# Patient Record
Sex: Male | Born: 1990 | Race: Black or African American | Hispanic: No | Marital: Single | Smoking: Current every day smoker
Health system: Southern US, Community
[De-identification: ages and names within clinical notes are randomized; demographics above are authoritative.]

## PROBLEM LIST (undated history)

## (undated) ENCOUNTER — Emergency Department (HOSPITAL_COMMUNITY): Admission: EM | Payer: Medicaid Other | Source: Home / Self Care

## (undated) DIAGNOSIS — E119 Type 2 diabetes mellitus without complications: Secondary | ICD-10-CM

## (undated) DIAGNOSIS — Z5900 Homelessness unspecified: Secondary | ICD-10-CM

## (undated) DIAGNOSIS — F32A Depression, unspecified: Secondary | ICD-10-CM

## (undated) DIAGNOSIS — F329 Major depressive disorder, single episode, unspecified: Secondary | ICD-10-CM

## (undated) DIAGNOSIS — F209 Schizophrenia, unspecified: Secondary | ICD-10-CM

## (undated) DIAGNOSIS — I10 Essential (primary) hypertension: Secondary | ICD-10-CM

## (undated) DIAGNOSIS — F419 Anxiety disorder, unspecified: Secondary | ICD-10-CM

---

## 2015-11-19 ENCOUNTER — Emergency Department
Admission: EM | Admit: 2015-11-19 | Discharge: 2015-11-19 | Disposition: A | Discharge status: Home / Self Care | Payer: Medicaid Other | Attending: Emergency Medicine | Admitting: Emergency Medicine

## 2015-11-19 ENCOUNTER — Encounter: Payer: Self-pay | Admitting: Emergency Medicine

## 2015-11-19 DIAGNOSIS — Z0000 Encounter for general adult medical examination without abnormal findings: Secondary | ICD-10-CM | POA: Diagnosis not present

## 2015-11-19 DIAGNOSIS — F329 Major depressive disorder, single episode, unspecified: Secondary | ICD-10-CM | POA: Insufficient documentation

## 2015-11-19 DIAGNOSIS — Z5181 Encounter for therapeutic drug level monitoring: Secondary | ICD-10-CM | POA: Diagnosis not present

## 2015-11-19 DIAGNOSIS — F17200 Nicotine dependence, unspecified, uncomplicated: Secondary | ICD-10-CM | POA: Diagnosis not present

## 2015-11-19 DIAGNOSIS — Z139 Encounter for screening, unspecified: Secondary | ICD-10-CM

## 2015-11-19 DIAGNOSIS — R45851 Suicidal ideations: Secondary | ICD-10-CM | POA: Diagnosis present

## 2015-11-19 HISTORY — DX: Depression, unspecified: F32.A

## 2015-11-19 HISTORY — DX: Major depressive disorder, single episode, unspecified: F32.9

## 2015-11-19 LAB — URINE DRUG SCREEN, QUALITATIVE (ARMC ONLY)
Amphetamines, Ur Screen: NOT DETECTED
BARBITURATES, UR SCREEN: NOT DETECTED
BENZODIAZEPINE, UR SCRN: NOT DETECTED
CANNABINOID 50 NG, UR ~~LOC~~: NOT DETECTED
COCAINE METABOLITE, UR ~~LOC~~: NOT DETECTED
MDMA (Ecstasy)Ur Screen: NOT DETECTED
METHADONE SCREEN, URINE: NOT DETECTED
Opiate, Ur Screen: NOT DETECTED
Phencyclidine (PCP) Ur S: NOT DETECTED
TRICYCLIC, UR SCREEN: NOT DETECTED

## 2015-11-19 LAB — ETHANOL: Alcohol, Ethyl (B): <5 mg/dL (ref <5)

## 2015-11-19 LAB — CBC
HCT: 45.7 % (ref 40.0–52.0)
HEMOGLOBIN: 15.3 g/dL (ref 13.0–18.0)
MCH: 23.8 pg — AB (ref 26.0–34.0)
MCHC: 33.4 g/dL (ref 32.0–36.0)
MCV: 71.3 fL — AB (ref 80.0–100.0)
Platelets: 232 10*3/uL (ref 150–440)
RBC: 6.41 MIL/uL — AB (ref 4.40–5.90)
RDW: 15.2 % — ABNORMAL HIGH (ref 11.5–14.5)
WBC: 8.1 10*3/uL (ref 3.8–10.6)

## 2015-11-19 LAB — COMPREHENSIVE METABOLIC PANEL
ALBUMIN: 4.3 g/dL (ref 3.5–5.0)
ALT: 43 U/L (ref 17–63)
AST: 37 U/L (ref 15–41)
Alkaline Phosphatase: 43 U/L (ref 38–126)
Anion gap: 9 (ref 5–15)
BUN: 18 mg/dL (ref 6–20)
CHLORIDE: 102 mmol/L (ref 101–111)
CO2: 25 mmol/L (ref 22–32)
CREATININE: 0.82 mg/dL (ref 0.61–1.24)
Calcium: 9.5 mg/dL (ref 8.9–10.3)
GFR calc Af Amer: >60 mL/min (ref >60)
GFR calc non Af Amer: >60 mL/min (ref >60)
Glucose, Bld: 99 mg/dL (ref 65–99)
Potassium: 3.9 mmol/L (ref 3.5–5.1)
SODIUM: 136 mmol/L (ref 135–145)
Total Bilirubin: 0.6 mg/dL (ref 0.3–1.2)
Total Protein: 7.6 g/dL (ref 6.5–8.1)

## 2015-11-19 LAB — SALICYLATE LEVEL: Salicylate Lvl: <4 mg/dL (ref 2.8–30.0)

## 2015-11-19 LAB — ACETAMINOPHEN LEVEL: Acetaminophen (Tylenol), Serum: <10 ug/mL — ABNORMAL LOW (ref 10–30)

## 2015-11-19 NOTE — ED Notes (Signed)
Karn PicklerSenetha Jones from patient's group home is here to see if patient is going to be d/c soon enough so she could just take him home now.  We told her the social work consult could vary in the amount of time it takes and she would be better to go home instead of waiting.  She also said he has been a resident there since June 19th, not yesterday like the patient told MD and myself.  MD was present and told this information.

## 2015-11-19 NOTE — ED Notes (Signed)

## 2015-11-19 NOTE — ED Notes (Signed)
SOC set up and room ED21 and patient moved in there for the consult.

## 2015-11-19 NOTE — ED Notes (Signed)

## 2015-11-19 NOTE — ED Notes (Signed)
Patient's group home was called, they should be here in 30 minutes.

## 2015-11-19 NOTE — ED Notes (Signed)
Case worker at bedside.

## 2015-11-19 NOTE — ED Notes (Signed)
Patient dressing back into his clothes and waiting on his bed for his transportation to arrive.

## 2015-11-19 NOTE — ED Notes (Signed)
Gave patient meal bag

## 2015-11-19 NOTE — ED Notes (Signed)
SOC complete and patient went back to bed [facility]

## 2015-11-19 NOTE — ED Notes (Signed)
MD at bedside.  Patient states he was just placed in [facility] last night and he does not like it there.  He states he feels it is mostly older people and he does not like that.  Pt denies SI/HI and is behavior appropriate during initial encounter.

## 2015-11-19 NOTE — BH Assessment (Signed)
Assessment Note  John SellsJoshua Copeland is an 25 y.o. male who presents to the ER, due to calling 911 and telling them he was suicidal. He admits it was lie and he only said it, so he can leave the group home. He states, he moved into the home approximately month ago and don't like it. The staff haven't done anything to him that would cause him not to like it. He just prefer the previous group home, over the current one.  He reports of having being hospitalized in the past, due to similar complaint. He acknowledge it was a lie as well. "I just wanted to get out of the assisted living home."  Patient currently denies SI/HI and AV/H.  Past Medical History:  Past Medical History  Diagnosis Date  . Depression     History reviewed. No pertinent past surgical history.  Family History: No family history on file.  Social History:  reports that he has been smoking.  He does not have any smokeless tobacco history on file. He reports that he does not drink alcohol or use illicit drugs.  Additional Social History:  Alcohol / Drug Use Pain Medications: See PTA Prescriptions: See PTA Over the Counter: See PTA History of alcohol / drug use?: No history of alcohol / drug abuse Longest period of sobriety (when/how long): No past or current use of mind altering substances Negative Consequences of Use:  (No past or current use) Withdrawal Symptoms:  (No past or current use)  CIWA: CIWA-Ar BP: 128/61 mmHg Pulse Rate: 96 COWS:    Allergies: No Known Allergies  Home Medications:  (Not in a hospital admission)  OB/GYN Status:  No LMP for male patient.  General Assessment Data [facility] TTS Assessment: In system Is this a Tele or Face-to-Face Assessment?: Face-to-Face Is this an Initial Assessment or a Re-assessment for this encounter?: Initial Assessment Marital status: Single Maiden name: n/a Is patient pregnant?: No Pregnancy Status: No Living Arrangements: Group Home ([facility]) Can pt return to current living arrangement?: Yes Admission Status: Voluntary Is patient capable of signing voluntary admission?: Yes Referral Source: Self/Family/Friend Insurance type: Medicaid  Medical Screening Exam [facility] ONLY) Medical Exam completed: Yes  Crisis Care Plan Living Arrangements: Group Home ([facility]) Legal Guardian: Other: [facility]([place] [facility]) Name of Psychiatrist: "I don't remember the name" Name of Therapist: "I don't remember the name"  Education Status Is patient currently in school?: No Current Grade: n/a Highest grade of school patient has completed: 9th Grade Name of school: n/a Contact person: n/a  Risk to self with the past 6 months Suicidal Ideation: No Has patient been a risk to self within the past 6 months prior to admission? : No Suicidal Intent: No Has patient had any suicidal intent within the past 6 months prior to admission? : No Is patient at risk for suicide?: No Suicidal Plan?: No Has patient had any suicidal plan within the past 6 months prior to admission? : No Access to Means: No What has been your use of drugs/alcohol within the last 12 months?: Reports of none Previous Attempts/Gestures: No How many times?: 0 Other Self Harm Risks: Reports of none Triggers for Past Attempts: None known Intentional Self Injurious Behavior: None Family Suicide History: No Recent stressful life event(s): Other (Comment) (Reports of none) Persecutory voices/beliefs?: No Depression: No Depression Symptoms:  (Reports of none) Substance abuse history and/or treatment for substance abuse?: No Suicide prevention information given to non-admitted patients:  Not applicable  Risk to Others within the past 6 months Homicidal Ideation: No Does patient have any lifetime risk of violence toward others beyond the six months prior to admission? : No Thoughts of Harm to Others: No Current Homicidal Intent:  No Current Homicidal Plan: No Access to Homicidal Means: No Identified Victim: Reports of none History of harm to others?: No Assessment of Violence: None Noted Violent Behavior Description: Reports of none Does patient have access to weapons?: No Criminal Charges Pending?: No Does patient have a court date: No Is patient on probation?: No  Psychosis Hallucinations: None noted Delusions: None noted  Mental Status Report Appearance/Hygiene: In scrubs, Unremarkable, In hospital gown Eye Contact: Good Motor Activity: Freedom of movement, Unremarkable Speech: Logical/coherent, Unremarkable Level of Consciousness: Alert Mood: Pleasant Affect: Appropriate to circumstance Anxiety Level: None Thought Processes: Coherent, Relevant Judgement: Unimpaired Orientation: Person, Time, Place, Situation, Appropriate for developmental age Obsessive Compulsive Thoughts/Behaviors: Minimal  Cognitive Functioning Concentration: Normal Memory: Recent Intact, Remote Intact IQ: Average Insight: Fair Impulse Control: Fair Appetite: Good Weight Loss: 0 Weight Gain: 0 Sleep: No Change Total Hours of Sleep: 8 Vegetative Symptoms: None  ADLScreening [facility]) Patient's cognitive ability adequate to safely complete daily activities?: Yes Patient able to express need for assistance with ADLs?: No Independently performs ADLs?: Yes (appropriate for developmental age)  Prior Inpatient Therapy Prior Inpatient Therapy: Yes Prior Therapy Dates: 2016 Prior Therapy Facilty/Provider(s): [facility] Reason for Treatment: "I lied and said I wanted to kill myself so I can get out of the assisted living home."  Prior Outpatient Therapy Prior Outpatient Therapy: Yes Prior Therapy Dates: "I don't remember" Prior Therapy Facilty/Provider(s): "I don't remember" Reason for Treatment: "I don't remember" Does patient have an ACCT team?: No Does patient have Intensive In-House Services?   : No Does patient have [facility] services? : No Does patient have P4CC services?: No  ADL Screening (condition at time of admission) Patient's cognitive ability adequate to safely complete daily activities?: Yes Is the patient deaf or have difficulty hearing?: No Does the patient have difficulty seeing, even when wearing glasses/contacts?: No Does the patient have difficulty concentrating, remembering, or making decisions?: No Patient able to express need for assistance with ADLs?: No Does the patient have difficulty dressing or bathing?: Yes Independently performs ADLs?: Yes (appropriate for developmental age) Weakness of Legs: None Weakness of Arms/Hands: None  Home Assistive Devices/Equipment Home Assistive Devices/Equipment: None  Therapy Consults (therapy consults require a physician order) PT Evaluation Needed: No OT Evalulation Needed: No SLP Evaluation Needed: No Abuse/Neglect Assessment (Assessment to be complete while patient is alone) Physical Abuse: Denies Verbal Abuse: Denies Sexual Abuse: Denies Exploitation of patient/patient's resources: Denies Self-Neglect: Denies Values / Beliefs Cultural Requests During Hospitalization: None Spiritual Requests During Hospitalization: None Consults Spiritual Care Consult Needed: No Social Work Consult Needed: No      Additional Information 1:1 In Past 12 Months?: No CIRT Risk: No Elopement Risk: No Does patient have medical clearance?: Yes  Child/Adolescent Assessment Running Away Risk: Denies (Patient is an adult)  Disposition:  Disposition Initial Assessment Completed for this Encounter: Yes Disposition of Patient: Other Pensions consultant discussed patient with ER MD (Dr. Pershing Proud). Patient will discharge back to Group Home and follow up with his current outpatient provider.   On Site Evaluation by:   Reviewed with Physician:    Lilyan Gilford MS, LCAS, LPC, NCC, CCSI Therapeutic Triage  Specialist 11/19/2015 9:10 PM

## 2015-11-19 NOTE — ED Notes (Signed)
Patient given meal from dietary, no straws or utensils found. 

## 2015-11-19 NOTE — ED Provider Notes (Signed)
[facility] Emergency Department Provider Note   ____________________________________________  Time seen: Approximately 3:20 PM  I have reviewed the triage vital signs and the nursing notes.   HISTORY  Chief Complaint Suicidal   HPI John Copeland is a 25 y.o. male with a history of depression who is presenting to the emergency department today requesting to return to his own group home. His initial chief complaint was for suicidal ideation. However, when I asked the patient what is bringing him to the emergency department today he says that he is not suicidal. Denies any plan. Denies homicidal ideation. Has no medical complaints at this time. He says that he was just switched to a new group home yesterday and he says he doesn't like it because there are too many old people there. He says that he would like to return to his old group home and says he has already spoken to the owner of the new group home about this.Denies any hallucinations. Denies any substance abuse.   Past Medical History  Diagnosis Date  . Depression     There are no active problems to display for this patient.   History reviewed. No pertinent past surgical history.  No current outpatient prescriptions on file.  Allergies Review of patient's allergies indicates no known allergies.  No family history on file.  Social History Social History  Substance Use Topics  . Smoking status: Current Every Day Smoker  . Smokeless tobacco: None  . Alcohol Use: No    Review of Systems Constitutional: No fever/chills Eyes: No visual changes. ENT: No sore throat. Cardiovascular: Denies chest pain. Respiratory: Denies shortness of breath. Gastrointestinal: No abdominal pain.  No nausea, no vomiting.  No diarrhea.  No constipation. Genitourinary: Negative for dysuria. Musculoskeletal: Negative for back pain. Skin: Negative for rash. Neurological: Negative for headaches, focal weakness  or numbness.  10-point ROS otherwise negative.  ____________________________________________   PHYSICAL EXAM:  VITAL SIGNS: ED Triage Vitals  Enc Vitals Group     BP 11/19/15 1445 128/61 mmHg     Pulse Rate 11/19/15 1445 96     Resp 11/19/15 1445 18     Temp 11/19/15 1445 98 F (36.7 C)     Temp Source 11/19/15 1445 Oral     SpO2 11/19/15 1445 98 %     Weight 11/19/15 1445 235 lb (106.595 kg)     Height 11/19/15 1445 5\' 11"  (1.803 m)     Head Cir --      Peak Flow --      Pain Score 11/19/15 1439 0     Pain Loc --      Pain Edu? --      Excl. in GC? --     Constitutional: Alert and oriented. Well appearing and in no acute distress. Eyes: Conjunctivae are normal. PERRL. EOMI. Head: Atraumatic. Nose: No congestion/rhinnorhea. Mouth/Throat: Mucous membranes are moist.  Neck: No stridor.   Cardiovascular: Normal rate, regular rhythm. Grossly normal heart sounds.   Respiratory: Normal respiratory effort.  No retractions. Lungs CTAB. Gastrointestinal: Soft and nontender. No distention. no CVA tenderness. Musculoskeletal: No lower extremity tenderness nor edema.  No joint effusions. Neurologic:  Normal speech and language. No gross focal neurologic deficits are appreciated. Skin:  Skin is warm, dry and intact. No rash noted. Psychiatric: Mood and affect are normal. Speech and behavior are normal.  ____________________________________________   LABS (all labs ordered are listed, but only abnormal results are displayed)  Labs Reviewed  ACETAMINOPHEN LEVEL - Abnormal; Notable for the following:    Acetaminophen (Tylenol), Serum <10 (*)    All other components within normal limits  CBC - Abnormal; Notable for the following:    RBC 6.41 (*)    MCV 71.3 (*)    MCH 23.8 (*)    RDW 15.2 (*)    All other components within normal limits  COMPREHENSIVE METABOLIC PANEL  ETHANOL  SALICYLATE LEVEL  URINE DRUG SCREEN, QUALITATIVE ([facility] ONLY)    ____________________________________________  EKG   ____________________________________________  RADIOLOGY   ____________________________________________   PROCEDURES   Procedures  ____________________________________________   INITIAL IMPRESSION / ASSESSMENT AND PLAN / ED COURSE  Pertinent labs & imaging results that were available during my care of the patient were reviewed by me and considered in my medical decision making (see chart for details).  The patient is calm, reasonably and clinically sober. I will not involuntarily commit him. He does not appear to be in a state of psychosis. Discussed the case with Olivia Mackie, the behavioral health intake, who recommends discussing case with social work. Social work consult ordered.  ----------------------------------------- 9:31 PM on 11/19/2015 -----------------------------------------  Patient resting comfortably and has been evaluated by the specialist on call who recommends discharge to home without need for any psychiatric workup further in the emergency department. Also seen by TTS arrange transport back to the patient's group home. I discussed this with the patient that we cannot change his group home placement for the emergency department. He is understanding of this. He denies any suicidal or homicidal ideation at this time. He understands that he must continue this conversation with the group home staff that he would like to continue to try to switch his placement. ____________________________________________   FINAL CLINICAL IMPRESSION(S) / ED DIAGNOSES  Medical screening exam.    NEW MEDICATIONS STARTED DURING THIS VISIT:  New Prescriptions   No medications on file     Note:  This document was prepared using Dragon voice recognition software and may include unintentional dictation errors.    Myrna Blazer, MD 11/19/15 [phone]

## 2015-11-19 NOTE — ED Notes (Signed)
Pt to ed with c/o suicidal thought since yesterday.  Denies plan.  Denies HI.  Reports hx of admission for same.

## 2015-11-19 NOTE — ED Notes (Signed)
Patients group home called and asked how much longer until this pt is d/c.  I told them the recommendations so far were to d/c him tonight but that I am unable to provide a time.  I was given a contact named Tobi Bastosnna to call @ [phone] when d/c was near.

## 2015-11-19 NOTE — BH Assessment (Signed)
Writer contacted Group Home Owner [phone]([phone]) and they are aware patient is up for discharge. Writer informed ER MD (Dr. Pershing ProudSchaevitz) and patient's nurse Onalee Hua(David) they will pick him up within 30 minutes.

## 2015-11-19 NOTE — ED Notes (Signed)
Patient transportation has arrived, escorting him up front.

## 2015-11-29 ENCOUNTER — Emergency Department
Admission: EM | Admit: 2015-11-29 | Discharge: 2015-11-30 | Disposition: A | Discharge status: Home / Self Care | Payer: Medicaid Other | Attending: Emergency Medicine | Admitting: Emergency Medicine

## 2015-11-29 ENCOUNTER — Encounter: Payer: Self-pay | Admitting: Emergency Medicine

## 2015-11-29 DIAGNOSIS — F17210 Nicotine dependence, cigarettes, uncomplicated: Secondary | ICD-10-CM | POA: Insufficient documentation

## 2015-11-29 DIAGNOSIS — R45851 Suicidal ideations: Secondary | ICD-10-CM | POA: Diagnosis present

## 2015-11-29 DIAGNOSIS — F250 Schizoaffective disorder, bipolar type: Secondary | ICD-10-CM | POA: Diagnosis not present

## 2015-11-29 DIAGNOSIS — F329 Major depressive disorder, single episode, unspecified: Secondary | ICD-10-CM | POA: Insufficient documentation

## 2015-11-29 DIAGNOSIS — E119 Type 2 diabetes mellitus without complications: Secondary | ICD-10-CM | POA: Diagnosis not present

## 2015-11-29 DIAGNOSIS — Z79899 Other long term (current) drug therapy: Secondary | ICD-10-CM | POA: Diagnosis not present

## 2015-11-29 HISTORY — DX: Type 2 diabetes mellitus without complications: E11.9

## 2015-11-29 LAB — COMPREHENSIVE METABOLIC PANEL
ALK PHOS: 38 U/L (ref 38–126)
ALT: 31 U/L (ref 17–63)
ANION GAP: 6 (ref 5–15)
AST: 23 U/L (ref 15–41)
Albumin: 3.9 g/dL (ref 3.5–5.0)
BILIRUBIN TOTAL: 0.3 mg/dL (ref 0.3–1.2)
BUN: 16 mg/dL (ref 6–20)
CALCIUM: 9.1 mg/dL (ref 8.9–10.3)
CO2: 26 mmol/L (ref 22–32)
Chloride: 105 mmol/L (ref 101–111)
Creatinine, Ser: 0.72 mg/dL (ref 0.61–1.24)
GFR calc non Af Amer: >60 mL/min (ref >60)
Glucose, Bld: 120 mg/dL — ABNORMAL HIGH (ref 65–99)
Potassium: 3.9 mmol/L (ref 3.5–5.1)
Sodium: 137 mmol/L (ref 135–145)
TOTAL PROTEIN: 7.1 g/dL (ref 6.5–8.1)

## 2015-11-29 LAB — URINE DRUG SCREEN, QUALITATIVE (ARMC ONLY)
Amphetamines, Ur Screen: NOT DETECTED
BARBITURATES, UR SCREEN: NOT DETECTED
BENZODIAZEPINE, UR SCRN: NOT DETECTED
CANNABINOID 50 NG, UR ~~LOC~~: NOT DETECTED
Cocaine Metabolite,Ur ~~LOC~~: NOT DETECTED
MDMA (Ecstasy)Ur Screen: NOT DETECTED
METHADONE SCREEN, URINE: NOT DETECTED
OPIATE, UR SCREEN: NOT DETECTED
Phencyclidine (PCP) Ur S: NOT DETECTED
TRICYCLIC, UR SCREEN: NOT DETECTED

## 2015-11-29 LAB — CBC
HEMATOCRIT: 44.2 % (ref 40.0–52.0)
Hemoglobin: 14.5 g/dL (ref 13.0–18.0)
MCH: 24 pg — ABNORMAL LOW (ref 26.0–34.0)
MCHC: 32.7 g/dL (ref 32.0–36.0)
MCV: 73.4 fL — ABNORMAL LOW (ref 80.0–100.0)
Platelets: 224 10*3/uL (ref 150–440)
RBC: 6.03 MIL/uL — ABNORMAL HIGH (ref 4.40–5.90)
RDW: 15.4 % — AB (ref 11.5–14.5)
WBC: 8.9 10*3/uL (ref 3.8–10.6)

## 2015-11-29 LAB — ACETAMINOPHEN LEVEL

## 2015-11-29 LAB — ETHANOL: Alcohol, Ethyl (B): <5 mg/dL (ref <5)

## 2015-11-29 LAB — SALICYLATE LEVEL

## 2015-11-29 NOTE — ED Notes (Signed)
Labs and ua sent to lab.  Pt provided a meal tray and drink.

## 2015-11-29 NOTE — ED Provider Notes (Signed)
[facility] Emergency Department Provider Note  ____________________________________________  Time seen: Approximately 8:24 PM  I have reviewed the triage vital signs and the nursing notes.   HISTORY  Chief Complaint Suicidal    HPI John Copeland is a 25 y.o. male who has a legal guardian and lives at a group home who presents for evaluation of suicidal ideation.  He is stating that he wants to kill himself and would do so by hanging with a belt.  He states that he feels this way because there is someone that works at a group home that is been giving him a hard time.  He presented with the same complaints of wanting to die about 10 days ago.  During that evaluation he admitted to the post on-call as well as to the intake counselor that he was lying so that he get a break from his group home.  He also stated that during at least one prior hospitalization for psychiatric reasons he was lying at that time as well and that he never intended to kill himself.  I pointed this out and tonight and he said "tonight is different, I mean it.".  Per the group home, he reportedly tried to run away a few days ago after having something not go his way.  The acting out behavior is apparently a chronic issue with him.  He denies fever/chills, chest pain, shortness of breath, nausea, vomiting, diarrhea, abdominal pain  Past Medical History  Diagnosis Date  . Depression   . Diabetes mellitus without complication (HCC)     There are no active problems to display for this patient.   History reviewed. No pertinent past surgical history.  Current Outpatient Rx  Name  Route  Sig  Dispense  Refill  . Canagliflozin-Metformin HCl (INVOKAMET) [phone] MG TABS   Oral   Take 1 tablet by mouth 2 (two) times daily.         . diphenhydrAMINE (BENADRYL) 25 MG tablet   Oral   Take 25 mg by mouth every 4 (four) hours as needed.         . divalproex (DEPAKOTE ER) 500 MG 24 hr  tablet   Oral   Take 1,500 mg by mouth at bedtime.         . haloperidol (HALDOL) 5 MG tablet   Oral   Take 5 mg by mouth at bedtime.         Marland Kitchen losartan (COZAAR) 50 MG tablet   Oral   Take 50 mg by mouth daily.         . Multiple Vitamin (THEREMS PO)   Oral   Take 1 tablet by mouth daily.         . pravastatin (PRAVACHOL) 40 MG tablet   Oral   Take 40 mg by mouth daily.         . Vitamin D, Ergocalciferol, (DRISDOL) 50000 units CAPS capsule   Oral   Take 50,000 Units by mouth every 7 (seven) days.           Allergies Review of patient's allergies indicates no known allergies.  No family history on file.  Social History Social History  Substance Use Topics  . Smoking status: Current Every Day Smoker -- 0.50 packs/day    Types: Cigarettes  . Smokeless tobacco: None  . Alcohol Use: No    Review of Systems Constitutional: No fever/chills Eyes: No visual changes. ENT: No sore throat. Cardiovascular: Denies chest pain. Respiratory:  Denies shortness of breath. Gastrointestinal: No abdominal pain.  No nausea, no vomiting.  No diarrhea.  No constipation. Genitourinary: Negative for dysuria. Musculoskeletal: Negative for back pain. Skin: Negative for rash. Neurological: Negative for headaches, focal weakness or numbness. Psych:  Suicidal ideation with a plan (pain by belt) 10-point ROS otherwise negative.  ____________________________________________   PHYSICAL EXAM:  VITAL SIGNS: ED Triage Vitals  Enc Vitals Group     BP 11/29/15 1859 143/80 mmHg     Pulse Rate 11/29/15 1859 93     Resp 11/29/15 1859 16     Temp 11/29/15 1859 98.7 F (37.1 C)     Temp Source 11/29/15 1859 Oral     SpO2 11/29/15 1859 97 %     Weight 11/29/15 1859 232 lb (105.235 kg)     Height 11/29/15 1859 5\' 11"  (1.803 m)     Head Cir --      Peak Flow --      Pain Score 11/29/15 1900 0     Pain Loc --      Pain Edu? --      Excl. in GC? --     Constitutional: Alert  and oriented. Well appearing and in no acute distress. Eyes: Conjunctivae are normal. PERRL. EOMI. Head: Atraumatic. Nose: No congestion/rhinnorhea. Mouth/Throat: Mucous membranes are moist.  Oropharynx non-erythematous. Neck: No stridor.  No meningeal signs.   Cardiovascular: Normal rate, regular rhythm. Good peripheral circulation. Grossly normal heart sounds.   Respiratory: Normal respiratory effort.  No retractions. Lungs CTAB. Gastrointestinal: Soft and nontender. No distention.  Musculoskeletal: No lower extremity tenderness nor edema. No gross deformities of extremities. Neurologic:  Normal speech and language. No gross focal neurologic deficits are appreciated.  Skin:  Skin is warm, dry and intact. No rash noted. Psychiatric: Mood and affect are normal. Speech is normal.  He endorses active suicidality at the plan of hanging by belt.  He chronically has limited insight and judgment and a limited ability to make decisions.  ____________________________________________   LABS (all labs ordered are listed, but only abnormal results are displayed)  Labs Reviewed  COMPREHENSIVE METABOLIC PANEL - Abnormal; Notable for the following:    Glucose, Bld 120 (*)    All other components within normal limits  ACETAMINOPHEN LEVEL - Abnormal; Notable for the following:    Acetaminophen (Tylenol), Serum <10 (*)    All other components within normal limits  CBC - Abnormal; Notable for the following:    RBC 6.03 (*)    MCV 73.4 (*)    MCH 24.0 (*)    RDW 15.4 (*)    All other components within normal limits  ETHANOL  SALICYLATE LEVEL  URINE DRUG SCREEN, QUALITATIVE ([facility] ONLY)   ____________________________________________  EKG  None ____________________________________________  RADIOLOGY   No results found.  ____________________________________________   PROCEDURES  Procedure(s) performed:   Procedures   ____________________________________________   INITIAL  IMPRESSION / ASSESSMENT AND PLAN / ED COURSE  Pertinent labs & imaging results that were available during my care of the patient were reviewed by me and considered in my medical decision making (see chart for details).  I strongly suspect the patient is once again acting out.  However we know that he has limited insight and judgment and he has the capacity to act out a potentially harm himself as demonstrated by him attempting to run away to [place] within the last week.  This may represent an escalation of his behavior.  At this time the  patient wants to be here and I will keep involuntary since he has been through this process several times in the past.  I will, however, consult TTS further evaluation as well.   ____________________________________________  FINAL CLINICAL IMPRESSION(S) / ED DIAGNOSES  Final diagnoses:  Suicidal ideation     MEDICATIONS GIVEN DURING THIS VISIT:  Medications - No data to display   NEW OUTPATIENT MEDICATIONS STARTED DURING THIS VISIT:  New Prescriptions   No medications on file      Note:  This document was prepared using Dragon voice recognition software and may include unintentional dictation errors.   Loleta Roseory Aldora Perman, MD 11/30/15 [phone]

## 2015-11-29 NOTE — ED Notes (Signed)
Pt in via EMS with suicidal ideation; pt states, "I want to kill myself."  When asked about plan, pt states, "Im going to hang myself with my belt."  Pt currently within sight of behavioral quad sitter and ODS.  Pt dressed out by Annette StableBill, RN, all personal belongings placed in pt belonging bags and are currently at nurses station.   Pt A/Ox4, vitals WDL, no immediate distress at this time.

## 2015-11-29 NOTE — ED Notes (Signed)
Attempted to contact pt legal guardian, Jennette BankerMarty Purington to notify of pt whereabouts and to give him password.  No answer, voicemail left at this time to give us a call at his earliest convenience.

## 2015-11-29 NOTE — ED Notes (Signed)
John Copeland, Interior and spatial designerdirector of [facility] here to check on pt.  Mr John PateeRichmond reports that pt has hx of similar behavior as well as elopement when things do not go his way.  Reported that today pt was upset in regards to the specific outing that was scheduled today because he wanted to do go else where.

## 2015-11-29 NOTE — BH Assessment (Signed)
Assessment Note  John SellsJoshua Copeland is an 25 y.o. male. [name] arrived to the ED by way of EMS. He reports that "I want to kill myself".  He states that he has been feeling this way all day.  He states that for the last few days he has been feeling like going through with it.  He reports that he would hang himself with his belt from a tree.  He reports a history of suicidal attempts(once before).  He denied having homicidal ideation or intent.  He reports symptoms of depression.  He shared that he feels bad when things are not going his way.  He reports that he has been sleeping more, isolating himself, and has not been finding enjoyment in things he usually enjoys.  He reports that he has been feeing anxious about getting a job and going to school.  He denied having auditory or visual hallucinations.  He reports stress from group home staff, and being disrespected by staff.  He denied the use of alcohol or drugs.  Legal guardian John HamburgerMarty Copeland - [place] [facility] [phone].  Diagnosis: SI  Past Medical History:  Past Medical History  Diagnosis Date  . Depression   . Diabetes mellitus without complication (HCC)     History reviewed. No pertinent past surgical history.  Family History: No family history on file.  Social History:  reports that he has been smoking Cigarettes.  He has been smoking about 0.50 packs per day. He does not have any smokeless tobacco history on file. He reports that he does not drink alcohol or use illicit drugs.  Additional Social History:  Alcohol / Drug Use History of alcohol / drug use?: No history of alcohol / drug abuse  CIWA: CIWA-Ar BP: (!) 143/80 mmHg Pulse Rate: 93 COWS:    Allergies: No Known Allergies  Home Medications:  (Not in a hospital admission)  OB/GYN Status:  No LMP for male patient.  General Assessment Data [facility] Assessment: In system Is this a Tele or Face-to-Face Assessment?: Face-to-Face Is this an  Initial Assessment or a Re-assessment for this encounter?: Initial Assessment Marital status: Single Maiden name: n/a Is patient pregnant?: No Pregnancy Status: No Living Arrangements: Group Home ([facility]) Can pt return to current living arrangement?: Yes Admission Status: Voluntary Is patient capable of signing voluntary admission?: No Referral Source: Self/Family/Friend Insurance type: Medicaid  Medical Screening Exam [facility] Walk-[facility] ONLY) Medical Exam completed: Yes  Crisis Care Plan Living Arrangements: Group Home ([facility]) Legal Guardian: Other: John Copeland(John Copeland - [place] [facility] [phone]) Name of Psychiatrist: "I don't know his name" (he denied knowing the name of the agency as well) Name of Therapist: "I don't know, I'm not sure"  Education Status Is patient currently in school?: No Current Grade: n/a Highest grade of school patient has completed: 8th Name of school: [employer] Contact person: n/a  Risk to self with the past 6 months Suicidal Ideation: Yes-Currently Present Has patient been a risk to self within the past 6 months prior to admission? : Yes Suicidal Intent: Yes-Currently Present Has patient had any suicidal intent within the past 6 months prior to admission? : Yes Is patient at risk for suicide?: No Suicidal Plan?: Yes-Currently Present Has patient had any suicidal plan within the past 6 months prior to admission? : Yes Specify Current Suicidal Plan: Sherri RadHang himself with his belt from a tree Access to Means: No (Currently in the  hospital) What has been your use of drugs/alcohol within the last 12 months?: denied use Previous Attempts/Gestures: Yes How many times?: 1 Other Self Harm Risks: denied Triggers for Past Attempts: Unknown Intentional Self Injurious Behavior: None Family Suicide History: No Recent stressful life event(s): Other (Comment) (Problems with group home  staff) Persecutory voices/beliefs?: No Depression: Yes Depression Symptoms: Isolating Substance abuse history and/or treatment for substance abuse?: No Suicide prevention information given to non-admitted patients: Not applicable  Risk to Others within the past 6 months Homicidal Ideation: No Does patient have any lifetime risk of violence toward others beyond the six months prior to admission? : No Thoughts of Harm to Others: No Current Homicidal Intent: No Current Homicidal Plan: No Access to Homicidal Means: No Identified Victim: None identified History of harm to others?: No Assessment of Violence: None Noted Violent Behavior Description: denied Does patient have access to weapons?: No Criminal Charges Pending?: No Does patient have a court date: No Is patient on probation?: No  Psychosis Hallucinations: None noted Delusions: None noted  Mental Status Report Appearance/Hygiene: In scrubs, Unremarkable Eye Contact: Fair Motor Activity: Unremarkable Speech: Logical/coherent Level of Consciousness: Alert Mood: Pleasant Affect: Appropriate to circumstance Anxiety Level: None Thought Processes: Coherent Judgement: Unimpaired Orientation: Person, Place, Time, Situation Obsessive Compulsive Thoughts/Behaviors: None  Cognitive Functioning Concentration: Normal Memory: Recent Intact IQ: Average Insight: Poor Impulse Control: Fair Appetite: Good Sleep: Increased Vegetative Symptoms: None  ADLScreening [facility] Assessment Services) Patient's cognitive ability adequate to safely complete daily activities?: Yes Patient able to express need for assistance with ADLs?: Yes Independently performs ADLs?: Yes (appropriate for developmental age)  Prior Inpatient Therapy Prior Inpatient Therapy: Yes Prior Therapy Dates: 2016 Prior Therapy Facilty/Provider(s): [facility], [facility] Reason for Treatment: Overdose, suicidal thoughts  Prior Outpatient Therapy Prior Outpatient  Therapy: Yes Prior Therapy Dates: Current Prior Therapy Facilty/Provider(s): -Could not remember the name of the psychiatrist Reason for Treatment: Depression, SI Does patient have an ACCT team?: No Does patient have Intensive In-House Services?  : No Does patient have [facility] services? : No Does patient have P4CC services?: No  ADL Screening (condition at time of admission) Patient's cognitive ability adequate to safely complete daily activities?: Yes Patient able to express need for assistance with ADLs?: Yes Independently performs ADLs?: Yes (appropriate for developmental age)       Abuse/Neglect Assessment (Assessment to be complete while patient is alone) Physical Abuse: Denies Verbal Abuse: Denies Sexual Abuse: Denies Exploitation of patient/patient's resources: Denies Self-Neglect: Denies     [employer]chant [employer] officer (For Healthcare) Does patient have an advance directive?: No Would patient like information on creating an advanced directive?: No - patient declined information    Additional Information 1:1 In Past 12 Months?: No CIRT Risk: No Elopement Risk: No Does patient have medical clearance?: Yes     Disposition:  Disposition Initial Assessment Completed for this Encounter: Yes Disposition of Patient: Other dispositions  On Site Evaluation by:   Reviewed with Physician:    Justice Deeds 11/29/2015 10:13 PM

## 2015-11-30 DIAGNOSIS — F250 Schizoaffective disorder, bipolar type: Secondary | ICD-10-CM
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
## 2015-11-30 NOTE — ED Notes (Signed)
Patient resting quietly in room. No noted distress or abnormal behaviors noted. Will continue 15 minute checks and observation by security camera for safety. 
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
## 2015-11-30 NOTE — ED Notes (Signed)
Patient guardian John Copeland [phone]) called asking about an update of the patient's status. He also mentioned that the patient frequently calls [phone] voicing suicidal ideation at his current group home and that he will be able to return to the home.

## 2015-11-30 NOTE — ED Notes (Signed)
Patient denies SI/HI/AVH and pain. ALl belongings returned to patient. Discharge instructions reviewed with patient. Patient discharged in care of group home.

## 2015-11-30 NOTE — Discharge Instructions (Signed)
Please seek medical attention and help for any thoughts about wanting to harm herself, harm others, any concerning change in behavior, severe depression, inappropriate drug use or any other new or concerning symptoms. ° °

## 2015-11-30 NOTE — ED Notes (Signed)
Patient asleep in room. No noted distress or abnormal behavior. Will continue 15 minute checks and observation by security cameras for safety. 

## 2015-11-30 NOTE — ED Notes (Signed)

## 2015-11-30 NOTE — ED Notes (Signed)
Patient came to nurse and asked if he could speak to a Child psychotherapistsocial worker. After speaking with his guardian, he decided that he wanted to switch guardians because he had to go back to the same group home. Nurse asked social worker to speak with patient. Awaiting social work recommendations.

## 2015-11-30 NOTE — ED Notes (Signed)
Patient currently in dayroom speaking with DSS representative. No signs of acute distress noted at this time. Maintained on 15 minute checks and observation by security camera for safety.

## 2015-11-30 NOTE — Consult Note (Signed)
[facility] Consult   Reason for Consult:  Consult for 25 year old man who had himself brought voluntarily to the emergency room stating that he was having suicidal thoughts and wanted to get out of his group home. Referring Physician:  Cinda Quest Patient Identification: John Copeland MRN:  [id number] Principal Diagnosis: Schizoaffective disorder [facility]) Diagnosis:   Patient Active Problem List   Diagnosis Date Noted  . Schizoaffective disorder ([place]) [F25.9] 11/30/2015    Total Time spent with patient: 1 hour  Subjective:   John Copeland is a 25 y.o. male patient admitted with "you help me get a new place to live".  HPI:  Patient interviewed. Chart reviewed. Case reviewed with TTS and emergency room physician. 25 year old man came to the emergency room stating that he was having suicidal ideation. He says that his mood is feeling depressed. He blames his depression on things that he dislikes at his group home. He says that they don't treat him respectfully and he generally doesn't like it. He wants to be moved back into an assisted living facility. He says that he's been sleeping a lot. Eating normally. He denies any hallucinations or psychotic symptoms. He states that he is compliant with his outpatient medicine regularly. He did not actually act on any attempt to harm himself or harm anyone else. Denies that he's been abusing any drugs or alcohol.  Substance abuse history: Patient denies any current or past substance abuse history.  Social history: Patient is originally from the [place] area. He has a legal guardian. [place] to a group home here in our community. Patient says he dislikes the group home because it is boring and they are not meeting his needs of wanting to go to school and try to get a job.  Medical history: He denies any medical problems outside  psychiatric. Based on his medications he may have a history of dyslipidemia and high blood pressure.  Past Psychiatric History: Patient has had 1 previous visit recently to our emergency room. At that time he also presented with suicidal ideation but admitted that he was making up the story in order to get out of his group home. Patient claims that he has cut himself in the past. Denies any other suicide attempts. Has had psychiatric hospitalization but he says he doesn't remember the details. He knows he is taking Depakote currently but can't remember any other medication  Risk to Self: Suicidal Ideation: Yes-Currently Present Suicidal Intent: Yes-Currently Present Is patient at risk for suicide?: No Suicidal Plan?: Yes-Currently Present Specify Current Suicidal Plan: Elbert Ewings himself with his belt from a tree Access to Means: No (Currently in the hospital) What has been your use of drugs/alcohol within the last 12 months?: denied use How many times?: 1 Other Self Harm Risks: denied Triggers for Past Attempts: Unknown Intentional Self Injurious Behavior: None Risk to Others: Homicidal Ideation: No Thoughts of Harm to Others: No Current Homicidal Intent: No Current Homicidal Plan: No Access to Homicidal Means: No Identified Victim: None identified History of harm to others?: No Assessment of Violence: None Noted Violent Behavior Description: denied Does patient have access to weapons?: No Criminal Charges Pending?: No Does patient have a court date: No Prior Inpatient Therapy: Prior Inpatient Therapy: Yes Prior Therapy Dates: 2016 Prior Therapy Facilty/Provider(s): [facility], [facility] Reason for Treatment: Overdose, suicidal thoughts Prior Outpatient Therapy: Prior Outpatient Therapy: Yes Prior Therapy Dates:  Current Prior Therapy Facilty/Provider(s): -Could not remember the name of the psychiatrist Reason for Treatment: Depression, SI Does patient have an ACCT team?: No Does  patient have Intensive In-House Services?  : No Does patient have [facility] services? : No Does patient have P4CC services?: No  Past Medical History:  Past Medical History  Diagnosis Date  . Depression   . Diabetes mellitus without complication (HCC)    History reviewed. No pertinent past surgical history. Family History: No family history on file. Family Psychiatric  History: He denies any family history of mental illness Social History:  History  Alcohol Use No     History  Drug Use No    Social History   Social History  . Marital Status: Single    Spouse Name: N/A  . Number of Children: N/A  . Years of Education: N/A   Social History Main Topics  . Smoking status: Current Every Day Smoker -- 0.50 packs/day    Types: Cigarettes  . Smokeless tobacco: None  . Alcohol Use: No  . Drug Use: No  . Sexual Activity: Not Asked   Other Topics Concern  . None   Social History Narrative   Additional Social History:    Allergies:  No Known Allergies  Labs:  Results for orders placed or performed during the hospital encounter of 11/29/15 (from the past 48 hour(s))  Comprehensive metabolic panel     Status: Abnormal   Collection Time: 11/29/15  7:30 PM  Result Value Ref Range   Sodium 137 135 - 145 mmol/L   Potassium 3.9 3.5 - 5.1 mmol/L   Chloride 105 101 - 111 mmol/L   CO2 26 22 - 32 mmol/L   Glucose, Bld 120 (H) 65 - 99 mg/dL   BUN 16 6 - 20 mg/dL   Creatinine, Ser 9.65 0.61 - 1.24 mg/dL   Calcium 9.1 8.9 - 44.1 mg/dL   Total Protein 7.1 6.5 - 8.1 g/dL   Albumin 3.9 3.5 - 5.0 g/dL   AST 23 15 - 41 U/L   ALT 31 17 - 63 U/L   Alkaline Phosphatase 38 38 - 126 U/L   Total Bilirubin 0.3 0.3 - 1.2 mg/dL   GFR calc non Af Amer >60 >60 mL/min   GFR calc Af Amer >60 >60 mL/min    Comment: (NOTE) The eGFR has been calculated using the CKD EPI equation. This calculation has not been validated in all clinical situations. eGFR's persistently <60 mL/min signify possible  Chronic Kidney Disease.    Anion gap 6 5 - 15  Ethanol     Status: None   Collection Time: 11/29/15  7:30 PM  Result Value Ref Range   Alcohol, Ethyl (B) <5 <5 mg/dL    Comment:        LOWEST DETECTABLE LIMIT FOR SERUM ALCOHOL IS 5 mg/dL FOR MEDICAL PURPOSES ONLY   Salicylate level     Status: None   Collection Time: 11/29/15  7:30 PM  Result Value Ref Range   Salicylate Lvl <4.0 2.8 - 30.0 mg/dL  Acetaminophen level     Status: Abnormal   Collection Time: 11/29/15  7:30 PM  Result Value Ref Range   Acetaminophen (Tylenol), Serum <10 (L) 10 - 30 ug/mL    Comment:        THERAPEUTIC CONCENTRATIONS VARY SIGNIFICANTLY. A RANGE OF 10-30 ug/mL MAY BE AN EFFECTIVE CONCENTRATION FOR MANY PATIENTS. HOWEVER, SOME ARE BEST TREATED AT CONCENTRATIONS OUTSIDE THIS RANGE. ACETAMINOPHEN CONCENTRATIONS >150 ug/mL  AT 4 HOURS AFTER INGESTION AND >50 ug/mL AT 12 HOURS AFTER INGESTION ARE OFTEN ASSOCIATED WITH TOXIC REACTIONS.   cbc     Status: Abnormal   Collection Time: 11/29/15  7:30 PM  Result Value Ref Range   WBC 8.9 3.8 - 10.6 K/uL   RBC 6.03 (H) 4.40 - 5.90 MIL/uL   Hemoglobin 14.5 13.0 - 18.0 g/dL   HCT 44.2 40.0 - 52.0 %   MCV 73.4 (L) 80.0 - 100.0 fL   MCH 24.0 (L) 26.0 - 34.0 pg   MCHC 32.7 32.0 - 36.0 g/dL   RDW 15.4 (H) 11.5 - 14.5 %   Platelets 224 150 - 440 K/uL  Urine Drug Screen, Qualitative     Status: None   Collection Time: 11/29/15  7:30 PM  Result Value Ref Range   Tricyclic, Ur Screen NONE DETECTED NONE DETECTED   Amphetamines, Ur Screen NONE DETECTED NONE DETECTED   MDMA (Ecstasy)Ur Screen NONE DETECTED NONE DETECTED   Cocaine Metabolite,Ur [place] NONE DETECTED NONE DETECTED   Opiate, Ur Screen NONE DETECTED NONE DETECTED   Phencyclidine (PCP) Ur S NONE DETECTED NONE DETECTED   Cannabinoid 50 Ng, Ur [place] NONE DETECTED NONE DETECTED   Barbiturates, Ur Screen NONE DETECTED NONE DETECTED   Benzodiazepine, Ur Scrn NONE DETECTED NONE DETECTED   Methadone Scn, Ur  NONE DETECTED NONE DETECTED    Comment: (NOTE) 606  Tricyclics, urine               Cutoff 1000 ng/mL 200  Amphetamines, urine             Cutoff 1000 ng/mL 300  MDMA (Ecstasy), urine           Cutoff 500 ng/mL 400  Cocaine Metabolite, urine       Cutoff 300 ng/mL 500  Opiate, urine                   Cutoff 300 ng/mL 600  Phencyclidine (PCP), urine      Cutoff 25 ng/mL 700  Cannabinoid, urine              Cutoff 50 ng/mL 800  Barbiturates, urine             Cutoff 200 ng/mL 900  Benzodiazepine, urine           Cutoff 200 ng/mL 1000 Methadone, urine                Cutoff 300 ng/mL 1100 1200 The urine drug screen provides only a preliminary, unconfirmed 1300 analytical test result and should not be used for non-medical 1400 purposes. Clinical consideration and professional judgment should 1500 be applied to any positive drug screen result due to possible 1600 interfering substances. A more specific alternate chemical method 1700 must be used in order to obtain a confirmed analytical result.  1800 Gas chromato graphy / mass spectrometry (GC/MS) is the preferred 1900 confirmatory method.     No current facility-administered medications for this encounter.   Current Outpatient Prescriptions  Medication Sig Dispense Refill  . Canagliflozin-Metformin HCl (INVOKAMET) [phone] MG TABS Take 1 tablet by mouth 2 (two) times daily.    . diphenhydrAMINE (BENADRYL) 25 MG tablet Take 25 mg by mouth every 4 (four) hours as needed.    . divalproex (DEPAKOTE ER) 500 MG 24 hr tablet Take 1,500 mg by mouth at bedtime.    . haloperidol (HALDOL) 5 MG tablet Take 5 mg by mouth at bedtime.    Marland Kitchen  losartan (COZAAR) 50 MG tablet Take 50 mg by mouth daily.    . Multiple Vitamin (THEREMS PO) Take 1 tablet by mouth daily.    . pravastatin (PRAVACHOL) 40 MG tablet Take 40 mg by mouth daily.    . Vitamin D, Ergocalciferol, (DRISDOL) 50000 units CAPS capsule Take 50,000 Units by mouth every 7 (seven) days.       Musculoskeletal: Strength & Muscle Tone: within normal limits Gait & Station: normal Patient leans: N/A  Psychiatric Specialty Exam: Physical Exam  Nursing note and vitals reviewed. Constitutional: He appears well-developed and well-nourished.  HENT:  Head: Normocephalic and atraumatic.  Eyes: Conjunctivae are normal. Pupils are equal, round, and reactive to light.  Neck: Normal range of motion.  Cardiovascular: Normal heart sounds.   Respiratory: Effort normal. No respiratory distress.  GI: Soft.  Musculoskeletal: Normal range of motion.  Neurological: He is alert.  Skin: Skin is warm and dry.  Psychiatric: His behavior is normal. His affect is blunt. His speech is delayed. Cognition and memory are impaired. He expresses impulsivity and inappropriate judgment. He expresses suicidal ideation.    Review of Systems  Constitutional: Negative.   HENT: Negative.   Eyes: Negative.   Respiratory: Negative.   Cardiovascular: Negative.   Gastrointestinal: Negative.   Musculoskeletal: Negative.   Skin: Negative.   Neurological: Negative.   Psychiatric/Behavioral: Positive for suicidal ideas. Negative for depression, hallucinations, memory loss and substance abuse. The patient is not nervous/anxious and does not have insomnia.     Blood pressure 143/80, pulse 93, temperature 98.7 F (37.1 C), temperature source Oral, resp. rate 16, height _0  (1.803 m), weight 105.235 kg (232 lb), SpO2 97 %.Body mass index is 32.37 kg/(m^2).  General Appearance: Well Groomed  Eye Contact:  Minimal  Speech:  Normal Rate  Volume:  Decreased  Mood:  Depressed  Affect:  Appropriate and Full Range  Thought Process:  Goal Directed  Orientation:  Full (Time, Place, and Person)  Thought Content:  Logical  Suicidal Thoughts:  Yes.  without intent/plan  Homicidal Thoughts:  No  Memory:  Immediate;   Good Recent;   Poor Remote;   Poor  Judgement:  Impaired  Insight:  Present  Psychomotor  Activity:  Normal  Concentration:  Concentration: Fair  Recall:  [employer] of Knowledge:  Fair  Language:  Fair  Akathisia:  Negative  Handed:  Right  AIMS (if indicated):     Assets:  Desire for Improvement Financial Resources/Insurance Housing Physical Health Resilience  ADL's:  Intact  Cognition:  WNL  Sleep:        Treatment Plan Summary: Plan 25 year old man with uncertain past psychiatric history although he says he has been diagnosed with schizoaffective disorder. Currently he is saying that he is suicidal but everything else about how he is acting and behaving suggests a strong positive focus on the future. He states that he wants to go to school and get a job and live independently. He mostly seems to be irritated at his current group home and how he feels they are holding him back. He has a past history of having malingered suicidal ideation to try to get a new group home more living facility and he is once again completely focused on that. He has not been behaving in an agitated or threatening manner and has been taking care of himself well in the emergency room. Patient is unlikely to be an acute risk to himself and unlikely to require inpatient hospital  level treatment. Tried to counsel him about appropriate ways to try and work on getting his needs met. No change to medicine. Case reviewed with the ER and TTS. Patient can be discharged at the discretion of the ER and will follow-up with his local mental health provider.  Disposition: Patient does not meet criteria for psychiatric inpatient admission. Supportive therapy provided about ongoing stressors.  Alethia Berthold, MD 11/30/2015 3:53 PM

## 2015-11-30 NOTE — ED Notes (Signed)
ENVIRONMENTAL ASSESSMENT Potentially harmful objects out of patient reach: Yes Personal belongings secured: Yes Patient dressed in hospital provided attire only: Yes Plastic bags out of patient reach: Yes Patient care equipment (cords, cables, call bells, lines, and drains) shortened, removed, or accounted for: Yes Equipment and supplies removed from bottom of stretcher: Yes Potentially toxic materials out of patient reach: Yes Sharps container removed or out of patient reach: Yes  Patient is currently in room sleeping. No signs of acute distress noted. Maintained on 15 minute checks and observation by security camera for safety.
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
## 2015-11-30 NOTE — ED Provider Notes (Signed)
-----------------------------------------   8:37 AM on 11/30/2015 -----------------------------------------   Blood pressure 143/80, pulse 93, temperature 98.7 F (37.1 C), temperature source Oral, resp. rate 16, height 5\' 11"  (1.803 m), weight 232 lb (105.235 kg), SpO2 97 %.  The patient had no acute events since last update.  Calm and cooperative at this time.  Disposition is pending per Psychiatry/Behavioral Medicine team recommendations.     Arnaldo NatalPaul F Dreydon Cardenas, MD 11/30/15 [phone]

## 2015-11-30 NOTE — ED Notes (Signed)
Report was received from Billey Changharles W., RN; Pt. Verbalizes no complaints or distress; verbalizes having S.I.; with a plan to hang himself with a belt; Hi. Continue to monitor with 15 min. Monitoring.
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
## 2016-10-02 ENCOUNTER — Encounter: Payer: Self-pay | Admitting: Emergency Medicine

## 2016-10-02 ENCOUNTER — Emergency Department
Admission: EM | Admit: 2016-10-02 | Discharge: 2016-10-05 | Disposition: A | Discharge status: Home / Self Care | Payer: Medicaid Other | Attending: Student in an Organized Health Care Education/Training Program | Admitting: Student in an Organized Health Care Education/Training Program

## 2016-10-02 DIAGNOSIS — R461 Bizarre personal appearance: Secondary | ICD-10-CM | POA: Diagnosis not present

## 2016-10-02 DIAGNOSIS — Z5181 Encounter for therapeutic drug level monitoring: Secondary | ICD-10-CM | POA: Insufficient documentation

## 2016-10-02 DIAGNOSIS — R462 Strange and inexplicable behavior: Secondary | ICD-10-CM

## 2016-10-02 DIAGNOSIS — E119 Type 2 diabetes mellitus without complications: Secondary | ICD-10-CM | POA: Insufficient documentation

## 2016-10-02 DIAGNOSIS — Z111 Encounter for screening for respiratory tuberculosis: Secondary | ICD-10-CM

## 2016-10-02 DIAGNOSIS — F250 Schizoaffective disorder, bipolar type: Secondary | ICD-10-CM | POA: Diagnosis present

## 2016-10-02 DIAGNOSIS — R4689 Other symptoms and signs involving appearance and behavior: Secondary | ICD-10-CM | POA: Diagnosis present

## 2016-10-02 LAB — URINE DRUG SCREEN, QUALITATIVE (ARMC ONLY)
AMPHETAMINES, UR SCREEN: NOT DETECTED
BARBITURATES, UR SCREEN: NOT DETECTED
BENZODIAZEPINE, UR SCRN: NOT DETECTED
COCAINE METABOLITE, UR ~~LOC~~: NOT DETECTED
Cannabinoid 50 Ng, Ur ~~LOC~~: NOT DETECTED
MDMA (Ecstasy)Ur Screen: NOT DETECTED
METHADONE SCREEN, URINE: NOT DETECTED
Opiate, Ur Screen: NOT DETECTED
Phencyclidine (PCP) Ur S: NOT DETECTED
TRICYCLIC, UR SCREEN: NOT DETECTED

## 2016-10-02 LAB — COMPREHENSIVE METABOLIC PANEL
ALK PHOS: 46 U/L (ref 38–126)
ALT: 33 U/L (ref 17–63)
ANION GAP: 8 (ref 5–15)
AST: 87 U/L — ABNORMAL HIGH (ref 15–41)
Albumin: 4.8 g/dL (ref 3.5–5.0)
BUN: 16 mg/dL (ref 6–20)
CALCIUM: 9.9 mg/dL (ref 8.9–10.3)
CO2: 29 mmol/L (ref 22–32)
CREATININE: 1.17 mg/dL (ref 0.61–1.24)
Chloride: 101 mmol/L (ref 101–111)
Glucose, Bld: 67 mg/dL (ref 65–99)
Potassium: 4.2 mmol/L (ref 3.5–5.1)
SODIUM: 138 mmol/L (ref 135–145)
Total Bilirubin: 0.6 mg/dL (ref 0.3–1.2)
Total Protein: 8.1 g/dL (ref 6.5–8.1)

## 2016-10-02 LAB — URINALYSIS, COMPLETE (UACMP) WITH MICROSCOPIC
Glucose, UA: NEGATIVE mg/dL
Hgb urine dipstick: NEGATIVE
Ketones, ur: 5 mg/dL — AB
Leukocytes, UA: NEGATIVE
NITRITE: NEGATIVE
Protein, ur: NEGATIVE mg/dL
SPECIFIC GRAVITY, URINE: 1.024 (ref 1.005–1.030)
SQUAMOUS EPITHELIAL / LPF: NONE SEEN
pH: 5 (ref 5.0–8.0)

## 2016-10-02 LAB — CBC
HCT: 43.1 % (ref 40.0–52.0)
HEMOGLOBIN: 13.9 g/dL (ref 13.0–18.0)
MCH: 23.6 pg — AB (ref 26.0–34.0)
MCHC: 32.2 g/dL (ref 32.0–36.0)
MCV: 73.5 fL — AB (ref 80.0–100.0)
Platelets: 255 10*3/uL (ref 150–440)
RBC: 5.87 MIL/uL (ref 4.40–5.90)
RDW: 14.4 % (ref 11.5–14.5)
WBC: 10.9 10*3/uL — ABNORMAL HIGH (ref 3.8–10.6)

## 2016-10-02 LAB — ETHANOL: Alcohol, Ethyl (B): <5 mg/dL (ref <5)

## 2016-10-02 MED ORDER — LORAZEPAM 1 MG PO TABS
1.0000 mg | ORAL_TABLET | Freq: Once | ORAL | Status: AC
  Administered 2016-10-02: 1 mg via ORAL
  Filled 2016-10-02: qty 1

## 2016-10-02 MED ORDER — HALOPERIDOL 5 MG PO TABS
5.0000 mg | ORAL_TABLET | Freq: Every day | ORAL | Status: DC
  Administered 2016-10-02 – 2016-10-03 (×2): 5 mg via ORAL
  Filled 2016-10-02 (×2): qty 1

## 2016-10-02 MED ORDER — DIVALPROEX SODIUM ER 500 MG PO TB24
1500.0000 mg | ORAL_TABLET | Freq: Every day | ORAL | Status: DC
  Administered 2016-10-02 – 2016-10-04 (×3): 1500 mg via ORAL
  Filled 2016-10-02 (×3): qty 3

## 2016-10-02 NOTE — ED Triage Notes (Signed)
Pt brought under IVC from [place] [employer] from group home.
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
## 2016-10-02 NOTE — ED Notes (Addendum)
Group Home = [facility] [phone]).

## 2016-10-02 NOTE — ED Notes (Signed)
Patient is asking staff to pray for him.  He is asking for a prayer for "freedom."  He is asking for a prayer to discharge today so he can make it to his job interview in the morning.  He is also asking for a prayer that he receives the correct medication along with a prompt return of his wallet and belongings.

## 2016-10-02 NOTE — ED Notes (Signed)
Patient brought [place] by [place] [employer] after [facility].  Per PD, patient is schizophrenic and has a fixed delusion that another resident stole his wallet.  Patient has been given 30 day eviction letter from Group Home due to his aggressive nature with multiple residents.

## 2016-10-02 NOTE — ED Notes (Signed)
Pt given cup of water 

## 2016-10-02 NOTE — ED Notes (Signed)
Pt is restless.  Constantly asking for drinks, OJ, blankets, fruit, yogurt, candy and gum.  Wants to speak to a pastor so he can discuss the Old Testament.  Pt given a copy of the New testament, but he wants to discuss the Old Testament with the [place].  Pt in and out of bathroom, pacing the room.  Pt is asking for a print out of his picture with all his info ()ss #, pic, address etc) for identification for his job interview in the morning.

## 2016-10-02 NOTE — ED Notes (Signed)
Patient dressed out by security officer [place]ilton [place] and [place] [employer]. In patient belonging bag there is pair of black pants, blue button up shirt a pair of black socks, one pair of black tennis shoes.  Multiple packages of cold medicine and one bottle of cold medication. Patient placed in bed 20 hallway.

## 2016-10-03 DIAGNOSIS — F250 Schizoaffective disorder, bipolar type: Secondary | ICD-10-CM

## 2016-10-03 MED ORDER — LORAZEPAM 2 MG PO TABS
2.0000 mg | ORAL_TABLET | Freq: Once | ORAL | Status: AC
  Administered 2016-10-03: 2 mg via ORAL
  Filled 2016-10-03: qty 1

## 2016-10-03 MED ORDER — HALOPERIDOL 5 MG PO TABS
5.0000 mg | ORAL_TABLET | Freq: Once | ORAL | Status: AC
  Administered 2016-10-03: 5 mg via ORAL
  Filled 2016-10-03: qty 1

## 2016-10-03 NOTE — ED Notes (Signed)
Pt concerned about an interview he had scheduled for today. Writer informed pt that it was unlikely to be discharged on time, to make the interview at 11:00AM. Pt handled information well. Writer allowed pt to call [employer] to make arrangements.

## 2016-10-03 NOTE — Progress Notes (Signed)
Patient had incontinent episode of urine.  Patient questioning discharge by 11am so he can go to his interview at [employer].

## 2016-10-03 NOTE — ED Notes (Signed)
Pt calm and cooperative however continues to voice " I don't want to be here I'm ready to go home". Pt reported he does not know why he is here. RN educated pt about ED psych unit. Pt denies current SI, HI, a/v hallucinations. Pt appears to be religiously preoccupied he asked for phone for a "payer request" he called an unknown number and started to pray repeatedly on phone. Pt stated on phone, " I want God to supernaturally get me out of here" and continued to mumble prayers. Pt was given breakfast and snacks. No aggressive behaviors noted at this time. Will continue to monitor for safety.

## 2016-10-03 NOTE — ED Provider Notes (Signed)
  Physical Exam  BP 130/87 (BP Location: Left Arm)   Pulse 96   Temp 98.1 F (36.7 C) (Oral)   Resp 18   Ht 6' (1.829 m)   Wt 104.3 kg (230 lb)   SpO2 98%   BMI 31.19 kg/m   Physical Exam  ED Course  Procedures  MDM Patient seen by Dr. Toni Amendlapacs. IVC rescinded. He recommend dc back to group home. Stable for dc.       John Copeland, David Hsienta, MD 10/03/16 [phone]

## 2016-10-03 NOTE — Consult Note (Signed)
[place] [facility] Consult   Reason for Consult:  Consult for 26 year old man with a history of schizophrenia or schizoaffective disorder brought to the emergency room last night by law enforcement Referring Physician:  Joni Fears Patient Identification: John Copeland MRN:  [id number] Principal Diagnosis: Schizoaffective disorder [facility]) Diagnosis:   Patient Active Problem List   Diagnosis Date Noted  . Schizoaffective disorder ([place]) [F25.9] 11/30/2015    Total Time spent with patient: 1 hour  Subjective:   John Copeland is a 26 y.o. male patient admitted with "the police brought me here".  HPI:  Patient interviewed. Chart reviewed. This is a 26 year old man with chronic mental illness. Law enforcement brought him here because of his odd behavior. Apparently yesterday afternoon or evening he ran through town to get to the [employer] claiming that he wanted to report one of the other residents at his group home for allegedly stealing his wallet. He was acting bizarre and agitated and police thought that he was unstable so they brought him over here. Patient says that this is in fact true he had gone over to the magistrate wanting to report his wallet being stolen. He says that he thinks that another resident at the group home stole it because they are jealous of him. However, he denies having any thought about hurting the other resident or hurting anyone at all and denies any suicidal thoughts. He says that his mood generally has been okay. Denies problems with sleep or appetite. Denies any hallucinations recently. He says that he has been compliant with his prescribed psychiatric medicine and not using any drugs or alcohol. When asked about the wallet situation he says that he will go back to the group home and look for it and if he is not able to find it he will go through appropriate channels to get new identification. Absolutely denies any intention to hurt anybody. He is  willing to admit that it's possible that the wallet was not stolen and that the person he blames didn't steal it. He hasn't been any particular behavior problems since being here in the emergency room. He says he's been back at this group home for just about 2 weeks now. Claims that he likes it.  Medical history: Past history reportedly of diabetes although not clear that he takes any medicine or gets treated for now and he denies any other medical problems.  Substance abuse history: Patient denies any history of alcohol or drug abuse is nothing in the record about it.  Social history: He is originally from the [place] area but has some relatives in [place] as well. He's been [place] the [place] couple years in group homes.  Past Psychiatric History: Patient has a history of schizoaffective disorder. He denies ever trying to hurt himself or being violent with anyone else. We've seen him before in the emergency room here where he was making threatening statements although on that occasion it turned out that it was being done manipulatively just to get out of his group home. It doesn't sound like we have any evidence that he is ever actually been harmful in the past.  Risk to Self: Suicidal Ideation: No Suicidal Intent: No Is patient at risk for suicide?: No Suicidal Plan?: No Access to Means: No What has been your use of drugs/alcohol within the last 12 months?: None Reported How many times?: 0 Other Self Harm Risks: None Reported Triggers for Past Attempts: None  known Intentional Self Injurious Behavior: None Risk to Others: Homicidal Ideation: No Thoughts of Harm to Others: No Current Homicidal Intent: No Current Homicidal Plan: No Access to Homicidal Means: No Identified Victim: N/A History of harm to others?: No Assessment of Violence: None Noted Violent Behavior Description: None Reported Does patient have access to  weapons?: No Criminal Charges Pending?: No Does patient have a court date: No Prior Inpatient Therapy: Prior Inpatient Therapy: No ([facility] - Pt reports "I don't know") Prior Outpatient Therapy: Prior Outpatient Therapy: No Does patient have an ACCT team?: No Does patient have Intensive In-House Services?  : No Does patient have [facility] services? : No Does patient have P4CC services?: No  Past Medical History:  Past Medical History:  Diagnosis Date  . Depression   . Diabetes mellitus without complication ([place])    No past surgical history on file. Family History: No family history on file. Family Psychiatric  History: Patient says he thinks that his mother had bipolar disorder. Social History:  History  Alcohol Use No     History  Drug Use No    Social History   Social History  . Marital status: Single    Spouse name: N/A  . Number of children: N/A  . Years of education: N/A   Social History Main Topics  . Smoking status: Current Every Day Smoker    Packs/day: 0.50    Types: Cigarettes  . Smokeless tobacco: None  . Alcohol use No  . Drug use: No  . Sexual activity: Not Asked   Other Topics Concern  . None   Social History Narrative  . None   Additional Social History:    Allergies:  No Known Allergies  Labs:  Results for orders placed or performed during the hospital encounter of 10/02/16 (from the past 48 hour(s))  CBC     Status: Abnormal   Collection Time: 10/02/16  8:37 PM  Result Value Ref Range   WBC 10.9 (H) 3.8 - 10.6 K/uL   RBC 5.87 4.40 - 5.90 MIL/uL   Hemoglobin 13.9 13.0 - 18.0 g/dL   HCT 43.1 40.0 - 52.0 %   MCV 73.5 (L) 80.0 - 100.0 fL   MCH 23.6 (L) 26.0 - 34.0 pg   MCHC 32.2 32.0 - 36.0 g/dL   RDW 14.4 11.5 - 14.5 %   Platelets 255 150 - 440 K/uL  Comprehensive metabolic panel     Status: Abnormal   Collection Time: 10/02/16  8:37 PM  Result Value Ref Range   Sodium 138 135 - 145 mmol/L   Potassium 4.2 3.5 - 5.1 mmol/L   Chloride 101  101 - 111 mmol/L   CO2 29 22 - 32 mmol/L   Glucose, Bld 67 65 - 99 mg/dL   BUN 16 6 - 20 mg/dL   Creatinine, Ser 1.17 0.61 - 1.24 mg/dL   Calcium 9.9 8.9 - 10.3 mg/dL   Total Protein 8.1 6.5 - 8.1 g/dL   Albumin 4.8 3.5 - 5.0 g/dL   AST 87 (H) 15 - 41 U/L   ALT 33 17 - 63 U/L   Alkaline Phosphatase 46 38 - 126 U/L   Total Bilirubin 0.6 0.3 - 1.2 mg/dL   GFR calc non Af Amer >60 >60 mL/min   GFR calc Af Amer >60 >60 mL/min    Comment: (NOTE) The eGFR has been calculated using the CKD EPI equation. This calculation has not been validated in all clinical situations. eGFR's persistently <60 mL/min  signify possible Chronic Kidney Disease.    Anion gap 8 5 - 15  Ethanol     Status: None   Collection Time: 10/02/16  8:37 PM  Result Value Ref Range   Alcohol, Ethyl (B) <5 <5 mg/dL    Comment:        LOWEST DETECTABLE LIMIT FOR SERUM ALCOHOL IS 5 mg/dL FOR MEDICAL PURPOSES ONLY   Urinalysis, Complete w Microscopic     Status: Abnormal   Collection Time: 10/02/16  8:37 PM  Result Value Ref Range   Color, Urine YELLOW (A) YELLOW   APPearance CLEAR (A) CLEAR   Specific Gravity, Urine 1.024 1.005 - 1.030   pH 5.0 5.0 - 8.0   Glucose, UA NEGATIVE NEGATIVE mg/dL   Hgb urine dipstick NEGATIVE NEGATIVE   Bilirubin Urine SMALL (A) NEGATIVE   Ketones, ur 5 (A) NEGATIVE mg/dL   Protein, ur NEGATIVE NEGATIVE mg/dL   Nitrite NEGATIVE NEGATIVE   Leukocytes, UA NEGATIVE NEGATIVE   RBC / HPF 0-5 0 - 5 RBC/hpf   WBC, UA 0-5 0 - 5 WBC/hpf   Bacteria, UA RARE (A) NONE SEEN   Squamous Epithelial / LPF NONE SEEN NONE SEEN  Urine Drug Screen, Qualitative ([facility] only)     Status: None   Collection Time: 10/02/16  8:37 PM  Result Value Ref Range   Tricyclic, Ur Screen NONE DETECTED NONE DETECTED   Amphetamines, Ur Screen NONE DETECTED NONE DETECTED   MDMA (Ecstasy)Ur Screen NONE DETECTED NONE DETECTED   Cocaine Metabolite,Ur [place] NONE DETECTED NONE DETECTED   Opiate, Ur Screen NONE DETECTED NONE  DETECTED   Phencyclidine (PCP) Ur S NONE DETECTED NONE DETECTED   Cannabinoid 50 Ng, Ur [place] NONE DETECTED NONE DETECTED   Barbiturates, Ur Screen NONE DETECTED NONE DETECTED   Benzodiazepine, Ur Scrn NONE DETECTED NONE DETECTED   Methadone Scn, Ur NONE DETECTED NONE DETECTED    Comment: (NOTE) 270  Tricyclics, urine               Cutoff 1000 ng/mL 200  Amphetamines, urine             Cutoff 1000 ng/mL 300  MDMA (Ecstasy), urine           Cutoff 500 ng/mL 400  Cocaine Metabolite, urine       Cutoff 300 ng/mL 500  Opiate, urine                   Cutoff 300 ng/mL 600  Phencyclidine (PCP), urine      Cutoff 25 ng/mL 700  Cannabinoid, urine              Cutoff 50 ng/mL 800  Barbiturates, urine             Cutoff 200 ng/mL 900  Benzodiazepine, urine           Cutoff 200 ng/mL 1000 Methadone, urine                Cutoff 300 ng/mL 1100 1200 The urine drug screen provides only a preliminary, unconfirmed 1300 analytical test result and should not be used for non-medical 1400 purposes. Clinical consideration and professional judgment should 1500 be applied to any positive drug screen result due to possible 1600 interfering substances. A more specific alternate chemical method 1700 must be used in order to obtain a confirmed analytical result.  1800 Gas chromato graphy / mass spectrometry (GC/MS) is the preferred 1900 confirmatory method.     Current  Facility-Administered Medications  Medication Dose Route Frequency Provider Last Rate Last Dose  . divalproex (DEPAKOTE ER) 24 hr tablet 1,500 mg  1,500 mg Oral QHS Merlyn Lot, MD   1,500 mg at 10/02/16 2313  . haloperidol (HALDOL) tablet 5 mg  5 mg Oral QHS Merlyn Lot, MD   5 mg at 10/02/16 2313   Current Outpatient Prescriptions  Medication Sig Dispense Refill  . atorvastatin (LIPITOR) 10 MG tablet Take 10 mg by mouth at bedtime.    . divalproex (DEPAKOTE ER) 500 MG 24 hr tablet Take 500 mg by mouth at bedtime.     . haloperidol  (HALDOL) 10 MG tablet Take 10 mg by mouth daily.     Marland Kitchen lisinopril (PRINIVIL,ZESTRIL) 10 MG tablet Take 10 mg by mouth daily.    . metFORMIN (GLUCOPHAGE) 500 MG tablet Take 1,000 mg by mouth 2 (two) times daily with a meal.    . Multiple Vitamin (THEREMS PO) Take 1 tablet by mouth daily.    . Vitamin D, Ergocalciferol, (DRISDOL) 50000 units CAPS capsule Take 50,000 Units by mouth every 7 (seven) days. Sunday      Musculoskeletal: Strength & Muscle Tone: within normal limits Gait & Station: normal Patient leans: N/A  Psychiatric Specialty Exam: Physical Exam  Nursing note and vitals reviewed. Constitutional: He appears well-developed and well-nourished.  HENT:  Head: Normocephalic and atraumatic.  Eyes: Conjunctivae are normal. Pupils are equal, round, and reactive to light.  Neck: Normal range of motion.  Cardiovascular: Regular rhythm and normal heart sounds.   Respiratory: Effort normal and breath sounds normal. No respiratory distress.  GI: Soft.  Musculoskeletal: Normal range of motion.  Neurological: He is alert.  Skin: Skin is warm and dry.  Psychiatric: His speech is normal. His affect is blunt. He is withdrawn. Thought content is paranoid. Cognition and memory are normal. He expresses impulsivity. He expresses no homicidal and no suicidal ideation.    Review of Systems  Constitutional: Negative.   HENT: Negative.   Eyes: Negative.   Respiratory: Negative.   Cardiovascular: Negative.   Gastrointestinal: Negative.   Musculoskeletal: Negative.   Skin: Negative.   Neurological: Negative.   Psychiatric/Behavioral: Negative.     Blood pressure (!) 141/80, pulse (!) 103, temperature 97.8 F (36.6 C), temperature source Oral, resp. rate 18, height 6' (1.829 m), weight 104.3 kg (230 lb), SpO2 100 %.Body mass index is 31.19 kg/m.  General Appearance: Casual  Eye Contact:  Fair  Speech:  Clear and Coherent  Volume:  Decreased  Mood:  Euthymic  Affect:  Constricted   Thought Process:  Goal Directed  Orientation:  Full (Time, Place, and Person)  Thought Content:  Logical  Suicidal Thoughts:  No  Homicidal Thoughts:  No  Memory:  Immediate;   Good Recent;   Fair Remote;   Fair  Judgement:  Fair  Insight:  Fair  Psychomotor Activity:  Normal  Concentration:  Concentration: Fair  Recall:  [employer] of Knowledge:  Fair  Language:  Fair  Akathisia:  No  Handed:  Right  AIMS (if indicated):     Assets:  Desire for Improvement Financial Resources/Insurance Housing Physical Health Resilience Social Support  ADL's:  Intact  Cognition:  Impaired,  Mild  Sleep:        Treatment Plan Summary: Daily contact with patient to assess and evaluate symptoms and progress in treatment, Medication management and Plan 26 year old man with chronic mental illness. He did have some agitated behavior yesterday but  there is no evidence that he was threatening nor that he did anything violent. Patient is absolutely denying any suicidal or homicidal thoughts today. He slightly paranoid but he is willing to entertain the possibility that he is mistaken and not making any threats about it. I suspect he is pretty much at his baseline. No indication for inpatient treatment. Case reviewed with TTS and emergency room physician. Discontinue IVC. He can be discharged from the emergency room and will continue with his usual outpatient treatment in the community.  Disposition: Patient does not meet criteria for psychiatric inpatient admission. Supportive therapy provided about ongoing stressors.  John Berthold, MD 10/03/2016 2:31 PM

## 2016-10-03 NOTE — Discharge Instructions (Signed)
Take your meds as prescribed.   See your doctor  Return to ER if you have thoughts of harming yourself or others, worse hallucinations.

## 2016-10-03 NOTE — BH Assessment (Signed)
Assessment Note  John Copeland is an 26 y.o. male who presents to ED under IVC. Pt was transported to the ED after being picked up by the police for manic behaviors and disorganized thoughts. Pt denies SI/HI/Hallucinations. Pt is very hyper-religious as he repeatedly asked ask ED staff to see a pastor, the chaplain, and the old testimony. Pt reports he needs to leave the hospital because he has a job interview with [employer] tomorrow at 11am. Pt reports he currently lives at a local group home, where he has lived the last week and a half. He reports compliance with is current medications as they are administered by group home staff. When asked about any altercations or issues at current residence pt states "I believe in turning the other cheek, I live by the Bible". Pt alert and oriented x4 and pleasant in demeanor.  Diagnosis: Schizophrenia, by history  Past Medical History:  Past Medical History:  Diagnosis Date  . Depression   . Diabetes mellitus without complication ([facility])     No past surgical history on file.  Family History: No family history on file.  Social History:  reports that he has been smoking Cigarettes.  He has been smoking about 0.50 packs per day. He does not have any smokeless tobacco history on file. He reports that he does not drink alcohol or use drugs.  Additional Social History:  Alcohol / Drug Use Pain Medications: None Reported Prescriptions: None Reported Over the Counter: None Reported History of alcohol / drug use?: No history of alcohol / drug abuse  CIWA: CIWA-Ar BP: (!) 149/109 Pulse Rate: (!) 106 COWS:    Allergies: No Known Allergies  Home Medications:  (Not in a hospital admission)  OB/GYN Status:  No LMP for male patient.  General Assessment Data Location of Assessment: [facility] ED TTS Assessment: In system Is this a Tele or Face-to-Face Assessment?: Face-to-Face Is this an Initial Assessment or a Re-assessment for this encounter?: Initial  Assessment Marital status: Single Maiden name: N/A Is patient pregnant?: No Pregnancy Status: No Living Arrangements: Group Home Can pt return to current living arrangement?: Yes Admission Status: Involuntary Is patient capable of signing voluntary admission?: No Referral Source: Self/Family/Friend Insurance type: Medicaid  Medical Screening Exam [facility] Walk-in ONLY) Medical Exam completed: Yes  Crisis Care Plan Living Arrangements: Group Home Legal Guardian: Other: (Self) Name of Psychiatrist: Pearletha Alfred Name of Therapist: Pearletha Alfred  Education Status Is patient currently in school?: No Current Grade: N/A Highest grade of school patient has completed: 8th Grade Name of school: N/A Contact person: N/A  Risk to self with the past 6 months Suicidal Ideation: No Has patient been a risk to self within the past 6 months prior to admission? : No Suicidal Intent: No Has patient had any suicidal intent within the past 6 months prior to admission? : No Is patient at risk for suicide?: No Suicidal Plan?: No Has patient had any suicidal plan within the past 6 months prior to admission? : No Access to Means: No What has been your use of drugs/alcohol within the last 12 months?: None Reported Previous Attempts/Gestures: No How many times?: 0 Other Self Harm Risks: None Reported Triggers for Past Attempts: None known Intentional Self Injurious Behavior: None Family Suicide History: Unknown Recent stressful life event(s):  (None ) Persecutory voices/beliefs?: No Depression: No (Denied depressive symptoms) Depression Symptoms:  (Denied depressive symptoms) Substance abuse history and/or treatment for substance abuse?: Yes Suicide prevention information given to non-admitted patients:  Not applicable  Risk to Others within the past 6 months Homicidal Ideation: No Does patient have any lifetime risk of violence toward others beyond the six months prior to admission? : No Thoughts of  Harm to Others: No Current Homicidal Intent: No Current Homicidal Plan: No Access to Homicidal Means: No Identified Victim: N/A History of harm to others?: No Assessment of Violence: None Noted Violent Behavior Description: None Reported Does patient have access to weapons?: No Criminal Charges Pending?: No Does patient have a court date: No Is patient on probation?: No  Psychosis Hallucinations: None noted (Pt denied hallucinations) Delusions:  (Hyper-Religious)  Mental Status Report Appearance/Hygiene: In scrubs Eye Contact: Good Motor Activity: Freedom of movement Speech: Other (Comment) (Pt making religious references) Level of Consciousness: Alert Mood: Pleasant Affect: Preoccupied Anxiety Level: Minimal Thought Processes: Flight of Ideas Judgement: Partial Orientation: Person, Place, Time, Situation, Appropriate for developmental age Obsessive Compulsive Thoughts/Behaviors: Moderate (Religious references)  Cognitive Functioning Concentration: Fair Memory: Recent Intact, Remote Intact IQ: Average Insight: see judgement above Impulse Control: Unable to Assess Appetite: Good Weight Loss: 0 Weight Gain: 0 Sleep: No Change Total Hours of Sleep: 12 Vegetative Symptoms: None  ADLScreening [facility] Assessment Services) Patient's cognitive ability adequate to safely complete daily activities?: Yes Patient able to express need for assistance with ADLs?: Yes Independently performs ADLs?: Yes (appropriate for developmental age)  Prior Inpatient Therapy Prior Inpatient Therapy: No ([facility] - Pt reports "I don't know")  Prior Outpatient Therapy Prior Outpatient Therapy: No Does patient have an ACCT team?: No Does patient have Intensive In-House Services?  : No Does patient have [facility] services? : No Does patient have P4CC services?: No  ADL Screening (condition at time of admission) Patient's cognitive ability adequate to safely complete daily activities?: Yes Patient  able to express need for assistance with ADLs?: Yes Independently performs ADLs?: Yes (appropriate for developmental age)       Abuse/Neglect Assessment (Assessment to be complete while patient is alone) Physical Abuse: Denies Verbal Abuse: Denies Sexual Abuse: Denies Exploitation of patient/patient's resources: Denies Self-Neglect: Denies Values / Beliefs Cultural Requests During Hospitalization: None Spiritual Requests During Hospitalization: None Consults Spiritual Care Consult Needed: No Social Work Consult Needed: No      Additional Information 1:1 In Past 12 Months?: No CIRT Risk: No Elopement Risk: No Does patient have medical clearance?: Yes  Child/Adolescent Assessment Running Away Risk:  (Patient is an adult)  Disposition:  Disposition Initial Assessment Completed for this Encounter: Yes Disposition of Patient: Referred to (ER ED Ordered Psych Consult) Patient referred to: Other (Comment) (ER ED Ordered Psych Consult)  On Site Evaluation by:   Reviewed with Physician:    Wilmon ArmsSTEVENSON, Lavonya Hoerner 10/03/2016 12:00 AM

## 2016-10-03 NOTE — ED Notes (Signed)
Pt will be staying for an additional night due to transportation issues. Will re-eval transport in AM

## 2016-10-03 NOTE — ED Notes (Signed)
Patient laughing loudly at TV. Continues to try to leave hallway, but can be redirected to room with ease. Patient continues with manic behavior. MD aware. Medicated as ordered.

## 2016-10-04 MED ORDER — LISINOPRIL 5 MG PO TABS
10.0000 mg | ORAL_TABLET | Freq: Every day | ORAL | Status: DC
  Administered 2016-10-04 – 2016-10-05 (×2): 10 mg via ORAL
  Filled 2016-10-04 (×2): qty 2

## 2016-10-04 MED ORDER — ATORVASTATIN CALCIUM 10 MG PO TABS
10.0000 mg | ORAL_TABLET | Freq: Every day | ORAL | Status: DC
  Administered 2016-10-04 – 2016-10-05 (×2): 10 mg via ORAL
  Filled 2016-10-04 (×2): qty 1

## 2016-10-04 MED ORDER — HALOPERIDOL 5 MG PO TABS
5.0000 mg | ORAL_TABLET | Freq: Two times a day (BID) | ORAL | Status: DC
  Administered 2016-10-04 – 2016-10-05 (×2): 5 mg via ORAL
  Filled 2016-10-04 (×2): qty 1

## 2016-10-04 MED ORDER — METFORMIN HCL 500 MG PO TABS
1000.0000 mg | ORAL_TABLET | Freq: Two times a day (BID) | ORAL | Status: DC
  Administered 2016-10-04 – 2016-10-05 (×3): 1000 mg via ORAL
  Filled 2016-10-04 (×3): qty 2

## 2016-10-04 NOTE — ED Notes (Signed)
Pt watching TV in his room. No needs or concerns at this time. Maintained on 15 minute checks and observation by security camera for safety.

## 2016-10-04 NOTE — ED Notes (Signed)
Report to include Situation, Background, Assessment, and Recommendations received from Amy B. RN. Patient alert and oriented, warm and dry, in no acute distress. Patient denies SI, HI, AVH and pain. Patient made aware of Q15 minute rounds and security cameras for their safety. Patient instructed to come to me with needs or concerns. 

## 2016-10-04 NOTE — ED Notes (Signed)
Pt continually coming to nurses station door to ask about his transportation. RN explained to patient social work is working with group home to arrange transportation. Pt assured we are doing all we can to get him home as soon as possible. Pt accepting. Maintained on 15 minute checks and observation by security camera for safety.

## 2016-10-04 NOTE — ED Notes (Signed)
Patient at nurse's station questioning discharge.  Writer has repeatedly expressed to patient that he would discharge today.  Patient cannot seem to process the information given.

## 2016-10-04 NOTE — ED Notes (Signed)
Hourly rounding reveals patient sleeping in room. No complaints, stable, in no acute distress. Q15 minute rounds and monitoring via Security Cameras to continue. 

## 2016-10-04 NOTE — ED Notes (Addendum)
Patient resting quietly in room. No noted distress or abnormal behaviors noted. Will continue 15 minute checks and observation by security camera for safety. 

## 2016-10-04 NOTE — ED Provider Notes (Signed)
-----------------------------------------   7:57 AM on 10/04/2016 -----------------------------------------   Blood pressure 130/87, pulse 96, temperature 98.1 F (36.7 C), temperature source Oral, resp. rate 18, height 6' (1.829 m), weight 104.3 kg (230 lb), SpO2 98 %.  The patient had no acute events since last update.  Calm and cooperative at this time.  Disposition is pending Psychiatry/Behavioral Medicine team recommendations.     Arnaldo NatalMalinda, Jakyah Bradby F, MD 10/04/16 [phone]

## 2016-10-04 NOTE — ED Notes (Signed)
Pt compliant with VS. Pt stating he wants to get out of here. Pt offered emotional support. Pt laying in bed under covers. Maintained on 15 minute checks and observation by security camera for safety.

## 2016-10-04 NOTE — ED Notes (Signed)
Pt taking a shower 

## 2016-10-04 NOTE — ED Notes (Signed)
RN called [facility] to arrange transportation. RN was told the patient has been threatening other residents and is not allowed back to the group home. RN will notify ER Child psychotherapistsocial worker.

## 2016-10-04 NOTE — ED Notes (Signed)
Pt informed the hospital social worker has been in touch with his group home and transportation will be arranged. Pt accepting. Maintained on 15 minute checks and observation by security camera for safety.

## 2016-10-04 NOTE — ED Notes (Signed)
Pt informed by security and RN he was not returning to current group home. Pt also made aware he has a guardian who is currently searching for a new group home. Pt irritable. Maintained on 15 minute checks and observation by security camera for safety.

## 2016-10-04 NOTE — ED Notes (Signed)
Pt continues to come to nurses station asking about his discharge. RN again  assured pt the staff is working to get him out of the hospital. Pt is irritable, but returned to his room. Maintained on 15 minute checks and observation by security camera for safety.

## 2016-10-04 NOTE — ED Notes (Signed)
Pt asking for gloves and cleaning supplies to clean the shower. "I'm sorry. I had an accident."  RN explained to patient environmental services would be called to clean the shower. Pt did pick up / throw out all of his supplies. Maintained on 15 minute checks and observation by security camera for safety.

## 2016-10-04 NOTE — ED Notes (Signed)
Pt's guardian Sharl Ma(Marty) called to check disposition of patient. Guardian given [facility] social worker's phone number to inquire about an FL2, needed to find patient new housing placement.

## 2016-10-04 NOTE — [place] FL2 (Signed)
  [place] MEDICAID FL2 LEVEL OF CARE SCREENING TOOL     IDENTIFICATION  Patient Name: John SellsJoshua Copeland Birthdate: [birth year] Sex: male Admission Date (Current Location): 10/02/2016  [place]   Facility and Address:  [facility], [address], [place], [place] [address]      Provider Number: [id number]  Attending Physician Name and Address:  No att. providers found  Relative Name and Phone Number:       Current Level of Care:  (family care home) Recommended Level of Care: Family Care Home Prior Approval Number:    Date Approved/Denied:   PASRR Number:    Discharge Plan:  (family care/group home)    Current Diagnoses: Patient Active Problem List   Diagnosis Date Noted  . Schizoaffective disorder (HCC) 11/30/2015  Diabetes Depression  Orientation RESPIRATION BLADDER Height & Weight     Self, Time, Situation, Place  Normal Continent Weight: 230 lb (104.3 kg) Height:  6' (182.9 cm)  BEHAVIORAL SYMPTOMS/MOOD NEUROLOGICAL BOWEL NUTRITION STATUS   (no behaviors [facility] ED; reportedly by group home, will at times get frustrated with other residents)  (none) Continent Diet (regular)  AMBULATORY STATUS COMMUNICATION OF NEEDS Skin   Independent Verbally Normal                       Personal Care Assistance Level of Assistance              Functional Limitations Info             SPECIAL CARE FACTORS FREQUENCY                       Contractures Contractures Info: Not present    Additional Factors Info  Code Status, Allergies Code Status Info: full Allergies Info: nka           Current Medications (10/04/2016):  This is the current hospital active medication list Current Facility-Administered Medications  Medication Dose Route Frequency Provider Last Rate Last Dose  . divalproex (DEPAKOTE ER) 24 hr tablet 1,500 mg  1,500 mg Oral QHS Willy Eddyobinson, Patrick, MD   1,500 mg at 10/03/16 2223   . haloperidol (HALDOL) tablet 5 mg  5 mg Oral QHS Willy Eddyobinson, Patrick, MD   5 mg at 10/03/16 2223   Current Outpatient Prescriptions  Medication Sig Dispense Refill  . atorvastatin (LIPITOR) 10 MG tablet Take 10 mg by mouth at bedtime.    . divalproex (DEPAKOTE ER) 500 MG 24 hr tablet Take 500 mg by mouth at bedtime.     . haloperidol (HALDOL) 10 MG tablet Take 10 mg by mouth daily.     Marland Kitchen. lisinopril (PRINIVIL,ZESTRIL) 10 MG tablet Take 10 mg by mouth daily.    . metFORMIN (GLUCOPHAGE) 500 MG tablet Take 1,000 mg by mouth 2 (two) times daily with a meal.    . Multiple Vitamin (THEREMS PO) Take 1 tablet by mouth daily.    . Vitamin D, Ergocalciferol, (DRISDOL) 50000 units CAPS capsule Take 50,000 Units by mouth every 7 (seven) days. Sunday       Discharge Medications: Please see discharge summary for a list of discharge medications.  Relevant Imaging Results:  Relevant Lab Results:   Additional Information    York SpanielMonica Lacresha Fusilier, LCSW

## 2016-10-04 NOTE — Clinical Social Work Note (Signed)
Glee ArvinLatoya [phone]) from [facility] group [facility] called CSW this afternoon to say that she would not take patient back to her group home because of his aggressiveness towards other residents. Glee ArvinLatoya is aware that this CSW will contact the the [place] [employer] Complaint [employer] and file a formal report with concern for "dumping." [name] [phone]), patient's guardian with DSS [place] stated that he is working on 3 group homes that are considering patient: [facility], [facility], and [facility]. DSS Guardian as well as [facility] (Joann: fax: [phone]) )are working toward placing patient in a new group home. Information sent to both [facility] and [facility].   CSW contacted the facility complaint line and spoke with Vee to make the report.  York SpanielMonica Rashawn Rolon MSW,LCSW [phone]

## 2016-10-04 NOTE — Consult Note (Signed)
[facility] Consult   Reason for Consult:  Consult for 26 year old man with a history of schizophrenia or schizoaffective disorder brought to the emergency room last night by law enforcement Referring Physician:  Joni Fears Patient Identification: Eydan Chianese MRN:  [id number] Principal Diagnosis: Schizoaffective disorder [facility]) Diagnosis:   Patient Active Problem List   Diagnosis Date Noted  . Schizoaffective disorder ([place]) [F25.9] 11/30/2015    Total Time spent with patient: 20 minutes  Subjective:   Doran Nestle is a 26 y.o. male patient admitted with "the police brought me here".  This is a follow-up for Thursday the 24th for this 26 year old man with schizoaffective disorder. We had intended to discharge him from the emergency room yesterday. His group home is refusing at this point to come and pick him up claiming that he is threatening to other residents. Patient has been told the news and is angry and irritable but has not acted out violently or made any threats. He has been compliant with medication. Taking care of basic hygiene. Does not show any actively dangerous behavior here in the emergency room.  HPI:  Patient interviewed. Chart reviewed. This is a 26 year old man with chronic mental illness. Law enforcement brought him here because of his odd behavior. Apparently yesterday afternoon or evening he ran through town to get to the [employer] Department claiming that he wanted to report one of the other residents at his group home for allegedly stealing his wallet. He was acting bizarre and agitated and police thought that he was unstable so they brought him over here. Patient says that this is in fact true he had gone over to the magistrate wanting to report his wallet being stolen. He says that he thinks that another resident at the group home stole it because they are jealous of him. However, he denies having any thought about hurting the other resident or hurting  anyone at all and denies any suicidal thoughts. He says that his mood generally has been okay. Denies problems with sleep or appetite. Denies any hallucinations recently. He says that he has been compliant with his prescribed psychiatric medicine and not using any drugs or alcohol. When asked about the wallet situation he says that he will go back to the group home and look for it and if he is not able to find it he will go through appropriate channels to get new identification. Absolutely denies any intention to hurt anybody. He is willing to admit that it's possible that the wallet was not stolen and that the person he blames didn't steal it. He hasn't been any particular behavior problems since being here in the emergency room. He says he's been back at this group home for just about 2 weeks now. Claims that he likes it.  Medical history: Past history reportedly of diabetes although not clear that he takes any medicine or gets treated for now and he denies any other medical problems.  Substance abuse history: Patient denies any history of alcohol or drug abuse is nothing in the record about it.  Social history: He is originally from the [place] area but has some relatives in [place] as well. [place] couple years in group homes.  Past Psychiatric History: Patient has a history of schizoaffective disorder. He denies ever trying to hurt himself or being violent with anyone else. We've seen him before in the emergency room here where he was making threatening  statements although on that occasion it turned out that it was being done manipulatively just to get out of his group home. It doesn't sound like we have any evidence that he is ever actually been harmful in the past.  Risk to Self: Suicidal Ideation: No Suicidal Intent: No Is patient at risk for suicide?: No Suicidal Plan?: No Access to Means: No What has been your  use of drugs/alcohol within the last 12 months?: None Reported How many times?: 0 Other Self Harm Risks: None Reported Triggers for Past Attempts: None known Intentional Self Injurious Behavior: None Risk to Others: Homicidal Ideation: No Thoughts of Harm to Others: No Current Homicidal Intent: No Current Homicidal Plan: No Access to Homicidal Means: No Identified Victim: N/A History of harm to others?: No Assessment of Violence: None Noted Violent Behavior Description: None Reported Does patient have access to weapons?: No Criminal Charges Pending?: No Does patient have a court date: No Prior Inpatient Therapy: Prior Inpatient Therapy: No ([facility] - Pt reports "I don't know") Prior Outpatient Therapy: Prior Outpatient Therapy: No Does patient have an ACCT team?: No Does patient have Intensive In-House Services?  : No Does patient have [facility] services? : No Does patient have P4CC services?: No  Past Medical History:  Past Medical History:  Diagnosis Date  . Depression   . Diabetes mellitus without complication ([place])    No past surgical history on file. Family History: No family history on file. Family Psychiatric  History: Patient says he thinks that his mother had bipolar disorder. Social History:  History  Alcohol Use No     History  Drug Use No    Social History   Social History  . Marital status: Single    Spouse name: N/A  . Number of children: N/A  . Years of education: N/A   Social History Main Topics  . Smoking status: Current Every Day Smoker    Packs/day: 0.50    Types: Cigarettes  . Smokeless tobacco: None  . Alcohol use No  . Drug use: No  . Sexual activity: Not Asked   Other Topics Concern  . None   Social History Narrative  . None   Additional Social History:    Allergies:  No Known Allergies  Labs:  Results for orders placed or performed during the hospital encounter of 10/02/16 (from the past 48 hour(s))  CBC     Status: Abnormal    Collection Time: 10/02/16  8:37 PM  Result Value Ref Range   WBC 10.9 (H) 3.8 - 10.6 K/uL   RBC 5.87 4.40 - 5.90 MIL/uL   Hemoglobin 13.9 13.0 - 18.0 g/dL   HCT 43.1 40.0 - 52.0 %   MCV 73.5 (L) 80.0 - 100.0 fL   MCH 23.6 (L) 26.0 - 34.0 pg   MCHC 32.2 32.0 - 36.0 g/dL   RDW 14.4 11.5 - 14.5 %   Platelets 255 150 - 440 K/uL  Comprehensive metabolic panel     Status: Abnormal   Collection Time: 10/02/16  8:37 PM  Result Value Ref Range   Sodium 138 135 - 145 mmol/L   Potassium 4.2 3.5 - 5.1 mmol/L   Chloride 101 101 - 111 mmol/L   CO2 29 22 - 32 mmol/L   Glucose, Bld 67 65 - 99 mg/dL   BUN 16 6 - 20 mg/dL   Creatinine, Ser 1.17 0.61 - 1.24 mg/dL   Calcium 9.9 8.9 - 10.3 mg/dL   Total Protein 8.1 6.5 -  8.1 g/dL   Albumin 4.8 3.5 - 5.0 g/dL   AST 87 (H) 15 - 41 U/L   ALT 33 17 - 63 U/L   Alkaline Phosphatase 46 38 - 126 U/L   Total Bilirubin 0.6 0.3 - 1.2 mg/dL   GFR calc non Af Amer >60 >60 mL/min   GFR calc Af Amer >60 >60 mL/min    Comment: (NOTE) The eGFR has been calculated using the CKD EPI equation. This calculation has not been validated in all clinical situations. eGFR's persistently <60 mL/min signify possible Chronic Kidney Disease.    Anion gap 8 5 - 15  Ethanol     Status: None   Collection Time: 10/02/16  8:37 PM  Result Value Ref Range   Alcohol, Ethyl (B) <5 <5 mg/dL    Comment:        LOWEST DETECTABLE LIMIT FOR SERUM ALCOHOL IS 5 mg/dL FOR MEDICAL PURPOSES ONLY   Urinalysis, Complete w Microscopic     Status: Abnormal   Collection Time: 10/02/16  8:37 PM  Result Value Ref Range   Color, Urine YELLOW (A) YELLOW   APPearance CLEAR (A) CLEAR   Specific Gravity, Urine 1.024 1.005 - 1.030   pH 5.0 5.0 - 8.0   Glucose, UA NEGATIVE NEGATIVE mg/dL   Hgb urine dipstick NEGATIVE NEGATIVE   Bilirubin Urine SMALL (A) NEGATIVE   Ketones, ur 5 (A) NEGATIVE mg/dL   Protein, ur NEGATIVE NEGATIVE mg/dL   Nitrite NEGATIVE NEGATIVE   Leukocytes, UA NEGATIVE  NEGATIVE   RBC / HPF 0-5 0 - 5 RBC/hpf   WBC, UA 0-5 0 - 5 WBC/hpf   Bacteria, UA RARE (A) NONE SEEN   Squamous Epithelial / LPF NONE SEEN NONE SEEN  Urine Drug Screen, Qualitative ([facility] only)     Status: None   Collection Time: 10/02/16  8:37 PM  Result Value Ref Range   Tricyclic, Ur Screen NONE DETECTED NONE DETECTED   Amphetamines, Ur Screen NONE DETECTED NONE DETECTED   MDMA (Ecstasy)Ur Screen NONE DETECTED NONE DETECTED   Cocaine Metabolite,Ur [place] NONE DETECTED NONE DETECTED   Opiate, Ur Screen NONE DETECTED NONE DETECTED   Phencyclidine (PCP) Ur S NONE DETECTED NONE DETECTED   Cannabinoid 50 Ng, Ur [place] NONE DETECTED NONE DETECTED   Barbiturates, Ur Screen NONE DETECTED NONE DETECTED   Benzodiazepine, Ur Scrn NONE DETECTED NONE DETECTED   Methadone Scn, Ur NONE DETECTED NONE DETECTED    Comment: (NOTE) 891  Tricyclics, urine               Cutoff 1000 ng/mL 200  Amphetamines, urine             Cutoff 1000 ng/mL 300  MDMA (Ecstasy), urine           Cutoff 500 ng/mL 400  Cocaine Metabolite, urine       Cutoff 300 ng/mL 500  Opiate, urine                   Cutoff 300 ng/mL 600  Phencyclidine (PCP), urine      Cutoff 25 ng/mL 700  Cannabinoid, urine              Cutoff 50 ng/mL 800  Barbiturates, urine             Cutoff 200 ng/mL 900  Benzodiazepine, urine           Cutoff 200 ng/mL 1000 Methadone, urine  Cutoff 300 ng/mL 1100 1200 The urine drug screen provides only a preliminary, unconfirmed 1300 analytical test result and should not be used for non-medical 1400 purposes. Clinical consideration and professional judgment should 1500 be applied to any positive drug screen result due to possible 1600 interfering substances. A more specific alternate chemical method 1700 must be used in order to obtain a confirmed analytical result.  1800 Gas chromato graphy / mass spectrometry (GC/MS) is the preferred 1900 confirmatory method.     Current Facility-Administered  Medications  Medication Dose Route Frequency Provider Last Rate Last Dose  . atorvastatin (LIPITOR) tablet 10 mg  10 mg Oral q1800 Namiyah Grantham T, MD      . divalproex (DEPAKOTE ER) 24 hr tablet 1,500 mg  1,500 mg Oral QHS Merlyn Lot, MD   1,500 mg at 10/03/16 2223  . haloperidol (HALDOL) tablet 5 mg  5 mg Oral BID Goldie Dimmer T, MD      . lisinopril (PRINIVIL,ZESTRIL) tablet 10 mg  10 mg Oral Daily Phyllip Claw T, MD      . metFORMIN (GLUCOPHAGE) tablet 1,000 mg  1,000 mg Oral BID WC Quetzally Callas, Madie Reno, MD       Current Outpatient Prescriptions  Medication Sig Dispense Refill  . atorvastatin (LIPITOR) 10 MG tablet Take 10 mg by mouth at bedtime.    . divalproex (DEPAKOTE ER) 500 MG 24 hr tablet Take 500 mg by mouth at bedtime.     . haloperidol (HALDOL) 10 MG tablet Take 10 mg by mouth daily.     Marland Kitchen lisinopril (PRINIVIL,ZESTRIL) 10 MG tablet Take 10 mg by mouth daily.    . metFORMIN (GLUCOPHAGE) 500 MG tablet Take 1,000 mg by mouth 2 (two) times daily with a meal.    . Multiple Vitamin (THEREMS PO) Take 1 tablet by mouth daily.    . Vitamin D, Ergocalciferol, (DRISDOL) 50000 units CAPS capsule Take 50,000 Units by mouth every 7 (seven) days. Sunday      Musculoskeletal: Strength & Muscle Tone: within normal limits Gait & Station: normal Patient leans: N/A  Psychiatric Specialty Exam: Physical Exam  Nursing note and vitals reviewed. Constitutional: He appears well-developed and well-nourished.  HENT:  Head: Normocephalic and atraumatic.  Eyes: Conjunctivae are normal. Pupils are equal, round, and reactive to light.  Neck: Normal range of motion.  Cardiovascular: Regular rhythm and normal heart sounds.   Respiratory: Effort normal and breath sounds normal. No respiratory distress.  GI: Soft.  Musculoskeletal: Normal range of motion.  Neurological: He is alert.  Skin: Skin is warm and dry.  Psychiatric: His speech is normal. His affect is blunt. He is withdrawn. Thought  content is paranoid. Cognition and memory are normal. He expresses impulsivity. He expresses no homicidal and no suicidal ideation.    Review of Systems  Constitutional: Negative.   HENT: Negative.   Eyes: Negative.   Respiratory: Negative.   Cardiovascular: Negative.   Gastrointestinal: Negative.   Musculoskeletal: Negative.   Skin: Negative.   Neurological: Negative.   Psychiatric/Behavioral: Negative.     Blood pressure 139/84, pulse 86, temperature 98.1 F (36.7 C), temperature source Oral, resp. rate 18, height 6' (1.829 m), weight 104.3 kg (230 lb), SpO2 99 %.Body mass index is 31.19 kg/m.  General Appearance: Casual  Eye Contact:  Fair  Speech:  Clear and Coherent  Volume:  Decreased  Mood:  Euthymic  Affect:  Constricted  Thought Process:  Goal Directed  Orientation:  Full (Time, Place, and Person)  Thought Content:  Logical  Suicidal Thoughts:  No  Homicidal Thoughts:  No  Memory:  Immediate;   Good Recent;   Fair Remote;   Fair  Judgement:  Fair  Insight:  Fair  Psychomotor Activity:  Normal  Concentration:  Concentration: Fair  Recall:  [employer] of Knowledge:  Fair  Language:  Fair  Akathisia:  No  Handed:  Right  AIMS (if indicated):     Assets:  Desire for Improvement Financial Resources/Insurance Housing Physical Health Resilience Social Support  ADL's:  Intact  Cognition:  Impaired,  Mild  Sleep:        Treatment Plan Summary: Daily contact with patient to assess and evaluate symptoms and progress in treatment, Medication management and Plan I have been told that the social worker on the case has suggested to the group home that she will report the situation to state authorities although whether this is going to resolve the practical problem I don't know. For now we will continue to take care of the patient as best we can. I reviewed his medication list in of tried to make sure he is on the correct medicine for his blood pressure and cholesterol  and blood sugar as well as his psychiatric medicine. Slightly increased his Haldol to twice a day because of his ongoing irritability. No other change to treatment for now.  Disposition: Patient does not meet criteria for psychiatric inpatient admission. Supportive therapy provided about ongoing stressors.  Alethia Berthold, MD 10/04/2016 4:55 PM

## 2016-10-04 NOTE — ED Notes (Signed)
Pt upset, fearful he will be placed in an assisted living facility. RN offered reassurance to pt. Pt is still tearful. Maintained on 15 minute checks and observation by security camera for safety.

## 2016-10-04 NOTE — Clinical Social Work Note (Signed)
CSW has noted that patient was cleared by psychiatry and was to be discharged back to group home: [facility]. Initially it appears that hospital staff was told that due to transportation the group home could not pick up patient last night and this morning the group home stated that they could not take him back due to his behaviors. CSW contacted the group home owner: Latoya: [phone] and informed her that they had no choice but to come and pick patient up due to the 30 day eviction notice not being up and patient still being within his 30 days. Glee ArvinLatoya stated that she would "work on some things" and call me back. Meanwhile, CSW received call from [place] with DSS [facility]: [phone] and fax: [phone] stating he had been asking for an FL2 from the current group home for a while and not yet received one. He stated patient will have to return with the current group home until he has found another placement. He requested an FL2 which CSW did complete. CSW currently awaiting call back from the owner of [facility]: Latoya. York SpanielMonica Ladislaus Repsher MSW,LCSW [phone]

## 2016-10-04 NOTE — ED Notes (Signed)
PT VOLUNTARY PENDING PLACEMENT. 

## 2016-10-05 ENCOUNTER — Emergency Department: Payer: Medicaid Other

## 2016-10-05 MED ORDER — ATORVASTATIN CALCIUM 10 MG PO TABS: 10.0000 mg | ORAL_TABLET | Freq: Every day | ORAL | 0 refills | Status: DC

## 2016-10-05 MED ORDER — DIVALPROEX SODIUM ER 500 MG PO TB24: 1500.0000 mg | ORAL_TABLET | Freq: Every day | ORAL | 1 refills | Status: DC

## 2016-10-05 MED ORDER — HALOPERIDOL 5 MG PO TABS: 5.0000 mg | ORAL_TABLET | Freq: Two times a day (BID) | ORAL | 0 refills | Status: DC

## 2016-10-05 MED ORDER — LISINOPRIL 10 MG PO TABS: 10.0000 mg | ORAL_TABLET | Freq: Every day | ORAL | 0 refills | Status: DC

## 2016-10-05 MED ORDER — HALOPERIDOL 5 MG PO TABS: 5.0000 mg | ORAL_TABLET | Freq: Two times a day (BID) | ORAL | 1 refills | Status: DC

## 2016-10-05 MED ORDER — LISINOPRIL 10 MG PO TABS: 10.0000 mg | ORAL_TABLET | Freq: Every day | ORAL | 1 refills | Status: DC

## 2016-10-05 MED ORDER — METFORMIN HCL 1000 MG PO TABS: 1000.0000 mg | ORAL_TABLET | Freq: Two times a day (BID) | ORAL | 1 refills | Status: DC

## 2016-10-05 MED ORDER — METFORMIN HCL 1000 MG PO TABS: 1000.0000 mg | ORAL_TABLET | Freq: Two times a day (BID) | ORAL | 0 refills | Status: DC

## 2016-10-05 MED ORDER — ATORVASTATIN CALCIUM 10 MG PO TABS: 10.0000 mg | ORAL_TABLET | Freq: Every day | ORAL | 1 refills | Status: DC

## 2016-10-05 MED ORDER — DIVALPROEX SODIUM ER 500 MG PO TB24: 1500.0000 mg | ORAL_TABLET | Freq: Every day | ORAL | 0 refills | Status: DC

## 2016-10-05 NOTE — Progress Notes (Signed)
LCSW called and spoke to patient guardian [name] from [place]- He has sent out patient information to several facilities and will be checking up with them today.  [facility] willing [facility] patient but not until Tuesday May 29th/2018  Called [facility]  [phone] and asked for [facility] care coordinator- Timmothy SoursJoanna Shaver [phone] unable to reach did leave her a message and am awaiting call back.  Delta Air LinesClaudine Pavlos Yon LCSW [phone]

## 2016-10-05 NOTE — ED Notes (Signed)
Pt discharged to new group home. Group home owner given 7 days of pt's medications, prescriptions for mediations and discharge instructions.  All belongings returned to patient.

## 2016-10-05 NOTE — ED Notes (Signed)
Hourly rounding reveals patient sleeping in room. No complaints, stable, in no acute distress. Q15 minute rounds and monitoring via Security Cameras to continue. 

## 2016-10-05 NOTE — ED Notes (Signed)
Pt will be picked up at 1700 to go to new group home. Pt is accepting. Maintained on 15 minute checks and observation by security camera for safety.

## 2016-10-05 NOTE — ED Notes (Signed)
Pt up to use phone in dayroom. Pt is calm. No needs or concerns voiced at this time. Maintained on 15 minute checks and observation by security camera for safety.

## 2016-10-05 NOTE — Progress Notes (Signed)
John ReeveJosh will be requiring medications, chest x-ray, and will be going to [facility] [place]. Phone # [phone] fax  Cell phone number [phone]  As per patient DSS- [facility] Guardian [facility]  Called John C[facility]Grace [facility] and she will pick up patient at 5pm.  John [facility] [phone] unable to reach ( requesting to bring medication to hospital)    [facility] CenterClaudine Sloan Galentine LCSW [phone]

## 2016-10-05 NOTE — ED Notes (Signed)
Pt told he was going to [place]. Pt unhappy, stating he was refusing to go. Pt also refusing a chest x-ray.  Maintained on 15 minute checks and observation by security camera for safety.

## 2016-10-05 NOTE — ED Notes (Signed)
LCSW spoke with patient. Pt understands he cannot refuse to go, but stated he is upset with this news. Pt then demanded a shower before allowing a chest x-ray. Maintained on 15 minute checks and observation by security camera for safety.

## 2016-10-05 NOTE — ED Notes (Signed)
Report to oncoming shift.

## 2016-10-05 NOTE — ED Notes (Signed)
Hourly rounding reveals patient sleeping room. No complaints, stable, in no acute distress. Q15 minute rounds and monitoring via Security Cameras to continue. 

## 2016-10-05 NOTE — ED Notes (Signed)
Pt laying in bed awake. RN delivered lunch tray and removed old trays from room. No needs or concerns voiced at this time. Maintained on 15 minute checks and observation by security camera for safety.

## 2016-10-05 NOTE — ED Provider Notes (Signed)
-----------------------------------------   6:37 AM on 10/05/2016 -----------------------------------------  ----------------------------------------- 6:39 AM on 10/05/2016 -----------------------------------------   Blood pressure 120/77, pulse 63, temperature 97.4 F (36.3 C), temperature source Oral, resp. rate 16, height 6' (1.829 m), weight 104.3 kg (230 lb), SpO2 100 %.  The patient had no acute events since last update.  Patient has been seen by psychiatry, does not meet admission criteria for psychiatry. However the patient is not able to return back to his group home due to safety concerns. Clinical social workers currently involved attempting to find new placement for the patient. Patient is here voluntarily at this point.    Minna AntisPaduchowski, Tayva Easterday, MD 10/05/16 [phone]

## 2016-10-05 NOTE — ED Notes (Signed)
Patient asleep in room. No noted distress or abnormal behavior. Will continue 15 minute checks and observation by security cameras for safety. 

## 2016-10-05 NOTE — ED Notes (Signed)
Pt compliant with medications. Waiting to discharge to new group home. Maintained on 15 minute checks and observation by security camera for safety.

## 2016-10-05 NOTE — ED Notes (Signed)
Pt dressing for discharge. Group home owner is here.

## 2016-10-05 NOTE — ED Notes (Signed)
Pt cooperative, compliant with medications. Pt denies SI/HI and AVH. No needs or concerns voiced at this time. Maintained on 15 minute checks and observation by security camera for safety.

## 2016-10-05 NOTE — ED Notes (Signed)
Patient resting quietly in room. No noted distress or abnormal behaviors noted. Will continue 15 minute checks and observation by security camera for safety. 

## 2016-10-06 ENCOUNTER — Emergency Department
Admission: EM | Admit: 2016-10-06 | Discharge: 2016-10-08 | Disposition: A | Discharge status: Other Acute Inpatient Hospital | Payer: Medicaid Other | Attending: Emergency Medicine | Admitting: Emergency Medicine

## 2016-10-06 ENCOUNTER — Encounter: Payer: Self-pay | Admitting: Emergency Medicine

## 2016-10-06 DIAGNOSIS — R45851 Suicidal ideations: Secondary | ICD-10-CM

## 2016-10-06 DIAGNOSIS — E119 Type 2 diabetes mellitus without complications: Secondary | ICD-10-CM | POA: Diagnosis not present

## 2016-10-06 DIAGNOSIS — Z79899 Other long term (current) drug therapy: Secondary | ICD-10-CM | POA: Insufficient documentation

## 2016-10-06 DIAGNOSIS — F17210 Nicotine dependence, cigarettes, uncomplicated: Secondary | ICD-10-CM | POA: Insufficient documentation

## 2016-10-06 DIAGNOSIS — I10 Essential (primary) hypertension: Secondary | ICD-10-CM | POA: Diagnosis present

## 2016-10-06 DIAGNOSIS — Z7984 Long term (current) use of oral hypoglycemic drugs: Secondary | ICD-10-CM | POA: Diagnosis not present

## 2016-10-06 DIAGNOSIS — F17200 Nicotine dependence, unspecified, uncomplicated: Secondary | ICD-10-CM | POA: Diagnosis present

## 2016-10-06 DIAGNOSIS — F250 Schizoaffective disorder, bipolar type: Secondary | ICD-10-CM | POA: Diagnosis present

## 2016-10-06 DIAGNOSIS — F329 Major depressive disorder, single episode, unspecified: Secondary | ICD-10-CM | POA: Insufficient documentation

## 2016-10-06 DIAGNOSIS — Z046 Encounter for general psychiatric examination, requested by authority: Secondary | ICD-10-CM | POA: Diagnosis present

## 2016-10-06 DIAGNOSIS — E785 Hyperlipidemia, unspecified: Secondary | ICD-10-CM | POA: Diagnosis present

## 2016-10-06 LAB — GLUCOSE, CAPILLARY: GLUCOSE-CAPILLARY: 113 mg/dL — AB (ref 65–99)

## 2016-10-06 NOTE — ED Notes (Signed)
Report given to SOC MD.  

## 2016-10-06 NOTE — ED Provider Notes (Signed)
[facility] Emergency Department Provider Note   ____________________________________________    I have reviewed the triage vital signs and the nursing notes.   HISTORY  Chief Complaint Psychiatric Evaluation     HPI John [facility]eland is a 26 y.o. male who presents with complaints of suicidal ideation. Patient reports this started today. He reports he has not tried to hurt himself. Denies self injury. He states he is suicidal because "nothing is going right ". He says he lost his ID and a [employer] card and he doesn't like his group home. He does not have a plan for how he would harm himself. Patient was recently in the emergency department here several days ago. He has a history of diabetes and schizoaffective disorder. He has no physical complaints   Past Medical History:  Diagnosis Date  . Depression   . Diabetes mellitus without complication [facility](HCC)     Patient Active Problem List   Diagnosis Date Noted  . Schizoaffective disorder (HCC) 11/30/2015    History reviewed. No pertinent surgical history.  Prior to Admission medications   Medication Sig Start Date End Date Taking? Authorizing Provider  atorvastatin (LIPITOR) 10 MG tablet Take 10 mg by mouth at bedtime.    [provider]  atorvastatin (LIPITOR) 10 MG tablet Take 1 tablet (10 mg total) by mouth daily at 6 PM. 10/05/16   Clapacs, Jackquline DenmarkJohn T, MD  divalproex (DEPAKOTE ER) 500 MG 24 hr tablet Take 500 mg by mouth at bedtime.     [provider]  divalproex (DEPAKOTE ER) 500 MG 24 hr tablet Take 3 tablets (1,500 mg total) by mouth at bedtime. 10/05/16   Clapacs, Jackquline DenmarkJohn T, MD  haloperidol (HALDOL) 10 MG tablet Take 10 mg by mouth daily.     [provider]  haloperidol (HALDOL) 5 MG tablet Take 1 tablet (5 mg total) by mouth 2 (two) times daily. 10/05/16   Clapacs, Jackquline DenmarkJohn T, MD  lisinopril (PRINIVIL,ZESTRIL) 10 MG tablet Take 10 mg by mouth daily.    [provider]  lisinopril (PRINIVIL,ZESTRIL) 10 MG tablet Take 1 tablet (10 mg total) by mouth daily. 10/06/16   Clapacs, Jackquline DenmarkJohn T, MD  metFORMIN (GLUCOPHAGE) 1000 MG tablet Take 1 tablet (1,000 mg total) by mouth 2 (two) times daily with a meal. 10/05/16   Clapacs, Jackquline DenmarkJohn T, MD  metFORMIN (GLUCOPHAGE) 500 MG tablet Take 1,000 mg by mouth 2 (two) times daily with a meal.    [provider]  Multiple Vitamin (THEREMS PO) Take 1 tablet by mouth daily.    [provider]  Vitamin D, Ergocalciferol, (DRISDOL) 50000 units CAPS capsule Take 50,000 Units by mouth every 7 (seven) days. Sunday    [provider]     Allergies Patient has no known allergies.  History reviewed. No pertinent family history.  Social History Social History  Substance Use Topics  . Smoking status: Current Every Day Smoker    Packs/day: 0.50    Types: Cigarettes  . Smokeless tobacco: Not on file  . Alcohol use No    Review of Systems  Constitutional: No fever/chills Eyes: No visual changes.  ENT: No sore throat. Cardiovascular: Denies chest pain. Respiratory: Denies shortness of breath. Gastrointestinal: No abdominal pain.  No nausea, no vomiting.   Genitourinary: Negative for dysuria. Musculoskeletal: Negative for back pain. Skin: Negative for rash. Neurological: Negative for headaches or weakness   ____________________________________________   PHYSICAL EXAM:  VITAL SIGNS: ED Triage Vitals  Enc Vitals Group     BP      Pulse      Resp      Temp      Temp src      SpO2      Weight      Height      Head Circumference      Peak Flow      Pain Score      Pain Loc      Pain Edu?      Excl. in GC?     Constitutional: Alert and oriented. No acute distress. Pleasant and interactive Eyes: Conjunctivae are normal.  Head: Atraumatic. Nose: No congestion/rhinnorhea. Mouth/Throat: Mucous membranes are moist.   Neck:  Painless ROM Cardiovascular: Normal rate, regular rhythm. Grossly  normal heart sounds.  Good peripheral circulation. Respiratory: Normal respiratory effort.  No retractions. Lungs CTAB. Gastrointestinal: Soft and nontender. No distention.  No CVA tenderness. Genitourinary: deferred Musculoskeletal: No lower extremity tenderness nor edema.  Warm and well perfused Neurologic:  Normal speech and language. No gross focal neurologic deficits are appreciated.  Skin:  Skin is warm, dry and intact. No rash noted. Psychiatric: Mood Is stable, flat affect speech and behavior are appropriate this time  ____________________________________________   LABS (all labs ordered are listed, but only abnormal results are displayed)  Labs Reviewed  GLUCOSE, CAPILLARY - Abnormal; Notable for the following:       Result Value   Glucose-Capillary 113 (*)    All other components within normal limits   ____________________________________________  EKG  None ____________________________________________  RADIOLOGY  None ____________________________________________   PROCEDURES  Procedure(s) performed: No    Critical Care performed: No ____________________________________________   INITIAL IMPRESSION / ASSESSMENT AND PLAN / ED COURSE  Pertinent labs & imaging results that were available during my care of the patient were reviewed by me and considered in my medical decision making (see chart for details).  Patient well-appearing and in no acute distress. Recent labs drawn in the ED were unremarkable. No indication for repeat lab draw. Patient is medically cleared based on my exam. Do not feel he requires involuntary commitment I will consult SSE and TTS  ----------------------------------------- 9:46 PM on 10/06/2016 -----------------------------------------  Discussed with [facility] psychiatrist, they recommend inpatient evaluation  Patient remains voluntary      ____________________________________________   FINAL CLINICAL IMPRESSION(S) / ED  DIAGNOSES  Final diagnoses:  Suicidal ideation      NEW MEDICATIONS STARTED DURING THIS VISIT:  New Prescriptions   No medications on file     Note:  This document was prepared using Dragon voice recognition software and may include unintentional dictation errors.    Jene Every, MD 10/06/16 2148

## 2016-10-06 NOTE — ED Notes (Signed)

## 2016-10-06 NOTE — ED Notes (Signed)
BEHAVIORAL HEALTH ROUNDING  Patient sleeping: No.  Patient alert and oriented: yes  Behavior appropriate: Yes. ; If no, describe:  Nutrition and fluids offered: Yes  Toileting and hygiene offered: Yes  Sitter present: not applicable, Q 15 min safety rounds and observation.  Law enforcement present: Yes ODS  

## 2016-10-06 NOTE — ED Notes (Signed)
[facility] from [facility], [address] [place], [place]

## 2016-10-06 NOTE — ED Triage Notes (Signed)
[facility] for SI that pt states started yesterday due to loosing ID, SS card, not liking group home. Pt says he was thinking of using blanket as noose and hanging self.

## 2016-10-06 NOTE — ED Notes (Signed)
BEHAVIORAL HEALTH ROUNDING Patient sleeping: Yes.   Patient alert and oriented: not applicable SLEEPING Behavior appropriate: Yes.  ; If no, describe: SLEEPING Nutrition and fluids offered: No SLEEPING Toileting and hygiene offered: NoSLEEPING Sitter present: not applicable, Q 15 min safety rounds and observation. Law enforcement present: Yes ODS 

## 2016-10-06 NOTE — ED Notes (Signed)
SOC consult completed. Camera removed from room.  

## 2016-10-06 NOTE — BH Assessment (Signed)
Assessment Note  John [facility]eland is an 26 y.o. male presenting from [facility] c/o suicidal ideations without intent.  Pt reports thoughts of taking a blanket and hanging self but no intentions.  Pt is upset because he is at a new group home and does not like it.  He also states he is upset because he cannot get the job at [employer] because it is too far from the group home.  Pt denies HI and any auditory/visual hallucinations.  He denies drug/alcohol use.  Pt has not shown any aggression during his stay in the ED.  Diagnosis: Depression  Past Medical History:  Past Medical History:  Diagnosis Date  . Depression   . Diabetes mellitus without complication (HCC)     History reviewed. No pertinent surgical history.  Family History: History reviewed. No pertinent family history.  Social History:  reports that he has been smoking Cigarettes.  He has been smoking about 0.50 packs per day. He does not have any smokeless tobacco history on file. He reports that he does not drink alcohol or use drugs.  Additional Social History:  Alcohol / Drug Use Pain Medications: See PTA Prescriptions: See PTA Over the Counter: See PTA History of alcohol / drug use?: No history of alcohol / drug abuse  CIWA: CIWA-Ar BP: 134/85 Pulse Rate: 88 COWS:    Allergies: No Known Allergies  Home Medications:  (Not in a hospital admission)  OB/GYN Status:  No LMP for male patient.  General Assessment Data Location of Assessment: [facility] ED TTS Assessment: In system Is this a Tele or Face-to-Face Assessment?: Face-to-Face Is this an Initial Assessment or a Re-assessment for this encounter?: Initial Assessment Marital status: Single Maiden name: na Is patient pregnant?: No Pregnancy Status: No Living Arrangements: Group Home ([facility]) Can pt return to current living arrangement?: Yes Admission Status: Voluntary Is patient capable of signing voluntary admission?: No Referral Source:  Self/Family/Friend Insurance type: Medicaid     Crisis Care Plan Living Arrangements: Group Home ([facility]) Legal Guardian: Other: Chari [place] DSS) Name of Psychiatrist: Pearletha Alfred Name of Therapist: Pearletha Alfred  Education Status Is patient currently in school?: No Current Grade: na Highest grade of school patient has completed: 8th Grade Name of school: N/A Contact person: N/A  Risk to self with the past 6 months Suicidal Ideation: Yes-Currently Present Has patient been a risk to self within the past 6 months prior to admission? : No Suicidal Intent: No Has patient had any suicidal intent within the past 6 months prior to admission? : No Is patient at risk for suicide?: No Suicidal Plan?: No Has patient had any suicidal plan within the past 6 months prior to admission? : No Access to Means: No What has been your use of drugs/alcohol within the last 12 months?:  (Pt denies drug use) Previous Attempts/Gestures: No How many times?: 0 Other Self Harm Risks: None reported Triggers for Past Attempts: None known Intentional Self Injurious Behavior: None Family Suicide History: Unknown Recent stressful life event(s): Conflict (Comment) (Pt does not like his group home placement) Persecutory voices/beliefs?: No Depression: Yes Depression Symptoms: Feeling worthless/self pity Substance abuse history and/or treatment for substance abuse?: No Suicide prevention information given to non-admitted patients: Not applicable  Risk to Others within the past 6 months Homicidal Ideation: No Does patient have any lifetime risk of violence toward others beyond the six months prior to admission? : No Thoughts of Harm to Others: No Current Homicidal  Intent: No Current Homicidal Plan: No Access to Homicidal Means: No Identified Victim: na History of harm to others?: No Assessment of Violence: None Noted Violent Behavior Description: None reported Does patient have access to  weapons?: No Criminal Charges Pending?: No Does patient have a court date: No Is patient on probation?: No  Psychosis Hallucinations: None noted Delusions: None noted  Mental Status Report Appearance/Hygiene: In scrubs Eye Contact: Fair Motor Activity: Freedom of movement Speech: Logical/coherent Level of Consciousness: Alert Mood: Irritable Affect: Irritable Anxiety Level: Minimal Thought Processes: Relevant Judgement: Partial Orientation: Person, Place, Time, Situation, Appropriate for developmental age Obsessive Compulsive Thoughts/Behaviors: None  Cognitive Functioning Concentration: Good Memory: Recent Intact, Remote Intact IQ: Average Insight: see judgement above Impulse Control: Fair Appetite: Good Weight Loss: 0 Weight Gain: 0 Sleep: No Change Total Hours of Sleep: 12 Vegetative Symptoms: None  ADLScreening [facility] Assessment Services) Patient's cognitive ability adequate to safely complete daily activities?: Yes Patient able to express need for assistance with ADLs?: Yes Independently performs ADLs?: Yes (appropriate for developmental age)  Prior Inpatient Therapy Prior Inpatient Therapy: No Prior Therapy Dates: na Prior Therapy Facilty/Provider(s): na Reason for Treatment: na  Prior Outpatient Therapy Prior Outpatient Therapy: No Prior Therapy Dates: na Prior Therapy Facilty/Provider(s): na Reason for Treatment: na Does patient have an ACCT team?: No Does patient have Intensive In-House Services?  : No Does patient have [facility] services? : No Does patient have P4CC services?: No  ADL Screening (condition at time of admission) Patient's cognitive ability adequate to safely complete daily activities?: Yes Patient able to express need for assistance with ADLs?: Yes Independently performs ADLs?: Yes (appropriate for developmental age)       Abuse/Neglect Assessment (Assessment to be complete while patient is alone) Physical Abuse: Denies Verbal  Abuse: Denies Sexual Abuse: Denies Exploitation of patient/patient's resources: Denies Self-Neglect: Denies Values / Beliefs Cultural Requests During Hospitalization: None Spiritual Requests During Hospitalization: None Consults Spiritual Care Consult Needed: No Social Work Consult Needed: No Merchant navy officerAdvance Directives (For Healthcare) Does Patient Have a Medical Advance Directive?: No    Additional Information 1:1 In Past 12 Months?: No CIRT Risk: No Elopement Risk: No Does patient have medical clearance?: Yes     Disposition:  Disposition Initial Assessment Completed for this Encounter: Yes Disposition of Patient: Other dispositions Other disposition(s): Other (Comment) (Pending Psych MD consult)  On Site Evaluation by:   Reviewed with Physician:    Eulla Kochanowski C Damarkus Balis 10/06/2016 8:30 PM

## 2016-10-06 NOTE — ED Notes (Signed)
PT  VOL/  PENDING  PLACEMENT 

## 2016-10-06 NOTE — ED Notes (Signed)
Camera [facility]. Pt informed of process and is understanding.

## 2016-10-07 ENCOUNTER — Encounter: Payer: Self-pay | Admitting: Psychiatry

## 2016-10-07 DIAGNOSIS — I10 Essential (primary) hypertension: Secondary | ICD-10-CM | POA: Diagnosis present

## 2016-10-07 DIAGNOSIS — E785 Hyperlipidemia, unspecified: Secondary | ICD-10-CM | POA: Diagnosis present

## 2016-10-07 DIAGNOSIS — E119 Type 2 diabetes mellitus without complications: Secondary | ICD-10-CM

## 2016-10-07 DIAGNOSIS — F17200 Nicotine dependence, unspecified, uncomplicated: Secondary | ICD-10-CM | POA: Diagnosis present

## 2016-10-07 MED ORDER — HALOPERIDOL 5 MG PO TABS
10.0000 mg | ORAL_TABLET | Freq: Two times a day (BID) | ORAL | Status: DC
  Administered 2016-10-07: 10 mg via ORAL
  Filled 2016-10-07: qty 2

## 2016-10-07 MED ORDER — DIVALPROEX SODIUM 500 MG PO DR TAB
1500.0000 mg | DELAYED_RELEASE_TABLET | Freq: Every day | ORAL | Status: DC
  Administered 2016-10-07: 1500 mg via ORAL
  Filled 2016-10-07: qty 3

## 2016-10-07 NOTE — ED Notes (Signed)
Report was received from Reece LeaderAnn C., RN; Pt. Verbalizes  complaints of not liking his current group home placement; verbalizes having S.I.; with a plan to hang himself; and denies having any Hi. Continue to monitor with 15 min. Monitoring.

## 2016-10-07 NOTE — ED Notes (Signed)
Pt given dinner tray.

## 2016-10-07 NOTE — ED Notes (Signed)
Report was received from Amy B., RN; Pt. Verbalizes no complaints or distress; verbalizes having S.I.; with a plan to hang himself; denies having Hi. Continue to monitor with 15 min. Monitoring.

## 2016-10-07 NOTE — ED Notes (Signed)
Pt given breakfast tray. Patient resting quietly in room. No noted distress or abnormal behaviors noted. Will continue 15 minute checks and observation by security camera for safety.

## 2016-10-07 NOTE — ED Provider Notes (Signed)
-----------------------------------------   7:17 AM on 10/07/2016 -----------------------------------------   Blood pressure 137/88, pulse (!) 56, temperature 98 F (36.7 C), temperature source Oral, resp. rate 18, height 6' (1.829 m), weight 95.3 kg (210 lb), SpO2 100 %.  The patient had no acute events since last update.  Calm and cooperative at this time.  Disposition is pending per Psychiatry/Behavioral Medicine team recommendations.     Nita SickleVeronese, Pierron, MD 10/07/16 [phone]

## 2016-10-07 NOTE — ED Notes (Signed)
Report to Margaret, RN in ED BHU.  

## 2016-10-07 NOTE — ED Notes (Signed)
Patient is voluntary and is pending placement. 

## 2016-10-07 NOTE — ED Notes (Signed)
Pt up to bathroom, then straight back to bed. Pt laying in bed under blankets. Maintained on 15 minute checks and observation by security camera for safety.

## 2016-10-07 NOTE — BH Assessment (Signed)
Clinician unable to reach guardian for support papers. Unable to reach EDP. Pt RN (Amy) informed.

## 2016-10-07 NOTE — ED Notes (Signed)
BEHAVIORAL HEALTH ROUNDING Patient sleeping: Yes.   Patient alert and oriented: not applicable SLEEPING Behavior appropriate: Yes.  ; If no, describe: SLEEPING Nutrition and fluids offered: No SLEEPING Toileting and hygiene offered: NoSLEEPING Sitter present: not applicable, Q 15 min safety rounds and observation. Law enforcement present: Yes ODS 

## 2016-10-07 NOTE — BH Assessment (Addendum)
[facility] by Dr. Jennet MaduroPucilowska.  Attending Physician will be Dr. Ardyth HarpsHernandez.   Patient has [facility] room 306A, [facility] Charge [facility] [place].   ER staff is aware of the admission Gloris Manchester( Traci, ER Sect.; Dr. Lamont Snowballifenbark, ER MD; Amy Patient's Nurse & Dedra SkeensGwen, Patient Access).  Patient has legal guardian is unable to complete own support papers. TTS attempting to contact guardian for voluntary signature. Pt is unable to transfer to unit w/o consent or IVC. Amy, RN informed.

## 2016-10-07 NOTE — BH Assessment (Signed)
Pt attempted to contact pt guardian Sharl Ma(Marty [phone]) for consent paperwork. Weber Cooks. Guardians voicemail directed after hours calls for immediate assistance to [phone]. Clinician called number as directed and received a voicemail stated agency was closed for the weekend.

## 2016-10-07 NOTE — ED Notes (Signed)
Pt given extra blanket and allowed to use remote. Pt pleasant and cooperative, but still minimal interaction. Maintained on 15 minute checks and observation by security camera for safety.

## 2016-10-07 NOTE — ED Notes (Signed)
Pt laying in bed all day. Minimal interaction with RN. Maintained on 15 minute checks and observation by security camera for safety.

## 2016-10-07 NOTE — ED Notes (Signed)
BEHAVIORAL HEALTH ROUNDING  Patient sleeping: No.  Patient alert and oriented: yes  Behavior appropriate: Yes. ; If no, describe:  Nutrition and fluids offered: Yes  Toileting and hygiene offered: Yes  Sitter present: not applicable, Q 15 min safety rounds and observation.  Law enforcement present: Yes ODS  

## 2016-10-07 NOTE — ED Notes (Signed)
Patient asleep in room. No noted distress or abnormal behavior. Will continue 15 minute checks and observation by security cameras for safety. 

## 2016-10-07 NOTE — ED Notes (Signed)
ED BHU PLACEMENT JUSTIFICATION Is the patient under IVC or is there intent for IVC: No. Is the patient medically cleared: Yes.   Is there vacancy in the ED BHU: Yes.   Is the population mix appropriate for patient: Yes.   Is the patient awaiting placement in inpatient or outpatient setting: Yes.   Has the patient had a psychiatric consult: Yes.   Survey of unit performed for contraband, proper placement and condition of furniture, tampering with fixtures in bathroom, shower, and each patient room: Yes.   APPEARANCE/BEHAVIOR calm, cooperative and adequate rapport can be established NEURO ASSESSMENT Orientation: place and person Hallucinations: No.None noted (Hallucinations) Speech: Normal Gait: normal RESPIRATORY ASSESSMENT Normal expansion.  Clear to auscultation.  No rales, rhonchi, or wheezing. CARDIOVASCULAR ASSESSMENT regular rate and rhythm, S1, S2 normal, no murmur, click, rub or gallop GASTROINTESTINAL ASSESSMENT soft, nontender, BS WNL, no r/g EXTREMITIES normal strength, tone, and muscle mass PLAN OF CARE Provide calm/safe environment. Vital signs assessed twice daily. ED BHU Assessment once each 12-hour shift. Collaborate with intake RN daily or as condition indicates. Assure the ED provider has rounded once each shift. Provide and encourage hygiene. Provide redirection as needed. Assess for escalating behavior; address immediately and inform ED provider.  Assess family dynamic and appropriateness for visitation as needed: Yes.   Educate the patient/family about BHU procedures/visitation: Yes.   

## 2016-10-07 NOTE — ED Notes (Signed)
PT  VOL/  PENDING  PLACEMENT 

## 2016-10-07 NOTE — BH Assessment (Signed)
Pt chart currently under [facility] for [facility].

## 2016-10-07 NOTE — ED Notes (Signed)
Pt given lunch tray. Pt laying in bed, did not acknowledge RN. Maintained on 15 minute checks and observation by security camera for safety.

## 2016-10-07 NOTE — BH Assessment (Signed)
[facility] by Dr.[facility]uc[facility]. Bed assignment pending.

## 2016-10-07 NOTE — Progress Notes (Signed)
[employer] :Timmothy SoursJoanna Shaver [phone] unable to reach did leave her a message and am awaiting call back.  [facility] [place]. Phone # [phone] fax  Cell phone number [phone] ( unable to reach at both numbers ( these numbers have not been established) Unable to collect information if patient can return  CSW called [name] [place] DSS [place]: [phone]  and left a detailed message    Arrie SenateClaudine Hobart Marte LCSW [phone]

## 2016-10-08 ENCOUNTER — Inpatient Hospital Stay
Admission: AD | Admit: 2016-10-08 | Discharge: 2016-10-15 | DRG: 885 | Disposition: A | Discharge status: Home / Self Care | Payer: Medicaid Other | Attending: Psychiatry | Admitting: Psychiatry

## 2016-10-08 DIAGNOSIS — Z7984 Long term (current) use of oral hypoglycemic drugs: Secondary | ICD-10-CM | POA: Diagnosis not present

## 2016-10-08 DIAGNOSIS — E119 Type 2 diabetes mellitus without complications: Secondary | ICD-10-CM | POA: Diagnosis present

## 2016-10-08 DIAGNOSIS — E785 Hyperlipidemia, unspecified: Secondary | ICD-10-CM | POA: Diagnosis present

## 2016-10-08 DIAGNOSIS — R45851 Suicidal ideations: Secondary | ICD-10-CM | POA: Diagnosis present

## 2016-10-08 DIAGNOSIS — Z5320 Procedure and treatment not carried out because of patient's decision for unspecified reasons: Secondary | ICD-10-CM | POA: Diagnosis present

## 2016-10-08 DIAGNOSIS — F250 Schizoaffective disorder, bipolar type: Secondary | ICD-10-CM | POA: Diagnosis not present

## 2016-10-08 DIAGNOSIS — Z79899 Other long term (current) drug therapy: Secondary | ICD-10-CM | POA: Diagnosis not present

## 2016-10-08 DIAGNOSIS — R4587 Impulsiveness: Secondary | ICD-10-CM | POA: Diagnosis present

## 2016-10-08 DIAGNOSIS — F17210 Nicotine dependence, cigarettes, uncomplicated: Secondary | ICD-10-CM | POA: Diagnosis present

## 2016-10-08 DIAGNOSIS — G4700 Insomnia, unspecified: Secondary | ICD-10-CM | POA: Diagnosis present

## 2016-10-08 DIAGNOSIS — I10 Essential (primary) hypertension: Secondary | ICD-10-CM | POA: Diagnosis present

## 2016-10-08 DIAGNOSIS — F419 Anxiety disorder, unspecified: Secondary | ICD-10-CM | POA: Diagnosis present

## 2016-10-08 DIAGNOSIS — F17200 Nicotine dependence, unspecified, uncomplicated: Secondary | ICD-10-CM | POA: Diagnosis present

## 2016-10-08 MED ORDER — LORAZEPAM 2 MG PO TABS
2.0000 mg | ORAL_TABLET | Freq: Three times a day (TID) | ORAL | Status: DC | PRN
  Administered 2016-10-08: 2 mg via ORAL
  Filled 2016-10-08: qty 1

## 2016-10-08 MED ORDER — ATORVASTATIN CALCIUM 20 MG PO TABS
10.0000 mg | ORAL_TABLET | Freq: Every day | ORAL | Status: DC
  Administered 2016-10-08 – 2016-10-14 (×7): 10 mg via ORAL
  Filled 2016-10-08 (×7): qty 1

## 2016-10-08 MED ORDER — ACETAMINOPHEN 325 MG PO TABS
650.0000 mg | ORAL_TABLET | Freq: Four times a day (QID) | ORAL | Status: DC | PRN
  Administered 2016-10-12: 650 mg via ORAL
  Filled 2016-10-08: qty 2

## 2016-10-08 MED ORDER — DIVALPROEX SODIUM ER 500 MG PO TB24
1500.0000 mg | ORAL_TABLET | Freq: Every day | ORAL | Status: DC
  Administered 2016-10-08 – 2016-10-14 (×7): 1500 mg via ORAL
  Filled 2016-10-08 (×8): qty 3

## 2016-10-08 MED ORDER — OLANZAPINE 10 MG IM SOLR
10.0000 mg | Freq: Two times a day (BID) | INTRAMUSCULAR | Status: DC | PRN
Start: 2016-10-08 — End: 2016-10-09

## 2016-10-08 MED ORDER — MAGNESIUM HYDROXIDE 400 MG/5ML PO SUSP: 30.0000 mL | Freq: Every day | ORAL | Status: DC | PRN

## 2016-10-08 MED ORDER — ALUM & MAG HYDROXIDE-SIMETH 200-200-20 MG/5ML PO SUSP: 30.0000 mL | ORAL | Status: DC | PRN

## 2016-10-08 MED ORDER — LISINOPRIL 5 MG PO TABS
10.0000 mg | ORAL_TABLET | Freq: Every day | ORAL | Status: DC
  Administered 2016-10-08 – 2016-10-15 (×8): 10 mg via ORAL
  Filled 2016-10-08 (×8): qty 2

## 2016-10-08 MED ORDER — METFORMIN HCL 500 MG PO TABS
1000.0000 mg | ORAL_TABLET | Freq: Two times a day (BID) | ORAL | Status: DC
  Administered 2016-10-08 – 2016-10-15 (×15): 1000 mg via ORAL
  Filled 2016-10-08 (×15): qty 2

## 2016-10-08 MED ORDER — NICOTINE 21 MG/24HR TD PT24
21.0000 mg | MEDICATED_PATCH | Freq: Every day | TRANSDERMAL | Status: DC
  Filled 2016-10-08 (×2): qty 1

## 2016-10-08 MED ORDER — TRAZODONE HCL 100 MG PO TABS
100.0000 mg | ORAL_TABLET | Freq: Every day | ORAL | Status: DC
  Administered 2016-10-08 – 2016-10-14 (×7): 100 mg via ORAL
  Filled 2016-10-08 (×7): qty 1

## 2016-10-08 MED ORDER — OLANZAPINE 5 MG PO TBDP: 10.0000 mg | ORAL_TABLET | Freq: Two times a day (BID) | ORAL | Status: DC | PRN

## 2016-10-08 MED ORDER — HALOPERIDOL 5 MG PO TABS
5.0000 mg | ORAL_TABLET | Freq: Two times a day (BID) | ORAL | Status: DC
  Administered 2016-10-08 – 2016-10-09 (×3): 5 mg via ORAL
  Filled 2016-10-08 (×3): qty 1

## 2016-10-08 NOTE — Progress Notes (Signed)
Pt to nurse station for evening medications with compliance. Continues to verbalizes suicidal ideation with plan "I was going to hang myself." Pt verbally contracts for safety in the hospital, states, " I will tell staff if I feel the need to harm myself." Pt appears flat, depressed. Minimal interaction, forwards little. Reports "I've been sleeping in my room all day." Safety maintained. Support and encouragement provided Will continue to monitor.

## 2016-10-08 NOTE — Tx Team (Signed)
Interdisciplinary Treatment and Diagnostic Plan Update  10/08/2016 Time of Session: [place] MRN: [id number]  Principal Diagnosis: Schizoaffective disorder, bipolar type [facility])  Secondary Diagnoses: Principal Problem:   Schizoaffective disorder, bipolar type ([place]) Active Problems:   Tobacco use disorder   Diabetes ([place])   HTN (hypertension)   Dyslipidemia   Current Medications:  Current Facility-Administered Medications  Medication Dose Route Frequency Provider Last Rate Last Dose  . acetaminophen (TYLENOL) tablet 650 mg  650 mg Oral Q6H PRN Pucilowska, Jolanta B, MD      . alum & mag hydroxide-simeth (MAALOX/MYLANTA) 200-200-20 MG/5ML suspension 30 mL  30 mL Oral Q4H PRN Pucilowska, Jolanta B, MD      . atorvastatin (LIPITOR) tablet 10 mg  10 mg Oral q1800 Pucilowska, Jolanta B, MD      . divalproex (DEPAKOTE ER) 24 hr tablet 1,500 mg  1,500 mg Oral QHS Pucilowska, Jolanta B, MD      . haloperidol (HALDOL) tablet 5 mg  5 mg Oral BID Pucilowska, Jolanta B, MD   5 mg at 10/08/16 1111  . lisinopril (PRINIVIL,ZESTRIL) tablet 10 mg  10 mg Oral Daily Pucilowska, Jolanta B, MD   10 mg at 10/08/16 1112  . LORazepam (ATIVAN) tablet 2 mg  2 mg Oral TID PRN Hildred Priest, MD      . magnesium hydroxide (MILK OF MAGNESIA) suspension 30 mL  30 mL Oral Daily PRN Pucilowska, Jolanta B, MD      . metFORMIN (GLUCOPHAGE) tablet 1,000 mg  1,000 mg Oral BID WC Pucilowska, Jolanta B, MD   1,000 mg at 10/08/16 1112  . nicotine (NICODERM CQ - dosed in mg/24 hours) patch 21 mg  21 mg Transdermal Q0600 Pucilowska, Jolanta B, MD      . OLANZapine (ZYPREXA) injection 10 mg  10 mg Intramuscular BID PRN Hildred Priest, MD       Or  . OLANZapine zydis (ZYPREXA) disintegrating tablet 10 mg  10 mg Oral BID PRN Hildred Priest, MD      . traZODone (DESYREL) tablet 100 mg  100 mg Oral QHS Pucilowska, Jolanta B, MD       PTA Medications: Prescriptions Prior to Admission   Medication Sig Dispense Refill Last Dose  . atorvastatin (LIPITOR) 10 MG tablet Take 1 tablet (10 mg total) by mouth daily at 6 PM. 7 tablet 0   . divalproex (DEPAKOTE ER) 500 MG 24 hr tablet Take 3 tablets (1,500 mg total) by mouth at bedtime. 21 tablet 0   . haloperidol (HALDOL) 5 MG tablet Take 1 tablet (5 mg total) by mouth 2 (two) times daily. 14 tablet 0   . lisinopril (PRINIVIL,ZESTRIL) 10 MG tablet Take 1 tablet (10 mg total) by mouth daily. 7 tablet 0   . metFORMIN (GLUCOPHAGE) 1000 MG tablet Take 1 tablet (1,000 mg total) by mouth 2 (two) times daily with a meal. 14 tablet 0   . Multiple Vitamin (THEREMS PO) Take 1 tablet by mouth daily.   10/02/2016 at 0800  . Vitamin D, Ergocalciferol, (DRISDOL) 50000 units CAPS capsule Take 50,000 Units by mouth every 7 (seven) days. 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   Physician Treatment Plan for Primary Diagnosis: Schizoaffective disorder, bipolar type ([place]) Long Term Goal(s): Improvement in symptoms so as ready for discharge  Improvement in symptoms so as ready for discharge   Short Term Goals: Ability to identify changes in lifestyle to reduce recurrence of condition will improve Ability to demonstrate self-control will improve Ability to identify triggers associated with substance abuse/mental health issues will improve Ability to identify and develop effective coping behaviors will improve  Medication Management: Evaluate patient's response, side effects, and tolerance of medication regimen.  Therapeutic Interventions: 1 to 1 sessions, Unit Group sessions and Medication administration.  Evaluation of Outcomes: Not Met  Physician Treatment Plan for Secondary Diagnosis:  Principal Problem:   Schizoaffective disorder, bipolar type ([place]) Active Problems:   Tobacco use disorder   Diabetes ([place])   HTN (hypertension)   Dyslipidemia  Long Term Goal(s): Improvement in symptoms so as ready for discharge Improvement in symptoms so as ready for discharge   Short Term Goals: Ability to identify changes in lifestyle to reduce recurrence of condition will improve Ability to demonstrate self-control will improve Ability to identify triggers associated with substance abuse/mental health issues will improve Ability to identify and develop effective coping behaviors will improve     Medication Management: Evaluate patient's response, side effects, and tolerance of medication regimen.  Therapeutic Interventions: 1 to 1 sessions, Unit Group sessions and Medication administration.  Evaluation of Outcomes: Not Met   RN Treatment Plan for Primary Diagnosis: Schizoaffective disorder, bipolar type ([place]) Long Term Goal(s): Knowledge of disease and therapeutic regimen to maintain health will improve  Short Term Goals: Ability to verbalize feelings will improve, Ability to disclose and discuss suicidal ideas, Ability to identify and develop effective coping behaviors will improve and Compliance with prescribed medications will improve  Medication Management: RN will administer medications as ordered by provider, will assess and evaluate patient's response and provide education to patient for prescribed medication. RN will report any adverse and/or side effects to prescribing provider.  Therapeutic Interventions: 1 on 1 counseling sessions, Psychoeducation, Medication administration, Evaluate responses to treatment, Monitor vital signs and CBGs as ordered, Perform/monitor CIWA, COWS, AIMS and Fall Risk screenings as ordered, Perform wound care treatments as ordered.  Evaluation of Outcomes: Not Met   LCSW Treatment Plan for Primary Diagnosis: Schizoaffective disorder, bipolar  type ([place]) Long Term Goal(s): Safe transition to appropriate next level of care at discharge, Engage patient in therapeutic group addressing interpersonal concerns.  Short Term Goals: Engage patient in aftercare planning with referrals and resources, Increase social support and Increase skills for wellness and recovery  Therapeutic Interventions: Assess for all discharge needs, 1 to 1 time with Social worker, Explore available resources and support systems, Assess for adequacy in community support network, Educate family and significant other(s) on suicide prevention, Complete Psychosocial Assessment, Interpersonal group therapy.  Evaluation of Outcomes: Not Met   Progress in Treatment: Attending groups: No. Participating in groups: No. Taking medication as prescribed: Yes. Toleration medication: Yes. Family/Significant other contact made: No, will contact:  guardian Patient understands diagnosis: Yes. Discussing patient identified problems/goals with staff: Yes. Medical problems stabilized or resolved: Yes. Denies suicidal/homicidal ideation: Yes. Issues/concerns per patient self-inventory: No. Other: none  New problem(s) identified: No, Describe:  none  New Short Term/Long Term Goal(s): Pt unable to attend, per team.  Discharge Plan or Barriers: CSW assessing for appropriate plan.  Reason for Continuation of Hospitalization: Depression Medication stabilization  Estimated Length of Stay:5-7 days.  Attendees: Patient: 10/08/2016  Physician: Dr. Jerilee Hoh, MD 10/08/2016   Nursing: Elige Radon, RN 10/08/2016   RN Care Manager: 10/08/2016   Social Worker: Lurline Idol,  LCSW 10/08/2016   Recreational Therapist: Everitt Amber, LRT/CTRS  10/08/2016   Other:  10/08/2016  Other:  10/08/2016   Other: 10/08/2016         Scribe for Treatment Team: Joanne Chars, LCSW 10/08/2016 1:10 PM

## 2016-10-08 NOTE — BHH Counselor (Signed)
Adult Comprehensive Assessment  Patient ID: John Copeland, male   DOB: 07-19-1990, 26 y.o.   MRN: [id number]  Information Source: Information source: Patient  Current Stressors:  Employment / Job issues: Pt reports he is upset that he was unable to interview for a job at [employer]. Housing / Lack of housing: Pt [place]  He has only been there a few days.  Living/Environment/Situation:  Living Arrangements: Group Home Living conditions (as described by patient or guardian): Only complaints are that [place] and the food is not good. How long has patient lived in current situation?: less than one week What is atmosphere in current home: Temporary  Family History:  Marital status: Single Are you sexually active?: No What is your sexual orientation?: heterosexual Has your sexual activity been affected by drugs, alcohol, medication, or emotional stress?: na Does patient have children?: No  Childhood History:  By whom was/is the patient raised?: Mother, Other (Comment) Additional childhood history information: Pt lived with mother and aunt up until age 32, then went to foster care.  Both mother and aunt died. Description of patient's relationship with caregiver when they were a child: Good relationship with mom. Never met his father.  Good relationship with Aunt. Patient's description of current relationship with people who raised him/her: Pt reports no contact with mother, father, aunt.   How were you disciplined when you got in trouble as a child/adolescent?: Pt reports physical discipline, but appropriate. Does patient have siblings?: Yes Number of Siblings: 4 Description of patient's current relationship with siblings: 2 brothers, 2 sisters.  Sisters are in [place], brother in [place] and one in [place].  No contact. Did patient suffer any verbal/emotional/physical/sexual abuse as a child?:  No Did patient suffer from severe childhood neglect?: No Has patient ever been sexually abused/assaulted/raped as an adolescent or adult?: No Was the patient ever a victim of a crime or a disaster?: No Witnessed domestic violence?: No Has patient been effected by domestic violence as an adult?: No  Education:  Highest grade of school patient has completed: 9th grade Currently a student?: No Learning disability?: No  Employment/Work Situation:   Employment situation: Unemployed What is the longest time patient has a held a job?: 1 month Where was the patient employed at that time?: [employer] Has patient ever been in the [employer]?: No Are There Guns or Other Weapons in [place]?: No  Financial Resources:   Financial resources: [employer], Medicaid Does patient have a Programmer, applications or guardian?: Yes Name of representative payee or guardian: Jerrye Beavers.  Unknown last name.  Alcohol/Substance Abuse:   What has been your use of drugs/alcohol within the last 12 months?: Pt denies any alcohol or drug use. If attempted suicide, did drugs/alcohol play a role in this?: No Alcohol/Substance Abuse Treatment Hx: Denies past history Has alcohol/substance abuse ever caused legal problems?: No  Social Support System:   Patient's Community Support System: None Type of faith/religion: Darrick Meigs How does patient's faith help to cope with current illness?: Don't know.  Leisure/Recreation:   Leisure and Hobbies: I don't know.  Strengths/Needs:   What things does the patient do well?: Pt unable to answer In what areas does patient struggle / problems for patient: I'm not in school and I'm not working.  Discharge Plan:   Does patient have access to transportation?: Yes (group home) Will patient be returning to same living situation  after discharge?: Yes (group home) Currently receiving community mental health services: No If no, would patient like referral for services when discharged?:  Yes (What county[place]) Does patient have financial barriers related to discharge medications?: No  Summary/Recommendations:   Summary and Recommendations (to be completed by the evaluator): Pt is 26 year old male from [place]. [facility]) Pt is diagnosed with schizoaffective disorder and was admitted due to suicidal ideation.  Recommendations for pt include crisis stabilization, therapeutic milieu, attend and participate in groups, medication management, and development of comprehensive mental wellness plan.  Upon discharge, pt will be returned to outpatient services.  Joanne Chars. 10/08/2016

## 2016-10-08 NOTE — ED Notes (Signed)

## 2016-10-08 NOTE — Progress Notes (Signed)
Pt lying in bed with cover over head. Will not engage with this nurse. Encouraged to attend breakfast, come to medication room for morning medications as ordered multiple times. Pt refuses. Even, unlabored respirations noted. Pt noted to move around in bed. Safety maintained with every 15 minute checks. Will continue to monitor.

## 2016-10-08 NOTE — Progress Notes (Signed)
Pt isolative to room most of the day. Noted to be sleeping wrapped up in blankets except during lunch and snack. Pt slept late this morning, unwilling to interact until he woke after 11am asking for breakfast. Pt ate snack and then lunch, took his medications and went back to his bed. Refused labs, vitals this morning. Appears flat, depressed. Support and encouragement provided. Medications administered as ordered with education. Safety maintained with every 15 minute checks. Will continue to monitor.

## 2016-10-08 NOTE — Progress Notes (Signed)
D: Pt depressed, reports he is not doing well tonight.  + S/I but agrees to safety on the unit.  Frequent meetings with pt for assessment and reassurance.  Pt given prn with + effect.  Does not get up out of bed and states he doesn't want to come out of his room.  It is loud on the unit at this time.  Pt was brought his HS meds because he wouldn't get up.  A: Encouraged pt to participate in unit activities.  Reassurance provided. Monitored on 15 minute safety checks and maintained safety.  R: In bed all evening, needed to be brought med's and fluids.  Agrees to safety at this time.

## 2016-10-08 NOTE — Tx Team (Signed)
Initial Treatment Plan 10/08/2016 2:31 AM Aleen SellsJoshua Jian QMV:[id number]    PATIENT STRESSORS: Educational concerns Occupational concerns   PATIENT STRENGTHS: Communication skills Physical Health   PATIENT IDENTIFIED PROBLEMS: "I need to another group home"  "I don't want to continue feeling this way"  Depression  Risk for suicide               DISCHARGE CRITERIA:  Ability to meet basic life and health needs Adequate post-discharge living arrangements Verbal commitment to aftercare and medication compliance  PRELIMINARY DISCHARGE PLAN: Outpatient therapy Placement in alternative living arrangements  PATIENT/FAMILY INVOLVEMENT: This treatment plan has been presented to and reviewed with the patient, Aleen SellsJoshua Sheils, and/or family member.  The patient and family have been given the opportunity to ask questions and make suggestions.  Eveline KetoAdediran T Jonika Critz, RN 10/08/2016, 2:31 AM

## 2016-10-08 NOTE — Plan of Care (Signed)
Problem: Activity: Goal: Interest or engagement in leisure activities will improve Outcome: Not Progressing Not participating, depressed

## 2016-10-08 NOTE — H&P (Addendum)
Psychiatric Admission Assessment Adult  Patient Identification: John SellsJoshua Copeland MRN:  [id number] Date of Evaluation:  10/08/2016 Chief Complaint:  Schizoaffective Disorder Principal Diagnosis: Schizoaffective disorder, bipolar type (HCC) Diagnosis:   Patient Active Problem List   Diagnosis Date Noted  . Tobacco use disorder [F17.200] 10/07/2016  . Diabetes (HCC) [E11.9] 10/07/2016  . HTN (hypertension) [I10] 10/07/2016  . Dyslipidemia [E78.5] 10/07/2016  . Schizoaffective disorder, bipolar type (HCC) [F25.0] 11/30/2015   History of Present Illness:  She is a 26 year old male with schizoaffective disorder who presented voluntarily to the emergency department on May 26 voicing suicidal ideation. Patient stated that he will hang himself because he doesn't like his group home. Also his upset because he has been trying to apply to a job at [employer] but says he cannot get this job because the group home is located too far from [employer].  Per chart review the patient was just discharged from our emergency department to a group home (5/25).  [facility], [address] [place], [place].  Patient also has a guardian named [name] for number [phone]  Per care everywhere looks like he was hospitalized in [place] last month but we don't have access to the notes.  Per nursing staff he is withdrawn to her room and has refused all medications. He refused to uncover his face and talk to me today. He did not respond any of my questions.  All the information use to complete this assessment was obtained from the chart and conversation with staff.  Substance abuse: not known  Trauma: not known    Associated Signs/Symptoms: Depression Symptoms:  depressed mood, insomnia, hopelessness, recurrent thoughts of death, (Hypo) Manic Symptoms:  Distractibility, Impulsivity, Irritable Mood, Anxiety Symptoms:  Excessive Worry, Psychotic Symptoms:  denies PTSD  Symptoms: NA Total Time spent with patient: 1 hour  Past Psychiatric History: not known  Is the patient at risk to self? Yes.    Has the patient been a risk to self in the past 6 months? No.  Has the patient been a risk to self within the distant past? No.  Is the patient a risk to others? No.  Has the patient been a risk to others in the past 6 months? No.  Has the patient been a risk to others within the distant past? No.    Alcohol Screening: 1. How often do you have a drink containing alcohol?: Never 2. How many drinks containing alcohol do you have on a typical day when you are drinking?: 1 or 2 3. How often do you have six or more drinks on one occasion?: Never Preliminary Score: 0 4. How often during the last year have you found that you were not able to stop drinking once you had started?: Never 5. How often during the last year have you failed to do what was normally expected from you becasue of drinking?: Never 6. How often during the last year have you needed a first drink in the morning to get yourself going after a heavy drinking session?: Never 7. How often during the last year have you had a feeling of guilt of remorse after drinking?: Never 8. How often during the last year have you been unable to remember what happened the night before because you had been drinking?: Never 9. Have you or someone else been injured as a result of your drinking?: No 10. Has a relative or friend or a doctor or another health worker been concerned about your drinking or  suggested you cut down?: No Alcohol Use Disorder Identification Test Final Score (AUDIT): 0 Brief Intervention: AUDIT score less than 7 or less-screening does not suggest unhealthy drinking-brief intervention not indicated  Past Medical History:  Past Medical History:  Diagnosis Date  . Depression   . Diabetes mellitus without complication (HCC)    History reviewed. No pertinent surgical history.  Family History: History  reviewed. No pertinent family history.  Family Psychiatric  History: Unknown  Tobacco Screening: Have you used any form of tobacco in the last 30 days? (Cigarettes, Smokeless Tobacco, Cigars, and/or Pipes): Yes Tobacco use, Select all that apply: 4 or less cigarettes per day Are you interested in Tobacco Cessation Medications?: No, patient refused Counseled patient on smoking cessation including recognizing danger situations, developing coping skills and basic information about quitting provided: Refused/Declined practical counseling   Social History: Unknown History  Alcohol Use No     History  Drug Use No    Additional Social History:      Pain Medications: denies Prescriptions: See PTA Over the Counter: See PTA History of alcohol / drug use?: No history of alcohol / drug abuse Longest period of sobriety (when/how long): No past or current use of mind altering substances    Allergies:  No Known Allergies   Lab Results:  Results for orders placed or performed during the hospital encounter of 10/06/16 (from the past 48 hour(s))  Glucose, capillary     Status: Abnormal   Collection Time: 10/06/16  6:56 PM  Result Value Ref Range   Glucose-Capillary 113 (H) 65 - 99 mg/dL    Blood Alcohol level:  Lab Results  Component Value Date   ETH <5 10/02/2016   ETH <5 11/29/2015    Metabolic Disorder Labs:  No results found for: HGBA1C, MPG No results found for: PROLACTIN No results found for: CHOL, TRIG, HDL, CHOLHDL, VLDL, LDLCALC  Current Medications: Current Facility-Administered Medications  Medication Dose Route Frequency Provider Last Rate Last Dose  . acetaminophen (TYLENOL) tablet 650 mg  650 mg Oral Q6H PRN Pucilowska, Jolanta B, MD      . alum & mag hydroxide-simeth (MAALOX/MYLANTA) 200-200-20 MG/5ML suspension 30 mL  30 mL Oral Q4H PRN Pucilowska, Jolanta B, MD      . atorvastatin (LIPITOR) tablet 10 mg  10 mg Oral q1800 Pucilowska, Jolanta B, MD      . divalproex  (DEPAKOTE ER) 24 hr tablet 1,500 mg  1,500 mg Oral QHS Pucilowska, Jolanta B, MD      . haloperidol (HALDOL) tablet 5 mg  5 mg Oral BID Pucilowska, Jolanta B, MD      . lisinopril (PRINIVIL,ZESTRIL) tablet 10 mg  10 mg Oral Daily Pucilowska, Jolanta B, MD      . magnesium hydroxide (MILK OF MAGNESIA) suspension 30 mL  30 mL Oral Daily PRN Pucilowska, Jolanta B, MD      . metFORMIN (GLUCOPHAGE) tablet 1,000 mg  1,000 mg Oral BID WC Pucilowska, Jolanta B, MD      . nicotine (NICODERM CQ - dosed in mg/24 hours) patch 21 mg  21 mg Transdermal Q0600 Pucilowska, Jolanta B, MD      . traZODone (DESYREL) tablet 100 mg  100 mg Oral QHS Pucilowska, Jolanta B, MD       PTA Medications: Prescriptions Prior to Admission  Medication Sig Dispense Refill Last Dose  . atorvastatin (LIPITOR) 10 MG tablet Take 1 tablet (10 mg total) by mouth daily at 6 PM. 7 tablet 0   .  divalproex (DEPAKOTE ER) 500 MG 24 hr tablet Take 3 tablets (1,500 mg total) by mouth at bedtime. 21 tablet 0   . haloperidol (HALDOL) 5 MG tablet Take 1 tablet (5 mg total) by mouth 2 (two) times daily. 14 tablet 0   . lisinopril (PRINIVIL,ZESTRIL) 10 MG tablet Take 1 tablet (10 mg total) by mouth daily. 7 tablet 0   . metFORMIN (GLUCOPHAGE) 1000 MG tablet Take 1 tablet (1,000 mg total) by mouth 2 (two) times daily with a meal. 14 tablet 0   . Multiple Vitamin (THEREMS PO) Take 1 tablet by mouth daily.   10/02/2016 at 0800  . Vitamin D, Ergocalciferol, (DRISDOL) 50000 units CAPS capsule Take 50,000 Units by mouth every 7 (seven) days. Sunday   09/30/2016 at 0800    Musculoskeletal: Strength & Muscle Tone: within normal limits Gait & Station: normal Patient leans: N/A  Psychiatric Specialty Exam: Physical Exam  Constitutional: He is oriented to person, place, and time. He appears well-developed and well-nourished.  HENT:  Head: Normocephalic and atraumatic.  Eyes: Conjunctivae and EOM are normal.  Neck: Normal range of motion.   Respiratory: Effort normal.  Musculoskeletal: Normal range of motion.  Neurological: He is alert and oriented to person, place, and time.    Review of Systems  Unable to perform ROS: Acuity of condition    Blood pressure 140/78, pulse 66, temperature 98 F (36.7 C), temperature source Oral, resp. rate 18, height 6' (1.829 m), weight 92.5 kg (204 lb), SpO2 100 %.Body mass index is 27.67 kg/m.  General Appearance: Bizarre  Eye Contact:  None  Speech:  NA  Volume:  n/a  Mood:  NA  Affect:  NA  Thought Process:  NA  Orientation:  NA  Thought Content:  NA  Suicidal Thoughts:  N/A  Homicidal Thoughts:  N/A  Memory:  NA  Judgement:  Impaired  Insight:  Lacking  Psychomotor Activity:  Decreased  Concentration:  Concentration: NA and Attention Span: NA  Recall:  NA  Fund of Knowledge:  NA  Language:  NA  Akathisia:  No  Handed:    AIMS (if indicated):     Assets:  Health and safety inspector Housing Physical Health  ADL's:  Intact  Cognition: N/A  Sleep:  Number of Hours: 345    Treatment Plan Summary:  Patient is a 26 year old male with history of schizoaffective disorder. The patient has a guardian. He presents to the emergency department 1 day after being discharged to a group home from our ER.  Says his back because he dislikes his group home and is suicidal. Patient refuses to cooperate with assessment and staff. He has refused medications.  Schizoaffective disorder continue haloperidol 5 mg twice a day and Depakote 1500 mg by mouth daily at bedtime  Agitation: I would place orders for Zyprexa and Ativan IM or by mouth as needed for agitation.  Insomnia continue trazodone 100 mg by mouth daily at bedtime  Hypertension continue lisinopril 10 mg a day  Diabetes continue metformin 1000 mg twice a day  Dyslipidemia continue Lipitor 10 mg a day  Tobacco use disorder continue nicotine patch 21 mg a day  Diet low sodium and carbohydrate modified  Vital signs  daily  Hospitalization and status continue involuntary commitment  Follow-up  Disposition back to group home  Labs patient is hemoglobin A1c, lipid panel and TSH but he has been refusing blood work  We will try to obtain collateral information from guardian    Physician Treatment Plan  for Primary Diagnosis: Schizoaffective disorder, bipolar type (HCC) Long Term Goal(s): Improvement in symptoms so as ready for discharge  Short Term Goals: Ability to identify changes in lifestyle to reduce recurrence of condition will improve, Ability to demonstrate self-control will improve and Ability to identify triggers associated with substance abuse/mental health issues will improve  Physician Treatment Plan for Secondary Diagnosis: Principal Problem:   Schizoaffective disorder, bipolar type (HCC) Active Problems:   Tobacco use disorder   Diabetes (HCC)   HTN (hypertension)   Dyslipidemia  Long Term Goal(s): Improvement in symptoms so as ready for discharge  Short Term Goals: Ability to identify and develop effective coping behaviors will improve  I certify that inpatient services furnished can reasonably be expected to improve the patient's condition.    Jimmy Footman, MD 5/28/20188:00 AM

## 2016-10-08 NOTE — [facility] Suicide Risk Assessment (Signed)
[facility]   Nursing information obtained from:  Patient Demographic factors:  Male, Adolescent or young adult, Low socioeconomic status, Unemployed Current Mental Status:  Suicidal ideation indicated by patient, Self-harm thoughts Loss Factors:  NA Historical Factors:  NA Risk Reduction Factors:  Living with another person, especially a relative  Total Time spent with patient: Principal Problem: <principal problem not specified> Diagnosis:   Patient Active Problem List   Diagnosis Date Noted  . Tobacco use disorder [F17.200] 10/07/2016  . Diabetes (HCC) [E11.9] 10/07/2016  . HTN (hypertension) [I10] 10/07/2016  . Dyslipidemia [E78.5] 10/07/2016  . Schizoaffective disorder, bipolar type (HCC) [F25.0] 11/30/2015   Subjective Data:   Continued Clinical Symptoms:  Alcohol Use Disorder Identification Test Final Score (AUDIT): 0 The "Alcohol Use Disorders Identification Test", Guidelines for Use in Primary Care, Second Edition.  World Science writerHealth Organization [facility](WHO). Score between 0-7:  no or low risk or alcohol related problems. Score between 8-15:  moderate risk of alcohol related problems. Score between 16-19:  high risk of alcohol related problems. Score 20 or above:  warrants further diagnostic evaluation for alcohol dependence and treatment.   CLINICAL FACTORS:   Severe Anxiety and/or Agitation Currently Psychotic Previous Psychiatric Diagnoses and Treatments    Psychiatric Specialty Exam: Physical Exam  ROS  Blood pressure 140/78, pulse 66, temperature 98 F (36.7 C), temperature source Oral, resp. rate 18, height 6' (1.829 m), weight 92.5 kg (204 lb), SpO2 100 %.Body mass index is 27.67 kg/m.                                                    Sleep:  Number of Hours: 345      COGNITIVE FEATURES THAT CONTRIBUTE TO RISK:  Closed-mindedness    SUICIDE RISK:   Moderate:  Frequent suicidal ideation with limited  intensity, and duration, some specificity in terms of plans, no associated intent, good self-control, limited dysphoria/symptomatology, some risk factors present, and identifiable protective factors, including available and accessible social support.  [facility]  I certify that inpatient services furnished can reasonably be expected to improve the patient's condition.   Jimmy FootmanHernandez-Gonzalez,  John Rolfe, MD 10/08/2016, 7:59 AM

## 2016-10-08 NOTE — [facility] Suicide Risk Assessment (Signed)
[facility]   Nursing information obtained from:  Patient Demographic factors:  Male, Adolescent or young adult, Low socioeconomic status, Unemployed Current Mental Status:  Suicidal ideation indicated by patient, Self-harm thoughts Loss Factors:  NA Historical Factors:  NA Risk Reduction Factors:  Living with another person, especially a relative  Total Time spent with patient: 1 hour Principal Problem: Schizoaffective disorder, bipolar type (HCC) Diagnosis:   Patient Active Problem List   Diagnosis Date Noted  . Tobacco use disorder [F17.200] 10/07/2016  . Diabetes (HCC) [E11.9] 10/07/2016  . HTN (hypertension) [I10] 10/07/2016  . Dyslipidemia [E78.5] 10/07/2016  . Schizoaffective disorder, bipolar type (HCC) [F25.0] 11/30/2015   Subjective Data:   Continued Clinical Symptoms:  Alcohol Use Disorder Identification Test Final Score (AUDIT): 0 The "Alcohol Use Disorders Identification Test", Guidelines for Use in Primary Care, Second Edition.  World Science writerHealth Organization [facility](WHO). Score between 0-7:  no or low risk or alcohol related problems. Score between 8-15:  moderate risk of alcohol related problems. Score between 16-19:  high risk of alcohol related problems. Score 20 or above:  warrants further diagnostic evaluation for alcohol dependence and treatment.   CLINICAL FACTORS:   Severe Anxiety and/or Agitation Currently Psychotic   Musculoskeletal:   Psychiatric Specialty Exam: Physical Exam  ROS  Blood pressure 140/78, pulse 66, temperature 98 F (36.7 C), temperature source Oral, resp. rate 18, height 6' (1.829 m), weight 92.5 kg (204 lb), SpO2 100 %.Body mass index is 27.67 kg/m.                                                    Sleep:  Number of Hours: 345      COGNITIVE FEATURES THAT CONTRIBUTE TO RISK:  Closed-mindedness    SUICIDE RISK:   Moderate:  Frequent suicidal ideation with limited intensity, and  duration, some specificity in terms of plans, no associated intent, good self-control, limited dysphoria/symptomatology, some risk factors present, and identifiable protective factors, including available and accessible social support.  [facility]  I certify that inpatient services furnished can reasonably be expected to improve the patient's condition.   Jimmy FootmanHernandez-Gonzalez,  Jaylie Neaves, MD 10/08/2016, 10:38 AM

## 2016-10-08 NOTE — Progress Notes (Signed)
Admission Notes  Pt is a 26y/o male. Pt arrived flat and sleepy; reluctantly answering admission question. Pt at the time of admission endorsed suicidal ideation; state, "I had thought of hanging myself and still thinking about it; I feel a little better now that I am no more in that group home."-Pt contracts for safety. Pt at this time denied anxiety, pain, HI or AVH. Pt also denied any illegal drugs or alcohol use. All Pt questions and concerns addressed. Support, encouragement, and safe environment provided.  15-minute safety checks continue. Pt was searched and no contraband was found.

## 2016-10-08 NOTE — Plan of Care (Signed)
Problem: Medication: Goal: Compliance with prescribed medication regimen will improve Outcome: Progressing Pt refused to get out of bed and take medications this morning, but later in the morning, pt did take his medications as prescribed. Medications administered as ordered with education.

## 2016-10-09 MED ORDER — HALOPERIDOL 5 MG PO TABS
5.0000 mg | ORAL_TABLET | Freq: Three times a day (TID) | ORAL | Status: DC
  Administered 2016-10-09 – 2016-10-15 (×18): 5 mg via ORAL
  Filled 2016-10-09 (×18): qty 1

## 2016-10-09 MED ORDER — DIPHENHYDRAMINE HCL 25 MG PO CAPS
50.0000 mg | ORAL_CAPSULE | Freq: Every day | ORAL | Status: DC
  Administered 2016-10-09 – 2016-10-14 (×6): 50 mg via ORAL
  Filled 2016-10-09 (×7): qty 2

## 2016-10-09 MED ORDER — PALIPERIDONE ER 3 MG PO TB24
9.0000 mg | ORAL_TABLET | Freq: Every day | ORAL | Status: DC
  Filled 2016-10-09: qty 3

## 2016-10-09 MED ORDER — TRAZODONE HCL 100 MG PO TABS: 100.0000 mg | ORAL_TABLET | Freq: Every day | ORAL | 0 refills | Status: DC

## 2016-10-09 MED ORDER — DIVALPROEX SODIUM ER 500 MG PO TB24: 1500.0000 mg | ORAL_TABLET | Freq: Every day | ORAL | 0 refills | Status: DC

## 2016-10-09 MED ORDER — HALOPERIDOL 5 MG PO TABS: 5.0000 mg | ORAL_TABLET | Freq: Two times a day (BID) | ORAL | 0 refills | Status: DC

## 2016-10-09 NOTE — B[facility] Group Notes (Signed)
[facility] Group Notes:  (Nursing/MHT/Case Management/Adjunct)  Date:  10/09/2016  Time:  4:57 AM  Type of Therapy:  Group Therapy  Participation Level:  Did Not Attend   Janaria Mccammon Imani Kody Brandl 10/09/2016, 4:57 AM 

## 2016-10-09 NOTE — Progress Notes (Signed)
D:Patient  Voice he did not want to change medication . Voice of wanting to change group homes . Patient slept all shift no partiicipation with unit programing up only for meals .  Limited insight into problems  Appetite good voice no concerns around sleep. Denies suicidal ideations.  Voice of being sad and Depressed . Stated [place] Stated there was  No one to talk to . Patient voice of wanting to be  In school . Instructed  Patient the 1st thing he need to do was get out of bed   And attend unit programing . D: Patient stated slept good last night .Stated appetite is good and energy level  Is normal. Stated concentration is good . Stated on Depression scale 9 , hopeless 9 and anxiety9 .( low 0-10 high) Denies suicidal  homicidal ideations  .  No auditory hallucinations  No pain concerns . Appropriate ADL'S.No Interacting with peers and staff.  A: Encourage patient participation with unit programming . Instruction  Given on  Medication , verbalize understanding. R: Voice no other concerns. Staff continue to monitor

## 2016-10-09 NOTE — Progress Notes (Signed)
Pt appeared to sleep 8 hours while monitored on 15 minute safety checks.

## 2016-10-09 NOTE — Progress Notes (Addendum)
CSW spoke to [employer] [place], [name], legal guardian.  Pt has been in the hospital several times recently and [name] is not asking for immediate discharge.  [name] had spoken to [place] before the weekend about pt being discharged back to group home and then found out today that [facility].  He would like for pt to get the CCA done for ACT team services. Garner NashGregory Tyia Binford, MSW, LCSW Clinical Social Worker 10/09/2016 12:03 PM   [facility].  [phone].  CSW received a "voice mail has not been set up" messaage.  CSW called Reather LittlerMarty Prunty back and he will try to reach group home as well.  CSW also requested a copy of guardianship paperwork from [place] and he will fax. Garner NashGregory Zarriah Starkel, MSW, LCSW Clinical Social Worker 10/09/2016 12:55 PM

## 2016-10-09 NOTE — [facility] Suicide Risk Assessment (Signed)
[facility] INPATIENT:  Family/Significant Other Suicide Prevention Education  Suicide Prevention Education:  Education Completed; [place], [place], legal guardian, [phone], has been identified by the patient as the familReather Littlery member/significant other with whom the patient will be residing, and identified as the person(s) who will aid the patient in the event of a mental health crisis (suicidal ideations/suicide attempt).  With written consent from the patient, the family member/significant other has been provided the following suicide prevention education, prior to the and/or following the discharge of the patient.  The suicide prevention education provided includes the following:  Suicide risk factors  Suicide prevention and interventions  National Suicide Hotline telephone number  [facility] assessment telephone number  [facility] Emergency Assistance 911  [facility] and/or Residential Mobile Crisis Unit telephone number  Request made of family/significant other to:  Remove weapons (e.g., guns, rifles, knives), all items previously/currently identified as safety concern.    Remove drugs/medications (over-the-counter, prescriptions, illicit drugs), all items previously/currently identified as a safety concern.  The family member/significant other verbalizes understanding of the suicide prevention education information provided.  The family member/significant other agrees to remove the items of safety concern listed above.  Mr. Kerin Ransomrunty reports pt has had multiple hospitalizations over the past few months and they are concerned and trying to arrange for ACT team services.   Lorri FrederickWierda, Melodie Ashworth Jon, LCSW 10/09/2016, 12:55 PM

## 2016-10-09 NOTE — Progress Notes (Signed)
[facility] MD Progress Note  10/09/2016 10:08 AM John Copeland  MRN:  [id number] Subjective:  he is a 26 year old male with schizoaffective disorder who presented voluntarily to the emergency department on May 26 voicing suicidal ideation. Patient stated that he will hang himself because he doesn't like his group home. Also his upset because he has been trying to apply to a job at [employer] but says he cannot get this job because the group home is located too far from [employer].  Per chart review the patient was just discharged from our emergency department to a group home (5/25).  [facility], [address] [place], [place].  Patient also has a guardian named Sharl Ma for number [phone]  Per care everywhere looks like he was hospitalized in [place] last month but we don't have access to the notes.  Per nursing staff he is withdrawn to her room and has refused all medications. He refused to uncover his face and talk to me today. He did not respond any of my questions.  All the information use to complete this assessment was obtained from the chart and conversation with staff.  5/29 once again not getting out of bed, not interacting with peers or staff. He was found lying in bed covered with blankets. He will not uncover his face to talk to me. He will not answer any questions. He has started taking his medications.   Per nursing: D: Pt depressed, reports he is not doing well tonight.  + S/I but agrees to safety on the unit.  Frequent meetings with pt for assessment and reassurance.  Pt given prn with + effect.  Does not get up out of bed and states he doesn't want to come out of his room.  It is loud on the unit at this time.  Pt was brought his HS meds because he wouldn't get up.  A: Encouraged pt to participate in unit activities.  Reassurance provided. Monitored on 15 minute safety checks and maintained safety.  R: In bed all evening, needed to be brought med's and  fluids.  Agrees to safety at this time.  Principal Problem: Schizoaffective disorder, bipolar type (HCC) Diagnosis:   Patient Active Problem List   Diagnosis Date Noted  . Tobacco use disorder [F17.200] 10/07/2016  . Diabetes (HCC) [E11.9] 10/07/2016  . HTN (hypertension) [I10] 10/07/2016  . Dyslipidemia [E78.5] 10/07/2016  . Schizoaffective disorder, bipolar type (HCC) [F25.0] 11/30/2015   Total Time spent with patient: 30 minutes  Past Psychiatric History: unknown  Past Medical History:  Past Medical History:  Diagnosis Date  . Depression   . Diabetes mellitus without complication (HCC)    History reviewed. No pertinent surgical history.  Family History: History reviewed. No pertinent family history.  Family Psychiatric  History: unknown  Social History:  History  Alcohol Use No     History  Drug Use No    Social History   Social History  . Marital status: Single    Spouse name: N/A  . Number of children: N/A  . Years of education: N/A   Social History Main Topics  . Smoking status: Current Every Day Smoker    Packs/day: 0.50    Types: Cigarettes  . Smokeless tobacco: Never Used  . Alcohol use No  . Drug use: No  . Sexual activity: Not Asked   Other Topics Concern  . None   Social History Narrative  . None   Additional Social History:    Pain  Medications: denies Prescriptions: See PTA Over the Counter: See PTA History of alcohol / drug use?: No history of alcohol / drug abuse Longest period of sobriety (when/how long): No past or current use of mind altering substances     Current Medications: Current Facility-Administered Medications  Medication Dose Route Frequency Provider Last Rate Last Dose  . acetaminophen (TYLENOL) tablet 650 mg  650 mg Oral Q6H PRN Pucilowska, Jolanta B, MD      . alum & mag hydroxide-simeth (MAALOX/MYLANTA) 200-200-20 MG/5ML suspension 30 mL  30 mL Oral Q4H PRN Pucilowska, Jolanta B, MD      . atorvastatin (LIPITOR)  tablet 10 mg  10 mg Oral q1800 Pucilowska, Jolanta B, MD   10 mg at 10/08/16 1621  . divalproex (DEPAKOTE ER) 24 hr tablet 1,500 mg  1,500 mg Oral QHS Pucilowska, Jolanta B, MD   1,500 mg at 10/08/16 2226  . haloperidol (HALDOL) tablet 5 mg  5 mg Oral BID Pucilowska, Jolanta B, MD   5 mg at 10/09/16 0919  . lisinopril (PRINIVIL,ZESTRIL) tablet 10 mg  10 mg Oral Daily Pucilowska, Jolanta B, MD   10 mg at 10/09/16 0919  . LORazepam (ATIVAN) tablet 2 mg  2 mg Oral TID PRN Jimmy Footman, MD   2 mg at 10/08/16 2016  . magnesium hydroxide (MILK OF MAGNESIA) suspension 30 mL  30 mL Oral Daily PRN Pucilowska, Jolanta B, MD      . metFORMIN (GLUCOPHAGE) tablet 1,000 mg  1,000 mg Oral BID WC Pucilowska, Jolanta B, MD   1,000 mg at 10/09/16 0919  . nicotine (NICODERM CQ - dosed in mg/24 hours) patch 21 mg  21 mg Transdermal Q0600 Pucilowska, Jolanta B, MD      . OLANZapine (ZYPREXA) injection 10 mg  10 mg Intramuscular BID PRN Jimmy Footman, MD       Or  . OLANZapine zydis (ZYPREXA) disintegrating tablet 10 mg  10 mg Oral BID PRN Jimmy Footman, MD      . traZODone (DESYREL) tablet 100 mg  100 mg Oral QHS Pucilowska, Jolanta B, MD   100 mg at 10/08/16 2226    Lab Results: No results found for this or any previous visit (from the past 48 hour(s)).  Blood Alcohol level:  Lab Results  Component Value Date   ETH <5 10/02/2016   ETH <5 11/29/2015    Metabolic Disorder Labs: No results found for: HGBA1C, MPG No results found for: PROLACTIN No results found for: CHOL, TRIG, HDL, CHOLHDL, VLDL, LDLCALC  Physical Findings: AIMS: Facial and Oral Movements Muscles of Facial Expression: None, normal Lips and Perioral Area: None, normal Jaw: None, normal Tongue: None, normal,Extremity Movements Upper (arms, wrists, hands, fingers): None, normal Lower (legs, knees, ankles, toes): None, normal, Trunk Movements Neck, shoulders, hips: None, normal, Overall  Severity Severity of abnormal movements (highest score from questions above): None, normal Incapacitation due to abnormal movements: None, normal Patient's awareness of abnormal movements (rate only patient's report): No Awareness, Dental Status Current problems with teeth and/or dentures?: No Does patient usually wear dentures?: No  CIWA:    COWS:     Musculoskeletal: Strength & Muscle Tone: within normal limits Gait & Station: normal Patient leans: N/A  Psychiatric Specialty Exam: Physical Exam  Constitutional: He is oriented to person, place, and time. He appears well-developed and well-nourished.  HENT:  Head: Normocephalic and atraumatic.  Eyes: EOM are normal.  Respiratory: Effort normal.  Musculoskeletal: Normal range of motion.  Neurological: He is alert  and oriented to person, place, and time.    Review of Systems  Constitutional: Negative.   HENT: Negative.   Eyes: Negative.   Respiratory: Negative.   Cardiovascular: Negative.   Gastrointestinal: Negative.   Genitourinary: Negative.   Musculoskeletal: Negative.   Skin: Negative.   Neurological: Negative.   Endo/Heme/Allergies: Negative.     Blood pressure 123/73, pulse 66, temperature 98 F (36.7 C), temperature source Oral, resp. rate 18, height 6' (1.829 m), weight 92.5 kg (204 lb), SpO2 100 %.Body mass index is 27.67 kg/m.  General Appearance: Disheveled  Eye Contact:  Minimal  Speech:  Clear and Coherent  Volume:  Decreased  Mood:  Irritable  Affect:  Constricted  Thought Process:  Linear and Descriptions of Associations: Intact  Orientation:  Full (Time, Place, and Person)  Thought Content:  Hallucinations: n/a  Suicidal Thoughts:  n/a  Homicidal Thoughts:  n/a  Memory:  NA  Judgement:  NA  Insight:  NA  Psychomotor Activity:  NA  Concentration:  Concentration: Poor and Attention Span: Poor  Recall:  Poor  Fund of Knowledge:  Poor  Language:  Poor  Akathisia:  No  Handed:    AIMS (if  indicated):     Assets:  Physical Health  ADL's:  Intact  Cognition:  WNL  Sleep:  Number of Hours: 8     Treatment Plan Summary: Patient is a 26 year old male with history of schizoaffective disorder. The patient has a guardian. He presents to the emergency department 1 day after being discharged to a group home from our ER.  Says his back because he dislikes his group home and is suicidal. Patient refuses to cooperate with assessment and staff. He has refused medications.  Schizoaffective disorder continue haloperidol 5 mg But will increase to 3 times a day and Depakote 1500 mg by mouth daily at bedtime  Agitation: I would place orders for Zyprexa and Ativan IM or by mouth as needed for agitation.  Insomnia continue trazodone 100 mg by mouth daily at bedtime  Hypertension continue lisinopril 10 mg a day  Diabetes continue metformin 1000 mg twice a day  Dyslipidemia continue Lipitor 10 mg a day  Tobacco use disorder continue nicotine patch 21 mg a day  Diet low sodium and carbohydrate modified  Vital signs daily  Hospitalization and status continue involuntary commitment  Follow-up  Disposition back to group home--- apparently he has received a 30 day notice of eviction  Labs patient is hemoglobin A1c, lipid panel and TSH but he has been refusing blood work  We will try to obtain collateral information from guardian--guardian is trying to set up act services. Cardia says that he has been hospitalized back-to-back lately. Guardian feels patient has not been a stable.    Jimmy FootmanHernandez-Gonzalez,  Atalaya Zappia, MD 10/09/2016, 10:08 AM

## 2016-10-09 NOTE — Progress Notes (Signed)
CSW spoke to MedtronicJoann Shaver, McClearyardinal care coordinator.  [phone]  [facility] [place]  Joann had made a referral to [facility] for ACT team services and pt was supposed to have CCA completed in the next few days.  CSW will contact [facility] to see [facility]. Garner NashGregory Chong January, MSW, LCSW Clinical Social Worker 10/09/2016 11:22 AM

## 2016-10-09 NOTE — [facility] Group Notes (Signed)
[facility] LCSW Group Therapy Note  Date/Time: 10/09/16, 1500  Type of Therapy/Topic:  Group Therapy:  Feelings about Diagnosis  Participation Level:  Did Not Attend   Mood:   Description of Group:    This group will allow patients to explore their thoughts and feelings about diagnoses they have received. Patients will be guided to explore their level of understanding and acceptance of these diagnoses. Facilitator will encourage patients to process their thoughts and feelings about the reactions of others to their diagnosis, and will guide patients in identifying ways to discuss their diagnosis with significant others in their lives. This group will be process-oriented, with patients participating in exploration of their own experiences as well as giving and receiving support and challenge from other group members.   Therapeutic Goals: 1. Patient will demonstrate understanding of diagnosis as evidence by identifying two or more symptoms of the disorder:  2. Patient will be able to express two feelings regarding the diagnosis 3. Patient will demonstrate ability to communicate their needs through discussion and/or role plays  Summary of Patient Progress:        Therapeutic Modalities:   Cognitive Behavioral Therapy Brief Therapy Feelings Identification   Greg Auda Finfrock, LCSW 

## 2016-10-09 NOTE — Progress Notes (Signed)
Recreation Therapy Notes  At approximately 10:30 am, LRT attempted assessment. Patient did not respond when LRT called his name. Patient was breathing with blanker covering his whole body.  Jacquelynn CreeGreene,John Copeland, LRT/CTRS 10/09/2016 1:04 PM

## 2016-10-09 NOTE — Progress Notes (Signed)
Recreation Therapy Notes  Date: 05.29.18 Time: 9:30 am Location: Craft Room  Group Topic: Self-expression  Goal Area(s) Addresses:  Patient will identify one color per emotion listed on wheel. Patient will verbalize benefit of using art as a means of self-expression. Patient will verbalize one emotion experienced during session. Patient will be educated on other forms of self-expression.  Behavioral Response: Did not attend  Intervention: Emotion Wheel  Activity: Patients were given an Emotion Wheel worksheet and were instructed to pick a color for each emotion listed on the wheel.  Education: LRT educated patients on other forms of self-expression.   Education Outcome: Patient did not attend group.  Clinical Observations/Feedback: Patient did not attend group.  Ladene Allocca M, LRT/CTRS 10/09/2016 10:01 AM 

## 2016-10-09 NOTE — Progress Notes (Signed)
D: Pt passive SI-contracts for safety denies HI/AVH. Pt is pleasant and cooperative. Pt  Stated he felt the same as when coming in. Pt has kept to himself even when in dayroom with peers.   A: Pt was offered support and encouragement. Pt was given scheduled medications. Pt was encourage to attend groups. Q 15 minute checks were done for safety.   R:Pt attends groups and interacts well with peers and staff. Pt is taking medication. Pt has no complaints at this time .Pt receptive to treatment and safety maintained on unit.

## 2016-10-09 NOTE — Plan of Care (Signed)
Problem: Coping: Goal: Ability to demonstrate self-control will improve Outcome: Progressing Pt has not acted out on the unit this evening  Problem: Safety: Goal: Periods of time without injury will increase Outcome: Progressing Pt safe on the unit at this time

## 2016-10-09 NOTE — [facility] Suicide Risk Assessment (Signed)
[facility]   Principal Problem: Schizoaffective disorder, bipolar type [facility](HCC) Discharge Diagnoses:  Patient Active Problem List   Diagnosis Date Noted  . Tobacco use disorder [F17.200] 10/07/2016  . Diabetes (HCC) [E11.9] 10/07/2016  . HTN (hypertension) [I10] 10/07/2016  . Dyslipidemia [E78.5] 10/07/2016  . Schizoaffective disorder, bipolar type (HCC) [F25.0] 11/30/2015    Psychiatric Specialty Exam: ROS  Blood pressure 123/73, pulse 66, temperature 98 F (36.7 C), temperature source Oral, resp. rate 18, height 6' (1.829 m), weight 92.5 kg (204 lb), SpO2 100 %.Body mass index is 27.67 kg/m.                                                       Mental Status Per Nursing Assessment::   On Admission:  Suicidal ideation indicated by patient, Self-harm thoughts  Demographic Factors:  Male  Loss Factors: dislikes current living situation  Historical Factors: Impulsivity  Risk Reduction Factors:   Living with another person, especially a relative and Positive social support  No access to guns  Continued Clinical Symptoms:  Schizophrenia:   Less than 26 years old Previous Psychiatric Diagnoses and Treatments  Cognitive Features That Contribute To Risk:  Closed-mindedness and Loss of executive function    Suicide Risk:  Minimal: No identifiable suicidal ideation.  Patients presenting with no risk factors but with morbid ruminations; may be classified as minimal risk based on the severity of the depressive symptoms     Jimmy FootmanHernandez-Gonzalez,  Jeniyah Menor, MD 10/09/2016, 11:32 AM

## 2016-10-10 NOTE — Progress Notes (Signed)
TC with Del[facility], [facility].  [facility] to complete CCA for ACT team services. Garner NashGregory Charliene Inoue, MSW, LCSW Clinical Social Worker 10/10/2016 4:14 PM

## 2016-10-10 NOTE — [facility] Group Notes (Signed)
[facility] LCSW Group Therapy  10/10/2016 1:39 PM  Type of Therapy:  Group Therapy  Participation Level: Patient did not attend group. CSW invited patient to group.   Summary of Progress/Problems: Emotional Regulation: Patients will identify both negative and positive emotions. They will discuss emotions they have difficulty regulating and how they impact their lives. Patients will be asked to identify healthy coping skills to combat unhealthy reactions to negative emotions.   Lee-Anne Flicker G. Garnette CzechSampson MSW, LCSWA 10/10/2016, 1:39 PM

## 2016-10-10 NOTE — Tx Team (Signed)
Interdisciplinary Treatment and Diagnostic Plan Update  10/10/2016 Time of Session: 1320 John Copeland MRN: [id number]  Principal Diagnosis: Schizoaffective disorder, bipolar type [facility](HCC)  Secondary Diagnoses: Principal Problem:   Schizoaffective disorder, bipolar type (HCC) Active Problems:   Tobacco use disorder   Diabetes (HCC)   HTN (hypertension)   Dyslipidemia   Current Medications:  Current Facility-Administered Medications  Medication Dose Route Frequency Provider Last Rate Last Dose  . acetaminophen (TYLENOL) tablet 650 mg  650 mg Oral Q6H PRN Pucilowska, Jolanta B, MD      . alum & mag hydroxide-simeth (MAALOX/MYLANTA) 200-200-20 MG/5ML suspension 30 mL  30 mL Oral Q4H PRN Pucilowska, Jolanta B, MD      . atorvastatin (LIPITOR) tablet 10 mg  10 mg Oral q1800 Pucilowska, Jolanta B, MD   10 mg at 10/09/16 1700  . diphenhydrAMINE (BENADRYL) capsule 50 mg  50 mg Oral QHS Jimmy FootmanHernandez-Gonzalez, Andrea, MD   50 mg at 10/09/16 2221  . divalproex (DEPAKOTE ER) 24 hr tablet 1,500 mg  1,500 mg Oral QHS Pucilowska, Jolanta B, MD   1,500 mg at 10/09/16 2221  . haloperidol (HALDOL) tablet 5 mg  5 mg Oral TID Jimmy FootmanHernandez-Gonzalez, Andrea, MD   5 mg at 10/10/16 1236  . lisinopril (PRINIVIL,ZESTRIL) tablet 10 mg  10 mg Oral Daily Pucilowska, Jolanta B, MD   10 mg at 10/10/16 0834  . LORazepam (ATIVAN) tablet 2 mg  2 mg Oral TID PRN Jimmy FootmanHernandez-Gonzalez, Andrea, MD   2 mg at 10/08/16 2016  . magnesium hydroxide (MILK OF MAGNESIA) suspension 30 mL  30 mL Oral Daily PRN Pucilowska, Jolanta B, MD      . metFORMIN (GLUCOPHAGE) tablet 1,000 mg  1,000 mg Oral BID WC Pucilowska, Jolanta B, MD   1,000 mg at 10/10/16 0835  . nicotine (NICODERM CQ - dosed in mg/24 hours) patch 21 mg  21 mg Transdermal Q0600 Pucilowska, Jolanta B, MD      . traZODone (DESYREL) tablet 100 mg  100 mg Oral QHS Pucilowska, Jolanta B, MD   100 mg at 10/09/16 2221   PTA Medications: Prescriptions Prior to Admission  Medication Sig  Dispense Refill Last Dose  . atorvastatin (LIPITOR) 10 MG tablet Take 1 tablet (10 mg total) by mouth daily at 6 PM. 7 tablet 0   . divalproex (DEPAKOTE ER) 500 MG 24 hr tablet Take 3 tablets (1,500 mg total) by mouth at bedtime. 21 tablet 0   . haloperidol (HALDOL) 5 MG tablet Take 1 tablet (5 mg total) by mouth 2 (two) times daily. 14 tablet 0   . lisinopril (PRINIVIL,ZESTRIL) 10 MG tablet Take 1 tablet (10 mg total) by mouth daily. 7 tablet 0   . metFORMIN (GLUCOPHAGE) 1000 MG tablet Take 1 tablet (1,000 mg total) by mouth 2 (two) times daily with a meal. 14 tablet 0   . Multiple Vitamin (THEREMS PO) Take 1 tablet by mouth daily.   10/02/2016 at 0800  . Vitamin D, Ergocalciferol, (DRISDOL) 50000 units CAPS capsule Take 50,000 Units by mouth every 7 (seven) days. Sunday   09/30/2016 at 0800    Patient Stressors: Educational concerns Occupational concerns  Patient Strengths: Manufacturing systems engineerCommunication skills Physical Health  Treatment Modalities: Medication Management, Group therapy, Case management,  1 to 1 session with clinician, Psychoeducation, Recreational therapy.   Physician Treatment Plan for Primary Diagnosis: Schizoaffective disorder, bipolar type (HCC) Long Term Goal(s): Improvement in symptoms so as ready for discharge Improvement in symptoms so as ready for discharge   Short Term  Goals: Ability to identify changes in lifestyle to reduce recurrence of condition will improve Ability to demonstrate self-control will improve Ability to identify triggers associated with substance abuse/mental health issues will improve Ability to identify and develop effective coping behaviors will improve  Medication Management: Evaluate patient's response, side effects, and tolerance of medication regimen.  Therapeutic Interventions: 1 to 1 sessions, Unit Group sessions and Medication administration.  Evaluation of Outcomes: Progressing  Physician Treatment Plan for Secondary Diagnosis: Principal  Problem:   Schizoaffective disorder, bipolar type (HCC) Active Problems:   Tobacco use disorder   Diabetes (HCC)   HTN (hypertension)   Dyslipidemia  Long Term Goal(s): Improvement in symptoms so as ready for discharge Improvement in symptoms so as ready for discharge   Short Term Goals: Ability to identify changes in lifestyle to reduce recurrence of condition will improve Ability to demonstrate self-control will improve Ability to identify triggers associated with substance abuse/mental health issues will improve Ability to identify and develop effective coping behaviors will improve     Medication Management: Evaluate patient's response, side effects, and tolerance of medication regimen.  Therapeutic Interventions: 1 to 1 sessions, Unit Group sessions and Medication administration.  Evaluation of Outcomes: Progressing   RN Treatment Plan for Primary Diagnosis: Schizoaffective disorder, bipolar type (HCC) Long Term Goal(s): Knowledge of disease and therapeutic regimen to maintain health will improve  Short Term Goals: Ability to verbalize feelings will improve, Ability to disclose and discuss suicidal ideas, Ability to identify and develop effective coping behaviors will improve and Compliance with prescribed medications will improve  Medication Management: RN will administer medications as ordered by provider, will assess and evaluate patient's response and provide education to patient for prescribed medication. RN will report any adverse and/or side effects to prescribing provider.  Therapeutic Interventions: 1 on 1 counseling sessions, Psychoeducation, Medication administration, Evaluate responses to treatment, Monitor vital signs and CBGs as ordered, Perform/monitor CIWA, COWS, AIMS and Fall Risk screenings as ordered, Perform wound care treatments as ordered.  Evaluation of Outcomes: Progressing   LCSW Treatment Plan for Primary Diagnosis: Schizoaffective disorder, bipolar  type (HCC) Long Term Goal(s): Safe transition to appropriate next level of care at discharge, Engage patient in therapeutic group addressing interpersonal concerns.  Short Term Goals: Engage patient in aftercare planning with referrals and resources, Increase social support and Increase skills for wellness and recovery  Therapeutic Interventions: Assess for all discharge needs, 1 to 1 time with Social worker, Explore available resources and support systems, Assess for adequacy in community support network, Educate family and significant other(s) on suicide prevention, Complete Psychosocial Assessment, Interpersonal group therapy.  Evaluation of Outcomes: Progressing   Progress in Treatment: Attending groups: No. Participating in groups: No. Taking medication as prescribed: Yes. Toleration medication: Yes. Family/Significant other contact made: Yes, individual(s) contacted:  guardian, Durwin Nora Patient understands diagnosis: Yes. Discussing patient identified problems/goals with staff: Yes. Medical problems stabilized or resolved: Yes. Denies suicidal/homicidal ideation: Yes. Issues/concerns per patient self-inventory: No. Other: none  New problem(s) identified: No, Describe:  none  New Short Term/Long Term Goal(s): Pt goal: "I want a different place to live in a different town."  Discharge Plan or Barriers: Pt has been referred for ACT team services through [facility].  Reason for Continuation of Hospitalization: Depression Medication stabilization  Estimated Length of Stay:3-5 days.  Attendees: Patient: John Copeland 10/10/2016   Physician: Dr. Ardyth Harps, MD 10/10/2016   Nursing:  10/10/2016   RN Care Manager: 10/10/2016   Social Worker: Daleen Squibb, LCSW  10/10/2016   Recreational Therapist:  10/10/2016   Other:  10/10/2016   Other:  10/10/2016   Other: 10/10/2016            Scribe for Treatment Team: Lorri Frederick, LCSW 10/10/2016 4:14 PM

## 2016-10-10 NOTE — Progress Notes (Signed)
[facility] MD Progress Note  10/10/2016 8:53 AM John Copeland  MRN:  [id number] Subjective:  he is a 26 year old male with schizoaffective disorder who presented voluntarily to the emergency department on May 26 voicing suicidal ideation. Patient stated that he will hang himself because he doesn't like his group home. Also his upset because he has been trying to apply to a job at [employer] but says he cannot get this job because the group home is located too far from [employer].  Per chart review the patient was just discharged from our emergency department to a group home (5/25).  [facility], [address] [place], [place].  Patient also has a guardian named Sharl Ma for number [phone]  Per care everywhere looks like he was hospitalized in [place] last month but we don't have access to the notes.  Per nursing staff he is withdrawn to her room and has refused all medications. He refused to uncover his face and talk to me today. He did not respond any of my questions.  All the information use to complete this assessment was obtained from the chart and conversation with staff.  5/29 once again not getting out of bed, not interacting with peers or staff. He was found lying in bed covered with blankets. He will not uncover his face to talk to me. He will not answer any questions. He has started taking his medications.  5/30 patient has been compliant with all medications. Yesterday I tried to start him on Invega but patient says that he is only willing to take Haldol. The Haldol was increased from twice a day to 3 times a day and he complied. He is slept 7 hours. He has been in 100% of his meals. He states he is from all day, he does not participate in groups and has minimal interactions with staff or peers.  Today he talked to me for the first time. He told me that he's upset and hopeless because he cannot find a job and he doesn't have Social Security car.   Per nursing: D:  Pt passive SI-contracts for safety denies HI/AVH. Pt is pleasant and cooperative. Pt  Stated he felt the same as when coming in. Pt has kept to himself even when in dayroom with peers.   A: Pt was offered support and encouragement. Pt was given scheduled medications. Pt was encourage to attend groups. Q 15 minute checks were done for safety.   R:Pt attends groups and interacts well with peers and staff. Pt is taking medication. Pt has no complaints at this time .Pt receptive to treatment and safety maintained on unit.  Principal Problem: Schizoaffective disorder, bipolar type (HCC) Diagnosis:   Patient Active Problem List   Diagnosis Date Noted  . Tobacco use disorder [F17.200] 10/07/2016  . Diabetes (HCC) [E11.9] 10/07/2016  . HTN (hypertension) [I10] 10/07/2016  . Dyslipidemia [E78.5] 10/07/2016  . Schizoaffective disorder, bipolar type (HCC) [F25.0] 11/30/2015   Total Time spent with patient: 30 minutes  Past Psychiatric History: unknown  Past Medical History:  Past Medical History:  Diagnosis Date  . Depression   . Diabetes mellitus without complication (HCC)    History reviewed. No pertinent surgical history.  Family History: History reviewed. No pertinent family history.  Family Psychiatric  History: unknown  Social History:  History  Alcohol Use No     History  Drug Use No    Social History   Social History  . Marital status: Single  Spouse name: N/A  . Number of children: N/A  . Years of education: N/A   Social History Main Topics  . Smoking status: Current Every Day Smoker    Packs/day: 0.50    Types: Cigarettes  . Smokeless tobacco: Never Used  . Alcohol use No  . Drug use: No  . Sexual activity: Not Asked   Other Topics Concern  . None   Social History Narrative  . None   Additional Social History:    Pain Medications: denies Prescriptions: See PTA Over the Counter: See PTA History of alcohol / drug use?: No history of alcohol / drug  abuse Longest period of sobriety (when/how long): No past or current use of mind altering substances     Current Medications: Current Facility-Administered Medications  Medication Dose Route Frequency Provider Last Rate Last Dose  . acetaminophen (TYLENOL) tablet 650 mg  650 mg Oral Q6H PRN Pucilowska, Jolanta B, MD      . alum & mag hydroxide-simeth (MAALOX/MYLANTA) 200-200-20 MG/5ML suspension 30 mL  30 mL Oral Q4H PRN Pucilowska, Jolanta B, MD      . atorvastatin (LIPITOR) tablet 10 mg  10 mg Oral q1800 Pucilowska, Jolanta B, MD   10 mg at 10/09/16 1700  . diphenhydrAMINE (BENADRYL) capsule 50 mg  50 mg Oral QHS Jimmy Footman, MD   50 mg at 10/09/16 2221  . divalproex (DEPAKOTE ER) 24 hr tablet 1,500 mg  1,500 mg Oral QHS Pucilowska, Jolanta B, MD   1,500 mg at 10/09/16 2221  . haloperidol (HALDOL) tablet 5 mg  5 mg Oral TID Jimmy Footman, MD   5 mg at 10/10/16 0835  . lisinopril (PRINIVIL,ZESTRIL) tablet 10 mg  10 mg Oral Daily Pucilowska, Jolanta B, MD   10 mg at 10/10/16 0834  . LORazepam (ATIVAN) tablet 2 mg  2 mg Oral TID PRN Jimmy Footman, MD   2 mg at 10/08/16 2016  . magnesium hydroxide (MILK OF MAGNESIA) suspension 30 mL  30 mL Oral Daily PRN Pucilowska, Jolanta B, MD      . metFORMIN (GLUCOPHAGE) tablet 1,000 mg  1,000 mg Oral BID WC Pucilowska, Jolanta B, MD   1,000 mg at 10/10/16 0835  . nicotine (NICODERM CQ - dosed in mg/24 hours) patch 21 mg  21 mg Transdermal Q0600 Pucilowska, Jolanta B, MD      . traZODone (DESYREL) tablet 100 mg  100 mg Oral QHS Pucilowska, Jolanta B, MD   100 mg at 10/09/16 2221    Lab Results: No results found for this or any previous visit (from the past 48 hour(s)).  Blood Alcohol level:  Lab Results  Component Value Date   ETH <5 10/02/2016   ETH <5 11/29/2015    Metabolic Disorder Labs: No results found for: HGBA1C, MPG No results found for: PROLACTIN No results found for: CHOL, TRIG, HDL, CHOLHDL,  VLDL, LDLCALC  Physical Findings: AIMS: Facial and Oral Movements Muscles of Facial Expression: None, normal Lips and Perioral Area: None, normal Jaw: None, normal Tongue: None, normal,Extremity Movements Upper (arms, wrists, hands, fingers): None, normal Lower (legs, knees, ankles, toes): None, normal, Trunk Movements Neck, shoulders, hips: None, normal, Overall Severity Severity of abnormal movements (highest score from questions above): None, normal Incapacitation due to abnormal movements: None, normal Patient's awareness of abnormal movements (rate only patient's report): No Awareness, Dental Status Current problems with teeth and/or dentures?: No Does patient usually wear dentures?: No  CIWA:    COWS:     Musculoskeletal:  Strength & Muscle Tone: within normal limits Gait & Station: normal Patient leans: N/A  Psychiatric Specialty Exam: Physical Exam  Constitutional: He is oriented to person, place, and time. He appears well-developed and well-nourished.  HENT:  Head: Normocephalic and atraumatic.  Eyes: EOM are normal.  Respiratory: Effort normal.  Musculoskeletal: Normal range of motion.  Neurological: He is alert and oriented to person, place, and time.    Review of Systems  Constitutional: Negative.   HENT: Negative.   Eyes: Negative.   Respiratory: Negative.   Cardiovascular: Negative.   Gastrointestinal: Negative.   Genitourinary: Negative.   Musculoskeletal: Negative.   Skin: Negative.   Neurological: Negative.   Endo/Heme/Allergies: Negative.   Psychiatric/Behavioral: Positive for depression.    Blood pressure 123/73, pulse 66, temperature 98 F (36.7 C), temperature source Oral, resp. rate 18, height 6' (1.829 m), weight 92.5 kg (204 lb), SpO2 100 %.Body mass index is 27.67 kg/m.  General Appearance: Disheveled  Eye Contact:  Minimal  Speech:  Clear and Coherent  Volume:  Decreased  Mood:  Irritable  Affect:  Constricted  Thought Process:  Linear  and Descriptions of Associations: Intact  Orientation:  Full (Time, Place, and Person)  Thought Content:  Hallucinations: n/a  Suicidal Thoughts:  n/a  Homicidal Thoughts:  n/a  Memory:  NA  Judgement:  NA  Insight:  NA  Psychomotor Activity:  NA  Concentration:  Concentration: Poor and Attention Span: Poor  Recall:  Poor  Fund of Knowledge:  Poor  Language:  Poor  Akathisia:  No  Handed:    AIMS (if indicated):     Assets:  Physical Health  ADL's:  Intact  Cognition:  WNL  Sleep:  Number of Hours: 7     Treatment Plan Summary:  Minimal improvement since admission  Patient is a 26 year old male with history of schizoaffective disorder. The patient has a guardian. He presents to the emergency department 1 day after being discharged to a group home from our ER.  Says his back because he dislikes his group home and is suicidal. Patient refuses to cooperate with assessment and staff. He has refused medications.  Schizoaffective disorder continue haloperidol 5 mg three times a day and Depakote 1500 mg by mouth daily at bedtime  Insomnia continue trazodone 100 mg by mouth daily at bedtime  Hypertension continue lisinopril 10 mg a day  Diabetes continue metformin 1000 mg twice a day  Dyslipidemia continue Lipitor 10 mg a day  Tobacco use disorder continue nicotine patch 21 mg a day  Diet low sodium and carbohydrate modified  Vital signs daily  Hospitalization and status continue involuntary commitment  Follow-up: Patient will be seen by [employer] acting on Friday and we will try to connect him with services. --- Patient was very happy to hear that [employer] can assist him in finding a job and connecting him with vocational rehabilitation  Disposition back to group home  Labs patient is hemoglobin A1c, lipid panel and TSH but he has been refusing blood work   Jimmy FootmanHernandez-Gonzalez,   [place], MD 10/10/2016, 8:53 AM

## 2016-10-10 NOTE — [facility] Group Notes (Signed)
[facility] Group Notes:  (Nursing/MHT/Case Management/Adjunct)  Date:  10/10/2016  Time:  12:17 AM  Type of Therapy:  Group Therapy  Participation Level:  Did Not Attend  Participation Qual Summary of Progress/Problems:  Mayra NeerJackie L Vinay Ertl 10/10/2016, 12:17 AM

## 2016-10-10 NOTE — Plan of Care (Signed)
Problem: Safety: Goal: Periods of time without injury will increase Outcome: Progressing Patient aware of environment

## 2016-10-10 NOTE — Progress Notes (Signed)
D: Patient stated slept good last night .Stated appetite is good and energy level  Is normal. Stated concentration is good . Stated on Depression scale 0 , hopeless 0 and anxiety 0 .( low 0-10 high) Denies suicidal  homicidal ideations  .  No auditory hallucinations  No pain concerns . Appropriate ADL'S. Interacting with peers and staff.  Voice he was ready to go back to the group home . Stated things  Will be different   Encourage patient participation with unit programming . Instruction  Given on  Medication , verbalize understanding. R: Voice no other concerns. Staff continue to monitor

## 2016-10-11 NOTE — [facility] Group Notes (Signed)
[facility] Group Notes:  (Nursing/MHT/Case Management/Adjunct)  Date:  10/11/2016  Time:  3:00 AM  Type of Therapy:  Psychoeducational Skills  Participation Level:  Did Not Attend  Daisuke Bailey R Nikodem Leadbetter 10/11/2016, 3:00 AM

## 2016-10-11 NOTE — Plan of Care (Signed)
Problem: Activity: Goal: Sleeping patterns will improve Outcome: Progressing Patient slept for Estimated Hours of 6.45; every 15 minutes safety round maintained, no injury or falls during this shift.    

## 2016-10-11 NOTE — [facility] Group Notes (Signed)
[facility] LCSW Group Therapy Note  Type of Therapy and Topic:  Group Therapy:  Goals Group: SMART Goals  Participation Level:  Patient did not attend group. CSW invited patient to group.   Description of Group:   The purpose of a daily goals group is to assist and guide patients in setting recovery/wellness-related goals.  The objective is to set goals as they relate to the crisis in which they were admitted. Patients will be using SMART goal modalities to set measurable goals.  Characteristics of realistic goals will be discussed and patients will be assisted in setting and processing how one will reach their goal. Facilitator will also assist patients in applying interventions and coping skills learned in psycho-education groups to the SMART goal and process how one will achieve defined goal.  Therapeutic Goals: -Patients will develop and document one goal related to or their crisis in which brought them into treatment. -Patients will be guided by LCSW using SMART goal setting modality in how to set a measurable, attainable, realistic and time sensitive goal.  -Patients will process barriers in reaching goal. -Patients will process interventions in how to overcome and successful in reaching goal.   Summary of Patient Progress:  Patient Goal: Patient did not attend group. CSW invited patient to group.    Therapeutic Modalities:   Motivational Interviewing Engineer, manufacturing systemsCognitive Behavioral Therapy Crisis Intervention Model SMART goals setting  Natalie Leclaire G. Garnette CzechSampson MSW, LCSWA 10/11/2016 10:18 AM

## 2016-10-11 NOTE — Progress Notes (Signed)
Patient remained  In bed through-out shift  .UP for meals . Cooperative  With medication given. Isolative  To room . Voice of wanting to go back to group   Home  . Patient voice concern around his ID cards . And a Bank . Patient questioned  [place] Voice  he was ok now . Appetite good. Voice no concerns around sleep   slept good last night , energy level  Is normal. Stated concentration is good . Stated no Depression Denies suicidal  homicidal ideations .  No auditory hallucinations  No pain concerns No ADL'S. Limited  Interacting with peers and staff.  A: Encourage patient participation with unit programming . Instruction  Given on  Medication , verbalize understanding. R: Voice no other concerns. Staff continue to monitor  .

## 2016-10-11 NOTE — [facility] Group Notes (Signed)
[facility] LCSW Group Therapy  10/11/2016 2:26 PM  Type of Therapy:  Group Therapy  Participation Level:  Patient did not attend group. CSW invited patient to group.   Summary of Progress/Problems: Balance in life: Patients will discuss the concept of balance and how it looks and feels to be unbalanced. Pt will identify areas in their life that is unbalanced and ways to become more balanced. They discussed what aspects in their lives has influenced their self care. Patients also discussed self care in the areas of self regulation/control, hygiene/appearance, sleep/relaxation, healthy leisure, healthy eating habits, exercise, inner peace/spirituality, self improvement, sobriety, and health management. They were challenged to identify changes that are needed in order to improve self care.  Kearston Putman G. Garnette CzechSampson MSW, LCSWA  10/11/2016, 2:26 PM

## 2016-10-11 NOTE — Progress Notes (Signed)
[facility] MD Progress Note  10/11/2016 12:22 PM Davaun Quintela  MRN:  [id number] Subjective:  he is a 26 year old male with schizoaffective disorder who presented voluntarily to the emergency department on May 26 voicing suicidal ideation. Patient stated that he will hang himself because he doesn't like his group home. Also his upset because he has been trying to apply to a job at [employer] but says he cannot get this job because the group home is located too far from [employer].  Per chart review the patient was just discharged from our emergency department to a group home (5/25).  [facility], [address] [place], [place].  Patient also has a guardian named Sharl Ma for number [phone]  Per care everywhere looks like he was hospitalized in [place] last month but we don't have access to the notes.  Per nursing staff he is withdrawn to her room and has refused all medications. He refused to uncover his face and talk to me today. He did not respond any of my questions.  All the information use to complete this assessment was obtained from the chart and conversation with staff.  5/29 once again not getting out of bed, not interacting with peers or staff. He was found lying in bed covered with blankets. He will not uncover his face to talk to me. He will not answer any questions. He has started taking his medications.  5/30 patient has been compliant with all medications. Yesterday I tried to start him on Invega but patient says that he is only willing to take Haldol. The Haldol was increased from twice a day to 3 times a day and he complied. He is slept 7 hours. He has been in 100% of his meals. He states he is from all day, he does not participate in groups and has minimal interactions with staff or peers.  Today he talked to me for the first time. He told me that he's upset and hopeless because he cannot find a job and he doesn't have Social Security car.  5/31 Patient was  lying in bed, a little more cooperative and interactive today. He declined haldol injection that was offered.   Says he has no side effects from his mediations. Denies suicidality, homicidality or hallucinations.  Per nursing: D: Patient stated slept good last night .Stated appetite is good and energy level  Is normal. Stated concentration is good . Stated on Depression scale 0 , hopeless 0 and anxiety 0 .( low 0-10 high) Denies suicidal  homicidal ideations  .  No auditory hallucinations  No pain concerns . Appropriate ADL'S. Interacting with peers and staff.  Voice he was ready to go back to the group home . Stated things  Will be different   Encourage patient participation with unit programming . Instruction  Given on  Medication , verbalize understanding. R: Voice no other concerns. Staff continue to monitor   Principal Problem: Schizoaffective disorder, bipolar type (HCC) Diagnosis:   Patient Active Problem List   Diagnosis Date Noted  . Tobacco use disorder [F17.200] 10/07/2016  . Diabetes (HCC) [E11.9] 10/07/2016  . HTN (hypertension) [I10] 10/07/2016  . Dyslipidemia [E78.5] 10/07/2016  . Schizoaffective disorder, bipolar type (HCC) [F25.0] 11/30/2015   Total Time spent with patient: 30 minutes  Past Psychiatric History: unknown  Past Medical History:  Past Medical History:  Diagnosis Date  . Depression   . Diabetes mellitus without complication (HCC)    History reviewed. No pertinent surgical history.  Family  History: History reviewed. No pertinent family history.  Family Psychiatric  History: unknown  Social History:  History  Alcohol Use No     History  Drug Use No    Social History   Social History  . Marital status: Single    Spouse name: N/A  . Number of children: N/A  . Years of education: N/A   Social History Main Topics  . Smoking status: Current Every Day Smoker    Packs/day: 0.50    Types: Cigarettes  . Smokeless tobacco: Never Used  . Alcohol  use No  . Drug use: No  . Sexual activity: Not Asked   Other Topics Concern  . None   Social History Narrative  . None   Additional Social History:    Pain Medications: denies Prescriptions: See PTA Over the Counter: See PTA History of alcohol / drug use?: No history of alcohol / drug abuse Longest period of sobriety (when/how long): No past or current use of mind altering substances     Current Medications: Current Facility-Administered Medications  Medication Dose Route Frequency Provider Last Rate Last Dose  . acetaminophen (TYLENOL) tablet 650 mg  650 mg Oral Q6H PRN Pucilowska, Jolanta B, MD      . alum & mag hydroxide-simeth (MAALOX/MYLANTA) 200-200-20 MG/5ML suspension 30 mL  30 mL Oral Q4H PRN Pucilowska, Jolanta B, MD      . atorvastatin (LIPITOR) tablet 10 mg  10 mg Oral q1800 Pucilowska, Jolanta B, MD   10 mg at 10/10/16 1723  . diphenhydrAMINE (BENADRYL) capsule 50 mg  50 mg Oral QHS Jimmy FootmanHernandez-Gonzalez, Bama Hanselman, MD   50 mg at 10/10/16 2136  . divalproex (DEPAKOTE ER) 24 hr tablet 1,500 mg  1,500 mg Oral QHS Pucilowska, Jolanta B, MD   1,500 mg at 10/10/16 2136  . haloperidol (HALDOL) tablet 5 mg  5 mg Oral TID Jimmy FootmanHernandez-Gonzalez, Avien Taha, MD   5 mg at 10/11/16 1211  . lisinopril (PRINIVIL,ZESTRIL) tablet 10 mg  10 mg Oral Daily Pucilowska, Jolanta B, MD   10 mg at 10/11/16 0802  . LORazepam (ATIVAN) tablet 2 mg  2 mg Oral TID PRN Jimmy FootmanHernandez-Gonzalez, Joahan Swatzell, MD   2 mg at 10/08/16 2016  . magnesium hydroxide (MILK OF MAGNESIA) suspension 30 mL  30 mL Oral Daily PRN Pucilowska, Jolanta B, MD      . metFORMIN (GLUCOPHAGE) tablet 1,000 mg  1,000 mg Oral BID WC Pucilowska, Jolanta B, MD   1,000 mg at 10/11/16 0802  . nicotine (NICODERM CQ - dosed in mg/24 hours) patch 21 mg  21 mg Transdermal Q0600 Pucilowska, Jolanta B, MD      . traZODone (DESYREL) tablet 100 mg  100 mg Oral QHS Pucilowska, Jolanta B, MD   100 mg at 10/10/16 2136    Lab Results: No results found for this or  any previous visit (from the past 48 hour(s)).  Blood Alcohol level:  Lab Results  Component Value Date   ETH <5 10/02/2016   ETH <5 11/29/2015    Metabolic Disorder Labs: No results found for: HGBA1C, MPG No results found for: PROLACTIN No results found for: CHOL, TRIG, HDL, CHOLHDL, VLDL, LDLCALC  Physical Findings: AIMS: Facial and Oral Movements Muscles of Facial Expression: None, normal Lips and Perioral Area: None, normal Jaw: None, normal Tongue: None, normal,Extremity Movements Upper (arms, wrists, hands, fingers): None, normal Lower (legs, knees, ankles, toes): None, normal, Trunk Movements Neck, shoulders, hips: None, normal, Overall Severity Severity of abnormal movements (highest score from  questions above): None, normal Incapacitation due to abnormal movements: None, normal Patient's awareness of abnormal movements (rate only patient's report): No Awareness, Dental Status Current problems with teeth and/or dentures?: No Does patient usually wear dentures?: No  CIWA:    COWS:     Musculoskeletal: Strength & Muscle Tone: within normal limits Gait & Station: normal Patient leans: N/A  Psychiatric Specialty Exam: Physical Exam  Constitutional: He is oriented to person, place, and time. He appears well-developed and well-nourished.  HENT:  Head: Normocephalic and atraumatic.  Eyes: EOM are normal.  Respiratory: Effort normal.  Musculoskeletal: Normal range of motion.  Neurological: He is alert and oriented to person, place, and time.    Review of Systems  Constitutional: Negative.   HENT: Negative.   Eyes: Negative.   Respiratory: Negative.   Cardiovascular: Negative.   Gastrointestinal: Negative.   Genitourinary: Negative.   Musculoskeletal: Negative.   Skin: Negative.   Neurological: Negative.   Endo/Heme/Allergies: Negative.   Psychiatric/Behavioral: Positive for depression.    Blood pressure 123/73, pulse 66, temperature 98 F (36.7 C),  temperature source Oral, resp. rate 18, height 6' (1.829 m), weight 92.5 kg (204 lb), SpO2 100 %.Body mass index is 27.67 kg/m.  General Appearance: Disheveled  Eye Contact:  Minimal  Speech:  Clear and Coherent  Volume:  Decreased  Mood:  Irritable  Affect:  Constricted  Thought Process:  Linear and Descriptions of Associations: Intact  Orientation:  Full (Time, Place, and Person)  Thought Content:  Hallucinations: n/a  Suicidal Thoughts:  n/a  Homicidal Thoughts:  n/a  Memory:  NA  Judgement:  NA  Insight:  NA  Psychomotor Activity:  NA  Concentration:  Concentration: Poor and Attention Span: Poor  Recall:  Poor  Fund of Knowledge:  Poor  Language:  Poor  Akathisia:  No  Handed:    AIMS (if indicated):     Assets:  Physical Health  ADL's:  Intact  Cognition:  WNL  Sleep:  Number of Hours: 6.45     Treatment Plan Summary:  Minimal improvement since admission  Patient is a 26 year old male with history of schizoaffective disorder. The patient has a guardian. He presents to the emergency department 1 day after being discharged to a group home from our ER.  Says his back because he dislikes his group home and is suicidal.   Schizoaffective disorder continue haloperidol 5 mg three times a day and Depakote 1500 mg by mouth daily at bedtime. Patient refused haldol injection decanoate that was offered. Check Depakote level this weekend.  Insomnia continue trazodone 100 mg by mouth daily at bedtime  Hypertension continue lisinopril 10 mg a day  Diabetes continue metformin 1000 mg twice a day  Dyslipidemia continue Lipitor 10 mg a day  Tobacco use disorder continue nicotine patch 21 mg a day  Diet low sodium and carbohydrate modified  Vital signs daily  Hospitalization and status continue involuntary commitment  Follow-up: Patient will be seen by [facility] acting on Friday and we will try to connect him with services. --- Patient was very happy to hear that [employer] can  assist him in finding a job and connecting him with vocational rehabilitation  Disposition back to group home  Labs patient is hemoglobin A1c, lipid panel and TSH but he has been refusing blood work   Jimmy Footman, MD 10/11/2016, 12:22 PM

## 2016-10-11 NOTE — Progress Notes (Signed)
Patient ID: John SellsJoshua Copeland, male   DOB: 1991-03-09, 26 y.o.   MRN: [id number] Active, randomly out of his room, interaction with peers are still limited, out for snacks and medications, excited, "I will be discharged on Monday ..." Denied SI/HI/AVH

## 2016-10-12 MED ORDER — HALOPERIDOL 5 MG PO TABS: 5.0000 mg | ORAL_TABLET | Freq: Three times a day (TID) | ORAL | 0 refills | Status: DC

## 2016-10-12 MED ORDER — DIPHENHYDRAMINE HCL 50 MG PO CAPS: 50.0000 mg | ORAL_CAPSULE | Freq: Every day | ORAL | 0 refills | Status: DC

## 2016-10-12 NOTE — Plan of Care (Signed)
Problem: Activity: Goal: Interest or engagement in leisure activities will improve Outcome: Not Progressing Patient appears to be withdrawn to room and will not acknowledge staff presence for a long period of time. Patient has not attended any morning groups yet.  Goal: Imbalance in normal sleep/wake cycle will improve Outcome: Progressing Pt appears to have no issues with his sleeping pattern. Will continue to monitor patient and notify MD of any changes.

## 2016-10-12 NOTE — Progress Notes (Signed)
D: Pt denies SI/HI/AVH. Pt has kept to himself even when in dayroom with peers. Pt isolates to room a lot . Pt complained of headache annd was given prn medication.  A: Pt was offered support and encouragement. Pt was given scheduled medications. Pt was encourage to attend groups. Q 15 minute checks were done for safety.   R:Pt attends groups and interacts well with peers and staff. Pt is taking medication.Pt receptive to treatment and safety maintained on unit.

## 2016-10-12 NOTE — [place] FL2 (Signed)
[place] MEDICAID FL2 LEVEL OF CARE SCREENING TOOL     IDENTIFICATION  Patient Name: John SellsJoshua Raisanen Birthdate: 1991-02-11 Sex: male Admission Date (Current Location): 10/08/2016  [place]   Facility and Address:  [facility], [address], [place], [place] [address]      Provider Number: [id number]  Attending Physician Name and Address:  Van ClinesHernandez-Gonzalez, Andr*  Relative Name and Phone Number:       Current Level of Care: Hospital Recommended Level of Care: Family Care Home Prior Approval Number:    Date Approved/Denied:   PASRR Number:    Discharge Plan:  (group home)    Current Diagnoses: Patient Active Problem List   Diagnosis Date Noted  . Tobacco use disorder 10/07/2016  . Diabetes (HCC) 10/07/2016  . HTN (hypertension) 10/07/2016  . Dyslipidemia 10/07/2016  . Schizoaffective disorder, bipolar type (HCC) 11/30/2015    Orientation RESPIRATION BLADDER Height & Weight     Self, Time, Situation, Place  Normal Continent Weight: 204 lb (92.5 kg) Height:  6' (182.9 cm)  BEHAVIORAL SYMPTOMS/MOOD NEUROLOGICAL BOWEL NUTRITION STATUS   (na)  (na) Continent  (na)  AMBULATORY STATUS COMMUNICATION OF NEEDS Skin   Independent Verbally Normal                       Personal Care Assistance Level of Assistance   (na)           Functional Limitations Info   (na)          SPECIAL CARE FACTORS FREQUENCY                       Contractures Contractures Info: Not present    Additional Factors Info   (na)               Current Medications (10/12/2016):  This is the current hospital active medication list Current Facility-Administered Medications  Medication Dose Route Frequency Provider Last Rate Last Dose  . acetaminophen (TYLENOL) tablet 650 mg  650 mg Oral Q6H PRN Pucilowska, Jolanta B, MD      . alum & mag hydroxide-simeth (MAALOX/MYLANTA) 200-200-20 MG/5ML suspension 30 mL  30 mL  Oral Q4H PRN Pucilowska, Jolanta B, MD      . atorvastatin (LIPITOR) tablet 10 mg  10 mg Oral q1800 Pucilowska, Jolanta B, MD   10 mg at 10/11/16 1719  . diphenhydrAMINE (BENADRYL) capsule 50 mg  50 mg Oral QHS Jimmy FootmanHernandez-Gonzalez, Andrea, MD   50 mg at 10/11/16 2105  . divalproex (DEPAKOTE ER) 24 hr tablet 1,500 mg  1,500 mg Oral QHS Pucilowska, Jolanta B, MD   1,500 mg at 10/11/16 2106  . haloperidol (HALDOL) tablet 5 mg  5 mg Oral TID Jimmy FootmanHernandez-Gonzalez, Andrea, MD   5 mg at 10/12/16 1201  . lisinopril (PRINIVIL,ZESTRIL) tablet 10 mg  10 mg Oral Daily Pucilowska, Jolanta B, MD   10 mg at 10/12/16 0900  . LORazepam (ATIVAN) tablet 2 mg  2 mg Oral TID PRN Jimmy FootmanHernandez-Gonzalez, Andrea, MD   2 mg at 10/08/16 2016  . magnesium hydroxide (MILK OF MAGNESIA) suspension 30 mL  30 mL Oral Daily PRN Pucilowska, Jolanta B, MD      . metFORMIN (GLUCOPHAGE) tablet 1,000 mg  1,000 mg Oral BID WC Pucilowska, Jolanta B, MD   1,000 mg at 10/12/16 0900  . nicotine (NICODERM CQ - dosed in mg/24 hours) patch 21 mg  21 mg Transdermal Q0600  Pucilowska, Jolanta B, MD      . traZODone (DESYREL) tablet 100 mg  100 mg Oral QHS Pucilowska, Jolanta B, MD   100 mg at 10/11/16 2106     Discharge Medications: Please see discharge summary for a list of discharge medications.  Relevant Imaging Results:  Relevant Lab Results:   Additional Information none  Lorri Frederick, LCSW

## 2016-10-12 NOTE — [facility] Suicide Risk Assessment (Addendum)
[facility]   Principal Problem: Schizoaffective disorder, bipolar type [facility](HCC) Discharge Diagnoses:  Patient Active Problem List   Diagnosis Date Noted  . Tobacco use disorder [F17.200] 10/07/2016  . Diabetes (HCC) [E11.9] 10/07/2016  . HTN (hypertension) [I10] 10/07/2016  . Dyslipidemia [E78.5] 10/07/2016  . Schizoaffective disorder, bipolar type (HCC) [F25.0] 11/30/2015      Psychiatric Specialty Exam: ROS  Blood pressure 108/67, pulse 71, temperature 98 F (36.7 C), temperature source Oral, resp. rate 18, height 6' (1.829 m), weight 92.5 kg (204 lb), SpO2 100 %.Body mass index is 27.67 kg/m.                                                       Mental Status Per Nursing Assessment::   On Admission:  Suicidal ideation indicated by patient, Self-harm thoughts  Demographic Factors:  Male  Loss Factors: NA  Historical Factors: Impulsivity  Risk Reduction Factors:   Living with another person, especially a relative and Positive social support  No access to guns  Continued Clinical Symptoms:  Schizophrenia:   Less than 172 years old Previous Psychiatric Diagnoses and Treatments  Cognitive Features That Contribute To Risk:  Loss of executive function    Suicide Risk:  Minimal: No identifiable suicidal ideation.  Patients presenting with no risk factors but with morbid ruminations; may be classified as minimal risk based on the severity of the depressive symptoms  Follow-up Information    [facility] Go on 10/11/2016.   Why:  [facility] Thursday, 10/11/16, at 12:00 noon. Contact information: [address], [address] [place], [place] [address] P: [phone] F: [phone]           Jimmy FootmanHernandez-Gonzalez,  Carrel Leather, MD 10/15/2016, 8:22 AM

## 2016-10-12 NOTE — [facility] Group Notes (Signed)
[facility] Group Notes:  (Nursing/MHT/Case Management/Adjunct)  Date:  10/12/2016  Time:  11:33 PM  Type of Therapy:  Group Therapy  Participation Level:  Active  Participation Quality:  Appropriate  Affect:  Appropriate  Cognitive:  Appropriate  Insight:  Appropriate  Engagement in Group:  Engaged  Modes of Intervention:  Discussion  Summary of Progress/Problems:  Burt EkJanice Marie Jeremih Dearmas 10/12/2016, 11:33 PM

## 2016-10-12 NOTE — Progress Notes (Signed)
Patient withdrawn to room most of shift. Patient in the morning did not want to wake up and avoided answering staff and then covered face with blanket. Patient did respond during nursing assessment and medication administration. Patient reports he does not want his blood drawn for labs. Patient is alert and oriented x 4, breathing unlabored, and extremities x 4 within normal limits. Patient is calm and cooperative with a flat affect. Patient did not display any disruptive behavior. Patient denies SI/HI/AH/VH. Will continue to monitor patient and notify MD of any changes.

## 2016-10-12 NOTE — Progress Notes (Signed)
[facility] MD Progress Note  10/12/2016 7:55 AM Cordney Barstow  MRN:  [id number] Subjective:  he is a 26 year old male with schizoaffective disorder who presented voluntarily to the emergency department on May 26 voicing suicidal ideation. Patient stated that he will hang himself because he doesn't like his group home. Also his upset because he has been trying to apply to a job at [employer] but says he cannot get this job because the group home is located too far from [employer].  Per chart review the patient was just discharged from our emergency department to a group home (5/25).  [facility], [address] [place], [place].  Patient also has a guardian named Jerrye Beavers for number [phone]  Per care everywhere looks like he was hospitalized in [employer] last month but we don't have access to the notes.  Per nursing staff he is withdrawn to her room and has refused all medications. He refused to uncover his face and talk to me today. He did not respond any of my questions.  All the information use to complete this assessment was obtained from the chart and conversation with staff.  5/29 once again not getting out of bed, not interacting with peers or staff. He was found lying in bed covered with blankets. He will not uncover his face to talk to me. He will not answer any questions. He has started taking his medications.  5/30 patient has been compliant with all medications. Yesterday I tried to start him on Invega but patient says that he is only willing to take Haldol. The Haldol was increased from twice a day to 3 times a day and he complied. He is slept 7 hours. He has been in 100% of his meals. He states he is from all day, he does not participate in groups and has minimal interactions with staff or peers.  Today he talked to me for the first time. He told me that he's upset and hopeless because he cannot find a job and he doesn't have [place] car.  5/31 Patient was  lying in bed, a little more cooperative and interactive today. He declined haldol injection that was offered.   Says he has no side effects from his mediations. Denies suicidality, homicidality or hallucinations.  6/1 still in bed, all covered with sheets. Denies depressed mood, problems with his sleep, appetite, energy or concentration. Denies feeling sad or hopeless. Denies suicidality, homicidality or auditory or visual hallucinations. Still not participating in programming. Minimal interactions with others.  Per nursing: Active, randomly out of his room, interaction with peers are still limited, out for snacks and medications, excited, "I will be discharged on Monday ..." Denied SI/HI/AVH   Principal Problem: Schizoaffective disorder, bipolar type ([place]) Diagnosis:   Patient Active Problem List   Diagnosis Date Noted  . Tobacco use disorder [F17.200] 10/07/2016  . Diabetes ([place]) [E11.9] 10/07/2016  . HTN (hypertension) [I10] 10/07/2016  . Dyslipidemia [E78.5] 10/07/2016  . Schizoaffective disorder, bipolar type ([place]) [F25.0] 11/30/2015   Total Time spent with patient: 30 minutes  Past Psychiatric History: unknown  Past Medical History:  Past Medical History:  Diagnosis Date  . Depression   . Diabetes mellitus without complication ([place])    History reviewed. No pertinent surgical history.  Family History: History reviewed. No pertinent family history.  Family Psychiatric  History: unknown  Social History:  History  Alcohol Use No     History  Drug Use No    Social History  Social History  . Marital status: Single    Spouse name: N/A  . Number of children: N/A  . Years of education: N/A   Social History Main Topics  . Smoking status: Current Every Day Smoker    Packs/day: 0.50    Types: Cigarettes  . Smokeless tobacco: Never Used  . Alcohol use No  . Drug use: No  . Sexual activity: Not Asked   Other Topics Concern  . None   Social History Narrative  .  None   Additional Social History:    Pain Medications: denies Prescriptions: See PTA Over the Counter: See PTA History of alcohol / drug use?: No history of alcohol / drug abuse Longest period of sobriety (when/how long): No past or current use of mind altering substances     Current Medications: Current Facility-Administered Medications  Medication Dose Route Frequency Provider Last Rate Last Dose  . acetaminophen (TYLENOL) tablet 650 mg  650 mg Oral Q6H PRN Pucilowska, Jolanta B, MD      . alum & mag hydroxide-simeth (MAALOX/MYLANTA) 200-200-20 MG/5ML suspension 30 mL  30 mL Oral Q4H PRN Pucilowska, Jolanta B, MD      . atorvastatin (LIPITOR) tablet 10 mg  10 mg Oral q1800 Pucilowska, Jolanta B, MD   10 mg at 10/11/16 1719  . diphenhydrAMINE (BENADRYL) capsule 50 mg  50 mg Oral QHS Hildred Priest, MD   50 mg at 10/11/16 2105  . divalproex (DEPAKOTE ER) 24 hr tablet 1,500 mg  1,500 mg Oral QHS Pucilowska, Jolanta B, MD   1,500 mg at 10/11/16 2106  . haloperidol (HALDOL) tablet 5 mg  5 mg Oral TID Hildred Priest, MD   5 mg at 10/11/16 1719  . lisinopril (PRINIVIL,ZESTRIL) tablet 10 mg  10 mg Oral Daily Pucilowska, Jolanta B, MD   10 mg at 10/11/16 0802  . LORazepam (ATIVAN) tablet 2 mg  2 mg Oral TID PRN Hildred Priest, MD   2 mg at 10/08/16 2016  . magnesium hydroxide (MILK OF MAGNESIA) suspension 30 mL  30 mL Oral Daily PRN Pucilowska, Jolanta B, MD      . metFORMIN (GLUCOPHAGE) tablet 1,000 mg  1,000 mg Oral BID WC Pucilowska, Jolanta B, MD   1,000 mg at 10/11/16 1719  . nicotine (NICODERM CQ - dosed in mg/24 hours) patch 21 mg  21 mg Transdermal Q0600 Pucilowska, Jolanta B, MD      . traZODone (DESYREL) tablet 100 mg  100 mg Oral QHS Pucilowska, Jolanta B, MD   100 mg at 10/11/16 2106    Lab Results: No results found for this or any previous visit (from the past 48 hour(s)).  Blood Alcohol level:  Lab Results  Component Value Date   ETH <5  10/02/2016   ETH <5 40/98/1191    Metabolic Disorder Labs: No results found for: HGBA1C, MPG No results found for: PROLACTIN No results found for: CHOL, TRIG, HDL, CHOLHDL, VLDL, LDLCALC  Physical Findings: AIMS: Facial and Oral Movements Muscles of Facial Expression: None, normal Lips and Perioral Area: None, normal Jaw: None, normal Tongue: None, normal,Extremity Movements Upper (arms, wrists, hands, fingers): None, normal Lower (legs, knees, ankles, toes): None, normal, Trunk Movements Neck, shoulders, hips: None, normal, Overall Severity Severity of abnormal movements (highest score from questions above): None, normal Incapacitation due to abnormal movements: None, normal Patient's awareness of abnormal movements (rate only patient's report): No Awareness, Dental Status Current problems with teeth and/or dentures?: No Does patient usually wear dentures?: No  CIWA:    COWS:     Musculoskeletal: Strength & Muscle Tone: within normal limits Gait & Station: normal Patient leans: N/A  Psychiatric Specialty Exam: Physical Exam  Constitutional: He is oriented to person, place, and time. He appears well-developed and well-nourished.  HENT:  Head: Normocephalic and atraumatic.  Eyes: EOM are normal.  Respiratory: Effort normal.  Musculoskeletal: Normal range of motion.  Neurological: He is alert and oriented to person, place, and time.    Review of Systems  Constitutional: Negative.   HENT: Negative.   Eyes: Negative.   Respiratory: Negative.   Cardiovascular: Negative.   Gastrointestinal: Negative.   Genitourinary: Negative.   Musculoskeletal: Negative.   Skin: Negative.   Neurological: Negative.   Endo/Heme/Allergies: Negative.   Psychiatric/Behavioral: Positive for depression. Negative for hallucinations, memory loss, substance abuse and suicidal ideas. The patient is not nervous/anxious and does not have insomnia.     Blood pressure 123/73, pulse 66, temperature  98 F (36.7 C), temperature source Oral, resp. rate 18, height 6' (1.829 m), weight 92.5 kg (204 lb), SpO2 100 %.Body mass index is 27.67 kg/m.  General Appearance: Disheveled  Eye Contact:  Minimal  Speech:  Clear and Coherent  Volume:  Decreased  Mood:  Irritable  Affect:  Constricted  Thought Process:  Linear and Descriptions of Associations: Intact  Orientation:  Full (Time, Place, and Person)  Thought Content:  Hallucinations: n/a  Suicidal Thoughts:  No  Homicidal Thoughts:  No  Memory:  Immediate;   Good Recent;   Good Remote;   Good  Judgement:  Poor  Insight:  Shallow  Psychomotor Activity:  Decreased  Concentration:  Concentration: Fair and Attention Span: Fair  Recall:  Poor  Fund of Knowledge:  Poor  Language:  Good  Akathisia:  No  Handed:    AIMS (if indicated):     Assets:  Physical Health  ADL's:  Intact  Cognition:  WNL  Sleep:  Number of Hours: 7.15     Treatment Plan Summary:  Minimal improvement since admission  Patient is a 26 year old male with history of schizoaffective disorder. The patient has a guardian. He presents to the emergency department 1 day after being discharged to a group home from our ER.  Says his back because he dislikes his group home and is suicidal.   Schizoaffective disorder continue haloperidol 5 mg three times a day and Depakote 1500 mg by mouth daily at bedtime. Patient refused haldol injection decanoate that was offered. Check Depakote level this evening  Insomnia continue trazodone 100 mg by mouth daily at bedtime  Hypertension continue lisinopril 10 mg a day  Diabetes continue metformin 1000 mg twice a day  Dyslipidemia continue Lipitor 10 mg a day  Tobacco use disorder continue nicotine patch 21 mg a day  Diet low sodium and carbohydrate modified  Vital signs daily  Hospitalization and status continue involuntary commitment  Follow-up: Patient met with [facility] on 5/31. Patient was very happy to hear  that [facility] can assist him in finding a job and connecting him with vocational rehabilitation  Disposition back to group home  Labs patient is hemoglobin A1c, lipid panel and TSH but he has been refusing blood work---will try this evening  Possible d/c early next week.   Hildred Priest, MD 10/12/2016, 7:55 AM

## 2016-10-12 NOTE — [facility] Group Notes (Signed)
[facility] LCSW Group Therapy  10/12/2016 2:03 PM  Type of Therapy:  Group Therapy  Participation Level:  Patient did not attend group. CSW invited patient to group.   Summary of Progress/Problems: Safety Planning: Patients identified fears or worries surrounding discharge. Patients offered support to their peers and openly developed safety plans for their individual needs. Patients developed their own safety plan. Patients discussed their warning signs, coping strategies, support system with family and friends, identified mental health professionals, and how to keep their environments safe (ex. Removing unnecessary medications or removing weapons/guns). Patients then discussed their personalized safety plan with the group.   Jenina Moening G. Garnette CzechSampson MSW, LCSWA 10/12/2016, 2:04 PM

## 2016-10-12 NOTE — Plan of Care (Signed)
Problem: Activity: Goal: Sleeping patterns will improve Outcome: Progressing Patient slept for Estimated Hours of 7.15; every 15 minutes safety round maintained, no injury or falls during this shift.    

## 2016-10-12 NOTE — Progress Notes (Signed)
Recreation Therapy Notes  Date: 06.01.18 Time: 9:30 am Location: Craft Room  Group Topic: Coping Skills  Goal Area(s) Addresses:  Patient will participate in healthy coping skill. Patient will verbalize benefit of using art as a coping skill.  Behavioral Response: Did not attend  Intervention: Coloring  Activity: Patients were given coloring sheets to color and were instructed to think about what emotions they were feeling as well as what their minds were focused on.  Education: LRT educated patients on healthy coping skills.  Education Outcome: Patient did not attend group.  Clinical Observations/Feedback: Patient did not attend group.  Suzi Hernan M, LRT/CTRS 10/12/2016 10:31 AM 

## 2016-10-12 NOTE — Discharge Summary (Signed)
Physician Discharge Summary Note  Patient:  John Copeland is an 26 y.o., male MRN:  [id number] DOB:  1990/10/05 Patient phone:  [phone] (home)  Patient address:   [address] [place] [address],  Total Time spent with patient: 30 minutes  Date of Admission:  10/08/2016 Date of Discharge: 10/15/16  Reason for Admission:  SI  Principal Problem: Schizoaffective disorder, bipolar type [facility]) Discharge Diagnoses: Patient Active Problem List   Diagnosis Date Noted  . Tobacco use disorder [F17.200] 10/07/2016  . Diabetes ([place]) [E11.9] 10/07/2016  . HTN (hypertension) [I10] 10/07/2016  . Dyslipidemia [E78.5] 10/07/2016  . Schizoaffective disorder, bipolar type ([place]) [F25.0] 11/30/2015   History of Present Illness:  She is a 26 year old male with schizoaffective disorder who presented voluntarily to the emergency department on May 26 voicing suicidal ideation. Patient stated that he will hang himself because he doesn't like his group home. Also his upset because he has been trying to apply to a job at [employer] but says he cannot get this job because the group home is located too far from [employer].  Per chart review the patient was just discharged from our emergency department to a group home (5/25).  [facility], [place] [place], [place].  Patient also has a guardian named Jerrye Beavers for number [phone]  Per care everywhere looks like he was hospitalized in [name] last month but we don't have access to the notes.  Per nursing staff he is withdrawn to her room and has refused all medications. He refused to uncover his face and talk to me today. He did not respond any of my questions.  All the information use to complete this assessment was obtained from the chart and conversation with staff.  Substance abuse: not known  Trauma: not known    Associated Signs/Symptoms: Depression Symptoms:  depressed  mood, insomnia, hopelessness, recurrent thoughts of death, (Hypo) Manic Symptoms:  Distractibility, Impulsivity, Irritable Mood, Anxiety Symptoms:  Excessive Worry, Psychotic Symptoms:  denies PTSD Symptoms: NA Total Time spent with patient: 1 hour  Past Psychiatric History: not known  Past Medical History:  Past Medical History:  Diagnosis Date  . Depression   . Diabetes mellitus without complication ([place])    History reviewed. No pertinent surgical history.  Family History: History reviewed. No pertinent family history.  Family Psychiatric  History: Unknown  Social History:  History  Alcohol Use No     History  Drug Use No    Social History   Social History  . Marital status: Single    Spouse name: N/A  . Number of children: N/A  . Years of education: N/A   Social History Main Topics  . Smoking status: Current Every Day Smoker    Packs/day: 0.50    Types: Cigarettes  . Smokeless tobacco: Never Used  . Alcohol use No  . Drug use: No  . Sexual activity: Not Asked   Other Topics Concern  . None   Social History Narrative  . None    Hospital Course:    Minimal improvement since admission  Patient is a 26 year old male with history of schizoaffective disorder. The patient has a guardian. He presents to the emergency department 1 day after being discharged to a group home from our ER. Says his back because he dislikes his group home and is suicidal.   Schizoaffective disorder continue haloperidol 5 mg three times a day and Depakote 1500 mg by mouth daily at bedtime. Patient refused haldol  injection decanoate that was offered. Patient also refused Depakote level  Insomnia continue trazodone 100 mg by mouth daily at bedtime  Hypertension continue lisinopril 10 mg a day  Diabetes continue metformin 1000 mg twice a day  Dyslipidemia continue Lipitor 10 mg a day  Tobacco use disorder: pt received nicotine patch 21 mg a day   Follow-up:  Patient met with [facility] on 5/[facility]. [facility] has not get accepted the patient. In the meantime will have an appointment with [facility] until he completes the assessment with [facility].  Disposition back to group home  Labs patient is hemoglobin A1c, lipid panel and TSH but he has been refusing blood work--- we tried several different times to get blood work from the patient but he refused  This hospitalization was uneventful. The patient unfortunately was withdrawn to his room. He had minimal interaction with peers and the staff. He did not participated in programming. He left his only for meals. He did not have any episodes of aggression or agitation during his stay in the unit. He did not require seclusion, restraints or forced medications.  Today he denies problems with mood appetite, energy, and sleep or concentration. He denies suicidality, homicidality or auditory or visual hallucinations. He denies side effects from medications. He denies any physical complaints.  The patient does not appear to be delusional or paranoid. He is not interacting to internal stimuli. There is no symptoms consistent with mania or hypomania. He has been compliant with all his medications.  Staff working with the patient's feel his much improved and ready for discharge. They do not have any concern about his safety or the safety of others.  This patient does not have any access to guns.    Physical Findings: AIMS: Facial and Oral Movements Muscles of Facial Expression: None, normal Lips and Perioral Area: None, normal Jaw: None, normal Tongue: None, normal,Extremity Movements Upper (arms, wrists, hands, fingers): None, normal Lower (legs, knees, ankles, toes): None, normal, Trunk Movements Neck, shoulders, hips: None, normal, Overall Severity Severity of abnormal movements (highest score from questions above): None, normal Incapacitation due to abnormal movements: None, normal Patient's awareness of abnormal movements  (rate only patient's report): No Awareness, Dental Status Current problems with teeth and/or dentures?: No Does patient usually wear dentures?: No  CIWA:    COWS:     Musculoskeletal: Strength & Muscle Tone: within normal limits Gait & Station: normal Patient leans: N/A  Psychiatric Specialty Exam: Physical Exam  Constitutional: He is oriented to person, place, and time. He appears well-developed and well-nourished.  HENT:  Head: Normocephalic and atraumatic.  Eyes: Conjunctivae and EOM are normal.  Neck: Normal range of motion.  Respiratory: Effort normal.  Musculoskeletal: Normal range of motion.  Neurological: He is alert and oriented to person, place, and time.    Review of Systems  Constitutional: Negative.   HENT: Negative.   Eyes: Negative.   Respiratory: Negative.   Cardiovascular: Negative.   Gastrointestinal: Negative.   Genitourinary: Negative.   Musculoskeletal: Negative.   Skin: Negative.   Neurological: Negative.   Endo/Heme/Allergies: Negative.   Psychiatric/Behavioral: Negative.     Blood pressure 108/67, pulse 71, temperature 98 F (36.7 C), temperature source Oral, resp. rate 18, height 6' (1.829 m), weight 92.5 kg (204 lb), SpO2 100 %.Body mass index is 27.67 kg/m.  General Appearance: Fairly Groomed  Eye Contact:  Good  Speech:  Clear and Coherent  Volume:  Normal  Mood:  Irritable  Affect:  Constricted  Thought  Process:  Linear and Descriptions of Associations: Intact  Orientation:  Full (Time, Place, and Person)  Thought Content:  Hallucinations: None  Suicidal Thoughts:  No  Homicidal Thoughts:  No  Memory:  Immediate;   Fair Recent;   Fair Remote;   Fair  Judgement:  Poor  Insight:  Shallow  Psychomotor Activity:  Normal  Concentration:  Concentration: Fair and Attention Span: Fair  Recall:  [employer] of Knowledge:  Fair  Language:  Good  Akathisia:  No  Handed:    AIMS (if indicated):     Assets:  Armed forces logistics/support/administrative officer Social  Support  ADL's:  Intact  Cognition:  WNL  Sleep:  Number of Hours: 7.5     Have you used any form of tobacco in the last 30 days? (Cigarettes, Smokeless Tobacco, Cigars, and/or Pipes): Yes  Has this patient used any form of tobacco in the last 30 days? (Cigarettes, Smokeless Tobacco, Cigars, and/or Pipes) Yes, Yes, A prescription for an FDA-approved tobacco cessation medication was offered at discharge and the patient refused  Blood Alcohol level:  Lab Results  Component Value Date   ETH <5 10/02/2016   ETH <5 01/75/1025    Metabolic Disorder Labs: Patient refused hemoglobin A1c, lipid panel and other bloodwork due to his mental state No results found for: HGBA1C, MPG No results found for: PROLACTIN No results found for: CHOL, TRIG, HDL, CHOLHDL, VLDL, LDLCALC   Results for DIANNE, BADY (MRN [id number]) as of 10/12/2016 12:15  Ref. Range 10/02/2016 20:37 10/05/2016 15:20 10/06/2016 18:56 10/08/2016 07:43  COMPREHENSIVE METABOLIC PANEL Unknown Rpt (A)     Sodium Latest Ref Range: 135 - 145 mmol/L 138     Potassium Latest Ref Range: 3.5 - 5.1 mmol/L 4.2     Chloride Latest Ref Range: 101 - 111 mmol/L 101     CO2 Latest Ref Range: 22 - 32 mmol/L 29     Glucose Latest Ref Range: 65 - 99 mg/dL 67     BUN Latest Ref Range: 6 - 20 mg/dL 16     Creatinine Latest Ref Range: 0.61 - 1.24 mg/dL 1.17     Calcium Latest Ref Range: 8.9 - 10.3 mg/dL 9.9     Anion gap Latest Ref Range: 5 - 15  8     Alkaline Phosphatase Latest Ref Range: 38 - 126 U/L 46     Albumin Latest Ref Range: 3.5 - 5.0 g/dL 4.8     AST Latest Ref Range: 15 - 41 U/L 87 (H)     ALT Latest Ref Range: 17 - 63 U/L 33     Total Protein Latest Ref Range: 6.5 - 8.1 g/dL 8.1     Total Bilirubin Latest Ref Range: 0.3 - 1.2 mg/dL 0.6     EGFR (African American) Latest Ref Range: >60 mL/min >60     EGFR (Non-African Amer.) Latest Ref Range: >60 mL/min >60     WBC Latest Ref Range: 3.8 - 10.6 K/uL 10.9 (H)     RBC Latest Ref Range:  4.40 - 5.90 MIL/uL 5.87     Hemoglobin Latest Ref Range: 13.0 - 18.0 g/dL 13.9     HCT Latest Ref Range: 40.0 - 52.0 % 43.1     MCV Latest Ref Range: 80.0 - 100.0 fL 73.5 (L)     MCH Latest Ref Range: 26.0 - 34.0 pg 23.6 (L)     MCHC Latest Ref Range: 32.0 - 36.0 g/dL 32.2     RDW  Latest Ref Range: 11.5 - 14.5 % 14.4     Platelets Latest Ref Range: 150 - 440 K/uL 255     Appearance Latest Ref Range: CLEAR  CLEAR (A)     Bacteria, UA Latest Ref Range: NONE SEEN  RARE (A)     Bilirubin Urine Latest Ref Range: NEGATIVE  SMALL (A)     Color, Urine Latest Ref Range: YELLOW  YELLOW (A)     Glucose Latest Ref Range: NEGATIVE mg/dL NEGATIVE     Hgb urine dipstick Latest Ref Range: NEGATIVE  NEGATIVE     Ketones, ur Latest Ref Range: NEGATIVE mg/dL 5 (A)     Leukocytes, UA Latest Ref Range: NEGATIVE  NEGATIVE     Nitrite Latest Ref Range: NEGATIVE  NEGATIVE     pH Latest Ref Range: 5.0 - 8.0  5.0     Protein Latest Ref Range: NEGATIVE mg/dL NEGATIVE     RBC / HPF Latest Ref Range: 0 - 5 RBC/hpf 0-5     Specific Gravity, Urine Latest Ref Range: 1.005 - 1.030  1.024     Squamous Epithelial / LPF Latest Ref Range: NONE SEEN  NONE SEEN     WBC, UA Latest Ref Range: 0 - 5 WBC/hpf 0-5     Alcohol, Ethyl (B) Latest Ref Range: <5 mg/dL <5     Amphetamines, Ur Screen Latest Ref Range: NONE DETECTED  NONE DETECTED     Barbiturates, Ur Screen Latest Ref Range: NONE DETECTED  NONE DETECTED     Benzodiazepine, Ur Scrn Latest Ref Range: NONE DETECTED  NONE DETECTED     Cocaine Metabolite,Ur [place] Latest Ref Range: NONE DETECTED  NONE DETECTED     Methadone Scn, Ur Latest Ref Range: NONE DETECTED  NONE DETECTED     MDMA (Ecstasy)Ur Screen Latest Ref Range: NONE DETECTED  NONE DETECTED     Cannabinoid 50 Ng, Ur [place] Latest Ref Range: NONE DETECTED  NONE DETECTED     Opiate, Ur Screen Latest Ref Range: NONE DETECTED  NONE DETECTED     Phencyclidine (PCP) Ur S Latest Ref Range: NONE DETECTED  NONE DETECTED      Tricyclic, Ur Screen Latest Ref Range: NONE DETECTED  NONE DETECTED     DG CHEST 2 VIEW Unknown  Rpt    EKG 12-LEAD Unknown    Rpt    See Psychiatric Specialty Exam and Suicide Risk Assessment completed by Attending Physician prior to discharge.  Discharge destination:  Other:  [place]  Is patient on multiple antipsychotic therapies at discharge:  No   Has Patient had three or more failed trials of antipsychotic monotherapy by history:  No  Recommended Plan for Multiple Antipsychotic Therapies: NA   Allergies as of 10/15/2016   No Known Allergies     Medication List    STOP taking these medications   THEREMS PO   Vitamin D (Ergocalciferol) 50000 units Caps capsule Commonly known as:  DRISDOL     TAKE these medications     Indication  atorvastatin 10 MG tablet Commonly known as:  LIPITOR Take 1 tablet (10 mg total) by mouth daily at 6 PM.  Indication:  High Amount of Fats in the Blood   diphenhydrAMINE 50 MG capsule Commonly known as:  BENADRYL Take 1 capsule (50 mg total) by mouth at bedtime.  Indication:  Extrapyramidal Reaction   divalproex 500 MG 24 hr tablet Commonly known as:  DEPAKOTE ER Take 3 tablets (1,500 mg total) by  mouth at bedtime.  Indication:  Manic Phase of Manic-Depression   haloperidol 5 MG tablet Commonly known as:  HALDOL Take 1 tablet (5 mg total) by mouth 3 (three) times daily. What changed:  when to take this  Indication:  Psychosis   lisinopril 10 MG tablet Commonly known as:  PRINIVIL,ZESTRIL Take 1 tablet (10 mg total) by mouth daily.  Indication:  High Blood Pressure Disorder   metFORMIN 1000 MG tablet Commonly known as:  GLUCOPHAGE Take 1 tablet (1,000 mg total) by mouth 2 (two) times daily with a meal.  Indication:  Type 2 Diabetes   traZODone 100 MG tablet Commonly known as:  DESYREL Take 1 tablet (100 mg total) by mouth at bedtime.  Indication:  [place] on 10/17/2016.   Why:  Please arrive to [place] on Wednesday, June 6 at12:30PM. Please bring discharge paperwork to this appointment. Contact information: [place] [address] [phone]        [facility] Team. Call on 10/19/2016.   Why:  Please call and check with [facility] Team for your intake assessment. ACT Team Lead, Elmo Putt will be able to provide more information.  Contact information: Address: [address], [place], [place] [address] Ph#: [phone] Fax: [phone]         >30 minutes. >50 % of the time was spent in coordination of care  Signed: Hildred Priest, MD 10/15/2016, 12:56 PM

## 2016-10-12 NOTE — Plan of Care (Signed)
Problem: Safety: Goal: Periods of time without injury will increase Outcome: Progressing Pt has not displayed in self injurious behavior while on the unit.

## 2016-10-13 NOTE — [facility] Group Notes (Signed)
[facility] LCSW Group Therapy  10/13/2016 2:02 PM  Type of Therapy:  Group Therapy  Participation Level:  Patient did not attend group. CSW invited patient to group.   Summary of Progress/Problems: Boundaries: Patients defined boundaries and discussed the importance having clear boundaries within their relationships. Patients identified their own boundaries. Patients established limits and rules within relationships both personally and professionally. Patients discussed ways to create and/ or improve their personal boundaries and utilizing assertive communications skills.   John Copeland G. Garnette CzechSampson MSW, LCSWA 10/13/2016, 2:02 PM

## 2016-10-13 NOTE — Progress Notes (Signed)
[facility] MD Progress Note  10/13/2016 1:05 PM John Copeland  MRN:  [id number] Subjective:  he is a 26 year old male with schizoaffective disorder who presented voluntarily to the emergency department on May 26 voicing suicidal ideation. Patient stated that he will hang himself because he doesn't like his group home. Also his upset because he has been trying to apply to a job at [employer] but says he cannot get this job because the group home is located too far from [employer].  Patient  has a guardian named Jerrye Beavers for number [phone]   6/2 pt in bed, guarded, uncooperative.  Endorses depression but denies SI. Denies AVH. States he refused lab because it "hurts", has limited insight and judgement.    Per nursing: Refusing labs. Patient withdrawn to room most of shift. Patient in the morning did not want to wake up and avoided answering staff and then covered face with blanket. Patient did respond during nursing assessment and medication administration. Patient reports he does not want his blood drawn for labs. Patient is alert and oriented x 4, breathing unlabored, and extremities x 4 within normal limits. Patient is calm and cooperative with a flat affect. Patient did not display any disruptive behavior. Patient denies SI/HI/AH/VH   Principal Problem: Schizoaffective disorder, bipolar type ([place]) Diagnosis:   Patient Active Problem List   Diagnosis Date Noted  . Tobacco use disorder [F17.200] 10/07/2016  . Diabetes ([place]) [E11.9] 10/07/2016  . HTN (hypertension) [I10] 10/07/2016  . Dyslipidemia [E78.5] 10/07/2016  . Schizoaffective disorder, bipolar type ([place]) [F25.0] 11/30/2015   Total Time spent with patient: 30 minutes  Past Psychiatric History: unknown  Past Medical History:  Past Medical History:  Diagnosis Date  . Depression   . Diabetes mellitus without complication ([place])    History reviewed. No pertinent surgical history.  Family History: History reviewed. No pertinent  family history.  Family Psychiatric  History: unknown  Social History:  History  Alcohol Use No     History  Drug Use No    Social History   Social History  . Marital status: Single    Spouse name: N/A  . Number of children: N/A  . Years of education: N/A   Social History Main Topics  . Smoking status: Current Every Day Smoker    Packs/day: 0.50    Types: Cigarettes  . Smokeless tobacco: Never Used  . Alcohol use No  . Drug use: No  . Sexual activity: Not Asked   Other Topics Concern  . None   Social History Narrative  . None   Additional Social History:    Pain Medications: denies Prescriptions: See PTA Over the Counter: See PTA History of alcohol / drug use?: No history of alcohol / drug abuse Longest period of sobriety (when/how long): No past or current use of mind altering substances     Current Medications: Current Facility-Administered Medications  Medication Dose Route Frequency Provider Last Rate Last Dose  . acetaminophen (TYLENOL) tablet 650 mg  650 mg Oral Q6H PRN Pucilowska, Jolanta B, MD   650 mg at 10/12/16 1950  . alum & mag hydroxide-simeth (MAALOX/MYLANTA) 200-200-20 MG/5ML suspension 30 mL  30 mL Oral Q4H PRN Pucilowska, Jolanta B, MD      . atorvastatin (LIPITOR) tablet 10 mg  10 mg Oral q1800 Pucilowska, Jolanta B, MD   10 mg at 10/12/16 1711  . diphenhydrAMINE (BENADRYL) capsule 50 mg  50 mg Oral QHS Hildred Priest, MD   50 mg at 10/12/16 2205  .  divalproex (DEPAKOTE ER) 24 hr tablet 1,500 mg  1,500 mg Oral QHS Pucilowska, Jolanta B, MD   1,500 mg at 10/12/16 2205  . haloperidol (HALDOL) tablet 5 mg  5 mg Oral TID Hildred Priest, MD   5 mg at 10/13/16 1137  . lisinopril (PRINIVIL,ZESTRIL) tablet 10 mg  10 mg Oral Daily Pucilowska, Jolanta B, MD   10 mg at 10/13/16 0906  . LORazepam (ATIVAN) tablet 2 mg  2 mg Oral TID PRN Hildred Priest, MD   2 mg at 10/08/16 2016  . magnesium hydroxide (MILK OF MAGNESIA)  suspension 30 mL  30 mL Oral Daily PRN Pucilowska, Jolanta B, MD      . metFORMIN (GLUCOPHAGE) tablet 1,000 mg  1,000 mg Oral BID WC Pucilowska, Jolanta B, MD   1,000 mg at 10/13/16 0906  . nicotine (NICODERM CQ - dosed in mg/24 hours) patch 21 mg  21 mg Transdermal Q0600 Pucilowska, Jolanta B, MD      . traZODone (DESYREL) tablet 100 mg  100 mg Oral QHS Pucilowska, Jolanta B, MD   100 mg at 10/12/16 2205    Lab Results: No results found for this or any previous visit (from the past 48 hour(s)).  Blood Alcohol level:  Lab Results  Component Value Date   ETH <5 10/02/2016   ETH <5 40/98/1191    Metabolic Disorder Labs: No results found for: HGBA1C, MPG No results found for: PROLACTIN No results found for: CHOL, TRIG, HDL, CHOLHDL, VLDL, LDLCALC  Physical Findings: AIMS: Facial and Oral Movements Muscles of Facial Expression: None, normal Lips and Perioral Area: None, normal Jaw: None, normal Tongue: None, normal,Extremity Movements Upper (arms, wrists, hands, fingers): None, normal Lower (legs, knees, ankles, toes): None, normal, Trunk Movements Neck, shoulders, hips: None, normal, Overall Severity Severity of abnormal movements (highest score from questions above): None, normal Incapacitation due to abnormal movements: None, normal Patient's awareness of abnormal movements (rate only patient's report): No Awareness, Dental Status Current problems with teeth and/or dentures?: No Does patient usually wear dentures?: No  CIWA:    COWS:     Musculoskeletal: Strength & Muscle Tone: within normal limits Gait & Station: normal Patient leans: N/A  Psychiatric Specialty Exam: Physical Exam  Nursing note and vitals reviewed. Constitutional: He is oriented to person, place, and time. He appears well-developed and well-nourished.  HENT:  Head: Normocephalic and atraumatic.  Eyes: EOM are normal.  Respiratory: Effort normal.  Musculoskeletal: Normal range of motion.   Neurological: He is alert and oriented to person, place, and time.    Review of Systems  Constitutional: Negative.   HENT: Negative.   Eyes: Negative.   Respiratory: Negative.   Cardiovascular: Negative.   Gastrointestinal: Negative.   Genitourinary: Negative.   Musculoskeletal: Negative.   Skin: Negative.   Neurological: Negative.   Endo/Heme/Allergies: Negative.   Psychiatric/Behavioral: Positive for depression. Negative for hallucinations, memory loss, substance abuse and suicidal ideas. The patient is not nervous/anxious and does not have insomnia.     Blood pressure 115/72, pulse 66, temperature 98 F (36.7 C), temperature source Oral, resp. rate 18, height 6' (1.829 m), weight 92.5 kg (204 lb), SpO2 100 %.Body mass index is 27.67 kg/m.  General Appearance: Disheveled  Eye Contact:  Minimal  Speech:  Clear and Coherent  Volume:  Decreased  Mood:  Irritable  Affect:  blunted  Thought Process:  Linear and Descriptions of Associations: Intact  Orientation:  Full (Time, Place, and Person)  Thought Content:  Hallucinations: n/a  Suicidal Thoughts:  No  Homicidal Thoughts:  No  Memory:  Immediate;   Good Recent;   Good Remote;   Good  Judgement:  Poor  Insight:  Shallow  Psychomotor Activity:  Decreased  Concentration:  Concentration: Fair and Attention Span: Fair  Recall:  Poor  Fund of Knowledge:  Poor  Language:  Good  Akathisia:  No  Handed:    AIMS (if indicated):     Assets:  Physical Health  ADL's:  Intact  Cognition:  WNL  Sleep:  Number of Hours: 7     Treatment Plan Summary:  Minimal improvement since admission  Patient is a 26 year old male with history of schizoaffective disorder. The patient has a guardian. He presents to the emergency department 1 day after being discharged to a group home from our ER.  Says his back because he dislikes his group home and is suicidal.   Schizoaffective disorder continue haloperidol 5 mg three times a day and  Depakote 1500 mg by mouth daily at bedtime. Patient refused haldol injection decanoate that was offered. Check Depakote level this evening  Insomnia continue trazodone 100 mg by mouth daily at bedtime  Hypertension continue lisinopril 10 mg a day  Diabetes continue metformin 1000 mg twice a day  Dyslipidemia continue Lipitor 10 mg a day  Tobacco use disorder continue nicotine patch 21 mg a day  Diet low sodium and carbohydrate modified  Vital signs daily  Hospitalization and status continue involuntary commitment  Follow-up: Patient met with [facility] on 5/31. Patient was very happy to hear that [facility] can assist him in finding a job and connecting him with vocational rehabilitation  Disposition back to group home  Labs patient is hemoglobin A1c, lipid panel and TSH but he has been refusing blood work---will try again.  Possible d/c early next week.   Lenward Chancellor, MD 10/13/2016, 1:05 PMPatient ID: John Copeland, male   DOB: 1990-10-30, 26 y.o.   MRN: [id number]

## 2016-10-13 NOTE — Progress Notes (Signed)
Patient appears to not be a morning person and doesn't get up unitl around 1100 or 1200. Patient is cooperative with medications and come out for meals, but slept through breakfast today after staff attempted several times to wake patient up. Patient did go outside for a while with staff and other peers. Patient is alert and oriented x 4, breathing unlabored, and extremities x 4 within normal limits. Patient is calm and cooperative. Patient did not display any disruptive behavior. Will continue to monitor patient and notify MD of any changes.

## 2016-10-13 NOTE — Plan of Care (Signed)
Problem: Activity: Goal: Interest or engagement in leisure activities will improve Outcome: Not Progressing Patient continues to isolate self in room and only comes out for meals. Patient appears hesitant to be surrounded around people, such as when at medication room pt appears anxious and ready to get away and fidgets slightly until medication are received. Sometimes patient will leave the area if it takes a little longer than expected. Goal: Imbalance in normal sleep/wake cycle will improve Outcome: Progressing Patient denies any issues with sleep and appears to be getting adequate rest during the night.

## 2016-10-13 NOTE — [facility] Group Notes (Signed)
[facility] Group Notes:  (Nursing/MHT/Case Management/Adjunct)  Date:  10/13/2016  Time:  11:13 PM  Type of Therapy:  Psychoeducational Skills  Participation Level:  Active  Participation Quality:  Appropriate  Affect:  Appropriate  Cognitive:  Appropriate  Insight:  Appropriate  Engagement in Group:  Engaged  Modes of Intervention:  Discussion, Socialization and Support  Summary of Progress/Problems:  Chancy MilroyLaquanda Y Intisar Claudio 10/13/2016, 11:13 PM

## 2016-10-14 NOTE — Plan of Care (Signed)
Problem: Role Relationship: Goal: Ability to demonstrate positive changes in social behaviors and relationships will improve Outcome: Not Progressing Isolative in room. Not engaged in unit activities

## 2016-10-14 NOTE — Progress Notes (Signed)
Patient was withdrawn and slept most of shift in room. Patient is compliant with medications. Patient is compliant with meals. Patient is alert and oriented x 4, breathing unlabored, and extremities x 4 within normal limits. Patient is calm and cooperative. Patient did not display any disruptive behavior. Patient denies SI/HI/AH/VH, Will continue to monitor patient and notify MD of any changes.

## 2016-10-14 NOTE — Progress Notes (Signed)
[facility] MD Progress Note  10/14/2016 1:32 PM John Copeland  MRN:  [id number] Subjective:  he is a 26 year old male with schizoaffective disorder who presented voluntarily to the emergency department on May 26 voicing suicidal ideation. Patient stated that he will hang himself because he doesn't like his group home. Also his upset because he has been trying to apply to a job at [employer] but says he cannot get this job because the group home is located too far from [employer].  Patient  has  guardianJerrye Beavers for number [phone]    6/3 No significant change since yesterday. Pt continue to be  guarded, uncooperative.  Isolative in room. Not engaged in unit activities. Endorses depression but denies SI. Denies AVH. Refusing labs. , has limited insight and judgement.    Per nursing: Patient appears to not be a morning person and doesn't get up unitl around 1100 or 1200. Patient is cooperative with medications and come out for meals, but slept through breakfast today after staff attempted several times to wake patient up. Patient did go outside for a while with staff and other peers. Patient is alert and oriented x 4, breathing unlabored, and extremities x 4 within normal limits. Patient is calm and cooperative. Patient did not display any disruptive behavior. Isolative in room. Not engaged in unit activities. Patient was mostly in room. Presented to the medication room and discussed his medications. Reported that he is feeling well, that the medication is working. Reported that he does not have any problem at the group home. Principal Problem: Schizoaffective disorder, bipolar type ([place]) Diagnosis:   Patient Active Problem List   Diagnosis Date Noted  . Tobacco use disorder [F17.200] 10/07/2016  . Diabetes ([place]) [E11.9] 10/07/2016  . HTN (hypertension) [I10] 10/07/2016  . Dyslipidemia [E78.5] 10/07/2016  . Schizoaffective disorder, bipolar type ([place]) [F25.0] 11/30/2015   Total Time spent  with patient: 30 minutes  Past Psychiatric History: unknown  Past Medical History:  Past Medical History:  Diagnosis Date  . Depression   . Diabetes mellitus without complication ([place])    History reviewed. No pertinent surgical history.  Family History: History reviewed. No pertinent family history.  Family Psychiatric  History: unknown  Social History:  History  Alcohol Use No     History  Drug Use No    Social History   Social History  . Marital status: Single    Spouse name: N/A  . Number of children: N/A  . Years of education: N/A   Social History Main Topics  . Smoking status: Current Every Day Smoker    Packs/day: 0.50    Types: Cigarettes  . Smokeless tobacco: Never Used  . Alcohol use No  . Drug use: No  . Sexual activity: Not Asked   Other Topics Concern  . None   Social History Narrative  . None   Additional Social History:    Pain Medications: denies Prescriptions: See PTA Over the Counter: See PTA History of alcohol / drug use?: No history of alcohol / drug abuse Longest period of sobriety (when/how long): No past or current use of mind altering substances     Current Medications: Current Facility-Administered Medications  Medication Dose Route Frequency Provider Last Rate Last Dose  . acetaminophen (TYLENOL) tablet 650 mg  650 mg Oral Q6H PRN Pucilowska, Jolanta B, MD   650 mg at 10/12/16 1950  . alum & mag hydroxide-simeth (MAALOX/MYLANTA) 200-200-20 MG/5ML suspension 30 mL  30 mL Oral Q4H PRN Pucilowska, Jolanta  B, MD      . atorvastatin (LIPITOR) tablet 10 mg  10 mg Oral q1800 Pucilowska, Jolanta B, MD   10 mg at 10/13/16 1700  . diphenhydrAMINE (BENADRYL) capsule 50 mg  50 mg Oral QHS Hildred Priest, MD   50 mg at 10/13/16 2054  . divalproex (DEPAKOTE ER) 24 hr tablet 1,500 mg  1,500 mg Oral QHS Pucilowska, Jolanta B, MD   1,500 mg at 10/13/16 2054  . haloperidol (HALDOL) tablet 5 mg  5 mg Oral TID Hildred Priest,  MD   5 mg at 10/14/16 1136  . lisinopril (PRINIVIL,ZESTRIL) tablet 10 mg  10 mg Oral Daily Pucilowska, Jolanta B, MD   10 mg at 10/14/16 0849  . LORazepam (ATIVAN) tablet 2 mg  2 mg Oral TID PRN Hildred Priest, MD   2 mg at 10/08/16 2016  . magnesium hydroxide (MILK OF MAGNESIA) suspension 30 mL  30 mL Oral Daily PRN Pucilowska, Jolanta B, MD      . metFORMIN (GLUCOPHAGE) tablet 1,000 mg  1,000 mg Oral BID WC Pucilowska, Jolanta B, MD   1,000 mg at 10/14/16 0849  . nicotine (NICODERM CQ - dosed in mg/24 hours) patch 21 mg  21 mg Transdermal Q0600 Pucilowska, Jolanta B, MD      . traZODone (DESYREL) tablet 100 mg  100 mg Oral QHS Pucilowska, Jolanta B, MD   100 mg at 10/13/16 2054    Lab Results: No results found for this or any previous visit (from the past 48 hour(s)).  Blood Alcohol level:  Lab Results  Component Value Date   ETH <5 10/02/2016   ETH <5 41/74/0814    Metabolic Disorder Labs: No results found for: HGBA1C, MPG No results found for: PROLACTIN No results found for: CHOL, TRIG, HDL, CHOLHDL, VLDL, LDLCALC  Physical Findings: AIMS: Facial and Oral Movements Muscles of Facial Expression: None, normal Lips and Perioral Area: None, normal Jaw: None, normal Tongue: None, normal,Extremity Movements Upper (arms, wrists, hands, fingers): None, normal Lower (legs, knees, ankles, toes): None, normal, Trunk Movements Neck, shoulders, hips: None, normal, Overall Severity Severity of abnormal movements (highest score from questions above): None, normal Incapacitation due to abnormal movements: None, normal Patient's awareness of abnormal movements (rate only patient's report): No Awareness, Dental Status Current problems with teeth and/or dentures?: No Does patient usually wear dentures?: No  CIWA:    COWS:     Musculoskeletal: Strength & Muscle Tone: within normal limits Gait & Station: normal Patient leans: N/A  Psychiatric Specialty Exam: Physical Exam   Nursing note and vitals reviewed. Constitutional: He is oriented to person, place, and time. He appears well-developed and well-nourished.  HENT:  Head: Normocephalic and atraumatic.  Eyes: EOM are normal.  Respiratory: Effort normal.  Musculoskeletal: Normal range of motion.  Neurological: He is alert and oriented to person, place, and time.    Review of Systems  Constitutional: Negative.   HENT: Negative.   Eyes: Negative.   Respiratory: Negative.   Cardiovascular: Negative.   Gastrointestinal: Negative.   Genitourinary: Negative.   Musculoskeletal: Negative.   Skin: Negative.   Neurological: Negative.   Endo/Heme/Allergies: Negative.   Psychiatric/Behavioral: Positive for depression. Negative for hallucinations, memory loss, substance abuse and suicidal ideas. The patient is not nervous/anxious and does not have insomnia.     Blood pressure 108/67, pulse 71, temperature 98 F (36.7 C), temperature source Oral, resp. rate 18, height 6' (1.829 m), weight 92.5 kg (204 lb), SpO2 100 %.Body mass index is  27.67 kg/m.  General Appearance: Disheveled  Eye Contact:  Minimal  Speech:  Clear and Coherent  Volume:  Decreased  Mood:  anxious  Affect:  blunted  Thought Process:  Linear and Descriptions of Associations: Intact  Orientation:  Full (Time, Place, and Person)  Thought Content:  Hallucinations: n/a  Suicidal Thoughts:  No  Homicidal Thoughts:  No  Memory:  Immediate;   Good Recent;   Good Remote;   Good  Judgement:  Poor  Insight:  Shallow  Psychomotor Activity:  Decreased  Concentration:  Concentration: Fair and Attention Span: Fair  Recall:  Poor  Fund of Knowledge:  Poor  Language:  Good  Akathisia:  No  Handed:    AIMS (if indicated):     Assets:  Physical Health  ADL's:  Intact  Cognition:  WNL  Sleep:  Number of Hours: 8.25     Treatment Plan Summary:  Minimal improvement since admission  Patient is a 26 year old male with history of  schizoaffective disorder. The patient has a guardian. He presents to the emergency department 1 day after being discharged to a group home from our ER.  Says his back because he dislikes his group home and is suicidal.   Schizoaffective disorder continue haloperidol 5 mg three times a day and Depakote 1500 mg by mouth daily at bedtime. Patient refused haldol injection decanoate that was offered. Check Depakote level this evening  Insomnia continue trazodone 100 mg by mouth daily at bedtime  Hypertension continue lisinopril 10 mg a day  Diabetes continue metformin 1000 mg twice a day  Dyslipidemia continue Lipitor 10 mg a day  Tobacco use disorder continue nicotine patch 21 mg a day  Diet low sodium and carbohydrate modified  Vital signs daily  Hospitalization and status continue involuntary commitment  Follow-up: Patient met with [facility] on 5/31. Patient was very happy to hear that [facility] can assist him in finding a job and connecting him with vocational rehabilitation  Disposition back to group home  Labs patient is hemoglobin A1c, lipid panel and TSH but he has been refusing blood work---will try again.  Possible d/c early next week.   Lenward Chancellor, MD 10/14/2016, 1:32 PMPatient ID: John Copeland, male   DOB: 1990-11-07, 26 y.o.   MRN: [id number] Patient ID: John Copeland, male   DOB: 07/19/1990, 26 y.o.   MRN: [id number]

## 2016-10-14 NOTE — Progress Notes (Signed)
Patient was mostly in room. Presented to the medication room and discussed his medications. Reported that he is feeling well, that the medication is working. Reported that he does not have any problem at the group home. Patient was encouraged to participate in activities. Safety maintained on the unit.

## 2016-10-14 NOTE — Plan of Care (Signed)
Problem: Coping: Goal: Ability to verbalize feelings will improve Outcome: Progressing Able to talk to staff and express thoughts and needs

## 2016-10-14 NOTE — Plan of Care (Signed)
Problem: Activity: Goal: Interest or engagement in activities will improve Outcome: Progressing Patient continues to be isolative to room the first half of shift, but around the afternoon the patient is up and will go outside with staff and peers, but is limited in his interactions with others.

## 2016-10-14 NOTE — Plan of Care (Signed)
Problem: Education: Goal: Knowledge of the prescribed therapeutic regimen will improve Outcome: Progressing Knowledgeable of medication regime upon education

## 2016-10-14 NOTE — [facility] Group Notes (Signed)
[facility] LCSW Group Therapy  10/14/2016 2:29 PM  Type of Therapy:  Group Therapy  Participation Level:  Patient did not attend group. CSW invited patient to group.   Summary of Progress/Problems: Developing Gratitude/Positive Thoughts- Group facilitator defined what gratitude is and how it relates to building healthy relationships. Patients were asked to write two journal entries on the following topics: "Five things I am grateful for" and "Having a positive attitude can change my life by". Patients shared their responses with the group and discussed how this can impact mental wellbeing. Patients developed understanding about the impact of thinking positively and how it can improve happiness and self-esteem.   John Copeland G. Garnette CzechSampson MSW, LCSWA 10/14/2016, 2:29 PM

## 2016-10-15 NOTE — Progress Notes (Signed)
  [facility] :  Will you be returning to the same living situation after discharge:  Yes,  return to group home. At discharge, do you have transportation home?: Yes,  group home will pick patient up. Do you have the ability to pay for your medications: Yes, Medicaid.  Release of information consent forms completed and in the chart;  Patient's signature needed at discharge.  Patient to Follow up at: Follow-up Information    [facility] Go on 10/17/2016.   Why:  [facility], [facility] at12:30PM. Please bring discharge paperwork to this appointment. Contact information: [address] [place] [address] [phone]        [facility] Team. Call on 10/19/2016.   Why:  Please call and check with [facility] for your intake assessment. ACT Team Lead, Helmut Musterlicia will be able to provide more information.  Contact information: Address: [address] [place], [place] [address] Ph#: [phone] Fax: [phone]          Next level of care provider has [facility]:No.  Safety Planning and Suicide Prevention discussed: Yes,  legal guardian.  Have you used any form of tobacco in the last 30 days? (Cigarettes, Smokeless Tobacco, Cigars, and/or Pipes): Yes  Has patient been referred to the Quitline?: Patient refused referral  Patient has been referred for addiction treatment: N/A  John OxfordKadijah R Ustin Copeland, MSW, LCSW-A 10/15/2016, 12:04 PM

## 2016-10-15 NOTE — [facility] Group Notes (Signed)
[facility] Group Notes:  (Nursing/MHT/Case Management/Adjunct)  Date:  10/15/2016  Time:  4:04 AM  Type of Therapy:  Group Therapy  Participation Level:  Active  Participation Quality:  Appropriate  Affect:  Appropriate  Cognitive:  Appropriate  Insight:  Appropriate  Engagement in Group:  Engaged  Modes of Intervention:  n/a  Summary of Progress/Problems:  Veva Holesshley Imani Nyanna Heideman 10/15/2016, 4:04 AM

## 2016-10-15 NOTE — Plan of Care (Signed)
Problem: Activity: Goal: Interest or engagement in leisure activities will improve Outcome: Not Progressing Isolative in room

## 2016-10-15 NOTE — Progress Notes (Signed)
Patient stayed in room but able to come to the nurses station as needed. Calm and cooperative. Reports that he does not have any problem, that the medications are working. Reports that he does not have any problem at the group home where he lives. Reporting that he is ready to go back. Was encouraged to get involved in unit activities. Support provided and therapeutic milieu promoted. Patient currently in bed sleeping. Remains safe in room.

## 2016-10-15 NOTE — Progress Notes (Signed)
Provided and reviewed discharge paperwork and prescriptions. Verified understanding by use of teach back method. Verbalizes understanding as well. Denies SI/HI/AVH, pain. Belongings to be returned as noted once discharge. Per SW, pt's group home will pick him up between [phone] today. Safety maintained. Will continue to monitor.

## 2016-10-15 NOTE — Tx Team (Signed)
Interdisciplinary Treatment and Diagnostic Plan Update  10/15/2016 Time of Session: 1100 AM John SellsJoshua Copeland MRN: [id number]  Principal Diagnosis: Schizoaffective disorder, bipolar type (HCC)  Secondary Diagnoses: Principal Problem:   Schizoaffective disorder, bipolar type (HCC) Active Problems:   Tobacco use disorder   Diabetes (HCC)   HTN (hypertension)   Dyslipidemia   Current Medications:  Current Facility-Administered Medications  Medication Dose Route Frequency Provider Last Rate Last Dose  . acetaminophen (TYLENOL) tablet 650 mg  650 mg Oral Q6H PRN Pucilowska, Jolanta B, MD   650 mg at 10/12/16 1950  . alum & mag hydroxide-simeth (MAALOX/MYLANTA) 200-200-20 MG/5ML suspension 30 mL  30 mL Oral Q4H PRN Pucilowska, Jolanta B, MD      . atorvastatin (LIPITOR) tablet 10 mg  10 mg Oral q1800 Pucilowska, Jolanta B, MD   10 mg at 10/14/16 1701  . diphenhydrAMINE (BENADRYL) capsule 50 mg  50 mg Oral QHS Jimmy FootmanHernandez-Gonzalez, Andrea, MD   50 mg at 10/14/16 2127  . divalproex (DEPAKOTE ER) 24 hr tablet 1,500 mg  1,500 mg Oral QHS Pucilowska, Jolanta B, MD   1,500 mg at 10/14/16 2127  . haloperidol (HALDOL) tablet 5 mg  5 mg Oral TID Jimmy FootmanHernandez-Gonzalez, Andrea, MD   5 mg at 10/15/16 1142  . lisinopril (PRINIVIL,ZESTRIL) tablet 10 mg  10 mg Oral Daily Pucilowska, Jolanta B, MD   10 mg at 10/15/16 0807  . LORazepam (ATIVAN) tablet 2 mg  2 mg Oral TID PRN Jimmy FootmanHernandez-Gonzalez, Andrea, MD   2 mg at 10/08/16 2016  . magnesium hydroxide (MILK OF MAGNESIA) suspension 30 mL  30 mL Oral Daily PRN Pucilowska, Jolanta B, MD      . metFORMIN (GLUCOPHAGE) tablet 1,000 mg  1,000 mg Oral BID WC Pucilowska, Jolanta B, MD   1,000 mg at 10/15/16 0807  . nicotine (NICODERM CQ - dosed in mg/24 hours) patch 21 mg  21 mg Transdermal Q0600 Pucilowska, Jolanta B, MD      . traZODone (DESYREL) tablet 100 mg  100 mg Oral QHS Pucilowska, Jolanta B, MD   100 mg at 10/14/16 2127   PTA Medications: Prescriptions Prior to  Admission  Medication Sig Dispense Refill Last Dose  . atorvastatin (LIPITOR) 10 MG tablet Take 1 tablet (10 mg total) by mouth daily at 6 PM. 7 tablet 0   . divalproex (DEPAKOTE ER) 500 MG 24 hr tablet Take 3 tablets (1,500 mg total) by mouth at bedtime. 21 tablet 0   . haloperidol (HALDOL) 5 MG tablet Take 1 tablet (5 mg total) by mouth 2 (two) times daily. 14 tablet 0   . lisinopril (PRINIVIL,ZESTRIL) 10 MG tablet Take 1 tablet (10 mg total) by mouth daily. 7 tablet 0   . metFORMIN (GLUCOPHAGE) 1000 MG tablet Take 1 tablet (1,000 mg total) by mouth 2 (two) times daily with a meal. 14 tablet 0   . Multiple Vitamin (THEREMS PO) Take 1 tablet by mouth daily.   10/02/2016 at 0800  . Vitamin D, Ergocalciferol, (DRISDOL) 50000 units CAPS capsule Take 50,000 Units by mouth every 7 (seven) days. Sunday   09/30/2016 at 0800    Patient Stressors: Educational concerns Occupational concerns  Patient Strengths: Manufacturing systems engineerCommunication skills Physical Health  Treatment Modalities: Medication Management, Group therapy, Case management,  1 to 1 session with clinician, Psychoeducation, Recreational therapy.   Physician Treatment Plan for Primary Diagnosis: Schizoaffective disorder, bipolar type (HCC) Long Term Goal(s): Improvement in symptoms so as ready for discharge Improvement in symptoms so as ready for discharge  Short Term Goals: Ability to identify changes in lifestyle to reduce recurrence of condition will improve Ability to demonstrate self-control will improve Ability to identify triggers associated with substance abuse/mental health issues will improve Ability to identify and develop effective coping behaviors will improve  Medication Management: Evaluate patient's response, side effects, and tolerance of medication regimen.  Therapeutic Interventions: 1 to 1 sessions, Unit Group sessions and Medication administration.  Evaluation of Outcomes: Adequate for discharge   Physician Treatment Plan  for Secondary Diagnosis: Principal Problem:   Schizoaffective disorder, bipolar type (HCC) Active Problems:   Tobacco use disorder   Diabetes (HCC)   HTN (hypertension)   Dyslipidemia  Long Term Goal(s): Improvement in symptoms so as ready for discharge Improvement in symptoms so as ready for discharge   Short Term Goals: Ability to identify changes in lifestyle to reduce recurrence of condition will improve Ability to demonstrate self-control will improve Ability to identify triggers associated with substance abuse/mental health issues will improve Ability to identify and develop effective coping behaviors will improve     Medication Management: Evaluate patient's response, side effects, and tolerance of medication regimen.  Therapeutic Interventions: 1 to 1 sessions, Unit Group sessions and Medication administration.  Evaluation of Outcomes: Adequate for discharge    RN Treatment Plan for Primary Diagnosis: Schizoaffective disorder, bipolar type (HCC) Long Term Goal(s): Knowledge of disease and therapeutic regimen to maintain health will improve  Short Term Goals: Ability to verbalize feelings will improve, Ability to disclose and discuss suicidal ideas, Ability to identify and develop effective coping behaviors will improve and Compliance with prescribed medications will improve  Medication Management: RN will administer medications as ordered by provider, will assess and evaluate patient's response and provide education to patient for prescribed medication. RN will report any adverse and/or side effects to prescribing provider.  Therapeutic Interventions: 1 on 1 counseling sessions, Psychoeducation, Medication administration, Evaluate responses to treatment, Monitor vital signs and CBGs as ordered, Perform/monitor CIWA, COWS, AIMS and Fall Risk screenings as ordered, Perform wound care treatments as ordered.  Evaluation of Outcomes: Adequate for discharge    LCSW Treatment Plan  for Primary Diagnosis: Schizoaffective disorder, bipolar type (HCC) Long Term Goal(s): Safe transition to appropriate next level of care at discharge, Engage patient in therapeutic group addressing interpersonal concerns.  Short Term Goals: Engage patient in aftercare planning with referrals and resources, Increase social support and Increase skills for wellness and recovery  Therapeutic Interventions: Assess for all discharge needs, 1 to 1 time with Social worker, Explore available resources and support systems, Assess for adequacy in community support network, Educate family and significant other(s) on suicide prevention, Complete Psychosocial Assessment, Interpersonal group therapy.  Evaluation of Outcomes: Adequate for discharge   Progress in Treatment: Attending groups: No. Participating in groups: No. Taking medication as prescribed: Yes. Toleration medication: Yes. Family/Significant other contact made: Yes, individual(s) contacted:  guardian, Durwin Nora Patient understands diagnosis: Yes. Discussing patient identified problems/goals with staff: Yes. Medical problems stabilized or resolved: Yes. Denies suicidal/homicidal ideation: Yes. Issues/concerns per patient self-inventory: No. Other: none  New problem(s) identified: No, Describe:  none  New Short Term/Long Term Goal(s): Pt goal: "I want a different place to live in a different town."  Discharge Plan or Barriers: Pt will discharge to group home and follow-up with [facility] as he awaits assessment from [facility] ACT Team.  Reason for Continuation of Hospitalization: Depression Medication stabilization  Estimated Length of Stay: D/C 10/15/2016  Attendees: Patient: John Copeland 10/10/2016  Physician: Dr. Ardyth Harps, MD 10/10/2016   Nursing:  10/10/2016   RN Care Manager: 10/10/2016   Social Worker: Hampton Abbot, MSW, LCSW-A 10/10/2016   Recreational Therapist: Princella Ion, LRT, CTRS  10/10/2016   Other:   10/10/2016   Other:  10/10/2016   Other: 10/10/2016            Scribe for Treatment Team: Lynden Oxford, LCSWA 10/15/2016 2:04 PM

## 2016-10-15 NOTE — [facility] Group Notes (Addendum)
[facility] LCSW Group Therapy   10/15/2016 1:00 pm Type of Therapy: Group Therapy   Participation Level: Patient invited but did not attend.    Hampton AbbotKadijah Love Milbourne, MSW, LCSW-A 10/15/2016, 2:34PM

## 2016-10-15 NOTE — Progress Notes (Signed)
Recreation Therapy Notes  Date: 06.04.18 Time: 9:30 am Location: Craft Room  Group Topic: Self-expression  Goal Area(s) Addresses:  Patient will be able to identify a color that represents each emotion. Patient will verbalize benefit of using art as a means of self-expression. Patient will verbalize one emotion experienced while participating in activity.  Behavioral Response: Did not attend  Intervention: The Colors Within Me  Activity: Patients were given a blank face worksheet and were instructed to pick a color for each emotion they were feeling and show on the worksheet how much of that emotion they were feeling.  Education: LRT educated patients on other forms of self-expression.  Education Outcome: Patient did not attend group.   Clinical Observations/Feedback: Patient did not attend group.  Jacquelynn CreeGreene,Kenasia Scheller M, LRT/CTRS 10/15/2016 10:13 AM

## 2016-10-15 NOTE — Plan of Care (Signed)
Problem: Health Behavior/Discharge Planning: Goal: Compliance with treatment plan for underlying cause of condition will improve Outcome: Not Progressing Taking medications but not attending groups

## 2016-10-15 NOTE — Plan of Care (Signed)
Problem: Education: Goal: Utilization of techniques to improve thought processes will improve Outcome: Not Progressing Guarded, avoiding to discuss his condition

## 2016-10-15 NOTE — Plan of Care (Signed)
Problem: Coping: Goal: Ability to cope will improve Outcome: Not Progressing Pt isolative in room

## 2016-10-18 ENCOUNTER — Encounter: Payer: Self-pay | Admitting: *Deleted

## 2016-10-18 ENCOUNTER — Emergency Department
Admission: EM | Admit: 2016-10-18 | Discharge: 2016-10-18 | Disposition: A | Discharge status: Home / Self Care | Payer: Medicaid Other | Attending: Emergency Medicine | Admitting: Emergency Medicine

## 2016-10-18 DIAGNOSIS — F17210 Nicotine dependence, cigarettes, uncomplicated: Secondary | ICD-10-CM | POA: Diagnosis not present

## 2016-10-18 DIAGNOSIS — F259 Schizoaffective disorder, unspecified: Secondary | ICD-10-CM | POA: Diagnosis present

## 2016-10-18 DIAGNOSIS — Z79899 Other long term (current) drug therapy: Secondary | ICD-10-CM | POA: Diagnosis not present

## 2016-10-18 DIAGNOSIS — Z7984 Long term (current) use of oral hypoglycemic drugs: Secondary | ICD-10-CM | POA: Diagnosis not present

## 2016-10-18 DIAGNOSIS — E119 Type 2 diabetes mellitus without complications: Secondary | ICD-10-CM | POA: Diagnosis not present

## 2016-10-18 DIAGNOSIS — Z711 Person with feared health complaint in whom no diagnosis is made: Secondary | ICD-10-CM

## 2016-10-18 DIAGNOSIS — F329 Major depressive disorder, single episode, unspecified: Secondary | ICD-10-CM | POA: Insufficient documentation

## 2016-10-18 DIAGNOSIS — I10 Essential (primary) hypertension: Secondary | ICD-10-CM | POA: Diagnosis not present

## 2016-10-18 NOTE — Discharge Instructions (Signed)
Please follow-up with your psychiatrist and your primary care physician as needed and return to the emergency department for any concerns.

## 2016-10-18 NOTE — ED Notes (Signed)
Pt asking for food immediatly upon arrival to ED.

## 2016-10-18 NOTE — ED Notes (Signed)
Caretaker dropped him [place] the [place]. Pt is from a group home stating wants a different one.

## 2016-10-18 NOTE — ED Provider Notes (Signed)
[facility] Emergency Department Provider Note  ____________________________________________   First MD Initiated Contact with Patient 10/18/16 2136     (approximate)  I have reviewed the triage vital signs and the nursing notes.   HISTORY  Chief Complaint Other    HPI John Copeland is a 26 y.o. male who comes to the emergency department requesting a new group home. He has a history of schizoaffective disorder reports compliance with medications. He said he does not like where he is currently living and has no medical complaints he just wants help finding a new place to live.   Past Medical History:  Diagnosis Date  . Depression   . Diabetes mellitus without complication [facility])     Patient Active Problem List   Diagnosis Date Noted  . Tobacco use disorder 10/07/2016  . Diabetes (HCC) 10/07/2016  . HTN (hypertension) 10/07/2016  . Dyslipidemia 10/07/2016  . Schizoaffective disorder, bipolar type (HCC) 11/30/2015    History reviewed. No pertinent surgical history.  Prior to Admission medications   Medication Sig Start Date End Date Taking? Authorizing Provider  atorvastatin (LIPITOR) 10 MG tablet Take 1 tablet (10 mg total) by mouth daily at 6 PM. 10/05/16   Clapacs, Jackquline Denmark, MD  diphenhydrAMINE (BENADRYL) 50 MG capsule Take 1 capsule (50 mg total) by mouth at bedtime. 10/12/16   Jimmy Footman, MD  divalproex (DEPAKOTE ER) 500 MG 24 hr tablet Take 3 tablets (1,500 mg total) by mouth at bedtime. 10/09/16   Jimmy Footman, MD  haloperidol (HALDOL) 5 MG tablet Take 1 tablet (5 mg total) by mouth 3 (three) times daily. 10/12/16   Jimmy Footman, MD  lisinopril (PRINIVIL,ZESTRIL) 10 MG tablet Take 1 tablet (10 mg total) by mouth daily. 10/06/16   Clapacs, Jackquline Denmark, MD  metFORMIN (GLUCOPHAGE) 1000 MG tablet Take 1 tablet (1,000 mg total) by mouth 2 (two) times daily with a meal. 10/05/16   Clapacs, Jackquline Denmark, MD  traZODone  (DESYREL) 100 MG tablet Take 1 tablet (100 mg total) by mouth at bedtime. 10/09/16   Jimmy Footman, MD    Allergies Patient has no known allergies.  History reviewed. No pertinent family history.  Social History Social History  Substance Use Topics  . Smoking status: Current Every Day Smoker    Packs/day: 0.50    Types: Cigarettes  . Smokeless tobacco: Never Used  . Alcohol use No    Review of Systems Constitutional: No fever/chills ENT: No sore throat. Cardiovascular: Denies chest pain. Respiratory: Denies shortness of breath. Gastrointestinal: No abdominal pain.  No nausea, no vomiting.  No diarrhea.  No constipation. Musculoskeletal: Negative for back pain. Neurological: Negative for headaches   ____________________________________________   PHYSICAL EXAM:  VITAL SIGNS: ED Triage Vitals  Enc Vitals Group     BP 10/18/16 2114 (!) 166/92     Pulse Rate 10/18/16 2114 83     Resp 10/18/16 2114 16     Temp 10/18/16 2114 98.2 F (36.8 C)     Temp Source 10/18/16 2114 Oral     SpO2 10/18/16 2114 99 %     Weight 10/18/16 2113 210 lb (95.3 kg)     Height 10/18/16 2113 6' (1.829 m)     Head Circumference --      Peak Flow --      Pain Score --      Pain Loc --      Pain Edu? --      Excl. in GC? --  Constitutional: Alert and oriented x 4 well appearing nontoxic no diaphoresis speaks in full, clear sentences Head: Atraumatic. Nose: No congestion/rhinnorhea. Mouth/Throat: No trismus Neck: No stridor.   Cardiovascular: Regular rate and rhythm Respiratory: Normal respiratory effort.  No retractions. Gastrointestinal: Soft nontender Neurologic: No gross focal neurologic deficits are appreciated.  Skin:  Skin is warm, dry and intact. No rash noted.    ____________________________________________  LABS (all labs ordered are listed, but only abnormal results are displayed)  Labs Reviewed - No data to  display   __________________________________________  EKG   ____________________________________________  RADIOLOGY   ____________________________________________   PROCEDURES  Procedure(s) performed: no  Procedures  Critical Care performed: no  Observation: no ____________________________________________   INITIAL IMPRESSION / ASSESSMENT AND PLAN / ED COURSE  Pertinent labs & imaging results that were available during my care of the patient were reviewed by me and considered in my medical decision making (see chart for details).  The patient has no acute medical issues and merely wants help finding a new place to stay. I discussed with the patient at length that as he has no medical complaints he will be unable to stay in our emergency department tonight. We will provide him taxi voucher to the homeless shelter.      ____________________________________________   FINAL CLINICAL IMPRESSION(S) / ED DIAGNOSES  Final diagnoses:  Feared condition not demonstrated      NEW MEDICATIONS STARTED DURING THIS VISIT:  New Prescriptions   No medications on file     Note:  This document was prepared using Dragon voice recognition software and may include unintentional dictation errors.      Merrily Brittleifenbark, Shavone Nevers, MD 10/18/16 [phone]

## 2016-10-18 NOTE — BH Assessment (Signed)
[facility] [phone]).

## 2016-10-18 NOTE — ED Notes (Signed)
Pt reports does not want to live at current group home, wants to be placed in a group home close to the city. Pt denies SI/HI. States they do not feed him but only 3 meals a day and he "goes hungry." Pt alert, calm and cooperative. TTS speaking to patient.
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
## 2016-10-25 ENCOUNTER — Telehealth: Payer: Self-pay

## 2016-10-25 NOTE — Telephone Encounter (Signed)
PATIENT HAS A LEGAL GUARDIAN WHO MUST BE CONTACTED WHEN PATIENT ARRIVES TO THE EMERGENCY DEPARTMENT. LEGAL GUARDIAN- [place]/ [place] [phone]

## 2016-10-26 NOTE — Clinical Social Work Note (Signed)
CSW contacted by [name] [facility] requesting the same information that he requested of CSW he spoke with yesterday. CSW informed him that our department was not here at the time of patient's discharge and that information I would have would be limited. CSW offered to connect him with a representative of the hospital that might could assist him further. CSW contacted Vernona RiegerLaura, the [facility], and informed her of  Marty's phone call to this CSW. York SpanielMonica Shakena Callari MSW,LCSW [phone]

## 2016-10-30 ENCOUNTER — Encounter (HOSPITAL_COMMUNITY): Payer: Self-pay | Admitting: Emergency Medicine

## 2016-10-30 ENCOUNTER — Emergency Department (HOSPITAL_COMMUNITY)
Admission: EM | Admit: 2016-10-30 | Discharge: 2016-11-01 | Disposition: A | Discharge status: Other Acute Inpatient Hospital | Payer: Medicaid Other | Attending: Emergency Medicine | Admitting: Emergency Medicine

## 2016-10-30 DIAGNOSIS — I10 Essential (primary) hypertension: Secondary | ICD-10-CM | POA: Insufficient documentation

## 2016-10-30 DIAGNOSIS — Z046 Encounter for general psychiatric examination, requested by authority: Secondary | ICD-10-CM

## 2016-10-30 DIAGNOSIS — Z79899 Other long term (current) drug therapy: Secondary | ICD-10-CM | POA: Diagnosis not present

## 2016-10-30 DIAGNOSIS — E119 Type 2 diabetes mellitus without complications: Secondary | ICD-10-CM | POA: Insufficient documentation

## 2016-10-30 DIAGNOSIS — Z008 Encounter for other general examination: Secondary | ICD-10-CM | POA: Insufficient documentation

## 2016-10-30 DIAGNOSIS — F250 Schizoaffective disorder, bipolar type: Secondary | ICD-10-CM | POA: Diagnosis present

## 2016-10-30 DIAGNOSIS — D509 Iron deficiency anemia, unspecified: Secondary | ICD-10-CM | POA: Diagnosis not present

## 2016-10-30 DIAGNOSIS — Z9114 Patient's other noncompliance with medication regimen: Secondary | ICD-10-CM

## 2016-10-30 DIAGNOSIS — F17210 Nicotine dependence, cigarettes, uncomplicated: Secondary | ICD-10-CM | POA: Diagnosis not present

## 2016-10-30 DIAGNOSIS — R45851 Suicidal ideations: Secondary | ICD-10-CM | POA: Diagnosis not present

## 2016-10-30 DIAGNOSIS — T383X6A Underdosing of insulin and oral hypoglycemic [antidiabetic] drugs, initial encounter: Secondary | ICD-10-CM | POA: Insufficient documentation

## 2016-10-30 DIAGNOSIS — G4700 Insomnia, unspecified: Secondary | ICD-10-CM | POA: Diagnosis not present

## 2016-10-30 DIAGNOSIS — Z7984 Long term (current) use of oral hypoglycemic drugs: Secondary | ICD-10-CM | POA: Diagnosis not present

## 2016-10-30 DIAGNOSIS — Z91128 Patient's intentional underdosing of medication regimen for other reason: Secondary | ICD-10-CM | POA: Diagnosis present

## 2016-10-30 DIAGNOSIS — E1121 Type 2 diabetes mellitus with diabetic nephropathy: Secondary | ICD-10-CM | POA: Diagnosis not present

## 2016-10-30 HISTORY — DX: Schizophrenia, unspecified: F20.9

## 2016-10-30 HISTORY — DX: Anxiety disorder, unspecified: F41.9

## 2016-10-30 LAB — COMPREHENSIVE METABOLIC PANEL
ALBUMIN: 4.1 g/dL (ref 3.5–5.0)
ALT: 47 U/L (ref 17–63)
AST: 37 U/L (ref 15–41)
Alkaline Phosphatase: 46 U/L (ref 38–126)
Anion gap: 9 (ref 5–15)
BUN: 9 mg/dL (ref 6–20)
CHLORIDE: 107 mmol/L (ref 101–111)
CO2: 22 mmol/L (ref 22–32)
CREATININE: 0.86 mg/dL (ref 0.61–1.24)
Calcium: 9.2 mg/dL (ref 8.9–10.3)
GFR calc Af Amer: >60 mL/min (ref >60)
GLUCOSE: 112 mg/dL — AB (ref 65–99)
POTASSIUM: 4.1 mmol/L (ref 3.5–5.1)
Sodium: 138 mmol/L (ref 135–145)
Total Bilirubin: 0.4 mg/dL (ref 0.3–1.2)
Total Protein: 7 g/dL (ref 6.5–8.1)

## 2016-10-30 LAB — CBC
HCT: 37.4 % — ABNORMAL LOW (ref 39.0–52.0)
Hemoglobin: 12.5 g/dL — ABNORMAL LOW (ref 13.0–17.0)
MCH: 24.1 pg — AB (ref 26.0–34.0)
MCHC: 33.4 g/dL (ref 30.0–36.0)
MCV: 72.2 fL — AB (ref 78.0–100.0)
PLATELETS: 260 10*3/uL (ref 150–400)
RBC: 5.18 MIL/uL (ref 4.22–5.81)
RDW: 15.9 % — ABNORMAL HIGH (ref 11.5–15.5)
WBC: 7.3 10*3/uL (ref 4.0–10.5)

## 2016-10-30 LAB — ETHANOL: Alcohol, Ethyl (B): <5 mg/dL (ref <5)

## 2016-10-30 LAB — SALICYLATE LEVEL: Salicylate Lvl: <7 mg/dL (ref 2.8–30.0)

## 2016-10-30 LAB — RAPID URINE DRUG SCREEN, HOSP PERFORMED
Amphetamines: NOT DETECTED
BARBITURATES: NOT DETECTED
BENZODIAZEPINES: NOT DETECTED
Cocaine: NOT DETECTED
Opiates: NOT DETECTED
Tetrahydrocannabinol: NOT DETECTED

## 2016-10-30 LAB — CBG MONITORING, ED: GLUCOSE-CAPILLARY: 98 mg/dL (ref 65–99)

## 2016-10-30 LAB — ACETAMINOPHEN LEVEL: Acetaminophen (Tylenol), Serum: <10 ug/mL — ABNORMAL LOW (ref 10–30)

## 2016-10-30 MED ORDER — DIPHENHYDRAMINE HCL 25 MG PO CAPS
50.0000 mg | ORAL_CAPSULE | Freq: Every day | ORAL | Status: DC
  Administered 2016-10-31: 50 mg via ORAL
  Filled 2016-10-30 (×2): qty 2

## 2016-10-30 MED ORDER — HALOPERIDOL LACTATE 5 MG/ML IJ SOLN: 2.0000 mg | Freq: Four times a day (QID) | INTRAMUSCULAR | Status: DC | PRN

## 2016-10-30 MED ORDER — LISINOPRIL 10 MG PO TABS
10.0000 mg | ORAL_TABLET | Freq: Every day | ORAL | Status: DC
  Administered 2016-10-31 – 2016-11-01 (×2): 10 mg via ORAL
  Filled 2016-10-30 (×3): qty 1

## 2016-10-30 MED ORDER — LORAZEPAM 2 MG/ML IJ SOLN
2.0000 mg | Freq: Once | INTRAMUSCULAR | Status: AC
  Administered 2016-10-30: 2 mg via INTRAMUSCULAR
  Filled 2016-10-30: qty 1

## 2016-10-30 MED ORDER — HALOPERIDOL 5 MG PO TABS
5.0000 mg | ORAL_TABLET | Freq: Three times a day (TID) | ORAL | Status: DC
  Administered 2016-10-31 – 2016-11-01 (×4): 5 mg via ORAL
  Filled 2016-10-30 (×5): qty 1

## 2016-10-30 MED ORDER — ALUM & MAG HYDROXIDE-SIMETH 200-200-20 MG/5ML PO SUSP: 30.0000 mL | Freq: Four times a day (QID) | ORAL | Status: DC | PRN

## 2016-10-30 MED ORDER — ONDANSETRON HCL 4 MG PO TABS: 4.0000 mg | ORAL_TABLET | Freq: Three times a day (TID) | ORAL | Status: DC | PRN

## 2016-10-30 MED ORDER — IBUPROFEN 200 MG PO TABS: 600.0000 mg | ORAL_TABLET | Freq: Three times a day (TID) | ORAL | Status: DC | PRN

## 2016-10-30 MED ORDER — TRAZODONE HCL 100 MG PO TABS
100.0000 mg | ORAL_TABLET | Freq: Every day | ORAL | Status: DC
  Administered 2016-10-31: 100 mg via ORAL
  Filled 2016-10-30 (×2): qty 1

## 2016-10-30 MED ORDER — ATORVASTATIN CALCIUM 10 MG PO TABS
10.0000 mg | ORAL_TABLET | Freq: Every day | ORAL | Status: DC
  Administered 2016-10-31: 10 mg via ORAL
  Filled 2016-10-30 (×2): qty 1

## 2016-10-30 MED ORDER — FERROUS SULFATE 325 (65 FE) MG PO TABS
325.0000 mg | ORAL_TABLET | Freq: Every day | ORAL | Status: DC
  Administered 2016-11-01: 325 mg via ORAL
  Filled 2016-10-30 (×2): qty 1

## 2016-10-30 MED ORDER — HALOPERIDOL LACTATE 5 MG/ML IJ SOLN
5.0000 mg | INTRAMUSCULAR | Status: AC
  Administered 2016-10-30: 5 mg via INTRAMUSCULAR
  Filled 2016-10-30: qty 1

## 2016-10-30 MED ORDER — ACETAMINOPHEN 325 MG PO TABS: 650.0000 mg | ORAL_TABLET | ORAL | Status: DC | PRN

## 2016-10-30 MED ORDER — DIVALPROEX SODIUM ER 500 MG PO TB24
1500.0000 mg | ORAL_TABLET | Freq: Every day | ORAL | Status: DC
  Administered 2016-10-31: 1500 mg via ORAL
  Filled 2016-10-30: qty 3

## 2016-10-30 MED ORDER — METFORMIN HCL 500 MG PO TABS
1000.0000 mg | ORAL_TABLET | Freq: Two times a day (BID) | ORAL | Status: DC
Start: 2016-10-31 — End: 2016-11-01
  Administered 2016-10-31 – 2016-11-01 (×3): 1000 mg via ORAL
  Filled 2016-10-30 (×3): qty 2

## 2016-10-30 NOTE — ED Notes (Signed)
Patient continued to scream and make multiple threats and make derogatory statements towards staff members. Patient unable to be redirected. Provider aware; new orders received. Security on unit to assist in medication administration. Patient given injections without incident.

## 2016-10-30 NOTE — ED Notes (Signed)
Bed: WA27 Expected date:  Expected time:  Means of arrival:  Comments: TRI4

## 2016-10-30 NOTE — ED Notes (Signed)
Patient resting in bed watching tv. Pt cooperative with care and denies suicidal/homicidal ideations at this time. No signs of distress noted. Patient provided with specimen cup for a urine test; pt verbalizes an understanding of instructions for collection.

## 2016-10-30 NOTE — BH Assessment (Signed)
Tele Assessment Note   John Copeland is an 26 y.o. male, African American, Single who [facility] per ED report: PMHx of schizophrenia, DM2, depression, anxiety, and HLD, who presents to the ED via [facility] under IVC. Per IVC paperwork "respondent has been diagnosed schizophrenia, depression, and anxiety. Respondent has been missing from a group home since June 7 without his meds. He is currently in a life-threatening situation since he does not have his diabetes medication. [place] his guardian, respondent has been seen at a homeless shelter signing his own paperwork. The last time the shelter staff saw him respondent was easily agitated and hyper religious. Given his mental health status and the need to have a guardian, as well as his lack of meds, respondent may be a danger to himself." Pt states he ran away from his group home because he does not like it, has been living in [facility] and admits that he hasn't been taking his medications in 2 weeks because he doesn't have them. He doesn't like Depakote or Haldol because "they keep him from achieving his goals" and cause him to gain weight and be sleepy. He denies SI, HI, AVH.  Patient states primary concern is he was seeking help and trying obtain [facility] and had appointment in coming Monday. Patient was not very forthcoming and with flat affect with appearance of some confusion. Patient states he resides with [facility] currently, and had left his group home, but states is his own guardian. However confirmed, per IVC and per [facility] does have a guardian which is also [facility].  Patient states has had same sleep amounts with at least 8 hours per night, and acknowledges non-med compliance.    Patient denies current SI/HI and AVH. Patient denies hx. Of S.A. Patient has been seen inpatient for psych care [facility]. Patient states is  not currently seen outpatient for psych care.   Patient is dressed in scrubs and is alert and oriented x4. Patient speech was within normal limits and motor behavior appeared normal. Patient thought process is coherent. Patient  does not appear to be responding to internal stimuli. Patient was cooperative throughout the assessment and states that he  is agreeable to inpatient psychiatric treatment.   Diagnosis: Schizophrenia  Past Medical History:  Past Medical History:  Diagnosis Date  . Anxiety   . Depression   . Diabetes mellitus without complication (HCC)   . Schizophrenia (HCC)     History reviewed. No pertinent surgical history.  Family History: No family history on file.  Social History:  reports that he has been smoking Cigarettes.  He has been smoking about 0.50 packs per day. He has never used smokeless tobacco. He reports that he does not drink alcohol or use drugs.  Additional Social History:  Alcohol / Drug Use Pain Medications: SEE MAR Prescriptions: SEE MAR Over the Counter: SEE MAR History of alcohol / drug use?: No history of alcohol / drug abuse  CIWA: CIWA-Ar BP: (!) 166/118 Pulse Rate: (!) 102 COWS:    PATIENT STRENGTHS: (choose at least two) Ability for insight Communication skills  Allergies: No Known Allergies  Home Medications:  (Not in a hospital admission)  OB/GYN Status:  No LMP for male patient.  General Assessment Data Location of Assessment: [facility] TTS Assessment: In system Is this a Tele or Face-to-Face Assessment?: Face-to-Face Is this an Initial Assessment  or a Re-assessment for this encounter?: Initial Assessment Marital status: Single Maiden name: n/a Is patient pregnant?: No Pregnancy Status: No Living Arrangements: Group Home Can pt return to current living arrangement?: Yes Admission Status: Involuntary Is patient capable of signing voluntary admission?: No (pt has guardian) Referral Source: Other Insurance type:  Medicaid     Crisis Care Plan Living Arrangements: Group Home Legal Guardian: Other: Name of Psychiatrist: none Name of Therapist: none  Education Status Is patient currently in school?: No Current Grade: n/a Highest grade of school patient has completed: GED Name of school: n/a Contact person: guardian and/or DSS [place]  Risk to self with the past 6 months Suicidal Ideation: No Has patient been a risk to self within the past 6 months prior to admission? : Yes Suicidal Intent: No Has patient had any suicidal intent within the past 6 months prior to admission? : No Is patient at risk for suicide?: No Suicidal Plan?: No Has patient had any suicidal plan within the past 6 months prior to admission? : No Access to Means: No What has been your use of drugs/alcohol within the last 12 months?: none Previous Attempts/Gestures: No How many times?: 0 Other Self Harm Risks: none Triggers for Past Attempts: None known Intentional Self Injurious Behavior: None Family Suicide History: Unknown Recent stressful life event(s): Trauma (Comment) Persecutory voices/beliefs?: No Depression: No Substance abuse history and/or treatment for substance abuse?: No Suicide prevention information given to non-admitted patients: Not applicable  Risk to Others within the past 6 months Homicidal Ideation: No Does patient have any lifetime risk of violence toward others beyond the six months prior to admission? : No Thoughts of Harm to Others: No Current Homicidal Intent: No Current Homicidal Plan: No Access to Homicidal Means: No Identified Victim: none History of harm to others?: No Assessment of Violence: None Noted Violent Behavior Description: n/a Does patient have access to weapons?: No Criminal Charges Pending?: No Does patient have a court date: No Is patient on probation?: No  Psychosis Hallucinations: None noted Delusions: None noted  Mental Status  Report Appearance/Hygiene: In scrubs Eye Contact: Poor Motor Activity: Freedom of movement Speech: Logical/coherent Level of Consciousness: Alert Mood: Pleasant Affect: Flat Anxiety Level: Moderate Thought Processes: Relevant Judgement: Partial Orientation: Person, Place, Time, Situation, Appropriate for developmental age Obsessive Compulsive Thoughts/Behaviors: Moderate  Cognitive Functioning Concentration: Decreased Memory: Recent Intact, Remote Intact IQ: Average Insight: Fair Impulse Control: Poor Appetite: Fair Weight Loss: 0 Weight Gain: 0 Sleep: No Change Total Hours of Sleep: 8 Vegetative Symptoms: None  ADLScreening [facility] Assessment Services) Patient's cognitive ability adequate to safely complete daily activities?: Yes Patient able to express need for assistance with ADLs?: Yes Independently performs ADLs?: Yes (appropriate for developmental age)  Prior Inpatient Therapy Prior Inpatient Therapy: Yes Prior Therapy Dates: 2018 Prior Therapy Facilty/Provider(s): [facility] Reason for Treatment: Schizophrenia  Prior Outpatient Therapy Prior Outpatient Therapy: No Prior Therapy Dates: n/a Prior Therapy Facilty/Provider(s): n/a Reason for Treatment: n/a Does patient have an ACCT team?: Unknown Does patient have Intensive In-House Services?  : Unknown Does patient have [facility] services? : Unknown Does patient have P4CC services?: Unknown  ADL Screening (condition at time of admission) Patient's cognitive ability adequate to safely complete daily activities?: Yes Is the patient deaf or have difficulty hearing?: No Does the patient have difficulty seeing, even when wearing glasses/contacts?: No Does the patient have difficulty concentrating, remembering, or making decisions?: No Patient able to express need for assistance with ADLs?: Yes Does the patient  have difficulty dressing or bathing?: No Independently performs ADLs?: Yes (appropriate for developmental  age) Does the patient have difficulty walking or climbing stairs?: No Weakness of Legs: None Weakness of Arms/Hands: None       Abuse/Neglect Assessment (Assessment to be complete while patient is alone) Physical Abuse: Denies Verbal Abuse: Denies Sexual Abuse: Denies Exploitation of patient/patient's resources: Denies Self-Neglect: Denies Values / Beliefs Cultural Requests During Hospitalization: None Spiritual Requests During Hospitalization: None   Advance Directives (For Healthcare) Does Patient Have a Medical Advance Directive?: No (per MAR, pt has guardian)    Additional Information 1:1 In Past 12 Months?: No CIRT Risk: No Elopement Risk: No Does patient have medical clearance?: Yes     Disposition: Per Donell Sievert, NP meets inpatient criteria Disposition Initial Assessment Completed for this Encounter: Yes Disposition of Patient: Other dispositions (TBD)  Hipolito Bayley 10/30/2016 9:23 PM

## 2016-10-30 NOTE — ED Triage Notes (Signed)
Patient escorted by [facility]. Pt IVCd. Papers read "Respondent has been diagnosed with schizophrenia, depression, and anxiety. Respondent has been missing from group home since June 7th without his meds. He is currently in a  life threatening situation since he does not have his diabetes medication. [place] his guardian. Respondent has been seen at a homeless shelter signing his own paperwork. The last time the shelter staff saw him respondent was easily agitated and hyper religious. Given his mental health status and a need to have guardian, as well as his lack of meds, respondent may be a danger to himself."

## 2016-10-30 NOTE — ED Notes (Signed)
John Copeland, patient guardian called and asks that the psychiatrist contact him in the am if patient to be admitted or released. He can be reached at [phone].

## 2016-10-30 NOTE — ED Notes (Signed)
Patient exited his room and began to yell obscenities at staff members and demanding more food. Patient at this time had already eaten a full sandwich, several snacks and two diet sodas. Patient informed that he had been given evening meal and unit rules reviewed with him. Patient began getting louder and then threatening towards staff members. Dr. Ethelda ChickJacubowitz in room to assess patient. New orders obtained.

## 2016-10-30 NOTE — ED Provider Notes (Signed)
I was called to room as patient was coming out of his room, threatening staff. Refusing to take oral medicines. IM Haldol ordered.   John Copeland, John Rios, MD 10/30/16 2209

## 2016-10-30 NOTE — ED Notes (Signed)
Patients guardian Purcell NailsMarty A Prunty [phone]

## 2016-10-30 NOTE — ED Provider Notes (Signed)
[facility]-EMERGENCY DEPT Provider Note   CSN: [id number] Arrival date & time: 10/30/16  1710     History   Chief Complaint Chief Complaint  Patient presents with  . IVC    HPI John Copeland is a 26 y.o. male with a PMHx of schizophrenia, DM2, depression, anxiety, and HLD, who presents to the ED via [facility] under IVC. Per IVC paperwork "respondent has been diagnosed schizophrenia, depression, and anxiety. Respondent has been missing from a group home since June 7 without his meds. He is currently in a life-threatening situation since he does not have his diabetes medication. Even though [facility] DSS is his guardian, respondent has been seen at a homeless shelter signing his own paperwork. The last time the shelter staff saw him respondent was easily agitated and hyper religious. Given his mental health status and the need to have a guardian, as well as his lack of meds, respondent may be a danger to himself." Pt states he ran away from his group home because he does not like it, has been living in [employer] [facility] and admits that he hasn't been taking his medications in 2 weeks because he doesn't have them. He doesn't like Depakote or Haldol because "they keep him from achieving his goals" and cause him to gain weight and be sleepy. He denies SI, HI, AVH, was a drug use, alcohol use, or smoking cigarettes. He denies any other medical complaints at this time.   The history is provided by the patient and medical records. No language interpreter was used.  Mental Health Problem  Presenting symptoms: no hallucinations, no homicidal ideas and no suicidal thoughts   Patient accompanied by:  Law enforcement Onset quality:  Unable to specify Timing:  Unable to specify Progression:  Unable to specify Chronicity:  Recurrent Context: noncompliance   Treatment compliance:  Untreated Time since last psychoactive medication taken:  2 weeks Relieved by:  None tried Worsened by:   Nothing Ineffective treatments:  None tried Associated symptoms: no abdominal pain and no chest pain   Risk factors: hx of mental illness and recent psychiatric admission     Past Medical History:  Diagnosis Date  . Anxiety   . Depression   . Diabetes mellitus without complication (HCC)   . Schizophrenia [facility])     Patient Active Problem List   Diagnosis Date Noted  . Tobacco use disorder 10/07/2016  . Diabetes (HCC) 10/07/2016  . HTN (hypertension) 10/07/2016  . Dyslipidemia 10/07/2016  . Schizoaffective disorder, bipolar type (HCC) 11/30/2015    History reviewed. No pertinent surgical history.     Home Medications    Prior to Admission medications   Medication Sig Start Date End Date Taking? Authorizing Provider  atorvastatin (LIPITOR) 10 MG tablet Take 1 tablet (10 mg total) by mouth daily at 6 PM. 10/05/16   Clapacs, Jackquline Denmark, MD  diphenhydrAMINE (BENADRYL) 50 MG capsule Take 1 capsule (50 mg total) by mouth at bedtime. 10/12/16   Jimmy Footman, MD  divalproex (DEPAKOTE ER) 500 MG 24 hr tablet Take 3 tablets (1,500 mg total) by mouth at bedtime. 10/09/16   Jimmy Footman, MD  haloperidol (HALDOL) 5 MG tablet Take 1 tablet (5 mg total) by mouth 3 (three) times daily. 10/12/16   Jimmy Footman, MD  lisinopril (PRINIVIL,ZESTRIL) 10 MG tablet Take 1 tablet (10 mg total) by mouth daily. 10/06/16   Clapacs, Jackquline Denmark, MD  metFORMIN (GLUCOPHAGE) 1000 MG tablet Take 1 tablet (1,000 mg total) by mouth 2 (two)  times daily with a meal. 10/05/16   Clapacs, Jackquline Denmark, MD  traZODone (DESYREL) 100 MG tablet Take 1 tablet (100 mg total) by mouth at bedtime. 10/09/16   Jimmy Footman, MD    Family History No family history on file.  Social History Social History  Substance Use Topics  . Smoking status: Current Every Day Smoker    Packs/day: 0.50    Types: Cigarettes  . Smokeless tobacco: Never Used  . Alcohol use No     Allergies   Patient has  no known allergies.   Review of Systems Review of Systems  Constitutional: Negative for chills and fever.  Respiratory: Negative for shortness of breath.   Cardiovascular: Negative for chest pain.  Gastrointestinal: Negative for abdominal pain, constipation, diarrhea, nausea and vomiting.  Genitourinary: Negative for dysuria and hematuria.  Musculoskeletal: Negative for arthralgias and myalgias.  Skin: Negative for color change.  Allergic/Immunologic: Positive for immunocompromised state (DM2).  Neurological: Negative for weakness and numbness.  Psychiatric/Behavioral: Negative for confusion, hallucinations, homicidal ideas and suicidal ideas.   All other systems reviewed and are negative for acute change except as noted in the HPI.    Physical Exam Updated Vital Signs BP (!) 166/118 (BP Location: Left Arm)   Pulse (!) 102   Temp 98.8 F (37.1 C) (Axillary)   Resp 17   Ht 6' (1.829 m)   Wt 95.3 kg (210 lb)   SpO2 100%   BMI 28.48 kg/m   Physical Exam  Constitutional: He is oriented to person, place, and time. Vital signs are normal. He appears well-developed and well-nourished.  Non-toxic appearance. No distress.  Afebrile, nontoxic, NAD  HENT:  Head: Normocephalic and atraumatic.  Mouth/Throat: Oropharynx is clear and moist and mucous membranes are normal.  Eyes: Conjunctivae and EOM are normal. Right eye exhibits no discharge. Left eye exhibits no discharge.  Neck: Normal range of motion. Neck supple.  Cardiovascular: Normal rate, regular rhythm, normal heart sounds and intact distal pulses.  Exam reveals no gallop and no friction rub.   No murmur heard. Tachycardic in triage but resolved on exam; BP elevated, similar to prior values  Pulmonary/Chest: Effort normal and breath sounds normal. No respiratory distress. He has no decreased breath sounds. He has no wheezes. He has no rhonchi. He has no rales.  Abdominal: Soft. Normal appearance and bowel sounds are normal. He  exhibits no distension. There is no tenderness. There is no rigidity, no rebound, no guarding, no CVA tenderness, no tenderness at McBurney's point and negative Murphy's sign.  Musculoskeletal: Normal range of motion.  Neurological: He is alert and oriented to person, place, and time. He has normal strength. No sensory deficit.  Skin: Skin is warm, dry and intact. No rash noted.  Psychiatric: He has a normal mood and affect. He is not actively hallucinating. He expresses no homicidal and no suicidal ideation. He expresses no suicidal plans and no homicidal plans.  Calm, pleasant, and cooperative. Denies SI, HI, or AVH, doesn't seem to be responding to internal stimuli.   Nursing note and vitals reviewed.    ED Treatments / Results  Labs (all labs ordered are listed, but only abnormal results are displayed) Labs Reviewed  COMPREHENSIVE METABOLIC PANEL - Abnormal; Notable for the following:       Result Value   Glucose, Bld 112 (*)    All other components within normal limits  ACETAMINOPHEN LEVEL - Abnormal; Notable for the following:    Acetaminophen (Tylenol), Serum <10 (*)  All other components within normal limits  CBC - Abnormal; Notable for the following:    Hemoglobin 12.5 (*)    HCT 37.4 (*)    MCV 72.2 (*)    MCH 24.1 (*)    RDW 15.9 (*)    All other components within normal limits  ETHANOL  SALICYLATE LEVEL  RAPID URINE DRUG SCREEN, HOSP PERFORMED  CBG MONITORING, ED    EKG  EKG Interpretation None       Radiology No results found.  Procedures Procedures (including critical care time)  Medications Ordered in ED Medications  atorvastatin (LIPITOR) tablet 10 mg (not administered)  diphenhydrAMINE (BENADRYL) capsule 50 mg (not administered)  divalproex (DEPAKOTE ER) 24 hr tablet 1,500 mg (not administered)  haloperidol (HALDOL) tablet 5 mg (not administered)  lisinopril (PRINIVIL,ZESTRIL) tablet 10 mg (not administered)  metFORMIN (GLUCOPHAGE) tablet 1,000  mg (not administered)  traZODone (DESYREL) tablet 100 mg (not administered)  acetaminophen (TYLENOL) tablet 650 mg (not administered)  alum & mag hydroxide-simeth (MAALOX/MYLANTA) 200-200-20 MG/5ML suspension 30 mL (not administered)  ibuprofen (ADVIL,MOTRIN) tablet 600 mg (not administered)  ondansetron (ZOFRAN) tablet 4 mg (not administered)  ferrous sulfate tablet 325 mg (not administered)     Initial Impression / Assessment and Plan / ED Course  I have reviewed the triage vital signs and the nursing notes.  Pertinent labs & imaging results that were available during my care of the patient were reviewed by me and considered in my medical decision making (see chart for details).     26 y.o. male here under IVC due to concern for his safety. Ran away from group home, living in shelter, not taking meds. Denies HI/SI/AVH. Calm and cooperative on exam. Will get clearance labs and TTS consultation.   8:51 PM EtOH level undetectable. CBC w/diff with very mild microcytic anemia, marginally lower than prior values; doubt need for emergent work up at this time; will add-on iron supplementation. CMP unremarkable. Salicylate and acetaminophen levels WNL.  UDS pending, but does not interfere with med clearance. Pt medically cleared at this time. Psych hold orders and home med orders placed. Please see TTS notes for further documentation of care/dispo. Pt stable at time of med clearance.     Final Clinical Impressions(s) / ED Diagnoses   Final diagnoses:  Medical clearance for psychiatric admission  Involuntary commitment  Noncompliance with medication regimen  Iron deficiency anemia, unspecified iron deficiency anemia type  Essential hypertension    New Prescriptions New Prescriptions   No medications on [address]file     Seher Schlagel, Brown[place]cedes, [place]-C 10/30/16 2055    Jerelyn ScottLinker, Martha, MD 10/30/16 2126

## 2016-10-30 NOTE — Progress Notes (Signed)
Per Donell SievertSpencer, Simon, NP meets inpatient criteria Elsie LincolnShean K. Sherlon HandingHarris, LCAS-A, LPC-A, [facility]  Counselor 10/30/2016 9:43 PM

## 2016-10-30 NOTE — ED Notes (Addendum)
Patient currently asleep with no signs of distress noted. Respirations regular and unlabored. Sitter remains at bedside for safety. Medications effective for agitation.

## 2016-10-31 DIAGNOSIS — R45851 Suicidal ideations: Secondary | ICD-10-CM | POA: Diagnosis not present

## 2016-10-31 DIAGNOSIS — E1121 Type 2 diabetes mellitus with diabetic nephropathy: Secondary | ICD-10-CM | POA: Diagnosis not present

## 2016-10-31 DIAGNOSIS — F17210 Nicotine dependence, cigarettes, uncomplicated: Secondary | ICD-10-CM | POA: Diagnosis not present

## 2016-10-31 DIAGNOSIS — Z79899 Other long term (current) drug therapy: Secondary | ICD-10-CM

## 2016-10-31 DIAGNOSIS — F250 Schizoaffective disorder, bipolar type: Secondary | ICD-10-CM | POA: Diagnosis not present

## 2016-10-31 DIAGNOSIS — G4700 Insomnia, unspecified: Secondary | ICD-10-CM | POA: Diagnosis not present

## 2016-10-31 DIAGNOSIS — Z7984 Long term (current) use of oral hypoglycemic drugs: Secondary | ICD-10-CM

## 2016-10-31 DIAGNOSIS — I10 Essential (primary) hypertension: Secondary | ICD-10-CM | POA: Diagnosis not present

## 2016-10-31 DIAGNOSIS — Z818 Family history of other mental and behavioral disorders: Secondary | ICD-10-CM

## 2016-10-31 NOTE — Consult Note (Signed)
[facility] Consult   Reason for Consult:  Suicidal ideation with plan, IVC Referring Physician:  EDP Patient Identification: John Copeland MRN:  [id number] Principal Diagnosis: Schizoaffective disorder, bipolar type [facility]) Diagnosis:   Patient Active Problem List   Diagnosis Date Noted  . Schizoaffective disorder, bipolar type ([place]) [F25.0] 11/30/2015    Priority: High  . Tobacco use disorder [F17.200] 10/07/2016  . Diabetes ([place]) [E11.9] 10/07/2016  . HTN (hypertension) [I10] 10/07/2016  . Dyslipidemia [E78.5] 10/07/2016    Total Time spent with patient: 30 minutes  Subjective:   John Copeland is a 26 y.o. male patient admitted via police and was in the ED a few days ago with similar complaints. Pt seen and chart reviewed. Pt is alert/oriented x4, calm, cooperative, and appropriate to situation. Pt denies making suicidal statements but has been very inconsistent with the IVC for which he was brought here. Pt deemed high risk.   HPI: I have reviewed and concur with HPI elements below, modified as follows: "John Copeland is an 26 y.o. male, African American, Single who presents to [facility] under IVC per ED report: PMHx of schizophrenia, DM2, depression, anxiety, and HLD, who presents to the ED via [employer] under IVC. Per IVC paperwork "respondent has been diagnosed schizophrenia, depression, and anxiety. Respondent has been missing from a group home since June 7 without his meds. He is currently in a life-threatening situation since he does not have his diabetes medication. Even though [facility] DSS is his guardian, respondent has been seen at a homeless shelter signing his own paperwork. The last time the shelter staff saw him respondent was easily agitated and hyper religious. Given his mental health status and the need to have a guardian, as well as his lack of meds, respondent may be a danger to himself." Pt states he ran away from his group home because he does not like  it, has been living in [place] and admits that he hasn't been taking his medications in 2 weeks because he doesn't have them. He doesn't like Depakote or Haldol because "they keep him from achieving his goals" and cause him to gain weight and be sleepy. He denies SI, HI, AVH.  Patient states primary concern is he was seeking help and trying obtain appointment for meds through [facility] and had appointment in coming Monday. Patient was not very forthcoming and with flat affect with appearance of some confusion. Patient states he resides with [employer] currently, and had left his group home, but states is his own guardian. However confirmed, per IVC and per [facility] notes does have a guardian which is also in [facility].  Patient states has had same sleep amounts with at least 8 hours per night, and acknowledges non-med compliance.    Patient denies current SI/HI and AVH. Patient denies hx. Of S.A. Patient has been seen inpatient for psych care last was 09/2016 with [facility] for schizophrenia. Patient states is not currently seen outpatient for psych care. "  Pt continues to present with inconsistent statements, known to Dr. Darleene Cleaver, under IVC.   Social history: He is originally from the [place] area but has some relatives in [place] as well. [place] in group homes.  Past Psychiatric History: Patient has a history of schizoaffective disorder. He denies ever trying to hurt himself or being violent with anyone else. We've seen him before in the emergency room here  where he was making threatening statements although on that occasion it turned out that it was being done manipulatively just to get out of his group home. It doesn't sound like we have any evidence that he is ever actually been harmful in the past.  Risk to Self: Suicidal Ideation: No Suicidal Intent: No Is patient at risk for suicide?:  No Suicidal Plan?: No Access to Means: No What has been your use of drugs/alcohol within the last 12 months?: none How many times?: 0 Other Self Harm Risks: none Triggers for Past Attempts: None known Intentional Self Injurious Behavior: None Risk to Others: Homicidal Ideation: No Thoughts of Harm to Others: No Current Homicidal Intent: No Current Homicidal Plan: No Access to Homicidal Means: No Identified Victim: none History of harm to others?: No Assessment of Violence: None Noted Violent Behavior Description: n/a Does patient have access to weapons?: No Criminal Charges Pending?: No Does patient have a court date: No Prior Inpatient Therapy: Prior Inpatient Therapy: Yes Prior Therapy Dates: 2018 Prior Therapy Facilty/Provider(s): [facility] Reason for Treatment: Schizophrenia Prior Outpatient Therapy: Prior Outpatient Therapy: No Prior Therapy Dates: n/a Prior Therapy Facilty/Provider(s): n/a Reason for Treatment: n/a Does patient have an ACCT team?: Unknown Does patient have Intensive In-House Services?  : Unknown Does patient have [facility] services? : Unknown Does patient have P4CC services?: Unknown  Past Medical History:  Past Medical History:  Diagnosis Date  . Anxiety   . Depression   . Diabetes mellitus without complication ([place])   . Schizophrenia ([place])    History reviewed. No pertinent surgical history. Family History: No family history on file. Family Psychiatric  History: Patient says he thinks that his mother had bipolar disorder. Social History:  History  Alcohol Use No     History  Drug Use No    Social History   Social History  . Marital status: Single    Spouse name: N/A  . Number of children: N/A  . Years of education: N/A   Social History Main Topics  . Smoking status: Current Every Day Smoker    Packs/day: 0.50    Types: Cigarettes  . Smokeless tobacco: Never Used  . Alcohol use No  . Drug use: No  . Sexual activity: Not Asked    Other Topics Concern  . None   Social History Narrative  . None   Additional Social History:    Allergies:  No Known Allergies  Labs:  Results for orders placed or performed during the hospital encounter of 10/30/16 (from the past 48 hour(s))  POC CBG, ED     Status: None   Collection Time: 10/30/16  6:34 PM  Result Value Ref Range   Glucose-Capillary 98 65 - 99 mg/dL  Comprehensive metabolic panel     Status: Abnormal   Collection Time: 10/30/16  7:48 PM  Result Value Ref Range   Sodium 138 135 - 145 mmol/L   Potassium 4.1 3.5 - 5.1 mmol/L   Chloride 107 101 - 111 mmol/L   CO2 22 22 - 32 mmol/L   Glucose, Bld 112 (H) 65 - 99 mg/dL   BUN 9 6 - 20 mg/dL   Creatinine, Ser 0.86 0.61 - 1.24 mg/dL   Calcium 9.2 8.9 - 10.3 mg/dL   Total Protein 7.0 6.5 - 8.1 g/dL   Albumin 4.1 3.5 - 5.0 g/dL   AST 37 15 - 41 U/L   ALT 47 17 - 63 U/L   Alkaline Phosphatase 46 38 - 126  U/L   Total Bilirubin 0.4 0.3 - 1.2 mg/dL   GFR calc non Af Amer >60 >60 mL/min   GFR calc Af Amer >60 >60 mL/min    Comment: (NOTE) The eGFR has been calculated using the CKD EPI equation. This calculation has not been validated in all clinical situations. eGFR's persistently <60 mL/min signify possible Chronic Kidney Disease.    Anion gap 9 5 - 15  Ethanol     Status: None   Collection Time: 10/30/16  7:48 PM  Result Value Ref Range   Alcohol, Ethyl (B) <5 <5 mg/dL    Comment:        LOWEST DETECTABLE LIMIT FOR SERUM ALCOHOL IS 5 mg/dL FOR MEDICAL PURPOSES ONLY   Salicylate level     Status: None   Collection Time: 10/30/16  7:48 PM  Result Value Ref Range   Salicylate Lvl <5.4 2.8 - 30.0 mg/dL  Acetaminophen level     Status: Abnormal   Collection Time: 10/30/16  7:48 PM  Result Value Ref Range   Acetaminophen (Tylenol), Serum <10 (L) 10 - 30 ug/mL    Comment:        THERAPEUTIC CONCENTRATIONS VARY SIGNIFICANTLY. A RANGE OF 10-30 ug/mL MAY BE AN EFFECTIVE CONCENTRATION FOR MANY  PATIENTS. HOWEVER, SOME ARE BEST TREATED AT CONCENTRATIONS OUTSIDE THIS RANGE. ACETAMINOPHEN CONCENTRATIONS >150 ug/mL AT 4 HOURS AFTER INGESTION AND >50 ug/mL AT 12 HOURS AFTER INGESTION ARE OFTEN ASSOCIATED WITH TOXIC REACTIONS.   cbc     Status: Abnormal   Collection Time: 10/30/16  7:48 PM  Result Value Ref Range   WBC 7.3 4.0 - 10.5 K/uL   RBC 5.18 4.22 - 5.81 MIL/uL   Hemoglobin 12.5 (L) 13.0 - 17.0 g/dL   HCT 37.4 (L) 39.0 - 52.0 %   MCV 72.2 (L) 78.0 - 100.0 fL   MCH 24.1 (L) 26.0 - 34.0 pg   MCHC 33.4 30.0 - 36.0 g/dL   RDW 15.9 (H) 11.5 - 15.5 %   Platelets 260 150 - 400 K/uL  Rapid urine drug screen (hospital performed)     Status: None   Collection Time: 10/30/16  9:12 PM  Result Value Ref Range   Opiates NONE DETECTED NONE DETECTED   Cocaine NONE DETECTED NONE DETECTED   Benzodiazepines NONE DETECTED NONE DETECTED   Amphetamines NONE DETECTED NONE DETECTED   Tetrahydrocannabinol NONE DETECTED NONE DETECTED   Barbiturates NONE DETECTED NONE DETECTED    Comment:        DRUG SCREEN FOR MEDICAL PURPOSES ONLY.  IF CONFIRMATION IS NEEDED FOR ANY PURPOSE, NOTIFY LAB WITHIN 5 DAYS.        LOWEST DETECTABLE LIMITS FOR URINE DRUG SCREEN Drug Class       Cutoff (ng/mL) Amphetamine      1000 Barbiturate      200 Benzodiazepine   627 Tricyclics       035 Opiates          300 Cocaine          300 THC              50     Current Facility-Administered Medications  Medication Dose Route Frequency Provider Last Rate Last Dose  . acetaminophen (TYLENOL) tablet 650 mg  650 mg Oral Q4H PRN Street, [place], [place]      . alum & mag hydroxide-simeth (MAALOX/MYLANTA) 200-200-20 MG/5ML suspension 30 mL  30 mL Oral Q6H PRN Street, [place], PA-C      .  atorvastatin (LIPITOR) tablet 10 mg  10 mg Oral [address] [place], [place]      . diphenhydrAMINE (BENADRYL) capsule 50 mg  50 mg Oral QHS Street, [place], [place]      . divalproex (DEPAKOTE ER) 24 hr tablet 1,500 mg  1,500  mg Oral QHS Street, [place], [place]      . ferrous sulfate tablet 325 mg  325 mg Oral Q breakfast Street, [place], [place]      . haloperidol (HALDOL) tablet 5 mg  5 mg Oral TID Street, [place], [place]   5 mg at 10/31/16 1038  . haloperidol lactate (HALDOL) injection 2 mg  2 mg Intramuscular Q6H PRN Street, [place], [place]      . ibuprofen (ADVIL,MOTRIN) tablet 600 mg  600 mg Oral Q8H PRN Street, [place], [place]      . lisinopril (PRINIVIL,ZESTRIL) tablet 10 mg  10 mg Oral Daily [address], [place], [place]   10 mg at 10/31/16 1038  . metFORMIN (GLUCOPHAGE) tablet 1,000 mg  1,000 mg Oral BID WC Street, [place], PA-C   1,000 mg at 10/31/16 1037  . ondansetron (ZOFRAN) tablet 4 mg  4 mg Oral Q8H PRN Street, [place], PA-C      . traZODone (DESYREL) tablet 100 mg  100 mg Oral QHS Street, [place], [place]       Current Outpatient Prescriptions  Medication Sig Dispense Refill  . atorvastatin (LIPITOR) 10 MG tablet Take 1 tablet (10 mg total) by mouth daily at 6 PM. 7 tablet 0  . diphenhydrAMINE (BENADRYL) 50 MG capsule Take 1 capsule (50 mg total) by mouth at bedtime. 30 capsule 0  . divalproex (DEPAKOTE ER) 500 MG 24 hr tablet Take 3 tablets (1,500 mg total) by mouth at bedtime. 90 tablet 0  . haloperidol (HALDOL) 5 MG tablet Take 1 tablet (5 mg total) by mouth 3 (three) times daily. 90 tablet 0  . lisinopril (PRINIVIL,ZESTRIL) 10 MG tablet Take 1 tablet (10 mg total) by mouth daily. 7 tablet 0  . metFORMIN (GLUCOPHAGE) 1000 MG tablet Take 1 tablet (1,000 mg total) by mouth 2 (two) times daily with a meal. 14 tablet 0  . Multiple Vitamin (MULTIVITAMIN WITH MINERALS) TABS tablet Take 1 tablet by mouth daily.    . traZODone (DESYREL) 100 MG tablet Take 1 tablet (100 mg total) by mouth at bedtime. 30 tablet 0  . Vitamin D, Ergocalciferol, (DRISDOL) 50000 units CAPS capsule Take 50,000 Units by mouth every 7 (seven) days.      Musculoskeletal: Strength & Muscle Tone: within normal limits Gait & Station:  normal Patient leans: N/A  Psychiatric Specialty Exam: Physical Exam  Nursing note and vitals reviewed. Psychiatric: His speech is normal. His affect is blunt. He is withdrawn. Thought content is paranoid. Cognition and memory are normal. He expresses impulsivity. He expresses no homicidal and no suicidal ideation.    Review of Systems  Constitutional: Negative.   HENT: Negative.   Eyes: Negative.   Respiratory: Negative.   Cardiovascular: Negative.   Gastrointestinal: Negative.   Musculoskeletal: Negative.   Skin: Negative.   Neurological: Negative.   Psychiatric/Behavioral: Positive for depression. The patient is nervous/anxious and has insomnia.   All other systems reviewed and are negative.   Blood pressure 120/69, pulse 63, temperature 97.9 F (36.6 C), temperature source Oral, resp. rate 16, height 6' (1.829 m), weight 95.3 kg (210 lb), SpO2 98 %.Body mass index is 28.48 kg/m.  General Appearance: Casual and Fairly Groomed  Eye Contact:  Poor  Speech:  Clear and Coherent and Slow  Volume:  Decreased  Mood:  Euthymic  Affect:  Constricted  Thought Process:  Goal Directed  Orientation:  Full (Time, Place, and Person)  Thought Content:  Logical  Suicidal Thoughts:  Denies, yet inconsistent, minimizing, high risk due to recent visit  Homicidal Thoughts:  No  Memory:  Immediate;   Good Recent;   Fair Remote;   Fair  Judgement:  Fair  Insight:  Fair  Psychomotor Activity:  Normal  Concentration:  Concentration: Fair  Recall:  [employer] of Knowledge:  Fair  Language:  Fair  Akathisia:  No  Handed:  Right  AIMS (if indicated):     Assets:  Desire for Improvement Financial Resources/Insurance Housing Physical Health Resilience Social Support  ADL's:  Intact  Cognition:  Impaired,  Mild  Sleep:      Treatment Plan Summary: Schizoaffective disorder, bipolar type ([place]) unstable with recent suicidal ideation, treated as below:  Medications: -Continue lipitor  42m for HLD, Depakote 15021mpo qhs for mood stabilization, Haldol 77m69mo tid for psychosis, lisinopril 60m66mr HTN, Metformin 1000mg70mbid for DM2, Trazodone 100mg 40mhs prn insomnia  Disposition: Recommend psychiatric Inpatient admission when medically cleared.  WithroBenjamine Mola6/20/2018 11:31 AM  Patient seen face-to-face for psychiatric evaluation, chart reviewed and case discussed with the physician extender and developed treatment plan. Reviewed the information documented and agree with the treatment plan. MojeedCorena Pilgrim

## 2016-10-31 NOTE — ED Notes (Signed)
Patient's guardian, Reather LittlerMarty Prunty, phone today for patient update and to discuss placement options. He did request that if possible, he would prefer any [place] [facility] [place] so that Mr. Kerin Ransomrunty can be be involved in his care. He requests that he be called at: [phone] once a placement decision has been made.

## 2016-10-31 NOTE — ED Notes (Signed)
Patient up to bathroom with assistance from Clinical research associatewriter. Patient calm and cooperative with care at this time. No inappropriate behaviors noted. Pt returned to bed and went to sleep. Sitter remains at bedside for safety.

## 2016-10-31 NOTE — Progress Notes (Signed)
Pt accepted to [facility] >10 AM on 11/01/16.  Dr. Enrique Lopez accepting. Call report to [phone]   [facility] ED notified  Auryn Paige T. Jakyren Fluegge, MSW, LCSWA Clinical Social Work Disposition [phone]  

## 2016-10-31 NOTE — ED Notes (Signed)
Patient refused vital signs and remains agitated.

## 2016-10-31 NOTE — BH Assessment (Signed)
[facility] Assessment Progress Note  Per Thedore MinsMojeed Akintayo, MD, this pt requires psychiatric hospitalization at this time.  Pt presents under IVC initiated by law enforcement, and upheld by Dr Jannifer FranklinAkintayo.  The following facilities have been contacted to seek placement for this pt, with results as noted:  Beds available, information sent, decision pending:  [facility]   [facility]   John Copeland, [place] Triage Specialist [phone]

## 2016-11-01 LAB — CBG MONITORING, ED: Glucose-Capillary: 133 mg/dL — ABNORMAL HIGH (ref 65–99)

## 2016-11-01 NOTE — ED Notes (Signed)
Attempted to call [facility].  Nurse busy and will call back.

## 2016-11-01 NOTE — ED Notes (Signed)
Report called to NIKEMichael RN.  Sheriff called for transport.

## 2016-11-01 NOTE — BH Assessment (Signed)
[facility] Assessment Progress Note  At 08:43 this Clinical research associatewriter called [name] [phone]).  He serves as pt's legal guardian.  I notified him that [facility] today under IVC.  Doylene Canninghomas Brandin Dilday, MA Triage Specialist [phone]

## 2016-11-01 NOTE — ED Notes (Addendum)
Placement found  Sending provider Dr. Pleas KochAki Tayo  Receiving doctor: Dr. Regino SchultzeWang  [facility]: [facility], [facility] [place] [facility] ( bed assignment ready after 10 am )  11/01/16   [facility] [phone] Contact sheriff office for IVC transport

## 2016-11-01 NOTE — ED Notes (Signed)
Sheriff reported it might be this afternoon or tonight before patient can be transported.

## 2016-11-01 NOTE — ED Notes (Signed)
Patient lying in bed with covers pulled over head.  Patient did take medications without difficulty, but did not want to engage in much conversation.

## 2016-11-01 NOTE — ED Notes (Signed)
Patient refused temp 

## 2016-12-28 ENCOUNTER — Emergency Department: Payer: Medicaid Other

## 2016-12-28 ENCOUNTER — Emergency Department
Admission: EM | Admit: 2016-12-28 | Discharge: 2016-12-29 | Disposition: A | Discharge status: Home / Self Care | Payer: Medicaid Other | Attending: Emergency Medicine | Admitting: Emergency Medicine

## 2016-12-28 ENCOUNTER — Encounter: Payer: Self-pay | Admitting: Emergency Medicine

## 2016-12-28 DIAGNOSIS — R51 Headache: Secondary | ICD-10-CM | POA: Diagnosis not present

## 2016-12-28 DIAGNOSIS — R519 Headache, unspecified: Secondary | ICD-10-CM

## 2016-12-28 DIAGNOSIS — B9789 Other viral agents as the cause of diseases classified elsewhere: Secondary | ICD-10-CM | POA: Diagnosis not present

## 2016-12-28 DIAGNOSIS — Z79899 Other long term (current) drug therapy: Secondary | ICD-10-CM | POA: Diagnosis not present

## 2016-12-28 DIAGNOSIS — J069 Acute upper respiratory infection, unspecified: Secondary | ICD-10-CM | POA: Insufficient documentation

## 2016-12-28 DIAGNOSIS — E119 Type 2 diabetes mellitus without complications: Secondary | ICD-10-CM | POA: Diagnosis not present

## 2016-12-28 DIAGNOSIS — Z7984 Long term (current) use of oral hypoglycemic drugs: Secondary | ICD-10-CM | POA: Diagnosis not present

## 2016-12-28 DIAGNOSIS — F17210 Nicotine dependence, cigarettes, uncomplicated: Secondary | ICD-10-CM | POA: Diagnosis not present

## 2016-12-28 LAB — URINALYSIS, COMPLETE (UACMP) WITH MICROSCOPIC
Bacteria, UA: NONE SEEN
Bilirubin Urine: NEGATIVE
GLUCOSE, UA: NEGATIVE mg/dL
HGB URINE DIPSTICK: NEGATIVE
Ketones, ur: NEGATIVE mg/dL
Leukocytes, UA: NEGATIVE
NITRITE: NEGATIVE
Protein, ur: NEGATIVE mg/dL
SPECIFIC GRAVITY, URINE: 1.026 (ref 1.005–1.030)
Squamous Epithelial / HPF: NONE SEEN
pH: 6 (ref 5.0–8.0)

## 2016-12-28 LAB — COMPREHENSIVE METABOLIC PANEL
ALT: 21 U/L (ref 17–63)
AST: 19 U/L (ref 15–41)
Albumin: 4.2 g/dL (ref 3.5–5.0)
Alkaline Phosphatase: 51 U/L (ref 38–126)
Anion gap: 6 (ref 5–15)
BILIRUBIN TOTAL: 0.7 mg/dL (ref 0.3–1.2)
BUN: 14 mg/dL (ref 6–20)
CHLORIDE: 107 mmol/L (ref 101–111)
CO2: 26 mmol/L (ref 22–32)
CREATININE: 0.85 mg/dL (ref 0.61–1.24)
Calcium: 9.7 mg/dL (ref 8.9–10.3)
Glucose, Bld: 83 mg/dL (ref 65–99)
Potassium: 3.9 mmol/L (ref 3.5–5.1)
Sodium: 139 mmol/L (ref 135–145)
TOTAL PROTEIN: 7.4 g/dL (ref 6.5–8.1)

## 2016-12-28 LAB — CBC
HEMATOCRIT: 38.9 % — AB (ref 40.0–52.0)
Hemoglobin: 13 g/dL (ref 13.0–18.0)
MCH: 24.4 pg — AB (ref 26.0–34.0)
MCHC: 33.3 g/dL (ref 32.0–36.0)
MCV: 73.2 fL — AB (ref 80.0–100.0)
PLATELETS: 340 10*3/uL (ref 150–440)
RBC: 5.32 MIL/uL (ref 4.40–5.90)
RDW: 14.4 % (ref 11.5–14.5)
WBC: 8.1 10*3/uL (ref 3.8–10.6)

## 2016-12-28 LAB — CK: Total CK: 135 U/L (ref 49–397)

## 2016-12-28 MED ORDER — SODIUM CHLORIDE 0.9 % IV BOLUS (SEPSIS)
1000.0000 mL | Freq: Once | INTRAVENOUS | Status: AC
  Administered 2016-12-29: 1000 mL via INTRAVENOUS

## 2016-12-28 MED ORDER — KETOROLAC TROMETHAMINE 30 MG/ML IJ SOLN
15.0000 mg | Freq: Once | INTRAMUSCULAR | Status: AC
  Administered 2016-12-29: 15 mg via INTRAVENOUS
  Filled 2016-12-28: qty 1

## 2016-12-28 MED ORDER — DIPHENHYDRAMINE HCL 50 MG/ML IJ SOLN
25.0000 mg | Freq: Once | INTRAMUSCULAR | Status: AC
  Administered 2016-12-29: 25 mg via INTRAVENOUS
  Filled 2016-12-28: qty 1

## 2016-12-28 MED ORDER — METOCLOPRAMIDE HCL 5 MG/ML IJ SOLN
10.0000 mg | Freq: Once | INTRAMUSCULAR | Status: AC
  Administered 2016-12-29: 10 mg via INTRAVENOUS
  Filled 2016-12-28: qty 2

## 2016-12-28 NOTE — ED Triage Notes (Signed)
Pt ambulatory to triage per EMS from home. EMS sts pt has had a HA and muscle pain x1 day. Pt denies injury, extensive exercise. Pt sts "I have been sleeping a lot. My body just hurts." Pt has HX of DM, take metformin daily.

## 2016-12-28 NOTE — ED Provider Notes (Signed)
[facility] Emergency Department Provider Note  ____________________________________________  Time seen: Approximately 11:28 PM  I have reviewed the triage vital signs and the nursing notes.   HISTORY  Chief Complaint Headache and Muscle Pain   HPI John Copeland is a 26 y.o. male with a history of diabetes, hypertension, schizophrenia who presents for evaluation of viral syndrome. Patient reports a week of body aches, cough productive of yellow sputum, nausea, sore throat, few episodes of nonbloody nonbilious emesis. Patient also has had a headache that he describes as throbbing, frontal, severe, constant for a week and non-radiating. No neck stiffness, no rash, no tick bites, no diarrhea, no abdominal pain, no chest pain, no shortness of breath. He was seen at [facility] ago for similar symptoms and treated with Zofran.  Past Medical History:  Diagnosis Date  . Anxiety   . Depression   . Diabetes mellitus without complication (HCC)   . Schizophrenia [facility])     Patient Active Problem List   Diagnosis Date Noted  . Tobacco use disorder 10/07/2016  . Diabetes (HCC) 10/07/2016  . HTN (hypertension) 10/07/2016  . Dyslipidemia 10/07/2016  . Schizoaffective disorder, bipolar type (HCC) 11/30/2015    History reviewed. No pertinent surgical history.  Prior to Admission medications   Medication Sig Start Date End Date Taking? Authorizing Provider  atorvastatin (LIPITOR) 10 MG tablet Take 1 tablet (10 mg total) by mouth daily at 6 PM. 10/05/16   Clapacs, Jackquline Denmark, MD  diphenhydrAMINE (BENADRYL) 50 MG capsule Take 1 capsule (50 mg total) by mouth at bedtime. 10/12/16   Jimmy Footman, MD  divalproex (DEPAKOTE ER) 500 MG 24 hr tablet Take 3 tablets (1,500 mg total) by mouth at bedtime. 10/09/16   Jimmy Footman, MD  haloperidol (HALDOL) 5 MG tablet Take 1 tablet (5 mg total) by mouth 3 (three) times daily. 10/12/16   Jimmy Footman, MD  lisinopril (PRINIVIL,ZESTRIL) 10 MG tablet Take 1 tablet (10 mg total) by mouth daily. 10/06/16   Clapacs, Jackquline Denmark, MD  metFORMIN (GLUCOPHAGE) 1000 MG tablet Take 1 tablet (1,000 mg total) by mouth 2 (two) times daily with a meal. 10/05/16   Clapacs, Jackquline Denmark, MD  Multiple Vitamin (MULTIVITAMIN WITH MINERALS) TABS tablet Take 1 tablet by mouth daily.    [provider]  ondansetron (ZOFRAN) 4 MG tablet Take 1 tablet (4 mg total) by mouth every 8 (eight) hours as needed for nausea or vomiting. 12/29/16   Don Perking, [place], MD  traZODone (DESYREL) 100 MG tablet Take 1 tablet (100 mg total) by mouth at bedtime. 10/09/16   Jimmy Footman, MD  Vitamin D, Ergocalciferol, (DRISDOL) 50000 units CAPS capsule Take 50,000 Units by mouth every 7 (seven) days.    [provider]    Allergies Patient has no known allergies.  History reviewed. No pertinent family history.  Social History Social History  Substance Use Topics  . Smoking status: Current Every Day Smoker    Packs/day: 0.50    Types: Cigarettes  . Smokeless tobacco: Never Used  . Alcohol use No    Review of Systems  Constitutional: Negative for fever. + myalgias Eyes: Negative for visual changes. ENT: + sore throat. Neck: No neck pain  Cardiovascular: Negative for chest pain. Respiratory: Negative for shortness of breath. + cough Gastrointestinal: Negative for abdominal pain,  Diarrhea. + N.V Genitourinary: Negative for dysuria. Musculoskeletal: Negative for back pain. Skin: Negative for rash. Neurological: Negative for headaches, weakness or numbness. Psych: No SI  or HI  ____________________________________________   PHYSICAL EXAM:  VITAL SIGNS: ED Triage Vitals [12/28/16 2030]  Enc Vitals Group     BP 128/79     Pulse Rate 75     Resp 18     Temp 99.1 F (37.3 C)     Temp Source Oral     SpO2 100 %     Weight 206 lb (93.4 kg)     Height 6' (1.829 m)     Head Circumference       Peak Flow      Pain Score      Pain Loc      Pain Edu?      Excl. in GC?     Constitutional: Alert and oriented. Well appearing and in no apparent distress. HEENT:      Head: Normocephalic and atraumatic.         Eyes: Conjunctivae are normal. Sclera is non-icteric.       Mouth/Throat: Mucous membranes are moist.       Neck: Supple with no signs of meningismus. Cardiovascular: Regular rate and rhythm. No murmurs, gallops, or rubs. 2+ symmetrical distal pulses are present in all extremities. No JVD. Respiratory: Normal respiratory effort. Lungs are clear to auscultation bilaterally. No wheezes, crackles, or rhonchi.  Gastrointestinal: Soft, non tender, and non distended with positive bowel sounds. No rebound or guarding. Genitourinary: No CVA tenderness. Musculoskeletal: Nontender with normal range of motion in all extremities. No edema, cyanosis, or erythema of extremities. Neurologic: Normal speech and language. Face is symmetric. Moving all extremities. No gross focal neurologic deficits are appreciated. Skin: Skin is warm, dry and intact. No rash noted. Psychiatric: Mood and affect are normal. Speech and behavior are normal.  ____________________________________________   LABS (all labs ordered are listed, but only abnormal results are displayed)  Labs Reviewed  CBC - Abnormal; Notable for the following:       Result Value   HCT 38.9 (*)    MCV 73.2 (*)    MCH 24.4 (*)    All other components within normal limits  URINALYSIS, COMPLETE (UACMP) WITH MICROSCOPIC - Abnormal; Notable for the following:    Color, Urine YELLOW (*)    APPearance CLEAR (*)    All other components within normal limits  COMPREHENSIVE METABOLIC PANEL  CK   ____________________________________________  EKG  none  ____________________________________________  RADIOLOGY  CXR: negative  ____________________________________________   PROCEDURES  Procedure(s) performed:  None Procedures Critical Care performed:  None ____________________________________________   INITIAL IMPRESSION / ASSESSMENT AND PLAN / ED COURSE  26 y.o. male with a history of diabetes, hypertension, schizophrenia who presents for evaluation of viral syndrome x 1 week. No tick bites. Patient is well-appearing, in no distress, is normal vital signs, lungs are clear to auscultation, abdomen is soft and no tenderness throughout, no meningeal signs, no rash. Labs were no acute findings showing normal white count, normal CMP, and negative urinalysis. We'll get a chest x-ray to rule out pneumonia. Will give IVF, reglan, toradol, benadryl. Patient requesting meal tray during my evaluation, will provide food.     _________________________ 2:10 AM on 12/29/2016 -----------------------------------------  Labs and chest x-ray with no acute findings. Patient remains extremely well appearing, sleeping comfortably. His can be discharged home with Zofran and follow-up with primary care doctor for a viral syndrome. Discussed return precautions.  Pertinent labs & imaging results that were available during my care of the patient were reviewed by me and considered in my  medical decision making (see chart for details).    ____________________________________________   FINAL CLINICAL IMPRESSION(S) / ED DIAGNOSES  Final diagnoses:  Acute nonintractable headache, unspecified headache type  Viral URI with cough      NEW MEDICATIONS STARTED DURING THIS VISIT:  New Prescriptions   ONDANSETRON (ZOFRAN) 4 MG TABLET    Take 1 tablet (4 mg total) by mouth every 8 (eight) hours as needed for nausea or vomiting.     Note:  This document was prepared using Dragon voice recognition software and may include unintentional dictation errors.    Nita Sickle, MD 12/29/16 [id number]

## 2016-12-28 NOTE — ED Notes (Signed)
Pt brought in by EMS for neck pain that radiates down back and bilat arms x 2 days. FSBS per EMS 83.

## 2016-12-29 MED ORDER — ONDANSETRON HCL 4 MG PO TABS: 4.0000 mg | ORAL_TABLET | Freq: Three times a day (TID) | ORAL | 0 refills | Status: DC | PRN

## 2016-12-29 NOTE — Discharge Instructions (Signed)
You have been seen in the Emergency Department (ED) for a headache. Your evaluation today was overall reassuring. Headaches have many possible causes. Most headaches aren't a sign of a more serious problem, and they will get better on their own.   Follow-up with your doctor in 12-24 hours if you are still having a headache. Otherwise follow up with your doctor in 3-5 days.  For pain take tylenol 650mg  every 6 hours or ibuprofen 600mg  every 6 hours  When should you call for help?  Call 911 or return to the ED anytime you think you may need emergency care. For example, call if:  You have signs of a stroke. These may include:  Sudden numbness, paralysis, or weakness in your face, arm, or leg, especially on only one side of your body.  Sudden vision changes.  Sudden trouble speaking.  Sudden confusion or trouble understanding simple statements.  Sudden problems with walking or balance.  A sudden, severe headache that is different from past headaches. You have new or worsening headache Nausea and vomiting associated with your headache Fever, neck stiffness associated with your headache  Call your doctor now or seek immediate medical care if:  You have a new or worse headache.  Your headache gets much worse.  How can you care for yourself at home?  Do not drive if you have taken a prescription pain medicine.  Rest in a quiet, dark room until your headache is gone. Close your eyes and try to relax or go to sleep. Don't watch TV or read.  Put a cold, moist cloth or cold pack on the painful area for 10 to 20 minutes at a time. Put a thin cloth between the cold pack and your skin.  Use a warm, moist towel or a heating pad set on low to relax tight shoulder and neck muscles.  Have someone gently massage your neck and shoulders.  Take pain medicines exactly as directed.  If the doctor gave you a prescription medicine for pain, take it as prescribed.  If you are not taking a prescription pain  medicine, ask your doctor if you can take an over-the-counter medicine. Be careful not to take pain medicine more often than the instructions allow, because you may get worse or more frequent headaches when the medicine wears off.  Do not ignore new symptoms that occur with a headache, such as a fever, weakness or numbness, vision changes, or confusion. These may be signs of a more serious problem.  To prevent headaches  Keep a headache diary so you can figure out what triggers your headaches. Avoiding triggers may help you prevent headaches. Record when each headache began, how long it lasted, and what the pain was like (throbbing, aching, stabbing, or dull). Write down any other symptoms you had with the headache, such as nausea, flashing lights or dark spots, or sensitivity to bright light or loud noise. Note if the headache occurred near your period. List anything that might have triggered the headache, such as certain foods (chocolate, cheese, wine) or odors, smoke, bright light, stress, or lack of sleep.  Find healthy ways to deal with stress. Headaches are most common during or right after stressful times. Take time to relax before and after you do something that has caused a headache in the past.  Try to keep your muscles relaxed by keeping good posture. Check your jaw, face, neck, and shoulder muscles for tension, and try relaxing them. When sitting at a desk, change positions  often, and stretch for 30 seconds each hour.  °Get plenty of sleep and exercise.  °Eat regularly and well. Long periods without food can trigger a headache.  °Treat yourself to a massage. Some people find that regular massages are very helpful in relieving tension.  °Limit caffeine by not drinking too much coffee, tea, or soda. But don't quit caffeine suddenly, because that can also give you headaches.  °Reduce eyestrain from computers by blinking frequently and looking away from the computer screen every so often. Make sure  you have proper eyewear and that your monitor is set up properly, about an arm's length away.  °Seek help if you have depression or anxiety. Your headaches may be linked to these conditions. Treatment can both prevent headaches and help with symptoms of anxiety or depression. ° °

## 2016-12-29 NOTE — ED Notes (Signed)
Legal guardian. MARTY W/ [place] DSS [phone] called and VM left. After hours number [phone] called and information left with on-call SW. Pending call back to confirm Group home name for safe d/c back. Will continue to assess.

## 2016-12-29 NOTE — ED Notes (Signed)
Meal tray provided per PT request. Pending transportation to group home.

## 2016-12-29 NOTE — ED Notes (Signed)
[facility] called to transport back to group home. ETA 45 min per [employer].

## 2016-12-29 NOTE — ED Notes (Signed)
Taxi arrived, pt dressing and sent home per guardian via cab.

## 2016-12-29 NOTE — ED Notes (Signed)
On call SS notified of pt being d/c. Group home and address verified for safe d/c back to home. John Copeland from group home called at [phone] and stated for pt to take cab to group home and they have an account with [facility] for payment. Will d/c to group home.

## 2016-12-31 ENCOUNTER — Emergency Department
Admission: EM | Admit: 2016-12-31 | Discharge: 2016-12-31 | Disposition: A | Discharge status: Home / Self Care | Payer: Medicaid Other | Attending: Emergency Medicine | Admitting: Emergency Medicine

## 2016-12-31 DIAGNOSIS — R1110 Vomiting, unspecified: Secondary | ICD-10-CM | POA: Diagnosis present

## 2016-12-31 DIAGNOSIS — Z9189 Other specified personal risk factors, not elsewhere classified: Secondary | ICD-10-CM | POA: Diagnosis not present

## 2016-12-31 LAB — CBC
HEMATOCRIT: 39.4 % — AB (ref 40.0–52.0)
HEMOGLOBIN: 12.9 g/dL — AB (ref 13.0–18.0)
MCH: 24 pg — ABNORMAL LOW (ref 26.0–34.0)
MCHC: 32.8 g/dL (ref 32.0–36.0)
MCV: 73.3 fL — ABNORMAL LOW (ref 80.0–100.0)
Platelets: 323 10*3/uL (ref 150–440)
RBC: 5.38 MIL/uL (ref 4.40–5.90)
RDW: 14.4 % (ref 11.5–14.5)
WBC: 5.6 10*3/uL (ref 3.8–10.6)

## 2016-12-31 LAB — COMPREHENSIVE METABOLIC PANEL
ALBUMIN: 4.1 g/dL (ref 3.5–5.0)
ALT: 17 U/L (ref 17–63)
ANION GAP: 9 (ref 5–15)
AST: 17 U/L (ref 15–41)
Alkaline Phosphatase: 45 U/L (ref 38–126)
BILIRUBIN TOTAL: 1 mg/dL (ref 0.3–1.2)
BUN: 12 mg/dL (ref 6–20)
CO2: 23 mmol/L (ref 22–32)
Calcium: 9.5 mg/dL (ref 8.9–10.3)
Chloride: 105 mmol/L (ref 101–111)
Creatinine, Ser: 0.8 mg/dL (ref 0.61–1.24)
GFR calc non Af Amer: >60 mL/min (ref >60)
GLUCOSE: 79 mg/dL (ref 65–99)
POTASSIUM: 4.3 mmol/L (ref 3.5–5.1)
SODIUM: 137 mmol/L (ref 135–145)
TOTAL PROTEIN: 7.2 g/dL (ref 6.5–8.1)

## 2016-12-31 LAB — LIPASE, BLOOD: Lipase: 27 U/L (ref 11–51)

## 2016-12-31 MED ORDER — ONDANSETRON 4 MG PO TBDP: 4.0000 mg | ORAL_TABLET | Freq: Once | ORAL | Status: DC | PRN

## 2016-12-31 NOTE — ED Notes (Signed)
[facility] contacted [phone] , personnel is familiar with pt and will be on the look out. Gave them contact info to DSS [facility], or [employer].

## 2016-12-31 NOTE — ED Notes (Signed)
[employer] contacted [phone], personnel is also familiar with pt and will be on he look out. Gave them contact info for [facility] DSS and to [employer]

## 2016-12-31 NOTE — ED Triage Notes (Signed)
Pt states vomiting that began yesterday, 4 times yest, twice today. Denies blood in vomit. Denies diarrhea or fever. Pt is alert, oriented, ambulatory. Denies pain.

## 2016-12-31 NOTE — ED Provider Notes (Signed)
-----------------------------------------   4:20 PM on 12/31/2016 -----------------------------------------  This patient elected to elope prior to being seen. I did not see this patient.   Jeanmarie Plant, MD 12/31/16 1620

## 2016-12-31 NOTE — ED Notes (Signed)
Patient did not respond when name was called.

## 2016-12-31 NOTE — ED Notes (Addendum)
Per EMS pts blood glucose was 77 and the pt stated that the care home told him that they could not get his prescription filled because it was the weekend. Staff at the care home told EMS that they are suppose to be getting his prescription for nausea filled this afternoon.

## 2016-12-31 NOTE — ED Notes (Addendum)
Contacted chaplin due to last visit utilizing their services.

## 2016-12-31 NOTE — ED Notes (Addendum)
Called and spoke to DSS Sharl Ma to inform him that the pt is missing from [facility] ED lobby. Sharl Ma will reach out to the [employer].  [phone]

## 2016-12-31 NOTE — ED Notes (Signed)
[place] DSS [phone] for further guidance, was instructed by Sharl Ma to contact the local homeless shelters in [place], [facility] in [place], and [employer] in [place]. No further instruction for the rest of today. Sharl Ma will follow up tomorrow.

## 2016-12-31 NOTE — ED Notes (Signed)
This Rn called legal guardian, Sharl Ma. Called 2 numbers that are in chart. No answer.

## 2016-12-31 NOTE — ED Notes (Signed)
Contacted hospital security officer Hill to attempt to review video feed.

## 2016-12-31 NOTE — ED Notes (Signed)
ODS officer [place] called to update and report the pt was viewed through video feed  exiting the lobby at 12:39.02 wearing a black suite jacket, red undershirt, black pants, and carrying a blue bag walking up the sidewalk toward the parking lot.

## 2016-12-31 NOTE — ED Notes (Signed)
Spoke to [employer] officer Marcene Corning, and reported a missing person to local authorities, Marcene Corning received information concerning the pt's demographics, and medical hx that included mental illnesses.  Informed Marcene Corning that [place] DSS does not wish to activate Silver Alert at this time.

## 2016-12-31 NOTE — ED Notes (Signed)
Attempted to contact [employer] [facility] [place] [phone], was unable to contact any personnel, phone would divert caller to an extension. No extension that led to a person to speak to.

## 2016-12-31 NOTE — ED Notes (Signed)
Called hospital operator to activate security alert "missing person"

## 2016-12-31 NOTE — ED Notes (Signed)
Called Sharl Ma DSS [phone]), to give an update on "missing person".  Sharl Ma was informed that video feed showed pt exiting the ED lobby at 12:39.02 and headed up the sidewalk toward the parking lot and never reentered, pt again was wearing a Black suite jacket, red undershirt, black pants, blue bag.  Per Sharl MaI have contacted [facility], and the ACT team"  Sharl Ma requested that we contact our local authorities to keep a look out",  He wishes NOT to call a silver alert at this time.

## 2017-01-10 ENCOUNTER — Encounter: Payer: Self-pay | Admitting: Emergency Medicine

## 2017-01-10 ENCOUNTER — Emergency Department
Admission: EM | Admit: 2017-01-10 | Discharge: 2017-01-10 | Disposition: A | Discharge status: Home / Self Care | Payer: Medicaid Other | Attending: Emergency Medicine | Admitting: Emergency Medicine

## 2017-01-10 ENCOUNTER — Emergency Department: Payer: Medicaid Other

## 2017-01-10 DIAGNOSIS — I10 Essential (primary) hypertension: Secondary | ICD-10-CM | POA: Diagnosis not present

## 2017-01-10 DIAGNOSIS — T450X4A Poisoning by antiallergic and antiemetic drugs, undetermined, initial encounter: Secondary | ICD-10-CM

## 2017-01-10 DIAGNOSIS — E119 Type 2 diabetes mellitus without complications: Secondary | ICD-10-CM | POA: Diagnosis not present

## 2017-01-10 DIAGNOSIS — F250 Schizoaffective disorder, bipolar type: Secondary | ICD-10-CM | POA: Diagnosis not present

## 2017-01-10 DIAGNOSIS — T50901A Poisoning by unspecified drugs, medicaments and biological substances, accidental (unintentional), initial encounter: Secondary | ICD-10-CM

## 2017-01-10 DIAGNOSIS — F17210 Nicotine dependence, cigarettes, uncomplicated: Secondary | ICD-10-CM | POA: Diagnosis not present

## 2017-01-10 DIAGNOSIS — Z79899 Other long term (current) drug therapy: Secondary | ICD-10-CM | POA: Diagnosis not present

## 2017-01-10 DIAGNOSIS — F418 Other specified anxiety disorders: Secondary | ICD-10-CM | POA: Diagnosis not present

## 2017-01-10 DIAGNOSIS — T450X1A Poisoning by antiallergic and antiemetic drugs, accidental (unintentional), initial encounter: Secondary | ICD-10-CM | POA: Insufficient documentation

## 2017-01-10 LAB — ACETAMINOPHEN LEVEL
Acetaminophen (Tylenol), Serum: <10 ug/mL — ABNORMAL LOW (ref 10–30)
Acetaminophen (Tylenol), Serum: <10 ug/mL — ABNORMAL LOW (ref 10–30)

## 2017-01-10 LAB — HEPATIC FUNCTION PANEL
ALBUMIN: 4.2 g/dL (ref 3.5–5.0)
ALK PHOS: 45 U/L (ref 38–126)
ALT: 18 U/L (ref 17–63)
AST: 29 U/L (ref 15–41)
BILIRUBIN TOTAL: 0.5 mg/dL (ref 0.3–1.2)
Bilirubin, Direct: <0.1 mg/dL — ABNORMAL LOW (ref 0.1–0.5)
Total Protein: 7 g/dL (ref 6.5–8.1)

## 2017-01-10 LAB — BASIC METABOLIC PANEL
Anion gap: 10 (ref 5–15)
Anion gap: 7 (ref 5–15)
BUN: 6 mg/dL (ref 6–20)
BUN: <5 mg/dL — ABNORMAL LOW (ref 6–20)
CHLORIDE: 94 mmol/L — AB (ref 101–111)
CHLORIDE: 102 mmol/L (ref 101–111)
CO2: 24 mmol/L (ref 22–32)
CO2: 26 mmol/L (ref 22–32)
CREATININE: 1.11 mg/dL (ref 0.61–1.24)
CREATININE: 0.68 mg/dL (ref 0.61–1.24)
Calcium: 9.1 mg/dL (ref 8.9–10.3)
Calcium: 8.3 mg/dL — ABNORMAL LOW (ref 8.9–10.3)
GFR calc Af Amer: >60 mL/min (ref >60)
GFR calc non Af Amer: >60 mL/min (ref >60)
Glucose, Bld: 86 mg/dL (ref 65–99)
Glucose, Bld: 52 mg/dL — ABNORMAL LOW (ref 65–99)
POTASSIUM: 3.5 mmol/L (ref 3.5–5.1)
POTASSIUM: 3.3 mmol/L — AB (ref 3.5–5.1)
SODIUM: 135 mmol/L (ref 135–145)
Sodium: 128 mmol/L — ABNORMAL LOW (ref 135–145)

## 2017-01-10 LAB — SALICYLATE LEVEL: Salicylate Lvl: <7 mg/dL (ref 2.8–30.0)

## 2017-01-10 LAB — ETHANOL

## 2017-01-10 LAB — VALPROIC ACID LEVEL: Valproic Acid Lvl: 21 ug/mL — ABNORMAL LOW (ref 50.0–100.0)

## 2017-01-10 MED ORDER — HALOPERIDOL DECANOATE 100 MG/ML IM SOLN: 150.0000 mg | INTRAMUSCULAR | 0 refills | Status: DC

## 2017-01-10 MED ORDER — HALOPERIDOL DECANOATE 100 MG/ML IM SOLN
150.0000 mg | Freq: Once | INTRAMUSCULAR | Status: AC
  Administered 2017-01-10: 150 mg via INTRAMUSCULAR
  Filled 2017-01-10 (×3): qty 1.5

## 2017-01-10 MED ORDER — SODIUM CHLORIDE 0.9 % IV BOLUS (SEPSIS)
1000.0000 mL | Freq: Once | INTRAVENOUS | Status: AC
  Administered 2017-01-10: 1000 mL via INTRAVENOUS

## 2017-01-10 MED ORDER — DIVALPROEX SODIUM ER 500 MG PO TB24: 1000.0000 mg | ORAL_TABLET | Freq: Two times a day (BID) | ORAL | 0 refills | Status: DC

## 2017-01-10 NOTE — ED Triage Notes (Signed)
Pt comes into the ED via [facility]  from [facility] w/ c/o overdose on cough and cold medicine.  Patient states he has had a cold for the past couple of days and has been taking cough and cold medicine for the past couple days and then today he decided to take 16 tablets of the medicine.  Denies any SI or HI at this time.  Denies pain, shortness of breath, or chest pain.

## 2017-01-10 NOTE — ED Notes (Signed)
Pharmacy called and states they sent medication earlier. Unable to locate medication. Pharmacy states they will resend it now.

## 2017-01-10 NOTE — ED Notes (Signed)
[facility] called at this time and are on their way

## 2017-01-10 NOTE — BH Assessment (Signed)
Clinician received call from pt's ACT team psychiatric NP Melida Gimenez(Laurie Arina [phone]). NP states that she IVC'd pt. NP requesting phone call from Dr.Clapacs so that she may staff the pt with him. NP also states she may be contacted for pt's RX list. Dr.Clapacs informed.

## 2017-01-10 NOTE — ED Notes (Signed)
Pt resting in bed at this time, noted to have his hands folding as if praying with his eyes closed. Will continue to monitor for further patient needs.

## 2017-01-10 NOTE — ED Notes (Signed)
Received phone call from [name] who is pt's legal guardian. [name] updated on pt's initial complaint and that pt would be discharged and [facility] would [facility] to [facility] with no other stops in between.

## 2017-01-10 NOTE — ED Notes (Signed)
Per Aundra MilletMegan RN she will send message to pharmacy requesting medication for pt.

## 2017-01-10 NOTE — ED Notes (Signed)
Pt given meal tray at this time and apple juice. NAD noted. Pt is alert and oriented. Will continue to monitor for further patient needs.

## 2017-01-10 NOTE — ED Provider Notes (Signed)
[facility] Emergency Department Provider Note  ____________________________________________  Time seen: Approximately 6:49 PM  I have reviewed the triage vital signs and the nursing notes.   HISTORY  Chief Complaint Drug Overdose   HPI John Copeland is a 26 y.o. male with a history of diabetes, schizophrenia, depression, anxiety who presents IVC'ed for possible overdose.Patient reports that he has had a cough and cold symptoms for the last few days. This morning he was trying to self medicate and he took 16 tablets of cough and cold HBP. It tablet has 4 mg of chlorpheniramine and 30 mg of dextromethorphan HBR. This ingestion happened at 5:30AM. Patient later got concerned and told [facility] at [facility]. He called EMS himself to come to the emergency room. However IVC papers were taken due to concerns of possible intentional overdose. Patient denies suicidal or homicidal ideation at this time. He denies dizziness, chest pain, shortness of breath, fever, abdominal pain, nausea, vomiting. He denies taking any other medications.  Past Medical History:  Diagnosis Date  . Anxiety   . Depression   . Diabetes mellitus without complication (HCC)   . Schizophrenia [facility](HCC)     Patient Active Problem List   Diagnosis Date Noted  . Diphenhydramine overdose of undetermined intent 01/10/2017  . Tobacco use disorder 10/07/2016  . Diabetes (HCC) 10/07/2016  . HTN (hypertension) 10/07/2016  . Dyslipidemia 10/07/2016  . Schizoaffective disorder, bipolar type (HCC) 11/30/2015    History reviewed. No pertinent surgical history.  Prior to Admission medications   Medication Sig Start Date End Date Taking? Authorizing Provider  atorvastatin (LIPITOR) 10 MG tablet Take 1 tablet (10 mg total) by mouth daily at 6 PM. 10/05/16   Clapacs, Jackquline DenmarkJohn T, MD  diphenhydrAMINE (BENADRYL) 50 MG capsule Take 1 capsule (50 mg total) by mouth at bedtime. 10/12/16   Jimmy FootmanHernandez-Gonzalez,  Andrea, MD  divalproex (DEPAKOTE ER) 500 MG 24 hr tablet Take 2 tablets (1,000 mg total) by mouth 2 (two) times daily. 01/10/17   Clapacs, Jackquline DenmarkJohn T, MD  haloperidol (HALDOL) 5 MG tablet Take 1 tablet (5 mg total) by mouth 3 (three) times daily. 10/12/16   Jimmy FootmanHernandez-Gonzalez, Andrea, MD  haloperidol decanoate (HALDOL DECANOATE) 100 MG/ML injection Inject 1.5 mLs (150 mg total) into the muscle every 28 (twenty-eight) days. 02/07/17   Clapacs, Jackquline DenmarkJohn T, MD  lisinopril (PRINIVIL,ZESTRIL) 10 MG tablet Take 1 tablet (10 mg total) by mouth daily. 10/06/16   Clapacs, Jackquline DenmarkJohn T, MD  metFORMIN (GLUCOPHAGE) 1000 MG tablet Take 1 tablet (1,000 mg total) by mouth 2 (two) times daily with a meal. 10/05/16   Clapacs, Jackquline DenmarkJohn T, MD  Multiple Vitamin (MULTIVITAMIN WITH MINERALS) TABS tablet Take 1 tablet by mouth daily.    [provider]  ondansetron (ZOFRAN) 4 MG tablet Take 1 tablet (4 mg total) by mouth every 8 (eight) hours as needed for nausea or vomiting. 12/29/16   Don PerkingVeronese, [place], MD  traZODone (DESYREL) 100 MG tablet Take 1 tablet (100 mg total) by mouth at bedtime. 10/09/16   Jimmy FootmanHernandez-Gonzalez, Andrea, MD  Vitamin D, Ergocalciferol, (DRISDOL) 50000 units CAPS capsule Take 50,000 Units by mouth every 7 (seven) days.    [provider]    Allergies Patient has no known allergies.  No family history on file.  Social History Social History  Substance Use Topics  . Smoking status: Current Every Day Smoker    Packs/day: 0.50    Types: Cigarettes  . Smokeless tobacco: Never Used  . Alcohol use  No    Review of Systems  Constitutional: Negative for fever. Eyes: Negative for visual changes. ENT: Negative for sore throat. Neck: No neck pain  Cardiovascular: Negative for chest pain. Respiratory: Negative for shortness of breath. + cough Gastrointestinal: Negative for abdominal pain, vomiting or diarrhea. Genitourinary: Negative for dysuria. Musculoskeletal: Negative for back pain. Skin:  Negative for rash. Neurological: Negative for headaches, weakness or numbness. Psych: No SI or HI  ____________________________________________   PHYSICAL EXAM:  VITAL SIGNS: ED Triage Vitals  Enc Vitals Group     BP 01/10/17 1529 (!) 153/96     Pulse Rate 01/10/17 1529 (!) 111     Resp 01/10/17 1529 18     Temp 01/10/17 1529 98.3 F (36.8 C)     Temp Source 01/10/17 1529 Oral     SpO2 01/10/17 1529 100 %     Weight 01/10/17 1526 206 lb (93.4 kg)     Height 01/10/17 1526 6' (1.829 m)     Head Circumference --      Peak Flow --      Pain Score --      Pain Loc --      Pain Edu? --      Excl. in GC? --     Constitutional: Alert and oriented. Well appearing and in no apparent distress. HEENT:      Head: Normocephalic and atraumatic.         Eyes: Conjunctivae are normal. Sclera is non-icteric.       Mouth/Throat: Mucous membranes are moist.       Neck: Supple with no signs of meningismus. Cardiovascular: Regular rate and rhythm. No murmurs, gallops, or rubs. 2+ symmetrical distal pulses are present in all extremities. No JVD. Respiratory: Normal respiratory effort. Lungs are clear to auscultation bilaterally. No wheezes, crackles, or rhonchi.  Gastrointestinal: Soft, non tender, and non distended with positive bowel sounds. No rebound or guarding. Musculoskeletal: Nontender with normal range of motion in all extremities. No edema, cyanosis, or erythema of extremities. Neurologic: Normal speech and language. Face is symmetric. Moving all extremities. No gross focal neurologic deficits are appreciated. Skin: Skin is warm, dry and intact. No rash noted. Psychiatric: Mood and affect are normal. Speech and behavior are normal.  ____________________________________________   LABS (all labs ordered are listed, but only abnormal results are displayed)  Labs Reviewed  VALPROIC ACID LEVEL - Abnormal; Notable for the following:       Result Value   Valproic Acid Lvl 21 (*)     All other components within normal limits  ACETAMINOPHEN LEVEL - Abnormal; Notable for the following:    Acetaminophen (Tylenol), Serum <10 (*)    All other components within normal limits  HEPATIC FUNCTION PANEL - Abnormal; Notable for the following:    Bilirubin, Direct <0.1 (*)    All other components within normal limits  BASIC METABOLIC PANEL - Abnormal; Notable for the following:    Sodium 128 (*)    Chloride 94 (*)    All other components within normal limits  ACETAMINOPHEN LEVEL - Abnormal; Notable for the following:    Acetaminophen (Tylenol), Serum <10 (*)    All other components within normal limits  BASIC METABOLIC PANEL - Abnormal; Notable for the following:    Potassium 3.3 (*)    Glucose, Bld 52 (*)    BUN <5 (*)    Calcium 8.3 (*)    All other components within normal limits  SALICYLATE LEVEL  ETHANOL  SALICYLATE LEVEL   ____________________________________________  EKG  ED ECG REPORT I, Nita Sickle, the attending physician, personally viewed and interpreted this ECG.  Sinus tachycardia, rate of 100, normal QRS and QTc intervals, left axis deviation, STE in anterior leads with no reciprocal changes, BER  20:48 - Normal sinus rhythm, rate of 82, normal intervals, right axis deviation, no ST elevations or depressions. ____________________________________________  RADIOLOGY  none ____________________________________________   PROCEDURES  Procedure(s) performed: None Procedures Critical Care performed:  None ____________________________________________   INITIAL IMPRESSION / ASSESSMENT AND PLAN / ED COURSE  26 y.o. male with a history of diabetes, schizophrenia, depression, anxiety who presents IVC'ed for possible overdose after taking 16 tablets of the chlorpheniramine 4 mg and dextromethorphan 30 mg at 5:30 AM. Patient is asymptomatic. EKG with normal intervals. Patient initially IVC. Seen by Dr. Toni Amend who rescinded the IVC since patient  denies any suicidal intent. His initial blood work including Tylenol and salicylate levels, LFTs, and BMP show no acute findings. Poison control was consult and who recommended repeating salicylate and Tylenol levels within 4 hours. Patient given IVF. Will monitor on telemetry.    _________________________ 8:52 PM on 01/10/2017 ----------------------------------------- Patient monitor for 6 hours. Repeat EKG with normal intervals. Repeat Tylenol salicylate levels within normal limits. Patient be discharged back to his group home.   Pertinent labs & imaging results that were available during my care of the patient were reviewed by me and considered in my medical decision making (see chart for details).    ____________________________________________   FINAL CLINICAL IMPRESSION(S) / ED DIAGNOSES  Final diagnoses:  Acute drug overdose, accidental or unintentional, initial encounter      NEW MEDICATIONS STARTED DURING THIS VISIT:  Current Discharge Medication List    START taking these medications   Details  haloperidol decanoate (HALDOL DECANOATE) 100 MG/ML injection Inject 1.5 mLs (150 mg total) into the muscle every 28 (twenty-eight) days. Qty: 1 mL, Refills: 0         Note:  This document was prepared using Dragon voice recognition software and may include unintentional dictation errors.    Nita Sickle, MD 01/10/17 2053

## 2017-01-10 NOTE — ED Notes (Signed)
Pt put into [employer] by this RN and spoke with Wilnette Kaleshelma who was taxi driver and informed her to go straight to [facility]. Thelma verbalized understanding. This RN watched taxi cab pull away with pt.  Lucendia HerrlichFaye from [facility] called and updated that pt just put into taxi cab and left facility and has prescription for medication and paperwork in belongings bag.

## 2017-01-10 NOTE — ED Notes (Signed)
Spoke with Lucendia HerrlichFaye from [facility] and advised her that the pt was up for discharge. Per Lucendia HerrlichFaye call [employer] and have pt brought home via taxi.

## 2017-01-10 NOTE — ED Notes (Signed)
Pt's legal guardian called and left message with answering service of [name]. Called after hours number and spoke with Alonna BucklerJane Weinberger who informed me that she will send emails to update legal guardian of pt being discharged back to [facility].

## 2017-01-10 NOTE — ED Notes (Addendum)
Poison control called and informed of ingestion. They suggest symptomatic and supportive care. Recommendation includes:  Hydration, benzos for agitation, checking a depakote level, LFT's, acetaminophen level and monitor for 4-6 hours.  Recommendations were made per [facility] with Poison control. She included that we should Optimize the potassium and mag,  Watch for possible conduction disturbance with QT changes and monitor for tachycardia.

## 2017-01-10 NOTE — Consult Note (Signed)
[facility] Face-to-Face Psychiatry Consult   Reason for Consult:  Consult for 26 year old man with a history of chronic mental health problems who was brought in because of taking an overdose of cough and cold medicine Referring Physician:  Donna Christen Patient Identification: John Copeland MRN:  [id number] Principal Diagnosis: Schizoaffective disorder, bipolar type [facility]) Diagnosis:   Patient Active Problem List   Diagnosis Date Noted  . Diphenhydramine overdose of undetermined intent [T45.0X4A] 01/10/2017  . Tobacco use disorder [F17.200] 10/07/2016  . Diabetes (HCC) [E11.9] 10/07/2016  . HTN (hypertension) [I10] 10/07/2016  . Dyslipidemia [E78.5] 10/07/2016  . Schizoaffective disorder, bipolar type (HCC) [F25.0] 11/30/2015    Total Time spent with patient: 1 hour  Subjective:   John Copeland is a 26 y.o. male patient admitted with "I just had a cold".  HPI:  Patient interviewed chart reviewed. Also spoke with the nurse from the [facility] act team who are now working with the patient. 26 year old man states that about 5:00 this morning he took all 16 pills of cough and cold medicine and that he got from the [employer] store. He says that he did that because he was having symptoms of a cold. He is a bit at a loss to describe exactly what those symptoms were. He insists however that he took the pills. Really for therapeutic effect. He absolutely denies there was any intention to harm himself and also denies there was any intention to become intoxicated. Evidently several hours later he was feeling weird. At one point he told me that his heart was racing but he also said that he was getting the feeling that the cat at the house was threatening him or talking to him and he knew that that was not right. He called 911 himself and has been stabilized in the emergency room. The act team filed commitment papers after he was brought in once they heard about the overdose. Patient says his recent mood has been good. He  denies hallucinations. Totally denies suicidal or homicidal ideation. He says he's been getting very religious recently and has been doing a lot of praying and preaching to people. He says he's been walking around the neighborhood preaching to people and preaching to the other people at the group home. The act team also notes that he has been hyper religious recently. It hasn't yet caused a major problem but it's becoming more notable. This is different than his usual baseline. The group home feels certain that he has been compliant with his medicine. There is no evidence that he is abusing any drugs or alcohol.  Social history: Patient has a legal guardian. He resides in a local group home.  Medical history: Diabetes high blood pressure both managed with medication  Substance abuse history: Patient denies any recent alcohol or drug use and there isn't any past records indicate a serious previous alcohol or drug problem  Past Psychiatric History: Patient has been seen several times in the emergency room's of our system over the last year or 2. He's had at least one admission here. Has a diagnosis of schizoaffective disorder. In the past has also clearly malingered symptoms when he wants to get out of particular group homes. Patient has been maintained on Depakote and Haldol at fairly modest doses for as long as I have records. We don't have any previous record of the Depakote level but today's level is 21. Patient denies any previous suicide attempts or violence.  Risk to Self: Is patient at risk for suicide?: No  Risk to Others:   Prior Inpatient Therapy:   Prior Outpatient Therapy:    Past Medical History:  Past Medical History:  Diagnosis Date  . Anxiety   . Depression   . Diabetes mellitus without complication (HCC)   . Schizophrenia (HCC)    History reviewed. No pertinent surgical history. Family History: No family history on file. Family Psychiatric  History: Does not know Social  History:  History  Alcohol Use No     History  Drug Use No    Social History   Social History  . Marital status: Single    Spouse name: N/A  . Number of children: N/A  . Years of education: N/A   Social History Main Topics  . Smoking status: Current Every Day Smoker    Packs/day: 0.50    Types: Cigarettes  . Smokeless tobacco: Never Used  . Alcohol use No  . Drug use: No  . Sexual activity: Not Asked   Other Topics Concern  . None   Social History Narrative  . None   Additional Social History:    Allergies:  No Known Allergies  Labs:  Results for orders placed or performed during the hospital encounter of 01/10/17 (from the past 48 hour(s))  Valproic acid level     Status: Abnormal   Collection Time: 01/10/17  3:38 PM  Result Value Ref Range   Valproic Acid Lvl 21 (L) 50.0 - 100.0 ug/mL  Acetaminophen level     Status: Abnormal   Collection Time: 01/10/17  3:38 PM  Result Value Ref Range   Acetaminophen (Tylenol), Serum <10 (L) 10 - 30 ug/mL    Comment:        THERAPEUTIC CONCENTRATIONS VARY SIGNIFICANTLY. A RANGE OF 10-30 ug/mL MAY BE AN EFFECTIVE CONCENTRATION FOR MANY PATIENTS. HOWEVER, SOME ARE BEST TREATED AT CONCENTRATIONS OUTSIDE THIS RANGE. ACETAMINOPHEN CONCENTRATIONS >150 ug/mL AT 4 HOURS AFTER INGESTION AND >50 ug/mL AT 12 HOURS AFTER INGESTION ARE OFTEN ASSOCIATED WITH TOXIC REACTIONS.   Hepatic function panel     Status: Abnormal   Collection Time: 01/10/17  3:38 PM  Result Value Ref Range   Total Protein 7.0 6.5 - 8.1 g/dL   Albumin 4.2 3.5 - 5.0 g/dL   AST 29 15 - 41 U/L   ALT 18 17 - 63 U/L   Alkaline Phosphatase 45 38 - 126 U/L   Total Bilirubin 0.5 0.3 - 1.2 mg/dL   Bilirubin, Direct <1.6<0.1 (L) 0.1 - 0.5 mg/dL   Indirect Bilirubin NOT CALCULATED 0.3 - 0.9 mg/dL    Current Facility-Administered Medications  Medication Dose Route Frequency Provider Last Rate Last Dose  . haloperidol decanoate (HALDOL DECANOATE) 100 MG/ML  injection 150 mg  150 mg Intramuscular Once Clapacs, Jackquline DenmarkJohn T, MD       Current Outpatient Prescriptions  Medication Sig Dispense Refill  . atorvastatin (LIPITOR) 10 MG tablet Take 1 tablet (10 mg total) by mouth daily at 6 PM. 7 tablet 0  . diphenhydrAMINE (BENADRYL) 50 MG capsule Take 1 capsule (50 mg total) by mouth at bedtime. 30 capsule 0  . divalproex (DEPAKOTE ER) 500 MG 24 hr tablet Take 2 tablets (1,000 mg total) by mouth 2 (two) times daily. 120 tablet 0  . haloperidol (HALDOL) 5 MG tablet Take 1 tablet (5 mg total) by mouth 3 (three) times daily. 90 tablet 0  . [START ON 02/07/2017] haloperidol decanoate (HALDOL DECANOATE) 100 MG/ML injection Inject 1.5 mLs (150 mg total) into the muscle every 28 (  twenty-eight) days. 1 mL 0  . lisinopril (PRINIVIL,ZESTRIL) 10 MG tablet Take 1 tablet (10 mg total) by mouth daily. 7 tablet 0  . metFORMIN (GLUCOPHAGE) 1000 MG tablet Take 1 tablet (1,000 mg total) by mouth 2 (two) times daily with a meal. 14 tablet 0  . Multiple Vitamin (MULTIVITAMIN WITH MINERALS) TABS tablet Take 1 tablet by mouth daily.    . ondansetron (ZOFRAN) 4 MG tablet Take 1 tablet (4 mg total) by mouth every 8 (eight) hours as needed for nausea or vomiting. 20 tablet 0  . traZODone (DESYREL) 100 MG tablet Take 1 tablet (100 mg total) by mouth at bedtime. 30 tablet 0  . Vitamin D, Ergocalciferol, (DRISDOL) 50000 units CAPS capsule Take 50,000 Units by mouth every 7 (seven) days.      Musculoskeletal: Strength & Muscle Tone: within normal limits Gait & Station: normal Patient leans: N/A  Psychiatric Specialty Exam: Physical Exam  Nursing note and vitals reviewed. Constitutional: He appears well-developed and well-nourished.  HENT:  Head: Normocephalic and atraumatic.  Eyes: Pupils are equal, round, and reactive to light. Conjunctivae are normal.  Neck: Normal range of motion.  Cardiovascular: Regular rhythm and normal heart sounds.   Respiratory: Effort normal. No  respiratory distress.  GI: Soft.  Musculoskeletal: Normal range of motion.  Neurological: He is alert.  Skin: Skin is warm and dry.  Psychiatric: His mood appears anxious. His speech is rapid and/or pressured. He is not agitated and not aggressive. Thought content is not paranoid. Cognition and memory are impaired. He expresses impulsivity. He expresses no homicidal and no suicidal ideation.    Review of Systems  Constitutional: Negative.   HENT: Negative.   Eyes: Negative.   Respiratory: Negative.   Cardiovascular: Negative.   Gastrointestinal: Negative.   Musculoskeletal: Negative.   Skin: Negative.   Neurological: Negative.   Psychiatric/Behavioral: Negative for depression, hallucinations, memory loss, substance abuse and suicidal ideas. The patient is not nervous/anxious and does not have insomnia.     Blood pressure (!) 153/96, pulse (!) 111, temperature 98.3 F (36.8 C), temperature source Oral, resp. rate 18, height 6' (1.829 m), weight 206 lb (93.4 kg), SpO2 100 %.Body mass index is 27.94 kg/m.  General Appearance: Casual  Eye Contact:  Good  Speech:  Clear and Coherent and Pressured  Volume:  Increased  Mood:  Anxious  Affect:  Mostly he seems anxious and a little bit frightened. He was clearly frightened at the idea of being admitted to the hospital and got a little tearful when he was begging me not to admit him  Thought Process:  Coherent  Orientation:  Full (Time, Place, and Person)  Thought Content:  His conversation is lucid although he is also hyper religious. He keeps it under control however and isn't acting bizarre with it.  Suicidal Thoughts:  No  Homicidal Thoughts:  No  Memory:  Immediate;   Good Recent;   Fair Remote;   Fair  Judgement:  Fair  Insight:  Shallow  Psychomotor Activity:  Normal  Concentration:  Concentration: Fair  Recall:  [employer] of Knowledge:  Fair  Language:  Fair  Akathisia:  No  Handed:  Right  AIMS (if indicated):      Assets:  Desire for Improvement Housing Physical Health Resilience Social Support  ADL's:  Intact  Cognition:  WNL  Sleep:        Treatment Plan Summary: Medication management and Plan Based on my interaction with the patient I do  not get the impression that he is suicidal. He has absolutely denied any thought about killing himself throughout the entire episode. He does not appear to be depressed and there is no history to suggest recent depression. While he is hyper religious he is not showing signs of obvious delusions. Patient does not have a past history of suicide attempts. Ultimately I do not think he meets commitment criteria and I am going to take him off of IVC. However after speaking to the representative from his act team I agree that he is seeming more hypomanic than his usual baseline. I advised the patient that I would like him to get a Haldol decanoate shot and he agreed to it. We will administer 150 mg of Haldol Decanoate today and I have provided a prescription for that to be given in 4 weeks again. I told him he is to continue taking his pills meanwhile. I also think he should increase his Depakote dose since his blood level is low. I've included a new prescription for 1000 mg twice a day. Patient is agreeable to that. I communicated this back to the act team. Case reviewed with emergency room physician. Patient can be released home once they are convinced he is medically stable  Disposition: Patient does not meet criteria for psychiatric inpatient admission. Supportive therapy provided about ongoing stressors.  Mordecai Rasmussen, MD 01/10/2017 5:28 PM

## 2017-01-10 NOTE — ED Notes (Signed)
Repeat EKG

## 2017-01-10 NOTE — ED Notes (Signed)
Dr. Clapacs at bedside at this time.  

## 2017-01-11 ENCOUNTER — Encounter: Payer: Self-pay | Admitting: Emergency Medicine

## 2017-01-11 ENCOUNTER — Observation Stay
Admission: EM | Admit: 2017-01-11 | Discharge: 2017-01-12 | Disposition: A | Discharge status: Home / Self Care | Payer: Medicaid Other | Attending: Internal Medicine | Admitting: Internal Medicine

## 2017-01-11 DIAGNOSIS — Z79899 Other long term (current) drug therapy: Secondary | ICD-10-CM | POA: Insufficient documentation

## 2017-01-11 DIAGNOSIS — E871 Hypo-osmolality and hyponatremia: Secondary | ICD-10-CM | POA: Diagnosis not present

## 2017-01-11 DIAGNOSIS — Z7984 Long term (current) use of oral hypoglycemic drugs: Secondary | ICD-10-CM | POA: Diagnosis not present

## 2017-01-11 DIAGNOSIS — F29 Unspecified psychosis not due to a substance or known physiological condition: Secondary | ICD-10-CM

## 2017-01-11 DIAGNOSIS — F250 Schizoaffective disorder, bipolar type: Secondary | ICD-10-CM | POA: Diagnosis not present

## 2017-01-11 DIAGNOSIS — E119 Type 2 diabetes mellitus without complications: Secondary | ICD-10-CM | POA: Diagnosis not present

## 2017-01-11 DIAGNOSIS — I10 Essential (primary) hypertension: Secondary | ICD-10-CM | POA: Diagnosis present

## 2017-01-11 DIAGNOSIS — E876 Hypokalemia: Secondary | ICD-10-CM | POA: Diagnosis not present

## 2017-01-11 DIAGNOSIS — R9431 Abnormal electrocardiogram [ECG] [EKG]: Secondary | ICD-10-CM | POA: Diagnosis not present

## 2017-01-11 DIAGNOSIS — F17210 Nicotine dependence, cigarettes, uncomplicated: Secondary | ICD-10-CM | POA: Insufficient documentation

## 2017-01-11 DIAGNOSIS — E785 Hyperlipidemia, unspecified: Secondary | ICD-10-CM | POA: Insufficient documentation

## 2017-01-11 DIAGNOSIS — F419 Anxiety disorder, unspecified: Secondary | ICD-10-CM | POA: Diagnosis not present

## 2017-01-11 LAB — CBC
HEMATOCRIT: 34.5 % — AB (ref 40.0–52.0)
HEMOGLOBIN: 11.4 g/dL — AB (ref 13.0–18.0)
MCH: 24.1 pg — ABNORMAL LOW (ref 26.0–34.0)
MCHC: 33.2 g/dL (ref 32.0–36.0)
MCV: 72.8 fL — ABNORMAL LOW (ref 80.0–100.0)
Platelets: 232 10*3/uL (ref 150–440)
RBC: 4.74 MIL/uL (ref 4.40–5.90)
RDW: 13.9 % (ref 11.5–14.5)
WBC: 9.3 10*3/uL (ref 3.8–10.6)

## 2017-01-11 LAB — COMPREHENSIVE METABOLIC PANEL
ALT: 17 U/L (ref 17–63)
AST: 30 U/L (ref 15–41)
Albumin: 4.5 g/dL (ref 3.5–5.0)
Alkaline Phosphatase: 42 U/L (ref 38–126)
Anion gap: 11 (ref 5–15)
BILIRUBIN TOTAL: 0.6 mg/dL (ref 0.3–1.2)
BUN: <5 mg/dL — ABNORMAL LOW (ref 6–20)
CHLORIDE: 85 mmol/L — AB (ref 101–111)
CO2: 24 mmol/L (ref 22–32)
CREATININE: 0.84 mg/dL (ref 0.61–1.24)
Calcium: 8.9 mg/dL (ref 8.9–10.3)
GFR calc non Af Amer: >60 mL/min (ref >60)
Glucose, Bld: 115 mg/dL — ABNORMAL HIGH (ref 65–99)
POTASSIUM: 3 mmol/L — AB (ref 3.5–5.1)
Sodium: 120 mmol/L — ABNORMAL LOW (ref 135–145)
TOTAL PROTEIN: 7.3 g/dL (ref 6.5–8.1)

## 2017-01-11 LAB — ETHANOL

## 2017-01-11 LAB — URINE DRUG SCREEN, QUALITATIVE (ARMC ONLY)
AMPHETAMINES, UR SCREEN: NOT DETECTED
Barbiturates, Ur Screen: NOT DETECTED
Benzodiazepine, Ur Scrn: NOT DETECTED
COCAINE METABOLITE, UR ~~LOC~~: NOT DETECTED
Cannabinoid 50 Ng, Ur ~~LOC~~: NOT DETECTED
MDMA (ECSTASY) UR SCREEN: NOT DETECTED
METHADONE SCREEN, URINE: NOT DETECTED
OPIATE, UR SCREEN: NOT DETECTED
Phencyclidine (PCP) Ur S: NOT DETECTED
Tricyclic, Ur Screen: NOT DETECTED

## 2017-01-11 LAB — ACETAMINOPHEN LEVEL: Acetaminophen (Tylenol), Serum: <10 ug/mL — ABNORMAL LOW (ref 10–30)

## 2017-01-11 LAB — SALICYLATE LEVEL

## 2017-01-11 MED ORDER — DIVALPROEX SODIUM 500 MG PO DR TAB
1000.0000 mg | DELAYED_RELEASE_TABLET | Freq: Two times a day (BID) | ORAL | Status: DC
  Administered 2017-01-11 – 2017-01-12 (×2): 1000 mg via ORAL
  Filled 2017-01-11 (×3): qty 2

## 2017-01-11 MED ORDER — POTASSIUM CHLORIDE CRYS ER 20 MEQ PO TBCR
40.0000 meq | EXTENDED_RELEASE_TABLET | Freq: Two times a day (BID) | ORAL | Status: AC
  Administered 2017-01-11 – 2017-01-12 (×2): 40 meq via ORAL
  Filled 2017-01-11 (×2): qty 2

## 2017-01-11 NOTE — ED Notes (Signed)
Patient from [facility]. EMS reports that patient attempted to jump out of the ambulance while it was in motion.  EMS reports that patient is having a schizophrenic episode

## 2017-01-11 NOTE — ED Provider Notes (Signed)
[facility] Emergency Department Provider Note   ____________________________________________   First MD Initiated Contact with Patient 01/11/17 1905     (approximate)  I have reviewed the triage vital signs and the nursing notes.   HISTORY  Chief Complaint Psychiatric Evaluation  History limited by patient not cooperating HPI John Copeland is a 26 y.o. male Was here yesterday comes in again today because he says he feels weird after smoking a cigarette. His group home sent in for psych eval. EMS reports on the way here he almost jumped out of the back of the ambulance. Patient does not want to talk to me at all when I see him he appears to be reacting to internal stimuli. Dr. Mat Carne packs a seen him and feels he should probably be admitted.  Past Medical History:  Diagnosis Date  . Anxiety   . Depression   . Diabetes mellitus without complication (HCC)   . Schizophrenia [facility])     Patient Active Problem List   Diagnosis Date Noted  . Hyponatremia 01/11/2017  . Diphenhydramine overdose of undetermined intent 01/10/2017  . Tobacco use disorder 10/07/2016  . Diabetes (HCC) 10/07/2016  . HTN (hypertension) 10/07/2016  . Dyslipidemia 10/07/2016  . Schizoaffective disorder, bipolar type (HCC) 11/30/2015    History reviewed. No pertinent surgical history.  Prior to Admission medications   Medication Sig Start Date End Date Taking? Authorizing Provider  benztropine (COGENTIN) 1 MG tablet Take 1 tablet by mouth daily. 12/18/16 12/18/17 Yes [provider]  atorvastatin (LIPITOR) 10 MG tablet Take 1 tablet (10 mg total) by mouth daily at 6 PM. 10/05/16   Clapacs, Jackquline Denmark, MD  diphenhydrAMINE (BENADRYL) 50 MG capsule Take 1 capsule (50 mg total) by mouth at bedtime. 10/12/16   Jimmy Footman, MD  divalproex (DEPAKOTE ER) 500 MG 24 hr tablet Take 2 tablets (1,000 mg total) by mouth 2 (two) times daily. 01/10/17   Clapacs, Jackquline Denmark, MD  haloperidol  (HALDOL) 5 MG tablet Take 1 tablet (5 mg total) by mouth 3 (three) times daily. 10/12/16   Jimmy Footman, MD  haloperidol decanoate (HALDOL DECANOATE) 100 MG/ML injection Inject 1.5 mLs (150 mg total) into the muscle every 28 (twenty-eight) days. 02/07/17   Clapacs, Jackquline Denmark, MD  lisinopril (PRINIVIL,ZESTRIL) 10 MG tablet Take 1 tablet (10 mg total) by mouth daily. 10/06/16   Clapacs, Jackquline Denmark, MD  metFORMIN (GLUCOPHAGE) 1000 MG tablet Take 1 tablet (1,000 mg total) by mouth 2 (two) times daily with a meal. 10/05/16   Clapacs, Jackquline Denmark, MD  Multiple Vitamin (MULTIVITAMIN WITH MINERALS) TABS tablet Take 1 tablet by mouth daily.    [provider]  ondansetron (ZOFRAN) 4 MG tablet Take 1 tablet (4 mg total) by mouth every 8 (eight) hours as needed for nausea or vomiting. 12/29/16   Don Perking, [place], MD  traZODone (DESYREL) 100 MG tablet Take 1 tablet (100 mg total) by mouth at bedtime. 10/09/16   Jimmy Footman, MD  Vitamin D, Ergocalciferol, (DRISDOL) 50000 units CAPS capsule Take 50,000 Units by mouth every 7 (seven) days.    [provider]    Allergies Patient has no known allergies.  History reviewed. No pertinent family history.  Social History Social History  Substance Use Topics  . Smoking status: Current Every Day Smoker    Packs/day: 0.50    Types: Cigarettes  . Smokeless tobacco: Never Used  . Alcohol use No    Review of Systems  patient refuses to answer  ____________________________________________   PHYSICAL EXAM:  VITAL SIGNS: ED Triage Vitals [01/11/17 1818]  Enc Vitals Group     BP (!) 177/94     Pulse Rate (!) 109     Resp (!) 22     Temp 98.2 F (36.8 C)     Temp Source Oral     SpO2 99 %     Weight 206 lb (93.4 kg)     Height 6' (1.829 m)     Head Circumference      Peak Flow      Pain Score      Pain Loc      Pain Edu?      Excl. in GC?     Constitutional: Alert and oriented. Well appearing and in no acute  distress. Eyes: Conjunctivae are normal.  Head: Atraumatic. Nose: No congestion/rhinnorhea. Mouth/Throat: Mucous membranes are moist.  Oropharynx non-erythematous. Neck: No stridor.   Cardiovascular: Normal rate, regular rhythm. Grossly normal heart sounds.  Good peripheral circulation. Respiratory: Normal respiratory effort.  No retractions. Lungs CTAB. Gastrointestinal: Soft and nontender. No distention. No abdominal bruits. No CVA tenderness. Musculoskeletal: No lower extremity tenderness nor edema.  No joint effusions. Neurologic:  Normal speech and language. No gross focal neurologic deficits are appreciated. No gait instability. Skin:  Skin is warm, dry and intact. No rash noted. Psychiatric: Mood and affect are normal. Speech and behavior are normal.  ____________________________________________   LABS (all labs ordered are listed, but only abnormal results are displayed)  Labs Reviewed  COMPREHENSIVE METABOLIC PANEL - Abnormal; Notable for the following:       Result Value   Sodium 120 (*)    Potassium 3.0 (*)    Chloride 85 (*)    Glucose, Bld 115 (*)    BUN <5 (*)    All other components within normal limits  ACETAMINOPHEN LEVEL - Abnormal; Notable for the following:    Acetaminophen (Tylenol), Serum <10 (*)    All other components within normal limits  CBC - Abnormal; Notable for the following:    Hemoglobin 11.4 (*)    HCT 34.5 (*)    MCV 72.8 (*)    MCH 24.1 (*)    All other components within normal limits  ETHANOL  SALICYLATE LEVEL  URINE DRUG SCREEN, QUALITATIVE ([facility] ONLY)  BASIC METABOLIC PANEL   ____________________________________________  EKG   ____________________________________________  RADIOLOGY   ____________________________________________   PROCEDURES  Procedure(s) performed  Procedures  Critical Care performed:   ____________________________________________   INITIAL IMPRESSION / ASSESSMENT AND PLAN / ED COURSE  Pertinent  labs & imaging results that were available during my care of the patient were reviewed by me and considered in my medical decision making (see chart for details).    Clinical Course as of Jan 12 2055  Fri Jan 11, 2017  2008 Salicylate Lvl: <7.0 [PM]    Clinical Course User Index [PM] Arnaldo NatalMalinda, Paul F, MD     ____________________________________________   FINAL CLINICAL IMPRESSION(S) / ED DIAGNOSES  Final diagnoses:  Psychosis, unspecified psychosis type  Hyponatremia      NEW MEDICATIONS STARTED DURING THIS VISIT:  New Prescriptions   No medications on file     Note:  This document was prepared using Dragon voice recognition software and may include unintentional dictation errors.    Arnaldo NatalMalinda, Paul F, MD 01/11/17 2056

## 2017-01-11 NOTE — ED Notes (Signed)
Pt asking for hair cut while here

## 2017-01-11 NOTE — H&P (Signed)
History and Physical   [facility] [place] Ad[facility] History and Physical [employer] Geovanni Copeland, D.O.    Patient Name: John SellsJoshua Cerveny MR#: [id number] Date of Birth: [birth year] Date of Admission: 01/11/2017  Referring MD/NP/PA: Dr. Darnelle CatalanMalinda Primary Care Physician: Patient, No Pcp Per  Chief Complaint:  Chief Complaint  Patient presents with  . Psychiatric Evaluation  Please note the entire history is obtained from the patient's emergency department chart, emergency department provider and records. Patient's personal history is limited by uncooperative with interview.   HPI: John SellsJoshua Copeland is a 26 y.o. male with a known history of anxiety, depression, DM, schizophrenia presents to the emergency department for psychiatric evaluation.  Patient was in a usual state of health until today after he was smoking a cigarette, so his group home sent him for psychiatric evaluation.  .  EMS/ED Course: Patient received depakote. Medical admission has been requested for further management of hyponatremia.   Review of Systems:  Unable to obtain 2/2 uncooperative with interview.  Refusing to answer questions.   Past Medical History:  Diagnosis Date  . Anxiety   . Depression   . Diabetes mellitus without complication (HCC)   . Schizophrenia (HCC)     History reviewed. No pertinent surgical history.   reports that he has been smoking Cigarettes.  He has been smoking about 0.50 packs per day. He has never used smokeless tobacco. He reports that he does not drink alcohol or use drugs.  No Known Allergies  History reviewed. No pertinent family history.  Prior to Admission medications   Medication Sig Start Date End Date Taking? Authorizing Provider  benztropine (COGENTIN) 1 MG tablet Take 1 tablet by mouth daily. 12/18/16 12/18/17 Yes [provider]  atorvastatin (LIPITOR) 10 MG tablet Take 1 tablet (10 mg total) by mouth daily at 6 PM. 10/05/16   Clapacs, Jackquline DenmarkJohn T, MD   diphenhydrAMINE (BENADRYL) 50 MG capsule Take 1 capsule (50 mg total) by mouth at bedtime. 10/12/16   Jimmy FootmanHernandez-Gonzalez, Andrea, MD  divalproex (DEPAKOTE ER) 500 MG 24 hr tablet Take 2 tablets (1,000 mg total) by mouth 2 (two) times daily. 01/10/17   Clapacs, Jackquline DenmarkJohn T, MD  haloperidol (HALDOL) 5 MG tablet Take 1 tablet (5 mg total) by mouth 3 (three) times daily. 10/12/16   Jimmy FootmanHernandez-Gonzalez, Andrea, MD  haloperidol decanoate (HALDOL DECANOATE) 100 MG/ML injection Inject 1.5 mLs (150 mg total) into the muscle every 28 (twenty-eight) days. 02/07/17   Clapacs, Jackquline DenmarkJohn T, MD  lisinopril (PRINIVIL,ZESTRIL) 10 MG tablet Take 1 tablet (10 mg total) by mouth daily. 10/06/16   Clapacs, Jackquline DenmarkJohn T, MD  metFORMIN (GLUCOPHAGE) 1000 MG tablet Take 1 tablet (1,000 mg total) by mouth 2 (two) times daily with a meal. 10/05/16   Clapacs, Jackquline DenmarkJohn T, MD  Multiple Vitamin (MULTIVITAMIN WITH MINERALS) TABS tablet Take 1 tablet by mouth daily.    [provider]  ondansetron (ZOFRAN) 4 MG tablet Take 1 tablet (4 mg total) by mouth every 8 (eight) hours as needed for nausea or vomiting. 12/29/16   Don PerkingVeronese, [place], MD  traZODone (DESYREL) 100 MG tablet Take 1 tablet (100 mg total) by mouth at bedtime. 10/09/16   Jimmy FootmanHernandez-Gonzalez, Andrea, MD  Vitamin D, Ergocalciferol, (DRISDOL) 50000 units CAPS capsule Take 50,000 Units by mouth every 7 (seven) days.    [provider]    Physical Exam: Vitals:   01/11/17 1818  BP: (!) 177/94  Pulse: (!) 109  Resp: (!) 22  Temp: 98.2 F (36.8 C)  TempSrc: Oral  SpO2: 99%  Weight: 93.4 kg (206 lb)  Height: 6' (1.829 m)    GENERAL: 26 y.o.-year-old male patient, well-developed, well-nourished lying in the bed in no acute distress.  Awake, alert, making eye contact.  Answers some questions by nodding head yes or no.  HEENT: Head atraumatic, normocephalic. Pupils equal. Mucus membranes moist. NECK: Unable to assess CHEST: Normal breath sounds bilaterally. No wheezing, rales,  rhonchi or crackles. No use of accessory muscles of respiration.  No reproducible chest wall tenderness.  CARDIOVASCULAR: S1, S2 normal. No murmurs, rubs, or gallops. Cap refill <2 seconds. Pulses intact distally.  ABDOMEN: Unable to assess.  EXTREMITIES: No pedal edema, cyanosis, or clubbing. No calf tenderness or Homan's sign.  NEUROLOGIC: The patient is alert and awake. Follows commands.  PSYCHIATRIC:  Flattened affect. SKIN: Unable to fully assess.     Labs on Admission:  CBC:  Recent Labs Lab 01/11/17 1816  WBC 9.3  HGB 11.4*  HCT 34.5*  MCV 72.8*  PLT 232   Basic Metabolic Panel:  Recent Labs Lab 01/10/17 1538 01/10/17 1930 01/11/17 1816  NA 128* 135 120*  K 3.5 3.3* 3.0*  CL 94* 102 85*  CO2 24 26 24   GLUCOSE 86 52* 115*  BUN 6 <5* <5*  CREATININE 1.11 0.68 0.84  CALCIUM 9.1 8.3* 8.9   GFR: Estimated Creatinine Clearance: 158.1 mL/min (by C-G formula based on SCr of 0.84 mg/dL). Liver Function Tests:  Recent Labs Lab 01/10/17 1538 01/11/17 1816  AST 29 30  ALT 18 17  ALKPHOS 45 42  BILITOT 0.5 0.6  PROT 7.0 7.3  ALBUMIN 4.2 4.5   No results for input(s): LIPASE, AMYLASE in the last 168 hours. No results for input(s): AMMONIA in the last 168 hours. Coagulation Profile: No results for input(s): INR, PROTIME in the last 168 hours. Cardiac Enzymes: No results for input(s): CKTOTAL, CKMB, CKMBINDEX, TROPONINI in the last 168 hours. BNP (last 3 results) No results for input(s): PROBNP in the last 8760 hours. HbA1C: No results for input(s): HGBA1C in the last 72 hours. CBG: No results for input(s): GLUCAP in the last 168 hours. Lipid Profile: No results for input(s): CHOL, HDL, LDLCALC, TRIG, CHOLHDL, LDLDIRECT in the last 72 hours. Thyroid Function Tests: No results for input(s): TSH, T4TOTAL, FREET4, T3FREE, THYROIDAB in the last 72 hours. Anemia Panel: No results for input(s): VITAMINB12, FOLATE, FERRITIN, TIBC, IRON, RETICCTPCT in the last 72  hours. Urine analysis:    Component Value Date/Time   COLORURINE YELLOW (A) 12/28/2016 2039   APPEARANCEUR CLEAR (A) 12/28/2016 2039   LABSPEC 1.026 12/28/2016 2039   PHURINE 6.0 12/28/2016 2039   GLUCOSEU NEGATIVE 12/28/2016 2039   HGBUR NEGATIVE 12/28/2016 2039   BILIRUBINUR NEGATIVE 12/28/2016 2039   KETONESUR NEGATIVE 12/28/2016 2039   PROTEINUR NEGATIVE 12/28/2016 2039   NITRITE NEGATIVE 12/28/2016 2039   LEUKOCYTESUR NEGATIVE 12/28/2016 2039   Sepsis Labs: @LABRCNTIP (procalcitonin:4,lacticidven:4) )No results found for this or any previous visit (from the past 240 hour(s)).   Radiological Exams on Admission: Dg Chest 2 View  Result Date: 01/10/2017 CLINICAL DATA:  Overdose on cough and cold medicine. Cold symptoms for the past several days. EXAM: CHEST  2 VIEW COMPARISON:  12/28/2016 FINDINGS: The heart size and mediastinal contours are within normal limits. Both lungs are clear. The visualized skeletal structures are unremarkable. IMPRESSION: No active cardiopulmonary disease. Electronically Signed   By: Tollie Eth M.D.   On: 01/10/2017 19:33    EKG: Sinus at  82bpm, ST segment elevations diffusely, II, III, VF, V3-4-5.   Assessment/Plan  This is a 26 y.o. male with a history of anxiety, depression, DM, schizophrenia now being admitted with:  #. Hyponatremia - Admit observation, telemetry - Fluid restriction - Recheck BMP at 0000 and in AM - Check serum osm, UA and urine osm - Cardiac monitoring - Pharmacy consult to check for drug induced hyponatremia  #. Hypokalemia - Replace PO and recheck BMP in AM - Check mag level  #. Abnormal EKG, chest pain free. - Repeat EKG.  Had previous minor ST segment elevations in lateral leads.  - Trend troponins  #. History of schizophrenia - Continue Cogentin, Depakote, Benadryl, Haldol, trazodone - IVC - 1:1 safety sitter - Psychiatry following, plan to transfer downstairs when medically cleared.   #. History of  hyperlipidemia - Continue Lipitor  #. History of diabetes - Continue lisinopril -Accu-Cheks before meals and at bedtime with regular insulin sliding scale coverage - Hold metformin - Carb Controlled diet  Admission status: Observation IV Fluids: Hep-Lock Diet/Nutrition: carb controlled Consults called: psychiatric  DVT Px: SCDs and early ambulation. Code Status: Full Code  Disposition Plan: To inpatient psych when medically cleared.   All the records are reviewed and case discussed with ED provider. Management plans discussed with the patient and/or family who express understanding and agree with plan of care.  Toryn Mcclinton D.O. on 01/11/2017 at 10:10 PM Between 7am to 6pm - Pager - [phone] After 6pm go to [email or web address] - password [email or web address] [facility] [place] [facility] Office [phone] CC: Primary care physician; Patient, No Pcp Per   01/11/2017, 10:10 PM

## 2017-01-11 NOTE — Consult Note (Signed)
[facility] Face-to-Face Psychiatry Consult   Reason for Consult:  Consult for 26 year old man with a history of schizoaffective disorder or schizophrenia brought into the hospital because of odd behavior Referring Physician:  Darnelle Catalan Patient Identification: John Copeland MRN:  [id number] Principal Diagnosis: Schizoaffective disorder, bipolar type [name]:   Patient Active Problem List   Diagnosis Date Noted  . Hyponatremia [E87.1] 01/11/2017  . Diphenhydramine overdose of undetermined intent [T45.0X4A] 01/10/2017  . Tobacco use disorder [F17.200] 10/07/2016  . Diabetes (HCC) [E11.9] 10/07/2016  . HTN (hypertension) [I10] 10/07/2016  . Dyslipidemia [E78.5] 10/07/2016  . Schizoaffective disorder, bipolar type (HCC) [F25.0] 11/30/2015    Total Time spent with patient: 1 hour  Subjective:   John Copeland is a 26 y.o. male patient admitted with patient would not talk to me.  HPI:  Attempted to interview patient. He did talk to me a very little but mostly just asked me to bring him some grape juice. Chart reviewed. Patient apparently was brought in because of bizarre behavior and agitation. It's noted in one of the nursing notes that he had talked about how he was feeling very strange after smoking a cigarette. When I saw the patient in his room he was awake and made eye contact with me but wouldn't answer any of my questions. He was calm at that time but after I brought him some juice he made quite a mess with it in the next thing I saw he was on his knees reading a Bible and talking to himself and again refusing to answer my questions. This young man has a history of chronic mental illness. His act team had noticed that he had been decompensating somewhat recently and seemed to be getting more manic. Last time I talked with him was yesterday and at that time he was clearly hyper religious however yesterday he did let us give him a Haldol decanoate shot. He denies that he has used any drugs.  Yesterday he had come to the hospital after taking an entire box of cough and cold medicine. I ask him if he had done the same thing again today and he denied it.  Social history: Patient lives in a group home. Relatively new to our area. Not known whether he has family actively involved or not. It does appear however he has a legal guardian who is not a family member.  Medical history: Patient has mild diabetes by history and hypertension although his blood sugars here recently have looked pretty good. When we did routine labs today however, his sodium came back at 120. This is compared to the 135 he had yesterday. I also noticed that his hemoglobin and hematocrit have dropped fairly dramatically in the last 10 days given that there is no reason to think that he is bleeding. I asked the patient whether he had been drinking very large amounts of fluid and he would not answer me.  Substance abuse history: Yesterday he came in after taking a whole box of cough and cold medicine although he insisted he was not trying to get high. Unclear to me how much of a past substance abuse problem he has had.  Past Psychiatric History: Diagnosis of schizoaffective disorder and the medicines most recently had been Depakote and Haldol. Has had multiple hospitalizations. There seems to be a mixture of both real psychotic symptoms and disorganized behavior along with episodes where it was suspected that he could've been exaggerating to get out of a group home situation. It seems clear  to me today that he has real mental illness. Not known whether he is ever tried to harm himself in the past. Has not been aggressive to [place] at least here.  Risk to Self: Is patient at risk for suicide?: No Risk to Others:   Prior Inpatient Therapy:   Prior Outpatient Therapy:    Past Medical History:  Past Medical History:  Diagnosis Date  . Anxiety   . Depression   . Diabetes mellitus without complication (HCC)   . Schizophrenia  (HCC)    History reviewed. No pertinent surgical history. Family History: History reviewed. No pertinent family history. Family Psychiatric  History: Unknown Social History:  History  Alcohol Use No     History  Drug Use No    Social History   Social History  . Marital status: Single    Spouse name: N/A  . Number of children: N/A  . Years of education: N/A   Social History Main Topics  . Smoking status: Current Every Day Smoker    Packs/day: 0.50    Types: Cigarettes  . Smokeless tobacco: Never Used  . Alcohol use No  . Drug use: No  . Sexual activity: Not Asked   Other Topics Concern  . None   Social History Narrative  . None   Additional Social History:    Allergies:  No Known Allergies  Labs:  Results for orders placed or performed during the hospital encounter of 01/11/17 (from the past 48 hour(s))  Ethanol     Status: None   Collection Time: 01/11/17  6:16 PM  Result Value Ref Range   Alcohol, Ethyl (B) <5 <5 mg/dL    Comment:        LOWEST DETECTABLE LIMIT FOR SERUM ALCOHOL IS 5 mg/dL FOR MEDICAL PURPOSES ONLY   Salicylate level     Status: None   Collection Time: 01/11/17  6:16 PM  Result Value Ref Range   Salicylate Lvl <7.0 2.8 - 30.0 mg/dL  Acetaminophen level     Status: Abnormal   Collection Time: 01/11/17  6:16 PM  Result Value Ref Range   Acetaminophen (Tylenol), Serum <10 (L) 10 - 30 ug/mL    Comment:        THERAPEUTIC CONCENTRATIONS VARY SIGNIFICANTLY. A RANGE OF 10-30 ug/mL MAY BE AN EFFECTIVE CONCENTRATION FOR MANY PATIENTS. HOWEVER, SOME ARE BEST TREATED AT CONCENTRATIONS OUTSIDE THIS RANGE. ACETAMINOPHEN CONCENTRATIONS >150 ug/mL AT 4 HOURS AFTER INGESTION AND >50 ug/mL AT 12 HOURS AFTER INGESTION ARE OFTEN ASSOCIATED WITH TOXIC REACTIONS.   cbc     Status: Abnormal   Collection Time: 01/11/17  6:16 PM  Result Value Ref Range   WBC 9.3 3.8 - 10.6 K/uL   RBC 4.74 4.40 - 5.90 MIL/uL   Hemoglobin 11.4 (L) 13.0 - 18.0 g/dL    HCT 16.1 (L) 09.6 - 52.0 %   MCV 72.8 (L) 80.0 - 100.0 fL   MCH 24.1 (L) 26.0 - 34.0 pg   MCHC 33.2 32.0 - 36.0 g/dL   RDW 04.5 40.9 - 81.1 %   Platelets 232 150 - 440 K/uL  Urine Drug Screen, Qualitative     Status: None   Collection Time: 01/11/17  6:16 PM  Result Value Ref Range   Tricyclic, Ur Screen NONE DETECTED NONE DETECTED   Amphetamines, Ur Screen NONE DETECTED NONE DETECTED   MDMA (Ecstasy)Ur Screen NONE DETECTED NONE DETECTED   Cocaine Metabolite,Ur [place] NONE DETECTED NONE DETECTED   Opiate, Ur Screen NONE DETECTED  NONE DETECTED   Phencyclidine (PCP) Ur S NONE DETECTED NONE DETECTED   Cannabinoid 50 Ng, Ur [place] NONE DETECTED NONE DETECTED   Barbiturates, Ur Screen NONE DETECTED NONE DETECTED   Benzodiazepine, Ur Scrn NONE DETECTED NONE DETECTED   Methadone Scn, Ur NONE DETECTED NONE DETECTED    Comment: (NOTE) 100  Tricyclics, urine               Cutoff 1000 ng/mL 200  Amphetamines, urine             Cutoff 1000 ng/mL 300  MDMA (Ecstasy), urine           Cutoff 500 ng/mL 400  Cocaine Metabolite, urine       Cutoff 300 ng/mL 500  Opiate, urine                   Cutoff 300 ng/mL 600  Phencyclidine (PCP), urine      Cutoff 25 ng/mL 700  Cannabinoid, urine              Cutoff 50 ng/mL 800  Barbiturates, urine             Cutoff 200 ng/mL 900  Benzodiazepine, urine           Cutoff 200 ng/mL 1000 Methadone, urine                Cutoff 300 ng/mL 1100 1200 The urine drug screen provides only a preliminary, unconfirmed 1300 analytical test result and should not be used for non-medical 1400 purposes. Clinical consideration and professional judgment should 1500 be applied to any positive drug screen result due to possible 1600 interfering substances. A more specific alternate chemical method 1700 must be used in order to obtain a confirmed analytical result.  1800 Gas chromato graphy / mass spectrometry (GC/MS) is the preferred 1900 confirmatory method.     Current  Facility-Administered Medications  Medication Dose Route Frequency Provider Last Rate Last Dose  . divalproex (DEPAKOTE) DR tablet 1,000 mg  1,000 mg Oral Q12H Flor Houdeshell, Jackquline Denmark, MD       Current Outpatient Prescriptions  Medication Sig Dispense Refill  . benztropine (COGENTIN) 1 MG tablet Take 1 tablet by mouth daily.    Marland Kitchen atorvastatin (LIPITOR) 10 MG tablet Take 1 tablet (10 mg total) by mouth daily at 6 PM. 7 tablet 0  . diphenhydrAMINE (BENADRYL) 50 MG capsule Take 1 capsule (50 mg total) by mouth at bedtime. 30 capsule 0  . divalproex (DEPAKOTE ER) 500 MG 24 hr tablet Take 2 tablets (1,000 mg total) by mouth 2 (two) times daily. 120 tablet 0  . haloperidol (HALDOL) 5 MG tablet Take 1 tablet (5 mg total) by mouth 3 (three) times daily. 90 tablet 0  . [START ON 02/07/2017] haloperidol decanoate (HALDOL DECANOATE) 100 MG/ML injection Inject 1.5 mLs (150 mg total) into the muscle every 28 (twenty-eight) days. 1 mL 0  . lisinopril (PRINIVIL,ZESTRIL) 10 MG tablet Take 1 tablet (10 mg total) by mouth daily. 7 tablet 0  . metFORMIN (GLUCOPHAGE) 1000 MG tablet Take 1 tablet (1,000 mg total) by mouth 2 (two) times daily with a meal. 14 tablet 0  . Multiple Vitamin (MULTIVITAMIN WITH MINERALS) TABS tablet Take 1 tablet by mouth daily.    . ondansetron (ZOFRAN) 4 MG tablet Take 1 tablet (4 mg total) by mouth every 8 (eight) hours as needed for nausea or vomiting. 20 tablet 0  . traZODone (DESYREL) 100 MG tablet Take 1 tablet (  100 mg total) by mouth at bedtime. 30 tablet 0  . Vitamin D, Ergocalciferol, (DRISDOL) 50000 units CAPS capsule Take 50,000 Units by mouth every 7 (seven) days.      Musculoskeletal: Strength & Muscle Tone: within normal limits Gait & Station: normal Patient leans: N/A  Psychiatric Specialty Exam: Physical Exam  Nursing note and vitals reviewed. Constitutional: He appears well-developed and well-nourished.  HENT:  Head: Normocephalic and atraumatic.  Eyes: Pupils are equal,  round, and reactive to light. Conjunctivae are normal.  Neck: Normal range of motion.  Cardiovascular: Normal heart sounds.   Respiratory: Effort normal.  GI: Soft.  Musculoskeletal: Normal range of motion.  Neurological: He is alert.  Skin: Skin is warm and dry.  Psychiatric: His affect is blunt. He is withdrawn. Cognition and memory are impaired. He is noncommunicative. He is inattentive.    Review of Systems  Unable to perform ROS: Psychiatric disorder    Blood pressure (!) 177/94, pulse (!) 109, temperature 98.2 F (36.8 C), temperature source Oral, resp. rate (!) 22, height 6' (1.829 m), weight 206 lb (93.4 kg), SpO2 99 %.Body mass index is 27.94 kg/m.  General Appearance: Bizarre and The patient has a large patch on the right side of his head where the hair is missing. This is a change from yesterday and it looks very much like he was trying to shave his own head  Eye Contact:  Fair  Speech:  He would barely talk to me  Volume:  Decreased  Mood:  Negative  Affect:  Blunt  Thought Process:  NA  Orientation:  Negative  Thought Content:  Negative  Suicidal Thoughts:  Unknown but is not acting suicidal  Homicidal Thoughts:  Presumably not. He has not been aggressive or threatening  Memory:  Immediate;   Negative Recent;   Negative Remote;   Negative  Judgement:  Negative  Insight:  Negative  Psychomotor Activity:  Mannerisms  Concentration:  Concentration: Poor  Recall:  Negative  Fund of Knowledge:  Negative  Language:  Negative  Akathisia:  I had wondered initially whether he could be having a reaction to the Haldol shot yesterday but I examined him and there is no sign at all of any stiffness in his muscles and he does not appear to have any akathisia  Handed:  Right  AIMS (if indicated):     Assets:  Social Support  ADL's:  Impaired  Cognition:  Impaired,  Mild  Sleep:        Treatment Plan Summary: Plan 26 year old man with schizophrenia or schizoaffective  disorder who presented to the hospital today and is significantly more psychotic state then he was yesterday. Acting bizarre when I saw him today. Seems to of decompensated any worse. He does not appear to be having any EPS or akathisia. I had planned initially to admit him directly to the psychiatric service until I was informed that his sodium came back at 120. I am going to have them rechecked that but if it is still pathologically low I am not comfortable with him coming downstairs. My guess would be the most likely explanation is water intoxication although he will not admit it. Case reviewed with emergency room physician. I will sign this out to the psychiatrist who is going to be on call for the behavioral health unit for the weekend. I have also spoken with TTS. If it turns out he is medically stable we can then proceed with admission.  Disposition: Recommend psychiatric Inpatient admission  when medically cleared. Supportive therapy provided about ongoing stressors.  Mordecai Rasmussen, MD 01/11/2017 7:24 PM

## 2017-01-11 NOTE — ED Triage Notes (Signed)
See first nurse note. Pt reports here because feels weird after smoking cigarette. Group home sent for psych eval. Pt reports was scared and that is why he tried to jump out of the ambulance. Tearful and keeps talking about the cigarette in triage.

## 2017-01-12 ENCOUNTER — Encounter: Payer: Self-pay | Admitting: *Deleted

## 2017-01-12 ENCOUNTER — Inpatient Hospital Stay
Admission: AD | Admit: 2017-01-12 | Discharge: 2017-01-21 | DRG: 918 | Disposition: A | Discharge status: Home / Self Care | Payer: Medicaid Other | Attending: Psychiatry | Admitting: Psychiatry

## 2017-01-12 DIAGNOSIS — T450X1A Poisoning by antiallergic and antiemetic drugs, accidental (unintentional), initial encounter: Secondary | ICD-10-CM | POA: Diagnosis present

## 2017-01-12 DIAGNOSIS — I10 Essential (primary) hypertension: Secondary | ICD-10-CM | POA: Diagnosis present

## 2017-01-12 DIAGNOSIS — E119 Type 2 diabetes mellitus without complications: Secondary | ICD-10-CM | POA: Diagnosis present

## 2017-01-12 DIAGNOSIS — F250 Schizoaffective disorder, bipolar type: Secondary | ICD-10-CM | POA: Diagnosis not present

## 2017-01-12 DIAGNOSIS — R631 Polydipsia: Secondary | ICD-10-CM | POA: Diagnosis present

## 2017-01-12 DIAGNOSIS — F419 Anxiety disorder, unspecified: Secondary | ICD-10-CM | POA: Diagnosis present

## 2017-01-12 DIAGNOSIS — F17200 Nicotine dependence, unspecified, uncomplicated: Secondary | ICD-10-CM | POA: Diagnosis present

## 2017-01-12 DIAGNOSIS — Z590 Homelessness: Secondary | ICD-10-CM | POA: Diagnosis not present

## 2017-01-12 DIAGNOSIS — E871 Hypo-osmolality and hyponatremia: Secondary | ICD-10-CM | POA: Diagnosis present

## 2017-01-12 DIAGNOSIS — Z794 Long term (current) use of insulin: Secondary | ICD-10-CM | POA: Diagnosis not present

## 2017-01-12 DIAGNOSIS — T450X4A Poisoning by antiallergic and antiemetic drugs, undetermined, initial encounter: Secondary | ICD-10-CM | POA: Diagnosis present

## 2017-01-12 DIAGNOSIS — E785 Hyperlipidemia, unspecified: Secondary | ICD-10-CM | POA: Diagnosis present

## 2017-01-12 DIAGNOSIS — Z9119 Patient's noncompliance with other medical treatment and regimen: Secondary | ICD-10-CM

## 2017-01-12 DIAGNOSIS — K5900 Constipation, unspecified: Secondary | ICD-10-CM | POA: Diagnosis present

## 2017-01-12 DIAGNOSIS — F17210 Nicotine dependence, cigarettes, uncomplicated: Secondary | ICD-10-CM | POA: Diagnosis present

## 2017-01-12 DIAGNOSIS — Z79899 Other long term (current) drug therapy: Secondary | ICD-10-CM | POA: Diagnosis not present

## 2017-01-12 DIAGNOSIS — G4700 Insomnia, unspecified: Secondary | ICD-10-CM | POA: Diagnosis present

## 2017-01-12 LAB — CBC
HCT: 39.2 % — ABNORMAL LOW (ref 40.0–52.0)
HEMOGLOBIN: 12.8 g/dL — AB (ref 13.0–18.0)
MCH: 24.1 pg — ABNORMAL LOW (ref 26.0–34.0)
MCHC: 32.7 g/dL (ref 32.0–36.0)
MCV: 73.8 fL — ABNORMAL LOW (ref 80.0–100.0)
PLATELETS: 248 10*3/uL (ref 150–440)
RBC: 5.32 MIL/uL (ref 4.40–5.90)
RDW: 13.7 % (ref 11.5–14.5)
WBC: 4.7 10*3/uL (ref 3.8–10.6)

## 2017-01-12 LAB — GLUCOSE, CAPILLARY
GLUCOSE-CAPILLARY: 108 mg/dL — AB (ref 65–99)
Glucose-Capillary: 94 mg/dL (ref 65–99)
Glucose-Capillary: 89 mg/dL (ref 65–99)

## 2017-01-12 LAB — URINALYSIS, ROUTINE W REFLEX MICROSCOPIC
BILIRUBIN URINE: NEGATIVE
GLUCOSE, UA: NEGATIVE mg/dL
HGB URINE DIPSTICK: NEGATIVE
Ketones, ur: NEGATIVE mg/dL
Leukocytes, UA: NEGATIVE
Nitrite: NEGATIVE
PROTEIN: NEGATIVE mg/dL
SPECIFIC GRAVITY, URINE: 1 — AB (ref 1.005–1.030)
pH: 7 (ref 5.0–8.0)

## 2017-01-12 LAB — BASIC METABOLIC PANEL
ANION GAP: 8 (ref 5–15)
Anion gap: 8 (ref 5–15)
BUN: <5 mg/dL — ABNORMAL LOW (ref 6–20)
CALCIUM: 9.3 mg/dL (ref 8.9–10.3)
CO2: 25 mmol/L (ref 22–32)
CO2: 27 mmol/L (ref 22–32)
CREATININE: 0.6 mg/dL — AB (ref 0.61–1.24)
CREATININE: 0.8 mg/dL (ref 0.61–1.24)
Calcium: 9.1 mg/dL (ref 8.9–10.3)
Chloride: 98 mmol/L — ABNORMAL LOW (ref 101–111)
Chloride: 104 mmol/L (ref 101–111)
GFR calc Af Amer: >60 mL/min (ref >60)
GLUCOSE: 103 mg/dL — AB (ref 65–99)
Glucose, Bld: 96 mg/dL (ref 65–99)
POTASSIUM: 3.5 mmol/L (ref 3.5–5.1)
Potassium: 4.1 mmol/L (ref 3.5–5.1)
SODIUM: 131 mmol/L — AB (ref 135–145)
SODIUM: 139 mmol/L (ref 135–145)

## 2017-01-12 LAB — PHOSPHORUS: Phosphorus: 4.5 mg/dL (ref 2.5–4.6)

## 2017-01-12 LAB — TROPONIN I: Troponin I: <0.03 ng/mL (ref <0.03)

## 2017-01-12 LAB — MRSA PCR SCREENING: MRSA BY PCR: NEGATIVE

## 2017-01-12 LAB — OSMOLALITY: Osmolality: 268 mOsm/kg — ABNORMAL LOW (ref 275–295)

## 2017-01-12 LAB — MAGNESIUM: MAGNESIUM: 1.8 mg/dL (ref 1.7–2.4)

## 2017-01-12 LAB — OSMOLALITY, URINE: OSMOLALITY UR: 37 mosm/kg — AB (ref 300–900)

## 2017-01-12 MED ORDER — ACETAMINOPHEN 325 MG PO TABS
650.0000 mg | ORAL_TABLET | Freq: Four times a day (QID) | ORAL | Status: DC | PRN
  Administered 2017-01-18: 650 mg via ORAL
  Filled 2017-01-12: qty 2

## 2017-01-12 MED ORDER — ACETAMINOPHEN 325 MG PO TABS: 650.0000 mg | ORAL_TABLET | Freq: Four times a day (QID) | ORAL | Status: DC | PRN

## 2017-01-12 MED ORDER — DIPHENHYDRAMINE HCL 25 MG PO CAPS
50.0000 mg | ORAL_CAPSULE | Freq: Every day | ORAL | Status: DC
  Administered 2017-01-12: 50 mg via ORAL
  Filled 2017-01-12: qty 2

## 2017-01-12 MED ORDER — LISINOPRIL 10 MG PO TABS
10.0000 mg | ORAL_TABLET | Freq: Every day | ORAL | Status: DC
  Administered 2017-01-13 – 2017-01-21 (×9): 10 mg via ORAL
  Filled 2017-01-12 (×9): qty 1
  Filled 2017-01-12: qty 0.5

## 2017-01-12 MED ORDER — INSULIN ASPART 100 UNIT/ML ~~LOC~~ SOLN: 0.0000 [IU] | Freq: Every day | SUBCUTANEOUS | Status: DC

## 2017-01-12 MED ORDER — ADULT MULTIVITAMIN W/MINERALS CH
1.0000 | ORAL_TABLET | Freq: Every day | ORAL | Status: DC
  Administered 2017-01-13 – 2017-01-21 (×9): 1 via ORAL
  Filled 2017-01-12 (×9): qty 1

## 2017-01-12 MED ORDER — ALBUTEROL SULFATE (2.5 MG/3ML) 0.083% IN NEBU: 2.5000 mg | INHALATION_SOLUTION | Freq: Four times a day (QID) | RESPIRATORY_TRACT | Status: DC | PRN

## 2017-01-12 MED ORDER — ATORVASTATIN CALCIUM 20 MG PO TABS
10.0000 mg | ORAL_TABLET | Freq: Every day | ORAL | Status: DC
  Administered 2017-01-12 – 2017-01-20 (×9): 10 mg via ORAL
  Filled 2017-01-12 (×9): qty 1

## 2017-01-12 MED ORDER — IPRATROPIUM BROMIDE 0.02 % IN SOLN
0.5000 mg | Freq: Four times a day (QID) | RESPIRATORY_TRACT | Status: DC | PRN
  Filled 2017-01-12: qty 2.5

## 2017-01-12 MED ORDER — BENZTROPINE MESYLATE 1 MG PO TABS
1.0000 mg | ORAL_TABLET | Freq: Every day | ORAL | Status: DC
  Administered 2017-01-13 – 2017-01-21 (×9): 1 mg via ORAL
  Filled 2017-01-12 (×9): qty 1

## 2017-01-12 MED ORDER — SENNOSIDES-DOCUSATE SODIUM 8.6-50 MG PO TABS: 1.0000 | ORAL_TABLET | Freq: Every evening | ORAL | Status: DC | PRN

## 2017-01-12 MED ORDER — DIVALPROEX SODIUM ER 500 MG PO TB24
1000.0000 mg | ORAL_TABLET | Freq: Two times a day (BID) | ORAL | Status: DC
  Filled 2017-01-12 (×2): qty 2

## 2017-01-12 MED ORDER — ACETAMINOPHEN 650 MG RE SUPP: 650.0000 mg | Freq: Four times a day (QID) | RECTAL | Status: DC | PRN

## 2017-01-12 MED ORDER — HALOPERIDOL 5 MG PO TABS
5.0000 mg | ORAL_TABLET | Freq: Three times a day (TID) | ORAL | Status: DC
  Administered 2017-01-12: 5 mg via ORAL
  Filled 2017-01-12 (×3): qty 1

## 2017-01-12 MED ORDER — MAGNESIUM HYDROXIDE 400 MG/5ML PO SUSP: 30.0000 mL | Freq: Every day | ORAL | Status: DC | PRN

## 2017-01-12 MED ORDER — MAGNESIUM CITRATE PO SOLN
1.0000 | Freq: Once | ORAL | Status: DC | PRN
  Filled 2017-01-12: qty 296

## 2017-01-12 MED ORDER — DIPHENHYDRAMINE HCL 25 MG PO CAPS
50.0000 mg | ORAL_CAPSULE | Freq: Every day | ORAL | Status: DC
  Administered 2017-01-15 – 2017-01-20 (×6): 50 mg via ORAL
  Filled 2017-01-12 (×7): qty 2

## 2017-01-12 MED ORDER — ONDANSETRON HCL 4 MG/2ML IJ SOLN: 4.0000 mg | Freq: Four times a day (QID) | INTRAMUSCULAR | Status: DC | PRN

## 2017-01-12 MED ORDER — HALOPERIDOL 5 MG PO TABS
5.0000 mg | ORAL_TABLET | Freq: Three times a day (TID) | ORAL | Status: DC
  Administered 2017-01-12 – 2017-01-16 (×11): 5 mg via ORAL
  Filled 2017-01-12 (×11): qty 1

## 2017-01-12 MED ORDER — BISACODYL 5 MG PO TBEC: 5.0000 mg | DELAYED_RELEASE_TABLET | Freq: Every day | ORAL | Status: DC | PRN

## 2017-01-12 MED ORDER — SODIUM CHLORIDE 0.9 % IV SOLN: 250.0000 mL | INTRAVENOUS | Status: DC | PRN

## 2017-01-12 MED ORDER — BENZTROPINE MESYLATE 1 MG PO TABS
1.0000 mg | ORAL_TABLET | Freq: Every day | ORAL | Status: DC
  Administered 2017-01-12: 1 mg via ORAL
  Filled 2017-01-12: qty 1

## 2017-01-12 MED ORDER — ATORVASTATIN CALCIUM 10 MG PO TABS
10.0000 mg | ORAL_TABLET | Freq: Every day | ORAL | Status: DC
Start: 2017-01-12 — End: 2017-01-12

## 2017-01-12 MED ORDER — BISACODYL 5 MG PO TBEC
5.0000 mg | DELAYED_RELEASE_TABLET | Freq: Every day | ORAL | Status: DC | PRN
  Filled 2017-01-12: qty 1

## 2017-01-12 MED ORDER — METFORMIN HCL 500 MG PO TABS
1000.0000 mg | ORAL_TABLET | Freq: Two times a day (BID) | ORAL | Status: DC
  Administered 2017-01-12 – 2017-01-21 (×19): 1000 mg via ORAL
  Filled 2017-01-12 (×20): qty 2

## 2017-01-12 MED ORDER — TRAZODONE HCL 100 MG PO TABS
100.0000 mg | ORAL_TABLET | Freq: Every day | ORAL | Status: DC
  Administered 2017-01-12: 100 mg via ORAL
  Filled 2017-01-12: qty 1

## 2017-01-12 MED ORDER — ADULT MULTIVITAMIN W/MINERALS CH
1.0000 | ORAL_TABLET | Freq: Every day | ORAL | Status: DC
  Administered 2017-01-12: 1 via ORAL
  Filled 2017-01-12: qty 1

## 2017-01-12 MED ORDER — IPRATROPIUM BROMIDE 0.02 % IN SOLN: 0.5000 mg | Freq: Four times a day (QID) | RESPIRATORY_TRACT | Status: DC | PRN

## 2017-01-12 MED ORDER — INSULIN ASPART 100 UNIT/ML ~~LOC~~ SOLN: 0.0000 [IU] | Freq: Three times a day (TID) | SUBCUTANEOUS | Status: DC

## 2017-01-12 MED ORDER — TRAZODONE HCL 100 MG PO TABS
100.0000 mg | ORAL_TABLET | Freq: Every day | ORAL | Status: DC
  Administered 2017-01-15 – 2017-01-20 (×6): 100 mg via ORAL
  Filled 2017-01-12 (×7): qty 1

## 2017-01-12 MED ORDER — SODIUM CHLORIDE 0.9% FLUSH
3.0000 mL | Freq: Two times a day (BID) | INTRAVENOUS | Status: DC
  Administered 2017-01-12: 3 mL via INTRAVENOUS

## 2017-01-12 MED ORDER — ALUM & MAG HYDROXIDE-SIMETH 200-200-20 MG/5ML PO SUSP: 30.0000 mL | ORAL | Status: DC | PRN

## 2017-01-12 MED ORDER — LISINOPRIL 10 MG PO TABS
10.0000 mg | ORAL_TABLET | Freq: Every day | ORAL | Status: DC
  Administered 2017-01-12: 10 mg via ORAL
  Filled 2017-01-12: qty 1

## 2017-01-12 MED ORDER — SODIUM CHLORIDE 0.9% FLUSH: 3.0000 mL | INTRAVENOUS | Status: DC | PRN

## 2017-01-12 MED ORDER — DIVALPROEX SODIUM ER 500 MG PO TB24
1000.0000 mg | ORAL_TABLET | Freq: Two times a day (BID) | ORAL | Status: DC
  Administered 2017-01-12 – 2017-01-19 (×15): 1000 mg via ORAL
  Filled 2017-01-12 (×15): qty 2

## 2017-01-12 MED ORDER — ONDANSETRON HCL 4 MG PO TABS: 4.0000 mg | ORAL_TABLET | Freq: Four times a day (QID) | ORAL | Status: DC | PRN

## 2017-01-12 MED ORDER — ONDANSETRON HCL 4 MG PO TABS: 4.0000 mg | ORAL_TABLET | Freq: Three times a day (TID) | ORAL | Status: DC | PRN

## 2017-01-12 MED ORDER — MAGNESIUM CITRATE PO SOLN: 1.0000 | Freq: Once | ORAL | Status: DC | PRN

## 2017-01-12 MED ORDER — ONDANSETRON HCL 4 MG PO TABS
4.0000 mg | ORAL_TABLET | Freq: Three times a day (TID) | ORAL | Status: DC | PRN
  Filled 2017-01-12: qty 1

## 2017-01-12 NOTE — Progress Notes (Signed)
Pt is stable. VS stable no sign of acute distress[facility]

## 2017-01-12 NOTE — Discharge Summary (Addendum)
[facility] [place] [facility]   PATIENT NAME: John SellsJoshua Skibicki    MR#:  [id number]  DATE OF BIRTH:  April 04, 1991  DATE OF ADMISSION:  01/11/2017 ADMITTING PHYSICIAN: Tonye RoyaltyAlexis Hugelmeyer, DO  DATE OF DISCHARGE: 01/12/2017  PRIMARY CARE PHYSICIAN: Patient, No Pcp Per    ADMISSION DIAGNOSIS:  Hyponatremia [E87.1] Psychosis, unspecified psychosis type [F29]  DISCHARGE DIAGNOSIS:  Principal Problem:   Schizoaffective disorder, bipolar type (HCC) Active Problems:   HTN (hypertension)   Hyponatremia   SECONDARY DIAGNOSIS:   Past Medical History:  Diagnosis Date  . Anxiety   . Depression   . Diabetes mellitus without complication (HCC)   . Schizophrenia [facility](HCC)     HOSPITAL COURSE:   26 year old male with history of schizophrenia and diabetes who presents for psych evaluation and found to have hyponatremia.  1. Hyponatremia: This has corrected with IV fluids.  2. Hypokalemia: This is been repleted and is normalized  3. Schizophrenia: Patient will need to have psychiatric  further evaluation and treatment He had one-to-one safety sitter while hospitalized on the medical floor.  4. Abnormal EKG without chest pain and normal troponins: At this time no further recommendations Likely due hyponatremia.  5. Diabetes: Patient will continue metformin and will need sliding scale insulin coverage and diabetes coordinator consultation once on t  DISCHARGE CONDITIONS AND DIET:   Stable for transfer to psychiatry unit on diabetic diet  CONSULTS OBTAINED:  Treatment Team:  Clapacs, Jackquline DenmarkJohn T, MD  DRUG ALLERGIES:  No Known Allergies  DISCHARGE MEDICATIONS:   Current Discharge Medication List    CONTINUE these medications which have NOT CHANGED   Details  benztropine (COGENTIN) 1 MG tablet Take 1 tablet by mouth daily.    atorvastatin (LIPITOR) 10 MG tablet Take 1 tablet (10 mg total) by mouth daily at 6 PM. Qty: 7 tablet, Refills: 0    diphenhydrAMINE  (BENADRYL) 50 MG capsule Take 1 capsule (50 mg total) by mouth at bedtime. Qty: 30 capsule, Refills: 0    divalproex (DEPAKOTE ER) 500 MG 24 hr tablet Take 2 tablets (1,000 mg total) by mouth 2 (two) times daily. Qty: 120 tablet, Refills: 0    haloperidol (HALDOL) 5 MG tablet Take 1 tablet (5 mg total) by mouth 3 (three) times daily. Qty: 90 tablet, Refills: 0    haloperidol decanoate (HALDOL DECANOATE) 100 MG/ML injection Inject 1.5 mLs (150 mg total) into the muscle every 28 (twenty-eight) days. Qty: 1 mL, Refills: 0    lisinopril (PRINIVIL,ZESTRIL) 10 MG tablet Take 1 tablet (10 mg total) by mouth daily. Qty: 7 tablet, Refills: 0    metFORMIN (GLUCOPHAGE) 1000 MG tablet Take 1 tablet (1,000 mg total) by mouth 2 (two) times daily with a meal. Qty: 14 tablet, Refills: 0    Multiple Vitamin (MULTIVITAMIN WITH MINERALS) TABS tablet Take 1 tablet by mouth daily.    ondansetron (ZOFRAN) 4 MG tablet Take 1 tablet (4 mg total) by mouth every 8 (eight) hours as needed for nausea or vomiting. Qty: 20 tablet, Refills: 0    traZODone (DESYREL) 100 MG tablet Take 1 tablet (100 mg total) by mouth at bedtime. Qty: 30 tablet, Refills: 0    Vitamin D, Ergocalciferol, (DRISDOL) 50000 units CAPS capsule Take 50,000 Units by mouth every 7 (seven) days.          Today   CHIEF COMPLAINT:  Patient does not speak he only nods his head occasionally has no complaints this time   VITAL SIGNS:  Blood pressure 128/76, pulse 77, temperature 98.2 F (36.8 C), temperature source Oral, resp. rate 17, height 6' (1.829 m), weight 93.4 kg (206 lb), SpO2 99 %.   REVIEW OF SYSTEMS:  Review of Systems  Unable to perform ROS: Other     PHYSICAL EXAMINATION:  GENERAL:  26 y.o.-year-old patient lying in the bed with no acute distress.  NECK:  Supple, no jugular venous distention. No thyroid enlargement, no tenderness.  LUNGS: Normal breath sounds bilaterally, no wheezing, rales,rhonchi  No use of  accessory muscles of respiration.  CARDIOVASCULAR: S1, S2 normal. No murmurs, rubs, or gallops.  ABDOMEN: Soft, non-tender, non-distended. Bowel sounds present. No organomegaly or mass.  EXTREMITIES: No pedal edema, cyanosis, or clubbing.  PSYCHIATRIC: The patient is alert and oriented x 3.  SKIN: No obvious rash, lesion, or ulcer.   DATA REVIEW:   CBC  Recent Labs Lab 01/12/17 0701  WBC 4.7  HGB 12.8*  HCT 39.2*  PLT 248    Chemistries   Recent Labs Lab 01/11/17 1816 01/12/17 0107 01/12/17 0701  NA 120* 131* 139  K 3.0* 3.5 4.1  CL 85* 98* 104  CO2 24 25 27   GLUCOSE 115* 103* 96  BUN <5* <5* <5*  CREATININE 0.84 0.60* 0.80  CALCIUM 8.9 9.1 9.3  MG  --  1.8  --   AST 30  --   --   ALT 17  --   --   ALKPHOS 42  --   --   BILITOT 0.6  --   --     Cardiac Enzymes  Recent Labs Lab 01/12/17 0107 01/12/17 0701  TROPONINI <0.03 <0.03    Microbiology Results  @MICRORSLT48 @  RADIOLOGY:  Dg Chest 2 View  Result Date: 01/10/2017 CLINICAL DATA:  Overdose on cough and cold medicine. Cold symptoms for the past several days. EXAM: CHEST  2 VIEW COMPARISON:  12/28/2016 FINDINGS: The heart size and mediastinal contours are within normal limits. Both lungs are clear. The visualized skeletal structures are unremarkable. IMPRESSION: No active cardiopulmonary disease. Electronically Signed   By: Tollie Eth M.D.   On: 01/10/2017 19:33      Current Discharge Medication List    CONTINUE these medications which have NOT CHANGED   Details  benztropine (COGENTIN) 1 MG tablet Take 1 tablet by mouth daily.    atorvastatin (LIPITOR) 10 MG tablet Take 1 tablet (10 mg total) by mouth daily at 6 PM. Qty: 7 tablet, Refills: 0    diphenhydrAMINE (BENADRYL) 50 MG capsule Take 1 capsule (50 mg total) by mouth at bedtime. Qty: 30 capsule, Refills: 0    divalproex (DEPAKOTE ER) 500 MG 24 hr tablet Take 2 tablets (1,000 mg total) by mouth 2 (two) times daily. Qty: 120 tablet,  Refills: 0    haloperidol (HALDOL) 5 MG tablet Take 1 tablet (5 mg total) by mouth 3 (three) times daily. Qty: 90 tablet, Refills: 0    haloperidol decanoate (HALDOL DECANOATE) 100 MG/ML injection Inject 1.5 mLs (150 mg total) into the muscle every 28 (twenty-eight) days. Qty: 1 mL, Refills: 0    lisinopril (PRINIVIL,ZESTRIL) 10 MG tablet Take 1 tablet (10 mg total) by mouth daily. Qty: 7 tablet, Refills: 0    metFORMIN (GLUCOPHAGE) 1000 MG tablet Take 1 tablet (1,000 mg total) by mouth 2 (two) times daily with a meal. Qty: 14 tablet, Refills: 0    Multiple Vitamin (MULTIVITAMIN WITH MINERALS) TABS tablet Take 1 tablet by mouth daily.    ondansetron (  ZOFRAN) 4 MG tablet Take 1 tablet (4 mg total) by mouth every 8 (eight) hours as needed for nausea or vomiting. Qty: 20 tablet, Refills: 0    traZODone (DESYREL) 100 MG tablet Take 1 tablet (100 mg total) by mouth at bedtime. Qty: 30 tablet, Refills: 0    Vitamin D, Ergocalciferol, (DRISDOL) 50000 units CAPS capsule Take 50,000 Units by mouth every 7 (seven) days.         D/w dr Kathy Breach  Management plans discussed with the patient and he is in agreement. Stable for discharge psychiatry unit  Patient should follow up with psyciatry  CODE STATUS:     Code Status Orders        Start     Ordered   01/12/17 0102  Full code  Continuous     01/12/17 0101    Code Status History    Date Active Date Inactive Code Status Order ID Comments User Context   10/30/2016  8:49 PM 11/01/2016  1:53 PM Full Code [id number]  Street, [place], [place] ED   10/08/2016  2:27 AM 10/15/2016  7:24 PM Full Code [id number]  Shari Prows, MD Inpatient      TOTAL TIME TAKING CARE OF THIS PATIENT: 37 minutes.    Note: This dictation was prepared with Dragon dictation along with smaller phrase technology. Any transcriptional errors that result from this process are unintentional.  Jeric Slagel M.D on 01/12/2017 at 8:33 AM  Between 7am to 6pm - Pager  - [phone] After 6pm go to [email or web address] - password [email or web address]  [facility]  Office  [phone]  CC: Primary care physician; Patient, No Pcp Per

## 2017-01-12 NOTE — Progress Notes (Signed)
Admission note:  Patient is a 26 yo male admitted to BMU due to bizarre behavior and agitation.  It is noted in the assessment that patient may 35have smoked a cigarette and felt strange afterward.  Patient has poor eye contact and nods his head "yes" or "no."  Patient has a guardian through [place] [employer].  His name is [name] and there are 2 numbers for him [phone] and [phone].  [name] is patient's guardian and was told from HavenAnnabelle, RN on medical floor that their social worker has contacted him.  Patient talks very little except to ask for "some chocolate milk."  [facility]  When asked if he was suicidal, he nodded his head "no."  When asked if he was hearing voices, he nodded his "no."  Patient also has an ACT team Melida Gimenez(Laurie Arina [phone]).  His ACT team IVC'd him.  Patient denies any alcohol and drug use.  Patient was admitted to medical floor due to low Na+ which was 120 on 8/31.  Patient had been drinking large volumes of water.  Na+ is currently at 139.  Patient has a hx of mild diabetes and HTN.  Patient was oriented to room and unit.

## 2017-01-12 NOTE — Progress Notes (Signed)
Clinical Child psychotherapistocial Worker (CSW) received a consult for competency/guardianship. Per RN patient is a ward of the state and [facility] has guardianship of patient. Guardianship paper work has been scanned into [employer] tab in [employer]. Per RN patient is being discharged from the medical unit and is going downstairs to BMU today. CSW attempted to contact patient's DSS guardian [name] [phone] however he did not answer and a voicemail was left. CSW contacted the [facility] After hours phone # [phone] and spoke to a staff member and made her aware of above. Per chart patient is from a family care home.   [name], LCSW [phone]

## 2017-01-12 NOTE — Consult Note (Signed)
MEDICATION RELATED CONSULT NOTE - INITIAL   Pharmacy Consult for Medication review for hyponatremia Indication: hyponatremia  No Known Allergies  Patient Measurements: Height: 6' (182.9 cm) Weight: 206 lb (93.4 kg) IBW/kg (Calculated) : 77.6 Adjusted Body Weight:   Vital Signs: Temp: 98.3 F (36.8 C) (09/01 0104) Temp Source: Oral (09/01 0104) BP: 142/88 (09/01 0104) Pulse Rate: 79 (09/01 0104) Intake/Output from previous day: No intake/output data recorded. Intake/Output from this shift: No intake/output data recorded.  Labs:  Recent Labs  01/10/17 1538 01/10/17 1930 01/11/17 1816  WBC  --   --  9.3  HGB  --   --  11.4*  HCT  --   --  34.5*  PLT  --   --  232  CREATININE 1.11 0.68 0.84  ALBUMIN 4.2  --  4.5  PROT 7.0  --  7.3  AST 29  --  30  ALT 18  --  17  ALKPHOS 45  --  42  BILITOT 0.5  --  0.6  BILIDIR <0.1*  --   --   IBILI NOT CALCULATED  --   --    Estimated Creatinine Clearance: 158.1 mL/min (by C-G formula based on SCr of 0.84 mg/dL).   Microbiology: No results found for this or any previous visit (from the past 720 hour(s)).  Medical History: Past Medical History:  Diagnosis Date  . Anxiety   . Depression   . Diabetes mellitus without complication (HCC)   . Schizophrenia (HCC)     Medications:  Prescriptions Prior to Admission  Medication Sig Dispense Refill Last Dose  . benztropine (COGENTIN) 1 MG tablet Take 1 tablet by mouth daily.     Marland Kitchen. atorvastatin (LIPITOR) 10 MG tablet Take 1 tablet (10 mg total) by mouth daily at 6 PM. 7 tablet 0 Past Month at Unknown time  . diphenhydrAMINE (BENADRYL) 50 MG capsule Take 1 capsule (50 mg total) by mouth at bedtime. 30 capsule 0 Past Month at Unknown time  . divalproex (DEPAKOTE ER) 500 MG 24 hr tablet Take 2 tablets (1,000 mg total) by mouth 2 (two) times daily. 120 tablet 0   . haloperidol (HALDOL) 5 MG tablet Take 1 tablet (5 mg total) by mouth 3 (three) times daily. 90 tablet 0 Past Month at  Unknown time  . [START ON 02/07/2017] haloperidol decanoate (HALDOL DECANOATE) 100 MG/ML injection Inject 1.5 mLs (150 mg total) into the muscle every 28 (twenty-eight) days. 1 mL 0   . lisinopril (PRINIVIL,ZESTRIL) 10 MG tablet Take 1 tablet (10 mg total) by mouth daily. 7 tablet 0 Past Month at Unknown time  . metFORMIN (GLUCOPHAGE) 1000 MG tablet Take 1 tablet (1,000 mg total) by mouth 2 (two) times daily with a meal. 14 tablet 0 Past Month at Unknown time  . Multiple Vitamin (MULTIVITAMIN WITH MINERALS) TABS tablet Take 1 tablet by mouth daily.   Past Month at Unknown time  . ondansetron (ZOFRAN) 4 MG tablet Take 1 tablet (4 mg total) by mouth every 8 (eight) hours as needed for nausea or vomiting. 20 tablet 0   . traZODone (DESYREL) 100 MG tablet Take 1 tablet (100 mg total) by mouth at bedtime. 30 tablet 0 Past Month at Unknown time  . Vitamin D, Ergocalciferol, (DRISDOL) 50000 units CAPS capsule Take 50,000 Units by mouth every 7 (seven) days.   Past Month at Unknown time    Assessment: Reviewed patient's home med list for potential medications that could cause hyponatremia. 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   Clovia Cuff, PharmD, BCPS 01/12/2017 1:14 AM

## 2017-01-12 NOTE — [facility] Group Notes (Signed)
[facility] Group Notes:  (Nursing/MHT/Case Management/Adjunct)  Date:  01/12/2017  Time:  9:38 PM  Type of Therapy:  Group Therapy  Participation Level:  Did Not Attend Summary of Progress/Problems:  Mayra NeerJackie L Krisalyn Yankowski 01/12/2017, 9:38 PM

## 2017-01-12 NOTE — Progress Notes (Signed)
Called by Psychiatry,stating that pt not compliant with BG check for sliding scale insulin.so I recommended continue metformin for now and see diabetes coordinator on Monday or Tuesday,

## 2017-01-12 NOTE — Progress Notes (Signed)
Patient refused CBGs.  MD discontinued CBGs due to patient having mild diabetes and CBGs have been running low.   Patient does not normally take insulin for his diabetes.

## 2017-01-12 NOTE — Plan of Care (Signed)
Problem: Safety: Goal: Ability to remain free from injury will improve Outcome: Progressing Patient is able to remain free from self harm.  He contracts for safety on the unit.

## 2017-01-12 NOTE — H&P (Addendum)
Psychiatric Admission Assessment Adult  Patient Identification: John Copeland MRN:  [id number] Date of Evaluation:  01/12/2017 Chief Complaint:  Schizoaffective disorder, bipolar type Principal Diagnosis: Schizoaffective disorder, bipolar type ([place]) Diagnosis:   Patient Active Problem List   Diagnosis Date Noted  . Schizoaffective disorder ([place]) [F25.9] 01/12/2017  . Diphenhydramine overdose of undetermined intent [T45.0X4A] 01/10/2017  . Tobacco use disorder [F17.200] 10/07/2016  . Diabetes ([place]) [E11.9] 10/07/2016  . HTN (hypertension) [I10] 10/07/2016  . Dyslipidemia [E78.5] 10/07/2016  . Schizoaffective disorder, bipolar type [facility]) [F25.0] 11/30/2015   History of Present Illness:  John Copeland is a 26 year old male with a history of schizoaffective disorder, diabetes, hypertension, dyslipidemia who presented to the ER because of bizarre behavior.  Excerpt from note by Dr Weber Cooks "It's noted in one of the nursing notes that he had talked about how he was feeling very strange after smoking a cigarette. When I saw the patient in his room he was awake and made eye contact with me but wouldn't answer any of my questions. He was calm at that time but after I brought him some juice he made quite a mess with it in the next thing I saw he was on his knees reading a Bible and talking to himself and again refusing to answer my questions. This young man has a history of chronic mental illness. His act team had noticed that he had been decompensating somewhat recently and seemed to be getting more manic. Last time I talked with him was yesterday and at that time he was clearly hyper religious however yesterday he did let us give him a Haldol decanoate shot. He denies that he has used any drugs. Yesterday he had come to the hospital after taking an entire box of cough and cold medicine. I ask him if he had done the same thing again today and he denied it."  John Copeland says to me today "I don't know how I  got to the emergency room yesterday. I don't want to talk about it." Endorses that things have not been going well at his assisted living home, says he does not want to elucidate any more than this. Asks me if I can get him something to eat, says that he had lunch but he is still hungry. States that hospitalization is not going to help him at all and he would like to go home. Says he has been on psychiatry medications for a very long time and is unable to remember how long. Denies any thoughts of wanting to hurt himself or others.  Has a bald patch on the right side of his scalp and when I ask him how he got it he says that he tried to cut his hair at the assisted living facility yesterday and is unable to remember why he stopped. During the interview that instances wherein he loses attention and seems to be responding to internal stimuli. When I ask him if there is anyone I can get collateral from, he states that his nobody. His mom died when he was 55 years old and he has lost a brother. Says he is not close with anyone else.  Associated Signs/Symptoms: Depression Symptoms:  feelings of worthlessness/guilt, difficulty concentrating, hopelessness, (Hypo) Manic Symptoms:  none Anxiety Symptoms:  denies Psychotic Symptoms:  Hallucinations: ?Seems to be RTIS Paranoia, PTSD Symptoms: Negative Total Time spent with patient: 45 minutes  Past Psychiatric History: Endorses that the last time he had a normal life was when he was 26 years  old. States he does not remember how long he's been on psychiatry medications. Dr. Weber Cooks' note states a diagnosis of schizophrenia disorder, multiple hospitalizations and not being happy with the group home situation. Most recent medications of Depakote and Haldol.  Is the patient at risk to self? Yes.    Has the patient been a risk to self in the past 6 months? Yes.    Has the patient been a risk to self within the distant past? No.  Is the patient a risk to others?  No.  Has the patient been a risk to others in the past 6 months? No Has the patient been a risk to others within the distant past? No.   Prior Inpatient Therapy:   Prior Outpatient Therapy:    Alcohol Screening: 1. How often do you have a drink containing alcohol?: Never 9. Have you or someone else been injured as a result of your drinking?: No 10. Has a relative or friend or a doctor or another health worker been concerned about your drinking or suggested you cut down?: No Alcohol Use Disorder Identification Test Final Score (AUDIT): 0 Brief Intervention: AUDIT score less than 7 or less-screening does not suggest unhealthy drinking-brief intervention not indicated Substance Abuse History in the last 12 months:  Unknown, denies any h/o substance use currently.  Consequences of Substance Abuse: unknown Previous Psychotropic Medications: Yes  Psychological Evaluations: Yes  Past Medical History:  Past Medical History:  Diagnosis Date  . Anxiety   . Depression   . Diabetes mellitus without complication ([place])   . Schizophrenia ([place])    History reviewed. No pertinent surgical history.   When I asked him about the low-sodium and drinking excessive water, he says yes. States that he drinks a lot of water, says he cannot elucidate any more than that. Family History: History reviewed. No pertinent family history. Family Psychiatric  History: unknown Tobacco Screening:   Denies smoking cigarettes Social History:  History  Alcohol Use No     History  Drug Use No    Additional Social History:  endorses that he is gone up all over [place]. States mom died when he was 13 years old and that he lost a brother too. Says he keeps in touch with his other family members but is not close with any of them. Is on SSI, says he does not like his assisted living facility but it's okay. Says the last time he worked was at the age of 19 and that was at [employer]. He has not worked after  this. Education - ninth grade                         Allergies:  No Known Allergies Lab Results:  Results for orders placed or performed during the hospital encounter of 01/11/17 (from the past 48 hour(s))  Comprehensive metabolic panel     Status: Abnormal   Collection Time: 01/11/17  6:16 PM  Result Value Ref Range   Sodium 120 (L) 135 - 145 mmol/L   Potassium 3.0 (L) 3.5 - 5.1 mmol/L   Chloride 85 (L) 101 - 111 mmol/L   CO2 24 22 - 32 mmol/L   Glucose, Bld 115 (H) 65 - 99 mg/dL   BUN <5 (L) 6 - 20 mg/dL   Creatinine, Ser 0.84 0.61 - 1.24 mg/dL   Calcium 8.9 8.9 - 10.3 mg/dL   Total Protein 7.3 6.5 - 8.1 g/dL   Albumin  4.5 3.5 - 5.0 g/dL   AST 30 15 - 41 U/L   ALT 17 17 - 63 U/L   Alkaline Phosphatase 42 38 - 126 U/L   Total Bilirubin 0.6 0.3 - 1.2 mg/dL   GFR calc non Af Amer >60 >60 mL/min   GFR calc Af Amer >60 >60 mL/min    Comment: (NOTE) The eGFR has been calculated using the CKD EPI equation. This calculation has not been validated in all clinical situations. eGFR's persistently <60 mL/min signify possible Chronic Kidney Disease.    Anion gap 11 5 - 15  Ethanol     Status: None   Collection Time: 01/11/17  6:16 PM  Result Value Ref Range   Alcohol, Ethyl (B) <5 <5 mg/dL    Comment:        LOWEST DETECTABLE LIMIT FOR SERUM ALCOHOL IS 5 mg/dL FOR MEDICAL PURPOSES ONLY   Salicylate level     Status: None   Collection Time: 01/11/17  6:16 PM  Result Value Ref Range   Salicylate Lvl <8.1 2.8 - 30.0 mg/dL  Acetaminophen level     Status: Abnormal   Collection Time: 01/11/17  6:16 PM  Result Value Ref Range   Acetaminophen (Tylenol), Serum <10 (L) 10 - 30 ug/mL    Comment:        THERAPEUTIC CONCENTRATIONS VARY SIGNIFICANTLY. A RANGE OF 10-30 ug/mL MAY BE AN EFFECTIVE CONCENTRATION FOR MANY PATIENTS. HOWEVER, SOME ARE BEST TREATED AT CONCENTRATIONS OUTSIDE THIS RANGE. ACETAMINOPHEN CONCENTRATIONS >150 ug/mL AT 4 HOURS AFTER INGESTION AND >50  ug/mL AT 12 HOURS AFTER INGESTION ARE OFTEN ASSOCIATED WITH TOXIC REACTIONS.   cbc     Status: Abnormal   Collection Time: 01/11/17  6:16 PM  Result Value Ref Range   WBC 9.3 3.8 - 10.6 K/uL   RBC 4.74 4.40 - 5.90 MIL/uL   Hemoglobin 11.4 (L) 13.0 - 18.0 g/dL   HCT 34.5 (L) 40.0 - 52.0 %   MCV 72.8 (L) 80.0 - 100.0 fL   MCH 24.1 (L) 26.0 - 34.0 pg   MCHC 33.2 32.0 - 36.0 g/dL   RDW 13.9 11.5 - 14.5 %   Platelets 232 150 - 440 K/uL  Urine Drug Screen, Qualitative     Status: None   Collection Time: 01/11/17  6:16 PM  Result Value Ref Range   Tricyclic, Ur Screen NONE DETECTED NONE DETECTED   Amphetamines, Ur Screen NONE DETECTED NONE DETECTED   MDMA (Ecstasy)Ur Screen NONE DETECTED NONE DETECTED   Cocaine Metabolite,Ur [place] NONE DETECTED NONE DETECTED   Opiate, Ur Screen NONE DETECTED NONE DETECTED   Phencyclidine (PCP) Ur S NONE DETECTED NONE DETECTED   Cannabinoid 50 Ng, Ur [place] NONE DETECTED NONE DETECTED   Barbiturates, Ur Screen NONE DETECTED NONE DETECTED   Benzodiazepine, Ur Scrn NONE DETECTED NONE DETECTED   Methadone Scn, Ur NONE DETECTED NONE DETECTED    Comment: (NOTE) 191  Tricyclics, urine               Cutoff 1000 ng/mL 200  Amphetamines, urine             Cutoff 1000 ng/mL 300  MDMA (Ecstasy), urine           Cutoff 500 ng/mL 400  Cocaine Metabolite, urine       Cutoff 300 ng/mL 500  Opiate, urine                   Cutoff 300 ng/mL 600  Phencyclidine (PCP), urine      Cutoff 25 ng/mL 700  Cannabinoid, urine              Cutoff 50 ng/mL 800  Barbiturates, urine             Cutoff 200 ng/mL 900  Benzodiazepine, urine           Cutoff 200 ng/mL 1000 Methadone, urine                Cutoff 300 ng/mL 1100 1200 The urine drug screen provides only a preliminary, unconfirmed 1300 analytical test result and should not be used for non-medical 1400 purposes. Clinical consideration and professional judgment should 1500 be applied to any positive drug screen result due to  possible 1600 interfering substances. A more specific alternate chemical method 1700 must be used in order to obtain a confirmed analytical result.  1800 Gas chromato graphy / mass spectrometry (GC/MS) is the preferred 1900 confirmatory method.   Basic metabolic panel     Status: Abnormal   Collection Time: 01/12/17  1:07 AM  Result Value Ref Range   Sodium 131 (L) 135 - 145 mmol/L   Potassium 3.5 3.5 - 5.1 mmol/L   Chloride 98 (L) 101 - 111 mmol/L   CO2 25 22 - 32 mmol/L   Glucose, Bld 103 (H) 65 - 99 mg/dL   BUN <5 (L) 6 - 20 mg/dL   Creatinine, Ser 0.60 (L) 0.61 - 1.24 mg/dL   Calcium 9.1 8.9 - 10.3 mg/dL   GFR calc non Af Amer >60 >60 mL/min   GFR calc Af Amer >60 >60 mL/min    Comment: (NOTE) The eGFR has been calculated using the CKD EPI equation. This calculation has not been validated in all clinical situations. eGFR's persistently <60 mL/min signify possible Chronic Kidney Disease.    Anion gap 8 5 - 15  Osmolality     Status: Abnormal   Collection Time: 01/12/17  1:07 AM  Result Value Ref Range   Osmolality 268 (L) 275 - 295 mOsm/kg  Troponin I (q 6hr x 3)     Status: None   Collection Time: 01/12/17  1:07 AM  Result Value Ref Range   Troponin I <0.03 <0.03 ng/mL  Magnesium     Status: None   Collection Time: 01/12/17  1:07 AM  Result Value Ref Range   Magnesium 1.8 1.7 - 2.4 mg/dL  Phosphorus     Status: None   Collection Time: 01/12/17  1:07 AM  Result Value Ref Range   Phosphorus 4.5 2.5 - 4.6 mg/dL  Urinalysis, Routine w reflex microscopic     Status: Abnormal   Collection Time: 01/12/17  1:09 AM  Result Value Ref Range   Color, Urine COLORLESS (A) YELLOW   APPearance CLEAR (A) CLEAR   Specific Gravity, Urine 1.000 (L) 1.005 - 1.030   pH 7.0 5.0 - 8.0   Glucose, UA NEGATIVE NEGATIVE mg/dL   Hgb urine dipstick NEGATIVE NEGATIVE   Bilirubin Urine NEGATIVE NEGATIVE   Ketones, ur NEGATIVE NEGATIVE mg/dL   Protein, ur NEGATIVE NEGATIVE mg/dL   Nitrite  NEGATIVE NEGATIVE   Leukocytes, UA NEGATIVE NEGATIVE  Osmolality, urine     Status: Abnormal   Collection Time: 01/12/17  1:09 AM  Result Value Ref Range   Osmolality, Ur 37 (L) 300 - 900 mOsm/kg  Glucose, capillary     Status: Abnormal   Collection Time: 01/12/17  1:17 AM  Result Value Ref Range  Glucose-Capillary 108 (H) 65 - 99 mg/dL  MRSA PCR Screening     Status: None   Collection Time: 01/12/17  1:34 AM  Result Value Ref Range   MRSA by PCR NEGATIVE NEGATIVE    Comment:        The GeneXpert MRSA Assay (FDA approved for NASAL specimens only), is one component of a comprehensive MRSA colonization surveillance program. It is not intended to diagnose MRSA infection nor to guide or monitor treatment for MRSA infections.   Troponin I (q 6hr x 3)     Status: None   Collection Time: 01/12/17  7:01 AM  Result Value Ref Range   Troponin I <0.03 <0.03 ng/mL  Basic metabolic panel     Status: Abnormal   Collection Time: 01/12/17  7:01 AM  Result Value Ref Range   Sodium 139 135 - 145 mmol/L    Comment: RESULTS VERIFIED BY REPEAT TESTING/HKP   Potassium 4.1 3.5 - 5.1 mmol/L   Chloride 104 101 - 111 mmol/L   CO2 27 22 - 32 mmol/L   Glucose, Bld 96 65 - 99 mg/dL   BUN <5 (L) 6 - 20 mg/dL   Creatinine, Ser 0.80 0.61 - 1.24 mg/dL   Calcium 9.3 8.9 - 10.3 mg/dL   GFR calc non Af Amer >60 >60 mL/min   GFR calc Af Amer >60 >60 mL/min    Comment: (NOTE) The eGFR has been calculated using the CKD EPI equation. This calculation has not been validated in all clinical situations. eGFR's persistently <60 mL/min signify possible Chronic Kidney Disease.    Anion gap 8 5 - 15  CBC     Status: Abnormal   Collection Time: 01/12/17  7:01 AM  Result Value Ref Range   WBC 4.7 3.8 - 10.6 K/uL   RBC 5.32 4.40 - 5.90 MIL/uL   Hemoglobin 12.8 (L) 13.0 - 18.0 g/dL   HCT 39.2 (L) 40.0 - 52.0 %   MCV 73.8 (L) 80.0 - 100.0 fL   MCH 24.1 (L) 26.0 - 34.0 pg   MCHC 32.7 32.0 - 36.0 g/dL   RDW  13.7 11.5 - 14.5 %   Platelets 248 150 - 440 K/uL  Glucose, capillary     Status: None   Collection Time: 01/12/17  8:03 AM  Result Value Ref Range   Glucose-Capillary 94 65 - 99 mg/dL  Glucose, capillary     Status: None   Collection Time: 01/12/17 11:35 AM  Result Value Ref Range   Glucose-Capillary 89 65 - 99 mg/dL    Blood Alcohol level:  Lab Results  Component Value Date   ETH <5 01/11/2017   ETH <5 32/20/2542    Metabolic Disorder Labs:  No results found for: HGBA1C, MPG No results found for: PROLACTIN No results found for: CHOL, TRIG, HDL, CHOLHDL, VLDL, LDLCALC  Current Medications: Current Facility-Administered Medications  Medication Dose Route Frequency Provider Last Rate Last Dose  . acetaminophen (TYLENOL) tablet 650 mg  650 mg Oral Q6H PRN Ramond Dial, MD      . albuterol (PROVENTIL) (2.5 MG/3ML) 0.083% nebulizer solution 2.5 mg  2.5 mg Nebulization Q6H PRN Ramond Dial, MD      . alum & mag hydroxide-simeth (MAALOX/MYLANTA) 200-200-20 MG/5ML suspension 30 mL  30 mL Oral Q4H PRN Ramond Dial, MD      . atorvastatin (LIPITOR) tablet 10 mg  10 mg Oral q1800 Ramond Dial, MD      . Derrill Memo ON 01/13/2017] benztropine (COGENTIN) tablet  1 mg  1 mg Oral Daily Ramond Dial, MD      . bisacodyl (DULCOLAX) EC tablet 5 mg  5 mg Oral Daily PRN Ramond Dial, MD      . diphenhydrAMINE (BENADRYL) capsule 50 mg  50 mg Oral QHS Ramond Dial, MD      . divalproex (DEPAKOTE ER) 24 hr tablet 1,000 mg  1,000 mg Oral BID Ramond Dial, MD      . haloperidol (HALDOL) tablet 5 mg  5 mg Oral TID Ramond Dial, MD      . insulin aspart (novoLOG) injection 0-15 Units  0-15 Units Subcutaneous TID WC Ramond Dial, MD      . insulin aspart (novoLOG) injection 0-5 Units  0-5 Units Subcutaneous QHS Ramond Dial, MD      . ipratropium (ATROVENT) nebulizer solution 0.5 mg  0.5 mg Nebulization Q6H PRN Ramond Dial, MD      . Derrill Memo  ON 01/13/2017] lisinopril (PRINIVIL,ZESTRIL) tablet 10 mg  10 mg Oral Daily Ramond Dial, MD      . magnesium hydroxide (MILK OF MAGNESIA) suspension 30 mL  30 mL Oral Daily PRN Ramond Dial, MD      . Derrill Memo ON 01/13/2017] multivitamin with minerals tablet 1 tablet  1 tablet Oral Daily Ramond Dial, MD      . ondansetron (ZOFRAN) tablet 4 mg  4 mg Oral Q8H PRN Ramond Dial, MD      . traZODone (DESYREL) tablet 100 mg  100 mg Oral QHS Ramond Dial, MD       PTA Medications: Prescriptions Prior to Admission  Medication Sig Dispense Refill Last Dose  . atorvastatin (LIPITOR) 10 MG tablet Take 1 tablet (10 mg total) by mouth daily at 6 PM. (Patient not taking: Reported on 01/12/2017) 7 tablet 0 Not Taking at Unknown time  . diphenhydrAMINE (BENADRYL) 50 MG capsule Take 1 capsule (50 mg total) by mouth at bedtime. 30 capsule 0 Past Month at Unknown time  . divalproex (DEPAKOTE ER) 500 MG 24 hr tablet Take 2 tablets (1,000 mg total) by mouth 2 (two) times daily. 120 tablet 0 Past Month at Unknown time  . haloperidol (HALDOL) 5 MG tablet Take 1 tablet (5 mg total) by mouth 3 (three) times daily. (Patient not taking: Reported on 01/12/2017) 90 tablet 0 Not Taking at Unknown time  . [START ON 02/07/2017] haloperidol decanoate (HALDOL DECANOATE) 100 MG/ML injection Inject 1.5 mLs (150 mg total) into the muscle every 28 (twenty-eight) days. (Patient not taking: Reported on 01/12/2017) 1 mL 0 Not Taking at Unknown time  . lisinopril (PRINIVIL,ZESTRIL) 10 MG tablet Take 1 tablet (10 mg total) by mouth daily. (Patient not taking: Reported on 01/12/2017) 7 tablet 0 Not Taking at Unknown time  . metFORMIN (GLUCOPHAGE) 1000 MG tablet Take 1 tablet (1,000 mg total) by mouth 2 (two) times daily with a meal. (Patient not taking: Reported on 01/12/2017) 14 tablet 0 Not Taking at Unknown time  . ondansetron (ZOFRAN) 4 MG tablet Take 1 tablet (4 mg total) by mouth every 8 (eight) hours as needed for nausea  or vomiting. (Patient not taking: Reported on 01/12/2017) 20 tablet 0 Not Taking at Unknown time  . traZODone (DESYREL) 100 MG tablet Take 1 tablet (100 mg total) by mouth at bedtime. (Patient not taking: Reported on 01/12/2017) 30 tablet 0 Not Taking at Unknown time    Musculoskeletal: Strength & Muscle Tone: within normal limits Gait & Station: normal Patient leans: N/A  Psychiatric Specialty Exam: Physical  Exam  ROS  Blood pressure 112/69, pulse 95, temperature 98.1 F (36.7 C), temperature source Oral, resp. rate 18, height 6' (1.829 m), weight 190 lb (86.2 kg), SpO2 100 %.Body mass index is 25.77 kg/m.  General Appearance: Seems aloof and intermittently zones out. Is optimally responsive to questions and does not exhbiit any catatonic symptoms.   Eye Contact:  Minimal  Speech:  Rate, rhythm normal.   Volume:  Decreased  Mood:  Irritable  Affect:  Blunt  Thought Process:  tangential  Orientation:  Full (Time, Place, and Person)  Thought Content:  Poverty of thought content. RTIS  Suicidal Thoughts:  No  Homicidal Thoughts:  No    Judgement:  Impaired  Insight:  Lacking  Psychomotor Activity:  Normal  Sleep:    States it is good.    Treatment Plan Summary: Daily contact with patient to assess and evaluate symptoms and progress in treatment   #Schizoaffective disorder - Continue divalproex 1000 mg BID - Continue haldol 5 mg TID - Continue benztropine 1 mg daily  #Polydipsia  Urine osmolality of 37 and serum osmolality of 268 Fluid restriction to <1.5 L/day  #DM (Discussed with [place]) - patient refuses insulin. Discussed with hospitalist on call - continue metformin 1000 mg [facility] Diabetes coordinator consult.  #Dyslipidemia - Continue atorvastatin 10 mg daily  #Constipation Continue bisacodyl 5 mg daily PRN  #Insomnia Diphenhydramine 50 mg QHS  Continue trazodone 100 mg QHS  #HTN Continue lisinopril 10 mg daily  Observation  Level/Precautions:  Continuous Observation  Laboratory:  none  Psychotherapy:    Medications:    Consultations:    Discharge Concerns:    Estimated LOS:  Other:     Physician Treatment Plan for Primary Diagnosis: Schizoaffective disorder, bipolar type ([place]) Long Term Goal(s): Improvement in symptoms so as ready for discharge  Short Term Goals: psychoeducation on importance of medications.  Physician Treatment Plan for Secondary Diagnosis: Principal Problem:   Schizoaffective disorder, bipolar type ([place]) Active Problems:   Diphenhydramine overdose of undetermined intent   Schizoaffective disorder ([place])  Long Term Goal(s): Improvement in symptoms so as ready for discharge  Short Term Goals: Compliance with prescribed medications will improve  I certify that inpatient services furnished can reasonably be expected to improve the patient's condition.    Ramond Dial, MD 9/1/20183:57 PM

## 2017-01-12 NOTE — BH Assessment (Signed)
[facility] Dr. Toni Amendlapacs.  Attending Physician will be Dr. Jennet MaduroPucilowska.   [facility].   Intake Paper Work has been signed and placed on patient chart.  Staff is aware of the admission (Annabelle, Patient's Nurse & Abby, Patient Access).

## 2017-01-12 NOTE — Plan of Care (Signed)
Problem: Activity: Goal: Risk for activity intolerance will decrease Outcome: Not Progressing Patient sedentary and in bed from start of shift up to the entry of this note.   Problem: Coping: Goal: Ability to verbalize feelings will improve Patient selectively mute. Refused assessment.   Problem: Health Behavior/Discharge Planning: Goal: Compliance with therapeutic regimen will improve Outcome: Not Progressing Patient refused all HS medications. Refused to attend PM wrap up group.   Problem: Activity: Goal: Sleeping patterns will improve Patient in bed resting and appears to be sleeping with no disturbances

## 2017-01-12 NOTE — Plan of Care (Signed)
Problem: Coping: Goal: Ability to verbalize frustrations and anger appropriately will improve Outcome: Not Progressing Patient has difficulty verbalizing frustrations and anger appropriately as evidenced by his lack of communication with staff.

## 2017-01-12 NOTE — Tx Team (Signed)
Initial Treatment Plan 01/12/2017 3:23 PM John MuffJoshua Omar Edling [id number]    PATIENT STRESSORS: Educational concerns Occupational concerns   PATIENT STRENGTHS: Other: Patient has congnitive delayed function   PATIENT IDENTIFIED PROBLEMS: Depression  Suicide Risk  Difficulty communicating  Lives in a group home               DISCHARGE CRITERIA:  Improved stabilization in mood, thinking, and/or behavior Reduction of life-threatening or endangering symptoms to within safe limits  PRELIMINARY DISCHARGE PLAN: Outpatient therapy Return to previous living arrangement  PATIENT/FAMILY INVOLVEMENT: This treatment plan has been presented to and reviewed with the patient, John Copeland.  The patient and family have been given the opportunity to ask questions and make suggestions.  Cranford MonBeaudry, Jeet Shough Evans, RN 01/12/2017, 3:23 PM

## 2017-01-13 DIAGNOSIS — F250 Schizoaffective disorder, bipolar type: Secondary | ICD-10-CM

## 2017-01-13 LAB — HIV ANTIBODY (ROUTINE TESTING W REFLEX): HIV Screen 4th Generation wRfx: NONREACTIVE

## 2017-01-13 LAB — SODIUM: SODIUM: 140 mmol/L (ref 135–145)

## 2017-01-13 NOTE — [facility] Suicide Risk Assessment (Signed)
[facility]   Nursing information obtained from:    Demographic factors:    Current Mental Status:    Loss Factors:    Historical Factors:    Risk Reduction Factors:     Total Time spent with patient: 15 minutes Principal Problem: Schizoaffective disorder, bipolar type (HCC) Diagnosis:   Patient Active Problem List   Diagnosis Date Noted  . Schizoaffective disorder (HCC) [F25.9] 01/12/2017  . Polydipsia [R63.1] 01/12/2017  . Diphenhydramine overdose of undetermined intent [T45.0X4A] 01/10/2017  . Tobacco use disorder [F17.200] 10/07/2016  . Diabetes (HCC) [E11.9] 10/07/2016  . HTN (hypertension) [I10] 10/07/2016  . Dyslipidemia [E78.5] 10/07/2016  . Schizoaffective disorder, bipolar type (HCC) [F25.0] 11/30/2015   Subjective Data: Irritability, ?H/O overdose  Continued Clinical Symptoms:  Alcohol Use Disorder Identification Test Final Score (AUDIT): 0 The "Alcohol Use Disorders Identification Test", Guidelines for Use in Primary Care, Second Edition.  World Science [facility] [facility](WHO). Score between 0-7:  no or low risk or alcohol related problems. Score between 8-15:  moderate risk of alcohol related problems. Score between 16-19:  high risk of alcohol related problems. Score 20 or above:  warrants further diagnostic evaluation for alcohol dependence and treatment.   CLINICAL FACTORS:   Schizophrenia:   Less than 26 years old   Musculoskeletal: Strength & Muscle Tone: within normal limits Gait & Station: normal Patient leans: N/A  Psychiatric Specialty Exam: Physical Exam  ROS  Blood pressure 112/69, pulse 95, temperature 98.1 F (36.7 C), temperature source Oral, resp. rate 18, height 6' (1.829 m), weight 190 lb (86.2 kg), SpO2 100 %.Body mass index is 25.77 kg/m.  General Appearance: Casual and Disheveled  Eye Contact:  Minimal  Speech:  Normal Rate  Volume:  Normal  Mood:  Irritable  Affect:  Flat  Thought Process:  Coherent   Orientation:  Full (Time, Place, and Person)  Thought Content:  Negative  Suicidal Thoughts:  No  Homicidal Thoughts:  No  Memory:  Negative  Judgement:  Impaired  Insight:  Fair  Psychomotor Activity:  Normal  AIMS (if indicated):     Assets:  Communication Skills Social Support  ADL's:  Intact  Cognition:  WNL  Sleep:  Number of Hours: 8.5      COGNITIVE FEATURES THAT CONTRIBUTE TO RISK:  Thought constriction (tunnel vision)    SUICIDE RISK:   Minimal: No identifiable suicidal ideation.  Patients presenting with no risk factors but with morbid ruminations; may be classified as minimal risk based on the severity of the depressive symptoms  PLAN OF CARE: Continue haldol and depakote, contact guardian to finalize disposition.  I certify that inpatient services furnished can reasonably be expected to improve the patient's condition.   Aundria RudGopalkumar Cloris Flippo, MD 01/13/2017, 12:20 PM

## 2017-01-13 NOTE — [facility] Group Notes (Signed)
[facility] LCSW Group Therapy  01/13/2017 11:07 AM  Type of Therapy:  Group Therapy  Participation Level:  Did Not Attend  Modes of Intervention:  Activity, Discussion, Education, Socialization and Support  Summary of Progress/Problems: Balance in life: Patients will discuss the concept of balance and how it looks and feels to be unbalanced. Pt will identify areas in their life that is unbalanced and ways to become more balanced.   Herbert Aguinaldo L Manvir Prabhu MSW, LCSW  01/13/2017, 11:07 AM   

## 2017-01-13 NOTE — [facility] Group Notes (Signed)
[facility] Group Notes:  (Nursing/MHT/Case Management/Adjunct)  Date:  01/13/2017  Time:  9:35 PM  Type of Therapy:  Evening Wrap-up Group  Participation Level:  Did Not Attend  Participation Quality:  N/A  Affect:  N/A  Cognitive:  N/A  Insight:  None  Engagement in Group:  Did Not Attend  Modes of Intervention:  Discussion  Summary of Progress/Problems:  John MorrowChelsea Copeland John Copeland 01/13/2017, 9:35 PM

## 2017-01-13 NOTE — Progress Notes (Addendum)
[facility]  01/13/2017 11:54 AM John MuffJoshua Omar Copeland  MRN:  [id number] Subjective:   Patient reports that he had a good night and would like to go home. On looking up Care Everywhere, I noticed that he has had multiple [facility]. The notes from these ED visits detail him his being homeless, having relocated from [place] a month ago and being in different group homes with no corroboration available. They also detailed him as having requested for his medications, mainly Depakote and tra[facility]odone from [facility]. They also detailed that his legal guardian [place] in [place]. The numbers to reach them [phone] and [phone].  When I asked patient about these details, he says he does not recall being near [facility] initially. Later he confirms that it is possible that he was [facility]. Endorses he may have been in a shelter for some period of time before moving into his current place of stay which is [facility] home.   His agreeable to fluid restriction and asks if he can go home. Has been noticed to be quiet and isolative in his room with no interactions with other patients. Has been taking his medications. Principal Problem: Schizoaffective disorder, bipolar type (HCC) Diagnosis:   Patient Active Problem List   Diagnosis Date Noted  . Schizoaffective disorder (HCC) [F25.9] 01/12/2017  . Polydipsia [R63.1] 01/12/2017  . Diphenhydramine overdose of undetermined intent [T45.0X4A] 01/10/2017  . Tobacco use disorder [F17.200] 10/07/2016  . Diabetes (HCC) [E11.9] 10/07/2016  . HTN (hypertension) [I10] 10/07/2016  . Dyslipidemia [E78.5] 10/07/2016  . Schizoaffective disorder, bipolar type (HCC) [F25.0] 11/30/2015   Total Time spent with patient: 30 minutes  Past Psychiatric History:  Endorses that the last time he had a normal life was when he was 26 years old. States he does not remember how long he's been on psychiatry medications. Dr. Toni Amendlapacs' note  states a diagnosis of schizophrenia disorder, multiple hospitalizations and not being happy with the group home situation. Most recent medications of Depakote and Haldol  Past Medical History:  Past Medical History:  Diagnosis Date  . Anxiety   . Depression   . Diabetes mellitus without complication (HCC)   . Schizophrenia (HCC)    History reviewed. No pertinent surgical history. Family History: History reviewed. No pertinent family history. Family Psychiatric  History: Social History:  History  Alcohol Use No     History  Drug Use No    Social History   Social History  . Marital status: Single    Spouse name: N/A  . Number of children: N/A  . Years of education: N/A   Social History Main Topics  . Smoking status: Current Every Day Smoker    Packs/day: 0.50    Types: Cigarettes  . Smokeless tobacco: Never Used  . Alcohol use No  . Drug use: No  . Sexual activity: Not Asked   Other Topics Concern  . None   Social History Narrative  . None   Additional Social History:      Endorses that [place]. States mom died when he was 26 years old and that he lost a brother too. Says he keeps in touch with his other family members but is not close with any of them. Is on SSI, says he does not like his assisted living facility but it's okay. Says the last time he worked was at the age of 26 and that was at [employer]. He has not worked after this. Education - ninth grade                    Sleep: Good  Appetite:  Good  Current Medications: Current Facility-Administered Medications  Medication Dose Route Frequency Provider Last Rate Last Dose  . acetaminophen (TYLENOL) tablet 650 mg  650 mg Oral Q6H PRN Aundria Rud, MD      . albuterol (PROVENTIL) (2.5 MG/3ML) 0.083% nebulizer solution 2.5 mg  2.5 mg Nebulization Q6H PRN Aundria Rud, MD      . alum & mag hydroxide-simeth (MAALOX/MYLANTA) 200-200-20 MG/5ML suspension 30  mL  30 mL Oral Q4H PRN Aundria Rud, MD      . atorvastatin (LIPITOR) tablet 10 mg  10 mg Oral [id number] Aundria Rud, MD   10 mg at 01/12/17 1632  . benztropine (COGENTIN) tablet 1 mg  1 mg Oral Daily Aundria Rud, MD   1 mg at 01/13/17 6578  . bisacodyl (DULCOLAX) EC tablet 5 mg  5 mg Oral Daily PRN Aundria Rud, MD      . diphenhydrAMINE (BENADRYL) capsule 50 mg  50 mg Oral QHS Aundria Rud, MD      . divalproex (DEPAKOTE ER) 24 hr tablet 1,000 mg  1,000 mg Oral BID Aundria Rud, MD   1,000 mg at 01/13/17 4696  . haloperidol (HALDOL) tablet 5 mg  5 mg Oral TID Aundria Rud, MD   5 mg at 01/13/17 1144  . ipratropium (ATROVENT) nebulizer solution 0.5 mg  0.5 mg Nebulization Q6H PRN Aundria Rud, MD      . lisinopril (PRINIVIL,ZESTRIL) tablet 10 mg  10 mg Oral Daily Aundria Rud, MD   10 mg at 01/13/17 0846  . magnesium hydroxide (MILK OF MAGNESIA) suspension 30 mL  30 mL Oral Daily PRN Aundria Rud, MD      . metFORMIN (GLUCOPHAGE) tablet 1,000 mg  1,000 mg Oral BID WC Aundria Rud, MD   1,000 mg at 01/13/17 0846  . multivitamin with minerals tablet 1 tablet  1 tablet Oral Daily Aundria Rud, MD   1 tablet at 01/13/17 0846  . ondansetron (ZOFRAN) tablet 4 mg  4 mg Oral Q8H PRN Aundria Rud, MD      . traZODone (DESYREL) tablet 100 mg  100 mg Oral QHS Aundria Rud, MD        Lab Results:  Results for orders placed or performed during the hospital encounter of 01/12/17 (from the past 48 hour(s))  Sodium     Status: None   Collection Time: 01/13/17  6:34 AM  Result Value Ref Range   Sodium 140 135 - 145 mmol/L    Blood Alcohol level:  Lab Results  Component Value Date   ETH <5 01/11/2017   ETH <5 01/10/2017    Metabolic Disorder Labs: No results found for: HGBA1C, MPG No results found for: PROLACTIN No results found for: CHOL, TRIG, HDL, CHOLHDL, VLDL, LDLCALC  Physical Findings: AIMS: Facial and Oral  Movements Muscles of Facial Expression: None, normal Lips and Perioral Area: None, normal Jaw: None, normal Tongue: None, normal,Extremity Movements Upper (arms, wrists, hands, fingers): None, normal Lower (legs, knees, ankles, toes): None, normal, Trunk Movements Neck, shoulders, hips: None, normal, Overall Severity Severity of abnormal movements (highest score from questions above): None, normal Incapacitation due to abnormal movements: None, normal Patient's awareness of abnormal movements (rate only patient's report): No Awareness, Dental Status Current problems with teeth and/or dentures?: No Does patient usually wear dentures?:  No  CIWA:    COWS:     Musculoskeletal: Strength & Muscle Tone: within normal limits Gait & Station: normal Patient leans: N/A  Psychiatric Specialty Exam: Physical Exam  ROS  Blood pressure 112/69, pulse 95, temperature 98.1 F (36.7 C), temperature source Oral, resp. rate 18, height 6' (1.829 m), weight 190 lb (86.2 kg), SpO2 100 %.Body mass index is 25.77 kg/m.  General Appearance: Guarded  Eye Contact:  Good  Speech:  Normal Rate  Volume:  Normal  Mood:  Dysphoric  Affect:  Flat  Thought Process:  Coherent  Orientation:  Full (Time, Place, and Person)  Thought Content:  deies  Suicidal Thoughts:  No  Homicidal Thoughts:  No  Judgement:  Poor  Insight:  Fair  Psychomotor Activity:  Normal  AIMS (if indicated):     Assets:  Others:  He is compliant on his medications  ADL's:  Intact  Cognition:  Not assessed  Sleep:  Number of Hours: 12.36   26 year old gentleman with a history of schizoaffective disorder presenting to the ER on the 31st after? Benadryl overdose. Serum sodium was 120 on presentation and he was admitted to the medical floor for correction of hyponatremia. Polydipsia seems to be possible differential, he did detail and he was drinking a lot of water. Endorses that he does not remember how he reached the emergency room after  admission. Overall he is compliant to medications, discrepancies exist in his accounts where he is staying or even if he has a place to go back to. I left a message for his guardian in [facility] and this will need to be followed up on Tuesday. At present he has a flat affect, apart from being guarded I cannot elicit any other acute psychopathology.  #Schizoaffective disorder - Continue divalproex 1000 mg BID - Continue haldol 5 mg TID - Continue benztropine 1 mg daily  #Polydipsia  Urine osmolality of 37 and serum osmolality of 268 Serum sodium on 01/13/17 is 140. Repeat serum sodium on 01/15/17.  Fluid restriction to <1.5 L/day His room is really close to the nursing station and he agrees to fluid restriction. Agrees to leave his room door open. At this point, do not see the necessity for one-to-one.  #DM (Discussed with Sital Mody - Hospitalist) - patient refuses insulin. Discussed with hospitalist on call - continue metformin 1000 mg [facility] Diabetes coordinator consult.  #Dyslipidemia - Continue atorvastatin 10 mg daily  #Constipation Continue bisacodyl 5 mg daily PRN  #Insomnia Diphenhydramine 50 mg QHS  Continue trazodone 100 mg QHS  #HTN Continue lisinopril 10 mg daily  #Smoking cessation Refuses  QTc 465 ms  Lipid Panel  No results found for: CHOL, TRIG, HDL, CHOLHDL, VLDL, LDLCALC, LDLDIRECT   Treatment Plan Summary: Daily contact with patient to assess and evaluate symptoms and progress in treatment and Medication management  Aundria Rud, MD 01/13/2017, 11:54 AM

## 2017-01-13 NOTE — B[facility] Counselor (Signed)
Adult Comprehensive Assessment  Patient ID: John Copeland, male   DOB: [birth year], 26 y.o.   MRN: [id number]  Information Source: Information source: Patient  Current Stressors:  Employment / Job issues: Pt reports he is upset that he was unable to interview for a job at [employer]. Housing / Lack of housing: Pt [place]  He has only been there a few days.  Living/Environment/Situation:  Living Arrangements: Assisted living- [facility]  Living conditions (as described by patient or guardian): "its ok:  How long has patient lived in current situation?: few weeks  What is atmosphere in current home: Comfortable  Family History:  Marital status: Single Are you sexually active?: No What is your sexual orientation?: heterosexual Has your sexual activity been affected by drugs, alcohol, medication, or emotional stress?: na Does patient have children?: No  Childhood History:  By whom was/is the patient raised?: Mother, Other (Comment) Additional childhood history information: Pt lived with mother and aunt up until age 51, then went to foster care.  Both mother and aunt died. Description of patient's relationship with caregiver when they were a child: Good relationship with mom. Never met his father.  Good relationship with Aunt. Patient's description of current relationship with people who raised him/her: Pt reports no contact with mother, father, aunt.   How were you disciplined when you got in trouble as a child/adolescent?: Pt reports physical discipline, but appropriate. Does patient have siblings?: Yes Number of Siblings: 4 Description of patient's current relationship with siblings: 2 brothers, 2 sisters.  Sisters are in [place], brother in [place] and one in [place].  No contact. Did patient suffer any verbal/emotional/physical/sexual abuse as a child?: No Did patient suffer from severe childhood neglect?:  No Has patient ever been sexually abused/assaulted/raped as an adolescent or adult?: No Was the patient ever a victim of a crime or a disaster?: No Witnessed domestic violence?: No Has patient been effected by domestic violence as an adult?: No  Education:  Highest grade of school patient has completed: 9th grade Currently a student?: No Learning disability?: No  Employment/Work Situation:   Employment situation: Unemployed What is the longest time patient has a held a job?: 1 month Where was the patient employed at that time?: [employer] Has patient ever been in the [employer]?: No Are There Guns or Other Weapons in [place]?: No  Financial Resources:   Financial resources: [employer], Medicaid Does patient have a Programmer, applications or guardian?: Yes Name of representative payee or guardian: Jerrye Beavers.  Unknown last name.  Alcohol/Substance Abuse:   What has been your use of drugs/alcohol within the last 12 months?: Pt denies any alcohol or drug use. If attempted suicide, did drugs/alcohol play a role in this?: No Alcohol/Substance Abuse Treatment Hx: Denies past history Has alcohol/substance abuse ever caused legal problems?: No  Social Support System:   Patient's Community Support System: None Type of faith/religion: Darrick Meigs How does patient's faith help to cope with current illness?: Don't know.  Leisure/Recreation:   Leisure and Hobbies: I don't know.  Strengths/Needs:   What things does the patient do well?: Pt unable to answer In what areas does patient struggle / problems for patient: I'm not in school and I'm not working.  Discharge Plan:   Does patient have access to transportation?: Yes (Assited living) Will patient be returning to same living situation after discharge?: Yes (Assited living) Currently receiving community mental health services: yes ([facility])  Does patient have financial barriers related to discharge medications?:  No  Summary/Recommendations:    Patient is a 26 year old male admitted  with a diagnosis of Schizoaffective Disorder. Patient presented to the hospital with bizarre behaviors. Patient was unable to identify triggers for admission. Patient will benefit from crisis stabilization, medication evaluation, group therapy and psycho education in addition to case management for discharge. At discharge, it is recommended that patient remain compliant with established discharge plan and continued treatment.   Wray Kearns MSW, LCSW  01/13/2017

## 2017-01-13 NOTE — Plan of Care (Signed)
Problem: Health Behavior/Discharge Planning: Goal: Compliance with therapeutic regimen will improve Outcome: Not Progressing Patient refused HS medications. Does not appear to need Trazodone at this time as he sleeps throughout the night without reported awakenings or disturbances.

## 2017-01-13 NOTE — Progress Notes (Signed)
Inpatient Diabetes Program Recommendations  AACE/ADA: New Consensus Statement on Inpatient Glycemic Control (2015)  Target Ranges:  Prepandial:   less than 140 mg/dL      Peak postprandial:   less than 180 mg/dL (1-2 hours)      Critically ill patients:  140 - 180 mg/dL   Lab Results  Component Value Date   GLUCAP 89 01/12/2017    Review of Glycemic Control  Diabetes history: DM2 Outpatient Diabetes medications: Metformin 1 gm bid Current orders for Inpatient glycemic control: Metformin 1 gm bid  Inpatient Diabetes Program Recommendations:  Received consult. May consider glycemic control orders without correction @ present to evaluate further DM medication needs. Will follow.  Thank you, Billy FischerJudy E. Aldan Camey, RN, MSN, CDE  Diabetes Coordinator Inpatient Glycemic Control Team Team Pager [phone] (8am-5pm) 01/13/2017 9:21 AM

## 2017-01-13 NOTE — Plan of Care (Signed)
Problem: Safety: Goal: Ability to remain free from injury will improve Outcome: Progressing Patient free from falls and any injury this shift. He is isolative and remains in a room close to the nurses station under frequent observation.   Problem: Pain Managment: Goal: General experience of comfort will improve No complaints or visible signs of pain noted.

## 2017-01-13 NOTE — Progress Notes (Signed)
Patient isolated in his room.No interaction with staff & peers.Patient states "I don't want to be around people.I am not comfortable around people".Compliant with medications.Did not attend groups.Did not eat breakfast.Had lunch and dinner.Support & encouragement given.

## 2017-01-14 ENCOUNTER — Encounter: Payer: Self-pay | Admitting: Psychiatry

## 2017-01-14 NOTE — Progress Notes (Signed)
Patient ID: John Copeland, male   DOB: 10-05-1990, 26 y.o.   MRN: [id number]  Patient in bed when approached at the start of shift, poor eye contact, labile and flat in mood and affect, A&Ox1, to self, "I don't know where I am or the day ..." Re-oriented to Place and Time; denied pain, disheveled, littered room with personal belongings; refused to come out for medications at bedtime; remains isolative to room and in bed; monitored for compliance with fluid restrictions; room remains open and with eyesight from the nurses' station.

## 2017-01-14 NOTE — Progress Notes (Signed)
D: Pt remains isolative to his room, minimal interactions with staff on as need basis; medications and meals. Denies SI, HI, AVH and pain  "no, I'm fine". Presents with flat affect and depressed mood on approach. A: Pt encouraged to attend to his ADLs (supplies given) and unit groups. All medications administered as prescribed with verbal education and effects monitored. Q 15 minutes safety checks maintained without self harm / outburst.  R: Pt did not attend groups. Declined breakfast but tolerated lunch well. Pt is medication compliant. Denies adverse drug reactions when assessed. Remains safe on unit. POC continues for safety and mood stability.

## 2017-01-14 NOTE — Progress Notes (Signed)
[facility] MD Progress Note  01/14/2017 12:15 PM John Copeland  MRN:  [id number] Subjective:   01/13/17 - Patient reports that he had a good night and would like to go home. On looking up Care Everywhere, I noticed that he has had multiple ED visits at [facility] in August. The notes from these ED visits detail him his being homeless, having relocated from [place] a month ago and being in different group homes with no corroboration available. They also detailed him as having requested for his medications, mainly Depakote and [facility]. They also detailed that his legal guardian is Sharl Ma in [place]. The numbers to reach them [phone] and [phone].  When I asked patient about these details, he says he does not recall being near [place] initially. Later he confirms that it is possible that he was near [place]. Endorses he may have been in a shelter for some period of time before moving into his current place of stay which is [facility].   His agreeable to fluid restriction and asks if he can go home. Has been noticed to be quiet and isolative in his room with no interactions with other patients. Has been taking his medications.  01/14/17 - endorses that he had a good night is doing well. Has not been observed to be drinking water from the toilet. His agreeable to adhering to his fluid restriction regimen. Will repeat serum sodium tomorrow. Principal Problem: Schizoaffective disorder, bipolar type (HCC) Diagnosis:   Patient Active Problem List   Diagnosis Date Noted  . Schizoaffective disorder (HCC) [F25.9] 01/12/2017  . Polydipsia [R63.1] 01/12/2017  . Diphenhydramine overdose of undetermined intent [T45.0X4A] 01/10/2017  . Tobacco use disorder [F17.200] 10/07/2016  . Diabetes (HCC) [E11.9] 10/07/2016  . HTN (hypertension) [I10] 10/07/2016  . Dyslipidemia [E78.5] 10/07/2016  . Schizoaffective disorder, bipolar type (HCC) [F25.0] 11/30/2015   Total Time spent  with patient: 30 minutes  Past Psychiatric History:  Endorses that the last time he had a normal life was when he was 26 years old. States he does not remember how long he's been on psychiatry medications. Dr. Toni Amend' note states a diagnosis of schizophrenia disorder, multiple hospitalizations and not being happy with the group home situation. Most recent medications of Depakote and Haldol  Past Medical History:  Past Medical History:  Diagnosis Date  . Anxiety   . Depression   . Diabetes mellitus without complication (HCC)   . Schizophrenia (HCC)    History reviewed. No pertinent surgical history. Family History: History reviewed. No pertinent family history. Family Psychiatric  History: Social History:  History  Alcohol Use No     History  Drug Use No    Social History   Social History  . Marital status: Single    Spouse name: N/A  . Number of children: N/A  . Years of education: N/A   Social History Main Topics  . Smoking status: Current Every Day Smoker    Packs/day: 0.50    Types: Cigarettes  . Smokeless tobacco: Never Used  . Alcohol use No  . Drug use: No  . Sexual activity: Not Asked   Other Topics Concern  . None   Social History Narrative  . None   Additional Social History:      Endorses that he is grown up all over [place]. States mom died when he was 57 years old and that he lost a brother too. Says he keeps in touch with his other family members  but is not close with any of them. Is on SSI, says he does not like his assisted living facility but it's okay. Says the last time he worked was at the age of 26 and that was at [employer]. He has not worked after this. Education - ninth grade                    Sleep: Good  Appetite:  Good  Current Medications: Current Facility-Administered Medications  Medication Dose Route Frequency Provider Last Rate Last Dose  . acetaminophen (TYLENOL) tablet 650 mg  650 mg Oral Q6H PRN Aundria Rudakesh,  Vickye Astorino, MD      . albuterol (PROVENTIL) (2.5 MG/3ML) 0.083% nebulizer solution 2.5 mg  2.5 mg Nebulization Q6H PRN Aundria Rudakesh, Yeva Bissette, MD      . alum & mag hydroxide-simeth (MAALOX/MYLANTA) 200-200-20 MG/5ML suspension 30 mL  30 mL Oral Q4H PRN Aundria Rudakesh, Cortlynn Hollinsworth, MD      . atorvastatin (LIPITOR) tablet 10 mg  10 mg Oral q1800 Aundria Rudakesh, Zamir Staples, MD   10 mg at 01/13/17 1654  . benztropine (COGENTIN) tablet 1 mg  1 mg Oral Daily Aundria Rudakesh, Laney Louderback, MD   1 mg at 01/14/17 0918  . bisacodyl (DULCOLAX) EC tablet 5 mg  5 mg Oral Daily PRN Aundria Rudakesh, Sony Schlarb, MD      . diphenhydrAMINE (BENADRYL) capsule 50 mg  50 mg Oral QHS Aundria Rudakesh, Flornce Record, MD      . divalproex (DEPAKOTE ER) 24 hr tablet 1,000 mg  1,000 mg Oral BID Aundria Rudakesh, Gerrit Rafalski, MD   1,000 mg at 01/14/17 [id number]  . haloperidol (HALDOL) tablet 5 mg  5 mg Oral TID Aundria Rudakesh, Rokhaya Quinn, MD   5 mg at 01/14/17 0919  . ipratropium (ATROVENT) nebulizer solution 0.5 mg  0.5 mg Nebulization Q6H PRN Aundria Rudakesh, Sahiba Granholm, MD      . lisinopril (PRINIVIL,ZESTRIL) tablet 10 mg  10 mg Oral Daily Aundria Rudakesh, Barbarajean Kinzler, MD   10 mg at 01/14/17 0918  . magnesium hydroxide (MILK OF MAGNESIA) suspension 30 mL  30 mL Oral Daily PRN Aundria Rudakesh, Tranice Laduke, MD      . metFORMIN (GLUCOPHAGE) tablet 1,000 mg  1,000 mg Oral BID WC Aundria Rudakesh, Konnor Vondrasek, MD   1,000 mg at 01/14/17 [id number]  . multivitamin with minerals tablet 1 tablet  1 tablet Oral Daily Aundria Rudakesh, Darrielle Pflieger, MD   1 tablet at 01/14/17 0919  . ondansetron (ZOFRAN) tablet 4 mg  4 mg Oral Q8H PRN Aundria Rudakesh, Truong Delcastillo, MD      . traZODone (DESYREL) tablet 100 mg  100 mg Oral QHS Aundria Rudakesh, Satomi Buda, MD        Lab Results:  Results for orders placed or performed during the hospital encounter of 01/12/17 (from the past 48 hour(s))  Sodium     Status: None   Collection Time: 01/13/17  6:34 AM  Result Value Ref Range   Sodium 140 135 - 145 mmol/L    Blood Alcohol level:  Lab Results  Component Value Date   ETH <5 01/11/2017    ETH <5 01/10/2017    Metabolic Disorder Labs: No results found for: HGBA1C, MPG No results found for: PROLACTIN No results found for: CHOL, TRIG, HDL, CHOLHDL, VLDL, LDLCALC  Physical Findings: AIMS: Facial and Oral Movements Muscles of Facial Expression: None, normal Lips and Perioral Area: None, normal Jaw: None, normal Tongue: None, normal,Extremity Movements Upper (arms, wrists, hands, fingers): None, normal Lower (legs, knees, ankles, toes): None, normal, Trunk Movements Neck, shoulders, hips: None, normal, Overall Severity Severity of abnormal  movements (highest score from questions above): None, normal Incapacitation due to abnormal movements: None, normal Patient's awareness of abnormal movements (rate only patient's report): No Awareness, Dental Status Current problems with teeth and/or dentures?: No Does patient usually wear dentures?: No  CIWA:    COWS:     Musculoskeletal: Strength & Muscle Tone: within normal limits Gait & Station: normal Patient leans: N/A  Psychiatric Specialty Exam: Physical Exam  ROS  Blood pressure 106/65, pulse (!) 58, temperature 98.1 F (36.7 C), temperature source Oral, resp. rate 16, height 6' (1.829 m), weight 190 lb (86.2 kg), SpO2 98 %.Body mass index is 25.77 kg/m.  General Appearance: Guarded  Eye Contact:  Good  Speech:  Normal Rate  Volume:  Normal  Mood:  Dysphoric  Affect:  Flat  Thought Process:  Coherent  Orientation:  Full (Time, Place, and Person)  Thought Content:  deies  Suicidal Thoughts:  No  Homicidal Thoughts:  No  Judgement:  Poor  Insight:  Fair  Psychomotor Activity:  Normal  AIMS (if indicated):     Assets:  Others:  He is compliant on his medications  ADL's:  Intact  Cognition:  Not assessed  Sleep:  Number of Hours: 45.48   26 year old gentleman with a history of schizoaffective disorder presenting to the ER on the 31st after? Benadryl overdose. Serum sodium was 120 on presentation and he was  admitted to the medical floor for correction of hyponatremia. Polydipsia seems to be possible differential, he did detail and he was drinking a lot of water. Endorses that he does not remember how he reached the emergency room after admission. Overall he is compliant to medications, discrepancies exist in his accounts where he is staying or even if he has a place to go back to. I left a message for his guardian in [facility] and this will need to be followed up on Tuesday. At present he has a flat affect, apart from being guarded I cannot elicit any other acute psychopathology.  #Schizoaffective disorder - Continue divalproex 1000 mg BID - Continue haldol 5 mg TID - Continue benztropine 1 mg daily  #Polydipsia  Urine osmolality of 37 and serum osmolality of 268 Serum sodium on 01/13/17 is 140. Repeat serum sodium on 01/15/17.  Fluid restriction to <1.5 L/day His room is really close to the nursing station and he agrees to fluid restriction. Agrees to leave his room door open. At this point, do not see the necessity for one-to-one.  #DM (Discussed with Sital Mody - Hospitalist) - patient refuses insulin. Discussed with hospitalist on call - continue metformin 1000 mg [facility] Diabetes coordinator consult.  #Dyslipidemia - Continue atorvastatin 10 mg daily  #Constipation Continue bisacodyl 5 mg daily PRN  #Insomnia Diphenhydramine 50 mg QHS  Continue trazodone 100 mg QHS  #HTN Continue lisinopril 10 mg daily  #Smoking cessation Refuses  QTc 465 ms  Lipid Panel  No results found for: CHOL, TRIG, HDL, CHOLHDL, VLDL, LDLCALC, LDLDIRECT   Treatment Plan Summary: Daily contact with patient to assess and evaluate symptoms and progress in treatment and Medication management  Aundria Rud, MD 01/14/2017, 12:15 PM

## 2017-01-14 NOTE — [facility] Group Notes (Signed)
[facility] LCSW Group Therapy Note  Date/Time: 01/14/2017  Type of Therapy and Topic:  Group Therapy:  Overcoming Obstacles  Participation Level:  Did Not Attend  Ahmar Pickrell, LCSW   

## 2017-01-15 DIAGNOSIS — F250 Schizoaffective disorder, bipolar type: Secondary | ICD-10-CM

## 2017-01-15 NOTE — Progress Notes (Signed)
Patient ID: John MuffJoshua Omar Haws, male   DOB: 10-Jun-1990, 26 y.o.   MRN: [id number] CS[facility]  Talked with staff there who state they have not given Pt 30 day notice and they understand that ACTT team is working to find new placement.  CSW informed them that Pt would still be discharged back there if another placement was not located prior to discharge and could be arranged from group home to group home by ACTT team.  Staff verbalizes understanding.  Jake SharkSara Bree Heinzelman, LCSW

## 2017-01-15 NOTE — [facility] Group Notes (Signed)
[facility] Group Notes:  (Nursing/MHT/Case Management/Adjunct)  Date:  01/15/2017  Time:  10:33 AM  Type of Therapy:  Psychoeducational Skills  Participation Level:  Did Not Attend    Summary of Progress/Problems:  [name] 01/15/2017, 10:33 AM

## 2017-01-15 NOTE — Progress Notes (Signed)
Patient stayed in bed till the afternoon except for meals.Selectively mute and no interactions with staff & peers.Patient did not even want to get up for medications.But patient came to the medications room for evening meds.Denies suicidal or homicidal ideations and AV hallucinations.Compliant with medications.Did not attend any groups.Appetite and energy level good.Support & encouragement given.

## 2017-01-15 NOTE — [facility] Suicide Risk Assessment (Signed)
[facility] INPATIENT:  Family/Significant Other Suicide Prevention Education  Suicide Prevention Education: Patient to return to his current group home/ALF  [facility] by [employer]. Patient Discharged to Other Healthcare Facility:  Suicide Prevention Education Not Provided: {PT. DISCHARGED TO OTHER HEALTHCARE FACILITY:SUICIDE PREVENTION EDUCATION NOT PROVIDED (CHL):  The patient is discharging to another healthcare facility for continuation of treatment.  The patient's medical information, including suicide ideations and risk factors, are a part of the medical information shared with the receiving healthcare facility. Discussed with guardian: [name] with [facility], patient guardian via phone  [phone].  Raye SorrowCoble, Dontrell Stuck N 01/15/2017, 4:34 PM

## 2017-01-15 NOTE — Progress Notes (Signed)
[facility] MD Progress Note  01/15/2017 2:39 PM John Copeland  MRN:  [id number] Subjective:   01/13/17 - Patient reports that he had a good night and would like to go home. On looking up Care Everywhere, I noticed that he has had multiple ED visits at [facility] in August. The notes from these ED visits detail him his being homeless, having relocated from [place] a month ago and being in different group homes with no corroboration available. They also detailed him as having requested for his medications, mainly Depakote and [facility]. They also detailed that his legal guardian is Sharl Ma in [place]. The numbers to reach them [phone] and [phone]. When I asked patient about these details, he says he does not recall being near [place] initially. Later he confirms that it is possible that he was near [place]. Endorses he may have been in a shelter for some period of time before moving into his current place of stay which is [facility]. His agreeable to fluid restriction and asks if he can go home. Has been noticed to be quiet and isolative in his room with no interactions with other patients. Has been taking his medications.  01/14/17 - endorses that he had a good night is doing well. Has not been observed to be drinking water from the toilet. His agreeable to adhering to his fluid restriction regimen. Will repeat serum sodium tomorrow.  01/15/2017. John Copeland seems confused this morning. He refused to meet with treatment team. He kept his head under the covers when I tried to talk to him. He is "fine". He came to the hospital because "he did not feel right". He claims good complaince with medications but is unable to name any. He does not know his group home, how long he has been there, or how big it is. He has no somatic complaints and is ready to go home. Reports good sleep and appetite. SW spoke with the group home. The patient does not like it there and has been calling  [phone] repeatedly to get out. His ACT team reportedly working on new placement but the patient is alowed to return to his group home for now. Unable to reach his guardian, Sharl Ma, as of yet.   Per nursing: Patient in bed when approached at the start of shift, poor eye contact, labile and flat in mood and affect, A&Ox1, to self, "I don't know where I am or the day ..." Re-oriented to Place and Time; denied pain, disheveled, littered room with personal belongings; refused to come out for medications at bedtime; remains isolative to room and in bed; monitored for compliance with fluid restrictions; room remains open and with eyesight from the nurses' station.  Principal Problem: Schizoaffective disorder, bipolar type (HCC) Diagnosis:   Patient Active Problem List   Diagnosis Date Noted  . Polydipsia [R63.1] 01/12/2017  . Diphenhydramine overdose of undetermined intent [T45.0X4A] 01/10/2017  . Tobacco use disorder [F17.200] 10/07/2016  . Diabetes (HCC) [E11.9] 10/07/2016  . HTN (hypertension) [I10] 10/07/2016  . Dyslipidemia [E78.5] 10/07/2016  . Schizoaffective disorder, bipolar type (HCC) [F25.0] 11/30/2015   Total Time spent with patient: 30 minutes  Past Psychiatric History:  Endorses that the last time he had a normal life was when he was 26 years old. States he does not remember how long he's been on psychiatry medications. Dr. Toni Amend' note states a diagnosis of schizophrenia disorder, multiple hospitalizations and not being happy with the group home situation. Most recent medications  of Depakote and Haldol  Past Medical History:  Past Medical History:  Diagnosis Date  . Anxiety   . Depression   . Diabetes mellitus without complication (HCC)   . Schizophrenia (HCC)    History reviewed. No pertinent surgical history. Family History: History reviewed. No pertinent family history. Family Psychiatric  History: Social History:  History  Alcohol Use No     History  Drug Use No     Social History   Social History  . Marital status: Single    Spouse name: N/A  . Number of children: N/A  . Years of education: N/A   Social History Main Topics  . Smoking status: Current Every Day Smoker    Packs/day: 0.50    Types: Cigarettes  . Smokeless tobacco: Never Used  . Alcohol use No  . Drug use: No  . Sexual activity: Not Asked   Other Topics Concern  . None   Social History Narrative  . None   Additional Social History:      Endorses that he is grown up all over [place]. States mom died when he was 42 years old and that he lost a brother too. Says he keeps in touch with his other family members but is not close with any of them. Is on SSI, says he does not like his assisted living facility but it's okay. Says the last time he worked was at the age of 67 and that was at [employer]. He has not worked after this. Education - ninth grade                    Sleep: Good  Appetite:  Good  Current Medications: Current Facility-Administered Medications  Medication Dose Route Frequency Provider Last Rate Last Dose  . acetaminophen (TYLENOL) tablet 650 mg  650 mg Oral Q6H PRN Aundria Rud, MD      . albuterol (PROVENTIL) (2.5 MG/3ML) 0.083% nebulizer solution 2.5 mg  2.5 mg Nebulization Q6H PRN Aundria Rud, MD      . alum & mag hydroxide-simeth (MAALOX/MYLANTA) 200-200-20 MG/5ML suspension 30 mL  30 mL Oral Q4H PRN Aundria Rud, MD      . atorvastatin (LIPITOR) tablet 10 mg  10 mg Oral q1800 Aundria Rud, MD   10 mg at 01/14/17 1712  . benztropine (COGENTIN) tablet 1 mg  1 mg Oral Daily Aundria Rud, MD   1 mg at 01/15/17 0859  . bisacodyl (DULCOLAX) EC tablet 5 mg  5 mg Oral Daily PRN Aundria Rud, MD      . diphenhydrAMINE (BENADRYL) capsule 50 mg  50 mg Oral QHS Aundria Rud, MD      . divalproex (DEPAKOTE ER) 24 hr tablet 1,000 mg  1,000 mg Oral BID Aundria Rud, MD   1,000 mg at 01/15/17 0859  .  haloperidol (HALDOL) tablet 5 mg  5 mg Oral TID Aundria Rud, MD   5 mg at 01/15/17 1150  . ipratropium (ATROVENT) nebulizer solution 0.5 mg  0.5 mg Nebulization Q6H PRN Aundria Rud, MD      . lisinopril (PRINIVIL,ZESTRIL) tablet 10 mg  10 mg Oral Daily Aundria Rud, MD   10 mg at 01/15/17 0859  . magnesium hydroxide (MILK OF MAGNESIA) suspension 30 mL  30 mL Oral Daily PRN Aundria Rud, MD      . metFORMIN (GLUCOPHAGE) tablet 1,000 mg  1,000 mg Oral BID WC Aundria Rud, MD   1,000 mg at 01/15/17 0859  . multivitamin with  minerals tablet 1 tablet  1 tablet Oral Daily Aundria Rud, MD   1 tablet at 01/15/17 0859  . ondansetron (ZOFRAN) tablet 4 mg  4 mg Oral Q8H PRN Aundria Rud, MD      . traZODone (DESYREL) tablet 100 mg  100 mg Oral QHS Aundria Rud, MD        Lab Results:  No results found for this or any previous visit (from the past 48 hour(s)).  Blood Alcohol level:  Lab Results  Component Value Date   ETH <5 01/11/2017   ETH <5 01/10/2017    Metabolic Disorder Labs: No results found for: HGBA1C, MPG No results found for: PROLACTIN No results found for: CHOL, TRIG, HDL, CHOLHDL, VLDL, LDLCALC  Physical Findings: AIMS: Facial and Oral Movements Muscles of Facial Expression: None, normal Lips and Perioral Area: None, normal Jaw: None, normal Tongue: None, normal,Extremity Movements Upper (arms, wrists, hands, fingers): None, normal Lower (legs, knees, ankles, toes): None, normal, Trunk Movements Neck, shoulders, hips: None, normal, Overall Severity Severity of abnormal movements (highest score from questions above): None, normal Incapacitation due to abnormal movements: None, normal Patient's awareness of abnormal movements (rate only patient's report): No Awareness, Dental Status Current problems with teeth and/or dentures?: No Does patient usually wear dentures?: No  CIWA:    COWS:     Musculoskeletal: Strength & Muscle  Tone: within normal limits Gait & Station: normal Patient leans: N/A  Psychiatric Specialty Exam: Physical Exam  Nursing note and vitals reviewed. Psychiatric: His affect is blunt. His speech is delayed. He is slowed, withdrawn and actively hallucinating. Thought content is paranoid and delusional. Cognition and memory are normal. He expresses impulsivity.    Review of Systems  Psychiatric/Behavioral: Positive for hallucinations.  All other systems reviewed and are negative.   Blood pressure 106/65, pulse (!) 58, temperature 98.1 F (36.7 C), temperature source Oral, resp. rate 16, height 6' (1.829 m), weight 86.2 kg (190 lb), SpO2 98 %.Body mass index is 25.77 kg/m.  General Appearance: Guarded  Eye Contact:  Good  Speech:  Normal Rate  Volume:  Normal  Mood:  Dysphoric  Affect:  Flat  Thought Process:  Coherent  Orientation:  Full (Time, Place, and Person)  Thought Content:  deies  Suicidal Thoughts:  No  Homicidal Thoughts:  No  Judgement:  Poor  Insight:  Fair  Psychomotor Activity:  Normal  AIMS (if indicated):     Assets:  Others:  He is compliant on his medications  ADL's:  Intact  Cognition:  Not assessed  Sleep:  Number of Hours: 8.3   Treatment Plan Summary: Daily contact with patient to assess and evaluate symptoms and progress in treatment and Medication management   John Copeland is 26 year old gentleman with a history of schizoaffective disorder presenting to the ER on the 31st after Benadryl overdose and hyponatremia. Endorses that he does not remember how he reached the emergency room after admission.  At present he has a flat affect, apart from being guarded I cannot elicit any other acute psychopathology.  1. Suicidal ideation. The patient is able to contract for safety in the hospital.  2. Mood and psychosis. He was continued on Haldol, Cogentin and Depakote for psychosis and mood stabilization.   3. Hyponatremia. Resolved.  4. Diabetes. The patient  refuses insulin. We continued metformin.  5. Dyslipidemia. Continue atorvastatin.   6. Constipation. Continue bisacodyl.   7. Insomnia. Continue trazodone.  8. HTN. Continue lisinopril.  9. Smoking cessation. Nico5ine patch  is available.   10. Metabolic syndrome monitoring. Lipid panel, TSH and HGbA1C are pending.  11. EKG. Normal sinus rhythm, QTc 465.  12. Disposition. He will be discharged to his group home. Follow up with ACT team.  Kristine LineaJolanta Keymon Mcelroy, MD 01/15/2017, 2:39 PM

## 2017-01-15 NOTE — Plan of Care (Signed)
Problem: Activity: Goal: Interest or engagement in activities will improve Outcome: Not Progressing Not showing any interest in activities.  Problem: Education: Goal: Emotional status will improve Outcome: Not Progressing Patient remained selectively mute and shows unwillingness to participate in treatment plan.  Problem: Medication: Goal: Compliance with prescribed medication regimen will improve Outcome: Progressing Patient compliant with medications.

## 2017-01-15 NOTE — Progress Notes (Signed)
LCSW spoke with [name] (Guardian since 2014). Patient was recently discharged [facility] after a three week stay and placed at [facility] in [place].  Patient did not like the placement and left on his own accord for another three weeks and was homeless. Patient would check in with guardian via shelter and phones and then received placement at [facility]. Patient plans to return to group home per guardian as he has not been formerally discharged, however there is mention of new placement in another group home owned by [facility], but no current beds available at this time.   Patient has been on long acting IM Ativan which was successful in past and guardian questioning if this is an option and ACT team can manage?.  Will discuss with MD.  Patient need new updated FL2 at discharge. His passar:  [phone]. Fax for guardian:  [phone]  No other needs, Guardian updated on patient's behaviors and current treatment. Agreeable with all treatment.  Deretha EmoryHannah Daksha Koone LCSW, MSW Clinical Social Work: Optician, dispensingystem [employer]

## 2017-01-15 NOTE — Progress Notes (Signed)
Patient ID: John MuffJoshua Omar Pujol, male   DOB: June 26, 1990, 26 y.o.   MRN: [id number] CSW left voicemail with Pt's guardian [name] asking call back to discuss Pt's status ast family care home.   Jake SharkSara Trayson Stitely, LCSW

## 2017-01-15 NOTE — Plan of Care (Signed)
Problem: Activity: Goal: Sleeping patterns will improve Outcome: Progressing Patient slept for Estimated Hours of 8.30; Precautionary checks every 15 minutes for safety maintained, room free of safety hazards, patient sustains no injury or falls during this shift.

## 2017-01-15 NOTE — Tx Team (Signed)
Interdisciplinary Treatment and Diagnostic Plan Update  01/15/2017 Time of Session: 11am John Copeland MRN: [id number]  Principal Diagnosis: Schizoaffective disorder, bipolar type [facility])  Secondary Diagnoses: Principal Problem:   Schizoaffective disorder, bipolar type (HCC) Active Problems:   Tobacco use disorder   Diabetes (HCC)   HTN (hypertension)   Dyslipidemia   Diphenhydramine overdose of undetermined intent   Polydipsia   Current Medications:  Current Facility-Administered Medications  Medication Dose Route Frequency Provider Last Rate Last Dose  . acetaminophen (TYLENOL) tablet 650 mg  650 mg Oral Q6H PRN Aundria Rud, MD      . albuterol (PROVENTIL) (2.5 MG/3ML) 0.083% nebulizer solution 2.5 mg  2.5 mg Nebulization Q6H PRN Aundria Rud, MD      . alum & mag hydroxide-simeth (MAALOX/MYLANTA) 200-200-20 MG/5ML suspension 30 mL  30 mL Oral Q4H PRN Aundria Rud, MD      . atorvastatin (LIPITOR) tablet 10 mg  10 mg Oral q1800 Aundria Rud, MD   10 mg at 01/14/17 1712  . benztropine (COGENTIN) tablet 1 mg  1 mg Oral Daily Aundria Rud, MD   1 mg at 01/15/17 0859  . bisacodyl (DULCOLAX) EC tablet 5 mg  5 mg Oral Daily PRN Aundria Rud, MD      . diphenhydrAMINE (BENADRYL) capsule 50 mg  50 mg Oral QHS Aundria Rud, MD      . divalproex (DEPAKOTE ER) 24 hr tablet 1,000 mg  1,000 mg Oral BID Aundria Rud, MD   1,000 mg at 01/15/17 0859  . haloperidol (HALDOL) tablet 5 mg  5 mg Oral TID Aundria Rud, MD   5 mg at 01/15/17 1150  . ipratropium (ATROVENT) nebulizer solution 0.5 mg  0.5 mg Nebulization Q6H PRN Aundria Rud, MD      . lisinopril (PRINIVIL,ZESTRIL) tablet 10 mg  10 mg Oral Daily Aundria Rud, MD   10 mg at 01/15/17 0859  . magnesium hydroxide (MILK OF MAGNESIA) suspension 30 mL  30 mL Oral Daily PRN Aundria Rud, MD      . metFORMIN (GLUCOPHAGE) tablet 1,000 mg  1,000 mg Oral BID WC Aundria Rud, MD   1,000 mg at 01/15/17 0859  . multivitamin with minerals tablet 1 tablet  1 tablet Oral Daily Aundria Rud, MD   1 tablet at 01/15/17 0859  . ondansetron (ZOFRAN) tablet 4 mg  4 mg Oral Q8H PRN Aundria Rud, MD      . traZODone (DESYREL) tablet 100 mg  100 mg Oral QHS Aundria Rud, MD       PTA Medications: Prescriptions Prior to Admission  Medication Sig Dispense Refill Last Dose  . diphenhydrAMINE (BENADRYL) 50 MG capsule Take 1 capsule (50 mg total) by mouth at bedtime. 30 capsule 0 Past Month at Unknown time  . divalproex (DEPAKOTE ER) 500 MG 24 hr tablet Take 2 tablets (1,000 mg total) by mouth 2 (two) times daily. 120 tablet 0 Past Month at Unknown time    Patient Stressors: Educational concerns Occupational concerns  Patient Strengths: Other: Patient has congnitive delayed function  Treatment Modalities: Medication Management, Group therapy, Case management,  1 to 1 session with clinician, Psychoeducation, Recreational therapy.   Physician Treatment Plan for Primary Diagnosis: Schizoaffective disorder, bipolar type (HCC) Long Term Goal(s): Improvement in symptoms so as ready for discharge Improvement in symptoms so as ready for discharge   Short Term Goals: psychoeducation on importance of medications. Compliance with prescribed medications will improve  Medication Management: Evaluate patient's response, side effects, and tolerance  of medication regimen.  Therapeutic Interventions: 1 to 1 sessions, Unit Group sessions and Medication administration.  Evaluation of Outcomes: Progressing  Physician Treatment Plan for Secondary Diagnosis: Principal Problem:   Schizoaffective disorder, bipolar type (HCC) Active Problems:   Tobacco use disorder   Diabetes (HCC)   HTN (hypertension)   Dyslipidemia   Diphenhydramine overdose of undetermined intent   Polydipsia  Long Term Goal(s): Improvement in symptoms so as ready for  discharge Improvement in symptoms so as ready for discharge   Short Term Goals: psychoeducation on importance of medications. Compliance with prescribed medications will improve     Medication Management: Evaluate patient's response, side effects, and tolerance of medication regimen.  Therapeutic Interventions: 1 to 1 sessions, Unit Group sessions and Medication administration.  Evaluation of Outcomes: Progressing   RN Treatment Plan for Primary Diagnosis: Schizoaffective disorder, bipolar type (HCC) Long Term Goal(s): Knowledge of disease and therapeutic regimen to maintain health will improve  Short Term Goals: Ability to remain free from injury will improve  Medication Management: RN will administer medications as ordered by provider, will assess and evaluate patient's response and provide education to patient for prescribed medication. RN will report any adverse and/or side effects to prescribing provider.  Therapeutic Interventions: 1 on 1 counseling sessions, Psychoeducation, Medication administration, Evaluate responses to treatment, Monitor vital signs and CBGs as ordered, Perform/monitor CIWA, COWS, AIMS and Fall Risk screenings as ordered, Perform wound care treatments as ordered.  Evaluation of Outcomes: Progressing   LCSW Treatment Plan for Primary Diagnosis: Schizoaffective disorder, bipolar type (HCC) Long Term Goal(s): Safe transition to appropriate next level of care at discharge, Engage patient in therapeutic group addressing interpersonal concerns.  Short Term Goals: Engage patient in aftercare planning with referrals and resources  Therapeutic Interventions: Assess for all discharge needs, 1 to 1 time with Social worker, Explore available resources and support systems, Assess for adequacy in community support network, Educate family and significant other(s) on suicide prevention, Complete Psychosocial Assessment, Interpersonal group therapy.  Evaluation of Outcomes:  Not Progressing   Progress in Treatment: Attending groups: No. Participating in groups: No. Taking medication as prescribed: Yes. Toleration medication: Yes. Family/Significant other contact made: No, will contact:  once patient gives consent Patient understands diagnosis: Yes. Discussing patient identified problems/goals with staff: No. Medical problems stabilized or resolved: Yes. Denies suicidal/homicidal ideation: Yes. Issues/concerns per patient self-inventory: Yes. Other:   New problem(s) identified: No, Describe:  none reported  New Short Term/Long Term Goal(s):  Discharge Plan or Barriers:  Assessing for appropriate referrals and after care.  Currently active with [facility]  Reason for Continuation of Hospitalization: Depression Other; describe Paranoia, hallucinations  Estimated Length of Stay:  3-5 days  Attendees: Patient:  Refused to come 01/15/2017 3:06 PM  Physician: Dr. Enriqueta ShutterPucilowaska, J, MD 01/15/2017 3:06 PM  Nursing:  Marylu LundJanet, RN 01/15/2017 3:06 PM  RN Care Manager: 01/15/2017 3:06 PM  Social Worker: Jake SharkSara Laws, LCSW 01/15/2017 3:06 PM  Recreational Therapist:  01/15/2017 3:06 PM  Other:  01/15/2017 3:06 PM  Other:  01/15/2017 3:06 PM  Other: 01/15/2017 3:06 PM    Scribe for Treatment Team: Raye Sorrowoble, Yona Kosek N, LCSW 01/15/2017 3:06 PM

## 2017-01-16 MED ORDER — HALOPERIDOL 5 MG PO TABS
10.0000 mg | ORAL_TABLET | Freq: Two times a day (BID) | ORAL | Status: DC
  Administered 2017-01-16 – 2017-01-21 (×11): 10 mg via ORAL
  Filled 2017-01-16 (×11): qty 2

## 2017-01-16 NOTE — Progress Notes (Signed)
Pt continues to be isolative. Pt stayed in room entire shift except to take medications. Pt effect is flat. Brief eye contact. Pt is med compliant. Denies depression, SI, HI, or A/V hallucinations. 15 min safety checks continue.

## 2017-01-16 NOTE — Plan of Care (Signed)
Problem: Activity: Goal: Interest or engagement in activities will improve Outcome: Not Progressing Not attending any groups.  Problem: Coping: Goal: Ability to verbalize frustrations and anger appropriately will improve Outcome: Progressing Patient verbalized that he feels better.

## 2017-01-16 NOTE — Progress Notes (Signed)
Patient did not get out of bed for medicine even with much encouragement.Stayed in bed with head covered.Visible in the milieu this evening.Stated that he feels better.Denies suicidal or homicidal ideations and AV hallucinations.Patient came to staff & asked for hygiene products.Compliant with medications.Did not attend groups.Appetite and energy level good.Support & encouragement given.

## 2017-01-16 NOTE — Progress Notes (Signed)
Patient ID: John Copeland, male   DOB: Apr 29, 1991, 26 y.o.   MRN: [id number] Flat, sad, distant, no eye contact, denied pain, isolative to room except during snacks and medication time; poorly kept appearance with poor hygiene; will continue to encourage ADL upkeep and treatment (labs, fluid restriction) compliance.

## 2017-01-16 NOTE — [facility] Group Notes (Signed)
[facility] LCSW Group Therapy Note  Date/Time Sept 5th from 9:30-10:15 Type of Therapy/Topic: Group Therapy: Emotion Regulation  Participation Level: Patient was invited to attend but did not participate   Tykira Wachs LCSW 

## 2017-01-16 NOTE — [facility] Group Notes (Signed)
[facility] Group Notes:  (Nursing/MHT/Case Management/Adjunct)  Date:  01/16/2017  Time:  2:26 PM  Type of Therapy:  Psychoeducational Skills  Participation Level:  Did Not Attend   John Copeland  John Copeland 01/16/2017, 2:26 PM

## 2017-01-16 NOTE — Plan of Care (Signed)
Problem: Safety: Goal: Ability to remain free from injury will improve Outcome: Progressing Pt will remain injury free the entire shift.   

## 2017-01-16 NOTE — Progress Notes (Signed)
[facility] MD Progress Note  01/16/2017 11:26 AM John Copeland  MRN:  [id number] Subjective:   01/13/17 - Patient reports that he had a good night and would like to go home. On looking up Care Everywhere, I noticed that he has had multiple ED visits at [facility] in August. The notes from these ED visits detail him his being homeless, having relocated from [place] a month ago and being in different group homes with no corroboration available. They also detailed him as having requested for his medications, mainly Depakote and trazodone from [facility]. They also detailed that his legal guardian is Sharl Ma in [place]. The numbers to reach them [phone] and [phone]. When I asked patient about these details, he says he does not recall being near [place] initially. Later he confirms that it is possible that he was near [place]. Endorses he may have been in a shelter for some period of time before moving into his current place of stay which is [place] [facility]. His agreeable to fluid restriction and asks if he can go home. Has been noticed to be quiet and isolative in his room with no interactions with other patients. Has been taking his medications.  01/14/17 - endorses that he had a good night is doing well. Has not been observed to be drinking water from the toilet. His agreeable to adhering to his fluid restriction regimen. Will repeat serum sodium tomorrow.  01/15/2017. John Copeland seems confused this morning. He refused to meet with treatment team. He kept his head under the covers when I tried to talk to him. He is "fine". He came to the hospital because "he did not feel right". He claims good complaince with medications but is unable to name any. He does not know his group home, how long he has been there, or how big it is. He has no somatic complaints and is ready to go home. Reports good sleep and appetite. SW spoke with the group home. The patient does not like it there and has been calling  911 repeatedly to get out. His ACT team reportedly working on new placement but the patient is alowed to return to his group home for now. Unable to reach his guardian, Sharl Ma, as of yet.   01/16/2017. John Copeland is in bed today with his head covered. He initially tells me that he is "OK" but does not answer any other questions. This is in contrast to our interaction yesterday when he insisted on going home and was more engaging. Per nursing, he only gets out of bed for meals and does not come to medication room without much encouragement. He was likely noncompliant with treatment prior to admission as his VPA level on admission was only 21. On the other hand, hyponatremia could have been caused by Depakote. He refused labs yesterday and today to recheck his sodium. This is important as he was restarted on 1000 mg twice daily of Depakote.  Per nursing: Flat, sad, distant, no eye contact, denied pain, isolative to room except during snacks and medication time; poorly kept appearance with poor hygiene; will continue to encourage ADL upkeep and treatment (labs, fluid restriction) compliance.  Principal Problem: Schizoaffective disorder, bipolar type (HCC) Diagnosis:   Patient Active Problem List   Diagnosis Date Noted  . Polydipsia [R63.1] 01/12/2017  . Diphenhydramine overdose of undetermined intent [T45.0X4A] 01/10/2017  . Tobacco use disorder [F17.200] 10/07/2016  . Diabetes (HCC) [E11.9] 10/07/2016  . HTN (hypertension) [I10] 10/07/2016  . Dyslipidemia [E78.5]  10/07/2016  . Schizoaffective disorder, bipolar type (HCC) [F25.0] 11/30/2015   Total Time spent with patient: 30 minutes  Past Psychiatric History:  Endorses that the last time he had a normal life was when he was 26 years old. States he does not remember how long he's been on psychiatry medications. Dr. Toni Amendlapacs' note states a diagnosis of schizophrenia disorder, multiple hospitalizations and not being happy with the group home situation.  Most recent medications of Depakote and Haldol  Past Medical History:  Past Medical History:  Diagnosis Date  . Anxiety   . Depression   . Diabetes mellitus without complication (HCC)   . Schizophrenia (HCC)    History reviewed. No pertinent surgical history. Family History: History reviewed. No pertinent family history. Family Psychiatric  History: Social History:  History  Alcohol Use No     History  Drug Use No    Social History   Social History  . Marital status: Single    Spouse name: N/A  . Number of children: N/A  . Years of education: N/A   Social History Main Topics  . Smoking status: Current Every Day Smoker    Packs/day: 0.50    Types: Cigarettes  . Smokeless tobacco: Never Used  . Alcohol use No  . Drug use: No  . Sexual activity: Not Asked   Other Topics Concern  . None   Social History Narrative  . None   Additional Social History:      Endorses that he is [place] up [place] over [place]. States mom died when he was 26 years old and that he lost a brother too. Says he keeps in touch with his other family members but is not close with any of them. Is on SSI, says he does not like his assisted living facility but it's okay. Says the last time he worked was at the age of 26 and that was at [employer]. He has not worked after this. Education - ninth grade                    Sleep: Good  Appetite:  Good  Current Medications: Current Facility-Administered Medications  Medication Dose Route Frequency Provider Last Rate Last Dose  . acetaminophen (TYLENOL) tablet 650 mg  650 mg Oral Q6H PRN Aundria Rudakesh, Gopalkumar, MD      . albuterol (PROVENTIL) (2.5 MG/3ML) 0.083% nebulizer solution 2.5 mg  2.5 mg Nebulization Q6H PRN Aundria Rudakesh, Gopalkumar, MD      . alum & mag hydroxide-simeth (MAALOX/MYLANTA) 200-200-20 MG/5ML suspension 30 mL  30 mL Oral Q4H PRN Aundria Rudakesh, Gopalkumar, MD      . atorvastatin (LIPITOR) tablet 10 mg  10 mg Oral q1800 Aundria Rudakesh,  Gopalkumar, MD   10 mg at 01/15/17 1657  . benztropine (COGENTIN) tablet 1 mg  1 mg Oral Daily Aundria Rudakesh, Gopalkumar, MD   1 mg at 01/16/17 0948  . bisacodyl (DULCOLAX) EC tablet 5 mg  5 mg Oral Daily PRN Aundria Rudakesh, Gopalkumar, MD      . diphenhydrAMINE (BENADRYL) capsule 50 mg  50 mg Oral QHS Aundria Rudakesh, Gopalkumar, MD   50 mg at 01/15/17 2120  . divalproex (DEPAKOTE ER) 24 hr tablet 1,000 mg  1,000 mg Oral BID Aundria Rudakesh, Gopalkumar, MD   1,000 mg at 01/16/17 0948  . haloperidol (HALDOL) tablet 10 mg  10 mg Oral BID Sally Menard B, MD      . ipratropium (ATROVENT) nebulizer solution 0.5 mg  0.5 mg Nebulization Q6H PRN Aundria Rudakesh, Gopalkumar,  MD      . lisinopril (PRINIVIL,ZESTRIL) tablet 10 mg  10 mg Oral Daily Aundria Rud, MD   10 mg at 01/16/17 0949  . magnesium hydroxide (MILK OF MAGNESIA) suspension 30 mL  30 mL Oral Daily PRN Aundria Rud, MD      . metFORMIN (GLUCOPHAGE) tablet 1,000 mg  1,000 mg Oral BID WC Aundria Rud, MD   1,000 mg at 01/16/17 0948  . multivitamin with minerals tablet 1 tablet  1 tablet Oral Daily Aundria Rud, MD   1 tablet at 01/16/17 0948  . traZODone (DESYREL) tablet 100 mg  100 mg Oral QHS Aundria Rud, MD   100 mg at 01/15/17 2120    Lab Results:  No results found for this or any previous visit (from the past 48 hour(s)).  Blood Alcohol level:  Lab Results  Component Value Date   ETH <5 01/11/2017   ETH <5 01/10/2017    Metabolic Disorder Labs: No results found for: HGBA1C, MPG No results found for: PROLACTIN No results found for: CHOL, TRIG, HDL, CHOLHDL, VLDL, LDLCALC  Physical Findings: AIMS: Facial and Oral Movements Muscles of Facial Expression: None, normal Lips and Perioral Area: None, normal Jaw: None, normal Tongue: None, normal,Extremity Movements Upper (arms, wrists, hands, fingers): None, normal Lower (legs, knees, ankles, toes): None, normal, Trunk Movements Neck, shoulders, hips: None, normal, Overall  Severity Severity of abnormal movements (highest score from questions above): None, normal Incapacitation due to abnormal movements: None, normal Patient's awareness of abnormal movements (rate only patient's report): No Awareness, Dental Status Current problems with teeth and/or dentures?: No Does patient usually wear dentures?: No  CIWA:    COWS:     Musculoskeletal: Strength & Muscle Tone: within normal limits Gait & Station: normal Patient leans: N/A  Psychiatric Specialty Exam: Physical Exam  Nursing note and vitals reviewed. Psychiatric: His affect is blunt. His speech is delayed. He is slowed, withdrawn and actively hallucinating. Thought content is paranoid and delusional. Cognition and memory are normal. He expresses impulsivity.    Review of Systems  Psychiatric/Behavioral: Positive for hallucinations.  All other systems reviewed and are negative.   Blood pressure 106/65, pulse (!) 58, temperature 98.1 F (36.7 C), temperature source Oral, resp. rate 16, height 6' (1.829 m), weight 86.2 kg (190 lb), SpO2 98 %.Body mass index is 25.77 kg/m.  General Appearance: Guarded  Eye Contact:  Good  Speech:  Normal Rate  Volume:  Normal  Mood:  Dysphoric  Affect:  Flat  Thought Process:  Coherent  Orientation:  Full (Time, Place, and Person)  Thought Content:  deies  Suicidal Thoughts:  No  Homicidal Thoughts:  No  Judgement:  Poor  Insight:  Fair  Psychomotor Activity:  Normal  AIMS (if indicated):     Assets:  Others:  He is compliant on his medications  ADL's:  Intact  Cognition:  Not assessed  Sleep:  Number of Hours: 7.15   Treatment Plan Summary: Daily contact with patient to assess and evaluate symptoms and progress in treatment and Medication management   Mr. Mckeithan is 26 year old gentleman with a history of schizoaffective disorder presenting to the ER on the 31st after Benadryl overdose and hyponatremia. Endorses that he does not remember how he reached the  emergency room after admission.  At present he has a flat affect, apart from being guarded I cannot elicit any other acute psychopathology.  1. Suicidal ideation. The patient is able to contract for safety in the hospital.  2. Mood and psychosis. He was continued on Haldol, Cogentin and Depakote for psychosis and mood stabilization. I will switch Haldol to 10 mg twice daily and discuss Haldol decanoate injection with his guradian.  3. Hyponatremia. Resolved.  4. Diabetes. The patient refuses insulin. We continued metformin.  5. Dyslipidemia. Continue atorvastatin.   6. Constipation. Continue bisacodyl.   7. Insomnia. Continue trazodone.  8. HTN. Continue lisinopril.  9. Smoking cessation. Nicotine patch is available.   10. Metabolic syndrome monitoring. Lipid panel, TSH and HgbA1C are pending.  11. EKG. Sinus rhythm, QTc 456.  12. Social. The patient is an incompetent adult. DSS in [facility] is his guardian.   13. Disposition. He will be discharged to his group home. Follow up with ACT team.  Kristine Linea, MD 01/16/2017, 11:26 AM

## 2017-01-16 NOTE — Plan of Care (Signed)
Problem: Activity: Goal: Sleeping patterns will improve Outcome: Progressing Patient slept for Estimated Hours of 7.15; Precautionary checks every 15 minutes for safety maintained, room free of safety hazards, patient sustains no injury or falls during this shift.    

## 2017-01-17 NOTE — [facility] Group Notes (Signed)
[facility] Group Notes:  (Nursing/MHT/Case Management/Adjunct)  Date:  01/17/2017  Time:  9:12 PM  Type of Therapy:  Group Therapy  Participation Level:  Did Not Attend  Participation Quality:Summary of Progress/Problems:  Mayra NeerJackie L Tanee Henery 01/17/2017, 9:12 PM

## 2017-01-17 NOTE — [facility] Group Notes (Signed)
[facility] LCSW Group Therapy Note  Date/Time  September 6th,2018 9:30- to 10:15  Type of Therapy/Topic:  Group Therapy:  Balance in Life  Participation Level: Patient  was invited to attend therapuetic group and chose not to attend  Pritika Alvarez LCSW 

## 2017-01-17 NOTE — Plan of Care (Signed)
Problem: Activity: Goal: Interest or engagement in leisure activities will improve Outcome: Not Progressing Patient does not engage in any activities. Goal: Imbalance in normal sleep/wake cycle will improve Outcome: Not Progressing Noted increase in the number of hours that patient sleeps during the day.  Problem: Education: Goal: Knowledge of the prescribed therapeutic regimen will improve Outcome: Not Progressing Patient is not knowledgeable of the prescribed therapeutic regimen.  Problem: Coping: Goal: Ability to cope will improve Outcome: Not Progressing Patient is not coping with his current situation and condition.Will not engage in daily activities of living.

## 2017-01-17 NOTE — Progress Notes (Addendum)
[facility] MD Progress Note  01/17/2017 8:16 AM John Copeland  MRN:  [id number] Subjective:   01/13/17 - Patient reports that he had a good night and would like to go home. On looking up Care Everywhere, I noticed that he has had multiple ED visits at [facility] in August. The notes from these ED visits detail him his being homeless, having relocated from [place] a month ago and being in different group homes with no corroboration available. They also detailed him as having requested for his medications, mainly Depakote and trazodone from [facility]. They also detailed that his legal guardian is Sharl Ma in [place]. The numbers to reach them [phone] and [phone]. When I asked patient about these details, he says he does not recall being near [place] initially. Later he confirms that it is possible that he was near [place]. Endorses he may have been in a shelter for some period of time before moving into his current place of stay which is [facility]. His agreeable to fluid restriction and asks if he can go home. Has been noticed to be quiet and isolative in his room with no interactions with other patients. Has been taking his medications.  01/14/17 - endorses that he had a good night is doing well. Has not been observed to be drinking water from the toilet. His agreeable to adhering to his fluid restriction regimen. Will repeat serum sodium tomorrow.  01/15/2017. John Copeland seems confused this morning. He refused to meet with treatment team. He kept his head under the covers when I tried to talk to him. He is "fine". He came to the hospital because "he did not feel right". He claims good complaince with medications but is unable to name any. He does not know his group home, how long he has been there, or how big it is. He has no somatic complaints and is ready to go home. Reports good sleep and appetite. SW spoke with the group home. The patient does not like it there and has been calling  911 repeatedly to get out. His ACT team reportedly working on new placement but the patient is alowed to return to his group home for now. Unable to reach his guardian, Sharl Ma, as of yet.   01/16/2017. John Copeland is in bed today with his head covered. He initially tells me that he is "OK" but does not answer any other questions. This is in contrast to our interaction yesterday when he insisted on going home and was more engaging. Per nursing, he only gets out of bed for meals and does not come to medication room without much encouragement. He was likely noncompliant with treatment prior to admission as his VPA level on admission was only 21. On the other hand, hyponatremia could have been caused by Depakote. He refused labs yesterday and today to recheck his sodium. This is important as he was restarted on 1000 mg twice daily of Depakote.  01/17/2017. John Copeland refuses to get out of bed or uncover his face. He hardly answers any of my questions but get upset when I talk about group participation or injections of Haldol. He denies any problems but was explained that he needs to follow our program to get discharged. We have not had communication with his guardian as of yet. He continues to refuse labs. They are necessary as he is back on Depakote and may experience hyponatremia.   Per nursing: Patient did not get out of bed for medicine even with  much encouragement.Stayed in bed with head covered.Visible in the milieu this evening.Stated that he feels better.Denies suicidal or homicidal ideations and AV hallucinations.Patient came to staff & asked for hygiene products.Compliant with medications.Did not attend groups.Appetite and energy level good.Support & encouragement given.  Principal Problem: Schizoaffective disorder, bipolar type (HCC) Diagnosis:   Patient Active Problem List   Diagnosis Date Noted  . Polydipsia [R63.1] 01/12/2017  . Diphenhydramine overdose of undetermined intent [T45.0X4A]  01/10/2017  . Tobacco use disorder [F17.200] 10/07/2016  . Diabetes (HCC) [E11.9] 10/07/2016  . HTN (hypertension) [I10] 10/07/2016  . Dyslipidemia [E78.5] 10/07/2016  . Schizoaffective disorder, bipolar type (HCC) [F25.0] 11/30/2015   Total Time spent with patient: 30 minutes  Past Psychiatric History:  Endorses that the last time he had a normal life was when he was 26 years old. States he does not remember how long he's been on psychiatry medications. Dr. Toni Amend' note states a diagnosis of schizophrenia disorder, multiple hospitalizations and not being happy with the group home situation. Most recent medications of Depakote and Haldol  Past Medical History:  Past Medical History:  Diagnosis Date  . Anxiety   . Depression   . Diabetes mellitus without complication (HCC)   . Schizophrenia (HCC)    History reviewed. No pertinent surgical history. Family History: History reviewed. No pertinent family history. Family Psychiatric  History: Social History:  History  Alcohol Use No     History  Drug Use No    Social History   Social History  . Marital status: Single    Spouse name: N/A  . Number of children: N/A  . Years of education: N/A   Social History Main Topics  . Smoking status: Current Every Day Smoker    Packs/day: 0.50    Types: Cigarettes  . Smokeless tobacco: Never Used  . Alcohol use No  . Drug use: No  . Sexual activity: Not Asked   Other Topics Concern  . None   Social History Narrative  . None   Additional Social History:      Endorses that he is grown up all over [place]. States mom died when he was 26 years old and that he lost a brother too. Says he keeps in touch with his other family members but is not close with any of them. Is on SSI, says he does not like his assisted living facility but it's okay. Says the last time he worked was at the age of 26 and that was at [employer]. He has not worked after this. Education - ninth grade                     Sleep: Good  Appetite:  Good  Current Medications: Current Facility-Administered Medications  Medication Dose Route Frequency Provider Last Rate Last Dose  . acetaminophen (TYLENOL) tablet 650 mg  650 mg Oral Q6H PRN Aundria Rud, MD      . albuterol (PROVENTIL) (2.5 MG/3ML) 0.083% nebulizer solution 2.5 mg  2.5 mg Nebulization Q6H PRN Aundria Rud, MD      . alum & mag hydroxide-simeth (MAALOX/MYLANTA) 200-200-20 MG/5ML suspension 30 mL  30 mL Oral Q4H PRN Aundria Rud, MD      . atorvastatin (LIPITOR) tablet 10 mg  10 mg Oral q1800 Aundria Rud, MD   10 mg at 01/16/17 1645  . benztropine (COGENTIN) tablet 1 mg  1 mg Oral Daily Aundria Rud, MD   1 mg at 01/16/17 0948  . bisacodyl (DULCOLAX)  EC tablet 5 mg  5 mg Oral Daily PRN Aundria Rud, MD      . diphenhydrAMINE (BENADRYL) capsule 50 mg  50 mg Oral QHS Aundria Rud, MD   50 mg at 01/16/17 2130  . divalproex (DEPAKOTE ER) 24 hr tablet 1,000 mg  1,000 mg Oral BID Aundria Rud, MD   1,000 mg at 01/16/17 1643  . haloperidol (HALDOL) tablet 10 mg  10 mg Oral BID Preciosa Bundrick B, MD   10 mg at 01/16/17 1643  . ipratropium (ATROVENT) nebulizer solution 0.5 mg  0.5 mg Nebulization Q6H PRN Aundria Rud, MD      . lisinopril (PRINIVIL,ZESTRIL) tablet 10 mg  10 mg Oral Daily Aundria Rud, MD   10 mg at 01/16/17 0949  . magnesium hydroxide (MILK OF MAGNESIA) suspension 30 mL  30 mL Oral Daily PRN Aundria Rud, MD      . metFORMIN (GLUCOPHAGE) tablet 1,000 mg  1,000 mg Oral BID WC Aundria Rud, MD   1,000 mg at 01/16/17 1642  . multivitamin with minerals tablet 1 tablet  1 tablet Oral Daily Aundria Rud, MD   1 tablet at 01/16/17 0948  . traZODone (DESYREL) tablet 100 mg  100 mg Oral QHS Aundria Rud, MD   100 mg at 01/16/17 2130    Lab Results:  No results found for this or any previous visit (from the past 48 hour(s)).  Blood  Alcohol level:  Lab Results  Component Value Date   ETH <5 01/11/2017   ETH <5 01/10/2017    Metabolic Disorder Labs: No results found for: HGBA1C, MPG No results found for: PROLACTIN No results found for: CHOL, TRIG, HDL, CHOLHDL, VLDL, LDLCALC  Physical Findings: AIMS: Facial and Oral Movements Muscles of Facial Expression: None, normal Lips and Perioral Area: None, normal Jaw: None, normal Tongue: None, normal,Extremity Movements Upper (arms, wrists, hands, fingers): None, normal Lower (legs, knees, ankles, toes): None, normal, Trunk Movements Neck, shoulders, hips: None, normal, Overall Severity Severity of abnormal movements (highest score from questions above): None, normal Incapacitation due to abnormal movements: None, normal Patient's awareness of abnormal movements (rate only patient's report): No Awareness, Dental Status Current problems with teeth and/or dentures?: No Does patient usually wear dentures?: No  CIWA:    COWS:     Musculoskeletal: Strength & Muscle Tone: within normal limits Gait & Station: normal Patient leans: N/A  Psychiatric Specialty Exam: Physical Exam  Nursing note and vitals reviewed. Psychiatric: His affect is blunt. His speech is delayed. He is slowed, withdrawn and actively hallucinating. Thought content is paranoid and delusional. Cognition and memory are normal. He expresses impulsivity.    Review of Systems  Psychiatric/Behavioral: Positive for hallucinations.  All other systems reviewed and are negative.   Blood pressure 106/65, pulse (!) 58, temperature 98.1 F (36.7 C), temperature source Oral, resp. rate 16, height 6' (1.829 m), weight 86.2 kg (190 lb), SpO2 98 %.Body mass index is 25.77 kg/m.  General Appearance: Guarded  Eye Contact:  Good  Speech:  Normal Rate  Volume:  Normal  Mood:  Dysphoric  Affect:  Flat  Thought Process:  Coherent  Orientation:  Full (Time, Place, and Person)  Thought Content:  deies  Suicidal  Thoughts:  No  Homicidal Thoughts:  No  Judgement:  Poor  Insight:  Fair  Psychomotor Activity:  Normal  AIMS (if indicated):     Assets:  Others:  He is compliant on his medications  ADL's:  Intact  Cognition:  Not  assessed  Sleep:  Number of Hours: 8.15   Treatment Plan Summary: Daily contact with patient to assess and evaluate symptoms and progress in treatment and Medication management   [name] is 26 year old gentleman with a history of schizoaffective disorder presenting to the ER on the 31st after Benadryl overdose and hyponatremia. Endorses that he does not remember how he reached the emergency room after admission.  At present he has a flat affect, apart from being guarded I cannot elicit any other acute psychopathology.  1. Suicidal ideation. The patient is able to contract for safety in the hospital.  2. Mood and psychosis. He was continued on Haldol, Cogentin and Depakote for psychosis and mood stabilization. I will switch Haldol to 10 mg twice daily and discuss Haldol decanoate injection with his guradian.  3. Hyponatremia. Resolved.  4. Diabetes. The patient refuses insulin. We continued metformin.  5. Dyslipidemia. Continue atorvastatin.   6. Constipation. Continue bisacodyl.   7. Insomnia. Continue trazodone.  8. HTN. Continue lisinopril.  9. Smoking cessation. Nicotine patch is available.   10. Metabolic syndrome monitoring. Lipid panel, TSH and HgbA1C are pending.  11. EKG. Sinus rhythm, QTc 456.  12. Social. The patient is an incompetent adult. DSS [place] his [place].   13. Disposition. He will be discharged to his group home. Follow up with ACT team.  Kristine LineaJolanta Nyoka Alcoser, MD 01/17/2017, 8:16 AM

## 2017-01-17 NOTE — [facility] Group Notes (Signed)
Goals Group Date/Time:September 6th,2018 9-9:30 Type of Therapy and Topic: Group Therapy: Goals Group: SMART Goals   Participation Level: Patient did not participate patient was remined and was invited to attend group by nurse tech.   John Copeland   

## 2017-01-17 NOTE — [facility] Group Notes (Signed)
[facility] Group Notes:  (Nursing/MHT/Case Management/Adjunct)  Date:  01/17/2017  Time:  1:49 PM  Type of Therapy:  Psychoeducational Skills  Participation Level:  Did Not Attend   John Copeland  [name] 01/17/2017, 1:49 PM

## 2017-01-18 NOTE — Progress Notes (Signed)
Patient ID: John Copeland, male   DOB: 25-Jun-1990, 26 y.o.   MRN: [id number] Disheveled, encourage to take a shower, denied pain, denied SI/HI/AVH, less isolative to room, has been coming out for snacks and medications, has not been attending group, responds to questions with short answers; flat mood and affect.

## 2017-01-18 NOTE — [facility] Group Notes (Signed)
[facility] Group Notes:  (Nursing/MHT/Case Management/Adjunct)  Date:  01/18/2017  Time:  9:44 PM  Type of Therapy:  Evening Wrap-up Group  Participation Level:  Minimal  Participation Quality:  Appropriate and Attentive  Affect:  Appropriate  Cognitive:  Alert and Appropriate  Insight:  Improving  Engagement in Group:  Developing/Improving and Engaged  Modes of Intervention:  Discussion  Summary of Progress/Problems:  Tomasita MorrowChelsea Nanta Maurissa Ambrose 01/18/2017, 9:44 PM

## 2017-01-18 NOTE — Plan of Care (Signed)
Problem: Activity: Goal: Sleeping patterns will improve Outcome: Progressing Patient slept for Estimated Hours of 6.45; Precautionary checks every 15 minutes for safety maintained, room free of safety hazards, patient sustains no injury or falls during this shift. Monitor for fluid intake, room closer to the nurses station for close observation, continues to refuse am Labs.

## 2017-01-18 NOTE — Plan of Care (Addendum)
Problem: Coping: Goal: Ability to cope will improve Outcome: Progressing Patient visible out in the milieu and sidelining in day area for a brief time. Patient responsive to staff.  Goal: Ability to verbalize feelings will improve Outcome: Not Progressing Patient very guarded on assessment. Does not verbalize feelings and emotions easily.   Problem: Coping: Goal: Ability to cope will improve Outcome: Progressing Per report from previous nurse patient sleeping less during the day. He declined to verbalize healthy activities to cope with hospitalization.

## 2017-01-18 NOTE — Tx Team (Signed)
Interdisciplinary Treatment and Diagnostic Plan Update  01/18/2017 Time of Session: 11am Terik Haughey MRN: [id number]  Principal Diagnosis: Schizoaffective disorder, bipolar type [facility])  Secondary Diagnoses: Principal Problem:   Schizoaffective disorder, bipolar type ([place]) Active Problems:   Tobacco use disorder   Diabetes ([place])   HTN (hypertension)   Dyslipidemia   Diphenhydramine overdose of undetermined intent   Polydipsia   Current Medications:  Current Facility-Administered Medications  Medication Dose Route Frequency Provider Last Rate Last Dose  . acetaminophen (TYLENOL) tablet 650 mg  650 mg Oral Q6H PRN Ramond Dial, MD      . albuterol (PROVENTIL) (2.5 MG/3ML) 0.083% nebulizer solution 2.5 mg  2.5 mg Nebulization Q6H PRN Ramond Dial, MD      . alum & mag hydroxide-simeth (MAALOX/MYLANTA) 200-200-20 MG/5ML suspension 30 mL  30 mL Oral Q4H PRN Ramond Dial, MD      . atorvastatin (LIPITOR) tablet 10 mg  10 mg Oral q1800 Ramond Dial, MD   10 mg at 01/17/17 1702  . benztropine (COGENTIN) tablet 1 mg  1 mg Oral Daily Ramond Dial, MD   1 mg at 01/18/17 0815  . bisacodyl (DULCOLAX) EC tablet 5 mg  5 mg Oral Daily PRN Ramond Dial, MD      . diphenhydrAMINE (BENADRYL) capsule 50 mg  50 mg Oral QHS Ramond Dial, MD   50 mg at 01/17/17 2116  . divalproex (DEPAKOTE ER) 24 hr tablet 1,000 mg  1,000 mg Oral BID Ramond Dial, MD   1,000 mg at 01/18/17 0814  . haloperidol (HALDOL) tablet 10 mg  10 mg Oral BID Pucilowska, Jolanta B, MD   10 mg at 01/18/17 0815  . ipratropium (ATROVENT) nebulizer solution 0.5 mg  0.5 mg Nebulization Q6H PRN Ramond Dial, MD      . lisinopril (PRINIVIL,ZESTRIL) tablet 10 mg  10 mg Oral Daily Ramond Dial, MD   10 mg at 01/18/17 0823  . magnesium hydroxide (MILK OF MAGNESIA) suspension 30 mL  30 mL Oral Daily PRN Ramond Dial, MD      . metFORMIN (GLUCOPHAGE) tablet 1,000 mg  1,000 mg  Oral BID WC Ramond Dial, MD   1,000 mg at 01/18/17 0815  . multivitamin with minerals tablet 1 tablet  1 tablet Oral Daily Ramond Dial, MD   1 tablet at 01/18/17 0815  . traZODone (DESYREL) tablet 100 mg  100 mg Oral QHS Ramond Dial, MD   100 mg at 01/17/17 2116   PTA Medications: Prescriptions Prior to Admission  Medication Sig Dispense Refill Last Dose  . diphenhydrAMINE (BENADRYL) 50 MG capsule Take 1 capsule (50 mg total) by mouth at bedtime. 30 capsule 0 Past Month at Unknown time  . divalproex (DEPAKOTE ER) 500 MG 24 hr tablet Take 2 tablets (1,000 mg total) by mouth 2 (two) times daily. 120 tablet 0 Past Month at Unknown time    Patient Stressors: Educational concerns Occupational concerns  Patient Strengths: Other: Patient has congnitive delayed function  Treatment Modalities: Medication Management, Group therapy, Case management,  1 to 1 session with clinician, Psychoeducation, Recreational therapy.   Physician Treatment Plan for Primary Diagnosis: Schizoaffective disorder, bipolar type ([place]) Long Term Goal(s): Improvement in symptoms so as ready for discharge Improvement in symptoms so as ready for discharge   Short Term Goals: psychoeducation on importance of medications. Compliance with prescribed medications will improve  Medication Management: Evaluate patient's response, side effects, and tolerance of medication regimen.  Therapeutic Interventions: 1 to 1 sessions, Unit Group sessions  and Medication administration.  Evaluation of Outcomes: Not Progressing  Physician Treatment Plan for Secondary Diagnosis: Principal Problem:   Schizoaffective disorder, bipolar type ([place]) Active Problems:   Tobacco use disorder   Diabetes ([place])   HTN (hypertension)   Dyslipidemia   Diphenhydramine overdose of undetermined intent   Polydipsia  Long Term Goal(s): Improvement in symptoms so as ready for discharge Improvement in symptoms so as ready for  discharge   Short Term Goals: psychoeducation on importance of medications. Compliance with prescribed medications will improve     Medication Management: Evaluate patient's response, side effects, and tolerance of medication regimen.  Therapeutic Interventions: 1 to 1 sessions, Unit Group sessions and Medication administration.  Evaluation of Outcomes: Not Progressing   RN Treatment Plan for Primary Diagnosis: Schizoaffective disorder, bipolar type ([place]) Long Term Goal(s): Knowledge of disease and therapeutic regimen to maintain health will improve  Short Term Goals: Ability to remain free from injury will improve  Medication Management: RN will administer medications as ordered by provider, will assess and evaluate patient's response and provide education to patient for prescribed medication. RN will report any adverse and/or side effects to prescribing provider.  Therapeutic Interventions: 1 on 1 counseling sessions, Psychoeducation, Medication administration, Evaluate responses to treatment, Monitor vital signs and CBGs as ordered, Perform/monitor CIWA, COWS, AIMS and Fall Risk screenings as ordered, Perform wound care treatments as ordered.  Evaluation of Outcomes: Not Met   LCSW Treatment Plan for Primary Diagnosis: Schizoaffective disorder, bipolar type ([place]) Long Term Goal(s): Safe transition to appropriate next level of care at discharge, Engage patient in therapeutic group addressing interpersonal concerns.  Short Term Goals: Engage patient in aftercare planning with referrals and resources  Therapeutic Interventions: Assess for all discharge needs, 1 to 1 time with Social worker, Explore available resources and support systems, Assess for adequacy in community support network, Educate family and significant other(s) on suicide prevention, Complete Psychosocial Assessment, Interpersonal group therapy.  Evaluation of Outcomes: Not Progressing   Progress in  Treatment: Attending groups: No. Participating in groups: No. Taking medication as prescribed: Yes. Toleration medication: Yes. Family/Significant other contact made: Yes, individual(s) contacted:  Guardian   Patient understands diagnosis: Yes. Discussing patient identified problems/goals with staff: No. Medical problems stabilized or resolved: Yes. Denies suicidal/homicidal ideation: Yes. Issues/concerns per patient self-inventory: Yes. Other:   New problem(s) identified: No, Describe:  none reported  New Short Term/Long Term Goal(s):  Discharge Plan or Barriers:  Assessing for appropriate referrals and after care.  Currently active with [facility]  Reason for Continuation of Hospitalization: Depression Other; describe Paranoia, hallucinations   9/7: patient continues to isolate, not engage with staff and keep his bedding/covers over his head.    His head is not covered completely and I could see his left eye. He tells me that he feels better, denies any symptoms of depression, anxiety, or psychosis. He is not suicidal. His guardian suggested that he might be depressed but the patient denies. He feels ready for discharge but he has not been participating in programming or interacting with staff or peers. He promised to try harder. There are no somatic complains. He stillrefuses labs and will not be discharge unless we check his VPA and Na level.   Estimated Length of Stay:  3-5 days  Attendees: Patient:   01/18/2017 3:09 PM  Physician: Dr. Eual Fines, MD 01/18/2017 3:09 PM  Nursing:   01/18/2017 3:09 PM  RN Care Manager: 01/18/2017 3:09 PM  Social Worker: Jarrett Soho  Curt Bears, LCSW 01/18/2017 3:09 PM  Recreational Therapist:  01/18/2017 3:09 PM  Other:  01/18/2017 3:09 PM  Other:  01/18/2017 3:09 PM  Other: 01/18/2017 3:09 PM    Scribe for Treatment Team: Lilly Cove, LCSW 01/18/2017 3:09 PM

## 2017-01-18 NOTE — [facility] Group Notes (Signed)
[facility] Group Notes:  (Nursing/MHT/Case Management/Adjunct)  Date:  01/18/2017  Time:  10:02 AM  Type of Therapy:  Psychoeducational Skills  Participation Level:  Did Not Attend  Lynelle SmokeCara Travis [facility]oni 01/18/2017, 10:02 AM

## 2017-01-18 NOTE — [facility] Group Notes (Signed)
3:03 PM   Group Therapy with LCSW  (afternoon group)   Did not attend.  Anakin Varkey LCSW, MSW Clinical Social Work: System Wide Float Coverage for :  [phone]  

## 2017-01-18 NOTE — Plan of Care (Signed)
Problem: Activity: Goal: Imbalance in normal sleep/wake cycle will improve Outcome: Not Progressing Received Ivin BootyJoshua this am after the shift changed, he was compliant with his medications and OOB for his meals and snacks. He continues to sleep all day and or in the bed, although he stated sleeping well and his energy level is normal.  Reviewing his self inventory worksheet with him, he stated his goal is to get out of here and go home. He was strongly encouraged to attend at least 2 group therapy sessions per day.

## 2017-01-18 NOTE — Progress Notes (Signed)
[facility] MD Progress Note  01/18/2017 8:38 AM John Copeland  MRN:  [id number] Subjective:   01/13/17 - Patient reports that he had a good night and would like to go home. On looking up Care Everywhere, I noticed that he has had multiple ED visits at [facility] in August. The notes from these ED visits detail him his being homeless, having relocated from [place] a month ago and being in different group homes with no corroboration available. They also detailed him as having requested for his medications, mainly Depakote and trazodone from [facility]. They also detailed that his legal guardian [place] in [place]. The numbers to reach them [phone] and [phone]. When I asked patient about these details, he says he does not recall being near [facility] initially. Later he confirms that it is possible that he was near [facility]. Endorses he may have been in a shelter for some period of time before moving into his current place of stay which is [facility] home. His agreeable to fluid restriction and asks if he can go home. Has been noticed to be quiet and isolative in his room with no interactions with other patients. Has been taking his medications.  01/14/17 - endorses that he had a good night is doing well. Has not been observed to be drinking water from the toilet. His agreeable to adhering to his fluid restriction regimen. Will repeat serum sodium tomorrow.  01/15/2017. John Copeland seems confused this morning. He refused to meet with treatment team. He kept his head under the covers when I tried to talk to him. He is "fine". He came to the hospital because "he did not feel right". He claims good complaince with medications but is unable to name any. He does not know his group home, how long he has been there, or how big it is. He has no somatic complaints and is ready to go home. Reports good sleep and appetite. SW spoke with the group home. The patient does not like it there and has been calling  [phone] repeatedly to get out. His ACT team reportedly working on new placement but the patient is alowed to return to his group home for now. Unable to reach his guardian, John Copeland, as of yet.   01/16/2017. John Copeland is in bed today with his head covered. He initially tells me that he is "OK" but does not answer any other questions. This is in contrast to our interaction yesterday when he insisted on going home and was more engaging. Per nursing, he only gets out of bed for meals and does not come to medication room without much encouragement. He was likely noncompliant with treatment prior to admission as his VPA level on admission was only 21. On the other hand, hyponatremia could have been caused by Depakote. He refused labs yesterday and today to recheck his sodium. This is important as he was restarted on 1000 mg twice daily of Depakote.  01/17/2017. John Copeland refuses to get out of bed or uncover his face. He hardly answers any of my questions but get upset when I talk about group participation or injections of Haldol. He denies any problems but was explained that he needs to follow our program to get discharged. We have not had communication with his guardian as of yet. He continues to refuse labs. They are necessary as he is back on Depakote and may experience hyponatremia.   01/18/2017. John Copeland talks to me this morning more. His head is not  covered completely and I could see his left eye. He tells me that he feels better, denies any symptoms of depression, anxiety, or psychosis. He is not suicidal. His guardian suggested that he might be depressed but the patient denies. He feels ready for discharge but he has not been participating in programming or interacting with staff or peers. He promised to try harder. There are no somatic complains. He stillrefuses labs and will not be discharge unless we check his VPA and Na level.   Per nursing: Disheveled, encourage to take a shower, denied pain, denied  SI/HI/AVH, less isolative to room, has been coming out for snacks and medications, has not been attending group, responds to questions with short answers; flat mood and affect.  Principal Problem: Schizoaffective disorder, bipolar type (HCC) Diagnosis:   Patient Active Problem List   Diagnosis Date Noted  . Polydipsia [R63.1] 01/12/2017  . Diphenhydramine overdose of undetermined intent [T45.0X4A] 01/10/2017  . Tobacco use disorder [F17.200] 10/07/2016  . Diabetes (HCC) [E11.9] 10/07/2016  . HTN (hypertension) [I10] 10/07/2016  . Dyslipidemia [E78.5] 10/07/2016  . Schizoaffective disorder, bipolar type (HCC) [F25.0] 11/30/2015   Total Time spent with patient: 30 minutes  Past Psychiatric History:  Endorses that the last time he had a normal life was when he was 26 years old. States he does not remember how long he's been on psychiatry medications. Dr. Toni Copeland' note states a diagnosis of schizophrenia disorder, multiple hospitalizations and not being happy with the group home situation. Most recent medications of Depakote and Haldol  Past Medical History:  Past Medical History:  Diagnosis Date  . Anxiety   . Depression   . Diabetes mellitus without complication (HCC)   . Schizophrenia (HCC)    History reviewed. No pertinent surgical history. Family History: History reviewed. No pertinent family history. Family Psychiatric  History: Social History:  History  Alcohol Use No     History  Drug Use No    Social History   Social History  . Marital status: Single    Spouse name: N/A  . Number of children: N/A  . Years of education: N/A   Social History Main Topics  . Smoking status: Current Every Day Smoker    Packs/day: 0.50    Types: Cigarettes  . Smokeless tobacco: Never Used  . Alcohol use No  . Drug use: No  . Sexual activity: Not Asked   Other Topics Concern  . None   Social History Narrative  . None   Additional Social History:      Endorses that he is grown  up all over [place]. States mom died when he was 42 years old and that he lost a brother too. Says he keeps in touch with his other family members but is not close with any of them. Is on SSI, says he does not like his assisted living facility but it's okay. Says the last time he worked was at the age of 9 and that was at [employer]. He has not worked after this. Education - ninth grade                    Sleep: Good  Appetite:  Good  Current Medications: Current Facility-Administered Medications  Medication Dose Route Frequency Provider Last Rate Last Dose  . acetaminophen (TYLENOL) tablet 650 mg  650 mg Oral Q6H PRN Aundria Rud, MD      . albuterol (PROVENTIL) (2.5 MG/3ML) 0.083% nebulizer solution 2.5 mg  2.5 mg  Nebulization Q6H PRN Aundria Rud, MD      . alum & mag hydroxide-simeth (MAALOX/MYLANTA) 200-200-20 MG/5ML suspension 30 mL  30 mL Oral Q4H PRN Aundria Rud, MD      . atorvastatin (LIPITOR) tablet 10 mg  10 mg Oral q1800 Aundria Rud, MD   10 mg at 01/17/17 1702  . benztropine (COGENTIN) tablet 1 mg  1 mg Oral Daily Aundria Rud, MD   1 mg at 01/18/17 0815  . bisacodyl (DULCOLAX) EC tablet 5 mg  5 mg Oral Daily PRN Aundria Rud, MD      . diphenhydrAMINE (BENADRYL) capsule 50 mg  50 mg Oral QHS Aundria Rud, MD   50 mg at 01/17/17 2116  . divalproex (DEPAKOTE ER) 24 hr tablet 1,000 mg  1,000 mg Oral BID Aundria Rud, MD   1,000 mg at 01/18/17 0814  . haloperidol (HALDOL) tablet 10 mg  10 mg Oral BID Corinthia Helmers B, MD   10 mg at 01/18/17 0815  . ipratropium (ATROVENT) nebulizer solution 0.5 mg  0.5 mg Nebulization Q6H PRN Aundria Rud, MD      . lisinopril (PRINIVIL,ZESTRIL) tablet 10 mg  10 mg Oral Daily Aundria Rud, MD   10 mg at 01/18/17 0823  . magnesium hydroxide (MILK OF MAGNESIA) suspension 30 mL  30 mL Oral Daily PRN Aundria Rud, MD      . metFORMIN (GLUCOPHAGE) tablet 1,000 mg   1,000 mg Oral BID WC Aundria Rud, MD   1,000 mg at 01/18/17 0815  . multivitamin with minerals tablet 1 tablet  1 tablet Oral Daily Aundria Rud, MD   1 tablet at 01/18/17 0815  . traZODone (DESYREL) tablet 100 mg  100 mg Oral QHS Aundria Rud, MD   100 mg at 01/17/17 2116    Lab Results:  No results found for this or any previous visit (from the past 48 hour(s)).  Blood Alcohol level:  Lab Results  Component Value Date   ETH <5 01/11/2017   ETH <5 01/10/2017    Metabolic Disorder Labs: No results found for: HGBA1C, MPG No results found for: PROLACTIN No results found for: CHOL, TRIG, HDL, CHOLHDL, VLDL, LDLCALC  Physical Findings: AIMS: Facial and Oral Movements Muscles of Facial Expression: None, normal Lips and Perioral Area: None, normal Jaw: None, normal Tongue: None, normal,Extremity Movements Upper (arms, wrists, hands, fingers): None, normal Lower (legs, knees, ankles, toes): None, normal, Trunk Movements Neck, shoulders, hips: None, normal, Overall Severity Severity of abnormal movements (highest score from questions above): None, normal Incapacitation due to abnormal movements: None, normal Patient's awareness of abnormal movements (rate only patient's report): No Awareness, Dental Status Current problems with teeth and/or dentures?: No Does patient usually wear dentures?: No  CIWA:    COWS:     Musculoskeletal: Strength & Muscle Tone: within normal limits Gait & Station: normal Patient leans: N/A  Psychiatric Specialty Exam: Physical Exam  Nursing note and vitals reviewed. Psychiatric: His affect is blunt. His speech is delayed. He is slowed, withdrawn and actively hallucinating. Thought content is paranoid and delusional. Cognition and memory are normal. He expresses impulsivity.    Review of Systems  Psychiatric/Behavioral: Positive for hallucinations.  All other systems reviewed and are negative.   Blood pressure 112/72, pulse (!)  58, temperature 98.1 F (36.7 C), temperature source Oral, resp. rate 16, height 6' (1.829 m), weight 86.2 kg (190 lb), SpO2 98 %.Body mass index is 25.77 kg/m.  General Appearance: Guarded  Eye Contact:  Good  Speech:  Normal Rate  Volume:  Normal  Mood:  Dysphoric  Affect:  Flat  Thought Process:  Coherent  Orientation:  Full (Time, Place, and Person)  Thought Content:  deies  Suicidal Thoughts:  No  Homicidal Thoughts:  No  Judgement:  Poor  Insight:  Fair  Psychomotor Activity:  Normal  AIMS (if indicated):     Assets:  Others:  He is compliant on his medications  ADL's:  Intact  Cognition:  Not assessed  Sleep:  Number of Hours: 6.45   Treatment Plan Summary: Daily contact with patient to assess and evaluate symptoms and progress in treatment and Medication management   Mr. Dietrick is 26 year old gentleman with a history of schizoaffective disorder presenting to the ER on the 31st after Benadryl overdose and hyponatremia. Endorses that he does not remember how he reached the emergency room after admission.  At present he has a flat affect, apart from being guarded I cannot elicit any other acute psychopathology.  1. Suicidal ideation. The patient is able to contract for safety in the hospital.  2. Mood and psychosis. He was continued on Haldol, Cogentin and Depakote for psychosis and mood stabilization. I will switch Haldol to 10 mg twice daily and discuss Haldol decanoate injection with his guradian.  3. Hyponatremia. Resolved.  4. Diabetes. The patient refuses insulin. We continued metformin.  5. Dyslipidemia. Continue atorvastatin.   6. Constipation. Continue bisacodyl.   7. Insomnia. Continue trazodone.  8. HTN. Continue lisinopril.  9. Smoking cessation. Nicotine patch is available.   10. Metabolic syndrome monitoring. Lipid panel, TSH and HgbA1C are pending.  11. EKG. Sinus rhythm, QTc 456.  12. Social. The patient is an incompetent adult. DSS in  [facility] is his guardian.   13. Disposition. He will be discharged to his group home. Follow up with ACT team.  Kristine Linea, MD 01/18/2017, 8:38 AM

## 2017-01-19 LAB — VALPROIC ACID LEVEL: Valproic Acid Lvl: 119 ug/mL — ABNORMAL HIGH (ref 50.0–100.0)

## 2017-01-19 LAB — COMPREHENSIVE METABOLIC PANEL
ALK PHOS: 38 U/L (ref 38–126)
ALT: 13 U/L — AB (ref 17–63)
ANION GAP: 9 (ref 5–15)
AST: 16 U/L (ref 15–41)
Albumin: 4.1 g/dL (ref 3.5–5.0)
BUN: 12 mg/dL (ref 6–20)
CALCIUM: 9.5 mg/dL (ref 8.9–10.3)
CHLORIDE: 99 mmol/L — AB (ref 101–111)
CO2: 28 mmol/L (ref 22–32)
CREATININE: 0.79 mg/dL (ref 0.61–1.24)
GFR calc Af Amer: >60 mL/min (ref >60)
GFR calc non Af Amer: >60 mL/min (ref >60)
GLUCOSE: 102 mg/dL — AB (ref 65–99)
Potassium: 4.5 mmol/L (ref 3.5–5.1)
SODIUM: 136 mmol/L (ref 135–145)
Total Bilirubin: 0.5 mg/dL (ref 0.3–1.2)
Total Protein: 7 g/dL (ref 6.5–8.1)

## 2017-01-19 LAB — AMMONIA

## 2017-01-19 MED ORDER — DIVALPROEX SODIUM ER 500 MG PO TB24
500.0000 mg | ORAL_TABLET | Freq: Every day | ORAL | Status: DC
  Administered 2017-01-20: 500 mg via ORAL
  Filled 2017-01-19: qty 1

## 2017-01-19 MED ORDER — DIVALPROEX SODIUM ER 500 MG PO TB24: 1000.0000 mg | ORAL_TABLET | Freq: Every day | ORAL | Status: DC

## 2017-01-19 MED ORDER — FLUVOXAMINE MALEATE 50 MG PO TABS
50.0000 mg | ORAL_TABLET | Freq: Every day | ORAL | Status: DC
  Administered 2017-01-19: 50 mg via ORAL
  Filled 2017-01-19: qty 1

## 2017-01-19 NOTE — Plan of Care (Signed)
Problem: Activity: Goal: Interest or engagement in leisure activities will improve Outcome: Not Progressing Patient remained in his room most of the day. No interaction with peers or staff seen Came out of his room for meals with encouragement.

## 2017-01-19 NOTE — Plan of Care (Signed)
Problem: Activity: Goal: Imbalance in normal sleep/wake cycle will improve Outcome: Not Progressing Per report patient continues to sleep late into the morning and nap during the day. He is compliant with HS sleep medication.   Problem: Education: Goal: Knowledge of the prescribed therapeutic regimen will improve No knowledge or comprehension of therapeutic regimen verbalized or demonstrated. [employer] education provided. Education needs reinforcement.   Problem: Coping: Goal: Ability to cope will improve Outcome: Not Progressing Patient isolative this shift.  Goal: Ability to verbalize feelings will improve Outcome: Not Progressing Patient very guarded with staff and was not observed socializing with any peers this shift. He remained isolated in his room most of the night.

## 2017-01-19 NOTE — Progress Notes (Signed)
[facility]  01/19/2017 7:38 AM John MuffJoshua Omar Copeland  MRN:  [id number] Subjective:   01/13/17 - Patient reports that he had a good night and would like to go home. On looking up Care Everywhere, I noticed that he has had multiple ED [facility] in [facility]. The notes from these ED visits detail him his being homeless, having relocated from [place] a month ago and being in different group homes with no corroboration available. They also detailed him as having requested for his medications, mainly Depakote and tra[facility]odone from [facility]. They also detailed that his legal guardian is John Cop[place] in [place]. The numbers to reach them [phone] and [phone]. When I asked patient about these details, he says he does not recall being near [facility] initially. Later he confirms that it is possible that he was near [facility]. Endorses he may have been in a shelter for some period of time before moving into his current place of stay which is [facility]. His agreeable to fluid restriction and asks if he can go home. Has been noticed to be quiet and isolative in his room with no interactions with other patients. Has been taking his medications.  01/14/17 - endorses that he had a good night is doing well. Has not been observed to be drinking water from the toilet. His agreeable to adhering to his fluid restriction regimen. Will repeat serum sodium tomorrow.  01/15/2017. [name] seems confused this morning. He refused to meet with treatment team. He kept his head under the covers when I tried to talk to him. He is "fine". He came to the hospital because "he did not feel right". He claims good complaince with medications but is unable to name any. He does not know his group home, how long he has been there, or how big it is. He has no somatic complaints and is ready to go home. Reports good sleep and appetite. SW spoke with the group home. The patient does not like it there and has been calling  [phone] repeatedly to get out. His ACT team reportedly working on new placement but the patient is alowed to return to his group home for now. Unable to reach his guardian, John Copeland, as of yet.   01/16/2017. [name] is in bed today with his head covered. He initially tells me that he is "OK" but does not answer any other questions. This is in contrast to our interaction yesterday when he insisted on going home and was more engaging. Per nursing, he only gets out of bed for meals and does not come to medication room without much encouragement. He was likely noncompliant with treatment prior to admission as his VPA level on admission was only 21. On the other hand, hyponatremia could have been caused by Depakote. He refused labs yesterday and today to recheck his sodium. This is important as he was restarted on 1000 mg twice daily of Depakote.  01/17/2017. [name] refuses to get out of bed or uncover his face. He hardly answers any of my questions but get upset when I talk about group participation or injections of Haldol. He denies any problems but was explained that he needs to follow our program to get discharged. We have not had communication with his guardian as of yet. He continues to refuse labs. They are necessary as he is back on Depakote and may experience hyponatremia.   01/18/2017. [name] talks to me this morning more. His head is not  covered completely and I could see his left eye. He tells me that he feels better, denies any symptoms of depression, anxiety, or psychosis. He is not suicidal. His guardian suggested that he might be depressed but the patient denies. He feels ready for discharge but he has not been participating in programming or interacting with staff or peers. He promised to try harder. There are no somatic complains. He stillrefuses labs and will not be discharge unless we check his VPA and Na level.   01/19/2017. Mr. [name] barely acknowledges my presence this morning. He is  "fine". I saw him out in the community in the afternoon yesterday. Even when out of his room, he does not interact with peers or staff. He denies suicidal ideation. He refuses labs because he is scared of needles. He was explained again that he will not be discharged before we obtain labs for VPA and sodium levels. His hygiene is rather poor. Very limited eye contact.  Per nursing: Per report from previous nurse patient sleeping less during the day. He declined to verbalized healthy activities to cope with hospitalization.   Principal Problem: Schizoaffective disorder, bipolar type (HCC) Diagnosis:   Patient Active Problem List   Diagnosis Date Noted  . Polydipsia [R63.1] 01/12/2017  . Diphenhydramine overdose of undetermined intent [T45.0X4A] 01/10/2017  . Tobacco use disorder [F17.200] 10/07/2016  . Diabetes (HCC) [E11.9] 10/07/2016  . HTN (hypertension) [I10] 10/07/2016  . Dyslipidemia [E78.5] 10/07/2016  . Schizoaffective disorder, bipolar type (HCC) [F25.0] 11/30/2015   Total Time spent with patient: 30 minutes  Past Psychiatric History:  Endorses that the last time he had a normal life was when he was 26 years old. States he does not remember how long he's been on psychiatry medications. Dr. Toni Amend' note states a diagnosis of schizophrenia disorder, multiple hospitalizations and not being happy with the group home situation. Most recent medications of Depakote and Haldol  Past Medical History:  Past Medical History:  Diagnosis Date  . Anxiety   . Depression   . Diabetes mellitus without complication (HCC)   . Schizophrenia (HCC)    History reviewed. No pertinent surgical history. Family History: History reviewed. No pertinent family history. Family Psychiatric  History: Social History:  History  Alcohol Use No     History  Drug Use No    Social History   Social History  . Marital status: Single    Spouse name: N/A  . Number of children: N/A  . Years of education:  N/A   Social History Main Topics  . Smoking status: Current Every Day Smoker    Packs/day: 0.50    Types: Cigarettes  . Smokeless tobacco: Never Used  . Alcohol use No  . Drug use: No  . Sexual activity: Not Asked   Other Topics Concern  . None   Social History Narrative  . None   Additional Social History:      Endorses that he is grown up all over [place]. States mom died when he was 64 years old and that he lost a brother too. Says he keeps in touch with his other family members but is not close with any of them. Is on SSI, says he does not like his assisted living facility but it's okay. Says the last time he worked was at the age of 78 and that was at [employer]. He has not worked after this. Education - ninth grade  Sleep: Good  Appetite:  Good  Current Medications: Current Facility-Administered Medications  Medication Dose Route Frequency Provider Last Rate Last Dose  . acetaminophen (TYLENOL) tablet 650 mg  650 mg Oral Q6H PRN Aundria Rud, MD   650 mg at 01/18/17 2108  . albuterol (PROVENTIL) (2.5 MG/3ML) 0.083% nebulizer solution 2.5 mg  2.5 mg Nebulization Q6H PRN Aundria Rud, MD      . alum & mag hydroxide-simeth (MAALOX/MYLANTA) 200-200-20 MG/5ML suspension 30 mL  30 mL Oral Q4H PRN Aundria Rud, MD      . atorvastatin (LIPITOR) tablet 10 mg  10 mg Oral q1800 Aundria Rud, MD   10 mg at 01/18/17 1721  . benztropine (COGENTIN) tablet 1 mg  1 mg Oral Daily Aundria Rud, MD   1 mg at 01/18/17 0815  . bisacodyl (DULCOLAX) EC tablet 5 mg  5 mg Oral Daily PRN Aundria Rud, MD      . diphenhydrAMINE (BENADRYL) capsule 50 mg  50 mg Oral QHS Aundria Rud, MD   50 mg at 01/18/17 2108  . divalproex (DEPAKOTE ER) 24 hr tablet 1,000 mg  1,000 mg Oral BID Aundria Rud, MD   1,000 mg at 01/18/17 1721  . haloperidol (HALDOL) tablet 10 mg  10 mg Oral BID Delesa Kawa B, MD   10 mg at 01/18/17  1721  . ipratropium (ATROVENT) nebulizer solution 0.5 mg  0.5 mg Nebulization Q6H PRN Aundria Rud, MD      . lisinopril (PRINIVIL,ZESTRIL) tablet 10 mg  10 mg Oral Daily Aundria Rud, MD   10 mg at 01/18/17 0823  . magnesium hydroxide (MILK OF MAGNESIA) suspension 30 mL  30 mL Oral Daily PRN Aundria Rud, MD      . metFORMIN (GLUCOPHAGE) tablet 1,000 mg  1,000 mg Oral BID WC Aundria Rud, MD   1,000 mg at 01/18/17 1720  . multivitamin with minerals tablet 1 tablet  1 tablet Oral Daily Aundria Rud, MD   1 tablet at 01/18/17 0815  . traZODone (DESYREL) tablet 100 mg  100 mg Oral QHS Aundria Rud, MD   100 mg at 01/18/17 2108    Lab Results:  No results found for this or any previous visit (from the past 48 hour(s)).  Blood Alcohol level:  Lab Results  Component Value Date   ETH <5 01/11/2017   ETH <5 01/10/2017    Metabolic Disorder Labs: No results found for: HGBA1C, MPG No results found for: PROLACTIN No results found for: CHOL, TRIG, HDL, CHOLHDL, VLDL, LDLCALC  Physical Findings: AIMS: Facial and Oral Movements Muscles of Facial Expression: None, normal Lips and Perioral Area: None, normal Jaw: None, normal Tongue: None, normal,Extremity Movements Upper (arms, wrists, hands, fingers): None, normal Lower (legs, knees, ankles, toes): None, normal, Trunk Movements Neck, shoulders, hips: None, normal, Overall Severity Severity of abnormal movements (highest score from questions above): None, normal Incapacitation due to abnormal movements: None, normal Patient's awareness of abnormal movements (rate only patient's report): No Awareness, Dental Status Current problems with teeth and/or dentures?: No Does patient usually wear dentures?: No  CIWA:    COWS:     Musculoskeletal: Strength & Muscle Tone: within normal limits Gait & Station: normal Patient leans: N/A  Psychiatric Specialty Exam: Physical Exam  Nursing note and vitals  reviewed. Psychiatric: His affect is blunt. His speech is delayed. He is slowed, withdrawn and actively hallucinating. Thought content is paranoid and delusional. Cognition and memory are normal. He expresses impulsivity.    Review of Systems  Psychiatric/Behavioral: Positive for hallucinations.  All other systems reviewed and are negative.   Blood pressure 112/72, pulse (!) 58, temperature 98.1 F (36.7 C), temperature source Oral, resp. rate 16, height 6' (1.829 m), weight 86.2 kg (190 lb), SpO2 98 %.Body mass index is 25.77 kg/m.  General Appearance: Guarded  Eye Contact:  Good  Speech:  Normal Rate  Volume:  Normal  Mood:  Dysphoric  Affect:  Flat  Thought Process:  Coherent  Orientation:  Full (Time, Place, and Person)  Thought Content:  deies  Suicidal Thoughts:  No  Homicidal Thoughts:  No  Judgement:  Poor  Insight:  Fair  Psychomotor Activity:  Normal  AIMS (if indicated):     Assets:  Others:  He is compliant on his medications  ADL's:  Intact  Cognition:  Not assessed  Sleep:  Number of Hours: 7.5   Treatment Plan Summary: Daily contact with patient to assess and evaluate symptoms and progress in treatment and Medication management   Mr. Reali is 26 year old gentleman with a history of schizoaffective disorder presenting to the ER on the 31st after Benadryl overdose and hyponatremia. Endorses that he does not remember how he reached the emergency room after admission.  At present he has a flat affect, apart from being guarded I cannot elicit any other acute psychopathology.  1. Suicidal ideation. The patient is able to contract for safety in the hospital.  2. Mood and psychosis. He was continued on Haldol, Cogentin and Depakote for psychosis and mood stabilization. I will switch Haldol to 10 mg twice daily. He refuses Haldol decanoate injection. I will start Luvox tonight.  3. Hyponatremia. Resolved.  4. Diabetes. The patient refuses insulin. We continued  metformin.  5. Dyslipidemia. Continue atorvastatin.   6. Constipation. Continue bisacodyl.   7. Insomnia. Continue trazodone.  8. HTN. Continue lisinopril.  9. Smoking cessation. Nicotine patch is available.   10. Metabolic syndrome monitoring. Lipid panel, TSH and HgbA1C are pending.  11. EKG. Sinus rhythm, QTc 456.  12. Social. The patient is an incompetent adult. DSS in [facility] is his guardian.   13. Disposition. He will be discharged to his group home. Follow up with ACT team.  Kristine Linea, MD 01/19/2017, 7:38 AM

## 2017-01-19 NOTE — [facility] Group Notes (Signed)
[facility] Group Notes:  (Nursing/MHT/Case Management/Adjunct)  Date:  01/19/2017  Time:  9:41 PM  Type of Therapy:  Group Therapy  Participation Level:  Active  Participation Quality:  Appropriate  Affect:  Appropriate  Cognitive:  Alert  Insight:  Good  Engagement in Group:  Supportive  Modes of Intervention:  Support  Summary of Progress/Problems:  John Copeland 01/19/2017, 9:41 PM

## 2017-01-19 NOTE — [facility] Group Notes (Signed)
[facility] LCSW Group Therapy 01/19/2017 1:15pm  Type of Therapy: Group Therapy- Feelings Around Discharge & Establishing a Supportive Framework  Participation Level:  Did Not Attend  Description of Group:   What is a supportive framework? What does it look like feel like and how do I discern it from and unhealthy non-supportive network? Learn how to cope when supports are not helpful and don't support you. Discuss what to do when your family/friends are not supportive.   Therapeutic Modalities:   Cognitive Behavioral Therapy Person-Centered Therapy Motivational Interviewing   John Copeland C Carmin Dibartolo, LCSW 01/19/2017 2:07 PM     

## 2017-01-19 NOTE — Progress Notes (Signed)
Patient isolative to is room, was observed laying in bed with covers over his head. Writer had to call patient several times for him to wake up and take his medications. He appeared flat in affect and avoided eye contact. Writer had to take medications to his room as he refused to leave his room this morning.  He required prompts to answer any questions and looked down. He however, denied having thoughts of self-harm. Patient also denied +ve AH saying "am not hearing anything."

## 2017-01-20 LAB — LIPID PANEL
CHOL/HDL RATIO: 2.9 ratio
Cholesterol: 103 mg/dL (ref 0–200)
HDL: 36 mg/dL — ABNORMAL LOW (ref >40)
LDL CALC: 57 mg/dL (ref 0–99)
TRIGLYCERIDES: 48 mg/dL (ref <150)
VLDL: 10 mg/dL (ref 0–40)

## 2017-01-20 LAB — HEMOGLOBIN A1C
Hgb A1c MFr Bld: 5.8 % — ABNORMAL HIGH (ref 4.8–5.6)
MEAN PLASMA GLUCOSE: 119.76 mg/dL

## 2017-01-20 MED ORDER — TRAZODONE HCL 100 MG PO TABS: 100.0000 mg | ORAL_TABLET | Freq: Every day | ORAL | 0 refills | Status: DC

## 2017-01-20 MED ORDER — BENZTROPINE MESYLATE 1 MG PO TABS: 1.0000 mg | ORAL_TABLET | Freq: Every day | ORAL | 1 refills | Status: DC

## 2017-01-20 MED ORDER — METFORMIN HCL 1000 MG PO TABS: 1000.0000 mg | ORAL_TABLET | Freq: Two times a day (BID) | ORAL | 1 refills | Status: DC

## 2017-01-20 MED ORDER — ADULT MULTIVITAMIN W/MINERALS CH: 1.0000 | ORAL_TABLET | Freq: Every day | ORAL | 1 refills | Status: DC

## 2017-01-20 MED ORDER — FLUVOXAMINE MALEATE 100 MG PO TABS: 100.0000 mg | ORAL_TABLET | Freq: Every day | ORAL | 1 refills | Status: DC

## 2017-01-20 MED ORDER — DIVALPROEX SODIUM ER 250 MG PO TB24: 750.0000 mg | ORAL_TABLET | Freq: Two times a day (BID) | ORAL | 1 refills | Status: DC

## 2017-01-20 MED ORDER — DIVALPROEX SODIUM ER 250 MG PO TB24
750.0000 mg | ORAL_TABLET | Freq: Two times a day (BID) | ORAL | Status: DC
  Administered 2017-01-20 – 2017-01-21 (×2): 750 mg via ORAL
  Filled 2017-01-20 (×3): qty 1

## 2017-01-20 MED ORDER — FLUVOXAMINE MALEATE 50 MG PO TABS
100.0000 mg | ORAL_TABLET | Freq: Every day | ORAL | Status: DC
  Administered 2017-01-20: 100 mg via ORAL
  Filled 2017-01-20: qty 2

## 2017-01-20 MED ORDER — HALOPERIDOL 10 MG PO TABS: 10.0000 mg | ORAL_TABLET | Freq: Two times a day (BID) | ORAL | 1 refills | Status: DC

## 2017-01-20 MED ORDER — ATORVASTATIN CALCIUM 10 MG PO TABS: 10.0000 mg | ORAL_TABLET | Freq: Every day | ORAL | 1 refills | Status: DC

## 2017-01-20 MED ORDER — LISINOPRIL 10 MG PO TABS: 10.0000 mg | ORAL_TABLET | Freq: Every day | ORAL | 1 refills | Status: DC

## 2017-01-20 NOTE — Progress Notes (Signed)
Patient ID: John Copeland, Charmian Muffmale   DOB: 23-Jan-1991, 26 y.o.   MRN: [id number] LCSW Group Therapy Note  01/20/2017 1pm  Type of Therapy and Topic:  Group Therapy:  Cognitive Distortions  Participation Level:  Did Not Attend   Description of Group:    Patients in this group will be introduced to the topic of cognitive distortions.  Patients will identify and describe cognitive distortions, describe the feelings these distortions create for them.  Patients will identify one or more situations in their personal life where they have cognitively distorted thinking and will verbalize challenging this cognitive distortion through positive thinking skills.  Patients will practice the skill of using positive affirmations to challenge cognitive distortions using affirmation cards.    Therapeutic Goals:  1. Patient will identify two or more cognitive distortions they have used 2. Patient will identify one or more emotions that stem from use of a cognitive distortion 3. Patient will demonstrate use of a positive affirmation to counter a cognitive distortion through discussion and/or role play. 4. Patient will describe one way cognitive distortions can be detrimental to wellness   Summary of Patient Progress:     Therapeutic Modalities:   Cognitive Behavioral Therapy Motivational Interviewing   Glennon MacSara P Sabri Teal, LCSW 01/20/2017 4:42 PM

## 2017-01-20 NOTE — [facility] Group Notes (Signed)
[facility] Group Notes:  (Nursing/MHT/Case Management/Adjunct)  Date:  01/20/2017  Time:  10:09 PM  Type of Therapy:  Group Therapy  Participation Level:  Active  Participation Quality:  Appropriate  Affect:  Appropriate  Cognitive:  Appropriate  Insight:  Appropriate  Engagement in Group:  Engaged  Modes of Intervention:  Discussion  Summary of Progress/Problems:  Oveda Dadamo Marie Karson Chicas 01/20/2017, 10:09 PM 

## 2017-01-20 NOTE — Progress Notes (Signed)
Patient remains isolative to is room, was observed laying in bed with covers over his head. This writer had to call patient several times for him to wake up and take his medications. He appeared flat in affect and avoids eye contact.  He requires prompting to answer any questions and does not engage in eye contact when communicating. He  denied having thoughts of self-harm or auditory hallucinations. Milieu remains safe with q 15 minute safety checks.

## 2017-01-20 NOTE — Progress Notes (Signed)
[facility] MD Progress Note  01/20/2017 7:10 AM John Copeland  MRN:  [id number] Subjective:   01/13/17 - Patient reports that he had a good night and would like to go home. On looking up Care Everywhere, I noticed that he has had multiple ED visits at [facility] in August. The notes from these ED visits detail him his being homeless, having relocated from [place] a month ago and being in different group homes with no corroboration available. They also detailed him as having requested for his medications, mainly Depakote and trazodone from [facility]. They also detailed that his legal guardian is John Copeland in [place]. The numbers to reach them [phone] and [phone]. When I asked patient about these details, he says he does not recall being near [place] initially. Later he confirms that it is possible that he was near [place]. Endorses he may have been in a shelter for some period of time before moving into his current place of stay which is [facility] care home. His agreeable to fluid restriction and asks if he can go home. Has been noticed to be quiet and isolative in his room with no interactions with other patients. Has been taking his medications.  01/14/17 - endorses that he had a good night is doing well. Has not been observed to be drinking water from the toilet. His agreeable to adhering to his fluid restriction regimen. Will repeat serum sodium tomorrow.  01/15/2017. John Copeland seems confused this morning. He refused to meet with treatment team. He kept his head under the covers when I tried to talk to him. He is "fine". He came to the hospital because "he did not feel right". He claims good complaince with medications but is unable to name any. He does not know his group home, how long he has been there, or how big it is. He has no somatic complaints and is ready to go home. Reports good sleep and appetite. SW spoke with the group home. The patient does not like it there and has been calling  [phone] repeatedly to get out. His ACT team reportedly working on new placement but the patient is alowed to return to his group home for now. Unable to reach his guardian, John Copeland, as of yet.   01/16/2017. John Copeland is in bed today with his head covered. He initially tells me that he is "OK" but does not answer any other questions. This is in contrast to our interaction yesterday when he insisted on going home and was more engaging. Per nursing, he only gets out of bed for meals and does not come to medication room without much encouragement. He was likely noncompliant with treatment prior to admission as his VPA level on admission was only 21. On the other hand, hyponatremia could have been caused by Depakote. He refused labs yesterday and today to recheck his sodium. This is important as he was restarted on 1000 mg twice daily of Depakote.  01/17/2017. John Copeland refuses to get out of bed or uncover his face. He hardly answers any of my questions but get upset when I talk about group participation or injections of Haldol. He denies any problems but was explained that he needs to follow our program to get discharged. We have not had communication with his guardian as of yet. He continues to refuse labs. They are necessary as he is back on Depakote and may experience hyponatremia.   01/18/2017. John Copeland talks to me this morning more. His head is not  covered completely and I could see his left eye. He tells me that he feels better, denies any symptoms of depression, anxiety, or psychosis. He is not suicidal. His guardian suggested that he might be depressed but the patient denies. He feels ready for discharge but he has not been participating in programming or interacting with staff or peers. He promised to try harder. There are no somatic complains. He stillrefuses labs and will not be discharge unless we check his VPA and Na level.   01/19/2017. John Copeland barely acknowledges my presence this morning. He is  "fine". I saw him out in the community in the afternoon yesterday. Even when out of his room, he does not interact with peers or staff. He denies suicidal ideation. He refuses labs because he is scared of needles. He was explained again that he will not be discharged before we obtain labs for VPA and sodium levels. His hygiene is rather poor. Very limited eye contact.  01/20/2017. John Copeland reports much improvement. He bravely agreed to labs yesterday in spite of his fear of needles. He is no longer hyponatremic and his VPA level is quite high. He claims to be medication compliant in the community but improved in the hospital in the same medications. Unfortunately, no VPA level was obtained at discharge. Also, he refuses Haldol decanoate. We never have gotten a clear answer from his guardian if he supports injectable against patient's will. There are no somatic complaints. No group participation. He sleep all morning but gets up later during the day. Anticipated discharge tomorrow.   Per nursing: Per report patient continues to sleep late into the morning and nap during the day. He is compliant with HS sleep medication.   Principal Problem: Schizoaffective disorder, bipolar type ([place]) Diagnosis:   Patient Active Problem List   Diagnosis Date Noted  . Polydipsia [R63.1] 01/12/2017  . Diphenhydramine overdose of undetermined intent [T45.0X4A] 01/10/2017  . Tobacco use disorder [F17.200] 10/07/2016  . Diabetes ([place]) [E11.9] 10/07/2016  . HTN (hypertension) [I10] 10/07/2016  . Dyslipidemia [E78.5] 10/07/2016  . Schizoaffective disorder, bipolar type ([place]) [F25.0] 11/30/2015   Total Time spent with patient: 30 minutes  Past Psychiatric History:  Endorses that the last time he had a normal life was when he was 26 years old. States he does not remember how long he's been on psychiatry medications. Dr. Weber Cooks' note states a diagnosis of schizophrenia disorder, multiple hospitalizations and not being  happy with the group home situation. Most recent medications of Depakote and Haldol  Past Medical History:  Past Medical History:  Diagnosis Date  . Anxiety   . Depression   . Diabetes mellitus without complication ([place])   . Schizophrenia ([place])    History reviewed. No pertinent surgical history. Family History: History reviewed. No pertinent family history. Family Psychiatric  History: Social History:  History  Alcohol Use No     History  Drug Use No    Social History   Social History  . Marital status: Single    Spouse name: N/A  . Number of children: N/A  . Years of education: N/A   Social History Main Topics  . Smoking status: Current Every Day Smoker    Packs/day: 0.50    Types: Cigarettes  . Smokeless tobacco: Never Used  . Alcohol use No  . Drug use: No  . Sexual activity: Not Asked   Other Topics Concern  . None   Social History Narrative  . None   Additional Social  History:      Endorses that he is grown up all over [place]. States mom died when he was 6 years old and that he lost a brother too. Says he keeps in touch with his other family members but is not close with any of them. Is on SSI, says he does not like his assisted living facility but it's okay. Says the last time he worked was at the age of 28 and that was at [employer]. He has not worked after this. Education - ninth grade                    Sleep: Good  Appetite:  Good  Current Medications: Current Facility-Administered Medications  Medication Dose Route Frequency Provider Last Rate Last Dose  . acetaminophen (TYLENOL) tablet 650 mg  650 mg Oral Q6H PRN Ramond Dial, MD   650 mg at 01/18/17 2108  . albuterol (PROVENTIL) (2.5 MG/3ML) 0.083% nebulizer solution 2.5 mg  2.5 mg Nebulization Q6H PRN Ramond Dial, MD      . alum & mag hydroxide-simeth (MAALOX/MYLANTA) 200-200-20 MG/5ML suspension 30 mL  30 mL Oral Q4H PRN Ramond Dial, MD      .  atorvastatin (LIPITOR) tablet 10 mg  10 mg Oral q1800 Ramond Dial, MD   10 mg at 01/19/17 1701  . benztropine (COGENTIN) tablet 1 mg  1 mg Oral Daily Ramond Dial, MD   1 mg at 01/19/17 0858  . bisacodyl (DULCOLAX) EC tablet 5 mg  5 mg Oral Daily PRN Ramond Dial, MD      . diphenhydrAMINE (BENADRYL) capsule 50 mg  50 mg Oral QHS Ramond Dial, MD   50 mg at 01/19/17 2153  . divalproex (DEPAKOTE ER) 24 hr tablet 1,000 mg  1,000 mg Oral QHS Beronica Lansdale B, MD      . divalproex (DEPAKOTE ER) 24 hr tablet 500 mg  500 mg Oral Q breakfast Anjanae Woehrle B, MD      . fluvoxaMINE (LUVOX) tablet 50 mg  50 mg Oral QHS Tapanga Ottaway B, MD   50 mg at 01/19/17 2158  . haloperidol (HALDOL) tablet 10 mg  10 mg Oral BID Tyaisha Cullom B, MD   10 mg at 01/19/17 1701  . ipratropium (ATROVENT) nebulizer solution 0.5 mg  0.5 mg Nebulization Q6H PRN Ramond Dial, MD      . lisinopril (PRINIVIL,ZESTRIL) tablet 10 mg  10 mg Oral Daily Ramond Dial, MD   10 mg at 01/19/17 0858  . magnesium hydroxide (MILK OF MAGNESIA) suspension 30 mL  30 mL Oral Daily PRN Ramond Dial, MD      . metFORMIN (GLUCOPHAGE) tablet 1,000 mg  1,000 mg Oral BID WC Ramond Dial, MD   1,000 mg at 01/19/17 1701  . multivitamin with minerals tablet 1 tablet  1 tablet Oral Daily Ramond Dial, MD   1 tablet at 01/19/17 0857  . traZODone (DESYREL) tablet 100 mg  100 mg Oral QHS Ramond Dial, MD   100 mg at 01/19/17 2153    Lab Results:  Results for orders placed or performed during the hospital encounter of 01/12/17 (from the past 48 hour(s))  Valproic acid level     Status: Abnormal   Collection Time: 01/19/17  7:07 PM  Result Value Ref Range   Valproic Acid Lvl 119 (H) 50.0 - 100.0 ug/mL  Ammonia     Status: Abnormal   Collection Time: 01/19/17  7:07 PM  Result Value Ref Range  Ammonia <9 (L) 9 - 35 umol/L  Comprehensive metabolic panel     Status: Abnormal    Collection Time: 01/19/17  7:07 PM  Result Value Ref Range   Sodium 136 135 - 145 mmol/L   Potassium 4.5 3.5 - 5.1 mmol/L   Chloride 99 (L) 101 - 111 mmol/L   CO2 28 22 - 32 mmol/L   Glucose, Bld 102 (H) 65 - 99 mg/dL   BUN 12 6 - 20 mg/dL   Creatinine, Ser 0.79 0.61 - 1.24 mg/dL   Calcium 9.5 8.9 - 10.3 mg/dL   Total Protein 7.0 6.5 - 8.1 g/dL   Albumin 4.1 3.5 - 5.0 g/dL   AST 16 15 - 41 U/L   ALT 13 (L) 17 - 63 U/L   Alkaline Phosphatase 38 38 - 126 U/L   Total Bilirubin 0.5 0.3 - 1.2 mg/dL   GFR calc non Af Amer >60 >60 mL/min   GFR calc Af Amer >60 >60 mL/min    Comment: (NOTE) The eGFR has been calculated using the CKD EPI equation. This calculation has not been validated in all clinical situations. eGFR's persistently <60 mL/min signify possible Chronic Kidney Disease.    Anion gap 9 5 - 15    Blood Alcohol level:  Lab Results  Component Value Date   ETH <5 01/11/2017   ETH <5 03/10/2535    Metabolic Disorder Labs: No results found for: HGBA1C, MPG No results found for: PROLACTIN No results found for: CHOL, TRIG, HDL, CHOLHDL, VLDL, LDLCALC  Physical Findings: AIMS: Facial and Oral Movements Muscles of Facial Expression: None, normal Lips and Perioral Area: None, normal Jaw: None, normal Tongue: None, normal,Extremity Movements Upper (arms, wrists, hands, fingers): None, normal Lower (legs, knees, ankles, toes): None, normal, Trunk Movements Neck, shoulders, hips: None, normal, Overall Severity Severity of abnormal movements (highest score from questions above): None, normal Incapacitation due to abnormal movements: None, normal Patient's awareness of abnormal movements (rate only patient's report): No Awareness, Dental Status Current problems with teeth and/or dentures?: No Does patient usually wear dentures?: No  CIWA:    COWS:     Musculoskeletal: Strength & Muscle Tone: within normal limits Gait & Station: normal Patient leans: N/A  Psychiatric  Specialty Exam: Physical Exam  Nursing note and vitals reviewed. Psychiatric: His affect is blunt. His speech is delayed. He is slowed, withdrawn and actively hallucinating. Thought content is paranoid and delusional. Cognition and memory are normal. He expresses impulsivity.    Review of Systems  Psychiatric/Behavioral: Positive for hallucinations.  All other systems reviewed and are negative.   Blood pressure 108/61, pulse (!) 53, temperature 97.9 F (36.6 C), temperature source Oral, resp. rate 16, height 6' (1.829 m), weight 86.2 kg (190 lb), SpO2 100 %.Body mass index is 25.77 kg/m.  General Appearance: Guarded  Eye Contact:  Good  Speech:  Normal Rate  Volume:  Normal  Mood:  Dysphoric  Affect:  Flat  Thought Process:  Coherent  Orientation:  Full (Time, Place, and Person)  Thought Content:  deies  Suicidal Thoughts:  No  Homicidal Thoughts:  No  Judgement:  Poor  Insight:  Fair  Psychomotor Activity:  Normal  AIMS (if indicated):     Assets:  Others:  He is compliant on his medications  ADL's:  Intact  Cognition:  Not assessed  Sleep:  Number of Hours: 7.5   Treatment Plan Summary: Daily contact with patient to assess and evaluate symptoms and progress in treatment and  Medication management   Mr. Mcwhirter is 26 year old gentleman with a history of schizoaffective disorder presenting to the ER on the 31st after Benadryl overdose and hyponatremia. Endorses that he does not remember how he reached the emergency room after admission.  At present he has a flat affect, apart from being guarded I cannot elicit any other acute psychopathology.  1. Suicidal ideation. The patient is Copeland to contract for safety in the hospital.  2. Mood and psychosis. He was continued on Haldol, Cogentin and Depakote for psychosis and mood stabilization. We increased Haldol to 10 mg twice daily. He refuses Haldol decanoate injection. VPA 119, ammonia low. We will decrease Depakote ER to 750 mg bid  and increase Luvox to 100 mg.  3. Hyponatremia. Resolved.  4. Diabetes. The patient refuses insulin. We continued metformin.  5. Dyslipidemia. Continue atorvastatin.   6. Constipation. Continue bisacodyl.   7. Insomnia. Continue trazodone.  8. HTN. Continue lisinopril.  9. Smoking cessation. Nicotine patch is available.   10. Metabolic syndrome monitoring. Lipid panel and HgbA1C are pending.  11. EKG. Sinus rhythm, QTc 456.  12. Social. The patient is an incompetent adult. DSS in [facility] is his guardian.   13. Disposition. He will be discharged to his group home. Follow up with ACT team.  Orson Slick, MD 01/20/2017, 7:10 AM

## 2017-01-20 NOTE — Plan of Care (Signed)
Problem: Activity: Goal: Interest or engagement in leisure activities will improve Outcome: Progressing Patient is less isolative with peers, appearance has  Improved. Goal: Imbalance in normal sleep/wake cycle will improve Outcome: Not Progressing Patient continues to sleep during the day.  Problem: Education: Goal: Knowledge of the prescribed therapeutic regimen will improve Outcome: Not Progressing Patient is not knowledgeable of prescribed regimen.

## 2017-01-21 NOTE — Progress Notes (Signed)
Patient denies SI/HI, denies A/V hallucinations. Patient verbalizes understanding of discharge instructions, follow up care and prescriptions. Patient given all belongingsr. Patient escorted out by staff, transported by group home staff.

## 2017-01-21 NOTE — Plan of Care (Signed)
Problem: Health Behavior/Discharge Planning: Goal: Compliance with therapeutic regimen will improve Outcome: Progressing Patient compliant with medication and received them out of bed in the medication room. He is able to verbalize church friends as sources of support and has a plan including healthy daily routines post discharge.   Problem: Activity: Goal: Interest or engagement in activities will improve Outcome: Progressing Patient observed in milieu. Sidelining but not avoiding peer contact.   Problem: Education: Goal: Emotional status will improve Outcome: Progressing Patient with brighter affect. Less guarded with this Clinical research associatewriter.

## 2017-01-21 NOTE — Discharge Summary (Signed)
Physician Discharge Summary Note  Patient:  John Copeland is an 26 y.o., male MRN:  [id number] DOB:  Sep 13, 1990 Patient phone:  [phone] (home)  Patient address:   [facility] [address] [place] [address],  Total Time spent with patient: 30 minutes  Date of Admission:  01/12/2017 Date of Discharge: 01/21/2017  Reason for Admission:  Overdose.  John Copeland is a 26 year old male with a history of schizoaffective disorder, diabetes, hypertension, dyslipidemia who presented to the ER because of bizarre behavior.  Excerpt from note by Dr Toni Amendlapacs "It's noted in one of the nursing notes that John Copeland had talked about how John Copeland was feeling very strange after smoking a cigarette. When I saw the patient in his room John Copeland was awake and made eye contact with me but wouldn't answer any of my questions. John Copeland was calm at that time but after I brought him some juice John Copeland made quite a mess with it in the next thing I saw John Copeland was on his knees reading a Bible and talking to himself and again refusing to answer my questions. This young man has a history of chronic mental illness. His act team had noticed that John Copeland had been decompensating somewhat recently and seemed to be getting more manic. Last time I talked with him was yesterday and at that time John Copeland was clearly hyper religious however yesterday John Copeland did let us give him a Haldol decanoate shot. John Copeland denies that John Copeland has used any drugs. Yesterday John Copeland had come to the hospital after taking an entire box of cough and cold medicine. I ask him if John Copeland had done the same thing again today and John Copeland denied it."  John Copeland says to me today "I don't know how I got to the emergency room yesterday. I don't want to talk about it." Endorses that things have not been going well at his assisted living home, says John Copeland does not want to elucidate any more than this. Asks me if I can get him something to eat, says that John Copeland had lunch but John Copeland is still hungry. States that hospitalization is  not going to help him at all and John Copeland would like to go home. Says John Copeland has been on psychiatry medications for a very long time and is unable to remember how long. Denies any thoughts of wanting to hurt himself or others.  Has a bald patch on the right side of his scalp and when I ask him how John Copeland got it John Copeland says that John Copeland tried to cut his hair at the assisted living facility yesterday and is unable to remember why John Copeland stopped. During the interview that instances wherein John Copeland loses attention and seems to be responding to internal stimuli. When I ask him if there is anyone I can get collateral from, John Copeland states that his nobody. His mom died when John Copeland was 26 years old and John Copeland has lost a brother. Says John Copeland is not close with anyone else.  Associated Signs/Symptoms: Depression Symptoms:  feelings of worthlessness/guilt, difficulty concentrating, hopelessness, (Hypo) Manic Symptoms:  none Anxiety Symptoms:  denies Psychotic Symptoms:  Hallucinations: ?Seems to be RTIS, Paranoia, PTSD Symptoms: Negative  Past Psychiatric History: Endorses that the last time John Copeland had a normal life was when John Copeland was 26 years old. States John Copeland does not remember how long John Copeland's been on psychiatry medications. Dr. Toni Amendlapacs' note states a diagnosis of schizophrenia disorder, multiple hospitalizations and not being happy with the group home situation. Most recent medications of Depakote and Haldol.  Family Psychiatric  History: unknown  Social History: Originally fro [place] area living in a group home in [place]. Sharl Ma from [facility] DSS is the guardian.    Principal Problem: Schizoaffective disorder, bipolar type [facility]) Discharge Diagnoses: Patient Active Problem List   Diagnosis Date Noted  . Polydipsia [R63.1] 01/12/2017  . Diphenhydramine overdose of undetermined intent [T45.0X4A] 01/10/2017  . Tobacco use disorder [F17.200] 10/07/2016  . Diabetes (HCC) [E11.9] 10/07/2016  . HTN (hypertension) [I10] 10/07/2016  .  Dyslipidemia [E78.5] 10/07/2016  . Schizoaffective disorder, bipolar type (HCC) [F25.0] 11/30/2015    Past Medical History:  Past Medical History:  Diagnosis Date  . Anxiety   . Depression   . Diabetes mellitus without complication (HCC)   . Schizophrenia (HCC)    History reviewed. No pertinent surgical history. Family History: History reviewed. No pertinent family history.  Social History:  History  Alcohol Use No     History  Drug Use No    Social History   Social History  . Marital status: Single    Spouse name: N/A  . Number of children: N/A  . Years of education: N/A   Social History Main Topics  . Smoking status: Current Every Day Smoker    Packs/day: 0.50    Types: Cigarettes  . Smokeless tobacco: Never Used  . Alcohol use No  . Drug use: No  . Sexual activity: Not Asked   Other Topics Concern  . None   Social History Narrative  . None    Hospital Course:    John Copeland is a 26 year old gentleman with a history of schizoaffective disorder presenting to the ER after Benadryl overdose and hyponatremia.  1. Suicidal ideation. Resolved. The patient is able to contract for safety. John Copeland is forward thinking and optimistic about the future.  2. Mood and psychosis. John Copeland was continued on Haldol, Cogentin and Depakote for psychosis and mood stabilization. We increased Haldol to 10 mg twice daily. John Copeland refuses Haldol decanoate injection. VPA 119 with low ammonia. We decrease Depakote ER to 750 mg twice daily. We started Luvox for depression and anxiety.   3. Hyponatremia. Most likely from excessive drinking. Resolved.  4. Diabetes. The patient refuses insulin. We continued metformin.  5. Dyslipidemia. Continue atorvastatin.   6. Constipation. Continue bisacodyl.   7. Insomnia. Continue trazodone.  8. HTN. Continue lisinopril.  9. Smoking cessation. Nicotine patch was available.   10. Metabolic syndrome monitoring. Lipid panel is normal, HgbA1C 5.8.    11. EKG. Sinus rhythm, QTc 456.  12. Social. The patient is an incompetent adult. DSS in [facility] [place] is his guardian.   13. Disposition. John Copeland was discharged to his group home. Follow up with ACT team.  Physical Findings: AIMS: Facial and Oral Movements Muscles of Facial Expression: None, normal Lips and Perioral Area: None, normal Jaw: None, normal Tongue: None, normal,Extremity Movements Upper (arms, wrists, hands, fingers): None, normal Lower (legs, knees, ankles, toes): None, normal, Trunk Movements Neck, shoulders, hips: None, normal, Overall Severity Severity of abnormal movements (highest score from questions above): None, normal Incapacitation due to abnormal movements: None, normal Patient's awareness of abnormal movements (rate only patient's report): No Awareness, Dental Status Current problems with teeth and/or dentures?: No Does patient usually wear dentures?: No  CIWA:    COWS:     Musculoskeletal: Strength & Muscle Tone: within normal limits Gait & Station: normal Patient leans: N/A  Psychiatric Specialty Exam: Physical Exam  Nursing note and vitals reviewed. Psychiatric: John Copeland has a  normal mood and affect. His speech is normal and behavior is normal. Thought content normal. Cognition and memory are normal. John Copeland expresses impulsivity.    Review of Systems  All other systems reviewed and are negative.   Blood pressure (!) 100/51, pulse (!) 56, temperature 97.6 F (36.4 C), temperature source Oral, resp. rate 18, height 6' (1.829 m), weight 86.2 kg (190 lb), SpO2 100 %.Body mass index is 25.77 kg/m.  General Appearance: Casual  Eye Contact:  Good  Speech:  Clear and Coherent  Volume:  Decreased  Mood:  Euthymic  Affect:  Blunt  Thought Process:  Goal Directed and Descriptions of Associations: Intact  Orientation:  Full (Time, Place, and Person)  Thought Content:  WDL  Suicidal Thoughts:  No  Homicidal Thoughts:  No  Memory:  Immediate;    Fair Recent;   Fair Remote;   Fair  Judgement:  Impaired  Insight:  Shallow  Psychomotor Activity:  Normal  Concentration:  Concentration: Fair and Attention Span: Fair  Recall:  [employer] of Knowledge:  Fair  Language:  Fair  Akathisia:  No  Handed:  Right  AIMS (if indicated):     Assets:  Communication Skills Desire for Improvement Financial Resources/Insurance Housing Physical Health Resilience Social Support  ADL's:  Intact  Cognition:  WNL  Sleep:  Number of Hours: 7.25     Have you used any form of tobacco in the last 30 days? (Cigarettes, Smokeless Tobacco, Cigars, and/or Pipes): No  Has this patient used any form of tobacco in the last 30 days? (Cigarettes, Smokeless Tobacco, Cigars, and/or Pipes) Yes, Yes, A prescription for an FDA-approved tobacco cessation medication was offered at discharge and the patient refused  Blood Alcohol level:  Lab Results  Component Value Date   [facility] <5 01/11/2017   ETH <5 01/10/2017    Metabolic Disorder Labs:  Lab Results  Component Value Date   HGBA1C 5.8 (H) 01/19/2017   MPG 119.76 01/19/2017   No results found for: PROLACTIN Lab Results  Component Value Date   CHOL 103 01/19/2017   TRIG 48 01/19/2017   HDL 36 (L) 01/19/2017   CHOLHDL 2.9 01/19/2017   VLDL 10 01/19/2017   LDLCALC 57 01/19/2017    See Psychiatric Specialty Exam and Suicide Risk Assessment completed by Attending Physician prior to discharge.  Discharge destination:  Home  Is patient on multiple antipsychotic therapies at discharge:  No   Has Patient had three or more failed trials of antipsychotic monotherapy by history:  No  Recommended Plan for Multiple Antipsychotic Therapies: NA  Discharge Instructions    Diet - low sodium heart healthy    Complete by:  As directed    Increase activity slowly    Complete by:  As directed      Allergies as of 01/21/2017   No Known Allergies     Medication List    TAKE these medications      Indication  atorvastatin 10 MG tablet Commonly known as:  LIPITOR Take 1 tablet (10 mg total) by mouth daily at 6 PM.  Indication:  High Amount of Fats in the Blood   benztropine 1 MG tablet Commonly known as:  COGENTIN Take 1 tablet (1 mg total) by mouth daily.  Indication:  Extrapyramidal Reaction caused by Medications   diphenhydrAMINE 50 MG capsule Commonly known as:  BENADRYL Take 1 capsule (50 mg total) by mouth at bedtime.  Indication:  Extrapyramidal Reaction   divalproex 250 MG 24  hr tablet Commonly known as:  DEPAKOTE ER Take 3 tablets (750 mg total) by mouth 2 (two) times daily at 8 am and 10 pm. What changed:  medication strength  how much to take  when to take this  Indication:  Mixed Bipolar Affective Disorder   fluvoxaMINE 100 MG tablet Commonly known as:  LUVOX Take 1 tablet (100 mg total) by mouth at bedtime.  Indication:  Major Depressive Disorder   haloperidol 10 MG tablet Commonly known as:  HALDOL Take 1 tablet (10 mg total) by mouth 2 (two) times daily. What changed:  medication strength  how much to take  when to take this  Indication:  Psychosis   lisinopril 10 MG tablet Commonly known as:  PRINIVIL,ZESTRIL Take 1 tablet (10 mg total) by mouth daily.  Indication:  High Blood Pressure Disorder   metFORMIN 1000 MG tablet Commonly known as:  GLUCOPHAGE Take 1 tablet (1,000 mg total) by mouth 2 (two) times daily with a meal.  Indication:  Type 2 Diabetes   multivitamin with minerals Tabs tablet Take 1 tablet by mouth daily.  Indication:  general health   traZODone 100 MG tablet Commonly known as:  DESYREL Take 1 tablet (100 mg total) by mouth at bedtime.  Indication:  Trouble Sleeping      Follow-up [employer] [facility], Psychotherapeutic. Go on 01/20/2017.   Why:  [facility] to pick up and transport home, will continue ACTT services as appropriate. Contact information: [address] [place]  [address] [phone]           Follow-up recommendations:  Activity:  as tolerated. Diet:  low sodium heart healthy ADA diet. Other:  keep follow up appointments.  Comments:    Signed: Kristine Linea, MD 01/21/2017, 8:41 AM

## 2017-01-21 NOTE — Tx Team (Signed)
Interdisciplinary Treatment and Diagnostic Plan Update  01/21/2017 Time of Session: 11:05 John MuffJoshua Omar Copeland MRN: [id number]  Principal Diagnosis: Schizoaffective disorder, bipolar type [facility](HCC)  Secondary Diagnoses: Principal Problem:   Schizoaffective disorder, bipolar type (HCC) Active Problems:   Tobacco use disorder   Diabetes (HCC)   HTN (hypertension)   Dyslipidemia   Diphenhydramine overdose of undetermined intent   Polydipsia   Current Medications:  Current Facility-Administered Medications  Medication Dose Route Frequency Provider Last Rate Last Dose  . acetaminophen (TYLENOL) tablet 650 mg  650 mg Oral Q6H PRN Aundria Rudakesh, Gopalkumar, MD   650 mg at 01/18/17 2108  . albuterol (PROVENTIL) (2.5 MG/3ML) 0.083% nebulizer solution 2.5 mg  2.5 mg Nebulization Q6H PRN Aundria Rudakesh, Gopalkumar, MD      . alum & mag hydroxide-simeth (MAALOX/MYLANTA) 200-200-20 MG/5ML suspension 30 mL  30 mL Oral Q4H PRN Aundria Rudakesh, Gopalkumar, MD      . atorvastatin (LIPITOR) tablet 10 mg  10 mg Oral q1800 Aundria Rudakesh, Gopalkumar, MD   10 mg at 01/20/17 1708  . benztropine (COGENTIN) tablet 1 mg  1 mg Oral Daily Aundria Rudakesh, Gopalkumar, MD   1 mg at 01/21/17 0816  . bisacodyl (DULCOLAX) EC tablet 5 mg  5 mg Oral Daily PRN Aundria Rudakesh, Gopalkumar, MD      . diphenhydrAMINE (BENADRYL) capsule 50 mg  50 mg Oral QHS Aundria Rudakesh, Gopalkumar, MD   50 mg at 01/20/17 2133  . divalproex (DEPAKOTE ER) 24 hr tablet 750 mg  750 mg Oral BID AC & HS Pucilowska, Jolanta B, MD   750 mg at 01/21/17 0817  . fluvoxaMINE (LUVOX) tablet 100 mg  100 mg Oral QHS Pucilowska, Jolanta B, MD   100 mg at 01/20/17 2133  . haloperidol (HALDOL) tablet 10 mg  10 mg Oral BID Pucilowska, Jolanta B, MD   10 mg at 01/21/17 0816  . ipratropium (ATROVENT) nebulizer solution 0.5 mg  0.5 mg Nebulization Q6H PRN Aundria Rudakesh, Gopalkumar, MD      . lisinopril (PRINIVIL,ZESTRIL) tablet 10 mg  10 mg Oral Daily Aundria Rudakesh, Gopalkumar, MD   10 mg at 01/21/17 0816  . magnesium hydroxide  (MILK OF MAGNESIA) suspension 30 mL  30 mL Oral Daily PRN Aundria Rudakesh, Gopalkumar, MD      . metFORMIN (GLUCOPHAGE) tablet 1,000 mg  1,000 mg Oral BID WC Aundria Rudakesh, Gopalkumar, MD   1,000 mg at 01/21/17 0816  . multivitamin with minerals tablet 1 tablet  1 tablet Oral Daily Aundria Rudakesh, Gopalkumar, MD   1 tablet at 01/21/17 0816  . traZODone (DESYREL) tablet 100 mg  100 mg Oral QHS Aundria Rudakesh, Gopalkumar, MD   100 mg at 01/20/17 2133   PTA Medications: Prescriptions Prior to Admission  Medication Sig Dispense Refill Last Dose  . diphenhydrAMINE (BENADRYL) 50 MG capsule Take 1 capsule (50 mg total) by mouth at bedtime. 30 capsule 0 Past Month at Unknown time  . divalproex (DEPAKOTE ER) 500 MG 24 hr tablet Take 2 tablets (1,000 mg total) by mouth 2 (two) times daily. 120 tablet 0 Past Month at Unknown time    Patient Stressors: Educational concerns Occupational concerns  Patient Strengths: Other: Patient has congnitive delayed function  Treatment Modalities: Medication Management, Group therapy, Case management,  1 to 1 session with clinician, Psychoeducation, Recreational therapy.   Physician Treatment Plan for Primary Diagnosis: Schizoaffective disorder, bipolar type (HCC) Long Term Goal(s): Improvement in symptoms so as ready for discharge Improvement in symptoms so as ready for discharge   Short Term Goals: psychoeducation on importance of  medications. Compliance with prescribed medications will improve  Medication Management: Evaluate patient's response, side effects, and tolerance of medication regimen.  Therapeutic Interventions: 1 to 1 sessions, Unit Group sessions and Medication administration.  Evaluation of Outcomes: Adequate for Discharge  Physician Treatment Plan for Secondary Diagnosis: Principal Problem:   Schizoaffective disorder, bipolar type (HCC) Active Problems:   Tobacco use disorder   Diabetes (HCC)   HTN (hypertension)   Dyslipidemia   Diphenhydramine overdose of  undetermined intent   Polydipsia  Long Term Goal(s): Improvement in symptoms so as ready for discharge Improvement in symptoms so as ready for discharge   Short Term Goals: psychoeducation on importance of medications. Compliance with prescribed medications will improve     Medication Management: Evaluate patient's response, side effects, and tolerance of medication regimen.  Therapeutic Interventions: 1 to 1 sessions, Unit Group sessions and Medication administration.  Evaluation of Outcomes: Adequate for Discharge   RN Treatment Plan for Primary Diagnosis: Schizoaffective disorder, bipolar type (HCC) Long Term Goal(s): Knowledge of disease and therapeutic regimen to maintain health will improve  Short Term Goals: Ability to remain free from injury will improve  Medication Management: RN will administer medications as ordered by provider, will assess and evaluate patient's response and provide education to patient for prescribed medication. RN will report any adverse and/or side effects to prescribing provider.  Therapeutic Interventions: 1 on 1 counseling sessions, Psychoeducation, Medication administration, Evaluate responses to treatment, Monitor vital signs and CBGs as ordered, Perform/monitor CIWA, COWS, AIMS and Fall Risk screenings as ordered, Perform wound care treatments as ordered.  Evaluation of Outcomes: Adequate for Discharge   LCSW Treatment Plan for Primary Diagnosis: Schizoaffective disorder, bipolar type (HCC) Long Term Goal(s): Safe transition to appropriate next level of care at discharge, Engage patient in therapeutic group addressing interpersonal concerns.  Short Term Goals: Engage patient in aftercare planning with referrals and resources  Therapeutic Interventions: Assess for all discharge needs, 1 to 1 time with Social worker, Explore available resources and support systems, Assess for adequacy in community support network, Educate family and significant  other(s) on suicide prevention, Complete Psychosocial Assessment, Interpersonal group therapy.  Evaluation of Outcomes: Adequate for Discharge   Progress in Treatment: Attending groups: No. Participating in groups: No. Taking medication as prescribed: Yes. Toleration medication: Yes. Family/Significant other contact made: Yes, individual(s) contacted:  Guardian   Patient understands diagnosis: Yes. Discussing patient identified problems/goals with staff: No. Medical problems stabilized or resolved: Yes. Denies suicidal/homicidal ideation: Yes. Issues/concerns per patient self-inventory: Yes. Other:   New problem(s) identified: No, Describe:  none reported  New Short Term/Long Term Goal(s): Pt is discharging today and did not have a goal.  Discharge Plan or Barriers: Pt will continue services with [facility].  Reason for Continuation of Hospitalization: Discharge today.     Estimated Length of Stay:  Discharge today.  Attendees: Patient:John Copeland 01/21/2017   Physician: Dr. Jennet Maduro, MD 01/21/2017   Nursing: Shelia Media, RN 01/21/2017   RN Care Manager: 01/21/2017   Social Worker: Daleen Squibb, LCSW 01/21/2017   Recreational Therapist:  01/21/2017   Other:  01/21/2017   Other:  01/21/2017   Other: 01/21/2017        Scribe for Treatment Team: Lorri Frederick, LCSW 01/21/2017 1:51 PM

## 2017-01-21 NOTE — Progress Notes (Signed)
PT guardian, Patsey BertholdMarty, [place] [facility], [phone], called and message left that pt being discharged today. Garner NashGregory Dymin Dingledine, MSW, LCSW Clinical Social Worker 01/21/2017 11:28 AM

## 2017-01-21 NOTE — Progress Notes (Signed)
  [facility] :  Will you be returning to the same living situation after discharge:  Yes,    At discharge, do you have transportation home?: Yes,    Do you have the ability to pay for your medications: Yes,     Release of information consent forms completed and in the chart;  Patient's signature needed at discharge.  Patient to Follow up at: Follow-up Information    [facility], [facility]. Go on 01/20/2017.   Why:  [facility] to pick up and transport home, will continue ACTT services as appropriate. Contact information: [address] [place]  [place] [address] [phone]           Next level of care provider [facility]:no  Safety Planning and Suicide Prevention discussed: Yes,     Have you used any form of tobacco in the last 30 days? (Cigarettes, Smokeless Tobacco, Cigars, and/or Pipes): No  Has patient been referred to the Quitline?: N/A patient is not a smoker  Patient has been referred for addiction treatment: Yes  Glennon MacSara P Apolo Cutshaw, LCSW 01/21/2017, 12:45 PM

## 2017-01-21 NOTE — BHH Suicide Risk Assessment (Signed)
[facility]   Principal Problem: Schizoaffective disorder, bipolar type [facility](HCC) Discharge Diagnoses:  Patient Active Problem List   Diagnosis Date Noted  . Polydipsia [R63.1] 01/12/2017  . Diphenhydramine overdose of undetermined intent [T45.0X4A] 01/10/2017  . Tobacco use disorder [F17.200] 10/07/2016  . Diabetes (HCC) [E11.9] 10/07/2016  . HTN (hypertension) [I10] 10/07/2016  . Dyslipidemia [E78.5] 10/07/2016  . Schizoaffective disorder, bipolar type (HCC) [F25.0] 11/30/2015    Total Time spent with patient: 30 minutes  Musculoskeletal: Strength & Muscle Tone: within normal limits Gait & Station: normal Patient leans: N/A  Psychiatric Specialty Exam: Review of Systems  All other systems reviewed and are negative.   Blood pressure (!) 100/51, pulse (!) 56, temperature 97.6 F (36.4 C), temperature source Oral, resp. rate 18, height 6' (1.829 m), weight 86.2 kg (190 lb), SpO2 100 %.Body mass index is 25.77 kg/m.  General Appearance: Casual  Eye Contact::  Good  Speech:  Clear and Coherent409  Volume:  Decreased  Mood:  Euthymic  Affect:  Blunt  Thought Process:  Goal Directed and Descriptions of Associations: Intact  Orientation:  Full (Time, Place, and Person)  Thought Content:  WDL  Suicidal Thoughts:  No  Homicidal Thoughts:  No  Memory:  Immediate;   Fair Recent;   Fair Remote;   Fair  Judgement:  Impaired  Insight:  Shallow  Psychomotor Activity:  Decreased  Concentration:  Fair  Recall:  [employer]  Fund of Knowledge:Fair  Language: Fair  Akathisia:  No  Handed:  Right  AIMS (if indicated):     Assets:  Communication Skills Desire for Improvement Financial Resources/Insurance Housing Physical Health Resilience Social Support  Sleep:  Number of Hours: 7.25  Cognition: WNL  ADL's:  Intact   Mental Status Per Nursing Assessment::   On Admission:     Demographic Factors:  Male and Adolescent or young adult  Loss  Factors: NA  Historical Factors: Prior suicide attempts, Family history of mental illness or substance abuse and Impulsivity  Risk Reduction Factors:   Living with another person, especially a relative and Positive social support  Continued Clinical Symptoms:  Schizophrenia:   Depressive state Less than 26 years old Paranoid or undifferentiated type  Cognitive Features That Contribute To Risk:  None    Suicide Risk:  Minimal: No identifiable suicidal ideation.  Patients presenting with no risk factors but with morbid ruminations; may be classified as minimal risk based on the severity of the depressive symptoms  Follow-up Information    Services, Psychotherapeutic. Go on 01/20/2017.   Why:  [facility] to pick up and transport home, will continue ACTT services as appropriate. Contact information: [address] [place] [id number] [phone]           Plan Of Care/Follow-up recommendations:  Activity:  as tolerated. Diet:  low sodium heart healthy ADA diet. Other:  keep follow up appointments.  Kristine LineaJolanta Cornel Werber, MD 01/21/2017, 8:40 AM

## 2017-01-21 NOTE — [place] FL2 (Signed)
[place] MEDICAID FL2 LEVEL OF CARE SCREENING TOOL     IDENTIFICATION  Patient Name: John Copeland Birthdate: 11/26/1990 Sex: male Admission Date (Current Location): 01/12/2017  [place] and [place] Number:  [address] [id number] [facility] Facility and Address:  [facility], [address], [place], [place] [address]      Provider Number: [id number]  Attending Physician Name and Address:  Shari Prows, MD  Relative Name and Phone Number:  Legal Evonnie Pat Legal Guardian [phone]     Current Level of Care: Hospital Recommended Level of Care: Family Care Home Prior Approval Number:    Date Approved/Denied:   PASRR Number:    Discharge Plan: Other (Comment) (Family Care Home)    Current Diagnoses: Patient Active Problem List   Diagnosis Date Noted  . 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 Diphenhydramine overdose of undetermined intent 01/10/2017  . Tobacco use disorder 10/07/2016  . Diabetes (HCC) 10/07/2016  . HTN (hypertension) 10/07/2016  . Dyslipidemia 10/07/2016  . Schizoaffective disorder, bipolar type (HCC) 11/30/2015    Orientation RESPIRATION BLADDER Height & Weight     Self, Time, Situation, Place  Normal Continent Weight: 190 lb (86.2 kg) Height:  6' (182.9 cm)  BEHAVIORAL SYMPTOMS/MOOD NEUROLOGICAL BOWEL NUTRITION STATUS     (N/A) Continent    AMBULATORY STATUS COMMUNICATION OF NEEDS Skin   Independent Verbally Normal                       Personal Care Assistance Level of Assistance       N/A       Functional Limitations Info  Sight, Hearing, Speech (NONE) Sight Info: Adequate Hearing Info: Adequate Speech Info: Adequate    SPECIAL CARE FACTORS FREQUENCY       N/A                Contractures Contractures Info: Not present    Additional Factors Info                  Current Medications (01/21/2017):  This is the current hospital active medication list Current Facility-Administered  Medications  Medication Dose Route Frequency Provider Last Rate Last Dose  . acetaminophen (TYLENOL) tablet 650 mg  650 mg Oral Q6H PRN Aundria Rud, MD   650 mg at 01/18/17 2108  . albuterol (PROVENTIL) (2.5 MG/3ML) 0.083% nebulizer solution 2.5 mg  2.5 mg Nebulization Q6H PRN Aundria Rud, MD      . alum & mag hydroxide-simeth (MAALOX/MYLANTA) 200-200-20 MG/5ML suspension 30 mL  30 mL Oral Q4H PRN Aundria Rud, MD      . atorvastatin (LIPITOR) tablet 10 mg  10 mg Oral q1800 Aundria Rud, MD   10 mg at 01/20/17 1708  . benztropine (COGENTIN) tablet 1 mg  1 mg Oral Daily Aundria Rud, MD   1 mg at 01/21/17 0816  . bisacodyl (DULCOLAX) EC tablet 5 mg  5 mg Oral Daily PRN Aundria Rud, MD      . diphenhydrAMINE (BENADRYL) capsule 50 mg  50 mg Oral QHS Aundria Rud, MD   50 mg at 01/20/17 2133  . divalproex (DEPAKOTE ER) 24 hr tablet 750 mg  750 mg Oral BID AC & HS Pucilowska, Jolanta B, MD   750 mg at 01/21/17 0817  . fluvoxaMINE (LUVOX) tablet 100 mg  100 mg Oral QHS Pucilowska, Jolanta B, MD   100 mg at 01/20/17 2133  . haloperidol (HALDOL) tablet 10 mg  10 mg Oral BID Pucilowska, Jolanta B,  MD   10 mg at 01/21/17 0816  . ipratropium (ATROVENT) nebulizer solution 0.5 mg  0.5 mg Nebulization Q6H PRN Aundria Rudakesh, Gopalkumar, MD      . lisinopril (PRINIVIL,ZESTRIL) tablet 10 mg  10 mg Oral Daily Aundria Rudakesh, Gopalkumar, MD   10 mg at 01/21/17 0816  . magnesium hydroxide (MILK OF MAGNESIA) suspension 30 mL  30 mL Oral Daily PRN Aundria Rudakesh, Gopalkumar, MD      . metFORMIN (GLUCOPHAGE) tablet 1,000 mg  1,000 mg Oral BID WC Aundria Rudakesh, Gopalkumar, MD   1,000 mg at 01/21/17 0816  . multivitamin with minerals tablet 1 tablet  1 tablet Oral Daily Aundria Rudakesh, Gopalkumar, MD   1 tablet at 01/21/17 0816  . traZODone (DESYREL) tablet 100 mg  100 mg Oral QHS Aundria Rudakesh, Gopalkumar, MD   100 mg at 01/20/17 2133     Discharge Medications:  TAKE these medications     Indication  atorvastatin  10 MG tablet Commonly known as:  LIPITOR Take 1 tablet (10 mg total) by mouth daily at 6 PM.  Indication:  High Amount of Fats in the Blood   benztropine 1 MG tablet Commonly known as:  COGENTIN Take 1 tablet (1 mg total) by mouth daily.  Indication:  Extrapyramidal Reaction caused by Medications   diphenhydrAMINE 50 MG capsule Commonly known as:  BENADRYL Take 1 capsule (50 mg total) by mouth at bedtime.  Indication:  Extrapyramidal Reaction   divalproex 250 MG 24 hr tablet Commonly known as:  DEPAKOTE ER Take 3 tablets (750 mg total) by mouth 2 (two) times daily at 8 am and 10 pm. What changed:  medication strength  how much to take  when to take this  Indication:  Mixed Bipolar Affective Disorder   fluvoxaMINE 100 MG tablet Commonly known as:  LUVOX Take 1 tablet (100 mg total) by mouth at bedtime.  Indication:  Major Depressive Disorder   haloperidol 10 MG tablet Commonly known as:  HALDOL Take 1 tablet (10 mg total) by mouth 2 (two) times daily. What changed:  medication strength  how much to take  when to take this  Indication:  Psychosis   lisinopril 10 MG tablet Commonly known as:  PRINIVIL,ZESTRIL Take 1 tablet (10 mg total) by mouth daily.  Indication:  High Blood Pressure Disorder   metFORMIN 1000 MG tablet Commonly known as:  GLUCOPHAGE Take 1 tablet (1,000 mg total) by mouth 2 (two) times daily with a meal.  Indication:  Type 2 Diabetes   multivitamin with minerals Tabs tablet Take 1 tablet by mouth daily.  Indication:  general health   traZODone 100 MG tablet Commonly known as:  DESYREL Take 1 tablet (100 mg total) by mouth at bedtime.  Indication:  Trouble Sleeping     Relevant Imaging Results:  Relevant Lab Results:   Additional Information N/A  Glennon MacSara P Mathilda Maguire, LCSW

## 2017-01-21 NOTE — Progress Notes (Signed)
Patient ID: John Copeland, male   DOB: 09/06/1990, 26 y.o.   MRN: [id number] CSW spoke with Pt's group home staff, Jimmye NormanLawanda Ray who said they were not able to transport Pt home due to other residents having appointments today. CSW contacted Helmut Musterlicia with [facility] who agreed to transport home as part of their hospital follow up.    Jake SharkSara Masie Bermingham, LCSW

## 2017-03-12 ENCOUNTER — Emergency Department
Admission: EM | Admit: 2017-03-12 | Discharge: 2017-03-12 | Discharge status: Against Medical Advice | Payer: Medicaid Other

## 2017-03-12 NOTE — ED Notes (Signed)
Pt called in lobby with no response 

## 2017-03-12 NOTE — ED Notes (Signed)
Attempted to call group home with no answer 

## 2017-03-12 NOTE — ED Notes (Signed)
Attempted to call pts legal guardian with no response

## 2017-03-12 NOTE — ED Notes (Signed)
Pt arrives via EMS, per EMS pt was seen walking down the street and someone called because he "was dropping his bag", upon arrival pt has no complaints

## 2017-03-12 NOTE — ED Notes (Signed)
Called for triage with no response  

## 2017-03-15 ENCOUNTER — Emergency Department
Admission: EM | Admit: 2017-03-15 | Discharge: 2017-03-15 | Disposition: A | Discharge status: Home / Self Care | Payer: Medicaid Other | Attending: Emergency Medicine | Admitting: Emergency Medicine

## 2017-03-15 DIAGNOSIS — F329 Major depressive disorder, single episode, unspecified: Secondary | ICD-10-CM | POA: Diagnosis not present

## 2017-03-15 DIAGNOSIS — F17210 Nicotine dependence, cigarettes, uncomplicated: Secondary | ICD-10-CM | POA: Insufficient documentation

## 2017-03-15 DIAGNOSIS — F419 Anxiety disorder, unspecified: Secondary | ICD-10-CM | POA: Insufficient documentation

## 2017-03-15 DIAGNOSIS — F209 Schizophrenia, unspecified: Secondary | ICD-10-CM | POA: Insufficient documentation

## 2017-03-15 DIAGNOSIS — Z7984 Long term (current) use of oral hypoglycemic drugs: Secondary | ICD-10-CM | POA: Diagnosis not present

## 2017-03-15 DIAGNOSIS — Z79899 Other long term (current) drug therapy: Secondary | ICD-10-CM | POA: Diagnosis not present

## 2017-03-15 DIAGNOSIS — I10 Essential (primary) hypertension: Secondary | ICD-10-CM | POA: Insufficient documentation

## 2017-03-15 DIAGNOSIS — F250 Schizoaffective disorder, bipolar type: Secondary | ICD-10-CM | POA: Diagnosis not present

## 2017-03-15 DIAGNOSIS — E119 Type 2 diabetes mellitus without complications: Secondary | ICD-10-CM | POA: Diagnosis not present

## 2017-03-15 LAB — COMPREHENSIVE METABOLIC PANEL
ALBUMIN: 4.5 g/dL (ref 3.5–5.0)
ALK PHOS: 47 U/L (ref 38–126)
ALT: 50 U/L (ref 17–63)
ANION GAP: 6 (ref 5–15)
AST: 46 U/L — ABNORMAL HIGH (ref 15–41)
BUN: <5 mg/dL — ABNORMAL LOW (ref 6–20)
CALCIUM: 9.6 mg/dL (ref 8.9–10.3)
CHLORIDE: 105 mmol/L (ref 101–111)
CO2: 25 mmol/L (ref 22–32)
Creatinine, Ser: 0.91 mg/dL (ref 0.61–1.24)
GFR calc Af Amer: >60 mL/min (ref >60)
GFR calc non Af Amer: >60 mL/min (ref >60)
Glucose, Bld: 117 mg/dL — ABNORMAL HIGH (ref 65–99)
POTASSIUM: 4.3 mmol/L (ref 3.5–5.1)
SODIUM: 136 mmol/L (ref 135–145)
Total Bilirubin: 0.8 mg/dL (ref 0.3–1.2)
Total Protein: 7.7 g/dL (ref 6.5–8.1)

## 2017-03-15 LAB — URINALYSIS, COMPLETE (UACMP) WITH MICROSCOPIC
Bacteria, UA: NONE SEEN
GLUCOSE, UA: NEGATIVE mg/dL
HGB URINE DIPSTICK: NEGATIVE
KETONES UR: NEGATIVE mg/dL
LEUKOCYTES UA: NEGATIVE
NITRITE: NEGATIVE
PH: 6 (ref 5.0–8.0)
Protein, ur: NEGATIVE mg/dL
SPECIFIC GRAVITY, URINE: 1.015 (ref 1.005–1.030)
Squamous Epithelial / LPF: NONE SEEN

## 2017-03-15 LAB — CBC WITH DIFFERENTIAL/PLATELET
BASOS PCT: 0 %
Basophils Absolute: 0 10*3/uL (ref 0–0.1)
EOS ABS: 0 10*3/uL (ref 0–0.7)
Eosinophils Relative: 1 %
HCT: 39.4 % — ABNORMAL LOW (ref 40.0–52.0)
HEMOGLOBIN: 12.6 g/dL — AB (ref 13.0–18.0)
Lymphocytes Relative: 21 %
Lymphs Abs: 1.6 10*3/uL (ref 1.0–3.6)
MCH: 24.1 pg — ABNORMAL LOW (ref 26.0–34.0)
MCHC: 32 g/dL (ref 32.0–36.0)
MCV: 75.1 fL — ABNORMAL LOW (ref 80.0–100.0)
MONOS PCT: 6 %
Monocytes Absolute: 0.4 10*3/uL (ref 0.2–1.0)
NEUTROS PCT: 72 %
Neutro Abs: 5.3 10*3/uL (ref 1.4–6.5)
PLATELETS: 329 10*3/uL (ref 150–440)
RBC: 5.25 MIL/uL (ref 4.40–5.90)
RDW: 17.4 % — ABNORMAL HIGH (ref 11.5–14.5)
WBC: 7.4 10*3/uL (ref 3.8–10.6)

## 2017-03-15 LAB — SALICYLATE LEVEL

## 2017-03-15 LAB — ACETAMINOPHEN LEVEL: Acetaminophen (Tylenol), Serum: <10 ug/mL — ABNORMAL LOW (ref 10–30)

## 2017-03-15 LAB — URINE DRUG SCREEN, QUALITATIVE (ARMC ONLY)
Amphetamines, Ur Screen: NOT DETECTED
BARBITURATES, UR SCREEN: NOT DETECTED
BENZODIAZEPINE, UR SCRN: NOT DETECTED
COCAINE METABOLITE, UR ~~LOC~~: NOT DETECTED
Cannabinoid 50 Ng, Ur ~~LOC~~: NOT DETECTED
MDMA (Ecstasy)Ur Screen: NOT DETECTED
Methadone Scn, Ur: NOT DETECTED
OPIATE, UR SCREEN: NOT DETECTED
PHENCYCLIDINE (PCP) UR S: NOT DETECTED
Tricyclic, Ur Screen: NOT DETECTED

## 2017-03-15 LAB — ETHANOL: Alcohol, Ethyl (B): <10 mg/dL (ref <10)

## 2017-03-15 NOTE — ED Notes (Signed)

## 2017-03-15 NOTE — Consult Note (Signed)
[facility] Consult   Reason for Consult: Consult for 26 year old man sent in under IVC because he had walked away from his group home Referring Physician:  Clearnce Hasten Patient Identification: John Copeland MRN:  [id number] Principal Diagnosis: Schizoaffective disorder, bipolar type [facility]) Diagnosis:   Patient Active Problem List   Diagnosis Date Noted  . Polydipsia [R63.1] 01/12/2017  . Diphenhydramine overdose of undetermined intent [T45.0X4A] 01/10/2017  . Tobacco use disorder [F17.200] 10/07/2016  . Diabetes ([place]) [E11.9] 10/07/2016  . HTN (hypertension) [I10] 10/07/2016  . Dyslipidemia [E78.5] 10/07/2016  . Schizoaffective disorder, bipolar type ([place]) [F25.0] 11/30/2015    Total Time spent with patient: 1 hour  Subjective:   John Copeland is a 26 y.o. male patient admitted with "I made a mistake".  HPI: Patient interviewed chart reviewed.  26 year old man with a history of schizophrenia.  Reportedly had walked away from his group home and was walking out in the middle of the street when police picked him up.  Patient states that he had left the group home because he intended to walk to the bus stop where he could get a bus to take him to [place] because he had a $10 gift card to go to check folate.  Patient denies that he had any thoughts of hurting himself or hurting anyone else.  He has no recent complaints about his mood.  He says that he sleeps fine and eats fine.  Denies that he has been having any hallucinations.  Denies any psychotic symptoms.  Says that he is compliant with all of his medicine.  Patient repeatedly states that this is all a misunderstanding and he simply wanted to go to [place] and forgot to sign out.  He says that he regrets not having signed out and followed proper procedure and intends to do so in the future.  Social history: Patient has a legal guardian.  He lives in a group home.  Has a history of some difficulty with walking  away and being difficult to keep an eye on.  Medical history Hypertension.  Diabetes managed with oral medicine.  Substance abuse history: Denies any alcohol or drug abuse no significant past alcohol or drug abuse problems  Past Psychiatric History: Patient has a history of schizophrenia or schizoaffective disorder and has had several visits to the emergency room as well as hospital admission.  Most recent hospitalization earlier this year.  When psychotic he tends to become agitated and hyper religious.  Does have a history of dangerous behavior.  Currently appears to be largely compliant with medicine.  Risk to Self: Is patient at risk for suicide?: No Risk to Others:   Prior Inpatient Therapy:   Prior Outpatient Therapy:    Past Medical History:  Past Medical History:  Diagnosis Date  . Anxiety   . Depression   . Diabetes mellitus without complication ([place])   . Schizophrenia ([place])    No past surgical history on file. Family History: No family history on file. Family Psychiatric  History: No known family history Social History:  History  Alcohol Use No     History  Drug Use No    Social History   Social History  . Marital status: Single    Spouse name: N/A  . Number of children: N/A  . Years of education: N/A   Social History Main Topics  . Smoking status: Current Every Day Smoker    Packs/day: 0.50    Types: Cigarettes  . Smokeless tobacco: Never  Used  . Alcohol use No  . Drug use: No  . Sexual activity: Not on file   Other Topics Concern  . Not on file   Social History Narrative  . No narrative on file   Additional Social History:    Allergies:  No Known Allergies  Labs:  Results for orders placed or performed during the hospital encounter of 03/15/17 (from the past 48 hour(s))  CBC with Differential     Status: Abnormal   Collection Time: 03/15/17 12:46 PM  Result Value Ref Range   WBC 7.4 3.8 - 10.6 K/uL   RBC 5.25 4.40 - 5.90 MIL/uL   Hemoglobin  12.6 (L) 13.0 - 18.0 g/dL   HCT 39.4 (L) 40.0 - 52.0 %   MCV 75.1 (L) 80.0 - 100.0 fL   MCH 24.1 (L) 26.0 - 34.0 pg   MCHC 32.0 32.0 - 36.0 g/dL   RDW 17.4 (H) 11.5 - 14.5 %   Platelets 329 150 - 440 K/uL   Neutrophils Relative % 72 %   Neutro Abs 5.3 1.4 - 6.5 K/uL   Lymphocytes Relative 21 %   Lymphs Abs 1.6 1.0 - 3.6 K/uL   Monocytes Relative 6 %   Monocytes Absolute 0.4 0.2 - 1.0 K/uL   Eosinophils Relative 1 %   Eosinophils Absolute 0.0 0 - 0.7 K/uL   Basophils Relative 0 %   Basophils Absolute 0.0 0 - 0.1 K/uL  Comprehensive metabolic panel     Status: Abnormal   Collection Time: 03/15/17 12:46 PM  Result Value Ref Range   Sodium 136 135 - 145 mmol/L   Potassium 4.3 3.5 - 5.1 mmol/L   Chloride 105 101 - 111 mmol/L   CO2 25 22 - 32 mmol/L   Glucose, Bld 117 (H) 65 - 99 mg/dL   BUN <5 (L) 6 - 20 mg/dL   Creatinine, Ser 0.91 0.61 - 1.24 mg/dL   Calcium 9.6 8.9 - 10.3 mg/dL   Total Protein 7.7 6.5 - 8.1 g/dL   Albumin 4.5 3.5 - 5.0 g/dL   AST 46 (H) 15 - 41 U/L   ALT 50 17 - 63 U/L   Alkaline Phosphatase 47 38 - 126 U/L   Total Bilirubin 0.8 0.3 - 1.2 mg/dL   GFR calc non Af Amer >60 >60 mL/min   GFR calc Af Amer >60 >60 mL/min    Comment: (NOTE) The eGFR has been calculated using the CKD EPI equation. This calculation has not been validated in all clinical situations. eGFR's persistently <60 mL/min signify possible Chronic Kidney Disease.    Anion gap 6 5 - 15  Ethanol     Status: None   Collection Time: 03/15/17 12:46 PM  Result Value Ref Range   Alcohol, Ethyl (B) <10 <10 mg/dL    Comment:        LOWEST DETECTABLE LIMIT FOR SERUM ALCOHOL IS 10 mg/dL FOR MEDICAL PURPOSES ONLY   Acetaminophen level     Status: Abnormal   Collection Time: 03/15/17 12:46 PM  Result Value Ref Range   Acetaminophen (Tylenol), Serum <10 (L) 10 - 30 ug/mL    Comment:        THERAPEUTIC CONCENTRATIONS VARY SIGNIFICANTLY. A RANGE OF 10-30 ug/mL MAY BE AN EFFECTIVE CONCENTRATION  FOR MANY PATIENTS. HOWEVER, SOME ARE BEST TREATED AT CONCENTRATIONS OUTSIDE THIS RANGE. ACETAMINOPHEN CONCENTRATIONS >150 ug/mL AT 4 HOURS AFTER INGESTION AND >50 ug/mL AT 12 HOURS AFTER INGESTION ARE OFTEN ASSOCIATED WITH TOXIC REACTIONS.  Salicylate level     Status: None   Collection Time: 03/15/17 12:46 PM  Result Value Ref Range   Salicylate Lvl <1.4 2.8 - 30.0 mg/dL  Urinalysis, Complete w Microscopic     Status: Abnormal   Collection Time: 03/15/17 12:46 PM  Result Value Ref Range   Color, Urine YELLOW (A) YELLOW   APPearance CLEAR (A) CLEAR   Specific Gravity, Urine 1.015 1.005 - 1.030   pH 6.0 5.0 - 8.0   Glucose, UA NEGATIVE NEGATIVE mg/dL   Hgb urine dipstick NEGATIVE NEGATIVE   Bilirubin Urine SMALL (A) NEGATIVE   Ketones, ur NEGATIVE NEGATIVE mg/dL   Protein, ur NEGATIVE NEGATIVE mg/dL   Nitrite NEGATIVE NEGATIVE   Leukocytes, UA NEGATIVE NEGATIVE   RBC / HPF 0-5 0 - 5 RBC/hpf   WBC, UA 0-5 0 - 5 WBC/hpf   Bacteria, UA NONE SEEN NONE SEEN   Squamous Epithelial / LPF NONE SEEN NONE SEEN   Hyaline Casts, UA PRESENT   Urine Drug Screen, Qualitative ([facility] only)     Status: None   Collection Time: 03/15/17 12:46 PM  Result Value Ref Range   Tricyclic, Ur Screen NONE DETECTED NONE DETECTED   Amphetamines, Ur Screen NONE DETECTED NONE DETECTED   MDMA (Ecstasy)Ur Screen NONE DETECTED NONE DETECTED   Cocaine Metabolite,Ur [place] NONE DETECTED NONE DETECTED   Opiate, Ur Screen NONE DETECTED NONE DETECTED   Phencyclidine (PCP) Ur S NONE DETECTED NONE DETECTED   Cannabinoid 50 Ng, Ur [place] NONE DETECTED NONE DETECTED   Barbiturates, Ur Screen NONE DETECTED NONE DETECTED   Benzodiazepine, Ur Scrn NONE DETECTED NONE DETECTED   Methadone Scn, Ur NONE DETECTED NONE DETECTED    Comment: (NOTE) 782  Tricyclics, urine               Cutoff 1000 ng/mL 200  Amphetamines, urine             Cutoff 1000 ng/mL 300  MDMA (Ecstasy), urine           Cutoff 500 ng/mL 400  Cocaine  Metabolite, urine       Cutoff 300 ng/mL 500  Opiate, urine                   Cutoff 300 ng/mL 600  Phencyclidine (PCP), urine      Cutoff 25 ng/mL 700  Cannabinoid, urine              Cutoff 50 ng/mL 800  Barbiturates, urine             Cutoff 200 ng/mL 900  Benzodiazepine, urine           Cutoff 200 ng/mL 1000 Methadone, urine                Cutoff 300 ng/mL 1100 1200 The urine drug screen provides only a preliminary, unconfirmed 1300 analytical test result and should not be used for non-medical 1400 purposes. Clinical consideration and professional judgment should 1500 be applied to any positive drug screen result due to possible 1600 interfering substances. A more specific alternate chemical method 1700 must be used in order to obtain a confirmed analytical result.  1800 Gas chromato graphy / mass spectrometry (GC/MS) is the preferred 1900 confirmatory method.     No current facility-administered medications for this encounter.    Current Outpatient Prescriptions  Medication Sig Dispense Refill  . atorvastatin (LIPITOR) 10 MG tablet Take 1 tablet (10 mg total) by mouth daily at 6  PM. 30 tablet 1  . benztropine (COGENTIN) 1 MG tablet Take 1 tablet (1 mg total) by mouth daily. 30 tablet 1  . diphenhydrAMINE (BENADRYL) 50 MG capsule Take 1 capsule (50 mg total) by mouth at bedtime. 30 capsule 0  . divalproex (DEPAKOTE ER) 250 MG 24 hr tablet Take 3 tablets (750 mg total) by mouth 2 (two) times daily at 8 am and 10 pm. 180 tablet 1  . fluvoxaMINE (LUVOX) 100 MG tablet Take 1 tablet (100 mg total) by mouth at bedtime. 30 tablet 1  . haloperidol (HALDOL) 10 MG tablet Take 1 tablet (10 mg total) by mouth 2 (two) times daily. 60 tablet 1  . lisinopril (PRINIVIL,ZESTRIL) 10 MG tablet Take 1 tablet (10 mg total) by mouth daily. 30 tablet 1  . metFORMIN (GLUCOPHAGE) 1000 MG tablet Take 1 tablet (1,000 mg total) by mouth 2 (two) times daily with a meal. 60 tablet 1  . Multiple Vitamin  (MULTIVITAMIN WITH MINERALS) TABS tablet Take 1 tablet by mouth daily. 30 tablet 1  . traZODone (DESYREL) 100 MG tablet Take 1 tablet (100 mg total) by mouth at bedtime. 30 tablet 0    Musculoskeletal: Strength & Muscle Tone: within normal limits Gait & Station: normal Patient leans: N/A  Psychiatric Specialty Exam: Physical Exam  Nursing note and vitals reviewed. Constitutional: He appears well-developed and well-nourished.  HENT:  Head: Normocephalic and atraumatic.  Eyes: Pupils are equal, round, and reactive to light. Conjunctivae are normal.  Neck: Normal range of motion.  Cardiovascular: Regular rhythm and normal heart sounds.   Respiratory: Effort normal and breath sounds normal.  GI: Soft.  Musculoskeletal: Normal range of motion.  Neurological: He is alert.  Skin: Skin is warm and dry.  Psychiatric: He has a normal mood and affect. His behavior is normal. Judgment and thought content normal.    Review of Systems  Constitutional: Negative.   HENT: Negative.   Eyes: Negative.   Respiratory: Negative.   Cardiovascular: Negative.   Gastrointestinal: Negative.   Musculoskeletal: Negative.   Skin: Negative.   Neurological: Negative.   Psychiatric/Behavioral: Negative for depression, hallucinations, memory loss, substance abuse and suicidal ideas. The patient is not nervous/anxious and does not have insomnia.     Pulse 76, temperature 98.1 F (36.7 C), resp. rate 18, height 5' 11"  (1.803 m), weight 81.6 kg (180 lb), SpO2 99 %.Body mass index is 25.1 kg/m.  General Appearance: Fairly Groomed  Eye Contact:  Good  Speech:  Clear and Coherent  Volume:  Decreased  Mood:  Euthymic  Affect:  Constricted  Thought Process:  Goal Directed  Orientation:  Full (Time, Place, and Person)  Thought Content:  Logical  Suicidal Thoughts:  No  Homicidal Thoughts:  No  Memory:  Immediate;   Fair Recent;   Fair Remote;   Fair  Judgement:  Fair  Insight:  Fair  Psychomotor  Activity:  Decreased  Concentration:  Concentration: Fair  Recall:  [employer] of Knowledge:  Fair  Language:  Fair  Akathisia:  No  Handed:  Right  AIMS (if indicated):     Assets:  Desire for Improvement Financial Resources/Insurance Housing Resilience  ADL's:  Intact  Cognition:  Impaired,  Mild  Sleep:        Treatment Plan Summary: Plan 26 year old man with schizophrenia.  Since coming to the emergency room his behavior has been calm.  Not showing outward signs of psychosis.  Able to answer questions and converse lucidly.  Denies  any suicidal or homicidal thoughts and is not behaving in an agitated manner.  No evidence of meeting commitment criteria.  Patient says several times that he regrets not signing out and he intends to follow protocol more closely so that this sort of misunderstanding does not happen.  Discontinue IVC.  No new prescriptions required.  Case reviewed with emergency room physician.  Patient can be discharged back to his group home and his act team.  Disposition: Patient does not meet criteria for psychiatric inpatient admission. Supportive therapy provided about ongoing stressors.  Alethia Berthold, MD 03/15/2017 3:12 PM

## 2017-03-15 NOTE — ED Notes (Signed)
BEHAVIORAL HEALTH ROUNDING Patient sleeping: No. Patient alert and oriented: yes Behavior appropriate: Yes.  ; If no, describe:  Nutrition and fluids offered: yes Toileting and hygiene offered: Yes  Sitter present: q15 minute observations and security  monitoring Law enforcement present: Yes  ODS   Pt to discharge back to [facility] care [facility] - his care provided  Mr John Copeland has arrived and we discussed the discharge instructions  He signed

## 2017-03-15 NOTE — ED Provider Notes (Signed)
[facility] Emergency Department Provider Note  ____________________________________________   First MD Initiated Contact with Patient 03/15/17 1226     (approximate)  I have reviewed the triage vital signs and the nursing notes.   HISTORY  Chief Complaint No chief complaint on file.   HPI John Copeland is a 26 y.o. male with a history of schizophrenia and diabetes who is presenting to the emergency department today via EMS because of concern that he was about to walk into the road.  Per EMS, the patient said that he wanted to get away from his group home because people were being mean to him there.  Per the patient, he says that he was just trying to go to check for life.  He denies trying to walk into the street.  He denies any suicidal or homicidal ideation.  Denies any hallucinations.  Denies any pain or any other medical complaints.    Past Medical History:  Diagnosis Date  . Anxiety   . Depression   . Diabetes mellitus without complication (HCC)   . Schizophrenia [facility])     Patient Active Problem List   Diagnosis Date Noted  . 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 Diphenhydramine overdose of undetermined intent 01/10/2017  . Tobacco use disorder 10/07/2016  . Diabetes (HCC) 10/07/2016  . HTN (hypertension) 10/07/2016  . Dyslipidemia 10/07/2016  . Schizoaffective disorder, bipolar type (HCC) 11/30/2015    No past surgical history on file.  Prior to Admission medications   Medication Sig Start Date End Date Taking? Authorizing Provider  atorvastatin (LIPITOR) 10 MG tablet Take 1 tablet (10 mg total) by mouth daily at 6 PM. 01/21/17   Pucilowska, Jolanta B, MD  benztropine (COGENTIN) 1 MG tablet Take 1 tablet (1 mg total) by mouth daily. 01/21/17   Pucilowska, Jolanta B, MD  diphenhydrAMINE (BENADRYL) 50 MG capsule Take 1 capsule (50 mg total) by mouth at bedtime. 10/12/16   Jimmy Footman, MD  divalproex (DEPAKOTE ER) 250 MG 24 hr tablet  Take 3 tablets (750 mg total) by mouth 2 (two) times daily at 8 am and 10 pm. 01/20/17   Pucilowska, Jolanta B, MD  fluvoxaMINE (LUVOX) 100 MG tablet Take 1 tablet (100 mg total) by mouth at bedtime. 01/20/17   Pucilowska, Jolanta B, MD  haloperidol (HALDOL) 10 MG tablet Take 1 tablet (10 mg total) by mouth 2 (two) times daily. 01/21/17   Pucilowska, Jolanta B, MD  lisinopril (PRINIVIL,ZESTRIL) 10 MG tablet Take 1 tablet (10 mg total) by mouth daily. 01/21/17   Pucilowska, Braulio Conte B, MD  metFORMIN (GLUCOPHAGE) 1000 MG tablet Take 1 tablet (1,000 mg total) by mouth 2 (two) times daily with a meal. 01/21/17   Pucilowska, Jolanta B, MD  Multiple Vitamin (MULTIVITAMIN WITH MINERALS) TABS tablet Take 1 tablet by mouth daily. 01/20/17   Pucilowska, Braulio Conte B, MD  traZODone (DESYREL) 100 MG tablet Take 1 tablet (100 mg total) by mouth at bedtime. 01/20/17   Pucilowska, Ellin Goodie, MD    Allergies Patient has no known allergies.  No family history on file.  Social History Social History  Substance Use Topics  . Smoking status: Current Every Day Smoker    Packs/day: 0.50    Types: Cigarettes  . Smokeless tobacco: Never Used  . Alcohol use No    Review of Systems  Constitutional: No fever/chills Eyes: No visual changes. ENT: No sore throat. Cardiovascular: Denies chest pain. Respiratory: Denies shortness of breath. Gastrointestinal: No abdominal pain.  No nausea,  no vomiting.  No diarrhea.  No constipation. Genitourinary: Negative for dysuria. Musculoskeletal: Negative for back pain. Skin: Negative for rash. Neurological: Negative for headaches, focal weakness or numbness.   ____________________________________________   PHYSICAL EXAM:  VITAL SIGNS: ED Triage Vitals  Enc Vitals Group     BP      Pulse      Resp      Temp      Temp src      SpO2      Weight      Height      Head Circumference      Peak Flow      Pain Score      Pain Loc      Pain Edu?      Excl. in GC?      Constitutional: Alert and oriented.  Disheveled but answering questions appropriately.  Patient does not appear to be responding to any internal stimuli.  Patient appears clinically sober. Eyes: Conjunctivae are normal.  Head: Atraumatic. Nose: No congestion/rhinnorhea. Mouth/Throat: Mucous membranes are moist.  Neck: No stridor.   Cardiovascular: Normal rate, regular rhythm. Grossly normal heart sounds.  Good peripheral circulation. Respiratory: Normal respiratory effort.  No retractions. Lungs CTAB. Gastrointestinal: Soft and nontender. No distention. No CVA tenderness. Musculoskeletal: No lower extremity tenderness nor edema.  No joint effusions. Neurologic:  Normal speech and language. No gross focal neurologic deficits are appreciated. Skin:  Skin is warm, dry and intact. No rash noted. Psychiatric: Mood and affect are normal. Speech and behavior are normal.  ____________________________________________   LABS (all labs ordered are listed, but only abnormal results are displayed)  Labs Reviewed - No data to display ____________________________________________  EKG   ____________________________________________  RADIOLOGY   ____________________________________________   PROCEDURES  Procedure(s) performed:   Procedures  Critical Care performed:   ____________________________________________   INITIAL IMPRESSION / ASSESSMENT AND PLAN / ED COURSE  Pertinent labs & imaging results that were available during my care of the patient were reviewed by me and considered in my medical decision making (see chart for details).  DDX: Medical screening exam  As part of my medical decision making, I reviewed the following data within the electronic MEDICAL RECORD NUMBER Notes from prior ED visits   ----------------------------------------- 12:46 PM on 03/15/2017 -----------------------------------------  Group home is reporting the patient has been expressing  hyperreligiosity and sometimes walking into traffic and stopping cars.  We will evaluate the patient with a psychiatry consult as well as obtain lab work.  Patient continues to be calm and cooperative at this time.     ----------------------------------------- 2:21 PM on 03/15/2017 -----------------------------------------  Patient calm and cooperative.  Reassuring lab work.  Seen by Dr. Toni Amendlapacs and deemed appropriate for outpatient follow-up.  Patient will be discharged back to his group home.  Patient continues to deny any suicidal or homicidal ideation.  ____________________________________________   FINAL CLINICAL IMPRESSION(S) / ED DIAGNOSES  Schizophrenia    NEW MEDICATIONS STARTED DURING THIS VISIT:  New Prescriptions   No medications on file     Note:  This document was prepared using Dragon voice recognition software and may include unintentional dictation errors.     Myrna BlazerSchaevitz, Tyla Burgner Matthew, MD 03/15/17 [id number]

## 2017-03-15 NOTE — ED Notes (Signed)
BEHAVIORAL HEALTH ROUNDING Patient sleeping: Yes.   Patient alert and oriented: eyes closed  Appears to be asleep Behavior appropriate: Yes.  ; If no, describe:  Nutrition and fluids offered: Yes  Toileting and hygiene offered: sleeping Sitter present: q 15 minute observations and security monitoring Law enforcement present: yes  ODS 

## 2017-03-15 NOTE — ED Notes (Signed)
Pt given meal tray.

## 2017-03-15 NOTE — ED Triage Notes (Signed)
He arrives today via [facility] from [facility]  [phone] pt verbalizing to EMS  "I walked away to get away from the staff"   He has been returned to the group home by police two times recently after he was found walking in the middle of the road - [facility] staff reports that he is hypereligious and sometimes he stops cars as he walks in the road  Alert upon arrival

## 2017-03-16 ENCOUNTER — Emergency Department
Admission: EM | Admit: 2017-03-16 | Discharge: 2017-03-16 | Disposition: A | Discharge status: Home / Self Care | Payer: Medicaid Other | Attending: Emergency Medicine | Admitting: Emergency Medicine

## 2017-03-16 DIAGNOSIS — I10 Essential (primary) hypertension: Secondary | ICD-10-CM | POA: Diagnosis not present

## 2017-03-16 DIAGNOSIS — Z7984 Long term (current) use of oral hypoglycemic drugs: Secondary | ICD-10-CM | POA: Diagnosis not present

## 2017-03-16 DIAGNOSIS — E119 Type 2 diabetes mellitus without complications: Secondary | ICD-10-CM | POA: Diagnosis not present

## 2017-03-16 DIAGNOSIS — F17210 Nicotine dependence, cigarettes, uncomplicated: Secondary | ICD-10-CM | POA: Diagnosis not present

## 2017-03-16 DIAGNOSIS — F250 Schizoaffective disorder, bipolar type: Secondary | ICD-10-CM | POA: Insufficient documentation

## 2017-03-16 DIAGNOSIS — R454 Irritability and anger: Secondary | ICD-10-CM | POA: Diagnosis present

## 2017-03-16 DIAGNOSIS — Z79899 Other long term (current) drug therapy: Secondary | ICD-10-CM | POA: Diagnosis not present

## 2017-03-16 LAB — CBC
HCT: 38.2 % — ABNORMAL LOW (ref 40.0–52.0)
Hemoglobin: 12.2 g/dL — ABNORMAL LOW (ref 13.0–18.0)
MCH: 23.9 pg — AB (ref 26.0–34.0)
MCHC: 32 g/dL (ref 32.0–36.0)
MCV: 74.7 fL — ABNORMAL LOW (ref 80.0–100.0)
PLATELETS: 307 10*3/uL (ref 150–440)
RBC: 5.11 MIL/uL (ref 4.40–5.90)
RDW: 17.7 % — AB (ref 11.5–14.5)
WBC: 6.4 10*3/uL (ref 3.8–10.6)

## 2017-03-16 LAB — COMPREHENSIVE METABOLIC PANEL
ALBUMIN: 3.8 g/dL (ref 3.5–5.0)
ALK PHOS: 41 U/L (ref 38–126)
ALT: 38 U/L (ref 17–63)
ANION GAP: 9 (ref 5–15)
AST: 31 U/L (ref 15–41)
BILIRUBIN TOTAL: 0.9 mg/dL (ref 0.3–1.2)
BUN: 14 mg/dL (ref 6–20)
CALCIUM: 9.6 mg/dL (ref 8.9–10.3)
CO2: 24 mmol/L (ref 22–32)
CREATININE: 1.04 mg/dL (ref 0.61–1.24)
Chloride: 105 mmol/L (ref 101–111)
GFR calc Af Amer: >60 mL/min (ref >60)
GFR calc non Af Amer: >60 mL/min (ref >60)
GLUCOSE: 122 mg/dL — AB (ref 65–99)
Potassium: 3.9 mmol/L (ref 3.5–5.1)
Sodium: 138 mmol/L (ref 135–145)
TOTAL PROTEIN: 6.5 g/dL (ref 6.5–8.1)

## 2017-03-16 LAB — ETHANOL: Alcohol, Ethyl (B): <10 mg/dL (ref <10)

## 2017-03-16 NOTE — ED Provider Notes (Signed)
[facility] Emergency Department Provider Note  ____________________________________________  Time seen: Approximately 8:45 PM  I have reviewed the triage vital signs and the nursing notes.   HISTORY  Chief Complaint Agitation    HPI John Copeland is a 26 y.o. male sent to the ED for evaluation after an argument at his group home. This reported that the patient got into a verbal argument with another resident, police were called and then transported the patient here for evaluation. Patient reports he's been compliant with all his medications. He received his oral Haldol dose just before coming here to the ER tonight. He reports that he and the other resident did not lay hands on each other and he denies any injuries or pain. He just reports that they raise their voices. He feels calm her now and feels back to normal and would like to go home. He wants to go to church in the morning. He feels safe at the group home. He denies any SI HI or hallucinations.  Sheriff reports that they attempted to call the patient's legal guardian, who did not answer or return their calls.   Past Medical History:  Diagnosis Date  . Anxiety   . Depression   . Diabetes mellitus without complication (HCC)   . Schizophrenia [facility])      Patient Active Problem List   Diagnosis Date Noted  . 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 Diphenhydramine overdose of undetermined intent 01/10/2017  . Tobacco use disorder 10/07/2016  . Diabetes (HCC) 10/07/2016  . HTN (hypertension) 10/07/2016  . Dyslipidemia 10/07/2016  . Schizoaffective disorder, bipolar type (HCC) 11/30/2015     History reviewed. No pertinent surgical history.   Prior to Admission medications   Medication Sig Start Date End Date Taking? Authorizing Provider  atorvastatin (LIPITOR) 10 MG tablet Take 1 tablet (10 mg total) by mouth daily at 6 PM. 01/21/17   Pucilowska, Jolanta B, MD  benztropine (COGENTIN) 1 MG tablet Take 1  tablet (1 mg total) by mouth daily. 01/21/17   Pucilowska, Jolanta B, MD  diphenhydrAMINE (BENADRYL) 50 MG capsule Take 1 capsule (50 mg total) by mouth at bedtime. 10/12/16   Jimmy Footman, MD  divalproex (DEPAKOTE ER) 250 MG 24 hr tablet Take 3 tablets (750 mg total) by mouth 2 (two) times daily at 8 am and 10 pm. 01/20/17   Pucilowska, Jolanta B, MD  fluvoxaMINE (LUVOX) 100 MG tablet Take 1 tablet (100 mg total) by mouth at bedtime. 01/20/17   Pucilowska, Jolanta B, MD  haloperidol (HALDOL) 10 MG tablet Take 1 tablet (10 mg total) by mouth 2 (two) times daily. 01/21/17   Pucilowska, Jolanta B, MD  lisinopril (PRINIVIL,ZESTRIL) 10 MG tablet Take 1 tablet (10 mg total) by mouth daily. 01/21/17   Pucilowska, Braulio Conte B, MD  metFORMIN (GLUCOPHAGE) 1000 MG tablet Take 1 tablet (1,000 mg total) by mouth 2 (two) times daily with a meal. 01/21/17   Pucilowska, Jolanta B, MD  Multiple Vitamin (MULTIVITAMIN WITH MINERALS) TABS tablet Take 1 tablet by mouth daily. 01/20/17   Pucilowska, Braulio Conte B, MD  traZODone (DESYREL) 100 MG tablet Take 1 tablet (100 mg total) by mouth at bedtime. 01/20/17   Pucilowska, Ellin Goodie, MD     Allergies Patient has no known allergies.   No family history on file.  Social History Social History  Substance Use Topics  . Smoking status: Current Every Day Smoker    Packs/day: 0.50    Types: Cigarettes  . Smokeless tobacco: Never  Used  . Alcohol use No    Review of Systems  Constitutional:   No fever or chills.  ENT:   No sore throat. No rhinorrhea. Cardiovascular:   No chest pain or syncope. Respiratory:   No dyspnea or cough. Gastrointestinal:   Negative for abdominal pain, vomiting and diarrhea.  Musculoskeletal:   Negative for focal pain or swelling All other systems reviewed and are negative except as documented above in ROS and HPI.  ____________________________________________   PHYSICAL EXAM:  VITAL SIGNS: ED Triage Vitals  Enc Vitals Group     BP  03/16/17 1950 (!) 154/110     Pulse Rate 03/16/17 1950 (!) 106     Resp 03/16/17 1950 18     Temp 03/16/17 1950 98.1 F (36.7 C)     Temp Source 03/16/17 1950 Oral     SpO2 03/16/17 1950 100 %     Weight 03/16/17 1950 185 lb (83.9 kg)     Height 03/16/17 1950 6' (1.829 m)     Head Circumference --      Peak Flow --      Pain Score 03/16/17 1949 0     Pain Loc --      Pain Edu? --      Excl. in GC? --     Vital signs reviewed, nursing assessments reviewed.   Constitutional:   Alert and oriented. Well appearing and in no distress. Quickly eating an entire lunch box Eyes:   No scleral icterus.  EOMI.  ENT   Head:   Normocephalic and atraumatic.   Nose:   No congestion/rhinnorhea. No epistaxis   Mouth/Throat:   MMM, no pharyngeal erythema.  No oral injuries   Neck:   No meningismus. Full ROM. No midline tenderness Hematological/Lymphatic/Immunilogical:   No cervical lymphadenopathy. Cardiovascular:   RRR. Symmetric bilateral radial and DP pulses.  No murmurs.  Respiratory:   Normal respiratory effort without tachypnea/retractions. Breath sounds are clear and equal bilaterally. No wheezes/rales/rhonchi. Gastrointestinal:   Soft and nontender. Non distended.   No rebound, rigidity, or guarding. Genitourinary:   deferred Musculoskeletal:   Normal range of motion in all extremities. No joint effusions.  No lower extremity tenderness.  No edema. No bony tenderness or evidence of injuries Neurologic:   Normal speech and language. Linear thought, goal-directed, oriented 3, readily provides the name and phone number of his legal guardian. No disorders of thought content or process Motor grossly intact. No gross focal neurologic deficits are appreciated.  Skin:    Skin is warm, dry and intact. No rash noted.  No petechiae, purpura, or bullae.  ____________________________________________    LABS (pertinent positives/negatives) (all labs ordered are listed, but only abnormal  results are displayed) Labs Reviewed  CBC - Abnormal; Notable for the following:       Result Value   Hemoglobin 12.2 (*)    HCT 38.2 (*)    MCV 74.7 (*)    MCH 23.9 (*)    RDW 17.7 (*)    All other components within normal limits  ETHANOL  COMPREHENSIVE METABOLIC PANEL  URINE DRUG SCREEN, QUALITATIVE ([facility] ONLY)   ____________________________________________   EKG    ____________________________________________    RADIOLOGY  No results found.  ____________________________________________   PROCEDURES Procedures  ____________________________________________     CLINICAL IMPRESSION / ASSESSMENT AND PLAN / ED COURSE  Pertinent labs & imaging results that were available during my care of the patient were reviewed by me and considered in my medical  decision making (see chart for details).   Patient well appearing no acute distress, unremarkable vital signs, calm and comfortable in the ED. He was transported to the ED due to an argument with another resident at his group home that involves raised voices. He has absolutely no symptoms, and based on this it seems inappropriate that he was sent to the ED at all. He has actually no complaints and appears to be at his baseline, medically and psychiatrically stable. He requests a shot of Haldol, but since he just received his nighttime dose, I don't want to risk giving him too much medicine, and his mental status is perfectly appropriate at the present time. I'll offer him a small dose of oral Ativan if it'll help calm his nerves given the recent confrontation, but the patient is stable for discharge home at this time.   Multiple attempts have been made to contact the patient's guardian who is not answering calls or returning calls. No interventions or procedures are planned that would require interested party consent, so it is appropriate at this time to discharge the patient back to his group home without waiting for the  guardian to call us back which may take a few days since they are reportedly a government employee and likely do not work the weekend.      ____________________________________________   FINAL CLINICAL IMPRESSION(S) / ED DIAGNOSES    Final diagnoses:  Anger reaction      New Prescriptions   No medications on file     Portions of this note were generated with dragon dictation software. Dictation errors may occur despite best attempts at proofreading.    Sharman CheekStafford, Agustus Mane, MD 03/16/17 2100

## 2017-03-16 NOTE — ED Notes (Signed)
Per nursing supervisor, d/t inability to contact legal guardian and patient history of not returning to group home, the patient will need to personally be escorted by group home facility, law enforcement or legal guardian.

## 2017-03-16 NOTE — ED Notes (Signed)
Hydrographic surveyorheriff Deputy Crisis manager here to escort patient back to [facility] for [facility].  Patient agreeable at this time. Patient legal guardian called in attempt to inform him of patient's discharge.

## 2017-03-16 NOTE — ED Notes (Signed)
This RN tried to make contact with a ride to pick pt up upon discharge from [facility]. Called the main number and they referred me to an administrator, Curly ShoresLawanda Raye [phone], and stated she was the one to organize said ride. LVM at this time with Lawanda to call back ASAP.

## 2017-03-16 NOTE — ED Notes (Signed)
Triage note and information has been obtained by this RN (not RN Rosey Batheresa).  Pt changed out in triage observed by officer Kirby BingHays , pt belongings consist on white /tan pants , white t-shirt, white over shirt, a black stretchy arm band(Jesus) , black covered bible/ black composition book

## 2017-03-16 NOTE — ED Triage Notes (Signed)
Pt arrived with officer [place] BingHays [facility]([place] Sheriff), pt resides at [facility], having an argument with another resident . Pt requesting a shot " Haldol". Pt here voluntarily calm and cooperative.

## 2017-03-16 NOTE — ED Triage Notes (Signed)
Officer Sawyer BingHays reported circumstances to APS pta

## 2017-03-16 NOTE — ED Notes (Signed)
Per officer the patient is supposed to be taking medications per facility :  benzatropine 1 mg QD, atorvastatin 10 mg QD, therems multivitamin, divalproex ER 250 TID, haldol 10 mg 1 and 1/2 tab QD, metformin 1000 mg QD, benadryl 50 mg bedtime, trazodone 100 mg at bedtime.

## 2017-03-16 NOTE — ED Notes (Addendum)
(  [phone], Legal Guardian after hours, called and informed that the patient is here being treated.  After Hours coordinator stated that she has sent an e-mail to his legal guardian Wille CelesteMarty P. Voicemail was also left on Marty's voicemail for a return call to the hospital in regards to a mutual patient.

## 2017-03-16 NOTE — ED Notes (Signed)
John Copeland returned phone call and stated she could not come pick John Copeland up because she had taken her nightly medication and thus could not drive. She stated there would be someone at the facility to pay a cab if we call one for John Copeland to bring John Copeland back home.

## 2017-03-27 ENCOUNTER — Other Ambulatory Visit: Payer: Self-pay

## 2017-03-27 ENCOUNTER — Emergency Department
Admission: EM | Admit: 2017-03-27 | Discharge: 2017-03-29 | Disposition: A | Discharge status: Other Acute Inpatient Hospital | Payer: Medicaid Other | Attending: Internal Medicine | Admitting: Internal Medicine

## 2017-03-27 DIAGNOSIS — E119 Type 2 diabetes mellitus without complications: Secondary | ICD-10-CM | POA: Diagnosis not present

## 2017-03-27 DIAGNOSIS — I10 Essential (primary) hypertension: Secondary | ICD-10-CM | POA: Diagnosis not present

## 2017-03-27 DIAGNOSIS — R4689 Other symptoms and signs involving appearance and behavior: Secondary | ICD-10-CM

## 2017-03-27 DIAGNOSIS — F17210 Nicotine dependence, cigarettes, uncomplicated: Secondary | ICD-10-CM | POA: Diagnosis not present

## 2017-03-27 DIAGNOSIS — E785 Hyperlipidemia, unspecified: Secondary | ICD-10-CM | POA: Diagnosis present

## 2017-03-27 DIAGNOSIS — F250 Schizoaffective disorder, bipolar type: Secondary | ICD-10-CM | POA: Diagnosis present

## 2017-03-27 LAB — CBC
HEMATOCRIT: 37.3 % — AB (ref 40.0–52.0)
Hemoglobin: 11.8 g/dL — ABNORMAL LOW (ref 13.0–18.0)
MCH: 23.6 pg — AB (ref 26.0–34.0)
MCHC: 31.7 g/dL — ABNORMAL LOW (ref 32.0–36.0)
MCV: 74.3 fL — AB (ref 80.0–100.0)
PLATELETS: 305 10*3/uL (ref 150–440)
RBC: 5.02 MIL/uL (ref 4.40–5.90)
RDW: 17.3 % — AB (ref 11.5–14.5)
WBC: 8.3 10*3/uL (ref 3.8–10.6)

## 2017-03-27 LAB — COMPREHENSIVE METABOLIC PANEL
ALBUMIN: 4.1 g/dL (ref 3.5–5.0)
ALK PHOS: 55 U/L (ref 38–126)
ALT: 38 U/L (ref 17–63)
AST: 46 U/L — AB (ref 15–41)
Anion gap: 8 (ref 5–15)
BILIRUBIN TOTAL: 0.6 mg/dL (ref 0.3–1.2)
BUN: 8 mg/dL (ref 6–20)
CALCIUM: 9.5 mg/dL (ref 8.9–10.3)
CO2: 27 mmol/L (ref 22–32)
CREATININE: 0.86 mg/dL (ref 0.61–1.24)
Chloride: 105 mmol/L (ref 101–111)
GFR calc Af Amer: >60 mL/min (ref >60)
GLUCOSE: 83 mg/dL (ref 65–99)
Potassium: 3.7 mmol/L (ref 3.5–5.1)
Sodium: 140 mmol/L (ref 135–145)
TOTAL PROTEIN: 7.3 g/dL (ref 6.5–8.1)

## 2017-03-27 LAB — SALICYLATE LEVEL: Salicylate Lvl: <7 mg/dL (ref 2.8–30.0)

## 2017-03-27 LAB — ACETAMINOPHEN LEVEL: Acetaminophen (Tylenol), Serum: <10 ug/mL — ABNORMAL LOW (ref 10–30)

## 2017-03-27 LAB — ETHANOL

## 2017-03-27 MED ORDER — BENZTROPINE MESYLATE 1 MG PO TABS
1.0000 mg | ORAL_TABLET | Freq: Two times a day (BID) | ORAL | Status: DC
  Administered 2017-03-27 – 2017-03-29 (×4): 1 mg via ORAL
  Filled 2017-03-27 (×4): qty 1

## 2017-03-27 MED ORDER — TRAZODONE HCL 100 MG PO TABS
100.0000 mg | ORAL_TABLET | Freq: Every day | ORAL | Status: DC
  Administered 2017-03-27 – 2017-03-28 (×2): 100 mg via ORAL
  Filled 2017-03-27 (×2): qty 1

## 2017-03-27 MED ORDER — FLUVOXAMINE MALEATE 50 MG PO TABS
100.0000 mg | ORAL_TABLET | Freq: Every day | ORAL | Status: DC
  Administered 2017-03-27 – 2017-03-28 (×2): 100 mg via ORAL
  Filled 2017-03-27: qty 2
  Filled 2017-03-27: qty 1

## 2017-03-27 MED ORDER — LISINOPRIL 5 MG PO TABS
10.0000 mg | ORAL_TABLET | Freq: Every day | ORAL | Status: DC
  Administered 2017-03-28: 10 mg via ORAL
  Filled 2017-03-27 (×2): qty 2

## 2017-03-27 MED ORDER — METFORMIN HCL 500 MG PO TABS
1000.0000 mg | ORAL_TABLET | Freq: Two times a day (BID) | ORAL | Status: DC
  Administered 2017-03-28 – 2017-03-29 (×3): 1000 mg via ORAL
  Filled 2017-03-27 (×2): qty 2

## 2017-03-27 MED ORDER — DIVALPROEX SODIUM 500 MG PO DR TAB
750.0000 mg | DELAYED_RELEASE_TABLET | Freq: Two times a day (BID) | ORAL | Status: DC
  Administered 2017-03-27 – 2017-03-29 (×4): 750 mg via ORAL
  Filled 2017-03-27 (×4): qty 1

## 2017-03-27 MED ORDER — ATORVASTATIN CALCIUM 10 MG PO TABS
10.0000 mg | ORAL_TABLET | Freq: Every day | ORAL | Status: DC
  Administered 2017-03-28: 10 mg via ORAL
  Filled 2017-03-27: qty 1

## 2017-03-27 MED ORDER — HALOPERIDOL 5 MG PO TABS
10.0000 mg | ORAL_TABLET | Freq: Two times a day (BID) | ORAL | Status: DC
  Administered 2017-03-27 – 2017-03-29 (×4): 10 mg via ORAL
  Filled 2017-03-27 (×4): qty 2
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
## 2017-03-27 NOTE — ED Notes (Signed)
IVC/Consult completed/ Pending Disposition 
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
## 2017-03-27 NOTE — ED Notes (Signed)
BEHAVIORAL HEALTH ROUNDING Patient sleeping: No. Patient alert and oriented: yes Behavior appropriate: Yes.  ; If no, describe:  Nutrition and fluids offered: yes Toileting and hygiene offered: Yes  Sitter present: q15 minute observations and security  monitoring Law enforcement present: Yes  ODS  

## 2017-03-27 NOTE — ED Notes (Signed)
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
## 2017-03-27 NOTE — ED Notes (Signed)
Pt to be moved to B[facility] at this time. Awaiting officer escort.
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
## 2017-03-27 NOTE — ED Notes (Signed)
Group Home contact: Sharman CrateLaurie Arena [phone]
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
## 2017-03-27 NOTE — ED Notes (Signed)
BEHAVIORAL HEALTH ROUNDING Patient sleeping: Yes.   Patient alert and oriented: eyes closed  Appears to be asleep Behavior appropriate: Yes.  ; If no, describe:  Nutrition and fluids offered: Yes  Toileting and hygiene offered: sleeping Sitter present: q 15 minute observations and security monitoring Law enforcement present: yes  ODS 

## 2017-03-28 DIAGNOSIS — F250 Schizoaffective disorder, bipolar type: Secondary | ICD-10-CM | POA: Diagnosis not present

## 2017-03-28 NOTE — ED Notes (Signed)
Called BMU spoke with charge RN about possible time to transfer patient. Charge RN states patient will not be admitted to unit tonight due to staffing.

## 2017-03-28 NOTE — ED Provider Notes (Signed)
-----------------------------------------   7:12 AM on 03/28/2017 -----------------------------------------   Blood pressure (!) 141/68, pulse (!) 55, temperature 97.6 F (36.4 C), temperature source Oral, resp. rate 16, SpO2 100 %.  The patient had no acute events since last update.  Calm and cooperative at this time.  Disposition is pending Psychiatry/Behavioral Medicine team recommendations.     Sharman CheekStafford, Anwyn Kriegel, MD 03/28/17 [phone]

## 2017-03-28 NOTE — ED Notes (Signed)
Pt is alert and oriented on admission. Pt is somewhat guarded and forwards little to staff, but he did take medications as prescribed. Food and drink provided and 15 minute checks are ongoing for safety. Pt denies SI/HI and AVH at this time and went to sleep right after arriving on the unit.

## 2017-03-28 NOTE — Consult Note (Signed)
[facility] Consult   Reason for Consult: Follow-up consult 26 year old man in the emergency room with schizoaffective disorder. Referring Physician: Jimmye Norman Patient Identification: John Copeland MRN:  [id number] Principal Diagnosis: Schizoaffective disorder, bipolar type [facility]) Diagnosis:   Patient Active Problem List   Diagnosis Date Noted  . Polydipsia [R63.1] 01/12/2017  . Diphenhydramine overdose of undetermined intent [T45.0X4A] 01/10/2017  . Tobacco use disorder [F17.200] 10/07/2016  . Diabetes ([place]) [E11.9] 10/07/2016  . HTN (hypertension) [I10] 10/07/2016  . Dyslipidemia [E78.5] 10/07/2016  . Schizoaffective disorder, bipolar type ([place]) [F25.0] 11/30/2015    Total Time spent with patient: 20 minutes  Subjective:   John Copeland is a 26 y.o. male patient admitted with "I am fine, sir".  HPI: Patient interviewed chart reviewed.  Spoke with TTS and nursing today.  This 26 year old man with schizophrenia or schizoaffective disorder has remained withdrawn.  When I went to see him today he was once again curled up in a fetal position and had to be coaxed out to have any conversation.  He hardly moves during the conversation.  Patient's affect is flat and seems paranoid.  Nursing tells me he had intermittently been refusing his medicines at times.  Patient is now stating that he wants to go back home.  Very adamant and argumentative about it.  Clearly still not at his baseline and refusing medicine indicates a unlikelihood that he will function outside the hospital.  Past Psychiatric History: History of schizophrenia or schizoaffective disorder.  Several presentations to hospitals.  He tends to get both manic and withdrawn at times.  Has a history of noncompliance due to poor insight  Risk to Self: Suicidal Ideation: No Suicidal Intent: No Is patient at risk for suicide?: No Suicidal Plan?: No Access to Means: No What has been your use of drugs/alcohol  within the last 12 months?: Reports of none How many times?: 0 Other Self Harm Risks: Reports of none Triggers for Past Attempts: None known Intentional Self Injurious Behavior: None Risk to Others: Homicidal Ideation: No Thoughts of Harm to Others: No Current Homicidal Intent: No Current Homicidal Plan: No Access to Homicidal Means: No Identified Victim: Reports of none History of harm to others?: No Assessment of Violence: None Noted Violent Behavior Description: Reports of none Does patient have access to weapons?: No Criminal Charges Pending?: No Does patient have a court date: No Prior Inpatient Therapy: Prior Inpatient Therapy: Yes Prior Therapy Dates: 01/2017 & 09/2016 Prior Therapy Facilty/Provider(s): [facility] Reason for Treatment: Psychosis Prior Outpatient Therapy: Prior Outpatient Therapy: Yes Prior Therapy Dates: Current Prior Therapy Facilty/Provider(s): [facility] Reason for Treatment: Psychosis Does patient have an ACCT team?: No Does patient have Intensive In-House Services?  : No Does patient have [facility] services? : No Does patient have P4CC services?: No  Past Medical History:  Past Medical History:  Diagnosis Date  . Anxiety   . Depression   . Diabetes mellitus without complication ([place])   . Schizophrenia ([place])    History reviewed. No pertinent surgical history. Family History: No family history on file. Family Psychiatric  History: None known Social History:  Social History   Substance and Sexual Activity  Alcohol Use No     Social History   Substance and Sexual Activity  Drug Use No    Social History   Socioeconomic History  . Marital status: Single    Spouse name: None  . Number of children: None  . Years of education: None  . Highest education  level: None  Social Needs  . Financial resource strain: None  . Food insecurity - worry: None  . Food insecurity - inability: None  . Transportation needs - medical: None  . Transportation  needs - non-medical: None  Occupational History  . None  Tobacco Use  . Smoking status: Current Every Day Smoker    Packs/day: 0.50    Types: Cigarettes  . Smokeless tobacco: Never Used  Substance and Sexual Activity  . Alcohol use: No  . Drug use: No  . Sexual activity: None  Other Topics Concern  . None  Social History Narrative  . None   Additional Social History:    Allergies:  No Known Allergies  Labs:  Results for orders placed or performed during the hospital encounter of 03/27/17 (from the past 48 hour(s))  Comprehensive metabolic panel     Status: Abnormal   Collection Time: 03/27/17  9:50 AM  Result Value Ref Range   Sodium 140 135 - 145 mmol/L   Potassium 3.7 3.5 - 5.1 mmol/L   Chloride 105 101 - 111 mmol/L   CO2 27 22 - 32 mmol/L   Glucose, Bld 83 65 - 99 mg/dL   BUN 8 6 - 20 mg/dL   Creatinine, Ser 0.86 0.61 - 1.24 mg/dL   Calcium 9.5 8.9 - 10.3 mg/dL   Total Protein 7.3 6.5 - 8.1 g/dL   Albumin 4.1 3.5 - 5.0 g/dL   AST 46 (H) 15 - 41 U/L   ALT 38 17 - 63 U/L   Alkaline Phosphatase 55 38 - 126 U/L   Total Bilirubin 0.6 0.3 - 1.2 mg/dL   GFR calc non Af Amer >60 >60 mL/min   GFR calc Af Amer >60 >60 mL/min    Comment: (NOTE) The eGFR has been calculated using the CKD EPI equation. This calculation has not been validated in all clinical situations. eGFR's persistently <60 mL/min signify possible Chronic Kidney Disease.    Anion gap 8 5 - 15  Ethanol     Status: None   Collection Time: 03/27/17  9:50 AM  Result Value Ref Range   Alcohol, Ethyl (B) <10 <10 mg/dL    Comment:        LOWEST DETECTABLE LIMIT FOR SERUM ALCOHOL IS 10 mg/dL FOR MEDICAL PURPOSES ONLY   Salicylate level     Status: None   Collection Time: 03/27/17  9:50 AM  Result Value Ref Range   Salicylate Lvl <2.3 2.8 - 30.0 mg/dL  Acetaminophen level     Status: Abnormal   Collection Time: 03/27/17  9:50 AM  Result Value Ref Range   Acetaminophen (Tylenol), Serum <10 (L) 10 - 30  ug/mL    Comment:        THERAPEUTIC CONCENTRATIONS VARY SIGNIFICANTLY. A RANGE OF 10-30 ug/mL MAY BE AN EFFECTIVE CONCENTRATION FOR MANY PATIENTS. HOWEVER, SOME ARE BEST TREATED AT CONCENTRATIONS OUTSIDE THIS RANGE. ACETAMINOPHEN CONCENTRATIONS >150 ug/mL AT 4 HOURS AFTER INGESTION AND >50 ug/mL AT 12 HOURS AFTER INGESTION ARE OFTEN ASSOCIATED WITH TOXIC REACTIONS.   cbc     Status: Abnormal   Collection Time: 03/27/17  9:50 AM  Result Value Ref Range   WBC 8.3 3.8 - 10.6 K/uL   RBC 5.02 4.40 - 5.90 MIL/uL   Hemoglobin 11.8 (L) 13.0 - 18.0 g/dL   HCT 37.3 (L) 40.0 - 52.0 %   MCV 74.3 (L) 80.0 - 100.0 fL   MCH 23.6 (L) 26.0 - 34.0 pg   MCHC  31.7 (L) 32.0 - 36.0 g/dL   RDW 17.3 (H) 11.5 - 14.5 %   Platelets 305 150 - 440 K/uL    Current Facility-Administered Medications  Medication Dose Route Frequency Provider Last Rate Last Dose  . atorvastatin (LIPITOR) tablet 10 mg  10 mg Oral q1800 Clapacs, Madie Reno, MD   10 mg at 03/28/17 1709  . benztropine (COGENTIN) tablet 1 mg  1 mg Oral BID Clapacs, Madie Reno, MD   1 mg at 03/28/17 1134  . divalproex (DEPAKOTE) DR tablet 750 mg  750 mg Oral Q12H Clapacs, John T, MD   750 mg at 03/28/17 1715  . fluvoxaMINE (LUVOX) tablet 100 mg  100 mg Oral QHS Clapacs, John T, MD   100 mg at 03/27/17 2112  . haloperidol (HALDOL) tablet 10 mg  10 mg Oral BID Clapacs, Madie Reno, MD   10 mg at 03/28/17 1134  . lisinopril (PRINIVIL,ZESTRIL) tablet 10 mg  10 mg Oral Daily Clapacs, Madie Reno, MD   10 mg at 03/28/17 1134  . metFORMIN (GLUCOPHAGE) tablet 1,000 mg  1,000 mg Oral BID WC Clapacs, John T, MD   1,000 mg at 03/28/17 1710  . traZODone (DESYREL) tablet 100 mg  100 mg Oral QHS Clapacs, Madie Reno, MD   100 mg at 03/27/17 2112   Current Outpatient Medications  Medication Sig Dispense Refill  . atorvastatin (LIPITOR) 10 MG tablet Take 1 tablet (10 mg total) by mouth daily at 6 PM. 30 tablet 1  . benztropine (COGENTIN) 1 MG tablet Take 1 tablet (1 mg total) by  mouth daily. 30 tablet 1  . diphenhydrAMINE (BENADRYL) 50 MG capsule Take 1 capsule (50 mg total) by mouth at bedtime. 30 capsule 0  . divalproex (DEPAKOTE ER) 250 MG 24 hr tablet Take 3 tablets (750 mg total) by mouth 2 (two) times daily at 8 am and 10 pm. 180 tablet 1  . fluvoxaMINE (LUVOX) 100 MG tablet Take 1 tablet (100 mg total) by mouth at bedtime. 30 tablet 1  . haloperidol (HALDOL) 10 MG tablet Take 1 tablet (10 mg total) by mouth 2 (two) times daily. 60 tablet 1  . lisinopril (PRINIVIL,ZESTRIL) 10 MG tablet Take 1 tablet (10 mg total) by mouth daily. 30 tablet 1  . metFORMIN (GLUCOPHAGE) 1000 MG tablet Take 1 tablet (1,000 mg total) by mouth 2 (two) times daily with a meal. 60 tablet 1  . Multiple Vitamin (MULTIVITAMIN WITH MINERALS) TABS tablet Take 1 tablet by mouth daily. 30 tablet 1  . traZODone (DESYREL) 100 MG tablet Take 1 tablet (100 mg total) by mouth at bedtime. 30 tablet 0    Musculoskeletal: Strength & Muscle Tone: within normal limits Gait & Station: normal Patient leans: N/A  Psychiatric Specialty Exam: Physical Exam  Constitutional: He appears well-developed and well-nourished.  HENT:  Head: Normocephalic and atraumatic.  Eyes: Conjunctivae are normal. Pupils are equal, round, and reactive to light.  Neck: Normal range of motion.  Cardiovascular: Normal heart sounds.  Respiratory: Effort normal.  GI: Soft.  Musculoskeletal: Normal range of motion.  Neurological: He is alert.  Skin: Skin is warm and dry.  Psychiatric: His mood appears anxious. His affect is blunt. His speech is delayed. He is withdrawn. Thought content is paranoid. Cognition and memory are impaired. He expresses impulsivity. He exhibits a depressed mood. He expresses no suicidal ideation.    Review of Systems  Constitutional: Negative.   HENT: Negative.   Eyes: Negative.   Respiratory: Negative.  Cardiovascular: Negative.   Gastrointestinal: Negative.   Musculoskeletal: Negative.    Skin: Negative.   Neurological: Negative.   Psychiatric/Behavioral: Negative.     Blood pressure (P) 133/65, pulse (P) 63, temperature 97.6 F (36.4 C), temperature source Oral, resp. rate (P) 18, SpO2 (P) 100 %.There is no height or weight on file to calculate BMI.  General Appearance: Casual  Eye Contact:  Fair  Speech:  Loud and very concrete when he speaks  Volume:  Normal  Mood:  Irritable  Affect:  Inappropriate  Thought Process:  Disorganized  Orientation:  Negative  Thought Content:  Illogical and Paranoid Ideation  Suicidal Thoughts:  No  Homicidal Thoughts:  No  Memory:  Immediate;   Fair Recent;   Fair Remote;   Fair  Judgement:  Impaired  Insight:  Shallow  Psychomotor Activity:  Decreased  Concentration:  Concentration: Poor  Recall:  Poor  Fund of Knowledge:  Fair  Language:  Fair  Akathisia:  No  Handed:  Right  AIMS (if indicated):     Assets:  Physical Health  ADL's:  Impaired  Cognition:  Impaired,  Mild  Sleep:        Treatment Plan Summary: Daily contact with patient to assess and evaluate symptoms and progress in treatment, Medication management and Plan 26 year old man who remains very withdrawn with inappropriate bizarre behavior.  Not compliant with medicine.  Does not appear to have good insight or be realistically able to interact with others.  I think he definitely still needs hospital level treatment.  Orders completed for admission downstairs.  Case reviewed with nursing and TTS.  Encourage patient to be compliant with medicine and it looks like that is getting a little better this evening.  No change to orders otherwise.  Disposition: Recommend psychiatric Inpatient admission when medically cleared.  Alethia Berthold, MD 03/28/2017 6:42 PM

## 2017-03-28 NOTE — ED Notes (Signed)
Patient is sleeping, He does arouse to verbal stimuli, but does not want to engage in conversation, he is guarded and wants to be left alone, q 15 minute checks and camera surveillance in progress for safety.

## 2017-03-28 NOTE — ED Notes (Signed)
Patient did not eat lunch, did drink a milk. Patient is calm, just states " I want to go back to group home, He denies Si/hi or avh, q 15 minute checks, and camera surveillance in progress for safety.

## 2017-03-28 NOTE — ED Notes (Signed)
Patient took evening medications and agreed to take the depakote that he missed this am, Patient has been talking more and has better eye contact, patient asking for milk., nurse administered. Patient is safe, denies Si/hi or avh.

## 2017-03-28 NOTE — ED Notes (Signed)
Nurse in room with Patient and when asked if He would take His medication He said " yes' but when nurse took medications in to him with coke, He said" no and started crying and states " medications are making me not able to move, I can't take them , just go away" . Nurse did try to talk to him about the importance of His medications, but He refused. Patient is safe, will continue to monitor.

## 2017-03-29 ENCOUNTER — Inpatient Hospital Stay
Admission: AD | Admit: 2017-03-29 | Discharge: 2017-04-10 | DRG: 885 | Disposition: A | Discharge status: Home / Self Care | Payer: Medicaid Other | Attending: Psychiatry | Admitting: Psychiatry

## 2017-03-29 ENCOUNTER — Other Ambulatory Visit: Payer: Self-pay

## 2017-03-29 DIAGNOSIS — F17210 Nicotine dependence, cigarettes, uncomplicated: Secondary | ICD-10-CM | POA: Diagnosis present

## 2017-03-29 DIAGNOSIS — F209 Schizophrenia, unspecified: Secondary | ICD-10-CM | POA: Insufficient documentation

## 2017-03-29 DIAGNOSIS — I10 Essential (primary) hypertension: Secondary | ICD-10-CM | POA: Diagnosis present

## 2017-03-29 DIAGNOSIS — Z5181 Encounter for therapeutic drug level monitoring: Secondary | ICD-10-CM | POA: Diagnosis not present

## 2017-03-29 DIAGNOSIS — G4700 Insomnia, unspecified: Secondary | ICD-10-CM | POA: Diagnosis present

## 2017-03-29 DIAGNOSIS — Z9114 Patient's other noncompliance with medication regimen: Secondary | ICD-10-CM | POA: Diagnosis not present

## 2017-03-29 DIAGNOSIS — Z79899 Other long term (current) drug therapy: Secondary | ICD-10-CM | POA: Diagnosis not present

## 2017-03-29 DIAGNOSIS — F250 Schizoaffective disorder, bipolar type: Principal | ICD-10-CM | POA: Diagnosis present

## 2017-03-29 DIAGNOSIS — Z7984 Long term (current) use of oral hypoglycemic drugs: Secondary | ICD-10-CM | POA: Diagnosis not present

## 2017-03-29 DIAGNOSIS — E785 Hyperlipidemia, unspecified: Secondary | ICD-10-CM | POA: Diagnosis present

## 2017-03-29 DIAGNOSIS — A150 Tuberculosis of lung: Secondary | ICD-10-CM

## 2017-03-29 DIAGNOSIS — F17200 Nicotine dependence, unspecified, uncomplicated: Secondary | ICD-10-CM | POA: Diagnosis present

## 2017-03-29 DIAGNOSIS — K5900 Constipation, unspecified: Secondary | ICD-10-CM | POA: Diagnosis present

## 2017-03-29 DIAGNOSIS — E119 Type 2 diabetes mellitus without complications: Secondary | ICD-10-CM | POA: Diagnosis present

## 2017-03-29 MED ORDER — BENZTROPINE MESYLATE 1 MG PO TABS
1.0000 mg | ORAL_TABLET | Freq: Two times a day (BID) | ORAL | Status: DC
  Administered 2017-03-29 – 2017-04-10 (×19): 1 mg via ORAL
  Filled 2017-03-29 (×19): qty 1

## 2017-03-29 MED ORDER — LISINOPRIL 20 MG PO TABS
10.0000 mg | ORAL_TABLET | Freq: Every day | ORAL | Status: DC
  Administered 2017-03-30 – 2017-04-10 (×8): 10 mg via ORAL
  Filled 2017-03-29 (×4): qty 1
  Filled 2017-03-29: qty 0.5
  Filled 2017-03-29 (×6): qty 1

## 2017-03-29 MED ORDER — MAGNESIUM HYDROXIDE 400 MG/5ML PO SUSP: 30.0000 mL | Freq: Every day | ORAL | Status: DC | PRN

## 2017-03-29 MED ORDER — HALOPERIDOL 5 MG PO TABS
10.0000 mg | ORAL_TABLET | Freq: Two times a day (BID) | ORAL | Status: DC
  Administered 2017-03-29 – 2017-03-31 (×5): 10 mg via ORAL
  Filled 2017-03-29 (×6): qty 2

## 2017-03-29 MED ORDER — TRAZODONE HCL 100 MG PO TABS
100.0000 mg | ORAL_TABLET | Freq: Every day | ORAL | Status: DC
  Administered 2017-03-29 – 2017-04-09 (×11): 100 mg via ORAL
  Filled 2017-03-29 (×11): qty 1

## 2017-03-29 MED ORDER — ATORVASTATIN CALCIUM 20 MG PO TABS
10.0000 mg | ORAL_TABLET | Freq: Every day | ORAL | Status: DC
  Administered 2017-03-29 – 2017-04-09 (×10): 10 mg via ORAL
  Filled 2017-03-29 (×10): qty 1

## 2017-03-29 MED ORDER — ALUM & MAG HYDROXIDE-SIMETH 200-200-20 MG/5ML PO SUSP
30.0000 mL | ORAL | Status: DC | PRN
  Administered 2017-04-06: 30 mL via ORAL
  Filled 2017-03-29: qty 30

## 2017-03-29 MED ORDER — FLUVOXAMINE MALEATE 50 MG PO TABS
100.0000 mg | ORAL_TABLET | Freq: Every day | ORAL | Status: DC
  Administered 2017-03-29 – 2017-04-09 (×11): 100 mg via ORAL
  Filled 2017-03-29 (×11): qty 2

## 2017-03-29 MED ORDER — ACETAMINOPHEN 325 MG PO TABS
650.0000 mg | ORAL_TABLET | Freq: Four times a day (QID) | ORAL | Status: DC | PRN
  Filled 2017-03-29: qty 2

## 2017-03-29 MED ORDER — METFORMIN HCL 500 MG PO TABS
1000.0000 mg | ORAL_TABLET | Freq: Two times a day (BID) | ORAL | Status: DC
  Administered 2017-03-29 – 2017-04-10 (×19): 1000 mg via ORAL
  Filled 2017-03-29 (×20): qty 2

## 2017-03-29 MED ORDER — DIVALPROEX SODIUM 250 MG PO DR TAB
750.0000 mg | DELAYED_RELEASE_TABLET | Freq: Two times a day (BID) | ORAL | Status: DC
  Administered 2017-03-29 – 2017-03-31 (×5): 750 mg via ORAL
  Filled 2017-03-29 (×7): qty 3

## 2017-03-29 NOTE — Tx Team (Signed)
Initial Treatment Plan 03/29/2017 2:10 PM John Copeland [id number]    PATIENT STRESSORS: Educational concerns Medication change or noncompliance   PATIENT STRENGTHS: Average or above average intelligence Communication skills   PATIENT IDENTIFIED PROBLEMS: Noncompliant with medications 11/116/2018  Schizophrenia 03/29/2017                   DISCHARGE CRITERIA:  Ability to meet basic life and health needs Adequate post-discharge living arrangements Medical problems require only outpatient monitoring  PRELIMINARY DISCHARGE PLAN: Attend aftercare/continuing care group Return to previous living arrangement  PATIENT/FAMILY INVOLVEMENT: This treatment plan has been presented to and reviewed with the patient, John Copeland, and/or family member,   The patient and family have been given the opportunity to ask questions and make suggestions.  Leonarda SalonGigi George Breydon Senters, RN 03/29/2017, 2:10 PM

## 2017-03-29 NOTE — ED Notes (Signed)
Report called and given to Aurther Lofterry, Charity fundraiserN. All belongings will be transported with patient during d/c. Pt denies ah/vh/si/hi at this time. Pt denies pain. No s/sx of distressed noted/reported prior to d/c.

## 2017-03-29 NOTE — BHH Counselor (Addendum)
PSA attempt w guardian, Joetta MannersMarty Pruncy, [place] DSS; VM full, not back in office until Monday 11/19.  Per [place] guardianship contact [phone]) patient now has guardian through [facility], [name] [phone]).  Will attempt to reach guardian to complete PSA.  Left VM requesting call back, VM was not personally identified so CSW did not leave client name.  Did leave contact info for weekday and weekend CSWs.    Santa GeneraAnne Iyana Topor, LCSW Lead Clinical Social Worker Phone:  [phone]

## 2017-03-29 NOTE — ED Notes (Signed)
Patient refused vitals.

## 2017-03-29 NOTE — Progress Notes (Signed)
Recreation Therapy Notes  INPATIENT RECREATION THERAPY ASSESSMENT  Patient Details Name: John Copeland MRN: [id number] DOB: 03/24/1991 Today's Date: 03/29/2017   Patient refused assessment, stated "he just wants to go", and "does not know why he is here".  Patient Stressors:    Coping Skills:      Engineer, building servicesersonal Challenges:    Leisure Interests (2+):     Awareness of Community Resources:     WalgreenCommunity Resources:     Current Use:    If no, Barriers?:    Patient Strengths:     Patient Identified Areas of Improvement:     Current Recreation Participation:     Patient Goal for Hospitalization:     [place]      Current SI (including self-harm):     Current HI:     Consent to Intern Participation:     [name] 03/29/2017, 12:44 PM

## 2017-03-29 NOTE — BHH Counselor (Addendum)
Complex Case Management Note   Does the patient have a legal guardian (if yes, please add name and contact information of guardian):  Yes,  John Copeland, [facility],    Has guardianship paperwork been requested/received?: Yes,  requested updated paperwork 03/29/17   Does the patient have an IDD diagnosis?: No. per guardian has been diagnosed w a memory impairment, however no records provided   Does the patient have a care coordinator?: No.   If there is no care coordinator, has a referral been made?:  Patient has ACT team  Natural Supports (and contact information if applicable): none, no family  Outside agencies involved: [facility]  Medical needs/complications (eg. Diabetic, C-PAP, O2, ADLs): No.   Which ALF/FCHs has patient been referred to (i.e. Name of facility, contact, and date of referral):  Name of Facility Contact Information Date of referral What [facility]   Per guardian, was issued 30 day notice 11/1 and will not return at DC.  States ACT team and guardian are looking for alternative placement                   Other:  11/16:  Guardian informed that patient will need to discharge when medically stable, cannot be held for placement.  Informed guardian that record does not have diagnosis of memory impairment that substantiates need for locked facility.  Guardian requested name of facility for group living high.  CSW reminded guardian that placement in these facilities must be [facility] and is a lengthy process.  Information provided via email to guardian ([email or web address]).  PASARR is [phone] John CarwinK   John Copeland C Prerana Strayer, LCSW 03/29/2017 2:41 PM

## 2017-03-29 NOTE — Progress Notes (Signed)
Patient does not want to talk to staff at this time  But cooperative during admission assessment. Patient denies SI/HI at this time. Patient denies AVH. Patient informed of fall risk status, fall risk assessed "low" at this time. Patient oriented to unit/staff/room. Patient denies any questions/concerns at this time. Patient safe on unit with Q15 minute checks for safety.Skin assessment and body search done,no contraband found.

## 2017-03-29 NOTE — BHH Suicide Risk Assessment (Addendum)
[facility]   Nursing information obtained from:    Demographic factors:    Current Mental Status:    Loss Factors:    Historical Factors:    Risk Reduction Factors:     Total Time spent with patient: 1 hour Principal Problem: <principal problem not specified> Diagnosis:   Patient Active Problem List   Diagnosis Date Noted  . Schizophrenia (HCC) [F20.9] 03/29/2017  . Polydipsia [R63.1] 01/12/2017  . Diphenhydramine overdose of undetermined intent [T45.0X4A] 01/10/2017  . Tobacco use disorder [F17.200] 10/07/2016  . Diabetes (HCC) [E11.9] 10/07/2016  . HTN (hypertension) [I10] 10/07/2016  . Dyslipidemia [E78.5] 10/07/2016  . Schizoaffective disorder, bipolar type (HCC) [F25.0] 11/30/2015   Subjective Data: aggressive behavior  Continued Clinical Symptoms:  Alcohol Use Disorder Identification Test Final Score (AUDIT): 1 The "Alcohol Use Disorders Identification Test", Guidelines for Use in Primary Care, Second Edition.  World Science writerHealth Organization [facility](WHO). Score between 0-7:  no or low risk or alcohol related problems. Score between 8-15:  moderate risk of alcohol related problems. Score between 16-19:  high risk of alcohol related problems. Score 20 or above:  warrants further diagnostic evaluation for alcohol dependence and treatment.   CLINICAL FACTORS:   Schizophrenia:   Less than 26 years old   Musculoskeletal: Strength & Muscle Tone: within normal limits Gait & Station: normal Patient leans: N/A  Psychiatric Specialty Exam: Physical Exam  Nursing note and vitals reviewed. Psychiatric: His speech is normal. Thought content normal. His affect is blunt. He is withdrawn. Cognition and memory are normal. He expresses impulsivity.    Review of Systems  Neurological: Negative.   All other systems reviewed and are negative.   Blood pressure (!) 146/84, pulse 70, temperature 98.3 F (36.8 C), temperature source Oral, resp. rate 18, height 6'  (1.829 m), weight 78.5 kg (173 lb), SpO2 100 %.Body mass index is 23.46 kg/m.  General Appearance: Casual  Eye Contact:  Good  Speech:  Slow  Volume:  Decreased  Mood:  Euthymic  Affect:  Blunt  Thought Process:  Goal Directed and Descriptions of Associations: Intact  Orientation:  Full (Time, Place, and Person)  Thought Content:  WDL  Suicidal Thoughts:  No  Homicidal Thoughts:  No  Memory:  Immediate;   Fair Recent;   Fair Remote;   Fair  Judgement:  Poor  Insight:  Lacking  Psychomotor Activity:  Psychomotor Retardation  Concentration:  Concentration: Fair and Attention Span: Fair  Recall:  FiservFair  Fund of Knowledge:  Fair  Language:  Poor  Akathisia:  No  Handed:  Right  AIMS (if indicated):     Assets:  Communication Skills Desire for Improvement Financial Resources/Insurance Housing Physical Health Resilience  ADL's:  Intact  Cognition:  WNL  Sleep:         COGNITIVE FEATURES THAT CONTRIBUTE TO RISK:  None    SUICIDE RISK:   Moderate:  Frequent suicidal ideation with limited intensity, and duration, some specificity in terms of plans, no associated intent, good self-control, limited dysphoria/symptomatology, some risk factors present, and identifiable protective factors, including available and accessible social support.  PLAN OF CARE: Hospital admission, medication management, discharge planning.   [name] is a 26 year old male with a history of schizoaffective disorder admitted for aggressive behavior at the group home in the context of medication noncompliance.  #Agitation -Resolved  #Mood and psychosis -Continue Haldol 10 mg twice daily -Continue Cogentin 1 mg twice daily -Continue Depakote 750 mg twice daily -Continue  Luvox 100 mg nightly  #Diabetes -Continue metformin 1000 mg twice daily  #Dyslipidemia -Continue atorvastatin  #Insomnia -Continue trazodone  #Hypertension -Continue lisinopril  #Constipation -Continue  bisacodyl  #Smoking -Nicotine patch is available  #Metabolic syndrome monitoring -Labs were obtained in September 2018 -EKG pending  #Social -He is an incompetent adult [facility]  #Disposition -Discharged to the group home -Follow-up with [facility] team  I certify that inpatient services furnished can reasonably be expected to improve the patient's condition.   Kristine LineaJolanta Pucilowska, MD 03/29/2017, 1:32 PM
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
## 2017-03-29 NOTE — Social Work (Signed)
MAR, psychiatric evaluation received from current ACT team, [facility]. Will be placed w paper chart.  ACT TEam NP Sharman CrateLaurie Arena would like call from MD - [phone] - cell).  Santa GeneraAnne Dawanna Grauberger, LCSW Lead Clinical Social Worker Phone:  [phone]
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
## 2017-03-30 NOTE — [facility] Group Notes (Signed)
03/30/2017 1:15pm  Type of Therapy and Topic:  Group Therapy:  Fears and Unhealthy Coping Skills  Participation Level:  Did Not Attend    Saurav Crumble  CUEBAS-COLON, LCSW 03/30/2017 12:23 PM

## 2017-03-30 NOTE — Progress Notes (Signed)
Denied all psych symptoms, stating " I want to go home." Asked to shave. Pt took a great deal of time, he lathered his face with soap then covered the razor with shampoo. Process took about 30 minutes. Pt stated "I have to make sure I do it right." Was med compliant. Remains on routine obs for safety.

## 2017-03-30 NOTE — Plan of Care (Signed)
Patient isolative to room. Pleasant and cooperative. No anger or psychosis noted. Pt reports sleeping well last night, but slept most of morning. Did come out for lunch.  Denies SI, HI, AVH. Encouragement and support offered. Safety checks maintained. Medications given as prescribed. Pt receptive and remains safe on unit with q 15 min checks.
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
## 2017-03-30 NOTE — Progress Notes (Signed)
0630 Refused labs this AM and refused to get up for VS

## 2017-03-31 NOTE — [facility] Group Notes (Signed)
03/31/2017 1:15pm  Type of Therapy and Topic:  Group Therapy:  Healthy Self Image and Positive Change  Participation Level:  Did Not Attend     Masiya Claassen  CUEBAS-COLON, LCSW 03/31/2017 12:40 PM
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
## 2017-03-31 NOTE — Plan of Care (Signed)
Patient is guarded and having minimal interactions with staff & peers.Isolated in the room most of the time.Denies SI,HI and AVH.Patient states "I want to go home".In the morning patient states "I am so tired I can not move."Patient did not get up for breakfast.Compliant with medications.Support and encouragement given.
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
## 2017-04-01 MED ORDER — HALOPERIDOL LACTATE 2 MG/ML PO CONC
10.0000 mg | Freq: Two times a day (BID) | ORAL | Status: DC
  Administered 2017-04-02: 10 mg via ORAL
  Filled 2017-04-01 (×4): qty 5

## 2017-04-01 MED ORDER — VALPROATE SODIUM 250 MG/5ML PO SOLN
750.0000 mg | Freq: Two times a day (BID) | ORAL | Status: DC
  Administered 2017-04-02: 750 mg via ORAL
  Filled 2017-04-01 (×4): qty 15

## 2017-04-01 NOTE — [facility] Group Notes (Addendum)
[facility] Group Notes:  (Nursing/MHT/Case Management/Adjunct)  Date:  04/01/2017  Time:  3:28 PM  Type of Therapy:  Psychoeducational Skills  Participation Level: did not attend    [name] 04/01/2017, 3:28 PM

## 2017-04-01 NOTE — Progress Notes (Signed)
Received John Copeland this am asleep in bed, attempted to awaken him several times this am for medications and blood draw. He refused to respond. He got up after lunch and requested a late tray. He received his lunch and remained OOB in the day room, afterwards then went outside with the group. He was approached on several occassions to take his medications. He refused throughout the day. This Clinical research associatewriter approached him at 1830 hrs for compliance with his medications. I did not get a response from him.

## 2017-04-01 NOTE — [facility] Group Notes (Signed)
[facility] Group Notes:  (Nursing/MHT/Case Management/Adjunct)  Date:  04/01/2017  Time:  11:52 PM  Type of Therapy:  Evening Wrap-up Group  Participation Level:  Minimal  Participation Quality:  Inattentive  Affect:  Flat  Cognitive:  Alert  Insight:  None  Engagement in Group:  Minimal  Modes of Intervention:  Discussion  Summary of Progress/Problems:  Tomasita MorrowChelsea Nanta Glenys Snader 04/01/2017, 11:52 PM
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
## 2017-04-01 NOTE — [facility] Group Notes (Signed)
04/01/2017 0930   Type of Therapy and Topic:  Group Therapy:  Overcoming Obstacles   Participation Level:  Did Not Attend   Description of Group:   In this group patients will be encouraged to explore what they see as obstacles to their own wellness and recovery. They will be guided to discuss their thoughts, feelings, and behaviors related to these obstacles. The group will process together ways to cope with barriers, with attention given to specific choices patients can make. Each patient will be challenged to identify changes they are motivated to make in order to overcome their obstacles. This group will be process-oriented, with patients participating in exploration of their own experiences, giving and receiving support, and processing challenge from other group members.   Therapeutic Goals: 1. Patient will identify personal and current obstacles as they relate to admission. 2. Patient will identify barriers that currently interfere with their wellness or overcoming obstacles.  3. Patient will identify feelings, thought process and behaviors related to these barriers. 4. Patient will identify two changes they are willing to make to overcome these obstacles:      Summary of Patient Progress  Pt did not attend group.    Therapeutic Modalities:   Cognitive Behavioral Therapy Solution Focused Therapy Motivational Interviewing Relapse Prevention Therapy  Heidi DachKelsey Kyndall Chaplin, MSW, LCSW 04/01/2017 12:43 PM

## 2017-04-01 NOTE — Progress Notes (Signed)
Recreation Therapy Notes  Date: 11.19.18  Time: 1:00 pm  Location: Craft Room  Behavioral response: N/A  Intervention Topic: Coping Skills  Discussion/Intervention: Patient did not attend group. Clinical Observations/Feedback:  Patient did not attend group.  Astha Probasco LRT/CTRS         Kaya Pottenger 04/01/2017 2:34 PM

## 2017-04-01 NOTE — Progress Notes (Signed)
D: Pt denies SI/HI/AVH. Pt is refusing medications, is blunted and apathetic. Patient has no physical complaints at this time. Patient was in dayroom for snack, patient remained in room for most of shift. Patient refused night medications stating "they make me too sleepy, I want to be up to talk to the doctor."  A: Q x 15 minute observation checks were completed for safety. Patient was provided with education on medications. Patient was offered support and encouragement. Patient offered scheduled medications, patient refused. Patient  was encourage to attend groups, participate in unit activities and continue with plan of care.   R: Patient refused medication. Patient has no complaints at this time. Patient is receptive to treatment and safety maintained on unit.

## 2017-04-01 NOTE — Tx Team (Signed)
Interdisciplinary Treatment and Diagnostic Plan Update  04/01/2017 Time of Session: [place] MRN: [id number]  Principal Diagnosis: Schizoaffective disorder, bipolar type [facility])  Secondary Diagnoses: Principal Problem:   Schizoaffective disorder, bipolar type ([place]) Active Problems:   Tobacco use disorder   Diabetes ([place])   HTN (hypertension)   Dyslipidemia   Current Medications:  Current Facility-Administered Medications  Medication Dose Route Frequency Provider Last Rate Last Dose  . acetaminophen (TYLENOL) tablet 650 mg  650 mg Oral Q6H PRN Clapacs, John T, MD      . alum & mag hydroxide-simeth (MAALOX/MYLANTA) 200-200-20 MG/5ML suspension 30 mL  30 mL Oral Q4H PRN Clapacs, John T, MD      . atorvastatin (LIPITOR) tablet 10 mg  10 mg Oral q1800 Clapacs, Madie Reno, MD   10 mg at 03/31/17 1642  . benztropine (COGENTIN) tablet 1 mg  1 mg Oral BID Clapacs, Madie Reno, MD   1 mg at 03/31/17 1642  . fluvoxaMINE (LUVOX) tablet 100 mg  100 mg Oral QHS Clapacs, John T, MD   100 mg at 03/31/17 2143  . haloperidol (HALDOL) 2 MG/ML solution 10 mg  10 mg Oral BID Pucilowska, Jolanta B, MD      . lisinopril (PRINIVIL,ZESTRIL) tablet 10 mg  10 mg Oral Daily Clapacs, Madie Reno, MD   10 mg at 03/31/17 1148  . magnesium hydroxide (MILK OF MAGNESIA) suspension 30 mL  30 mL Oral Daily PRN Clapacs, John T, MD      . metFORMIN (GLUCOPHAGE) tablet 1,000 mg  1,000 mg Oral BID WC Clapacs, John T, MD   1,000 mg at 03/31/17 1642  . traZODone (DESYREL) tablet 100 mg  100 mg Oral QHS Clapacs, Madie Reno, MD   100 mg at 03/31/17 2143  . Valproate Sodium (DEPAKENE) solution 750 mg  750 mg Oral BID Pucilowska, Jolanta B, MD       PTA Medications: Medications Prior to Admission  Medication Sig Dispense Refill Last Dose  . atorvastatin (LIPITOR) 10 MG tablet Take 1 tablet (10 mg total) by mouth daily at 6 PM. 30 tablet 1   . benztropine (COGENTIN) 1 MG tablet Take 1 tablet (1 mg total) by mouth daily. 30 tablet  1   . diphenhydrAMINE (BENADRYL) 50 MG capsule Take 1 capsule (50 mg total) by mouth at bedtime. 30 capsule 0 Past Month at Unknown time  . divalproex (DEPAKOTE ER) 250 MG 24 hr tablet Take 3 tablets (750 mg total) by mouth 2 (two) times daily at 8 am and 10 pm. 180 tablet 1   . fluvoxaMINE (LUVOX) 100 MG tablet Take 1 tablet (100 mg total) by mouth at bedtime. 30 tablet 1   . haloperidol (HALDOL) 10 MG tablet Take 1 tablet (10 mg total) by mouth 2 (two) times daily. 60 tablet 1   . lisinopril (PRINIVIL,ZESTRIL) 10 MG tablet Take 1 tablet (10 mg total) by mouth daily. 30 tablet 1   . metFORMIN (GLUCOPHAGE) 1000 MG tablet Take 1 tablet (1,000 mg total) by mouth 2 (two) times daily with a meal. 60 tablet 1   . Multiple Vitamin (MULTIVITAMIN WITH MINERALS) TABS tablet Take 1 tablet by mouth daily. 30 tablet 1   . traZODone (DESYREL) 100 MG tablet Take 1 tablet (100 mg total) by mouth at bedtime. 30 tablet 0     Patient Stressors: Educational concerns Medication change or noncompliance  Patient Strengths: Average or above average intelligence Communication skills  Treatment Modalities: Medication Management, Group therapy,  Case management,  1 to 1 session with clinician, Psychoeducation, Recreational therapy.   Physician Treatment Plan for Primary Diagnosis: Schizoaffective disorder, bipolar type ([place]) Long Term Goal(s): Improvement in symptoms so as ready for discharge NA   Short Term Goals: Ability to identify changes in lifestyle to reduce recurrence of condition will improve Ability to verbalize feelings will improve Ability to disclose and discuss suicidal ideas Ability to demonstrate self-control will improve Ability to identify and develop effective coping behaviors will improve Ability to maintain clinical measurements within normal limits will improve Compliance with prescribed medications will improve Ability to identify triggers associated with substance abuse/mental health  issues will improve NA  Medication Management: Evaluate patient's response, side effects, and tolerance of medication regimen.  Therapeutic Interventions: 1 to 1 sessions, Unit Group sessions and Medication administration.  Evaluation of Outcomes: Not Met  Physician Treatment Plan for Secondary Diagnosis: Principal Problem:   Schizoaffective disorder, bipolar type ([place]) Active Problems:   Tobacco use disorder   Diabetes ([place])   HTN (hypertension)   Dyslipidemia  Long Term Goal(s): Improvement in symptoms so as ready for discharge NA   Short Term Goals: Ability to identify changes in lifestyle to reduce recurrence of condition will improve Ability to verbalize feelings will improve Ability to disclose and discuss suicidal ideas Ability to demonstrate self-control will improve Ability to identify and develop effective coping behaviors will improve Ability to maintain clinical measurements within normal limits will improve Compliance with prescribed medications will improve Ability to identify triggers associated with substance abuse/mental health issues will improve NA     Medication Management: Evaluate patient's response, side effects, and tolerance of medication regimen.  Therapeutic Interventions: 1 to 1 sessions, Unit Group sessions and Medication administration.  Evaluation of Outcomes: Not Met   RN Treatment Plan for Primary Diagnosis: Schizoaffective disorder, bipolar type ([place]) Long Term Goal(s): Knowledge of disease and therapeutic regimen to maintain health will improve  Short Term Goals: Ability to verbalize feelings will improve, Ability to identify and develop effective coping behaviors will improve and Compliance with prescribed medications will improve  Medication Management: RN will administer medications as ordered by provider, will assess and evaluate patient's response and provide education to patient for prescribed medication. RN will report any adverse  and/or side effects to prescribing provider.  Therapeutic Interventions: 1 on 1 counseling sessions, Psychoeducation, Medication administration, Evaluate responses to treatment, Monitor vital signs and CBGs as ordered, Perform/monitor CIWA, COWS, AIMS and Fall Risk screenings as ordered, Perform wound care treatments as ordered.  Evaluation of Outcomes: Not Met   LCSW Treatment Plan for Primary Diagnosis: Schizoaffective disorder, bipolar type ([place]) Long Term Goal(s): Safe transition to appropriate next level of care at discharge, Engage patient in therapeutic group addressing interpersonal concerns.  Short Term Goals: Engage patient in aftercare planning with referrals and resources, Increase social support and Increase skills for wellness and recovery  Therapeutic Interventions: Assess for all discharge needs, 1 to 1 time with Social worker, Explore available resources and support systems, Assess for adequacy in community support network, Educate family and significant other(s) on suicide prevention, Complete Psychosocial Assessment, Interpersonal group therapy.  Evaluation of Outcomes: Not Met   Progress in Treatment: Attending groups: No. Participating in groups: No. Taking medication as prescribed: Yes. Toleration medication: Yes. Family/Significant other contact made: No, will contact:  when given consent. Patient understands diagnosis: No. Discussing patient identified problems/goals with staff: No. Medical problems stabilized or resolved: Yes. Denies suicidal/homicidal ideation: Yes. Issues/concerns per patient self-inventory:  No. Other: none  New problem(s) identified: No, Describe:  none  New Short Term/Long Term Goal(s):Pt unable to attend meeting today.  Discharge Plan or Barriers: Pt will return to ACT team services.  Reason for Continuation of Hospitalization: Medication stabilization  Estimated Length of Stay: 5-7 days.  Attendees: Patient: 04/01/2017    Physician: Dr. Bary Leriche, MD 04/01/2017   Nursing: Polly Cobia, RN 04/01/2017   RN Care Manager: 04/01/2017   Social Worker: Lurline Idol, LCSW 04/01/2017   Recreational Therapist: Roanna Epley, LRT/CTRS 04/01/2017   Other:  04/01/2017   Other:  04/01/2017   Other: 04/01/2017      Scribe for Treatment Team: Joanne Chars, [employer] 04/01/2017 2:53 PM

## 2017-04-01 NOTE — [facility] Group Notes (Signed)
[facility] Group Notes:  (Nursing/MHT/Case Management/Adjunct)  Date:  04/01/2017  Time:  2:24 AM  Type of Therapy:  Psychoeducational Skills  Participation Level:  None   John Copeland  John Copeland 04/01/2017, 2:24 AM

## 2017-04-01 NOTE — Plan of Care (Signed)
  Progressing Activity: Sleeping patterns will improve 04/01/2017 2159 - Progressing by Berkley Harveyeynolds, Madeeha Costantino I, RN Education: Knowledge of [place] [facility] Education information/materials will improve 04/01/2017 2159 - Progressing by Addison Naegelieynolds, Kaniah Rizzolo I, RN Safety: Periods of time without injury will increase 04/01/2017 2159 - Progressing by Berkley Harveyeynolds, Loretha Ure I, RN

## 2017-04-02 NOTE — [facility] Suicide Risk Assessment (Addendum)
[facility] INPATIENT:  Family/Significant Other Suicide Prevention Education  Suicide Prevention Education:  Contact Attempts: Pt's legal guardian, [name] [phone])  has been identified by the patient as the family member/significant other with whom the patient will be residing, and identified as the person(s) who will aid the patient in the event of a mental health crisis.  With written consent from the patient, two attempts were made to provide suicide prevention education, prior to and/or following the patient's discharge.  We were unsuccessful in providing suicide prevention education.  A suicide education pamphlet was given to the patient to share with family/significant other.  Date and time of first attempt:04/02/17 at 1500. Date and time of second attempt: 04/02/17, 1555  CSW received a call back from pt's guardian. Pt's guardian expressed understanding and agreement with SPE, and stated that pt does not have access to weapons/other means of self harm in the community. Pt's guardian expressed, "Be careful. When he's not getting the attention he needs, he'll start hitting people--so, be careful." Pt's guardian reported he did not have other concerns for pt's tx. Pt's guardian added, "This is usually what happens when he's admitted. He wont' take meds, they do a forced med order, and then he'll start to clear up a little bit." CSW will continue to coordinate with pt's guardian as needed for updates/discharge planning.   Heidi DachKelsey Annalisia Ingber, LCSW 04/02/2017, 3:03 PM

## 2017-04-02 NOTE — Progress Notes (Signed)
Recreation Therapy Notes  Date: 11.20.18  Time: 1:00 pm  Location: [facility]  Behavioral response: N/A  Intervention Topic: Time Management  Discussion/Intervention: Patient did not attend group. Clinical Observations/Feedback:  Patient did not attend group. Ladan Vanderzanden LRT/CTRS           John Copeland 04/02/2017 2:35 PM 

## 2017-04-02 NOTE — Progress Notes (Signed)
D: Pt remains isolative to his room majority of this shift. Awake in bed with head covered up on initial approach "it's not important to me" when approached for medications, appears irritable then. Denies SI, HI, AVH and pain. Out of bed this afternoon, guarded, observed pacing hall. Observed out of bed in dayroom for meals and back to bed. A: All medications offered to pt X3 this shift but he continues to refused. Assigned psychiatrist made aware, states will contact pt's guardian for possible force medication order. Writer encouraged pt to voice concerns, attends scheduled unit groups and activities. Emotional support and availability provided to pt. Safety checks continues at Q 15 minutes intervals without self harm gestures or outburst to note thus far. R: Pt continues to refuse care (medications, labs and groups). Remains safe on unit. POC continues for safety and mood stability.

## 2017-04-02 NOTE — Progress Notes (Signed)
[facility]  04/02/2017 11:30 AM John Copeland  MRN:  [id number]  Subjective:   John Copeland refused medications last night and this morning. He is in bed with his eyes closed telling me that "he is asleep". He is not suicidal or homicidal. No more behavioral problems. He understands that medications will be forced if he continues to refuse.  Treatment plan. Continue Haldol, Depakote and Luvox. It is available in liquid form now to improve compliance. We will contact his guardian to discuss forced medications.  Social/disposition. He will return to his group home. Follow up with [facility] ACT team.  Principal Problem: Schizoaffective disorder, bipolar type (H[facility]) Diagnosis:   Patient Active Problem List   Diagnosis Date Noted  . Schizophrenia (HCC) [F20.9] 03/29/2017  . Polydipsia [R63.1] 01/12/2017  . Diphenhydramine overdose of undetermined intent [T45.0X4A] 01/10/2017  . Tobacco use disorder [F17.200] 10/07/2016  . Diabetes (HCC) [E11.9] 10/07/2016  . HTN (hypertension) [I10] 10/07/2016  . Dyslipidemia [E78.5] 10/07/2016  . Schizoaffective disorder, bipolar type (HCC) [F25.0] 11/30/2015   Total Time spent with patient: 20 minutes  Past Psychiatric History: schizoaffective disorder.  Past Medical History:  Past Medical History:  Diagnosis Date  . Anxiety   . Depression   . Diabetes mellitus without complication (HCC)   . Schizophrenia (HCC)    History reviewed. No pertinent surgical history. Family History: History reviewed. No pertinent family history. Family Psychiatric  History: unknown. Social History:  Social History   Substance and Sexual Activity  Alcohol Use No     Social History   Substance and Sexual Activity  Drug Use No    Social History   Socioeconomic History  . Marital status: Single    Spouse name: None  . Number of children: None  . Years of education: None  . Highest education level: None  Social Needs  . Financial resource strain:  None  . Food insecurity - worry: None  . Food insecurity - inability: None  . Transportation needs - medical: None  . Transportation needs - non-medical: None  Occupational History  . None  Tobacco Use  . Smoking status: Current Every Day Smoker    Packs/day: 0.50    Types: Cigarettes  . Smokeless tobacco: Never Used  Substance and Sexual Activity  . Alcohol use: No  . Drug use: No  . Sexual activity: None  Other Topics Concern  . None  Social History Narrative  . None   Additional Social History:    Pain Medications: See PTA Prescriptions: See PTA Over the Counter: See PTA History of alcohol / drug use?: No history of alcohol / drug abuse Longest period of sobriety (when/how long): No past or current use of mind altering substances                    Sleep: Fair  Appetite:  Fair  Current Medications: Current Facility-Administered Medications  Medication Dose Route Frequency Provider Last Rate Last Dose  . acetaminophen (TYLENOL) tablet 650 mg  650 mg Oral Q6H PRN Clapacs, John T, MD      . alum & mag hydroxide-simeth (MAALOX/MYLANTA) 200-200-20 MG/5ML suspension 30 mL  30 mL Oral Q4H PRN Clapacs, John T, MD      . atorvastatin (LIPITOR) tablet 10 mg  10 mg Oral q1800 Clapacs, Jackquline DenmarkJohn T, MD   10 mg at 03/31/17 1642  . benztropine (COGENTIN) tablet 1 mg  1 mg Oral BID Clapacs, Jackquline DenmarkJohn T, MD   1 mg  at 03/31/17 1642  . fluvoxaMINE (LUVOX) tablet 100 mg  100 mg Oral QHS Clapacs, John T, MD   100 mg at 03/31/17 2143  . haloperidol (HALDOL) 2 MG/ML solution 10 mg  10 mg Oral BID Antawan Mchugh B, MD      . lisinopril (PRINIVIL,ZESTRIL) tablet 10 mg  10 mg Oral Daily Clapacs, Jackquline [place], MD   10 mg at 03/31/17 1148  . magnesium hydroxide (MILK OF MAGNESIA) suspension 30 mL  30 mL Oral Daily PRN Clapacs, John T, MD      . metFORMIN (GLUCOPHAGE) tablet 1,000 mg  1,000 mg Oral BID WC Clapacs, John T, MD   1,000 mg at 03/31/17 1642  . traZODone (DESYREL) tablet 100 mg  100 mg  Oral QHS Clapacs, Jackquline [place], MD   100 mg at 03/31/17 2143  . Valproate Sodium (DEPAKENE) solution 750 mg  750 mg Oral BID Livianna Petraglia B, MD        Lab Results: No results found for this or any previous visit (from the past 48 hour(s)).  Blood Alcohol level:  Lab Results  Component Value Date   ETH <10 03/27/2017   ETH <10 03/16/2017    Metabolic Disorder Labs: Lab Results  Component Value Date   HGBA1C 5.8 (H) 01/19/2017   MPG 119.76 01/19/2017   No results found for: PROLACTIN Lab Results  Component Value Date   CHOL 103 01/19/2017   TRIG 48 01/19/2017   HDL 36 (L) 01/19/2017   CHOLHDL 2.9 01/19/2017   VLDL 10 01/19/2017   LDLCALC 57 01/19/2017    Physical Findings: AIMS: Facial and Oral Movements Muscles of Facial Expression: None, normal Lips and Perioral Area: None, normal Jaw: None, normal Tongue: None, normal,Extremity Movements Upper (arms, wrists, hands, fingers): None, normal Lower (legs, knees, ankles, toes): None, normal, Trunk Movements Neck, shoulders, hips: None, normal, Overall Severity Severity of abnormal movements (highest score from questions above): None, normal Incapacitation due to abnormal movements: None, normal Patient's awareness of abnormal movements (rate only patient's report): No Awareness, Dental Status Current problems with teeth and/or dentures?: No  CIWA:    COWS:     Musculoskeletal: Strength & Muscle Tone: within normal limits Gait & Station: normal Patient leans: N/A  Psychiatric Specialty Exam: Physical Exam  Nursing note and vitals reviewed. Psychiatric: His affect is blunt and inappropriate. His speech is delayed. He is slowed, withdrawn and actively hallucinating. Thought content is paranoid and delusional. Cognition and memory are impaired. He expresses impulsivity.    Review of Systems  Neurological: Negative.   Psychiatric/Behavioral: Positive for hallucinations.  All other systems reviewed and are negative.    Blood pressure (!) 146/84, pulse 70, temperature 98.3 F (36.8 C), temperature source Oral, resp. rate 18, height 6' (1.829 m), weight 78.5 kg (173 lb), SpO2 100 %.Body mass index is 23.46 kg/m.  General Appearance: Disheveled  Eye Contact:  None  Speech:  Blocked  Volume:  Decreased  Mood:  Dysphoric  Affect:  Congruent  Thought Process:  Linear  Orientation:  Full (Time, Place, and Person)  Thought Content:  Delusions, Hallucinations: Auditory and Paranoid Ideation  Suicidal Thoughts:  No  Homicidal Thoughts:  No  Memory:  Immediate;   Fair Recent;   Fair Remote;   Fair  Judgement:  Poor  Insight:  Lacking  Psychomotor Activity:  Decreased  Concentration:  Concentration: Poor and Attention Span: Poor  Recall:  Poor  Fund of Knowledge:  Poor  Language:  Poor  Akathisia:  No  Handed:  Right  AIMS (if indicated):     Assets:  Communication Skills Desire for Improvement Financial Resources/Insurance Housing Physical Health Resilience  ADL's:  Intact  Cognition:  WNL  Sleep:  Number of Hours: 7.45     Treatment Plan Summary: Daily contact with patient to assess and evaluate symptoms and progress in treatment and Medication management   John Copeland is a 26 year old male with a history of schizoaffective disorder admitted for aggressive behavior at the group home in the context of medication noncompliance. Pt guarded, withdrawn, impulsive.   #Agitation -Resolved  #Mood and psychosis, patient refuses medications -Continue Haldol 10 mg twice daily -Continue Cogentin 1 mg twice daily -Continue Depakote 750 mg twice daily, level pending but patient refuses labs -Continue Luvox 100 mg nightly -will contact his guardian to discuss forced medications as some guardians would not allow it  #Diabetes -Continue metformin 1000 mg twice daily  #Dyslipidemia -Continue atorvastatin  #Insomnia -Continue trazodone  #Hypertension -Continue  lisinopril  #Constipation -Continue bisacodyl  #Smoking -Nicotine patch is available  #Metabolic syndrome monitoring -Labs were obtained in September 2018 -EKG pending  #Social -[facility] is an incompetent [facility]  #Disposition -it is unclear if is allowed to return to his group home -follow up with [facility] ACT team     John LineaJolanta Faruq Rosenberger, MD 04/02/2017, 11:30 AM

## 2017-04-03 MED ORDER — DIVALPROEX SODIUM 500 MG PO DR TAB
500.0000 mg | DELAYED_RELEASE_TABLET | Freq: Three times a day (TID) | ORAL | Status: DC
  Administered 2017-04-03 – 2017-04-09 (×18): 500 mg via ORAL
  Filled 2017-04-03 (×18): qty 1

## 2017-04-03 MED ORDER — HALOPERIDOL 5 MG PO TABS
10.0000 mg | ORAL_TABLET | Freq: Two times a day (BID) | ORAL | Status: DC
  Administered 2017-04-03 – 2017-04-10 (×14): 10 mg via ORAL
  Filled 2017-04-03 (×12): qty 2

## 2017-04-03 MED ORDER — HALOPERIDOL LACTATE 5 MG/ML IJ SOLN
10.0000 mg | Freq: Two times a day (BID) | INTRAMUSCULAR | Status: DC
  Filled 2017-04-03 (×2): qty 2

## 2017-04-03 NOTE — Plan of Care (Signed)
Noncompliant with  unit programing . Patient  remains to stay in bed during shift  with bed linens pulled over head . Limited eye contact . Sleeping through out shift Patient is  limited in cognitive skills .

## 2017-04-03 NOTE — [facility] Group Notes (Signed)
  [facility] LCSW Group Therapy Note  Date/Time: 04/03/17, 0930  Type of Therapy/Topic:  Group Therapy:  Emotion Regulation  Participation Level:  Did Not Attend   Mood:  Description of Group:    The purpose of this group is to assist patients in learning to regulate negative emotions and experience positive emotions. Patients will be guided to discuss ways in which they have been vulnerable to their negative emotions. These vulnerabilities will be juxtaposed with experiences of positive emotions or situations, and patients challenged to use positive emotions to combat negative ones. Special emphasis will be placed on coping with negative emotions in conflict situations, and patients will process healthy conflict resolution skills.  Therapeutic Goals: 1. Patient will identify two positive emotions or experiences to reflect on in order to balance out negative emotions:  2. Patient will label two or more emotions that they find the most difficult to experience:  3. Patient will be able to demonstrate positive conflict resolution skills through discussion or role plays:   Summary of Patient Progress:       Therapeutic Modalities:   Cognitive Behavioral Therapy Feelings Identification Dialectical Behavioral Therapy  Greg Reyan Helle, LCSW 

## 2017-04-03 NOTE — Progress Notes (Signed)
[facility] MD Progress Note  04/03/2017 1:58 PM John Copeland  MRN:  [id number]  Subjective:   John Copeland is a 26 year old male with schizophrenia admitted for agitation in the context of noncompliance.  John Copeland is not participating in the interview this morning but agreed to take his medications when informed that they will be forced if refused per his guardian decision. He is up and runnibng in the afternoon. No behavioral problems.  Treatment plan. We will continue Depakote 500 mg TID and Haldol 10 mg BID po or IM. Patient refuses injectable antipsychotics. Consider Clozapine.  Social/disposition. He is incopmetent adult. It is unclear if he is allowed to return to his group home. Follow up with ACT team.  Principal Problem: Schizoaffective disorder, bipolar type (HCC) Diagnosis:   Patient Active Problem List   Diagnosis Date Noted  . Schizophrenia (HCC) [F20.9] 03/29/2017  . Polydipsia [R63.1] 01/12/2017  . Diphenhydramine overdose of undetermined intent [T45.0X4A] 01/10/2017  . Tobacco use disorder [F17.200] 10/07/2016  . Diabetes (HCC) [E11.9] 10/07/2016  . HTN (hypertension) [I10] 10/07/2016  . Dyslipidemia [E78.5] 10/07/2016  . Schizoaffective disorder, bipolar type (HCC) [F25.0] 11/30/2015   Total Time spent with patient: 20 minutes  Past Psychiatric History: schizphrenia.  Past Medical History:  Past Medical History:  Diagnosis Date  . Anxiety   . Depression   . Diabetes mellitus without complication (HCC)   . Schizophrenia (HCC)    History reviewed. No pertinent surgical history. Family History: History reviewed. No pertinent family history. Family Psychiatric  History: unknown. Social History:  Social History   Substance and Sexual Activity  Alcohol Use No     Social History   Substance and Sexual Activity  Drug Use No    Social History   Socioeconomic History  . Marital status: Single    Spouse name: None  . Number of children: None  . Years  of education: None  . Highest education level: None  Social Needs  . Financial resource strain: None  . Food insecurity - worry: None  . Food insecurity - inability: None  . Transportation needs - medical: None  . Transportation needs - non-medical: None  Occupational History  . None  Tobacco Use  . Smoking status: Current Every Day Smoker    Packs/day: 0.50    Types: Cigarettes  . Smokeless tobacco: Never Used  Substance and Sexual Activity  . Alcohol use: No  . Drug use: No  . Sexual activity: None  Other Topics Concern  . None  Social History Narrative  . None   Additional Social History:    Pain Medications: See PTA Prescriptions: See PTA Over the Counter: See PTA History of alcohol / drug use?: No history of alcohol / drug abuse Longest period of sobriety (when/how long): No past or current use of mind altering substances                    Sleep: Fair  Appetite:  Fair  Current Medications: Current Facility-Administered Medications  Medication Dose Route Frequency Provider Last Rate Last Dose  . acetaminophen (TYLENOL) tablet 650 mg  650 mg Oral Q6H PRN Clapacs, John T, MD      . alum & mag hydroxide-simeth (MAALOX/MYLANTA) 200-200-20 MG/5ML suspension 30 mL  30 mL Oral Q4H PRN Clapacs, John T, MD      . atorvastatin (LIPITOR) tablet 10 mg  10 mg Oral q1800 Clapacs, Jackquline Denmark, MD   10 mg at 03/31/17 1642  . benztropine (  COGENTIN) tablet 1 mg  1 mg Oral BID Clapacs, Jackquline DenmarkJohn T, MD   1 mg at 04/03/17 0949  . divalproex (DEPAKOTE) DR tablet 500 mg  500 mg Oral Q8H John Pund B, MD   500 mg at 04/03/17 1351  . fluvoxaMINE (LUVOX) tablet 100 mg  100 mg Oral QHS Clapacs, John T, MD   100 mg at 04/02/17 2200  . haloperidol (HALDOL) tablet 10 mg  10 mg Oral BID Nikia Mangino B, MD   10 mg at 04/03/17 1134   Or  . haloperidol lactate (HALDOL) injection 10 mg  10 mg Intramuscular BID Onesimo Lingard B, MD      . lisinopril (PRINIVIL,ZESTRIL) tablet 10  mg  10 mg Oral Daily Clapacs, Jackquline DenmarkJohn T, MD   10 mg at 04/03/17 0950  . magnesium hydroxide (MILK OF MAGNESIA) suspension 30 mL  30 mL Oral Daily PRN Clapacs, John T, MD      . metFORMIN (GLUCOPHAGE) tablet 1,000 mg  1,000 mg Oral BID WC Clapacs, Jackquline DenmarkJohn T, MD   1,000 mg at 04/03/17 0949  . traZODone (DESYREL) tablet 100 mg  100 mg Oral QHS Clapacs, John T, MD   100 mg at 04/02/17 2200    Lab Results: No results found for this or any previous visit (from the past 48 hour(s)).  Blood Alcohol level:  Lab Results  Component Value Date   ETH <10 03/27/2017   ETH <10 03/16/2017    Metabolic Disorder Labs: Lab Results  Component Value Date   HGBA1C 5.8 (H) 01/19/2017   MPG 119.76 01/19/2017   No results found for: PROLACTIN Lab Results  Component Value Date   CHOL 103 01/19/2017   TRIG 48 01/19/2017   HDL 36 (L) 01/19/2017   CHOLHDL 2.9 01/19/2017   VLDL 10 01/19/2017   LDLCALC 57 01/19/2017    Physical Findings: AIMS: Facial and Oral Movements Muscles of Facial Expression: None, normal Lips and Perioral Area: None, normal Jaw: None, normal Tongue: None, normal,Extremity Movements Upper (arms, wrists, hands, fingers): None, normal Lower (legs, knees, ankles, toes): None, normal, Trunk Movements Neck, shoulders, hips: None, normal, Overall Severity Severity of abnormal movements (highest score from questions above): None, normal Incapacitation due to abnormal movements: None, normal Patient's awareness of abnormal movements (rate only patient's report): No Awareness, Dental Status Current problems with teeth and/or dentures?: No Does patient usually wear dentures?: No  CIWA:    COWS:     Musculoskeletal: Strength & Muscle Tone: within normal limits Gait & Station: normal Patient leans: N/A  Psychiatric Specialty Exam: Physical Exam  Nursing note and vitals reviewed. Psychiatric: His affect is blunt and inappropriate. His speech is delayed. He is slowed, withdrawn and  actively hallucinating. Thought content is paranoid and delusional. Cognition and memory are impaired. He expresses impulsivity.    Review of Systems  Neurological: Negative.   Psychiatric/Behavioral: Positive for hallucinations.  All other systems reviewed and are negative.   Blood pressure 100/60, pulse 70, temperature 98.3 F (36.8 C), temperature source Oral, resp. rate 18, height 6' (1.829 m), weight 78.5 kg (173 lb), SpO2 100 %.Body mass index is 23.46 kg/m.  General Appearance: Casual  Eye Contact:  Fair  Speech:  Blocked  Volume:  Decreased  Mood:  Irritable  Affect:  Blunt  Thought Process:  Disorganized  Orientation:  Full (Time, Place, and Person)  Thought Content:  Hallucinations: Auditory  Suicidal Thoughts:  No  Homicidal Thoughts:  No  Memory:  Immediate;  Poor Recent;   Poor Remote;   Poor  Judgement:  Poor  Insight:  Lacking  Psychomotor Activity:  Psychomotor Retardation  Concentration:  Concentration: Fair and Attention Span: Poor  Recall:  Poor  Fund of Knowledge:  Poor  Language:  Poor  Akathisia:  No  Handed:  Right  AIMS (if indicated):     Assets:  Manufacturing systems engineerCommunication Skills Physical Health Resilience Social Support  ADL's:  Intact  Cognition:  WNL  Sleep:  Number of Hours: 7.15     Treatment Plan Summary: Daily contact with patient to assess and evaluate symptoms and progress in treatment and Medication management   [name] is a 26 year old male with a history of schizoaffective disorder admitted for aggressive behavior at the group home in the context of medication noncompliance. Pt guarded, withdrawn, impulsive.   #Agitation -Resolved  #Mood and psychosis -Continue Haldol 10 mg twice daily, may give IM per NEFM order -Continue Cogentin 1 mg twice daily -Continue Depakote 750 mg twice daily, level pending but patient refuses labs -Continue Luvox 100 mg nightly  #Diabetes -Continue metformin 1000 mg twice  daily  #Dyslipidemia -Continue atorvastatin  #Insomnia -Continue trazodone  #Hypertension -Continue lisinopril  #Constipation -Continue bisacodyl  #Smoking -Nicotine patch is available  #Metabolic syndrome monitoring -Labs were obtained in September 2018 -EKG pending  #Social -He is an incompetent adult [facility]  #Disposition -it is unclear if is allowed to return to his group home -follow up with [facility] team      Kristine LineaJolanta Kathrynne Kulinski, MD 04/03/2017, 1:58 PM

## 2017-04-03 NOTE — Plan of Care (Signed)
Patient slept for Estimated Hours of 7.15; Precautionary checks every 15 minutes for safety maintained, room free of safety hazards, patient sustains no injury or falls during this shift.  

## 2017-04-03 NOTE — Progress Notes (Signed)
D: Affect flat  And  Depressed . No ADL's this am shift . Noted to lie  In bed  With  The covers pulled over his head.  MD spoke to patient this am . Patient  In agreement to take his medication  Oral pill form . Patient missed breakfast  Stated no one came to get him and he did not hear the call . Writer went to patient room with encouragement to get his meals . Patient able to do so. . Patient  Denies auditory  Hallucinations , appetite good, no unit programming, isolating to himself in room .Denies depression.  A: Encourage patient participation with unit programming . Instruction  Given on  Medication , needing  Additional help with understanding  verbalize understanding. R: Voice no other concerns. Staff continue to monitor

## 2017-04-03 NOTE — Progress Notes (Signed)
Recreation Therapy Notes  Date: 11.21.2018  Time: 3:00pm  Location: Craft room  Behavioral response: N/A  Group Type: Craft  Participation level: N/A  Communication: N/A  Comments: Patient did not attend group.  Desiree Daise LRT/CTRS           Amirrah Quigley 04/03/2017 4:29 PM 

## 2017-04-03 NOTE — Progress Notes (Signed)
[facility] Progress Note for Medication Administration to Non-consenting Patients (For Involuntarily Committed Patients)  Patient: John Copeland Date of Birth: [id number] MRN: [id number]  Reason for the Medication: The patient, without the benefit of the specific treatment measure, is incapable of participating in any available treatment plan that will give the patient a realistic opportunity of improving the patient's condition. There is, without the benefit of the specific treatment measure, a significant possibility that the patient will harm self or others before improvement of the patient's condition is realized.  Consideration of Side Effects: Consideration of the side effects related to the medication plan has been given.  Rationale for Medication Administration: Patient with chronic psychotic disorder and a history of noncompliance has been refusing appropriate antipsychotic medicine.  Patient has a history of aggression and dangerous behavior and is at significant likelihood to harm others or bringing harm upon himself without appropriate treatment of his psychotic condition.  He is paranoid psychotic and unable to rationally consider the risks and benefits of medicine.  Without medication there is no treatment that is likely to restore him to the ability to function rationally.  Under the circumstances the benefits far outweigh the risks for forced administration of medication.  Case reviewed with primary psychiatrist.  I support the decision.    Mordecai RasmussenJohn Clapacs, MD 04/03/17  12:36 PM   This documentation is good for (7) seven days from the date of the MD signature. New documentation must be completed every seven (7) days with detailed justification in the medical record if the patient requires continued non-emergent administration of psychotropic medications.

## 2017-04-03 NOTE — Progress Notes (Signed)
Recreation Therapy Notes   Date: 11.21.18  Time: 1:00pm  Location: Craft Room  Behavioral response: Appropriate  Intervention Topic: Leisure  Discussion/Intervention: Patient did not attend group.  Clinical Observations/Feedback:  Patient did not attend group.  Wynter Grave LRT/CTRS         Ytzel Gubler 04/03/2017 2:16 PM

## 2017-04-04 DIAGNOSIS — F250 Schizoaffective disorder, bipolar type: Secondary | ICD-10-CM

## 2017-04-04 NOTE — [facility] Group Notes (Signed)
[facility] Group Notes:  (Nursing/MHT/Case Management/Adjunct)  Date:  04/04/2017  Time:  1:31 AM  Type of Therapy:  Group Therapy  Participation Level:  Active  Participation Quality:  Appropriate  Affect:  Appropriate  Cognitive:  Appropriate  Insight:  Good  Engagement in Group:  None  Modes of Intervention:  Support  Summary of Progress/Problems:  Mayra NeerJackie L Dashawn Bartnick 04/04/2017, 1:31 AM

## 2017-04-04 NOTE — Progress Notes (Addendum)
[facility]  04/04/2017 3:09 PM John Copeland  MRN:  [id number]  Subjective:   John Copeland is a [age] year old male with schizophrenia admitted for agitation in the context of noncompliance.  John Copeland understands that nonemergent forced meds would be given if he refuses medications.  He answers in monosyllables and does not really engage in an interview.  Pulled the bed sheet over his head after a minute of me being in the room and asks when he would be discharged.  Treatment plan. We will continue Depakote 500 mg TID and Haldol 10 mg BID po or IM. Patient refuses injectable antipsychotics.  Will touch base with social worker tomorrow on speaking to his guardian about clozapine.  Social/disposition. He is incopmetent adult. It is unclear if he is allowed to return to his group home. Follow up with ACT team.  Principal Problem: Schizoaffective disorder, bipolar type (HCC) Diagnosis:   Patient Active Problem List   Diagnosis Date Noted  . Schizophrenia (HCC) [F20.9] 03/29/2017  . Polydipsia [R63.1] 01/12/2017  . Diphenhydramine overdose of undetermined intent [T45.0X4A] 01/10/2017  . Tobacco use disorder [F17.200] 10/07/2016  . Diabetes (HCC) [E11.9] 10/07/2016  . HTN (hypertension) [I10] 10/07/2016  . Dyslipidemia [E78.5] 10/07/2016  . Schizoaffective disorder, bipolar type (HCC) [F25.0] 11/30/2015   Total Time spent with patient: 20 minutes  Past Psychiatric History: schizphrenia.  Past Medical History:  Past Medical History:  Diagnosis Date  . Anxiety   . Depression   . Diabetes mellitus without complication (HCC)   . Schizophrenia (HCC)    History reviewed. No pertinent surgical history. Family History: History reviewed. No pertinent family history. Family Psychiatric  History: unknown. Social History:  Social History   Substance and Sexual Activity  Alcohol Use No     Social History   Substance and Sexual Activity  Drug Use No    Social History    Socioeconomic History  . Marital status: Single    Spouse name: None  . Number of children: None  . Years of education: None  . Highest education level: None  Social Needs  . Financial resource strain: None  . Food insecurity - worry: None  . Food insecurity - inability: None  . Transportation needs - medical: None  . Transportation needs - non-medical: None  Occupational History  . None  Tobacco Use  . Smoking status: Current Every Day Smoker    Packs/day: 0.50    Types: Cigarettes  . Smokeless tobacco: Never Used  Substance and Sexual Activity  . Alcohol use: No  . Drug use: No  . Sexual activity: None  Other Topics Concern  . None  Social History Narrative  . None   Additional Social History:    Pain Medications: See PTA Prescriptions: See PTA Over the Counter: See PTA History of alcohol / drug use?: No history of alcohol / drug abuse Longest period of sobriety (when/how long): No past or current use of mind altering substances                    Sleep: Fair  Appetite:  Fair  Current Medications: Current Facility-Administered Medications  Medication Dose Route Frequency Provider Last Rate Last Dose  . acetaminophen (TYLENOL) tablet 650 mg  650 mg Oral Q6H PRN Clapacs, John T, MD      . alum & mag hydroxide-simeth (MAALOX/MYLANTA) 200-200-20 MG/5ML suspension 30 mL  30 mL Oral Q4H PRN Clapacs, Jackquline DenmarkJohn T, MD      .  atorvastatin (LIPITOR) tablet 10 mg  10 mg Oral q1800 Clapacs, Jackquline Denmark, MD   10 mg at 04/03/17 1724  . benztropine (COGENTIN) tablet 1 mg  1 mg Oral BID Clapacs, Jackquline Denmark, MD   1 mg at 04/04/17 [phone]  . divalproex (DEPAKOTE) DR tablet 500 mg  500 mg Oral Q8H Pucilowska, Jolanta B, MD   500 mg at 04/04/17 0606  . fluvoxaMINE (LUVOX) tablet 100 mg  100 mg Oral QHS Clapacs, John T, MD   100 mg at 04/03/17 2148  . haloperidol (HALDOL) tablet 10 mg  10 mg Oral BID Pucilowska, Jolanta B, MD   10 mg at 04/04/17 5284   Or  . haloperidol lactate (HALDOL)  injection 10 mg  10 mg Intramuscular BID Pucilowska, Jolanta B, MD      . lisinopril (PRINIVIL,ZESTRIL) tablet 10 mg  10 mg Oral Daily Clapacs, Jackquline Denmark, MD   10 mg at 04/04/17 0831  . magnesium hydroxide (MILK OF MAGNESIA) suspension 30 mL  30 mL Oral Daily PRN Clapacs, John T, MD      . metFORMIN (GLUCOPHAGE) tablet 1,000 mg  1,000 mg Oral BID WC Clapacs, Jackquline Denmark, MD   1,000 mg at 04/04/17 1324  . traZODone (DESYREL) tablet 100 mg  100 mg Oral QHS Clapacs, John T, MD   100 mg at 04/03/17 2148    Lab Results: No results found for this or any previous visit (from the past 48 hour(s)).  Blood Alcohol level:  Lab Results  Component Value Date   ETH <10 03/27/2017   ETH <10 03/16/2017    Metabolic Disorder Labs: Lab Results  Component Value Date   HGBA1C 5.8 (H) 01/19/2017   MPG 119.76 01/19/2017   No results found for: PROLACTIN Lab Results  Component Value Date   CHOL 103 01/19/2017   TRIG 48 01/19/2017   HDL 36 (L) 01/19/2017   CHOLHDL 2.9 01/19/2017   VLDL 10 01/19/2017   LDLCALC 57 01/19/2017    Physical Findings: AIMS: Facial and Oral Movements Muscles of Facial Expression: None, normal Lips and Perioral Area: None, normal Jaw: None, normal Tongue: None, normal,Extremity Movements Upper (arms, wrists, hands, fingers): None, normal Lower (legs, knees, ankles, toes): None, normal, Trunk Movements Neck, shoulders, hips: None, normal, Overall Severity Severity of abnormal movements (highest score from questions above): None, normal Incapacitation due to abnormal movements: None, normal Patient's awareness of abnormal movements (rate only patient's report): No Awareness, Dental Status Current problems with teeth and/or dentures?: No Does patient usually wear dentures?: No  CIWA:    COWS:     Musculoskeletal: Strength & Muscle Tone: within normal limits Gait & Station: normal Patient leans: N/A  Psychiatric Specialty Exam: Physical Exam  Nursing note and vitals  reviewed. Psychiatric: His affect is blunt and inappropriate. His speech is delayed. He is slowed, withdrawn and actively hallucinating. Thought content is paranoid and delusional. Cognition and memory are impaired. He expresses impulsivity.    Review of Systems  Neurological: Negative.   Psychiatric/Behavioral: Positive for hallucinations.  All other systems reviewed and are negative.   Blood pressure 112/64, pulse 64, temperature 98.3 F (36.8 C), temperature source Oral, resp. rate 16, height 6' (1.829 m), weight 173 lb (78.5 kg), SpO2 100 %.Body mass index is 23.46 kg/m.  General Appearance: Casual  Eye Contact:  Fair  Speech:  Blocked  Volume:  Decreased  Mood:  Irritable  Affect:  Blunt  Thought Process:  Disorganized  Orientation:  Full (Time, Place,  and Person)  Thought Content:  Hallucinations: Auditory  Suicidal Thoughts:  No  Homicidal Thoughts:  No  Memory:  Immediate;   Poor Recent;   Poor Remote;   Poor  Judgement:  Poor  Insight:  Lacking  Psychomotor Activity:  Psychomotor Retardation  Concentration:  Concentration: Fair and Attention Span: Poor  Recall:  Poor  Fund of Knowledge:  Poor  Language:  Poor  Akathisia:  No  Handed:  Right  AIMS (if indicated):     Assets:  Manufacturing systems engineerCommunication Skills Physical Health Resilience Social Support  ADL's:  Intact  Cognition:  WNL  Sleep:  Number of Hours: 6.45     Treatment Plan Summary: Daily contact with patient to assess and evaluate symptoms and progress in treatment and Medication management   John Copeland is a 26 year old male with a history of schizoaffective disorder admitted for aggressive behavior at the group home in the context of medication noncompliance. Pt guarded, withdrawn, impulsive.   #Agitation -Resolved  #Mood and psychosis -Continue Haldol 10 mg twice daily, may give IM per NEFM order -Continue Cogentin 1 mg twice daily -Continue Depakote 750 mg twice daily, level pending but patient  refuses labs -Continue Luvox 100 mg nightly -We will touch base with social worker tomorrow about speaking to his guardian about clozapine.  #Diabetes -Continue metformin 1000 mg twice daily  #Dyslipidemia -Continue atorvastatin  #Insomnia -Continue trazodone  #Hypertension -Continue lisinopril  #Constipation -Continue bisacodyl  #Smoking -Nicotine patch is available  #Metabolic syndrome monitoring -Labs were obtained in September 2018 -EKG pending  #Social -He is an incompetent adult [facility]  #Disposition -it is unclear if is allowed to return to his group home -follow up with [facility] team      Aundria RudGopalkumar Raeonna Milo, MD 04/04/2017, 3:09 PM

## 2017-04-04 NOTE — Plan of Care (Signed)
Pt. Slept, "well" as evidence by, pt. Slept over 7 hours last night per sleep reports. Pt. With this writer went over individualized patient education. Pt. Stated he felt better today then he did yesterday. Pt. Mental status progressing as evidence by denial of psych symptoms. Pt. Demonstrates safety on the unit by acting appropriate on the unit.

## 2017-04-04 NOTE — Plan of Care (Signed)
Patient continues to be isolated in his room. Patient is compliant with medications. Patient denies SI, HI and AVH. Patient encouraged to shower and interact with peers during groups. Patient appetite is poor. Nurse will continue to encourage patient to eat meals, express feelings, and educate on coping skills.

## 2017-04-04 NOTE — Progress Notes (Signed)
D:Pt denies SI/HI/AVH. Pt is pleasant and cooperative. Pt. has no Complaints.  Patient upon approach is appropriate and interactive.   A: Q x 15 minute observation checks were completed for safety. Patient was provided with education. Patient was given scheduled medications. Patient  was encourage to attend groups, participate in unit activities and continue with plan of care. Pt. Spent a majority of the evening in his room resting, isolative.  R:Patient is complaint with medication and unit procedures. Pt. Reports eating, "good" and as evidence by recorded percentages.   Patient slept for Estimated Hours of 6.45; Precautionary checks every 15 minutes for safety maintained, room free of safety hazards, patient sustains no injury or falls during this shift. Pt. Asked to complete a urine sample, but was unable to complete this shift.

## 2017-04-05 DIAGNOSIS — F250 Schizoaffective disorder, bipolar type: Secondary | ICD-10-CM

## 2017-04-05 NOTE — Progress Notes (Signed)
[facility] MD Progress Note  04/05/2017 1:34 PM John Copeland  MRN:  [id number]  Subjective:   John Copeland is a 26 year old male with schizophrenia admitted for agitation in the context of noncompliance.  Patient not cooperative with me for an interview today.  Refused to engage in treatment team as well.  Pulled the sheet over his head and refused to speak to me as I entered his room.  Patient has been compliant on his medications and has not needed nonemergent forced meds yesterday.  Spoke with Child psychotherapist about recommending Clozaril to his guardian.  Treatment plan. We will continue Depakote 500 mg TID and Haldol 10 mg BID po or IM. Patient refuses injectable antipsychotics.  Will touch base with social worker tomorrow on speaking to his guardian about clozapine.  Social/disposition. He is incopmetent adult. It is unclear if he is allowed to return to his group home. Follow up with ACT team.  Principal Problem: Schizoaffective disorder, bipolar type (HCC) Diagnosis:   Patient Active Problem List   Diagnosis Date Noted  . Schizophrenia (HCC) [F20.9] 03/29/2017  . Polydipsia [R63.1] 01/12/2017  . Diphenhydramine overdose of undetermined intent [T45.0X4A] 01/10/2017  . Tobacco use disorder [F17.200] 10/07/2016  . Diabetes (HCC) [E11.9] 10/07/2016  . HTN (hypertension) [I10] 10/07/2016  . Dyslipidemia [E78.5] 10/07/2016  . Schizoaffective disorder, bipolar type (HCC) [F25.0] 11/30/2015   Total Time spent with patient: 20 minutes  Past Psychiatric History: schizphrenia.  Past Medical History:  Past Medical History:  Diagnosis Date  . Anxiety   . Depression   . Diabetes mellitus without complication (HCC)   . Schizophrenia (HCC)    History reviewed. No pertinent surgical history. Family History: History reviewed. No pertinent family history. Family Psychiatric  History: unknown. Social History:  Social History   Substance and Sexual Activity  Alcohol Use No     Social  History   Substance and Sexual Activity  Drug Use No    Social History   Socioeconomic History  . Marital status: Single    Spouse name: None  . Number of children: None  . Years of education: None  . Highest education level: None  Social Needs  . Financial resource strain: None  . Food insecurity - worry: None  . Food insecurity - inability: None  . Transportation needs - medical: None  . Transportation needs - non-medical: None  Occupational History  . None  Tobacco Use  . Smoking status: Current Every Day Smoker    Packs/day: 0.50    Types: Cigarettes  . Smokeless tobacco: Never Used  Substance and Sexual Activity  . Alcohol use: No  . Drug use: No  . Sexual activity: None  Other Topics Concern  . None  Social History Narrative  . None   Additional Social History:    Pain Medications: See PTA Prescriptions: See PTA Over the Counter: See PTA History of alcohol / drug use?: No history of alcohol / drug abuse Longest period of sobriety (when/how long): No past or current use of mind altering substances                    Sleep: Fair  Appetite:  Fair  Current Medications: Current Facility-Administered Medications  Medication Dose Route Frequency Provider Last Rate Last Dose  . acetaminophen (TYLENOL) tablet 650 mg  650 mg Oral Q6H PRN John Copeland, John T, MD      . alum & mag hydroxide-simeth (MAALOX/MYLANTA) 200-200-20 MG/5ML suspension 30 mL  30 mL Oral  Q4H PRN John Copeland, John T, MD      . atorvastatin (LIPITOR) tablet 10 mg  10 mg Oral q1800 John Copeland, John DenmarkJohn T, MD   10 mg at 04/04/17 1704  . benztropine (COGENTIN) tablet 1 mg  1 mg Oral BID John Copeland, John T, MD   1 mg at 04/05/17 0900  . divalproex (DEPAKOTE) DR tablet 500 mg  500 mg Oral Q8H John Copeland, John B, MD   500 mg at 04/05/17 1142  . fluvoxaMINE (LUVOX) tablet 100 mg  100 mg Oral QHS John Copeland, John T, MD   100 mg at 04/04/17 2149  . haloperidol (HALDOL) tablet 10 mg  10 mg Oral BID John Copeland,  John B, MD   10 mg at 04/05/17 1141   Or  . haloperidol lactate (HALDOL) injection 10 mg  10 mg Intramuscular BID John Copeland, John B, MD      . lisinopril (PRINIVIL,ZESTRIL) tablet 10 mg  10 mg Oral Daily John Copeland, John T, MD   10 mg at 04/05/17 0900  . magnesium hydroxide (MILK OF MAGNESIA) suspension 30 mL  30 mL Oral Daily PRN John Copeland, John T, MD      . metFORMIN (GLUCOPHAGE) tablet 1,000 mg  1,000 mg Oral BID WC John Copeland, John T, MD   1,000 mg at 04/05/17 1121  . traZODone (DESYREL) tablet 100 mg  100 mg Oral QHS John Copeland, John T, MD   100 mg at 04/04/17 2150    Lab Results: No results found for this or any previous visit (from the past 48 hour(s)).  Blood Alcohol level:  Lab Results  Component Value Date   ETH <10 03/27/2017   ETH <10 03/16/2017    Metabolic Disorder Labs: Lab Results  Component Value Date   HGBA1C 5.8 (H) 01/19/2017   MPG 119.76 01/19/2017   No results found for: PROLACTIN Lab Results  Component Value Date   CHOL 103 01/19/2017   TRIG 48 01/19/2017   HDL 36 (L) 01/19/2017   CHOLHDL 2.9 01/19/2017   VLDL 10 01/19/2017   LDLCALC 57 01/19/2017    Physical Findings: AIMS: Facial and Oral Movements Muscles of Facial Expression: None, normal Lips and Perioral Area: None, normal Jaw: None, normal Tongue: None, normal,Extremity Movements Upper (arms, wrists, hands, fingers): None, normal Lower (legs, knees, ankles, toes): None, normal, Trunk Movements Neck, shoulders, hips: None, normal, Overall Severity Severity of abnormal movements (highest score from questions above): None, normal Incapacitation due to abnormal movements: None, normal Patient's awareness of abnormal movements (rate only patient's report): No Awareness, Dental Status Current problems with teeth and/or dentures?: No Does patient usually wear dentures?: No  CIWA:    COWS:     Musculoskeletal: Strength & Muscle Tone: within normal limits Gait & Station: normal Patient leans:  N/A  Psychiatric Specialty Exam: Physical Exam  Nursing note and vitals reviewed. Psychiatric: His affect is blunt and inappropriate. His speech is delayed. He is slowed, withdrawn and actively hallucinating. Thought content is paranoid and delusional. Cognition and memory are impaired. He expresses impulsivity.    Review of Systems  Neurological: Negative.   Psychiatric/Behavioral: Positive for hallucinations.  All other systems reviewed and are negative.   Blood pressure 118/70, pulse 64, temperature 98.3 F (36.8 C), temperature source Oral, resp. rate 16, height 6' (1.829 m), weight 173 lb (78.5 kg), SpO2 100 %.Body mass index is 23.46 kg/m.  General Appearance: Casual  Eye Contact:  Fair  Speech:  Blocked  Volume:  Decreased  Mood:  Irritable  Affect:  Blunt  Thought Process:  Disorganized  Orientation:  Full (Time, Place, and Person)  Thought Content:  Hallucinations: Auditory  Suicidal Thoughts:  No  Homicidal Thoughts:  No  Memory:  Immediate;   Poor Recent;   Poor Remote;   Poor  Judgement:  Poor  Insight:  Lacking  Psychomotor Activity:  Psychomotor Retardation  Concentration:  Concentration: Fair and Attention Span: Poor  Recall:  Poor  Fund of Knowledge:  Poor  Language:  Poor  Akathisia:  No  Handed:  Right  AIMS (if indicated):     Assets:  Manufacturing systems engineerCommunication Skills Physical Health Resilience Social Support  ADL's:  Intact  Cognition:  WNL  Sleep:  Number of Hours: 7.15     Treatment Plan Summary: Daily contact with patient to assess and evaluate symptoms and progress in treatment and Medication management   [name] is a 26 year old male with a history of schizoaffective disorder admitted for aggressive behavior at the group home in the context of medication noncompliance.  Patient continues to be guarded and refused to engage in an interview.  Has been compliant with his medications without needing nonemergent.  Meds.  #Mood and  psychosis -Continue Haldol 10 mg twice daily, may give IM per [facility] order -Continue Cogentin 1 mg twice daily -Continue Depakote 750 mg twice daily, level pending but patient refuses labs -Continue Luvox 100 mg nightly -Child psychotherapistocial worker will touch base with his guardian about initiating Clozaril  #Diabetes -Continue metformin 1000 mg twice daily  #Dyslipidemia -Continue atorvastatin  #Insomnia -Continue trazodone  #Hypertension -Continue lisinopril  #Constipation -Continue bisacodyl  #Smoking -Nicotine patch is available  #Metabolic syndrome monitoring -Labs were obtained in September 2018 -EKG pending  #Social -He is an incompetent adult [facility]  #Disposition -it is unclear if is allowed to return to his group home -follow up with [facility] ACT team      Aundria RudGopalkumar Marixa Mellott, MD 04/05/2017, 1:34 PM

## 2017-04-05 NOTE — Progress Notes (Signed)
Recreation Therapy Notes   Date: 11.23.18  Time: 1:00 pm  Location: Craft Room  Behavioral response: N/A  Intervention Topic: Communication   Discussion/Intervention: Patient did not attend group. Clinical Observations/Feedback:  Patient did not attend group. [name] LRT/CTRS         John Copeland 04/05/2017 1:57 PM 

## 2017-04-05 NOTE — Progress Notes (Signed)
Patient isolative, flat, and depressed.  After prompting from staff, patient did agree to come to dayroom to eat lunch and dinner.  Patient interaction with staff and others was minimal.  No distress noted.

## 2017-04-05 NOTE — [facility] Group Notes (Signed)
[facility] Group Notes:  (Nursing/MHT/Case Management/Adjunct)  Date:  04/05/2017  Time:  9:27 PM  Type of Therapy:  Group Therapy  Participation Level:  Minimal  Participation Quality:  walking in and out of group.  Affect:  Appropriate  Cognitive:  Appropriate  Insight:  Good  Engagement in Group:  Lacking  Modes of Intervention:  Support  Summary of Progress/Problems:  John Copeland 04/05/2017, 9:27 PM

## 2017-04-05 NOTE — [facility] Group Notes (Signed)
[facility] Group Notes:  (Nursing/MHT/Case Management/Adjunct)  Date:  04/05/2017  Time:  6:26 PM  Type of Therapy:  Psychoeducational Skills  Participation Level:  Did Not Attend  Shiya Fogelman 04/05/2017, 6:26 PM   

## 2017-04-05 NOTE — Tx Team (Signed)
Interdisciplinary Treatment and Diagnostic Plan Update  04/05/2017 Time of Session: [place] MRN: [id number]  Principal Diagnosis: Schizoaffective disorder, bipolar type [facility])  Secondary Diagnoses: Principal Problem:   Schizoaffective disorder, bipolar type ([place]) Active Problems:   Tobacco use disorder   Diabetes ([place])   HTN (hypertension)   Dyslipidemia   Current Medications:  Current Facility-Administered Medications  Medication Dose Route Frequency Provider Last Rate Last Dose  . acetaminophen (TYLENOL) tablet 650 mg  650 mg Oral Q6H PRN Clapacs, John T, MD      . alum & mag hydroxide-simeth (MAALOX/MYLANTA) 200-200-20 MG/5ML suspension 30 mL  30 mL Oral Q4H PRN Clapacs, John T, MD      . atorvastatin (LIPITOR) tablet 10 mg  10 mg Oral q1800 Clapacs, Madie Reno, MD   10 mg at 04/04/17 1704  . benztropine (COGENTIN) tablet 1 mg  1 mg Oral BID Clapacs, Madie Reno, MD   1 mg at 04/04/17 1704  . divalproex (DEPAKOTE) DR tablet 500 mg  500 mg Oral Q8H Pucilowska, Jolanta B, MD   500 mg at 04/05/17 0608  . fluvoxaMINE (LUVOX) tablet 100 mg  100 mg Oral QHS Clapacs, John T, MD   100 mg at 04/04/17 2149  . haloperidol (HALDOL) tablet 10 mg  10 mg Oral BID Pucilowska, Jolanta B, MD   10 mg at 04/04/17 1836   Or  . haloperidol lactate (HALDOL) injection 10 mg  10 mg Intramuscular BID Pucilowska, Jolanta B, MD      . lisinopril (PRINIVIL,ZESTRIL) tablet 10 mg  10 mg Oral Daily Clapacs, Madie Reno, MD   10 mg at 04/04/17 0831  . magnesium hydroxide (MILK OF MAGNESIA) suspension 30 mL  30 mL Oral Daily PRN Clapacs, John T, MD      . metFORMIN (GLUCOPHAGE) tablet 1,000 mg  1,000 mg Oral BID WC Clapacs, John T, MD   1,000 mg at 04/04/17 1704  . traZODone (DESYREL) tablet 100 mg  100 mg Oral QHS Clapacs, John T, MD   100 mg at 04/04/17 2150   PTA Medications: Medications Prior to Admission  Medication Sig Dispense Refill Last Dose  . atorvastatin (LIPITOR) 10 MG tablet Take 1 tablet (10  mg total) by mouth daily at 6 PM. 30 tablet 1   . benztropine (COGENTIN) 1 MG tablet Take 1 tablet (1 mg total) by mouth daily. 30 tablet 1   . diphenhydrAMINE (BENADRYL) 50 MG capsule Take 1 capsule (50 mg total) by mouth at bedtime. 30 capsule 0 Past Month at Unknown time  . divalproex (DEPAKOTE ER) 250 MG 24 hr tablet Take 3 tablets (750 mg total) by mouth 2 (two) times daily at 8 am and 10 pm. 180 tablet 1   . fluvoxaMINE (LUVOX) 100 MG tablet Take 1 tablet (100 mg total) by mouth at bedtime. 30 tablet 1   . haloperidol (HALDOL) 10 MG tablet Take 1 tablet (10 mg total) by mouth 2 (two) times daily. 60 tablet 1   . lisinopril (PRINIVIL,ZESTRIL) 10 MG tablet Take 1 tablet (10 mg total) by mouth daily. 30 tablet 1   . metFORMIN (GLUCOPHAGE) 1000 MG tablet Take 1 tablet (1,000 mg total) by mouth 2 (two) times daily with a meal. 60 tablet 1   . Multiple Vitamin (MULTIVITAMIN WITH MINERALS) TABS tablet Take 1 tablet by mouth daily. 30 tablet 1   . traZODone (DESYREL) 100 MG tablet Take 1 tablet (100 mg total) by mouth at bedtime. 30 tablet 0  Patient Stressors: Educational concerns Medication change or noncompliance  Patient Strengths: Average or above average intelligence Communication skills  Treatment Modalities: Medication Management, Group therapy, Case management,  1 to 1 session with clinician, Psychoeducation, Recreational therapy.   Physician Treatment Plan for Primary Diagnosis: Schizoaffective disorder, bipolar type ([place]) Long Term Goal(s): Improvement in symptoms so as ready for discharge NA   Short Term Goals: Ability to identify changes in lifestyle to reduce recurrence of condition will improve Ability to verbalize feelings will improve Ability to disclose and discuss suicidal ideas Ability to demonstrate self-control will improve Ability to identify and develop effective coping behaviors will improve Ability to maintain clinical measurements within normal limits will  improve Compliance with prescribed medications will improve Ability to identify triggers associated with substance abuse/mental health issues will improve NA  Medication Management: Evaluate patient's response, side effects, and tolerance of medication regimen.  Therapeutic Interventions: 1 to 1 sessions, Unit Group sessions and Medication administration.  Evaluation of Outcomes: Not Met  Physician Treatment Plan for Secondary Diagnosis: Principal Problem:   Schizoaffective disorder, bipolar type ([place]) Active Problems:   Tobacco use disorder   Diabetes ([place])   HTN (hypertension)   Dyslipidemia  Long Term Goal(s): Improvement in symptoms so as ready for discharge NA   Short Term Goals: Ability to identify changes in lifestyle to reduce recurrence of condition will improve Ability to verbalize feelings will improve Ability to disclose and discuss suicidal ideas Ability to demonstrate self-control will improve Ability to identify and develop effective coping behaviors will improve Ability to maintain clinical measurements within normal limits will improve Compliance with prescribed medications will improve Ability to identify triggers associated with substance abuse/mental health issues will improve NA     Medication Management: Evaluate patient's response, side effects, and tolerance of medication regimen.  Therapeutic Interventions: 1 to 1 sessions, Unit Group sessions and Medication administration.  Evaluation of Outcomes: Not Met   RN Treatment Plan for Primary Diagnosis: Schizoaffective disorder, bipolar type ([place]) Long Term Goal(s): Knowledge of disease and therapeutic regimen to maintain health will improve  Short Term Goals: Ability to verbalize feelings will improve, Ability to identify and develop effective coping behaviors will improve and Compliance with prescribed medications will improve  Medication Management: RN will administer medications as ordered by  provider, will assess and evaluate patient's response and provide education to patient for prescribed medication. RN will report any adverse and/or side effects to prescribing provider.  Therapeutic Interventions: 1 on 1 counseling sessions, Psychoeducation, Medication administration, Evaluate responses to treatment, Monitor vital signs and CBGs as ordered, Perform/monitor CIWA, COWS, AIMS and Fall Risk screenings as ordered, Perform wound care treatments as ordered.  Evaluation of Outcomes: Not Met   LCSW Treatment Plan for Primary Diagnosis: Schizoaffective disorder, bipolar type ([place]) Long Term Goal(s): Safe transition to appropriate next level of care at discharge, Engage patient in therapeutic group addressing interpersonal concerns.  Short Term Goals: Engage patient in aftercare planning with referrals and resources, Increase social support and Increase skills for wellness and recovery  Therapeutic Interventions: Assess for all discharge needs, 1 to 1 time with Social worker, Explore available resources and support systems, Assess for adequacy in community support network, Educate family and significant other(s) on suicide prevention, Complete Psychosocial Assessment, Interpersonal group therapy.  Evaluation of Outcomes: Not Met   Progress in Treatment: Attending groups: No. Participating in groups: No. Taking medication as prescribed: Yes. Toleration medication: Yes. Family/Significant other contact made: Yes, with pt's guardian.  Patient understands  diagnosis: No. Discussing patient identified problems/goals with staff: No. Medical problems stabilized or resolved: Yes. Denies suicidal/homicidal ideation: Yes. Issues/concerns per patient self-inventory: No. Other: none  New problem(s) identified: No, Describe:  none  New Short Term/Long Term Goal(s):Pt unable to attend meeting today.  Discharge Plan or Barriers: Pt will return to ACT team services.  Reason for Continuation  of Hospitalization: Medication stabilization  Estimated Length of Stay: 5-7 days.   Attendees: Patient: 04/05/2017   Physician: Dr. Davonna Belling, MD 04/05/2017   Nursing: Nash Mantis, RN 04/05/2017   RN Care Manager:  04/05/2017   Social Worker: Alden Hipp, LCSW 04/05/2017   Recreational Therapist: Roanna Epley, LRT/CRTS 04/05/2017   Other: Derrek Gu, LCSW 04/05/2017   Other: Lurline Idol, LCSW 04/05/2017   Other: 04/05/2017      Scribe for Treatment Team: Alden Hipp, LCSW 04/05/2017 11:03 AM

## 2017-04-05 NOTE — Progress Notes (Signed)
Was visible in the milieu, appears more animated. Came up to get medications tonight when asked but left and went to bed. Denies SI/HI or AVH. Was med compliant. Smiled briefly.  Remains on routine obs for safety.

## 2017-04-05 NOTE — [facility] Group Notes (Signed)
[facility] LCSW Group Therapy Note  Date/Time: 04/05/17, 0930  Type of Therapy and Topic:  Group Therapy:  Feelings around Relapse and Recovery  Participation Level:  Did Not Attend   Mood:  Description of Group:    Patients in this group will discuss emotions they experience before and after a relapse. They will process how experiencing these feelings, or avoidance of experiencing them, relates to having a relapse. Facilitator will guide patients to explore emotions they have related to recovery. Patients will be encouraged to process which emotions are more powerful. They will be guided to discuss the emotional reaction significant others in their lives may have to patients' relapse or recovery. Patients will be assisted in exploring ways to respond to the emotions of others without this contributing to a relapse.  Therapeutic Goals: 1. Patient will identify two or more emotions that lead to relapse for them:  2. Patient will identify two emotions that result when they relapse:  3. Patient will identify two emotions related to recovery:  4. Patient will demonstrate ability to communicate their needs through discussion and/or role plays.   Summary of Patient Progress:     Therapeutic Modalities:   Cognitive Behavioral Therapy Solution-Focused Therapy Assertiveness Training Relapse Prevention Therapy  Greg Chudney Scheffler, LCSW       

## 2017-04-06 DIAGNOSIS — F250 Schizoaffective disorder, bipolar type: Secondary | ICD-10-CM

## 2017-04-06 NOTE — Plan of Care (Signed)
  Activity: Interest or engagement in activities will improve 04/06/2017 1549 - Not Progressing by Criss AlvineNjeru, Dinesha Twiggs W, RN Patient did not attend 1300 group and remained in his room mostly.   Health Behavior/Discharge Planning: Compliance with treatment plan for underlying cause of condition will improve 04/06/2017 1549 - Not Progressing by Criss AlvineNjeru, Nolia Tschantz W, RN Patient refused to come out for his medications and writer had to take them to his room. He required lots of encouragement to take his meds.

## 2017-04-06 NOTE — [facility] Group Notes (Signed)
04/06/2017 1:15pm  Type of Therapy and Topic: Group Therapy: Feelings Around Returning Home & Establishing a Supportive Framework and Supporting Oneself When Supports Not Available  Participation Level: Did Not Attend    John SimaNNIA  CUEBAS-COLON, LCSWA 04/06/2017 12:50 PM

## 2017-04-06 NOTE — [facility] Group Notes (Signed)
[facility] Group Notes:  (Nursing/MHT/Case Management/Adjunct)  Date:  04/06/2017  Time:  10:25 PM  Type of Therapy:  Psychoeducational Skills  Participation Level:  None   Wilson SingerJustin  Arianis Bowditch 04/06/2017, 10:25 PM

## 2017-04-06 NOTE — Progress Notes (Signed)
[facility] MD Progress Note  04/06/2017 4:21 PM John Copeland  MRN:  [id number]  Subjective:   John Copeland is a 26 year old male with schizophrenia admitted for agitation in the context of noncompliance.  Patient engaged in a conversation with me today.  He had a detailed discussion about his medications and whether he would be willing for an injectable depot preparation.  Endorses that he does not like injectable antipsychotics and is okay with taking the oral medication.  Feels positive that he can go back to [facility] Which was the facility he was at before he got admitted to the hospital.  Denies any symptoms suggestive of auditory or visual hallucinations currently.  Asks me when he can be discharged from the hospital.  Has also been seen to engage with other patients in the day room.  Has not exhibited any aggression, or abnormal behavior including responding to internal stimuli for the last 2 days. Treatment plan. We will continue Depakote 500 mg TID and Haldol 10 mg BID po or IM. Patient refuses injectable antipsychotics.    Social/disposition. He is incopmetent adult. It is unclear if he is allowed to return to his group home. Follow up with ACT team.  Principal Problem: Schizoaffective disorder, bipolar type (HCC) Diagnosis:   Patient Active Problem List   Diagnosis Date Noted  . Schizophrenia (HCC) [F20.9] 03/29/2017  . Polydipsia [R63.1] 01/12/2017  . Diphenhydramine overdose of undetermined intent [T45.0X4A] 01/10/2017  . Tobacco use disorder [F17.200] 10/07/2016  . Diabetes (HCC) [E11.9] 10/07/2016  . HTN (hypertension) [I10] 10/07/2016  . Dyslipidemia [E78.5] 10/07/2016  . Schizoaffective disorder, bipolar type (HCC) [F25.0] 11/30/2015   Total Time spent with patient: 30 minutes  Past Psychiatric History: schizphrenia.  Past Medical History:  Past Medical History:  Diagnosis Date  . Anxiety   . Depression   . Diabetes mellitus without complication (HCC)   .  Schizophrenia (HCC)    History reviewed. No pertinent surgical history. Family History: History reviewed. No pertinent family history. Family Psychiatric  History: unknown. Social History:  Social History   Substance and Sexual Activity  Alcohol Use No     Social History   Substance and Sexual Activity  Drug Use No    Social History   Socioeconomic History  . Marital status: Single    Spouse name: None  . Number of children: None  . Years of education: None  . Highest education level: None  Social Needs  . Financial resource strain: None  . Food insecurity - worry: None  . Food insecurity - inability: None  . Transportation needs - medical: None  . Transportation needs - non-medical: None  Occupational History  . None  Tobacco Use  . Smoking status: Current Every Day Smoker    Packs/day: 0.50    Types: Cigarettes  . Smokeless tobacco: Never Used  Substance and Sexual Activity  . Alcohol use: No  . Drug use: No  . Sexual activity: None  Other Topics Concern  . None  Social History Narrative  . None   Additional Social History:    Pain Medications: See PTA Prescriptions: See PTA Over the Counter: See PTA History of alcohol / drug use?: No history of alcohol / drug abuse Longest period of sobriety (when/how long): No past or current use of mind altering substances                    Sleep: Fair  Appetite:  Fair  Current Medications: Current Facility-Administered Medications  Medication Dose Route Frequency Provider Last Rate Last Dose  . acetaminophen (TYLENOL) tablet 650 mg  650 mg Oral Q6H PRN Clapacs, John T, MD      . alum & mag hydroxide-simeth (MAALOX/MYLANTA) 200-200-20 MG/5ML suspension 30 mL  30 mL Oral Q4H PRN Clapacs, John T, MD      . atorvastatin (LIPITOR) tablet 10 mg  10 mg Oral q1800 Clapacs, John T, MD   10 mg at 04/05/17 1712  . benztropine (COGENTIN) tablet 1 mg  1 mg Oral BID Clapacs, Jackquline DenmarkJohn T, MD   1 mg at 04/06/17 0934  .  divalproex (DEPAKOTE) DR tablet 500 mg  500 mg Oral Q8H Pucilowska, Jolanta B, MD   500 mg at 04/06/17 1426  . fluvoxaMINE (LUVOX) tablet 100 mg  100 mg Oral QHS Clapacs, John T, MD   100 mg at 04/05/17 2210  . haloperidol (HALDOL) tablet 10 mg  10 mg Oral BID Pucilowska, Jolanta B, MD   10 mg at 04/06/17 0941   Or  . haloperidol lactate (HALDOL) injection 10 mg  10 mg Intramuscular BID Pucilowska, Jolanta B, MD      . lisinopril (PRINIVIL,ZESTRIL) tablet 10 mg  10 mg Oral Daily Clapacs, Jackquline DenmarkJohn T, MD   10 mg at 04/06/17 0933  . magnesium hydroxide (MILK OF MAGNESIA) suspension 30 mL  30 mL Oral Daily PRN Clapacs, John T, MD      . metFORMIN (GLUCOPHAGE) tablet 1,000 mg  1,000 mg Oral BID WC Clapacs, Jackquline DenmarkJohn T, MD   1,000 mg at 04/06/17 0933  . traZODone (DESYREL) tablet 100 mg  100 mg Oral QHS Clapacs, John T, MD   100 mg at 04/05/17 2210    Lab Results: No results found for this or any previous visit (from the past 48 hour(s)).  Blood Alcohol level:  Lab Results  Component Value Date   ETH <10 03/27/2017   ETH <10 03/16/2017    Metabolic Disorder Labs: Lab Results  Component Value Date   HGBA1C 5.8 (H) 01/19/2017   MPG 119.76 01/19/2017   No results found for: PROLACTIN Lab Results  Component Value Date   CHOL 103 01/19/2017   TRIG 48 01/19/2017   HDL 36 (L) 01/19/2017   CHOLHDL 2.9 01/19/2017   VLDL 10 01/19/2017   LDLCALC 57 01/19/2017    Physical Findings: AIMS: Facial and Oral Movements Muscles of Facial Expression: None, normal Lips and Perioral Area: None, normal Jaw: None, normal Tongue: None, normal,Extremity Movements Upper (arms, wrists, hands, fingers): None, normal Lower (legs, knees, ankles, toes): None, normal, Trunk Movements Neck, shoulders, hips: None, normal, Overall Severity Severity of abnormal movements (highest score from questions above): None, normal Incapacitation due to abnormal movements: None, normal Patient's awareness of abnormal movements  (rate only patient's report): No Awareness, Dental Status Current problems with teeth and/or dentures?: No Does patient usually wear dentures?: No  CIWA:    COWS:     Musculoskeletal: Strength & Muscle Tone: within normal limits Gait & Station: normal Patient leans: N/A  Psychiatric Specialty Exam: Physical Exam  Nursing note and vitals reviewed. Psychiatric: His affect is blunt and inappropriate. His speech is delayed. He is slowed, withdrawn and actively hallucinating. Thought content is paranoid and delusional. Cognition and memory are impaired. He expresses impulsivity.    Review of Systems  Neurological: Negative.   Psychiatric/Behavioral: Positive for hallucinations.  All other systems reviewed and are negative.   Blood pressure 118/70, pulse 64, temperature 98.3 F (36.8 C), temperature  source Oral, resp. rate 16, height 6' (1.829 m), weight 173 lb (78.5 kg), SpO2 100 %.Body mass index is 23.46 kg/m.  General Appearance: Casual  Eye Contact:  Fair  Speech:  Blocked  Volume:  Decreased  Mood: Improving  Affect: Blunted  Thought Process:  Disorganized  Orientation:  Full (Time, Place, and Person)  Thought Content:  Hallucinations: Auditory  Suicidal Thoughts:  No  Homicidal Thoughts:  No  Memory:  Immediate;   Poor Recent;   Poor Remote;   Poor  Judgement: Improving  Insight:  Lacking  Psychomotor Activity: Normal  Concentration:  Concentration: Fair and Attention Span: Poor  Recall:  Poor  Fund of Knowledge:  Poor  Language:  Poor  Akathisia:  No  Handed:  Right  AIMS (if indicated):     Assets:  Manufacturing systems engineerCommunication Skills Physical Health Resilience Social Support  ADL's:  Intact  Cognition:  WNL  Sleep:  Number of Hours: 6.3     Treatment Plan Summary: Daily contact with patient to assess and evaluate symptoms and progress in treatment and Medication management   John Copeland is a 26 year old male with a history of schizoaffective disorder admitted for  aggressive behavior at the group home in the context of medication noncompliance.  Patient continues to be guarded and refused to engage in an interview.  Has been compliant with his medications without needing nonemergent.  Although initially refusing to engage in an interview, patient has progressively improved in his ability to engage in conversations and talk about her symptoms.  Although we consider Clozaril in the beginning, it seems like the Haldol he is taking orally might be benefiting him.  Affect continues to be blunted and insight not great.  But judgment seems to be improving.  #Mood and psychosis -Continue Haldol 10 mg twice daily, may give IM per NEFM order -Continue Cogentin 1 mg twice daily -Continue Depakote 750 mg twice daily, level pending but patient refuses labs -Continue Luvox 100 mg nightly -Primary team can take up the question of whether he needs Clozaril initiation.  #Diabetes -Continue metformin 1000 mg twice daily  #Dyslipidemia -Continue atorvastatin  #Insomnia -Continue trazodone  #Hypertension -Continue lisinopril  #Constipation -Continue bisacodyl  #Smoking -Nicotine patch is available  #Metabolic syndrome monitoring -Labs were obtained in September 2018 -EKG pending  #Social -[place] is an incompetent adult [place]  #Disposition -it is unclear if is allowed to return to his group home -follow up with [facility] ACT team      Aundria RudGopalkumar Juanito Gonyer, MD 04/06/2017, 4:21 PM

## 2017-04-06 NOTE — Progress Notes (Signed)
Visible in the dayroom, asocial. Ate snack. States he feels like he is ready to return to his group home. Was a bit more verbal. Was med compliant. Remains on routine obs for safety.

## 2017-04-07 DIAGNOSIS — F250 Schizoaffective disorder, bipolar type: Secondary | ICD-10-CM

## 2017-04-07 NOTE — Plan of Care (Signed)
  Activity: Sleeping patterns will improve 04/07/2017 1838 - Not Progressing by Angela AdamBeaudry, Zayvion Stailey E, RN   Education: Knowledge of [facility] Education information/materials will improve 04/07/2017 1838 - Not Progressing by Angela AdamBeaudry, Nelani Schmelzle E, RN   Education: Emotional status will improve 04/07/2017 1838 - Not Progressing by Angela AdamBeaudry, Nichael Ehly E, RN

## 2017-04-07 NOTE — Progress Notes (Signed)
[facility] MD Progress Note  04/07/2017 11:38 AM John Copeland  MRN:  [id number]  Subjective:   John Copeland is a 26 year old male with schizophrenia admitted for agitation in the context of noncompliance on oral antipsychotic medications.  04/07/17-patient continues to be interactive and engaging in conversation with me.  Also seen to be outside in the day room while taking meals.  Patient less guarded than before.  Not seem to be responding to internal stimuli. 04/06/17- Patient engaged in a conversation with me today.  He had a detailed discussion about his medications and whether he would be willing for an injectable depot preparation.  Endorses that he does not like injectable antipsychotics and is okay with taking the oral medication.  Feels positive that he can go back to [facility] Which was the facility he was at before he got admitted to the hospital.  Denies any symptoms suggestive of auditory or visual hallucinations currently.  Asks me when he can be discharged from the hospital.  Has also been seen to engage with other patients in the day room.  Has not exhibited any aggression, or abnormal behavior including responding to internal stimuli for the last 2 days. Treatment plan. We will continue Depakote 500 mg TID and Haldol 10 mg BID po or IM. Patient refuses injectable antipsychotics.    Social/disposition. He is incopmetent adult. It is unclear if he is allowed to return to his group home. Follow up with ACT team.  Principal Problem: Schizoaffective disorder, bipolar type (HCC) Diagnosis:   Patient Active Problem List   Diagnosis Date Noted  . Schizophrenia (HCC) [F20.9] 03/29/2017  . Polydipsia [R63.1] 01/12/2017  . Diphenhydramine overdose of undetermined intent [T45.0X4A] 01/10/2017  . Tobacco use disorder [F17.200] 10/07/2016  . Diabetes (HCC) [E11.9] 10/07/2016  . HTN (hypertension) [I10] 10/07/2016  . Dyslipidemia [E78.5] 10/07/2016  . Schizoaffective disorder, bipolar  type (HCC) [F25.0] 11/30/2015   Total Time spent with patient: 30 minutes  Past Psychiatric History: schizphrenia.  Past Medical History:  Past Medical History:  Diagnosis Date  . Anxiety   . Depression   . Diabetes mellitus without complication (HCC)   . Schizophrenia (HCC)    History reviewed. No pertinent surgical history. Family History: History reviewed. No pertinent family history. Family Psychiatric  History: unknown. Social History:  Social History   Substance and Sexual Activity  Alcohol Use No     Social History   Substance and Sexual Activity  Drug Use No    Social History   Socioeconomic History  . Marital status: Single    Spouse name: None  . Number of children: None  . Years of education: None  . Highest education level: None  Social Needs  . Financial resource strain: None  . Food insecurity - worry: None  . Food insecurity - inability: None  . Transportation needs - medical: None  . Transportation needs - non-medical: None  Occupational History  . None  Tobacco Use  . Smoking status: Current Every Day Smoker    Packs/day: 0.50    Types: Cigarettes  . Smokeless tobacco: Never Used  Substance and Sexual Activity  . Alcohol use: No  . Drug use: No  . Sexual activity: None  Other Topics Concern  . None  Social History Narrative  . None   Additional Social History:    Pain Medications: See PTA Prescriptions: See PTA Over the Counter: See PTA History of alcohol / drug use?: No history of alcohol / drug abuse Longest period  of sobriety (when/how long): No past or current use of mind altering substances                    Sleep: Fair  Appetite:  Fair  Current Medications: Current Facility-Administered Medications  Medication Dose Route Frequency Provider Last Rate Last Dose  . acetaminophen (TYLENOL) tablet 650 mg  650 mg Oral Q6H PRN Clapacs, John T, MD      . alum & mag hydroxide-simeth (MAALOX/MYLANTA) 200-200-20 MG/5ML  suspension 30 mL  30 mL Oral Q4H PRN Clapacs, Jackquline DenmarkJohn T, MD   30 mL at 04/06/17 2037  . atorvastatin (LIPITOR) tablet 10 mg  10 mg Oral q1800 Clapacs, Jackquline DenmarkJohn T, MD   10 mg at 04/06/17 1718  . benztropine (COGENTIN) tablet 1 mg  1 mg Oral BID Clapacs, Jackquline DenmarkJohn T, MD   1 mg at 04/06/17 1718  . divalproex (DEPAKOTE) DR tablet 500 mg  500 mg Oral Q8H Pucilowska, Jolanta B, MD   500 mg at 04/07/17 0552  . fluvoxaMINE (LUVOX) tablet 100 mg  100 mg Oral QHS Clapacs, John T, MD   100 mg at 04/06/17 2115  . haloperidol (HALDOL) tablet 10 mg  10 mg Oral BID Pucilowska, Jolanta B, MD   10 mg at 04/06/17 1718   Or  . haloperidol lactate (HALDOL) injection 10 mg  10 mg Intramuscular BID Pucilowska, Jolanta B, MD      . lisinopril (PRINIVIL,ZESTRIL) tablet 10 mg  10 mg Oral Daily Clapacs, Jackquline DenmarkJohn T, MD   10 mg at 04/06/17 0933  . magnesium hydroxide (MILK OF MAGNESIA) suspension 30 mL  30 mL Oral Daily PRN Clapacs, John T, MD      . metFORMIN (GLUCOPHAGE) tablet 1,000 mg  1,000 mg Oral BID WC Clapacs, Jackquline DenmarkJohn T, MD   1,000 mg at 04/06/17 1718  . traZODone (DESYREL) tablet 100 mg  100 mg Oral QHS Clapacs, Jackquline DenmarkJohn T, MD   100 mg at 04/06/17 2115    Lab Results: No results found for this or any previous visit (from the past 48 hour(s)).  Blood Alcohol level:  Lab Results  Component Value Date   ETH <10 03/27/2017   ETH <10 03/16/2017    Metabolic Disorder Labs: Lab Results  Component Value Date   HGBA1C 5.8 (H) 01/19/2017   MPG 119.76 01/19/2017   No results found for: PROLACTIN Lab Results  Component Value Date   CHOL 103 01/19/2017   TRIG 48 01/19/2017   HDL 36 (L) 01/19/2017   CHOLHDL 2.9 01/19/2017   VLDL 10 01/19/2017   LDLCALC 57 01/19/2017    Physical Findings: AIMS: Facial and Oral Movements Muscles of Facial Expression: None, normal Lips and Perioral Area: None, normal Jaw: None, normal Tongue: None, normal,Extremity Movements Upper (arms, wrists, hands, fingers): None, normal Lower (legs,  knees, ankles, toes): None, normal, Trunk Movements Neck, shoulders, hips: None, normal, Overall Severity Severity of abnormal movements (highest score from questions above): None, normal Incapacitation due to abnormal movements: None, normal Patient's awareness of abnormal movements (rate only patient's report): No Awareness, Dental Status Current problems with teeth and/or dentures?: No Does patient usually wear dentures?: No  CIWA:    COWS:     Musculoskeletal: Strength & Muscle Tone: within normal limits Gait & Station: normal Patient leans: N/A  Psychiatric Specialty Exam: Physical Exam  Nursing note and vitals reviewed. Psychiatric: His affect is blunt. His speech is not delayed. He is withdrawn. Thought content is delusional.  Review of Systems  Neurological: Negative.   All other systems reviewed and are negative.   Blood pressure 118/70, pulse 64, temperature 98.3 F (36.8 C), temperature source Oral, resp. rate 16, height 6' (1.829 m), weight 173 lb (78.5 kg), SpO2 100 %.Body mass index is 23.46 kg/m.  General Appearance: Casual  Eye Contact:  Fair  Speech: Normal  Volume:  Decreased  Mood: Improving  Affect: Blunted  Thought Process:  Disorganized  Orientation:  Full (Time, Place, and Person)  Thought Content:  Hallucinations: Auditory  Suicidal Thoughts:  No  Homicidal Thoughts:  No  Memory:  Immediate;   Poor Recent;   Poor Remote;   Poor  Judgement: Improving  Insight:  Lacking  Psychomotor Activity: Normal  Concentration:  Concentration: Fair and Attention Span: Poor  Recall:  Poor  Fund of Knowledge:  Poor  Language:  Poor  Akathisia:  No  Handed:  Right  AIMS (if indicated):     Assets:  Manufacturing systems engineerCommunication Skills Physical Health Resilience Social Support  ADL's:  Intact  Cognition:  WNL  Sleep:  Number of Hours: 8.15     Treatment Plan Summary: Daily contact with patient to assess and evaluate symptoms and progress in treatment and  Medication management   John Copeland is a 26 year old male with a history of schizoaffective disorder admitted for aggressive behavior at the group home in the context of medication noncompliance.  Patient continues to be guarded and refused to engage in an interview.  Has been compliant with his medications without needing nonemergent.  Although initially refusing to engage in an interview, patient has progressively improved in his ability to engage in conversations and talk about her symptoms.  Although we consider Clozaril in the beginning, it seems like the Haldol he is taking orally might be benefiting him.  Affect continues to be blunted and insight not great.  But judgment seems to be improving.  #Mood and psychosis -Continue Haldol 10 mg twice daily, may give IM per NEFM order -Continue Cogentin 1 mg twice daily -Continue Depakote 750 mg twice daily, level pending but patient refuses labs -Continue Luvox 100 mg nightly -Week day team can take up the question of whether he needs Clozaril initiation.  #Diabetes -Continue metformin 1000 mg twice daily  #Dyslipidemia -Continue atorvastatin  #Insomnia -Continue trazodone  #Hypertension -Continue lisinopril  #Constipation -Continue bisacodyl  #Smoking -Nicotine patch is available  #Metabolic syndrome monitoring -Labs were obtained in September 2018 -EKG pending  #Social [place]  #Disposition -it is unclear if is allowed to return to his group home -follow up with [facility] ACT team      Aundria RudGopalkumar Oral Remache, MD 04/07/2017, 11:38 AM

## 2017-04-07 NOTE — Progress Notes (Signed)
Isolative to his room but out for snack. Came to the med room door and asked for his night time meds.Some spontaneous smiles, answered in short sentences. Denies all psych symptoms. Asleep at this time, remains on routine obs for safety.

## 2017-04-07 NOTE — [facility] Group Notes (Signed)
04/07/2017 1:15pm  Type of Therapy and Topic: Group Therapy: Holding on to Grudges   Participation Level: Did Not Attend   Description of Group:  In this group patients will be asked to explore and define a grudge. Patients will be guided to discuss their thoughts, feelings, and reasons as to why people have grudges. Patients will process the impact grudges have on daily life and identify thoughts and feelings related to holding grudges. Facilitator will challenge patients to identify ways to let go of grudges and the benefits this provides. Patients will be confronted to address why one struggles letting go of grudges. Lastly, patients will identify feelings and thoughts related to what life would look like without grudges. This group will be process-oriented, with patients participating in exploration of their own experiences, giving and receiving support, and processing challenge from other group members.  Therapeutic Goals:  1. Patient will identify specific grudges related to their personal life.  2. Patient will identify feelings, thoughts, and beliefs around grudges.  3. Patient will identify how one releases grudges appropriately.  4. Patient will identify situations where they could have let go of the grudge, but instead chose to hold on.   Summary of Patient Progress:   Therapeutic Modalities:  Cognitive Behavioral Therapy  Solution Focused Therapy  Motivational Interviewing  Brief Therapy   Lorren Rossetti  CUEBAS-COLON, LCSWA 04/07/2017 11:39 AM

## 2017-04-07 NOTE — Progress Notes (Signed)
D: Patient has been lying in bed majority of the day and has not interacted with staff at all.  He has been asked several times to come for his medications and will not answer staff.  Patient will open his eyes when he is spoken to, however, refuses to answer any questions.  Patient is observed lying in bed looking out the window when he is checked on.  Cannot assess whether he is suicidal or hearing voices as he will not answer any questions. A: Continue to monitor medication management and MD orders.  Safety checks continued every 15 minutes per protocol.  Offer support and encouragement as needed. R: Patient is mute with staff and isolates to his room continuously.

## 2017-04-08 ENCOUNTER — Inpatient Hospital Stay: Payer: Medicaid Other

## 2017-04-08 DIAGNOSIS — Z5181 Encounter for therapeutic drug level monitoring: Secondary | ICD-10-CM

## 2017-04-08 MED ORDER — ARIPIPRAZOLE 10 MG PO TABS
10.0000 mg | ORAL_TABLET | Freq: Every day | ORAL | Status: DC
  Administered 2017-04-08 – 2017-04-09 (×2): 10 mg via ORAL
  Filled 2017-04-08 (×3): qty 1

## 2017-04-08 NOTE — Progress Notes (Signed)
Isolative to his room, came out for snack. Very minimally verbal tonight. Denies all psych symptoms. Came to the med room for meds. Remains on routine obs for safety.

## 2017-04-08 NOTE — Progress Notes (Signed)
Recreation Therapy Notes  Date: 11.26.18    Time: 1:00pm    Location: Craft Room    Behavioral response:  N/A   Intervention Topic: Values    Discussion/Intervention:  Patient did not attend group.   Clinical Observations/Feedback:   Patient did not attend group.   Sherra Kimmons LRT/CTRS            John Copeland 04/08/2017 1:52 PM 

## 2017-04-08 NOTE — Plan of Care (Signed)
Patient stayed in bed except for medicine and meals.Patient verbalized "I am so sleepy I don't feel like getting up".Patient engaging in more conversation with staff.Denies SI,HI and AVH.Appetite and energy level fair.Refused labs even with much encouragement.Compliant with medications.

## 2017-04-08 NOTE — [place] FL2 (Signed)
[place] MEDICAID FL2 LEVEL OF CARE SCREENING TOOL     IDENTIFICATION  Patient Name: John Copeland Birthdate: January 15, 1991 Sex: male Admission Date (Current Location): 03/29/2017  [place]  [place] [id number] [facility] Facility and Address:  [facility], [address], [place], [place] [address]      Provider Number: [id number]  Attending Physician Name and Address:  Shari ProwsPucilowska, Jolanta B, MD  Relative Name and Phone Number:  [name] [phone]    Current Level of Care: Hospital Recommended Level of Care: Family Care Home Prior Approval Number:    Date Approved/Denied:   PASRR Number:    Discharge Plan: Domiciliary (Rest home)    Current Diagnoses: Patient Active Problem List   Diagnosis Date Noted  . Schizophrenia (HCC) 03/29/2017  . 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 Diphenhydramine overdose of undetermined intent 01/10/2017  . Tobacco use disorder 10/07/2016  . Diabetes (HCC) 10/07/2016  . HTN (hypertension) 10/07/2016  . Dyslipidemia 10/07/2016  . Schizoaffective disorder, bipolar type (HCC) 11/30/2015    Orientation RESPIRATION BLADDER Height & Weight     Self, Situation, Time, Place  Normal Continent Weight: 173 lb (78.5 kg) Height:  6' (182.9 cm)  BEHAVIORAL SYMPTOMS/MOOD NEUROLOGICAL BOWEL NUTRITION STATUS  (N/A) (N/A) Continent Diet(Diabetic)  AMBULATORY STATUS COMMUNICATION OF NEEDS Skin   Independent Verbally Normal                       Personal Care Assistance Level of Assistance  (N/A)           Functional Limitations Info  (N/A)          SPECIAL CARE FACTORS FREQUENCY  (N/A)                    Contractures Contractures Info: Not present    Additional Factors Info  (N/A)               Current Medications (04/08/2017):  This is the current hospital active medication list Current Facility-Administered Medications  Medication Dose Route Frequency Provider Last Rate Last  Dose  . acetaminophen (TYLENOL) tablet 650 mg  650 mg Oral Q6H PRN Clapacs, John T, MD      . alum & mag hydroxide-simeth (MAALOX/MYLANTA) 200-200-20 MG/5ML suspension 30 mL  30 mL Oral Q4H PRN Clapacs, Jackquline DenmarkJohn T, MD   30 mL at 04/06/17 2037  . atorvastatin (LIPITOR) tablet 10 mg  10 mg Oral q1800 Clapacs, Jackquline DenmarkJohn T, MD   10 mg at 04/07/17 1654  . benztropine (COGENTIN) tablet 1 mg  1 mg Oral BID Clapacs, Jackquline DenmarkJohn T, MD   1 mg at 04/08/17 0836  . divalproex (DEPAKOTE) DR tablet 500 mg  500 mg Oral Q8H Pucilowska, Jolanta B, MD   500 mg at 04/08/17 0553  . fluvoxaMINE (LUVOX) tablet 100 mg  100 mg Oral QHS Clapacs, John T, MD   100 mg at 04/07/17 2150  . haloperidol (HALDOL) tablet 10 mg  10 mg Oral BID Pucilowska, Jolanta B, MD   10 mg at 04/08/17 0836   Or  . haloperidol lactate (HALDOL) injection 10 mg  10 mg Intramuscular BID Pucilowska, Jolanta B, MD      . lisinopril (PRINIVIL,ZESTRIL) tablet 10 mg  10 mg Oral Daily Clapacs, Jackquline DenmarkJohn T, MD   10 mg at 04/08/17 0836  . magnesium hydroxide (MILK OF MAGNESIA) suspension 30 mL  30 mL Oral Daily PRN Clapacs, Jackquline DenmarkJohn T, MD      .  metFORMIN (GLUCOPHAGE) tablet 1,000 mg  1,000 mg Oral BID WC Clapacs, Jackquline DenmarkJohn T, MD   1,000 mg at 04/08/17 0836  . traZODone (DESYREL) tablet 100 mg  100 mg Oral QHS Clapacs, Jackquline DenmarkJohn T, MD   100 mg at 04/07/17 2150     Discharge Medications: Please see discharge summary for a list of discharge medications.  Relevant Imaging Results:  Relevant Lab Results:   Additional Information None.   Heidi DachKelsey Diara Chaudhari, LCSW

## 2017-04-08 NOTE — Discharge Instructions (Signed)
Antibiotic Medicine, Adult  Antibiotic medicines treat infections caused by a type of germ called bacteria. They work by killing the bacteria that make you sick.  When do I need to take antibiotics?  You often need these medicines to treat bacterial infections, such as:  · A urinary tract infection (UTI).  · Strep throat.  · Meningitis. This affects the spinal cord and brain.  · A bad lung infection.    You may start the medicines while your doctor waits for tests to come back. When the tests come back, your doctor may change or stop your medicine.  When are antibiotics not needed?  You do not need these medicines for most common illnesses, such as:  · A cold.  · The flu.  · A sore throat.    Antibiotics are not always needed for all infections caused by bacteria. Do not ask for these medicines, or take them, when they are not needed.  What are the risks of taking antibiotics?  Most antibiotics can cause an infection called Clostridium difficile.This causes watery poop (diarrhea). Let your doctor know right away if:  · You have watery poop while taking an antibiotic.  · You have watery poop after you stop taking an antibiotic. The illness can happen weeks after you stop the medicine.    You also have a risk of getting an infection in the future that antibiotics cannot treat (antibiotic-resistant infection). This type of infection can be dangerous.  What else should I know about taking antibiotics?  · You need to take the entire prescription.  ? Take the medicine for as long as told by your doctor.  ? Do not stop taking it even if you start to feel better.  · Try not to miss any doses. If you miss a dose, call your doctor.  · Birth control pills may not work. If you take birth control pills:  ? Keep on taking them.  ? Use a second form of birth control, such as a condom. Do this for as long as told by your doctor.  · Ask your doctor:  ? How long to wait in between doses.  ? If you should take the medicine with  food.  ? If there is anything you should stay away from while taking the antibiotic, such as:  ? Food.  ? Drinks.  ? Medicines.  ? If there are any side effects you should watch for.  · Only take the medicines that your doctor told you to take. Do not take medicines that were given to someone else.  · Drink a large glass of water with the medicine.  · Ask the pharmacist for a tool to measure the medicine, such as:  ? A syringe.  ? A cup.  ? A spoon.  · Throw away any extra medicine.  Contact a doctor if:  · You get worse.  · You have new joint pain or muscle aches after starting the medicine.  · You have side effects from the medicine, such as:  ? Stomach pain.  ? Watery poop.  ? Feeling sick to your stomach (nausea).  Get help right away if:  · You have signs of a very bad allergic reaction. If this happens, stop taking the medicine right away. Signs may include:  ? Hives. These are raised, itchy, red bumps on the skin.  ? Skin rash.  ? Trouble breathing.  ? Wheezing.  ? Swelling.  ? Feeling dizzy.  ? Throwing up (  vomiting).  · Your pee (urine) is dark, or is the color of blood.  · Your skin turns yellow.  · You bruise easily.  · You bleed easily.  · You have very bad watery poop and cramps in your belly.  · You have a very bad headache.  Summary  · Antibiotics are often used to treat infections caused by bacteria.  · Only take these medicines when needed.  · Let your doctor know if you have watery poop while taking an antibiotic.  · You need to take the entire prescription.  This information is not intended to replace advice given to you by your health care provider. Make sure you discuss any questions you have with your health care provider.  Document Released: 02/07/2008 Document Revised: 05/02/2016 Document Reviewed: 05/02/2016  Elsevier Interactive Patient Education © 2017 Elsevier Inc.

## 2017-04-08 NOTE — Progress Notes (Signed)
Patient ID: John Copeland, male   DOB: 03/24/1991, 26 y.o.   MRN: [id number]   CSW contacted [facility] at [phone], to obtain information regarding pt's ability to return upon discharge. CSW spoke with Mrs. Freida BusmanAllen who reported, "He's no longer here and she is not taking him back." Mrs. Freida Busmanllen was unable to provide further information but stated that pt is no longer able to reside at [facility]. CSW will follow up as needed.   CSW attempted to contact pt's guardian, [name] [phone]), to discuss placement needs for pt. Pt's guardian did not answer, so CSW left a voicemail asking him to call back ASAP. CSW will follow up.   Heidi DachKelsey Maritza [place], MSW, LCSW 04/08/2017 9:10 AM

## 2017-04-08 NOTE — Progress Notes (Addendum)
[facility] MD Progress Note  04/08/2017 2:43 PM Daric Koren  MRN:  [id number]  Subjective:  Mr. Bradburn is in bed today with his head covered but looks at me during the interview. He understands that he will be going to a new group home and is in agreement. This group home was arranged by his ACT team and approved by his guardian. He agrees to chest X-ray and oral Abilify. We were hoping to transition him to Abilify maintena injections.  Treatment plan. We will continue Haldol and Depakote for psychosis and mood stabilization. We will offer Abilify.  Social/disposition. He will be discharged to a new group home tomorrow. Follow up with ACT team.   Principal Problem: Schizoaffective disorder, bipolar type (HCC) Diagnosis:   Patient Active Problem List   Diagnosis Date Noted  . Schizophrenia (HCC) [F20.9] 03/29/2017  . Polydipsia [R63.1] 01/12/2017  . Diphenhydramine overdose of undetermined intent [T45.0X4A] 01/10/2017  . Tobacco use disorder [F17.200] 10/07/2016  . Diabetes (HCC) [E11.9] 10/07/2016  . HTN (hypertension) [I10] 10/07/2016  . Dyslipidemia [E78.5] 10/07/2016  . Schizoaffective disorder, bipolar type (HCC) [F25.0] 11/30/2015   Total Time spent with patient: 20 minutes  Past Psychiatric History: schizophrenia.  Past Medical History:  Past Medical History:  Diagnosis Date  . Anxiety   . Depression   . Diabetes mellitus without complication (HCC)   . Schizophrenia (HCC)    History reviewed. No pertinent surgical history. Family History: History reviewed. No pertinent family history. Family Psychiatric  History: unknown. Social History:  Social History   Substance and Sexual Activity  Alcohol Use No     Social History   Substance and Sexual Activity  Drug Use No    Social History   Socioeconomic History  . Marital status: Single    Spouse name: None  . Number of children: None  . Years of education: None  . Highest education level: None  Social Needs   . Financial resource strain: None  . Food insecurity - worry: None  . Food insecurity - inability: None  . Transportation needs - medical: None  . Transportation needs - non-medical: None  Occupational History  . None  Tobacco Use  . Smoking status: Current Every Day Smoker    Packs/day: 0.50    Types: Cigarettes  . Smokeless tobacco: Never Used  Substance and Sexual Activity  . Alcohol use: No  . Drug use: No  . Sexual activity: None  Other Topics Concern  . None  Social History Narrative  . None   Additional Social History:    Pain Medications: See PTA Prescriptions: See PTA Over the Counter: See PTA History of alcohol / drug use?: No history of alcohol / drug abuse Longest period of sobriety (when/how long): No past or current use of mind altering substances                    Sleep: Fair  Appetite:  Fair  Current Medications: Current Facility-Administered Medications  Medication Dose Route Frequency Provider Last Rate Last Dose  . acetaminophen (TYLENOL) tablet 650 mg  650 mg Oral Q6H PRN Clapacs, John T, MD      . alum & mag hydroxide-simeth (MAALOX/MYLANTA) 200-200-20 MG/5ML suspension 30 mL  30 mL Oral Q4H PRN Clapacs, Jackquline Denmark, MD   30 mL at 04/06/17 2037  . ARIPiprazole (ABILIFY) tablet 10 mg  10 mg Oral Daily Stefana Lodico B, MD   10 mg at 04/08/17 1203  . atorvastatin (LIPITOR) tablet  10 mg  10 mg Oral q1800 Clapacs, Jackquline DenmarkJohn T, MD   10 mg at 04/07/17 1654  . benztropine (COGENTIN) tablet 1 mg  1 mg Oral BID Clapacs, Jackquline DenmarkJohn T, MD   1 mg at 04/08/17 0836  . divalproex (DEPAKOTE) DR tablet 500 mg  500 mg Oral Q8H Chael Urenda B, MD   500 mg at 04/08/17 1409  . fluvoxaMINE (LUVOX) tablet 100 mg  100 mg Oral QHS Clapacs, John T, MD   100 mg at 04/07/17 2150  . haloperidol (HALDOL) tablet 10 mg  10 mg Oral BID Saria Haran B, MD   10 mg at 04/08/17 0836   Or  . haloperidol lactate (HALDOL) injection 10 mg  10 mg Intramuscular BID Pearlena Ow,  Cailen Texeira B, MD      . lisinopril (PRINIVIL,ZESTRIL) tablet 10 mg  10 mg Oral Daily Clapacs, Jackquline DenmarkJohn T, MD   10 mg at 04/08/17 0836  . magnesium hydroxide (MILK OF MAGNESIA) suspension 30 mL  30 mL Oral Daily PRN Clapacs, John T, MD      . metFORMIN (GLUCOPHAGE) tablet 1,000 mg  1,000 mg Oral BID WC Clapacs, Jackquline DenmarkJohn T, MD   1,000 mg at 04/08/17 0836  . traZODone (DESYREL) tablet 100 mg  100 mg Oral QHS Clapacs, John T, MD   100 mg at 04/07/17 2150    Lab Results: No results found for this or any previous visit (from the past 48 hour(s)).  Blood Alcohol level:  Lab Results  Component Value Date   ETH <10 03/27/2017   ETH <10 03/16/2017    Metabolic Disorder Labs: Lab Results  Component Value Date   HGBA1C 5.8 (H) 01/19/2017   MPG 119.76 01/19/2017   No results found for: PROLACTIN Lab Results  Component Value Date   CHOL 103 01/19/2017   TRIG 48 01/19/2017   HDL 36 (L) 01/19/2017   CHOLHDL 2.9 01/19/2017   VLDL 10 01/19/2017   LDLCALC 57 01/19/2017    Physical Findings: AIMS: Facial and Oral Movements Muscles of Facial Expression: None, normal Lips and Perioral Area: None, normal Jaw: None, normal Tongue: None, normal,Extremity Movements Upper (arms, wrists, hands, fingers): None, normal Lower (legs, knees, ankles, toes): None, normal, Trunk Movements Neck, shoulders, hips: None, normal, Overall Severity Severity of abnormal movements (highest score from questions above): None, normal Incapacitation due to abnormal movements: None, normal Patient's awareness of abnormal movements (rate only patient's report): No Awareness, Dental Status Current problems with teeth and/or dentures?: No Does patient usually wear dentures?: No  CIWA:    COWS:     Musculoskeletal: Strength & Muscle Tone: within normal limits Gait & Station: normal Patient leans: N/A  Psychiatric Specialty Exam: Physical Exam  Nursing note and vitals reviewed. Psychiatric: Thought content normal. His  affect is blunt. His speech is delayed. He is slowed and withdrawn. Cognition and memory are impaired. He expresses impulsivity.    Review of Systems  Neurological: Negative.   Psychiatric/Behavioral: Negative.   All other systems reviewed and are negative.   Blood pressure 118/70, pulse 64, temperature 98.3 F (36.8 C), temperature source Oral, resp. rate 16, height 6' (1.829 m), weight 78.5 kg (173 lb), SpO2 100 %.Body mass index is 23.46 kg/m.  General Appearance: Casual  Eye Contact:  Minimal  Speech:  Blocked  Volume:  Decreased  Mood:  Euthymic  Affect:  Blunt  Thought Process:  Goal Directed and Descriptions of Associations: Intact  Orientation:  Full (Time, Place, and Person)  Thought Content:  WDL  Suicidal Thoughts:  No  Homicidal Thoughts:  No  Memory:  Immediate;   Fair Recent;   Fair Remote;   Fair  Judgement:  Poor  Insight:  Lacking  Psychomotor Activity:  Psychomotor Retardation  Concentration:  Concentration: Fair and Attention Span: Fair  Recall:  [employer]Fair  Fund of Knowledge:  Fair  Language:  Fair  Akathisia:  No  Handed:  Right  AIMS (if indicated):     Assets:  Communication Skills Desire for Improvement Financial Resources/Insurance Housing Physical Health Resilience Social Support  ADL's:  Intact  Cognition:  WNL  Sleep:  Number of Hours: 7.3     Treatment Plan Summary: Daily contact with patient to assess and evaluate symptoms and progress in treatment and Medication management   [name] is a 26 year old male with a history of schizoaffective disorder admitted for aggressive behavior at the group home in the context of medication noncompliance.  Patient continues to be guarded. He has been compliant with his medications without needing nonemergent.  Although initially refusing to engage in an interview, patient has progressively improved in his ability to engage in conversations and talk about his symptoms.  Although we consider Clozaril in  the beginning, it seems like the Haldol he is taking orally might be benefiting him.  Affect continues to be blunted and insight not great.  But judgment seems to be improving.  #Mood and psychosis -Continue Haldol 10 mg twice daily, may give IM per NEFM order -Continue Cogentin 1 mg twice daily -Continue Depakote 750 mg twice daily, he continues to refuse labs -Continue Luvox 100 mg nightly -start oral Abilify 10 mg daily and offer Abilify maintena  #Diabetes -Continue metformin 1000 mg twice daily  #Dyslipidemia -Continue atorvastatin  #Insomnia -Continue trazodone  #Hypertension -Continue lisinopril  #Constipation -Continue bisacodyl  #Smoking -Nicotine patch is available  #Metabolic syndrome monitoring -Labs were obtained in September 2018 -EKG pending  #Social -[place]  #R/O TB -chest X-ray is negative  #Disposition -he will be discharged to a new group home -follow up with [facility] ACT team       Kristine LineaJolanta Raihana Balderrama, MD 04/08/2017, 2:43 PM

## 2017-04-08 NOTE — [facility] Group Notes (Signed)
[facility] Group Notes:  (Nursing/MHT/Case Management/Adjunct)  Date:  04/08/2017  Time:  3:03 PM  Type of Therapy:  Psychoeducational Skills  Participation Level:  Did Not Attend  Lynelle SmokeCara Travis [facility] 04/08/2017, 3:03 PM

## 2017-04-08 NOTE — [facility] Group Notes (Signed)
LCSW Group Therapy Note   04/08/2017 9:30am   Type of Therapy and Topic:  Group Therapy:  Overcoming Obstacles   Participation Level:  Did Not Attend   Description of Group:    In this group patients will be encouraged to explore what they see as obstacles to their own wellness and recovery. They will be guided to discuss their thoughts, feelings, and behaviors related to these obstacles. The group will process together ways to cope with barriers, with attention given to specific choices patients can make. Each patient will be challenged to identify changes they are motivated to make in order to overcome their obstacles. This group will be process-oriented, with patients participating in exploration of their own experiences as well as giving and receiving support and challenge from other group members.   Therapeutic Goals: 1. Patient will identify personal and current obstacles as they relate to admission. 2. Patient will identify barriers that currently interfere with their wellness or overcoming obstacles.  3. Patient will identify feelings, thought process and behaviors related to these barriers. 4. Patient will identify two changes they are willing to make to overcome these obstacles:      Summary of Patient Progress      Therapeutic Modalities:   Cognitive Behavioral Therapy Solution Focused Therapy Motivational Interviewing Relapse Prevention Therapy  Glennon MacSara P Cassius Cullinane, LCSW 04/08/2017 4:32 PM

## 2017-04-09 LAB — AMMONIA: AMMONIA: 68 umol/L — AB (ref 9–35)

## 2017-04-09 LAB — VALPROIC ACID LEVEL: VALPROIC ACID LVL: 78 ug/mL (ref 50.0–100.0)

## 2017-04-09 MED ORDER — ARIPIPRAZOLE ER 400 MG IM SRER
400.0000 mg | INTRAMUSCULAR | Status: DC
  Filled 2017-04-09: qty 400

## 2017-04-09 MED ORDER — LACTULOSE 10 GM/15ML PO SOLN
30.0000 g | Freq: Once | ORAL | Status: DC
  Filled 2017-04-09: qty 60

## 2017-04-09 NOTE — Progress Notes (Signed)
Recreation Therapy Notes   Date: 11.27.18  Time: 1:00 pm  Location: Craft Room  Behavioral response: N/A  Intervention Topic: Self- esteem  Discussion/Intervention: Patient did not attend group.   Clinical Observations/Feedback:  Patient did not attend group. Raja Liska LRT/CTRS         Dink Creps 04/09/2017 2:26 PM

## 2017-04-09 NOTE — Progress Notes (Signed)
[facility]  04/09/2017 2:35 PM Charmian MuffJoshua Omar Humble  MRN:  [id number]  Subjective:   [name] feels "better" today and is more awake and interactive. He agreed to labs today after several days of refusal. We discovered that while his VPA level is 70, his ammonia is elevated. He has been taking oral abilify.He does not engage with peers or staff. He does not participate in programming.  Treatment plan. We will continue oral Haldol 10 mg BID and increase Abilify to 20 mg. I will hold Depakote today and restart tomorrow at a lower dose. Recheck level in am. Lactulose is offered. The patient refuses Abilify maintena shot as it "hurts". Switching to injectable Abilify was the ACT team plan to improve compliance.  Social/disposition. He will be discharged to a new group home. Follow up with ACT team. Guardian is in agreement.  Principal Problem: Schizoaffective disorder, bipolar type (HCC) Diagnosis:   Patient Active Problem List   Diagnosis Date Noted  . Schizophrenia (HCC) [F20.9] 03/29/2017  . Polydipsia [R63.1] 01/12/2017  . Diphenhydramine overdose of undetermined intent [T45.0X4A] 01/10/2017  . Tobacco use disorder [F17.200] 10/07/2016  . Diabetes (HCC) [E11.9] 10/07/2016  . HTN (hypertension) [I10] 10/07/2016  . Dyslipidemia [E78.5] 10/07/2016  . Schizoaffective disorder, bipolar type (HCC) [F25.0] 11/30/2015   Total Time spent with patient: 20 minutes  Past Psychiatric History: schizophrenia  Past Medical History:  Past Medical History:  Diagnosis Date  . Anxiety   . Depression   . Diabetes mellitus without complication (HCC)   . Schizophrenia (HCC)    History reviewed. No pertinent surgical history. Family History: History reviewed. No pertinent family history. Family Psychiatric  History: unknown Social History:  Social History   Substance and Sexual Activity  Alcohol Use No     Social History   Substance and Sexual Activity  Drug Use No    Social  History   Socioeconomic History  . Marital status: Single    Spouse name: None  . Number of children: None  . Years of education: None  . Highest education level: None  Social Needs  . Financial resource strain: None  . Food insecurity - worry: None  . Food insecurity - inability: None  . Transportation needs - medical: None  . Transportation needs - non-medical: None  Occupational History  . None  Tobacco Use  . Smoking status: Current Every Day Smoker    Packs/day: 0.50    Types: Cigarettes  . Smokeless tobacco: Never Used  Substance and Sexual Activity  . Alcohol use: No  . Drug use: No  . Sexual activity: None  Other Topics Concern  . None  Social History Narrative  . None   Additional Social History:    Pain Medications: See PTA Prescriptions: See PTA Over the Counter: See PTA History of alcohol / drug use?: No history of alcohol / drug abuse Longest period of sobriety (when/how long): No past or current use of mind altering substances                    Sleep: Fair  Appetite:  Fair  Current Medications: Current Facility-Administered Medications  Medication Dose Route Frequency Provider Last Rate Last Dose  . acetaminophen (TYLENOL) tablet 650 mg  650 mg Oral Q6H PRN Clapacs, John T, MD      . alum & mag hydroxide-simeth (MAALOX/MYLANTA) 200-200-20 MG/5ML suspension 30 mL  30 mL Oral Q4H PRN Clapacs, Jackquline DenmarkJohn T, MD   30 mL at  04/06/17 2037  . ARIPiprazole (ABILIFY) tablet 10 mg  10 mg Oral Daily Shemeka Wardle B, MD   10 mg at 04/09/17 0809  . ARIPiprazole ER SRER 400 mg  400 mg Intramuscular Q28 days Shanon Becvar B, MD      . atorvastatin (LIPITOR) tablet 10 mg  10 mg Oral q1800 Clapacs, Jackquline DenmarkJohn T, MD   10 mg at 04/08/17 1642  . benztropine (COGENTIN) tablet 1 mg  1 mg Oral BID Clapacs, Jackquline DenmarkJohn T, MD   1 mg at 04/09/17 0806  . fluvoxaMINE (LUVOX) tablet 100 mg  100 mg Oral QHS Clapacs, John T, MD   100 mg at 04/08/17 2122  . haloperidol (HALDOL)  tablet 10 mg  10 mg Oral BID Champagne Paletta B, MD   10 mg at 04/09/17 0806   Or  . haloperidol lactate (HALDOL) injection 10 mg  10 mg Intramuscular BID Taariq Leitz B, MD      . lactulose (CHRONULAC) 10 GM/15ML solution 30 g  30 g Oral Once Camala Talwar B, MD      . lisinopril (PRINIVIL,ZESTRIL) tablet 10 mg  10 mg Oral Daily Clapacs, Jackquline DenmarkJohn T, MD   10 mg at 04/08/17 0836  . magnesium hydroxide (MILK OF MAGNESIA) suspension 30 mL  30 mL Oral Daily PRN Clapacs, John T, MD      . metFORMIN (GLUCOPHAGE) tablet 1,000 mg  1,000 mg Oral BID WC Clapacs, Jackquline DenmarkJohn T, MD   1,000 mg at 04/09/17 0808  . traZODone (DESYREL) tablet 100 mg  100 mg Oral QHS Clapacs, Jackquline DenmarkJohn T, MD   100 mg at 04/08/17 2122    Lab Results:  Results for orders placed or performed during the hospital encounter of 03/29/17 (from the past 48 hour(s))  Ammonia     Status: Abnormal   Collection Time: 04/09/17 10:55 AM  Result Value Ref Range   Ammonia 68 (H) 9 - 35 umol/L  Valproic acid level     Status: None   Collection Time: 04/09/17 10:55 AM  Result Value Ref Range   Valproic Acid Lvl 78 50.0 - 100.0 ug/mL    Blood Alcohol level:  Lab Results  Component Value Date   ETH <10 03/27/2017   ETH <10 03/16/2017    Metabolic Disorder Labs: Lab Results  Component Value Date   HGBA1C 5.8 (H) 01/19/2017   MPG 119.76 01/19/2017   No results found for: PROLACTIN Lab Results  Component Value Date   CHOL 103 01/19/2017   TRIG 48 01/19/2017   HDL 36 (L) 01/19/2017   CHOLHDL 2.9 01/19/2017   VLDL 10 01/19/2017   LDLCALC 57 01/19/2017    Physical Findings: AIMS: Facial and Oral Movements Muscles of Facial Expression: None, normal Lips and Perioral Area: None, normal Jaw: None, normal Tongue: None, normal,Extremity Movements Upper (arms, wrists, hands, fingers): None, normal Lower (legs, knees, ankles, toes): None, normal, Trunk Movements Neck, shoulders, hips: None, normal, Overall Severity Severity of  abnormal movements (highest score from questions above): None, normal Incapacitation due to abnormal movements: None, normal Patient's awareness of abnormal movements (rate only patient's report): No Awareness, Dental Status Current problems with teeth and/or dentures?: No Does patient usually wear dentures?: No  CIWA:    COWS:     Musculoskeletal: Strength & Muscle Tone: within normal limits Gait & Station: normal Patient leans: N/A  Psychiatric Specialty Exam: Physical Exam  Nursing note and vitals reviewed. Psychiatric: Thought content normal. His affect is blunt. His speech is delayed. He  is slowed and withdrawn. Cognition and memory are normal. He expresses impulsivity.    Review of Systems  Neurological: Negative.   Psychiatric/Behavioral: Negative.   All other systems reviewed and are negative.   Blood pressure (!) 108/58, pulse (!) 56, temperature 98.1 F (36.7 C), temperature source Oral, resp. rate 16, height 6' (1.829 m), weight 78.5 kg (173 lb), SpO2 100 %.Body mass index is 23.46 kg/m.  General Appearance: Casual  Eye Contact:  Good  Speech:  Slow  Volume:  Normal  Mood:  Euthymic  Affect:  Blunt  Thought Process:  Goal Directed and Descriptions of Associations: Intact  Orientation:  Full (Time, Place, and Person)  Thought Content:  WDL  Suicidal Thoughts:  No  Homicidal Thoughts:  No  Memory:  Immediate;   Fair Recent;   Fair Remote;   Fair  Judgement:  Poor  Insight:  Lacking  Psychomotor Activity:  Decreased  Concentration:  Concentration: Fair and Attention Span: Fair  Recall:  [employer] of Knowledge:  Fair  Language:  Fair  Akathisia:  No  Handed:  Right  AIMS (if indicated):     Assets:  Communication Skills Desire for Improvement Financial Resources/Insurance Housing Physical Health Resilience Social Support  ADL's:  Intact  Cognition:  WNL  Sleep:  Number of Hours: 7.3     Treatment Plan Summary: Daily contact with patient to  assess and evaluate symptoms and progress in treatment and Medication management   Mr. Helsley is a 26 year old male with a history of schizoaffective disorder admitted for aggressive behavior at the group home in the context of medication noncompliance. Patient continues to be guarded. He has been compliant with his medications. Affect continues to be blunted and insight not great.   #Mood and psychosis, improved -Continue Haldol 10 mg twice daily -Continue Cogentin 1 mg twice daily -Hold Depakote, VPA level 78, ammonia 68, level in am -give Lactulose 30 ml -Continue Luvox 100 mg nightly -increase Abilify to 20 mg daily  -offer Abilify maintena 400 mg  #Diabetes -Continue metformin 1000 mg twice daily  #Dyslipidemia -Continue atorvastatin  #Insomnia -Continue trazodone  #Hypertension -Continue lisinopril  #Constipation -Continue bisacodyl  #Smoking -Nicotine patch is available  #Metabolic syndrome monitoring -Labs were obtained in September 2018 -EKG, QTc 420  #Social -[facility] is the guardian  #R/O TB -chest X-ray is negative  #Disposition -he will be discharged to a new group home -follow up with [facility] ACT team     Kristine Linea, MD 04/09/2017, 2:35 PM

## 2017-04-09 NOTE — [facility] Group Notes (Signed)
[facility] Group Notes:  (Nursing/MHT/Case Management/Adjunct)  Date:  04/09/2017  Time:  4:06 AM  Type of Therapy:  Psychoeducational Skills  Participation Level:  Did Not Attend   Wilson SingerJustin  Ada Woodbury 04/09/2017, 4:06 AM

## 2017-04-09 NOTE — Plan of Care (Signed)
Pt. Sleeping progressing as reported by sleep record. Pt. Able to verbalize that things can always get better and is able to demonstrate self control on the unit. Pt. Able to remain safe on the unit. Pt. Not attending groups or unit activities. Pt. Continues to be isolative and withdrawn. Pt. Not able to communicate frustrations.

## 2017-04-09 NOTE — Progress Notes (Signed)
CSW received a call from pt's ACT Team member, Lawanna Kobusngel, who reported that the owner of the group home that pt is moving to is currently sick and will not be able to take pt until tomorrow. Lawanna Kobusngel reported that pt has no other place to go upon discharge, so requested that we discharge him tomorrow due these concerns. CSW will relay this information to the rest of the tx team.   Heidi DachKelsey Ravi Tuccillo, MSW, LCSW 04/09/2017 8:19 AM

## 2017-04-09 NOTE — Progress Notes (Signed)
D:Pt denies SI/HI/AVH. Pt is pleasant and cooperative upon approach, but also presents with a blunted facial expression.  Pt. interacts minimally, but does participate in interaction when asked questions. Pt. Denies any complaints, but appears sad and has a flat affect.    A: Q x 15 minute observation checks were completed for safety. Patient was provided with education. Patient was given scheduled medications. Patient  was encourage to attend groups, participate in unit activities and continue with plan of care.   R:Patient is complaint with medication and unit procedures, but does not attend groups and spent a majority of the shift isolative and withdrawn. Pt. Did have a large snack and was briefly interactive with peers. Pt. When asked If he feels better, worse or the same states he feels, "the same".             Patient slept for Estimated Hours of 9; Precautionary checks every 15 minutes for safety maintained, room free of safety hazards, patient sustains no injury or falls during this shift.

## 2017-04-09 NOTE — [facility] Group Notes (Signed)
LCSW Group Therapy Note  04/09/2017 9:00am  Type of Therapy/Topic:  Group Therapy:  Feelings about Diagnosis  Participation Level:  Minimal   Description of Group:   This group will allow patients to explore their thoughts and feelings about diagnoses they have received. Patients will be guided to explore their level of understanding and acceptance of these diagnoses. Facilitator will encourage patients to process their thoughts and feelings about the reactions of others to their diagnosis and will guide patients in identifying ways to discuss their diagnosis with significant others in their lives. This group will be process-oriented, with patients participating in exploration of their own experiences, giving and receiving support, and processing challenge from other group members.   Therapeutic Goals: 1. Patient will demonstrate understanding of diagnosis as evidenced by identifying two or more symptoms of the disorder 2. Patient will be able to express two feelings regarding the diagnosis 3. Patient will demonstrate their ability to communicate their needs through discussion and/or role play  Summary of Patient Progress: Pt came in group late, but when there participated appropriately.  Does not know his diagnosis.  He seems to have some insight in that doing what his ACTT team and Doctors ask of him is usually helpful and that he feels better.  Shares some concerns about not knowing where his new placement will be and what it will be like there.  CSW supportive and validates those feelings about any change.  Discuss his supports through this new placement process.   Therapeutic Modalities:   Cognitive Behavioral Therapy Brief Therapy Feelings Identification    Glennon MacSara P Aramis Zobel, LCSW 04/09/2017 2:36 PM

## 2017-04-09 NOTE — Plan of Care (Signed)
Patient  remains   not to follow unit  Programing . Refusing to draw  lads  and participate to group therapy  . Refused  Depakote this am but took it for writer   Remains in bed  with blanket pulled over head.

## 2017-04-09 NOTE — [facility] Group Notes (Signed)
[facility] Group Notes:  (Nursing/MHT/Case Management/Adjunct)  Date:  04/09/2017  Time:  9:11 PM  Type of Therapy:  Psychoeducational Skills  Participation Level:  Did Not Attend   Judene CompanionMary  Donalee Gaumond 04/09/2017, 9:11 PM   Judene CompanionMary  Moraima Burd 04/09/2017, 9:14 PM

## 2017-04-09 NOTE — Progress Notes (Signed)
Pt. Compliant with medications during the evening, but refused 0600 medications. Pt. Irritable and refusing medications. Will continue to monitor.

## 2017-04-09 NOTE — Progress Notes (Signed)
Recreation Therapy Notes          Morgin Halls 04/09/2017 4:45 PM 

## 2017-04-10 LAB — COMPREHENSIVE METABOLIC PANEL
ALT: 12 U/L — AB (ref 17–63)
ANION GAP: 10 (ref 5–15)
AST: 13 U/L — ABNORMAL LOW (ref 15–41)
Albumin: 3.4 g/dL — ABNORMAL LOW (ref 3.5–5.0)
Alkaline Phosphatase: 41 U/L (ref 38–126)
BUN: 11 mg/dL (ref 6–20)
CHLORIDE: 102 mmol/L (ref 101–111)
CO2: 26 mmol/L (ref 22–32)
Calcium: 9 mg/dL (ref 8.9–10.3)
Creatinine, Ser: 0.64 mg/dL (ref 0.61–1.24)
GFR calc non Af Amer: >60 mL/min (ref >60)
Glucose, Bld: 97 mg/dL (ref 65–99)
POTASSIUM: 4.4 mmol/L (ref 3.5–5.1)
SODIUM: 138 mmol/L (ref 135–145)
Total Bilirubin: 0.5 mg/dL (ref 0.3–1.2)
Total Protein: 6.1 g/dL — ABNORMAL LOW (ref 6.5–8.1)

## 2017-04-10 LAB — AMMONIA: AMMONIA: 21 umol/L (ref 9–35)

## 2017-04-10 MED ORDER — DIVALPROEX SODIUM 500 MG PO DR TAB: 500.0000 mg | DELAYED_RELEASE_TABLET | Freq: Two times a day (BID) | ORAL | 1 refills | Status: DC

## 2017-04-10 MED ORDER — ARIPIPRAZOLE 10 MG PO TABS
20.0000 mg | ORAL_TABLET | Freq: Every day | ORAL | Status: DC
  Administered 2017-04-10: 20 mg via ORAL
  Filled 2017-04-10: qty 2

## 2017-04-10 MED ORDER — ARIPIPRAZOLE ER 400 MG IM SRER: 400.0000 mg | INTRAMUSCULAR | 1 refills | Status: DC

## 2017-04-10 MED ORDER — ARIPIPRAZOLE 10 MG PO TABS: 10.0000 mg | ORAL_TABLET | Freq: Once | ORAL | Status: DC

## 2017-04-10 MED ORDER — DIVALPROEX SODIUM 500 MG PO DR TAB: 500.0000 mg | DELAYED_RELEASE_TABLET | Freq: Two times a day (BID) | ORAL | Status: DC

## 2017-04-10 MED ORDER — ARIPIPRAZOLE 20 MG PO TABS: 20.0000 mg | ORAL_TABLET | Freq: Every day | ORAL | 1 refills | Status: DC

## 2017-04-10 NOTE — Progress Notes (Signed)
  [facility]  Will you be returning to the same living situation after discharge:  No. Pt is going to a new group home. At discharge, do you have transportation home?: Yes,  ACT Team. Do you have the ability to pay for your medications: Yes,  Disability.  Release of information consent forms completed and in the chart;  Patient's signature needed at discharge.  Patient to Follow up at: Follow-up Information    Services, Psychotherapeutic Follow up on 04/10/2017.   Why:  Your ACT Team will be picking you up and your appointment with them is on 04/10/17 at 11:30AM.  Contact information: [address] [place] [address] [phone]           Next level of care provider [facility] Link:no  Safety Planning and Suicide Prevention discussed: Yes,  with pt's guardian and ACT Team.  Have you used any form of tobacco in the last 30 days? (Cigarettes, Smokeless Tobacco, Cigars, and/or Pipes): No  Has patient been referred to the Quitline?: Patient refused referral  Patient has been referred for addiction treatment: N/A  Heidi DachKelsey Senita Corredor, LCSW 04/10/2017, 9:30 AM

## 2017-04-10 NOTE — Progress Notes (Signed)
Received John Copeland this am, he refused breakfast and lunch. He was awaken to prepare for transport to the group home. The ACT team will pick him up. He was compliant with his medications. He received his discharge order and the  AVS was reviewed and his questions answered. He received his prescriptions and personal items returned. He was discharge with the ACT team without incident.

## 2017-04-10 NOTE — [facility] Suicide Risk Assessment (Signed)
[facility]   Principal Problem: Schizoaffective disorder, bipolar type [facility](HCC) Discharge Diagnoses:  Patient Active Problem List   Diagnosis Date Noted  . Schizophrenia (HCC) [F20.9] 03/29/2017  . Polydipsia [R63.1] 01/12/2017  . Diphenhydramine overdose of undetermined intent [T45.0X4A] 01/10/2017  . Tobacco use disorder [F17.200] 10/07/2016  . Diabetes (HCC) [E11.9] 10/07/2016  . HTN (hypertension) [I10] 10/07/2016  . Dyslipidemia [E78.5] 10/07/2016  . Schizoaffective disorder, bipolar type (HCC) [F25.0] 11/30/2015    Total Time spent with patient: 30 minutes  Musculoskeletal: Strength & Muscle Tone: within normal limits Gait & Station: normal Patient leans: N/A  Psychiatric Specialty Exam: Review of Systems  Neurological: Negative.   Psychiatric/Behavioral: Negative.   All other systems reviewed and are negative.   Blood pressure 134/61, pulse 66, temperature 98.1 F (36.7 C), temperature source Oral, resp. rate 16, height 6' (1.829 m), weight 78.5 kg (173 lb), SpO2 100 %.Body mass index is 23.46 kg/m.  General Appearance: Casual  Eye Contact::  Good  Speech:  Clear and Coherent409  Volume:  Normal  Mood:  Euthymic  Affect:  Blunt  Thought Process:  Goal Directed and Descriptions of Associations: Intact  Orientation:  Full (Time, Place, and Person)  Thought Content:  WDL  Suicidal Thoughts:  No  Homicidal Thoughts:  No  Memory:  Immediate;   Fair Recent;   Fair Remote;   Fair  Judgement:  Impaired  Insight:  Shallow  Psychomotor Activity:  Normal  Concentration:  Fair  Recall:  [employer]  Fund of Knowledge:Fair  Language: Fair  Akathisia:  No  Handed:  Right  AIMS (if indicated):     Assets:  Communication Skills Desire for Improvement Financial Resources/Insurance Housing Physical Health Resilience  Sleep:  Number of Hours: 7.3  Cognition: WNL  ADL's:  Intact   Mental Status Per Nursing Assessment::   On Admission:      Demographic Factors:  Male and Adolescent or young adult  Loss Factors: NA  Historical Factors: Impulsivity  Risk Reduction Factors:   Living with another person, especially a relative and Positive therapeutic relationship  Continued Clinical Symptoms:  Schizophrenia:   Less than 26 years old Paranoid or undifferentiated type  Cognitive Features That Contribute To Risk:  None    Suicide Risk:  Minimal: No identifiable suicidal ideation.  Patients presenting with no risk factors but with morbid ruminations; may be classified as minimal risk based on the severity of the depressive symptoms  Follow-up Information    Services, Psychotherapeutic Follow up on 04/09/2017.   Why:  Your ACT Team will be picking you up and your appointment with them is on 04/09/17 at 11:30AM.  Contact information: [address] [place] [address] [phone]           Plan Of Care/Follow-up recommendations:  Activity:  as tolerated Diet:  low sodium heart helthy ADA diet Other:  keep follow up appointments  Kristine LineaJolanta Ashly Yepez, MD 04/10/2017, 6:18 AM

## 2017-04-10 NOTE — Progress Notes (Signed)
Recreation Therapy Notes  Date: 11.28.18  Time: 1:00pm  Location: Craft Room  Behavioral response: N/A  Intervention Topic: Stress  Discussion/Intervention: Patient did not attend group. Clinical Observations/Feedback:  Patient did not attend group.  Pier Laux LRT/CTRS         Haeleigh Streiff 04/10/2017 2:37 PM 

## 2017-04-10 NOTE — Progress Notes (Signed)
Patient ID: John MuffJoshua Omar Harkleroad, male   DOB: Jul 16, 1990, 26 y.o.   MRN: [id number] Pleasant, quiet, randomly in and out of the day room especially during snack, kept to self, not saying much; limited interaction, refused to clean his room, persuaded and received bedtime medications; denied SI/HI/AVH.
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
## 2017-04-10 NOTE — Progress Notes (Signed)
Recreation Therapy Notes  INPATIENT RECREATION TR PLAN  Patient Details Name: Ugochukwu Chichester MRN: [id number] DOB: 1990-10-21 Today's Date: 04/10/2017  Rec Therapy Plan Is patient appropriate for Therapeutic Recreation?: Yes Treatment times per week: at least 3 Estimated Length of Stay: 5-7 days TR Treatment/Interventions: Group participation (Comment)(Appropriate particiaption in recreation therapy tx)  Discharge Criteria Pt will be discharged from therapy if:: Discharged Treatment plan/goals/alternatives discussed and agreed upon by:: Patient/family  Discharge Summary Short term goals set: The Patient will attend and participate in Recreation Therapy Group Sessions x5 days.  Short term goals met: Not met Progress toward goals comments: (None) Reason goals not met: Patient did not attend any groups Therapeutic equipment acquired: N/A Reason patient discharged from therapy: Discharge from hospital Pt/family agrees with progress & goals achieved: Yes Date patient discharged from therapy: 04/10/17   Fahmida Jurich 04/10/2017, 3:50 PM

## 2017-04-10 NOTE — Plan of Care (Signed)
Patient slept for Estimated Hours of 7; Precautionary checks every 15 minutes for safety maintained, room free of safety hazards, patient sustains no injury or falls during this shift.  

## 2017-04-10 NOTE — BHH Group Notes (Signed)
04/10/2017 2:45pm  Type of Therapy/Topic:  Group Therapy:  Emotion Regulation  Participation Level:  Did Not Attend   Description of Group:   The purpose of this group is to assist patients in learning to regulate negative emotions and experience positive emotions. Patients will be guided to discuss ways in which they have been vulnerable to their negative emotions. These vulnerabilities will be juxtaposed with experiences of positive emotions or situations, and patients will be challenged to use positive emotions to combat negative ones. Special emphasis will be placed on coping with negative emotions in conflict situations, and patients will process healthy conflict resolution skills.  Therapeutic Goals: 1. Patient will identify two positive emotions or experiences to reflect on in order to balance out negative emotions 2. Patient will label two or more emotions that they find the most difficult to experience 3. Patient will demonstrate positive conflict resolution skills through discussion and/or role plays  Summary of Patient Progress:  Did not attend     Therapeutic Modalities:   Cognitive Behavioral Therapy Feelings Identification Dialectical Behavioral Therapy   Johny ShearsCassandra  Sony Schlarb, LCSW 04/10/2017 12:30 PM

## 2017-04-28 ENCOUNTER — Encounter (HOSPITAL_COMMUNITY): Payer: Self-pay | Admitting: Emergency Medicine

## 2017-04-28 ENCOUNTER — Emergency Department (HOSPITAL_COMMUNITY)
Admission: EM | Admit: 2017-04-28 | Discharge: 2017-04-29 | Disposition: A | Discharge status: Other Acute Inpatient Hospital | Payer: Medicaid Other | Attending: Emergency Medicine | Admitting: Emergency Medicine

## 2017-04-28 DIAGNOSIS — Z046 Encounter for general psychiatric examination, requested by authority: Secondary | ICD-10-CM | POA: Insufficient documentation

## 2017-04-28 DIAGNOSIS — E119 Type 2 diabetes mellitus without complications: Secondary | ICD-10-CM | POA: Diagnosis not present

## 2017-04-28 DIAGNOSIS — F250 Schizoaffective disorder, bipolar type: Secondary | ICD-10-CM | POA: Diagnosis present

## 2017-04-28 DIAGNOSIS — F209 Schizophrenia, unspecified: Secondary | ICD-10-CM | POA: Insufficient documentation

## 2017-04-28 DIAGNOSIS — F17210 Nicotine dependence, cigarettes, uncomplicated: Secondary | ICD-10-CM | POA: Insufficient documentation

## 2017-04-28 DIAGNOSIS — Z7984 Long term (current) use of oral hypoglycemic drugs: Secondary | ICD-10-CM | POA: Insufficient documentation

## 2017-04-28 DIAGNOSIS — Z79899 Other long term (current) drug therapy: Secondary | ICD-10-CM | POA: Diagnosis not present

## 2017-04-28 DIAGNOSIS — F419 Anxiety disorder, unspecified: Secondary | ICD-10-CM | POA: Diagnosis present

## 2017-04-28 DIAGNOSIS — I10 Essential (primary) hypertension: Secondary | ICD-10-CM | POA: Insufficient documentation

## 2017-04-28 LAB — CBC WITH DIFFERENTIAL/PLATELET
Basophils Absolute: 0 10*3/uL (ref 0.0–0.1)
Basophils Relative: 1 %
EOS ABS: 0 10*3/uL (ref 0.0–0.7)
EOS PCT: 0 %
HCT: 38.2 % — ABNORMAL LOW (ref 39.0–52.0)
Hemoglobin: 12.5 g/dL — ABNORMAL LOW (ref 13.0–17.0)
LYMPHS ABS: 2.1 10*3/uL (ref 0.7–4.0)
Lymphocytes Relative: 25 %
MCH: 23.8 pg — AB (ref 26.0–34.0)
MCHC: 32.7 g/dL (ref 30.0–36.0)
MCV: 72.8 fL — ABNORMAL LOW (ref 78.0–100.0)
MONOS PCT: 5 %
Monocytes Absolute: 0.4 10*3/uL (ref 0.1–1.0)
Neutro Abs: 6 10*3/uL (ref 1.7–7.7)
Neutrophils Relative %: 69 %
PLATELETS: 282 10*3/uL (ref 150–400)
RBC: 5.25 MIL/uL (ref 4.22–5.81)
RDW: 15.9 % — AB (ref 11.5–15.5)
WBC: 8.6 10*3/uL (ref 4.0–10.5)

## 2017-04-28 LAB — RAPID URINE DRUG SCREEN, HOSP PERFORMED
Amphetamines: NOT DETECTED
Barbiturates: NOT DETECTED
Benzodiazepines: NOT DETECTED
COCAINE: NOT DETECTED
OPIATES: NOT DETECTED
TETRAHYDROCANNABINOL: NOT DETECTED

## 2017-04-28 LAB — COMPREHENSIVE METABOLIC PANEL
ALT: 25 U/L (ref 17–63)
ANION GAP: 12 (ref 5–15)
AST: 28 U/L (ref 15–41)
Albumin: 4.5 g/dL (ref 3.5–5.0)
Alkaline Phosphatase: 44 U/L (ref 38–126)
BUN: 12 mg/dL (ref 6–20)
CALCIUM: 9.6 mg/dL (ref 8.9–10.3)
CHLORIDE: 101 mmol/L (ref 101–111)
CO2: 23 mmol/L (ref 22–32)
Creatinine, Ser: 0.86 mg/dL (ref 0.61–1.24)
GFR calc non Af Amer: >60 mL/min (ref >60)
Glucose, Bld: 90 mg/dL (ref 65–99)
Potassium: 3.9 mmol/L (ref 3.5–5.1)
SODIUM: 136 mmol/L (ref 135–145)
Total Bilirubin: 0.9 mg/dL (ref 0.3–1.2)
Total Protein: 7.7 g/dL (ref 6.5–8.1)

## 2017-04-28 LAB — VALPROIC ACID LEVEL: Valproic Acid Lvl: <10 ug/mL — ABNORMAL LOW (ref 50.0–100.0)

## 2017-04-28 LAB — ETHANOL: Alcohol, Ethyl (B): <10 mg/dL (ref <10)

## 2017-04-28 MED ORDER — LORAZEPAM 1 MG PO TABS
1.0000 mg | ORAL_TABLET | Freq: Three times a day (TID) | ORAL | Status: DC
  Administered 2017-04-28 – 2017-04-29 (×2): 1 mg via ORAL
  Filled 2017-04-28 (×3): qty 1

## 2017-04-28 MED ORDER — ATORVASTATIN CALCIUM 10 MG PO TABS
10.0000 mg | ORAL_TABLET | Freq: Every day | ORAL | Status: DC
  Filled 2017-04-28: qty 1

## 2017-04-28 MED ORDER — TRAZODONE HCL 100 MG PO TABS
100.0000 mg | ORAL_TABLET | Freq: Every day | ORAL | Status: DC
  Filled 2017-04-28: qty 1

## 2017-04-28 MED ORDER — ADULT MULTIVITAMIN W/MINERALS CH
1.0000 | ORAL_TABLET | Freq: Every day | ORAL | Status: DC
  Administered 2017-04-28 – 2017-04-29 (×2): 1 via ORAL
  Filled 2017-04-28 (×2): qty 1

## 2017-04-28 MED ORDER — DIPHENHYDRAMINE HCL 25 MG PO CAPS
50.0000 mg | ORAL_CAPSULE | Freq: Once | ORAL | Status: AC
  Administered 2017-04-28: 50 mg via ORAL
  Filled 2017-04-28: qty 2

## 2017-04-28 MED ORDER — ASENAPINE MALEATE 5 MG SL SUBL
10.0000 mg | SUBLINGUAL_TABLET | Freq: Two times a day (BID) | SUBLINGUAL | Status: DC
  Administered 2017-04-28: 10 mg via SUBLINGUAL
  Filled 2017-04-28 (×3): qty 2

## 2017-04-28 MED ORDER — BENZTROPINE MESYLATE 0.5 MG PO TABS
0.5000 mg | ORAL_TABLET | Freq: Two times a day (BID) | ORAL | Status: DC
  Administered 2017-04-29: 0.5 mg via ORAL
  Filled 2017-04-28 (×2): qty 1

## 2017-04-28 MED ORDER — DIVALPROEX SODIUM 500 MG PO DR TAB
500.0000 mg | DELAYED_RELEASE_TABLET | Freq: Once | ORAL | Status: AC
  Administered 2017-04-28: 500 mg via ORAL
  Filled 2017-04-28: qty 1

## 2017-04-28 MED ORDER — LISINOPRIL 10 MG PO TABS
10.0000 mg | ORAL_TABLET | Freq: Every day | ORAL | Status: DC
  Administered 2017-04-29: 10 mg via ORAL
  Filled 2017-04-28 (×3): qty 1

## 2017-04-28 MED ORDER — ARIPIPRAZOLE 10 MG PO TABS
20.0000 mg | ORAL_TABLET | Freq: Every day | ORAL | Status: DC
  Administered 2017-04-28: 20 mg via ORAL
  Filled 2017-04-28: qty 2

## 2017-04-28 MED ORDER — DIVALPROEX SODIUM 500 MG PO DR TAB: 500.0000 mg | DELAYED_RELEASE_TABLET | Freq: Two times a day (BID) | ORAL | Status: DC

## 2017-04-28 MED ORDER — LORAZEPAM 1 MG PO TABS
2.0000 mg | ORAL_TABLET | Freq: Once | ORAL | Status: AC
  Administered 2017-04-28: 2 mg via ORAL
  Filled 2017-04-28: qty 2

## 2017-04-28 MED ORDER — FLUVOXAMINE MALEATE 50 MG PO TABS: 100.0000 mg | ORAL_TABLET | Freq: Every day | ORAL | Status: DC

## 2017-04-28 MED ORDER — HALOPERIDOL 5 MG PO TABS
10.0000 mg | ORAL_TABLET | Freq: Two times a day (BID) | ORAL | Status: DC
  Administered 2017-04-28: 10 mg via ORAL
  Filled 2017-04-28: qty 2

## 2017-04-28 MED ORDER — METFORMIN HCL 500 MG PO TABS
1000.0000 mg | ORAL_TABLET | Freq: Two times a day (BID) | ORAL | Status: DC
  Administered 2017-04-28 – 2017-04-29 (×2): 1000 mg via ORAL
  Filled 2017-04-28 (×2): qty 2

## 2017-04-28 MED ORDER — BENZTROPINE MESYLATE 1 MG PO TABS
1.0000 mg | ORAL_TABLET | Freq: Every day | ORAL | Status: DC
  Administered 2017-04-28: 1 mg via ORAL
  Filled 2017-04-28: qty 1

## 2017-04-28 MED ORDER — OXCARBAZEPINE 300 MG PO TABS
600.0000 mg | ORAL_TABLET | Freq: Two times a day (BID) | ORAL | Status: DC
  Administered 2017-04-28 – 2017-04-29 (×2): 600 mg via ORAL
  Filled 2017-04-28 (×3): qty 2

## 2017-04-28 MED ORDER — DIPHENHYDRAMINE HCL 25 MG PO CAPS: 50.0000 mg | ORAL_CAPSULE | Freq: Every day | ORAL | Status: DC

## 2017-04-28 NOTE — BHH Counselor (Addendum)
[facility] been [facility], pt can come after 11am on 04/29/2017. Attending physician: Dr. Elby Showersaj. Nursing report: [phone]. Updated disposition discussed with Jillyn HiddenGary, RN.   Redmond Pullingreylese D Shajuana Mclucas, MS, [facility], [facility] Triage Specialist [phone]

## 2017-04-28 NOTE — ED Notes (Signed)
Hourly rounding reveals patient sleeping in room. No complaints, stable, in no acute distress. Q15 minute rounds and monitoring via Security Cameras to continue. 
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
## 2017-04-28 NOTE — ED Triage Notes (Signed)
Pt comes to ed via ems, pt was walking around down town, walked into the [employer] lobby to charge his phone, no a hotel guest. [employer] called [employer], and [employer] called ems. Pt verbalizes he is" not in his right mind." no pain, just overly excited and anxious,states he has been drinking lots of coffee. V/s on arrival 146/100, pulse 92. Rr18. Hx of taking Depakote from his doctor. last med Use/ last med taken last night. Not wanting to go home to his sisters.
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
## 2017-04-28 NOTE — BHH Counselor (Signed)
Dr Jannifer FranklinAkintayo completed IVC paperwork. Magistrate Bing PlumeHaynes confirmed receipt of faxed paperwork. Originals placed in IVC notebook in Psych ED and copies placed in pt's chart.  Evette Cristalaroline Paige Embry Huss, [place] Therapeutic Triage Specialist

## 2017-04-28 NOTE — BHH Counselor (Signed)
Pt has been faxed to the following inpatient facilities:   [facility]  [facility]. [facility]. [facility].  [facility].    TTS will continue to follow up.   Redmond Pullingreylese D Cowen Pesqueira, MS, [facility], [facility] Triage Specialist [phone]

## 2017-04-28 NOTE — ED Notes (Addendum)
Contact numbers for family.  Group 1 AutomotiveUncle Hussein [phone].  Sister [phone].  Grandmother  [phone].

## 2017-04-28 NOTE — ED Notes (Signed)
Pt on the floor, chanting and praying Staying he is sorry, he is sorry, he trying to survive sir" Please for give, please for give me.\ Pt is writing letters to god."

## 2017-04-28 NOTE — ED Notes (Signed)
Pt oriented to room and unit.  Pt is pleasant and cooperative.  Pt is hyper-religeous and fixated on showering.  He went in and showered and became upset because he didn't have enough body wash to finish.  He became tearful because he was worried he had upset us.  He seems extremely anxious.  He denies S/I, H/I, and AVH.  15 minute checks and video monitoring in place.

## 2017-04-28 NOTE — ED Provider Notes (Signed)
Care assumed from previous provider PA Pisciotta. Please see note for further details. Case discussed, plan agreed upon. Briefly, patient is a 26 y.o. male with hx of schizophrenia brought in by EMS [facility]. He does have a legal guardian. Will follow up on medical clearance labs and re-evaluate.   Blood work reviewed. Valproic acid level low, but otherwise reassuring. Depakote given in ED along with remainder of home meds ordered. Patient re-evaluated. He is hyperverbal, still on the floor writing letters to God asking me to sit on the floor with him and pray for everyone. Given this and low depakote level, will obtain psychiatric evaluation. Medically cleared with disposition pending TTS evaluation.    Elim Economou, Chase PicketJaime Pilcher, PA-C 04/28/17 [id number]    Vanetta MuldersZackowski, Scott, MD 04/28/17 [phone]

## 2017-04-28 NOTE — BH Assessment (Addendum)
Assessment Note  John Copeland is an 26 y.o. male. Pt presents volutarily to [facility]. Per chart, John Copeland called [employer] b/c pt was trying to charge his cell phone in lobby. Pt is pleasant and oriented to self, date and location. He is hyperreligous. Pt is euthymic but appears tearful at times. He is restless. He often clasps his  against forehead and prays during assessment.  He exhibits flight of ideas. He is redirectable, but he always brings conversation back to Jesus and his relationship with God. Pt says he has to "put people first" and pray from them the way Jesus does. Pt says his brother in law is from [place] and brother in law and sister are  Muslim. This appears to bother pt. [facility] times in 2018 w/ most recent visit mid Nov. He has [facility] John Copeland(John Copeland [phone]). Pt's [name]. Pt says he used to live in family care home but they never let him go outside. Pt endorses tearfulness and isolating behavior. Pt denies SI currently or at any time in the past. Pt says he wouldn't commit suicide b/c he would go to hell. Pt denies any history of suicide attempts and denies history of self-mutilation. Pt denies homicidal thoughts or physical aggression. Pt denies having access to firearms. Pt denies having any legal problems at this time. Pt denies any current or past substance abuse problems. Pt does not appear to be intoxicated or in withdrawal at this time. Pt denies AHVH.  Pt refuses to tell writer the name and phone number of pt's sister. He says he called her at 6 am (during Muslim prayer time) but she didn't answer.  Diagnosis: Schizoaffective Disorder, Bipolar Type  Past Medical History:  Past Medical History:  Diagnosis Date  . Anxiety   . Depression   . Diabetes mellitus without complication (HCC)   . Schizophrenia (HCC)     History reviewed. No pertinent surgical history.  Family History: No family history on  file.  Social History:  reports that he has been smoking cigarettes.  He has been smoking about 0.50 packs per day. he has never used smokeless tobacco. He reports that he does not drink alcohol or use drugs.  Additional Social History:  Alcohol / Drug Use Pain Medications: pt denies abuse - see ptamed list Prescriptions: pt denies abuse - see pta meds list Over the Counter: pt denies abuse - see pta meds list History of alcohol / drug use?: No history of alcohol / drug abuse Longest period of sobriety (when/how long): n/a  CIWA: CIWA-Ar BP: (!) 158/90 Pulse Rate: (!) 110 COWS:    Allergies: No Known Allergies  Home Medications:  (Not in a hospital admission)  OB/GYN Status:  No LMP for male patient.  General Assessment Data [facility] Assessment: In system Is this a Tele or Face-to-Face Assessment?: Face-to-Face Is this an Initial Assessment or a Re-assessment for this encounter?: Initial Assessment Marital status: Single Maiden name: n/a Is patient pregnant?: No Pregnancy Status: No Living Arrangements: Other relatives(sister and brother in law) Can pt return to current living arrangement?: Yes Admission Status: Voluntary Is patient capable of signing voluntary admission?: Yes Referral Source: Other(John Copeland called [employer] who called EMS) Insurance type: [employer]     Crisis Care Plan Living Arrangements: Other relatives(sister and brother in law) Legal Guardian: ([employer] of John Copeland) Name of Psychiatrist: [facility] Name of Therapist: [facility]  Education Status Is patient currently in school?: No Highest grade of school patient has completed: 9  Risk to self with the past 6 months Suicidal Ideation: No Has patient been a risk to self within the past 6 months prior to admission? : No Suicidal Intent: No Has patient had any suicidal intent within the past 6 months prior to admission? : No Is patient at risk for suicide?: No Suicidal  Plan?: No Has patient had any suicidal plan within the past 6 months prior to admission? : No Access to Means: No What has been your use of drugs/alcohol within the last 12 months?: n/a Previous Attempts/Gestures: No How many times?: 0 Other Self Harm Risks: pt denies Triggers for Past Attempts: (n/a) Intentional Self Injurious Behavior: None Family Suicide History: No Recent stressful life event(s): Other (Comment)(brother in law is Muslim and sometimes not nice to pt) Persecutory voices/beliefs?: No Depression: Yes Depression Symptoms: Tearfulness, Isolating Substance abuse history and/or treatment for substance abuse?: No Suicide prevention information given to non-admitted patients: Not applicable  Risk to Others within the past 6 months Homicidal Ideation: No Does patient have any lifetime risk of violence toward others beyond the six months prior to admission? : No Thoughts of Harm to Others: No Current Homicidal Intent: No Current Homicidal Plan: No Access to Homicidal Means: No Identified Victim: none History of harm to others?: No Assessment of Violence: None Noted Violent Behavior Description: pt denies hx violence Does patient have access to weapons?: No Criminal Charges Pending?: No Does patient have a court date: No Is patient on probation?: No  Psychosis Hallucinations: None noted Delusions: (hyper religiosity)  Mental Status Report Appearance/Hygiene: Unremarkable, In scrubs Eye Contact: Fair Motor Activity: Freedom of movement, Hyperactivity Speech: Rapid, Unremarkable Level of Consciousness: Alert Mood: Euthymic Affect: Labile(euthymic and labile at times) Anxiety Level: Minimal Thought Processes: Relevant, Coherent, Flight of Ideas Judgement: Impaired Orientation: Person, Place, Time Obsessive Compulsive Thoughts/Behaviors: None  Cognitive Functioning Concentration: Decreased Memory: Remote Intact, Recent Intact IQ: Average Insight:  Fair Impulse Control: Fair Appetite: Good Sleep: Decreased Vegetative Symptoms: None  ADLScreening [facility] Assessment Services) Patient's cognitive ability adequate to safely complete daily activities?: Yes Patient able to express need for assistance with ADLs?: Yes Independently performs ADLs?: Yes (appropriate for developmental age)  Prior Inpatient Therapy Prior Inpatient Therapy: Yes Prior Therapy Dates: 2018 - multiple admissions Prior [facility]ilty[facility] Reason for Treatment: psychosis  Prior Outpatient Therapy Prior Outpatient Therapy: Yes Prior Therapy Dates: currently Prior Therapy Facilty/Provider(s): [facility] Reason for Treatment: schizoaffective d/o, med management Does patient have an ACCT team?: Yes Does patient have Intensive In-House Services?  : No Does patient have [facility] services? : No Does patient have P4CC services?: No  ADL Screening (condition at time of admission) Patient's cognitive ability adequate to safely complete daily activities?: Yes Is the patient deaf or have difficulty hearing?: No Does the patient have difficulty seeing, even when wearing glasses/contacts?: No Does the patient have difficulty concentrating, remembering, or making decisions?: Yes Patient able to express need for assistance with ADLs?: Yes Does the patient have difficulty dressing or bathing?: No Independently performs ADLs?: Yes (appropriate for developmental age) Does the patient have difficulty walking or climbing stairs?: No Weakness of Legs: None Weakness of Arms/Hands: None  Home Assistive Devices/Equipment Home Assistive Devices/Equipment: None    Abuse/Neglect Assessment (Assessment to be complete while patient is alone) Abuse/Neglect Assessment Can Be Completed: Yes Physical Abuse: Denies Verbal Abuse: Denies Sexual Abuse: Denies Exploitation of patient/patient's  resources: Denies Self-Neglect: Denies     Merchant navy officer (For  Healthcare) Does Patient Have a Medical Advance Directive?: No Would patient like information on creating a medical advance directive?: No - Patient declined    Additional Information 1:1 In Past 12 Months?: No CIRT Risk: No Elopement Risk: No Does patient have medical clearance?: Yes     Disposition:  Disposition Initial Assessment Completed for this Encounter: Yes Disposition of Patient: Inpatient treatment program(John lord DNP recommend inpatient - [address] hal) Type of inpatient treatment program: Adult  On Site Evaluation by:   Reviewed with Physician:    John Copeland John Copeland 04/28/2017 10:41 AM

## 2017-04-28 NOTE — ED Notes (Signed)
Report to include situation, background, assessment and recommendations from Edie RN. Patient sleeping, respirations regular and unlabored. Q15 minute rounds and security camera observation to continue.   

## 2017-04-29 ENCOUNTER — Other Ambulatory Visit: Payer: Self-pay | Admitting: Family

## 2017-04-29 NOTE — ED Notes (Signed)
Hourly rounding reveals patient sleeping in room. No complaints, stable, in no acute distress. Q15 minute rounds and monitoring via Security Cameras to continue. 

## 2017-04-29 NOTE — ED Notes (Signed)
Pt was very irritable at discharge.  Pt went somewhat reluctantly with [employer].  He did not fight physically.  Pt took his med prior to leaving except for refusing sapphris which burned his tongue yesterday.  All belongings were sent with patient.

## 2017-05-16 ENCOUNTER — Encounter: Payer: Self-pay | Admitting: Emergency Medicine

## 2017-05-16 ENCOUNTER — Emergency Department
Admission: EM | Admit: 2017-05-16 | Discharge: 2017-05-16 | Disposition: A | Discharge status: Home / Self Care | Payer: Medicaid Other | Attending: Emergency Medicine | Admitting: Emergency Medicine

## 2017-05-16 ENCOUNTER — Emergency Department (EMERGENCY_DEPARTMENT_HOSPITAL)
Admission: EM | Admit: 2017-05-16 | Discharge: 2017-05-17 | Disposition: A | Discharge status: Home / Self Care | Payer: Medicaid Other | Source: Home / Self Care | Attending: Emergency Medicine | Admitting: Emergency Medicine

## 2017-05-16 ENCOUNTER — Other Ambulatory Visit: Payer: Self-pay

## 2017-05-16 DIAGNOSIS — E119 Type 2 diabetes mellitus without complications: Secondary | ICD-10-CM

## 2017-05-16 DIAGNOSIS — R112 Nausea with vomiting, unspecified: Secondary | ICD-10-CM | POA: Diagnosis not present

## 2017-05-16 DIAGNOSIS — F250 Schizoaffective disorder, bipolar type: Secondary | ICD-10-CM | POA: Diagnosis present

## 2017-05-16 DIAGNOSIS — Z5321 Procedure and treatment not carried out due to patient leaving prior to being seen by health care provider: Secondary | ICD-10-CM | POA: Diagnosis not present

## 2017-05-16 DIAGNOSIS — F209 Schizophrenia, unspecified: Secondary | ICD-10-CM

## 2017-05-16 DIAGNOSIS — I10 Essential (primary) hypertension: Secondary | ICD-10-CM | POA: Diagnosis present

## 2017-05-16 LAB — COMPREHENSIVE METABOLIC PANEL
ALBUMIN: 4.3 g/dL (ref 3.5–5.0)
ALK PHOS: 42 U/L (ref 38–126)
ALT: 18 U/L (ref 17–63)
AST: 19 U/L (ref 15–41)
Anion gap: 9 (ref 5–15)
BILIRUBIN TOTAL: 0.9 mg/dL (ref 0.3–1.2)
BUN: 17 mg/dL (ref 6–20)
CALCIUM: 9.7 mg/dL (ref 8.9–10.3)
CO2: 25 mmol/L (ref 22–32)
CREATININE: 0.7 mg/dL (ref 0.61–1.24)
Chloride: 104 mmol/L (ref 101–111)
GFR calc Af Amer: >60 mL/min (ref >60)
GFR calc non Af Amer: >60 mL/min (ref >60)
GLUCOSE: 108 mg/dL — AB (ref 65–99)
Potassium: 4 mmol/L (ref 3.5–5.1)
SODIUM: 138 mmol/L (ref 135–145)
TOTAL PROTEIN: 7.5 g/dL (ref 6.5–8.1)

## 2017-05-16 LAB — URINALYSIS, COMPLETE (UACMP) WITH MICROSCOPIC
Bacteria, UA: NONE SEEN
Bilirubin Urine: NEGATIVE
GLUCOSE, UA: NEGATIVE mg/dL
Hgb urine dipstick: NEGATIVE
Ketones, ur: NEGATIVE mg/dL
Leukocytes, UA: NEGATIVE
Nitrite: NEGATIVE
Protein, ur: NEGATIVE mg/dL
SPECIFIC GRAVITY, URINE: 1.028 (ref 1.005–1.030)
Squamous Epithelial / LPF: NONE SEEN
pH: 5 (ref 5.0–8.0)

## 2017-05-16 LAB — CBC
HCT: 40.3 % (ref 40.0–52.0)
Hemoglobin: 13 g/dL (ref 13.0–18.0)
MCH: 24 pg — AB (ref 26.0–34.0)
MCHC: 32.2 g/dL (ref 32.0–36.0)
MCV: 74.4 fL — ABNORMAL LOW (ref 80.0–100.0)
Platelets: 327 10*3/uL (ref 150–440)
RBC: 5.41 MIL/uL (ref 4.40–5.90)
RDW: 14.9 % — AB (ref 11.5–14.5)
WBC: 6.2 10*3/uL (ref 3.8–10.6)

## 2017-05-16 LAB — LIPASE, BLOOD: Lipase: 33 U/L (ref 11–51)

## 2017-05-16 NOTE — ED Notes (Addendum)
Legal guardian, [name], in attempts to get permission to treat and see the patient.

## 2017-05-16 NOTE — ED Notes (Signed)
[name] called facility back and gave verbal permission to [facility] without being seen and treated by a physician.

## 2017-05-16 NOTE — ED Notes (Signed)
Patient's group home has arrived to take the patient home.  The representative, Hessie Dienerlan, has also attempted to get a hold of the legal guardian to inform them that the patient should be taken home.  Informed the Group home representative that I must have permission from the legal guardian first.  Hessie Dienerlan, verbalized that he understand this information.

## 2017-05-16 NOTE — ED Notes (Signed)
Called pt with no response 

## 2017-05-16 NOTE — ED Triage Notes (Signed)
Ems said vss and fsbs was 136

## 2017-05-16 NOTE — ED Provider Notes (Signed)
[facility] Emergency Department Provider Note  ____________________________________________   First MD Initiated Contact with Patient 05/16/17 2306     (approximate)  I have reviewed the triage vital signs and the nursing notes.   HISTORY  Chief Complaint Psychiatric Evaluation   HPI John Copeland is a 27 y.o. male comes to the emergency department on an involuntary basis.  According to the involuntary commitment the patient has a history of well-controlled schizophrenia and today became agitated at his group home.  Apparently the patient was seen in our emergency department earlier today for abdominal pain however he left before being seen by a physician.  The patient does admit that he initially came for abdominal pain not for any true pain but because he wanted to get out of his group home.  He says he does not like his group home, however he would be happy to go back there tonight.  He himself has no complaints.  He denies suicidal ideation or homicidal ideation.  His symptoms have been gradual onset and constant.  They seem to be worsened by interpersonal conflict and improved with rest.  Past Medical History:  Diagnosis Date  . Anxiety   . Depression   . Diabetes mellitus without complication (HCC)   . Schizophrenia [facility])     Patient Active Problem List   Diagnosis Date Noted  . Schizophrenia (HCC) 03/29/2017  . 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 Diphenhydramine overdose of undetermined intent 01/10/2017  . Tobacco use disorder 10/07/2016  . Diabetes (HCC) 10/07/2016  . HTN (hypertension) 10/07/2016  . Dyslipidemia 10/07/2016  . Schizoaffective disorder, bipolar type (HCC) 11/30/2015    History reviewed. No pertinent surgical history.  Prior to Admission medications   Not on File    Allergies Patient has no known allergies.  No family history on file.  Social History Social History   Tobacco Use  . Smoking status: Current Every Day  Smoker    Packs/day: 0.50    Types: Cigarettes  . Smokeless tobacco: Never Used  Substance Use Topics  . Alcohol use: No  . Drug use: No    Review of Systems Constitutional: No fever/chills Eyes: No visual changes. ENT: No sore throat. Cardiovascular: Denies chest pain. Respiratory: Denies shortness of breath. Gastrointestinal: No abdominal pain.  No nausea, no vomiting.  No diarrhea.  No constipation. Genitourinary: Negative for dysuria. Musculoskeletal: Negative for back pain. Skin: Negative for rash. Neurological: Negative for headaches, focal weakness or numbness.   ____________________________________________   PHYSICAL EXAM:  VITAL SIGNS: ED Triage Vitals  Enc Vitals Group     BP 05/16/17 2226 135/69     Pulse Rate 05/16/17 2226 93     Resp 05/16/17 2226 18     Temp 05/16/17 2226 98.4 F (36.9 C)     Temp Source 05/16/17 2226 Oral     SpO2 05/16/17 2226 97 %     Weight --      Height 05/16/17 2231 6' (1.829 m)     Head Circumference --      Peak Flow --      Pain Score --      Pain Loc --      Pain Edu? --      Excl. in GC? --     Constitutional: Alert and oriented x4 pleasant cooperative clearly with schizophrenia but very well controlled Eyes: PERRL EOMI. Head: Atraumatic. Nose: No congestion/rhinnorhea. Mouth/Throat: No trismus Neck: No stridor.   Cardiovascular: Normal rate, regular rhythm.  Grossly normal heart sounds.  Good peripheral circulation. Respiratory: Normal respiratory effort.  No retractions. Lungs CTAB and moving good air Gastrointestinal: Soft nontender Musculoskeletal: No lower extremity edema   Neurologic:  Normal speech and language. No gross focal neurologic deficits are appreciated. Skin:  Skin is warm, dry and intact. No rash noted. Psychiatric: Appropriate cooperative   ____________________________________________   DIFFERENTIAL includes but not limited to  Suicidal ideation, drug overdose, alcohol  intoxication ____________________________________________   LABS (all labs ordered are listed, but only abnormal results are displayed)  Labs Reviewed - No data to display  Lab work from earlier today reviewed by me with no acute disease __________________________________________  EKG   ____________________________________________  RADIOLOGY   ____________________________________________   PROCEDURES  Procedure(s) performed: no  Procedures  Critical Care performed: no  Observation: no ____________________________________________   INITIAL IMPRESSION / ASSESSMENT AND PLAN / ED COURSE  Pertinent labs & imaging results that were available during my care of the patient were reviewed by me and considered in my medical decision making (see chart for details).  Patient arrives calm cooperative well-appearing.  His exam is unremarkable.  He clearly does have schizophrenia but is very well controlled.  I am actually familiar with the patient and he appears more calm and reasonable than he has in the past.  I do not believe he requires involuntary commitment at this time and I am releasing him from his IVC paperwork.  TTS agrees with this.  The patient does consent to stay to be evaluated by psychiatry tonight.  I do anticipate discharge back to his group home.      ____________________________________________   FINAL CLINICAL IMPRESSION(S) / ED DIAGNOSES  Final diagnoses:  Schizophrenia, unspecified type (HCC)      NEW MEDICATIONS STARTED DURING THIS VISIT:  This SmartLink is deprecated. Use AVSMEDLIST instead to display the medication list for a patient.   Note:  This document was prepared using Dragon voice recognition software and may include unintentional dictation errors.     Merrily Brittleifenbark, Eyad Rochford, MD 05/17/17 [phone]

## 2017-05-16 NOTE — ED Notes (Signed)
Patient is not to have protocols repeated per Dr. Scotty CourtStafford since was seen here earlier today and had work up done.

## 2017-05-16 NOTE — ED Triage Notes (Signed)
Pt comes into the ED via [employer] where his group home had him IVC for competitiveness. Patient was here earlier today and wanted to be evaluated.  The group home picked him up earlier today and took him home without him being evaluated.  Group home was [facility] and the patient was upset and began getting combative.  Patient is calm and cooperative at this time.  Patient, according to [employer], us [place].  Patient in NAD at this time with even and unlabored respirations.

## 2017-05-16 NOTE — ED Triage Notes (Addendum)
Says stomach queezy and vomiting since this am.  No diarrhea or abdominal pain.  Patient was brought by ems as he had no ride.  [facility]  He is ont he phone now with someone saying he does not know the address for the grouphome and does not know how he will get home.

## 2017-05-17 DIAGNOSIS — F250 Schizoaffective disorder, bipolar type: Secondary | ICD-10-CM | POA: Diagnosis not present

## 2017-05-17 NOTE — ED Notes (Addendum)
John Copeland [phone]) called to update that patient is ready for discharge. No response.

## 2017-05-17 NOTE — ED Notes (Signed)
SOC machine set up in patients room. 

## 2017-05-17 NOTE — ED Notes (Signed)
Report given to oncoming shift.
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
## 2017-05-17 NOTE — ED Notes (Signed)
Telepsych talking to pt

## 2017-05-17 NOTE — ED Notes (Signed)
Hourly rounding reveals patient sleeping in room. No complaints, stable, in no acute distress. Q15 minute rounds and monitoring via Security Cameras to continue. 

## 2017-05-17 NOTE — ED Notes (Signed)
Attempted to call legal guardian x 3 with no response.

## 2017-05-17 NOTE — ED Notes (Signed)
Attempted to contact legal guardian. No answer, voicemail left for guardian to return a call to ED.

## 2017-05-17 NOTE — ED Notes (Signed)
Ivc/ physician/ nurse / and security aware of ivc/ soc complete/ moved to bhu/ ivc rescinded/ pending discharge
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
## 2017-05-17 NOTE — ED Notes (Signed)
SOC called back to report pt. Was calm and cooperative and recommends pt. To be discharged back to group home.

## 2017-05-17 NOTE — ED Provider Notes (Signed)
-----------------------------------------   2:14 PM on 05/17/2017 -----------------------------------------   Blood pressure 138/78, pulse 78, temperature 97.6 F (36.4 C), temperature source Oral, resp. rate 18, height 6' (1.829 m), SpO2 100 %.  The patient had no acute events since last update.  Calm and cooperative at this time.    Patient evaluated by Dr. Toni Amendlapacs and deems the patient appropriate for discharge back to group home at this time.  Patient without any suicidal homicidal ideation.      Myrna BlazerSchaevitz, Sanchez Hemmer Matthew, MD 05/17/17 1414

## 2017-05-17 NOTE — BH Assessment (Signed)
Assessment Note  John MuffJoshua Omar Copeland is an 27 y.o. male presenting to the ED, under IVC, for combative behavior.  Pt presented to the ED early in the day for abdominal pain but left before being examined by the physician.  Pt admits to faking his abdominal pain in order to get away from his group home.  Pt reports he does not like his current group home and is supposed to be going to a new one in [place] on 05/17/17.  Pt reports he would be ok going back to his current group home when discharged from the hospital.  Pt denies SI/HI and any auditory/visual hallucinations.  He denies drug/alcohol use.  Diagnosis: anxiety  Past Medical History:  Past Medical History:  Diagnosis Date  . Anxiety   . Depression   . Diabetes mellitus without complication ([facility])   . Schizophrenia ([facility])     History reviewed. No pertinent surgical history.  Family History: No family history on file.  Social History:  reports that he has been smoking cigarettes.  He has been smoking about 0.50 packs per day. he has never used smokeless tobacco. He reports that he does not drink alcohol or use drugs.  Additional Social History:     CIWA: CIWA-Ar BP: 140/85 Pulse Rate: 87 COWS:    Allergies: No Known Allergies  Home Medications:  (Not in a hospital admission)  OB/GYN Status:  No LMP for male patient.  General Assessment Data [facility] Assessment: In system Is this a Tele or Face-to-Face Assessment?: Face-to-Face Is this an Initial Assessment or a Re-assessment for this encounter?: Initial Assessment Marital status: Single Maiden name: n/a Is patient pregnant?: No Pregnancy Status: No Living Arrangements: Group Home Can pt return to current living arrangement?: Yes Admission Status: Involuntary Is patient capable of signing voluntary admission?: Yes Referral Source: Other(group home) Insurance type: [employer]     Crisis Care Plan Living Arrangements: Group  Home Legal Guardian: Other:([name] Gash, [facility] [phone]) Name of Psychiatrist: [facility] Name of Therapist: [facility]  Education Status Is patient currently in school?: No Current Grade: n/a Highest grade of school patient has completed: 9 Name of school: n/a Contact person: n/a  Risk to self with the past 6 months Suicidal Ideation: No Has patient been a risk to self within the past 6 months prior to admission? : No Suicidal Intent: No Has patient had any suicidal intent within the past 6 months prior to admission? : No Is patient at risk for suicide?: No Suicidal Plan?: No Has patient had any suicidal plan within the past 6 months prior to admission? : No Access to Means: No What has been your use of drugs/alcohol within the last 12 months?: Pt denies drug/alcohol use Previous Attempts/Gestures: No How many times?: 0 Other Self Harm Risks: none identified Triggers for Past Attempts: None known Intentional Self Injurious Behavior: None Family Suicide History: No Recent stressful life event(s): Other (Comment)(numerous group home placements) Persecutory voices/beliefs?: No Depression: No Substance abuse history and/or treatment for substance abuse?: No Suicide prevention information given to non-admitted patients: Not applicable  Risk to Others within the past 6 months Homicidal Ideation: No Does patient have any lifetime risk of violence toward others beyond the six months prior to admission? : No Thoughts of Harm to Others: No Current Homicidal Intent: No Current Homicidal Plan: No Access to Homicidal Means: No Identified Victim: none identified History of harm to others?: No Assessment of Violence: None  Noted Does patient have access to weapons?: No Criminal Charges Pending?: No Does patient have a court date: No Is patient on probation?: No  Psychosis Hallucinations: None noted Delusions: None noted  Mental Status Report Appearance/Hygiene:  Unremarkable, In scrubs Eye Contact: Good Motor Activity: Freedom of movement Speech: Logical/coherent Level of Consciousness: Alert Mood: Pleasant Affect: Appropriate to circumstance Anxiety Level: None Thought Processes: Relevant Judgement: Partial Orientation: Person, Place, Time Obsessive Compulsive Thoughts/Behaviors: None  Cognitive Functioning Concentration: Normal Memory: Remote Intact, Recent Intact IQ: Average Insight: Fair Impulse Control: Fair Appetite: Good Weight Loss: 0 Weight Gain: 0 Sleep: No Change Vegetative Symptoms: None  ADLScreening [facility] Assessment Services) Patient's cognitive ability adequate to safely complete daily activities?: Yes Patient able to express need for assistance with ADLs?: Yes Independently performs ADLs?: Yes (appropriate for developmental age)  Prior Inpatient Therapy Prior Inpatient Therapy: Yes Prior Therapy Dates: 2018 - multiple admissions Prior Therapy Facilty/Provider(s): [facility] Reason for Treatment: psychosis  Prior Outpatient Therapy Prior Outpatient Therapy: Yes Prior Therapy Dates: currently Prior Therapy Facilty/Provider(s): [facility] Reason for Treatment: schizoaffective d/o, med management Does patient have an ACCT team?: Yes Does patient have Intensive In-House Services?  : No Does patient have [facility] services? : No Does patient have P4CC services?: No  ADL Screening (condition at time of admission) Patient's cognitive ability adequate to safely complete daily activities?: Yes Patient able to express need for assistance with ADLs?: Yes Independently performs ADLs?: Yes (appropriate for developmental age)       Abuse/Neglect Assessment (Assessment to be complete while patient is alone) Abuse/Neglect Assessment Can Be Completed: Yes Physical Abuse: Denies Verbal Abuse: Denies Sexual Abuse: Denies Exploitation of patient/patient's resources: Denies Self-Neglect: Denies Values / Beliefs Cultural  Requests During Hospitalization: None Spiritual Requests During Hospitalization: None Consults Spiritual Care Consult Needed: No Social Work Consult Needed: No Merchant navy officer (For Healthcare) Does Patient Have a Medical Advance Directive?: No Would patient like information on creating a medical advance directive?: No - Patient declined    Additional Information 1:1 In Past 12 Months?: No CIRT Risk: No Elopement Risk: No Does patient have medical clearance?: Yes     Disposition:  Disposition Initial Assessment Completed for this Encounter: Yes Disposition of Patient: Pending Review with psychiatrist  On Site Evaluation by:   Reviewed with Physician:    [place] 05/17/2017 12:13 AM

## 2017-05-17 NOTE — ED Notes (Signed)
Pt. Transferred to [facility] from ED to room after screening for contraband. Report to include Situation, Background, Assessment and Recommendations from [facility] RN. Pt. Oriented to unit including Q15 minute rounds as well as the security cameras for their protection. Patient is alert and oriented, warm and dry in no acute distress. Patient denies SI, HI, and AVH. Pt. Encouraged to let me know if needs arise.

## 2017-05-19 ENCOUNTER — Encounter (HOSPITAL_COMMUNITY): Payer: Self-pay | Admitting: Emergency Medicine

## 2017-05-19 ENCOUNTER — Other Ambulatory Visit: Payer: Self-pay

## 2017-05-19 ENCOUNTER — Emergency Department (HOSPITAL_COMMUNITY)
Admission: EM | Admit: 2017-05-19 | Discharge: 2017-05-20 | Disposition: A | Discharge status: Home / Self Care | Payer: Medicaid Other | Attending: Emergency Medicine | Admitting: Emergency Medicine

## 2017-05-19 DIAGNOSIS — E119 Type 2 diabetes mellitus without complications: Secondary | ICD-10-CM | POA: Diagnosis not present

## 2017-05-19 DIAGNOSIS — F17210 Nicotine dependence, cigarettes, uncomplicated: Secondary | ICD-10-CM | POA: Insufficient documentation

## 2017-05-19 DIAGNOSIS — J069 Acute upper respiratory infection, unspecified: Secondary | ICD-10-CM | POA: Diagnosis not present

## 2017-05-19 DIAGNOSIS — I10 Essential (primary) hypertension: Secondary | ICD-10-CM | POA: Diagnosis not present

## 2017-05-19 DIAGNOSIS — Z79899 Other long term (current) drug therapy: Secondary | ICD-10-CM | POA: Diagnosis not present

## 2017-05-19 DIAGNOSIS — R0981 Nasal congestion: Secondary | ICD-10-CM | POA: Diagnosis present

## 2017-05-19 NOTE — ED Triage Notes (Addendum)
Pt states he is concerned regarding sore throat and stuffy nose. Pt also states he is homeless now d/t his family being Muslim and he is christian and they will not allow him to stay in home. Pt also states he is very sleepy and has not slept d/t fear of living on street.  Pt states he was recently d/c group home because he did not want to stay because they would not let him go anywhere.  Pt states he really just needs somewhere to sleep so he can enroll in school in the morning. Pt rambling, anxious.

## 2017-05-19 NOTE — ED Provider Notes (Signed)
[name]   CSN: [id number] Arrival date & time: 05/19/17  2148     History   Chief Complaint Chief Complaint  Patient presents with  . cold s/s    HPI John Copeland is a 27 y.o. male.  HPI   John Copeland is a 27 y.o. male, with a history of anxiety, depression, DM, and schizophrenia, presenting to the ED with nasal congestion, sore throat, and cough for the last week.   Patient adds he is homeless and does not have anywhere to sleep.  States he has not slept for days due to being on the street.  "I have not slept for days, sir.  I do not know how long it has been." "I do not take my medications because they are bad for me.  I really want to enroll in school.  Whichever one is easiest to get into.  I really want to finish my GED and go to dental school." Denies A/V hallucinations. Denies SI/HI.  Denies fever/chills, chest pain, shortness of breath, N/V/D, or any other complaints.     Past Medical History:  Diagnosis Date  . Anxiety   . Depression   . Diabetes mellitus without complication (HCC)   . Schizophrenia [facility](HCC)     Patient Active Problem List   Diagnosis Date Noted  . Schizophrenia (HCC) 03/29/2017  . 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 Diphenhydramine overdose of undetermined intent 01/10/2017  . Tobacco use disorder 10/07/2016  . Diabetes (HCC) 10/07/2016  . HTN (hypertension) 10/07/2016  . Dyslipidemia 10/07/2016  . Schizoaffective disorder, bipolar type (HCC) 11/30/2015    History reviewed. No pertinent surgical history.     Home Medications    Prior to Admission medications   Medication Sig Start Date End Date Taking? Authorizing Provider  ARIPiprazole (ABILIFY) 20 MG tablet Take 20 mg by mouth daily.    [provider]  atorvastatin (LIPITOR) 10 MG tablet Take 10 mg by mouth daily.    [provider]  benztropine (COGENTIN) 1 MG tablet Take 1 mg by mouth daily.     [provider]  diphenhydrAMINE (BENADRYL) 50 MG tablet Take 50 mg by mouth at bedtime.    [provider]  divalproex (DEPAKOTE) 500 MG DR tablet Take 500 mg by mouth every 12 (twelve) hours.    [provider]  fluvoxaMINE (LUVOX) 100 MG tablet Take 100 mg by mouth at bedtime.    [provider]  haloperidol (HALDOL) 1 MG tablet Take 1 mg by mouth 2 (two) times daily.    [provider]  lisinopril (PRINIVIL,ZESTRIL) 10 MG tablet Take 10 mg by mouth daily.    [provider]  Multiple Vitamin (MULTIVITAMIN) tablet Take 1 tablet by mouth daily.    [provider]  traZODone (DESYREL) 100 MG tablet Take 100 mg by mouth at bedtime.    [provider]    Family History No family history on file.  Social History Social History   Tobacco Use  . Smoking status: Current Every Day Smoker    Packs/day: 0.50    Types: Cigarettes  . Smokeless tobacco: Never Used  Substance Use Topics  . Alcohol use: No  . Drug use: No     Allergies   Patient has no known allergies.   Review of Systems Review of Systems  Constitutional: Negative for chills and fever.  HENT: Positive for congestion.   Psychiatric/Behavioral: Positive for agitation and sleep disturbance.  The patient is nervous/anxious and is hyperactive.   All other systems reviewed and are negative.    Physical Exam Updated Vital Signs BP (!) 159/80 (BP Location: Right Arm)   Pulse 91   Temp 98.4 F (36.9 C) (Oral)   Resp 16   Ht 6' (1.829 m)   Wt 77.1 kg (170 lb)   SpO2 100%   BMI 23.06 kg/m   Physical Exam  Constitutional: He is oriented to person, place, and time. He appears well-developed and well-nourished. No distress.  HENT:  Head: Normocephalic and atraumatic.  Eyes: Conjunctivae are normal.  Neck: Neck supple.  Cardiovascular: Normal rate, regular rhythm, normal heart sounds and intact distal pulses.  Pulmonary/Chest: Effort normal and  breath sounds normal. No respiratory distress.  Abdominal: Soft. There is no tenderness. There is no guarding.  Musculoskeletal: He exhibits no edema.  Lymphadenopathy:    He has no cervical adenopathy.  Neurological: He is alert and oriented to person, place, and time.  Skin: Skin is warm and dry. He is not diaphoretic.  Psychiatric: His mood appears anxious. His affect is labile. He is agitated (intermittent; redirectable) and hyperactive.  Patient appears anxious and is pacing around the room.  He repeatedly comes out into the hallway, to the nurses station, and around the unit. Patient is friendly and easily redirectable.  Nursing note and vitals reviewed.    ED Treatments / Results  Labs (all labs ordered are listed, but only abnormal results are displayed) Labs Reviewed  COMPREHENSIVE METABOLIC PANEL - Abnormal; Notable for the following components:      Result Value   AST 47 (*)    All other components within normal limits  CBC WITH DIFFERENTIAL/PLATELET - Abnormal; Notable for the following components:   Hemoglobin 11.4 (*)    HCT 35.7 (*)    MCV 73.8 (*)    MCH 23.6 (*)    All other components within normal limits  ETHANOL  RAPID URINE DRUG SCREEN, HOSP PERFORMED  CBG MONITORING, ED    EKG  EKG Interpretation None       Radiology No results found.  Procedures Procedures (including critical care time)  Medications Ordered in ED Medications  OLANZapine zydis (ZYPREXA) disintegrating tablet 10 mg (10 mg Oral Given 05/20/17 0040)     Initial Impression / Assessment and Plan / ED Course  I have reviewed the triage vital signs and the nursing notes.  Pertinent labs & imaging results that were available during my care of the patient were reviewed by me and considered in my medical decision making (see chart for details).  Clinical Course as of May 20 124  Mon May 20, 2017  0039 Spoke with Ala Dach, [facility] counselor. States patient does not meet inpatient criteria.  Patient's ACT team was contacted, state they will come here in the morning to meet up with him and assist him.  [SJ]    Clinical Course User Index [SJ] Joy, Shawn C, PA-C    Patient presents with symptoms of URI. Patient was assessed further by behavioral health team due to his noted behavior.  Not recommended for inpatient treatment.  Patient is fully alert and oriented. Does not seem to be a danger to himself or others as of this assessment. Patient offered medication to assist with his intermittent agitation and sleeplessness.  Return precautions discussed. Patient voices understanding of these instructions and is comfortable with discharge.   Findings and plan of care discussed with Azalia Bilis, MD. Dr. Patria Mane personally  evaluated and examined this patient.   Final Clinical Impressions(s) / ED Diagnoses   Final diagnoses:  Upper respiratory tract infection, unspecified type    ED Discharge Orders    None       Concepcion Living 05/20/17 Gavin Potters, MD 05/20/17 [phone]

## 2017-05-20 ENCOUNTER — Encounter (HOSPITAL_COMMUNITY): Payer: Self-pay | Admitting: Emergency Medicine

## 2017-05-20 ENCOUNTER — Emergency Department (HOSPITAL_COMMUNITY)
Admission: EM | Admit: 2017-05-20 | Discharge: 2017-05-20 | Discharge status: Against Medical Advice | Payer: Medicaid Other | Source: Home / Self Care

## 2017-05-20 ENCOUNTER — Other Ambulatory Visit: Payer: Self-pay

## 2017-05-20 DIAGNOSIS — F209 Schizophrenia, unspecified: Secondary | ICD-10-CM

## 2017-05-20 LAB — CBC WITH DIFFERENTIAL/PLATELET
BASOS PCT: 1 %
Basophils Absolute: 0.1 10*3/uL (ref 0.0–0.1)
EOS ABS: 0.1 10*3/uL (ref 0.0–0.7)
EOS PCT: 1 %
HCT: 35.7 % — ABNORMAL LOW (ref 39.0–52.0)
HEMOGLOBIN: 11.4 g/dL — AB (ref 13.0–17.0)
LYMPHS PCT: 38 %
Lymphs Abs: 3.4 10*3/uL (ref 0.7–4.0)
MCH: 23.6 pg — AB (ref 26.0–34.0)
MCHC: 31.9 g/dL (ref 30.0–36.0)
MCV: 73.8 fL — AB (ref 78.0–100.0)
MONO ABS: 0.9 10*3/uL (ref 0.1–1.0)
Monocytes Relative: 10 %
NEUTROS PCT: 50 %
Neutro Abs: 4.4 10*3/uL (ref 1.7–7.7)
PLATELETS: 307 10*3/uL (ref 150–400)
RBC: 4.84 MIL/uL (ref 4.22–5.81)
RDW: 14.1 % (ref 11.5–15.5)
WBC: 8.9 10*3/uL (ref 4.0–10.5)

## 2017-05-20 LAB — COMPREHENSIVE METABOLIC PANEL
ALT: 36 U/L (ref 17–63)
AST: 47 U/L — AB (ref 15–41)
Albumin: 4 g/dL (ref 3.5–5.0)
Alkaline Phosphatase: 49 U/L (ref 38–126)
Anion gap: 10 (ref 5–15)
BUN: 7 mg/dL (ref 6–20)
CO2: 25 mmol/L (ref 22–32)
CREATININE: 0.89 mg/dL (ref 0.61–1.24)
Calcium: 9.3 mg/dL (ref 8.9–10.3)
Chloride: 102 mmol/L (ref 101–111)
Glucose, Bld: 72 mg/dL (ref 65–99)
Potassium: 4 mmol/L (ref 3.5–5.1)
Sodium: 137 mmol/L (ref 135–145)
Total Bilirubin: 0.8 mg/dL (ref 0.3–1.2)
Total Protein: 6.6 g/dL (ref 6.5–8.1)

## 2017-05-20 LAB — CBG MONITORING, ED: GLUCOSE-CAPILLARY: 86 mg/dL (ref 65–99)

## 2017-05-20 LAB — RAPID URINE DRUG SCREEN, HOSP PERFORMED
Amphetamines: NOT DETECTED
Barbiturates: NOT DETECTED
Benzodiazepines: NOT DETECTED
Cocaine: NOT DETECTED
OPIATES: NOT DETECTED
Tetrahydrocannabinol: NOT DETECTED

## 2017-05-20 LAB — ETHANOL

## 2017-05-20 MED ORDER — OLANZAPINE 10 MG PO TBDP
10.0000 mg | ORAL_TABLET | Freq: Once | ORAL | Status: AC
  Administered 2017-05-20: 10 mg via ORAL
  Filled 2017-05-20: qty 1

## 2017-05-20 NOTE — ED Triage Notes (Signed)
Per EMS- patient called stating he had cold sx.. Patient told EMS that he took too many Coricidin, states he took 5416 coricidin this AM when he woke. Patient reported that he has been taking them every day since he was 18 and needed detox.

## 2017-05-20 NOTE — ED Notes (Signed)
Pt requesting "cold" water; pt provided with 2 cups of ice water

## 2017-05-20 NOTE — ED Provider Notes (Signed)
[facility] [place] [facility]   CSN: [id number] Arrival date & time: 05/20/17  0137     History   Chief Complaint Chief Complaint  Patient presents with  . "feeling funny"    HPI John MuffJoshua Omar Copeland is a 27 y.o. male.  HPI Patient has a history of anxiety, depression, diabetes, and schizophrenia.  Patient initially presented to the emergency room last evening.  Patient is homeless.  He indicated that he did not have a place to sleep.  He has not slept well because he has been living out on the street.  Patient admitted that he was not taking his medications because they felt they were bad for him.  He denied any trouble with suicidal or homicidal ideation.  He denied any hallucinations.  Patient was medically screened.  Psychiatry, TTS was consulted.  The patient required inpatient admission.  They contacted the patient's personal act team who indicated that they would come here in the morning to pick him up.  Patient was discharged.  The patient remained out in the waiting room and then checked himself back in because he stated he was feeling funny.  I evaluated the patient this morning after he has been sleeping in 1 of the triage beds.  Patient denies any complaints currently. Past Medical History:  Diagnosis Date  . Anxiety   . Depression   . Diabetes mellitus without complication (HCC)   . Schizophrenia [facility](HCC)     Patient Active Problem List   Diagnosis Date Noted  . Schizophrenia (HCC) 03/29/2017  . 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 Diphenhydramine overdose of undetermined intent 01/10/2017  . Tobacco use disorder 10/07/2016  . Diabetes (HCC) 10/07/2016  . HTN (hypertension) 10/07/2016  . Dyslipidemia 10/07/2016  . Schizoaffective disorder, bipolar type (HCC) 11/30/2015    History reviewed. No pertinent surgical history.     Home Medications    Prior to Admission medications   Medication Sig Start Date End Date Taking? Authorizing  Provider  ARIPiprazole (ABILIFY) 20 MG tablet Take 20 mg by mouth daily.    [provider]  atorvastatin (LIPITOR) 10 MG tablet Take 10 mg by mouth daily.    [provider]  benztropine (COGENTIN) 1 MG tablet Take 1 mg by mouth daily.    [provider]  diphenhydrAMINE (BENADRYL) 50 MG tablet Take 50 mg by mouth at bedtime.    [provider]  divalproex (DEPAKOTE) 500 MG DR tablet Take 500 mg by mouth every 12 (twelve) hours.    [provider]  fluvoxaMINE (LUVOX) 100 MG tablet Take 100 mg by mouth at bedtime.    [provider]  haloperidol (HALDOL) 1 MG tablet Take 1 mg by mouth 2 (two) times daily.    [provider]  lisinopril (PRINIVIL,ZESTRIL) 10 MG tablet Take 10 mg by mouth daily.    [provider]  Multiple Vitamin (MULTIVITAMIN) tablet Take 1 tablet by mouth daily.    [provider]  traZODone (DESYREL) 100 MG tablet Take 100 mg by mouth at bedtime.    [provider]    Family History No family history on file.  Social History Social History   Tobacco Use  . Smoking status: Current Every Day Smoker    Packs/day: 0.50    Types: Cigarettes  . Smokeless tobacco: Never Used  Substance Use Topics  . Alcohol use: No  . Drug use: No     Allergies   Patient has no known allergies.  Review of Systems Review of Systems  All other systems reviewed and are negative.    Physical Exam Updated Vital Signs BP (!) 161/98 (BP Location: Right Arm)   Pulse (!) 101   Temp 98.6 F (37 C) (Oral)   Resp 16   SpO2 96%   Physical Exam  Constitutional: He appears well-developed and well-nourished. No distress.  Patient is sleeping, woke up easily  HENT:  Head: Normocephalic and atraumatic.  Right Ear: External ear normal.  Left Ear: External ear normal.  Eyes: Conjunctivae are normal. Right eye exhibits no discharge. Left eye exhibits no discharge. No scleral icterus.  Neck:  Neck supple. No tracheal deviation present.  Cardiovascular: Normal rate, regular rhythm and intact distal pulses.  Pulmonary/Chest: Effort normal and breath sounds normal. No stridor. No respiratory distress. He has no wheezes. He has no rales.  Abdominal: Soft. Bowel sounds are normal. He exhibits no distension. There is no tenderness. There is no rebound and no guarding.  Musculoskeletal: He exhibits no edema or tenderness.  Neurological: He is alert. He has normal strength. No cranial nerve deficit (no facial droop, extraocular movements intact, no slurred speech) or sensory deficit. He exhibits normal muscle tone. He displays no seizure activity. Coordination normal.  Skin: Skin is warm and dry. No rash noted.  Psychiatric: His affect is not angry and not labile. His speech is not slurred. He is not hyperactive. He does not exhibit a depressed mood. He expresses no homicidal ideation.  Nursing note and vitals reviewed.    ED Treatments / Results   Procedures Procedures (including critical care time)  Medications Ordered in ED Medications - No data to display   Initial Impression / Assessment and Plan / ED Course  I have reviewed the triage vital signs and the nursing notes.  Pertinent labs & imaging results that were available during my care of the patient were reviewed by me and considered in my medical decision making (see chart for details).  Clinical Course as of May 20 822  Mon May 20, 2017  1610 Labs from previous visit reviewed  [JK]    Clinical Course User Index [JK] John Dibbles, MD  Patient has chronic mental health problems.  He does not appear to be a threat to himself or others.  Patient is stable for discharge.  We will wait for his acting to pick him up.  Final Clinical Impressions(s) / ED Diagnoses   Final diagnoses:  Schizophrenia, unspecified type [facility])    ED Discharge Orders    None       John Dibbles, MD 05/20/17 [phone]

## 2017-05-20 NOTE — ED Triage Notes (Signed)
Patient put coat and backpack on and stated he was going to catch the train to [place] and go to a detox in [place]. Patient left the triage area.

## 2017-05-20 NOTE — ED Triage Notes (Signed)
Pt checked back in stating that he is "feeling funny",unable to articulate any more.  Pt has pressured speech, unable to sit still.

## 2017-05-20 NOTE — Discharge Instructions (Signed)
The behavioral health team states you do not need to stay in the hospital. Your ACT team will meet you here in the lobby tomorrow morning.   Cold symptoms: Your symptoms are consistent with a viral illness or allergies. Viruses do not require antibiotics. Treatment is symptomatic care and it is important to note that these symptoms may last for 7-14 days.   Hand washing: Wash your hands throughout the day, but especially before and after touching the face, using the restroom, sneezing, coughing, or touching surfaces that have been coughed or sneezed upon. Hydration: Symptoms will be intensified and complicated by dehydration. Dehydration can also extend the duration of symptoms. Drink plenty of fluids and get plenty of rest. You should be drinking at least half a liter of water an hour to stay hydrated. Electrolyte drinks (ex. Gatorade, Powerade, Pedialyte) are also encouraged. You should be drinking enough fluids to make your urine light yellow, almost clear. If this is not the case, you are not drinking enough water. Please note that some of the treatments indicated below will not be effective if you are not adequately hydrated. Pain or fever: Ibuprofen, Naproxen, or Tylenol for pain or fever.  Zyrtec or Claritin: May add these medication daily to control underlying symptoms of congestion, sneezing, and other signs of allergies. Flonase: Use this medication, as directed, for nasal and sinus congestion. Congestion: Plain Mucinex may help relieve congestion. Saline sinus rinses and saline nasal sprays may also help relieve congestion. If you do not have heart problems or an allergy to such medications, you may also try phenylephrine or Sudafed. Sore throat: Warm liquids or Chloraseptic spray may help soothe a sore throat. Gargle twice a day with a salt water solution made from a half teaspoon of salt in a cup of warm water.  Follow up: Follow up with a primary care provider, as needed, for any future  management of this issue.

## 2017-05-20 NOTE — ED Notes (Signed)
Called and no answer.

## 2017-05-20 NOTE — ED Triage Notes (Signed)
Patient arrived with [employer] from a local restaurant ( homeless) reports sore throat / nasal congestion for several days , denies fever or chills .

## 2017-05-20 NOTE — BH Assessment (Addendum)
Tele Assessment Note   Patient Name: John Copeland MRN: [id number] Referring Physician: Anselm PancoastShawn C Joy, PA-C [facility]  [facility] Location of Provider: Behavioral Health TTS Department  Bethann HumbleJoshua Omar Dreama Copeland is an 27 y.o. single male who [facility] requesting assistance with sinus problem and homelessness. Pt has diagnosis of schizophrenia and was residing in a group home but left a few days ago because he didn't like the people there and felt it was too restrictive. [name].  Pt's medical record indicates he was petitioned for IVC on 05/17/17, evaluated [facility], psychiatrically cleared and discharged to a homeless shelter.   Pt states he wants assistance with housing. He says he has a plan to find a place to live, enroll in school to complete his GED, attend college and study biology and then become a dentist. He denies depressive symptoms. He denies current suicidal ideation. He denies current thoughts of harming others. He denies current auditory or visual hallucinations. Pt reports he has an aunt and uncle in [place] who are supportive. Pt denies legal problems. He denies history of abuse. He denies access to firearms.  Pt is currently receiving ACTT services through [facility] [phone]. Pt says he stopped taking his psychiatric medications because he believes he is smarter without them. Pt's medical record indicates he was psychiatrically hospitalized several times in 2018 and his last inpatient admission was 04/28/17 at [facility].  Pt is dressed in hospital scrubs, alert and oriented x4. Pt speaks in a clear tone, at moderate volume and normal pace. Motor behavior appears normal. Eye contact is good. Pt's mood is euthymic and affect is somewhat anxious and labile. Thought process is coherent and relevant. Insight and judgment are limited. Pt was cooperative throughout assessment. He does not want psychiatric  treatment and says he is only interested in housing.    Diagnosis: Schizophrenia  Past Medical History:  Past Medical History:  Diagnosis Date  . Anxiety   . Depression   . Diabetes mellitus without complication (HCC)   . Schizophrenia (HCC)     History reviewed. No pertinent surgical history.  Family History: No family history on file.  Social History:  reports that he has been smoking cigarettes.  He has been smoking about 0.50 packs per day. he has never used smokeless tobacco. He reports that he does not drink alcohol or use drugs.  Additional Social History:  Alcohol / Drug Use Pain Medications: pt denies abuse - see ptamed list Prescriptions: pt denies abuse - see pta meds list Over the Counter: pt denies abuse - see pta meds list History of alcohol / drug use?: No history of alcohol / drug abuse Longest period of sobriety (when/how long): NA  CIWA: CIWA-Ar BP: (!) 159/80 Pulse Rate: 91 COWS:    PATIENT STRENGTHS: (choose at least two) Ability for insight Average or above average intelligence Communication skills Physical Health  Allergies: No Known Allergies  Home Medications:  (Not in a hospital admission)  OB/GYN Status:  No LMP for male patient.  General Assessment Data [facility] T[facility] Assessment: In system Is this a Tele or Face-to-Face Assessment?: Tele Assessment Is this an Initial Assessment or a Re-assessment for this encounter?: Initial Assessment Marital status: Single Maiden name: NA Is patient pregnant?: No Pregnancy Status: No Living Arrangements: Other (Comment)(Homeless) Can pt return to current living arrangement?: Yes Admission Status: Voluntary Is patient capable of signing voluntary  admission?: Yes Referral Source: Self/Family/Friend Insurance type: Medicaid     Crisis Care Plan Living Arrangements: Other (Comment)(Homeless) Legal Guardian: Other:([place] [facility]) Name of Psychiatrist: [facility] Name of  Therapist: [facility]  Education Status Is patient currently in school?: No Current Grade: NA Highest grade of school patient has completed: 9 Name of school: n/a Contact person: n/a  Risk to self with the past 6 months Suicidal Ideation: No Has patient been a risk to self within the past 6 months prior to admission? : No Suicidal Intent: No Has patient had any suicidal intent within the past 6 months prior to admission? : No Is patient at risk for suicide?: No Suicidal Plan?: No Has patient had any suicidal plan within the past 6 months prior to admission? : No Access to Means: No What has been your use of drugs/alcohol within the last 12 months?: None reported Previous Attempts/Gestures: No How many times?: 0 Other Self Harm Risks: None Triggers for Past Attempts: None known Intentional Self Injurious Behavior: None Family Suicide History: No Recent stressful life event(s): Other (Comment)(Left group home) Persecutory voices/beliefs?: No Depression: No Depression Symptoms: (Pt denies depressive symptoms) Substance abuse history and/or treatment for substance abuse?: No Suicide prevention information given to non-admitted patients: Not applicable  Risk to Others within the past 6 months Homicidal Ideation: No Does patient have any lifetime risk of violence toward others beyond the six months prior to admission? : No Thoughts of Harm to Others: No Current Homicidal Intent: No Current Homicidal Plan: No Access to Homicidal Means: No Identified Victim: None History of harm to others?: No Assessment of Violence: None Noted Violent Behavior Description: Pt denies history of violence Does patient have access to weapons?: No Criminal Charges Pending?: No Does patient have a court date: No Is patient on probation?: No  Psychosis Hallucinations: None noted Delusions: None noted  Mental Status Report Appearance/Hygiene: In scrubs Eye Contact: Good Motor Activity:  Unremarkable Speech: Logical/coherent Level of Consciousness: Alert Mood: Euthymic, Pleasant Affect: Anxious, Labile Anxiety Level: Minimal Thought Processes: Coherent, Relevant Judgement: Partial Orientation: Person, Place, Time, Situation Obsessive Compulsive Thoughts/Behaviors: None  Cognitive Functioning Concentration: Fair Memory: Recent Intact, Remote Intact IQ: Average Insight: Poor Impulse Control: Fair Appetite: Fair Weight Loss: 0 Weight Gain: 0 Sleep: Decreased Total Hours of Sleep: 5 Vegetative Symptoms: None  ADLScreening [facility] Assessment Services) Patient's cognitive ability adequate to safely complete daily activities?: Yes Patient able to express need for assistance with ADLs?: Yes Independently performs ADLs?: Yes (appropriate for developmental age)  Prior Inpatient Therapy Prior Inpatient Therapy: Yes Prior Therapy Dates: 2018 - multiple admissions Prior Therapy Facilty/Provider([facility]): [facility] Reason for Treatment: psychosis  Prior Outpatient Therapy Prior Outpatient Therapy: Yes Prior Therapy Dates: currently Prior Therapy Facilty/Provider(s): [facility] Reason for Treatment: schizoaffective d/o, med management Does patient have an ACCT team?: Yes Does patient have Intensive In-House Services?  : No Does patient have [facility] services? : No Does patient have P4CC services?: No  ADL Screening (condition at time of admission) Patient's cognitive ability adequate to safely complete daily activities?: Yes Is the patient deaf or have difficulty hearing?: No Does the patient have difficulty seeing, even when wearing glasses/contacts?: No Does the patient have difficulty concentrating, remembering, or making decisions?: Yes Patient able to express need for assistance with ADLs?: Yes Does the patient have difficulty dressing or bathing?: No Independently performs ADLs?: Yes (appropriate for developmental age) Does the patient have difficulty walking or  climbing stairs?: No Weakness of Legs:  None Weakness of Arms/Hands: None       Abuse/Neglect Assessment (Assessment to be complete while patient is alone) Abuse/Neglect Assessment Can Be Completed: Yes Physical Abuse: Denies Verbal Abuse: Denies Sexual Abuse: Denies Exploitation of patient/patient's resources: Denies Self-Neglect: Denies     Merchant navy officer (For Healthcare) Does Patient Have a Medical Advance Directive?: No Would patient like information on creating a medical advance directive?: No - Patient declined    Additional Information 1:1 In Past 12 Months?: No CIRT Risk: No Elopement Risk: No Does patient have medical clearance?: Yes     Disposition: Gave clinical report to Nira Conn, NP who said Pt does not meet criteria for inpatient psychiatric treatment. Contacted [facility] with [facility] [phone] who said if Pt is held overnight their staff will pick up Pt in the morning. Notified Harolyn Rutherford, PA-C who agreed to keep Pt overnight. Notified [facility] RN.  Disposition Initial Assessment Completed for this Encounter: Yes Disposition of Patient: Other dispositions Other disposition(s): To current provider  This service was provided via telemedicine using a 2-way, interactive audio and video technology.  Names of all persons participating in this telemedicine service and their role in this encounter. Name: John Copeland Role: Patient  Name: Shela Commons, [place] Role: TTS counselor         Harlin Rain Patsy [place], [facility], [facility], [facility] Triage Specialist [phone]  Pamalee Leyden 05/20/2017 12:57 AM

## 2017-05-20 NOTE — ED Notes (Signed)
Pt in Room A5

## 2017-05-20 NOTE — Progress Notes (Addendum)
CSW went by to speak with pt at bedside 2 times. Pt was not in the room at this time. CSW reached out to [facility] ACT team and was made aware that they do not have anyone on their roster by this name. At this time there are no further CSW needs. Please re consult if new need arises. CSW signing off.     Claude MangesKierra S. Matheau Orona, MSW, LCSW-A Emergency Department Clinical Social Worker [phone]

## 2017-05-21 ENCOUNTER — Emergency Department (HOSPITAL_COMMUNITY)
Admission: EM | Admit: 2017-05-21 | Discharge: 2017-05-21 | Disposition: A | Discharge status: Home / Self Care | Payer: Medicaid Other | Source: Home / Self Care

## 2017-05-21 HISTORY — DX: Homelessness: Z59.0

## 2017-05-21 HISTORY — DX: Homelessness unspecified: Z59.00

## 2017-05-21 LAB — RAPID STREP SCREEN (MED CTR MEBANE ONLY): STREPTOCOCCUS, GROUP A SCREEN (DIRECT): NEGATIVE

## 2017-05-21 NOTE — ED Notes (Signed)
no answer x2 for room

## 2017-05-23 LAB — CULTURE, GROUP A STREP (THRC)

## 2017-05-24 ENCOUNTER — Encounter: Payer: Self-pay | Admitting: Licensed Clinical Social Worker

## 2017-05-24 ENCOUNTER — Encounter (HOSPITAL_COMMUNITY): Payer: Self-pay | Admitting: Emergency Medicine

## 2017-05-24 ENCOUNTER — Emergency Department (HOSPITAL_COMMUNITY)
Admission: EM | Admit: 2017-05-24 | Discharge: 2017-05-24 | Disposition: A | Discharge status: Home / Self Care | Payer: Medicaid Other | Attending: Emergency Medicine | Admitting: Emergency Medicine

## 2017-05-24 DIAGNOSIS — J069 Acute upper respiratory infection, unspecified: Secondary | ICD-10-CM | POA: Diagnosis not present

## 2017-05-24 DIAGNOSIS — E119 Type 2 diabetes mellitus without complications: Secondary | ICD-10-CM | POA: Diagnosis not present

## 2017-05-24 DIAGNOSIS — R05 Cough: Secondary | ICD-10-CM | POA: Diagnosis present

## 2017-05-24 DIAGNOSIS — F17210 Nicotine dependence, cigarettes, uncomplicated: Secondary | ICD-10-CM | POA: Diagnosis not present

## 2017-05-24 DIAGNOSIS — I10 Essential (primary) hypertension: Secondary | ICD-10-CM | POA: Diagnosis not present

## 2017-05-24 DIAGNOSIS — Z5321 Procedure and treatment not carried out due to patient leaving prior to being seen by health care provider: Secondary | ICD-10-CM

## 2017-05-24 DIAGNOSIS — B9789 Other viral agents as the cause of diseases classified elsewhere: Secondary | ICD-10-CM | POA: Insufficient documentation

## 2017-05-24 DIAGNOSIS — R109 Unspecified abdominal pain: Secondary | ICD-10-CM

## 2017-05-24 DIAGNOSIS — Z79899 Other long term (current) drug therapy: Secondary | ICD-10-CM | POA: Diagnosis not present

## 2017-05-24 LAB — CBC
HEMATOCRIT: 38.1 % — AB (ref 39.0–52.0)
Hemoglobin: 12.2 g/dL — ABNORMAL LOW (ref 13.0–17.0)
MCH: 23.9 pg — ABNORMAL LOW (ref 26.0–34.0)
MCHC: 32 g/dL (ref 30.0–36.0)
MCV: 74.6 fL — AB (ref 78.0–100.0)
Platelets: 378 10*3/uL (ref 150–400)
RBC: 5.11 MIL/uL (ref 4.22–5.81)
RDW: 14.5 % (ref 11.5–15.5)
WBC: 7.9 10*3/uL (ref 4.0–10.5)

## 2017-05-24 LAB — URINALYSIS, ROUTINE W REFLEX MICROSCOPIC
Bilirubin Urine: NEGATIVE
Glucose, UA: NEGATIVE mg/dL
Hgb urine dipstick: NEGATIVE
Ketones, ur: NEGATIVE mg/dL
Leukocytes, UA: NEGATIVE
Nitrite: NEGATIVE
Protein, ur: NEGATIVE mg/dL
SPECIFIC GRAVITY, URINE: 1.003 — AB (ref 1.005–1.030)
pH: 6 (ref 5.0–8.0)

## 2017-05-24 MED ORDER — PROMETHAZINE-DM 6.25-15 MG/5ML PO SYRP: 5.0000 mL | ORAL_SOLUTION | Freq: Four times a day (QID) | ORAL | 0 refills | Status: DC | PRN

## 2017-05-24 MED ORDER — GUAIFENESIN ER 1200 MG PO TB12: 1.0000 | ORAL_TABLET | Freq: Two times a day (BID) | ORAL | 0 refills | Status: DC

## 2017-05-24 NOTE — ED Notes (Signed)
Patient states he needs to leave to go to the shelter to get a bed, this RN requested that patient stay until a provider could see him, pt agreed. PA Lawyer made aware pt wanted to leave but agreed to stay a few more minutes to be seen by provider

## 2017-05-24 NOTE — ED Provider Notes (Signed)
[name] EMER[facility]ENCY [facility] Provider Note   CSN: [id number] Arrival date & time: 05/24/17  0157     History   Chief Complaint Chief Complaint  Patient presents with  . Cold Symptoms  . Medication Refill    HPI John MuffJoshua Omar Copeland is a 27 y.o. male.  HPI Patient presents to the emergency department with a 2-week history of cough nasal congestion and nasal drainage.  The patient states that he is here today mainly for being cold while outside due to the fact that he is homeless.  The patient states that he also would like some medications for his cold symptoms.  Patient states nothing seems to make the condition better or worse.  Patient states he did not take any medications prior to arrival.  The patient denies chest pain, shortness of breath, headache,blurred vision, neck pain, fever,  weakness, numbness, dizziness, anorexia, edema, abdominal pain, nausea, vomiting, diarrhea, rash, back pain, dysuria, hematemesis, bloody stool, near syncope, or syncope. Past Medical History:  Diagnosis Date  . Anxiety   . Depression   . Diabetes mellitus without complication (HCC)   . Homelessness   . Schizophrenia [facility](HCC)     Patient Active Problem List   Diagnosis Date Noted  . Schizophrenia (HCC) 03/29/2017  . 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 Diphenhydramine overdose of undetermined intent 01/10/2017  . Tobacco use disorder 10/07/2016  . Diabetes (HCC) 10/07/2016  . HTN (hypertension) 10/07/2016  . Dyslipidemia 10/07/2016  . Schizoaffective disorder, bipolar type (HCC) 11/30/2015    History reviewed. No pertinent surgical history.     Home Medications    Prior to Admission medications   Medication Sig Start Date End Date Taking? Authorizing Provider  ARIPiprazole (ABILIFY) 20 MG tablet Take 20 mg by mouth daily.    [provider]  atorvastatin (LIPITOR) 10 MG tablet Take 10 mg by mouth daily.    [provider]  benztropine (COGENTIN) 1 MG  tablet Take 1 mg by mouth daily.    [provider]  diphenhydrAMINE (BENADRYL) 50 MG tablet Take 50 mg by mouth at bedtime.    [provider]  divalproex (DEPAKOTE) 500 MG DR tablet Take 500 mg by mouth every 12 (twelve) hours.    [provider]  fluvoxaMINE (LUVOX) 100 MG tablet Take 100 mg by mouth at bedtime.    [provider]  Guaifenesin 1200 MG TB12 Take 1 tablet (1,200 mg total) by mouth 2 (two) times daily. 05/24/17   Robert Sperl, Cristal Deerhristopher, PA-C  haloperidol (HALDOL) 1 MG tablet Take 1 mg by mouth 2 (two) times daily.    [provider]  lisinopril (PRINIVIL,ZESTRIL) 10 MG tablet Take 10 mg by mouth daily.    [provider]  Multiple Vitamin (MULTIVITAMIN) tablet Take 1 tablet by mouth daily.    [provider]  promethazine-dextromethorphan (PROMETHAZINE-DM) 6.25-15 MG/5ML syrup Take 5 mLs by mouth 4 (four) times daily as needed for cough. 05/24/17   Erminie Foulks, Cristal Deerhristopher, PA-C  traZODone (DESYREL) 100 MG tablet Take 100 mg by mouth at bedtime.    [provider]    Family History No family history on file.  Social History Social History   Tobacco Use  . Smoking status: Current Every Day Smoker    Packs/day: 0.00    Types: Cigarettes  . Smokeless tobacco: Never Used  Substance Use Topics  . Alcohol use: No  . Drug use: No     Allergies   Patient has no known allergies.  Review of Systems Review of Systems All other systems negative except as documented in the HPI. All pertinent positives and negatives as reviewed in the HPI.  Physical Exam Updated Vital Signs BP (!) 141/74 (BP Location: Right Arm)   Pulse 81   Temp 98.2 F (36.8 C) (Oral)   Resp 16   SpO2 100%   Physical Exam  Constitutional: He is oriented to person, place, and time. He appears well-developed and well-nourished. No distress.  HENT:  Head: Normocephalic and atraumatic.  Mouth/Throat: Oropharynx is clear and moist.    Eyes: Pupils are equal, round, and reactive to light.  Cardiovascular: Normal rate and regular rhythm. Exam reveals no friction rub.  No murmur heard. Pulmonary/Chest: Effort normal and breath sounds normal. No stridor. No respiratory distress. He has no wheezes. He has no rales.  Neurological: He is alert and oriented to person, place, and time.  Skin: Skin is warm and dry.  Psychiatric: He has a normal mood and affect.  Nursing note and vitals reviewed.    ED Treatments / Results  Labs (all labs ordered are listed, but only abnormal results are displayed) Labs Reviewed - No data to display  EKG  EKG Interpretation None       Radiology No results found.  Procedures Procedures (including critical care time)  Medications Ordered in ED Medications - No data to display   Initial Impression / Assessment and Plan / ED Course  I have reviewed the triage vital signs and the nursing notes.  Pertinent labs & imaging results that were available during my care of the patient were reviewed by me and considered in my medical decision making (see chart for details).     Patient will be treated for viral URI told return here as needed.  Patient is given symptomatic treatment for the symptoms.  Told to increase his fluid intake.  Final Clinical Impressions(s) / ED Diagnoses   Final diagnoses:  Viral URI with cough    ED Discharge Orders        Ordered    promethazine-dextromethorphan (PROMETHAZINE-DM) 6.25-15 MG/5ML syrup  4 times daily PRN     05/24/17 0919    Guaifenesin 1200 MG TB12  2 times daily     05/24/17 0919       Charlestine Night, PA-C 05/24/17 1533    Eber Hong, MD 05/25/17 [phone]

## 2017-05-24 NOTE — ED Notes (Signed)
Provider at bedside

## 2017-05-24 NOTE — ED Triage Notes (Signed)
BIB EMS reporting abd pain after drinking a monster energy drink. Pt also states he wants to speak to a psychiatrist.

## 2017-05-24 NOTE — Progress Notes (Signed)
  [facility] informed front desk he was cold and needed a coat and transportation back to the shelter.  LCSW checked chart.  Patient was in the ED and left.  Called ED Social Worker for coordination of care.  Patient was not discharged from the Hospital and should return.  Patient was recently discharged from his group home to the shelter, he has an ACT team and legal guardian.  [facility].  Plan:  ED Social Worker will assist patient with a coat and getting him connected to his ACT team.  Sammuel Hineseborah Pranit Owensby, LCSW Licensed Clinical Social Worker [facility]   [phone] 9:36 AM

## 2017-05-24 NOTE — Progress Notes (Addendum)
9:30am- Reached out to [employer] pt's legal guardian and was informed that pt is set up with [facility] team. CSW reached out to [facility] and Syl[facility]Angel [phone] at [facility] and left VM with front desk to have them call CSW back ASAP to discuss further needs of pt at this time. CSW provided pt with materials and pt has left the ED [facility] if [facility]. At this time there are no further CSW needs. CSW signing off.   CSW spoke with Gavin Poundeborah from [facility] and was informed that pt had show up to the office asking for a coat and food. CSW explained to Gavin PoundDeborah that that was checked in here in the ED and must have left before being discharged. CSW informed Gavin PoundDeborah to send pt back to the ED for coats, discharge paperwork, and for bus pass to get pt back to the shelter as requested by pt. CSW will give pt needed material when pt arrives back to the ED.    Claude MangesKierra S. Atalie Oros, MSW, LCSW-A Emergency Department Clinical Social Worker [phone]

## 2017-05-24 NOTE — Discharge Instructions (Signed)
Return here as needed.  Follow-up with the [facility] °

## 2017-05-24 NOTE — ED Notes (Signed)
Patient left without discharge papers.

## 2017-05-24 NOTE — ED Triage Notes (Signed)
Patient arrived with EMS from homeless shelter reports cold symptoms: nasal congestion / productive cough this week , also requesting prescriptions for his medications .

## 2017-05-25 ENCOUNTER — Encounter (HOSPITAL_COMMUNITY): Payer: Self-pay

## 2017-05-25 ENCOUNTER — Emergency Department (HOSPITAL_COMMUNITY)
Admission: EM | Admit: 2017-05-25 | Discharge: 2017-05-25 | Disposition: A | Discharge status: Home / Self Care | Payer: Medicaid Other | Source: Home / Self Care

## 2017-05-25 ENCOUNTER — Emergency Department (HOSPITAL_COMMUNITY)
Admission: EM | Admit: 2017-05-25 | Discharge: 2017-05-26 | Disposition: A | Discharge status: Home / Self Care | Payer: Medicaid Other | Source: Home / Self Care | Attending: Emergency Medicine | Admitting: Emergency Medicine

## 2017-05-25 ENCOUNTER — Encounter (HOSPITAL_COMMUNITY): Payer: Self-pay | Admitting: Emergency Medicine

## 2017-05-25 ENCOUNTER — Other Ambulatory Visit: Payer: Self-pay

## 2017-05-25 DIAGNOSIS — Z765 Malingerer [conscious simulation]: Secondary | ICD-10-CM

## 2017-05-25 LAB — COMPREHENSIVE METABOLIC PANEL
ALT: 36 U/L (ref 17–63)
AST: 39 U/L (ref 15–41)
Albumin: 4.2 g/dL (ref 3.5–5.0)
Alkaline Phosphatase: 59 U/L (ref 38–126)
Anion gap: 10 (ref 5–15)
BUN: 11 mg/dL (ref 6–20)
CO2: 23 mmol/L (ref 22–32)
Calcium: 9.7 mg/dL (ref 8.9–10.3)
Chloride: 103 mmol/L (ref 101–111)
Creatinine, Ser: 0.95 mg/dL (ref 0.61–1.24)
Glucose, Bld: 95 mg/dL (ref 65–99)
POTASSIUM: 3.9 mmol/L (ref 3.5–5.1)
Sodium: 136 mmol/L (ref 135–145)
Total Bilirubin: 0.6 mg/dL (ref 0.3–1.2)
Total Protein: 7.1 g/dL (ref 6.5–8.1)

## 2017-05-25 LAB — LIPASE, BLOOD: Lipase: 40 U/L (ref 11–51)

## 2017-05-25 NOTE — ED Notes (Signed)
Pt "they asked me too many questions and it made me really uncomfortable so I'm leaving." Pt informed we ask everyone the same screening questions at triage. Pt leaving anyways.

## 2017-05-25 NOTE — ED Notes (Signed)
The patient is back and states "I am sorry I learned my lesson, I am sick and I need help."

## 2017-05-25 NOTE — ED Notes (Signed)
PA at bedside.

## 2017-05-25 NOTE — ED Provider Notes (Signed)
[facility]-EMERGENCY DEPT Provider Note   CSN: [id number] Arrival date & time: 05/25/17  1957     History   Chief Complaint Chief Complaint  Patient presents with  . URI    HPI John Copeland is a 27 y.o. male.  The history is provided by the patient and medical records.  URI   Associated symptoms include cough.    26 year old male with history of anxiety, depression, diabetes, schizophrenia, homelessness, presenting to the ED for reported URI type symptoms.  Of note, this is patient's 10th ED visit so far this month, he has had 18 visits in the past 6 months.  Triage note reports URI type symptoms, however when I discussed this with patient he states "I just want to sleep".  He refuses to answer any further questions and just goes back to sleep.  Triage note reports patient took cough syrup, he denies this to me.  Past Medical History:  Diagnosis Date  . Anxiety   . Depression   . Diabetes mellitus without complication (HCC)   . Homelessness   . Schizophrenia [facility])     Patient Active Problem List   Diagnosis Date Noted  . Schizophrenia (HCC) 03/29/2017  . 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 Diphenhydramine overdose of undetermined intent 01/10/2017  . Tobacco use disorder 10/07/2016  . Diabetes (HCC) 10/07/2016  . HTN (hypertension) 10/07/2016  . Dyslipidemia 10/07/2016  . Schizoaffective disorder, bipolar type (HCC) 11/30/2015    History reviewed. No pertinent surgical history.     Home Medications    Prior to Admission medications   Medication Sig Start Date End Date Taking? Authorizing Provider  ARIPiprazole (ABILIFY) 20 MG tablet Take 20 mg by mouth daily.    [provider]  atorvastatin (LIPITOR) 10 MG tablet Take 10 mg by mouth daily.    [provider]  benztropine (COGENTIN) 1 MG tablet Take 1 mg by mouth daily.    [provider]  diphenhydrAMINE (BENADRYL) 50 MG tablet Take 50 mg by mouth at bedtime.     [provider]  divalproex (DEPAKOTE) 500 MG DR tablet Take 500 mg by mouth every 12 (twelve) hours.    [provider]  fluvoxaMINE (LUVOX) 100 MG tablet Take 100 mg by mouth at bedtime.    [provider]  Guaifenesin 1200 MG TB12 Take 1 tablet (1,200 mg total) by mouth 2 (two) times daily. 05/24/17   Lawyer, Cristal Deer, PA-C  haloperidol (HALDOL) 1 MG tablet Take 1 mg by mouth 2 (two) times daily.    [provider]  lisinopril (PRINIVIL,ZESTRIL) 10 MG tablet Take 10 mg by mouth daily.    [provider]  Multiple Vitamin (MULTIVITAMIN) tablet Take 1 tablet by mouth daily.    [provider]  promethazine-dextromethorphan (PROMETHAZINE-DM) 6.25-15 MG/5ML syrup Take 5 mLs by mouth 4 (four) times daily as needed for cough. 05/24/17   Lawyer, Cristal Deer, PA-C  traZODone (DESYREL) 100 MG tablet Take 100 mg by mouth at bedtime.    [provider]    Family History History reviewed. No pertinent family history.  Social History Social History   Tobacco Use  . Smoking status: Current Every Day Smoker    Packs/day: 0.00    Types: Cigarettes  . Smokeless tobacco: Never Used  Substance Use Topics  . Alcohol use: No  . Drug use: No     Allergies   Patient has no known allergies.   Review of Systems Review of Systems  Respiratory:  Positive for cough.   All other systems reviewed and are negative.    Physical Exam Updated Vital Signs BP (!) 151/93 (BP Location: Left Arm)   Pulse 87   Temp 97.6 F (36.4 C) (Oral)   Resp 16   Ht 6' (1.829 m)   Wt 78 kg (172 lb)   SpO2 99%   BMI 23.33 kg/m   Physical Exam  Constitutional: He is oriented to person, place, and time. He appears well-developed and well-nourished.  Sleeping, NAD  HENT:  Head: Normocephalic and atraumatic.  Mouth/Throat: Oropharynx is clear and moist.  Eyes: Conjunctivae and EOM are normal. Pupils are equal, round, and reactive to light.  Neck:  Normal range of motion.  Cardiovascular: Normal rate, regular rhythm and normal heart sounds.  Pulmonary/Chest: Effort normal and breath sounds normal. No stridor. No respiratory distress. He has no wheezes. He has no rhonchi.  Abdominal: Soft. Bowel sounds are normal. There is no tenderness. There is no rebound.  Musculoskeletal: Normal range of motion.  Shoes and socks have been removed here-- no evidence of blisters or other sores of the feet; both feet are warm and well perfused  Neurological: He is alert and oriented to person, place, and time.  Skin: Skin is warm and dry.  Psychiatric: He has a normal mood and affect.  Nursing note and vitals reviewed.    ED Treatments / Results  Labs (all labs ordered are listed, but only abnormal results are displayed) Labs Reviewed - No data to display  EKG  EKG Interpretation None       Radiology No results found.  Procedures Procedures (including critical care time)  Medications Ordered in ED Medications - No data to display   Initial Impression / Assessment and Plan / ED Course  I have reviewed the triage vital signs and the nursing notes.  Pertinent labs & imaging results that were available during my care of the patient were reviewed by me and considered in my medical decision making (see chart for details).  27 year old male here with reported URI type symptoms.  He has been seen 10 times in the past 12 days already.  He is currently homeless as he was kicked out of his group home.  Patient denies any symptoms to me, states he just wants to sleep.  Triage note reports sores on the feet and taking cough syrup-- patient denies taking any medications.  His socks and shoes have been removed here-- no sores, wounds, or rash of the feet.  VSS.  Patient appears to be malingering due to his homelessness.  He will be discharged, can go back to homeless shelter.  Can follow-up with his ACT team and/or PCP.  Patient discharged home in  stable condition.  Final Clinical Impressions(s) / ED Diagnoses   Final diagnoses:  Malingering    ED Discharge Orders    None       Garlon HatchetSanders, Seymour Pavlak M, PA-C 05/26/17 0011    Lorre NickAllen, Anthony, MD 05/26/17 1558

## 2017-05-25 NOTE — ED Notes (Signed)
Pt leaving again and states he is going [facility]. Given a [place] sandwhich and a sprite first.

## 2017-05-25 NOTE — ED Triage Notes (Addendum)
Pt to ED via [facility] -- ambulatory, pt has multiple complaints-- nose bleed ( not bleeding at present) leg pain-- left side, teeth hurt, teeth hurt, cold symptoms x 3 weeks.  Has an ACT team in the community=-- states he has been taking his medicine as ordered-- [facility].   Pt left out of triage stating "You are making me uncomfortable" pt has hx of schizophrenia and is not sure if he is taking his medicine.

## 2017-05-25 NOTE — ED Notes (Signed)
Bed: WLPT3 Expected date:  Expected time:  Means of arrival:  Comments: 

## 2017-05-25 NOTE — ED Notes (Signed)
Called for Pt x'4 No Answer 

## 2017-05-25 NOTE — ED Notes (Addendum)
Pt states he has been feeling sick for three weeks, and c/o  fluid-filled blisters on his feet.

## 2017-05-25 NOTE — ED Triage Notes (Signed)
Pt left his group home this week and can't go back, he's been to the hospital several times for the same sx, was given a [facility]gan [facility] Pt now says that he drank a bottle of cough medicine  ACT team after hours wants to be called at discharge [phone]

## 2017-05-25 NOTE — Discharge Instructions (Signed)
You can use the medications from your prior visits. You need to follow-up with your doctor.

## 2017-05-25 NOTE — ED Notes (Signed)
Pt presents to ED for assessment from EMS for nosebleed [place] [employer] [place] where patient stopped to ask for help.  Patient c/o left leg pain at arrival.

## 2017-05-25 NOTE — ED Notes (Signed)
Bed: WLPT1 Expected date:  Expected time:  Means of arrival:  Comments: 

## 2017-05-26 NOTE — ED Notes (Signed)
ACT TEam called prior to pt.'s discharged , [facility] and told this nurse  That she is coming to pick up pt. To give him a ride to shelter. Charge Nurse made aware. Pt. In the lobby instructed to wait for the ACT Team. Verbalized understanding . Food given .

## 2017-05-28 ENCOUNTER — Encounter (HOSPITAL_COMMUNITY): Payer: Self-pay

## 2017-05-28 ENCOUNTER — Emergency Department (HOSPITAL_COMMUNITY): Payer: Medicaid Other

## 2017-05-28 ENCOUNTER — Emergency Department (HOSPITAL_COMMUNITY)
Admission: EM | Admit: 2017-05-28 | Discharge: 2017-05-28 | Disposition: A | Discharge status: Home / Self Care | Payer: Medicaid Other | Attending: Emergency Medicine | Admitting: Emergency Medicine

## 2017-05-28 ENCOUNTER — Emergency Department (HOSPITAL_COMMUNITY)
Admission: EM | Admit: 2017-05-28 | Discharge: 2017-05-29 | Disposition: A | Discharge status: Home / Self Care | Payer: Medicaid Other | Attending: Emergency Medicine | Admitting: Emergency Medicine

## 2017-05-28 DIAGNOSIS — Z5900 Homelessness unspecified: Secondary | ICD-10-CM

## 2017-05-28 DIAGNOSIS — I10 Essential (primary) hypertension: Secondary | ICD-10-CM | POA: Diagnosis not present

## 2017-05-28 DIAGNOSIS — R070 Pain in throat: Secondary | ICD-10-CM | POA: Insufficient documentation

## 2017-05-28 DIAGNOSIS — R079 Chest pain, unspecified: Secondary | ICD-10-CM | POA: Insufficient documentation

## 2017-05-28 DIAGNOSIS — R42 Dizziness and giddiness: Secondary | ICD-10-CM | POA: Insufficient documentation

## 2017-05-28 DIAGNOSIS — Z760 Encounter for issue of repeat prescription: Secondary | ICD-10-CM | POA: Diagnosis not present

## 2017-05-28 DIAGNOSIS — F2089 Other schizophrenia: Secondary | ICD-10-CM | POA: Diagnosis not present

## 2017-05-28 DIAGNOSIS — Z79899 Other long term (current) drug therapy: Secondary | ICD-10-CM | POA: Diagnosis not present

## 2017-05-28 DIAGNOSIS — J029 Acute pharyngitis, unspecified: Secondary | ICD-10-CM

## 2017-05-28 DIAGNOSIS — Z7984 Long term (current) use of oral hypoglycemic drugs: Secondary | ICD-10-CM | POA: Insufficient documentation

## 2017-05-28 DIAGNOSIS — E119 Type 2 diabetes mellitus without complications: Secondary | ICD-10-CM | POA: Diagnosis not present

## 2017-05-28 DIAGNOSIS — F17210 Nicotine dependence, cigarettes, uncomplicated: Secondary | ICD-10-CM | POA: Diagnosis not present

## 2017-05-28 DIAGNOSIS — R0602 Shortness of breath: Secondary | ICD-10-CM | POA: Diagnosis not present

## 2017-05-28 DIAGNOSIS — Z008 Encounter for other general examination: Secondary | ICD-10-CM | POA: Diagnosis not present

## 2017-05-28 LAB — CBC
HCT: 37.2 % — ABNORMAL LOW (ref 39.0–52.0)
HEMOGLOBIN: 12.3 g/dL — AB (ref 13.0–17.0)
MCH: 24.3 pg — AB (ref 26.0–34.0)
MCHC: 33.1 g/dL (ref 30.0–36.0)
MCV: 73.4 fL — ABNORMAL LOW (ref 78.0–100.0)
Platelets: 347 10*3/uL (ref 150–400)
RBC: 5.07 MIL/uL (ref 4.22–5.81)
RDW: 13.9 % (ref 11.5–15.5)
WBC: 7.8 10*3/uL (ref 4.0–10.5)

## 2017-05-28 LAB — BASIC METABOLIC PANEL
ANION GAP: 11 (ref 5–15)
BUN: 13 mg/dL (ref 6–20)
CALCIUM: 9.7 mg/dL (ref 8.9–10.3)
CO2: 24 mmol/L (ref 22–32)
CREATININE: 0.92 mg/dL (ref 0.61–1.24)
Chloride: 98 mmol/L — ABNORMAL LOW (ref 101–111)
GLUCOSE: 79 mg/dL (ref 65–99)
Potassium: 4.3 mmol/L (ref 3.5–5.1)
Sodium: 133 mmol/L — ABNORMAL LOW (ref 135–145)

## 2017-05-28 NOTE — ED Notes (Signed)
Patient refused to take his belongings with him. Patient had  Two outfits and three coats. Patient stated someone gave them to him and he didn't want them he could get more if he needed them. Patient signed a piece of paper and we had three witness sign stated he did not want his belongings this paper was sent to medical records to be scanned into his chart with a sticker on it.

## 2017-05-28 NOTE — ED Triage Notes (Signed)
Pt presents with chest pain, shortness of breath and dizziness that began at 1700 after taking 6 pills at homeless shelter today.  Pt reports that a worker there gave him his medication but does not know what he took.

## 2017-05-28 NOTE — ED Notes (Signed)
Bed: WA27 Expected date:  Expected time:  Means of arrival:  Comments: 

## 2017-05-28 NOTE — ED Triage Notes (Signed)
Pt was in the lobby and noone was aware that he left and walked to the bank and called EMS Pt complains of a sore throat but is also very manic Pt was here Saturday night after not being able to go back to his group home, pt is currently homeless

## 2017-05-28 NOTE — Progress Notes (Signed)
CSW attempted to follow up with patient for discharge planning. Per notes, patient has legal guardian: [name] [phone] [facility]. Patient seen by CSW on 05/17/17 and was kicked out of group home at that time. Legal guardian informed CSW at that time patient was able to discharge to shelter. CSW left voicemail for legal guardian to notify him of [facility] and left before receiving discharge instructions. CSW awaiting on return call.   Stacy GardnerErin Mithcell Schumpert, [facility] Emergency [facility] Clinical Social Worker [phone]

## 2017-05-28 NOTE — ED Notes (Signed)
Patient also refused VS

## 2017-05-28 NOTE — ED Notes (Signed)
Patient walked out waiting social work consult refused to take his discharge papers or sign anything on the computer.

## 2017-05-28 NOTE — ED Notes (Signed)
Pt in and out of lobby waiting area; stating he wants to leave but has to ride the bus

## 2017-05-28 NOTE — ED Notes (Signed)
Bed: WTR7 Expected date:  Expected time:  Means of arrival:  Comments: 

## 2017-05-28 NOTE — ED Provider Notes (Signed)
While awaiting social work consult, patient states he had to go and left.  Per review of records and discussion with Dr. Wilkie AyeHorton, patient had no HI, SI, or auditory visual hallucinations.  He has been seen multiple times.  There is a concern for possible secondary gain and staying in the ED overnight but then leaving in the mornings.  No apparent acute emergent psychiatric or medical pathology identified.   Shaune PollackIsaacs, Elizabethann Lackey, MD 05/28/17 [phone]

## 2017-05-28 NOTE — Care Management Note (Signed)
Case Management Note  CM consulted for homelessness and placement.  CM will defer to CSW for both issues.  No further CM needs noted at this time.

## 2017-05-28 NOTE — ED Notes (Signed)
Pt denies SI/HI/AH/VH, pt requesting resources to assist in modifying his psychiatric medications.

## 2017-05-28 NOTE — ED Provider Notes (Signed)
[facility]-EMERGENCY DEPT Provider Note   CSN: [id number] Arrival date & time: 05/28/17  0106     History   Chief Complaint Chief Complaint  Patient presents with  . Medical Clearance  . Sore Throat    HPI John Copeland is a 27 y.o. male.  HPI  This is a 27 year old male with a history of homelessness, schizophrenia, diabetes who presents with sore throat.  Patient initially reported that he called EMS for sore throat.  However, he tells me that "I need medication for my attention."  He does report some sore throat.  No other upper respiratory symptoms.  He also reports sore feet.  Denies fevers, cough, shortness of breath, abdominal pain.  Patient denies any SI, HI, delusions, hallucinations.  He denies any alcohol or drug use.  I have reviewed the patient's chart extensively.  As of January 12, he had had 10 visits to the emergency room in January.  He recently got kicked out of a group home.  He is followed by case management.  He is also followed by an act team in the community.  Past Medical History:  Diagnosis Date  . Anxiety   . Depression   . Diabetes mellitus without complication (HCC)   . Homelessness   . Schizophrenia [facility](HCC)     Patient Active Problem List   Diagnosis Date Noted  . Schizophrenia (HCC) 03/29/2017  . 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 Diphenhydramine overdose of undetermined intent 01/10/2017  . Tobacco use disorder 10/07/2016  . Diabetes (HCC) 10/07/2016  . HTN (hypertension) 10/07/2016  . Dyslipidemia 10/07/2016  . Schizoaffective disorder, bipolar type (HCC) 11/30/2015    History reviewed. No pertinent surgical history.     Home Medications    Prior to Admission medications   Medication Sig Start Date End Date Taking? Authorizing Provider  ARIPiprazole (ABILIFY) 20 MG tablet Take 20 mg by mouth daily.   Yes [provider]  atorvastatin (LIPITOR) 10 MG tablet Take 10 mg by mouth daily.   Yes [provider]  benztropine (COGENTIN) 1 MG tablet Take 1 mg by mouth daily.   Yes [provider]  diphenhydrAMINE (BENADRYL) 50 MG tablet Take 50 mg by mouth at bedtime.   Yes [provider]  divalproex (DEPAKOTE) 500 MG DR tablet Take 500 mg by mouth every 12 (twelve) hours.   Yes [provider]  fluvoxaMINE (LUVOX) 100 MG tablet Take 100 mg by mouth at bedtime.   Yes [provider]  Guaifenesin 1200 MG TB12 Take 1 tablet (1,200 mg total) by mouth 2 (two) times daily. 05/24/17  Yes Lawyer, Cristal Deerhristopher, PA-C  haloperidol (HALDOL) 1 MG tablet Take 1 mg by mouth 2 (two) times daily.   Yes [provider]  lisinopril (PRINIVIL,ZESTRIL) 10 MG tablet Take 10 mg by mouth daily.   Yes [provider]  metFORMIN (GLUCOPHAGE) 500 MG tablet Take 1,000 mg by mouth 2 (two) times daily with a meal.   Yes [provider]  Multiple Vitamin (MULTIVITAMIN) tablet Take 1 tablet by mouth daily.   Yes [provider]  traZODone (DESYREL) 100 MG tablet Take 100 mg by mouth at bedtime.   Yes [provider]  promethazine-dextromethorphan (PROMETHAZINE-DM) 6.25-15 MG/5ML syrup Take 5 mLs by mouth 4 (four) times daily as needed for cough. Patient not taking: Reported on 05/28/2017 05/24/17   Charlestine NightLawyer, Christopher, PA-C    Family History History reviewed. No pertinent family history.  Social History Social History  Tobacco Use  . Smoking status: Current Every Day Smoker    Packs/day: 0.00    Types: Cigarettes  . Smokeless tobacco: Never Used  Substance Use Topics  . Alcohol use: No  . Drug use: No     Allergies   Patient has no known allergies.   Review of Systems Review of Systems  Constitutional: Negative for fever.  HENT: Positive for sore throat.   Respiratory: Negative for shortness of breath.   Cardiovascular: Negative for chest pain.  Psychiatric/Behavioral: Negative for behavioral problems, hallucinations and  suicidal ideas.  All other systems reviewed and are negative.    Physical Exam Updated Vital Signs BP (!) 152/94   Pulse 93   Temp 98.4 F (36.9 C) (Oral)   Resp 16   SpO2 100%   Physical Exam  Constitutional: He is oriented to person, place, and time. He appears well-developed and well-nourished.  HENT:  Head: Normocephalic and atraumatic.  Mouth/Throat: Uvula is midline, oropharynx is clear and moist and mucous membranes are normal. No oropharyngeal exudate.  Neck: Neck supple.  Cardiovascular: Normal rate, regular rhythm and normal heart sounds.  No murmur heard. Pulmonary/Chest: Effort normal and breath sounds normal. No respiratory distress. He has no wheezes.  Abdominal: There is no tenderness.  Musculoskeletal: He exhibits no edema.  Lymphadenopathy:    He has no cervical adenopathy.  Neurological: He is alert and oriented to person, place, and time.  Skin: Skin is warm and dry.  No contusions or abrasions noted to the feet  Psychiatric: He has a normal mood and affect.  Slightly pressured speech but directable and attentive, no SI or HI  Nursing note and vitals reviewed.    ED Treatments / Results  Labs (all labs ordered are listed, but only abnormal results are displayed) Labs Reviewed - No data to display  EKG  EKG Interpretation None       Radiology No results found.  Procedures Procedures (including critical care time)  Medications Ordered in ED Medications - No data to display   Initial Impression / Assessment and Plan / ED Course  I have reviewed the triage vital signs and the nursing notes.  Pertinent labs & imaging results that were available during my care of the patient were reviewed by me and considered in my medical decision making (see chart for details).     She presents with sore throat.  Suspect he also does not have a place to go given recent emergency visits.  His exam is largely reassuring including a normal oropharyngeal  exam and no extensive lesions or abnormalities of the feet.  He was last seen by case management on January 11.  Given time of day, will reengage case management for disposition and placement.  I do not feel he needs TTS evaluation at this time.  He is medically clear otherwise.  Final Clinical Impressions(s) / ED Diagnoses   Final diagnoses:  Other schizophrenia [facility])  Homelessness  Sore throat    ED Discharge Orders    None       Shon Baton, MD 05/28/17 [phone]

## 2017-05-29 ENCOUNTER — Encounter (HOSPITAL_COMMUNITY): Payer: Self-pay | Admitting: Emergency Medicine

## 2017-05-29 ENCOUNTER — Other Ambulatory Visit: Payer: Self-pay

## 2017-05-29 NOTE — ED Notes (Signed)
Pt returned to lobby, stating "I don't feel safe sleeping in the lobby" INformed pt of delay

## 2017-05-29 NOTE — ED Notes (Signed)
Unable to locate pt for repeat vitals

## 2017-05-29 NOTE — ED Triage Notes (Signed)
Pt brought in by EMS  [facility] and [facility] so pt called back and told them he was cold and had fallen  Pt is c/o bilateral foot pain  States he has blisters on his feet  Pt just keeps repeating he needs help  Pt is homeless

## 2017-05-29 NOTE — ED Provider Notes (Signed)
[name] Provider Note   CSN: [id number] Arrival date & time: 05/28/17  2247     History   Chief Complaint No chief complaint on file.   HPI John Copeland is a 27 y.o. male.  The patient presents with complaint of "I need a medication refill." He pulls out prescriptions that were written but not filled when seen yesterday. The prescriptions are to treat URI symptoms of cough and congestion. He reports ongoing symptoms. No fever, vomiting, chest pain, SOB. When encouraged to fill the Rx's for symptom relief he states he needs a ride to the pharmacy. He then gets up and states he is leaving.          Past Medical History:  Diagnosis Date  . Anxiety   . Depression   . Diabetes mellitus without complication (HCC)   . Homelessness   . Schizophrenia [facility](HCC)     Patient Active Problem List   Diagnosis Date Noted  . Schizophrenia (HCC) 03/29/2017  . 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 Diphenhydramine overdose of undetermined intent 01/10/2017  . Tobacco use disorder 10/07/2016  . Diabetes (HCC) 10/07/2016  . HTN (hypertension) 10/07/2016  . Dyslipidemia 10/07/2016  . Schizoaffective disorder, bipolar type (HCC) 11/30/2015    History reviewed. No pertinent surgical history.     Home Medications    Prior to Admission medications   Medication Sig Start Date End Date Taking? Authorizing Provider  ARIPiprazole (ABILIFY) 20 MG tablet Take 20 mg by mouth daily.    [provider]  atorvastatin (LIPITOR) 10 MG tablet Take 10 mg by mouth daily.    [provider]  benztropine (COGENTIN) 1 MG tablet Take 1 mg by mouth daily.    [provider]  diphenhydrAMINE (BENADRYL) 50 MG tablet Take 50 mg by mouth at bedtime.    [provider]  divalproex (DEPAKOTE) 500 MG DR tablet Take 500 mg by mouth every 12 (twelve) hours.    [provider]  fluvoxaMINE (LUVOX) 100 MG tablet Take 100 mg by mouth at  bedtime.    [provider]  Guaifenesin 1200 MG TB12 Take 1 tablet (1,200 mg total) by mouth 2 (two) times daily. 05/24/17   Lawyer, Cristal Deerhristopher, PA-C  haloperidol (HALDOL) 1 MG tablet Take 1 mg by mouth 2 (two) times daily.    [provider]  lisinopril (PRINIVIL,ZESTRIL) 10 MG tablet Take 10 mg by mouth daily.    [provider]  metFORMIN (GLUCOPHAGE) 500 MG tablet Take 1,000 mg by mouth 2 (two) times daily with a meal.    [provider]  Multiple Vitamin (MULTIVITAMIN) tablet Take 1 tablet by mouth daily.    [provider]  promethazine-dextromethorphan (PROMETHAZINE-DM) 6.25-15 MG/5ML syrup Take 5 mLs by mouth 4 (four) times daily as needed for cough. Patient not taking: Reported on 05/28/2017 05/24/17   Charlestine NightLawyer, Christopher, PA-C  traZODone (DESYREL) 100 MG tablet Take 100 mg by mouth at bedtime.    [provider]    Family History History reviewed. No pertinent family history.  Social History Social History   Tobacco Use  . Smoking status: Current Every Day Smoker    Packs/day: 0.00    Types: Cigarettes  . Smokeless tobacco: Never Used  Substance Use Topics  . Alcohol use: No  . Drug use: No     Allergies   Haldol [haloperidol lactate]   Review of Systems Review of Systems  Constitutional: Negative for chills and fever.  HENT:  Negative.   Respiratory: Negative.   Cardiovascular: Negative.   Gastrointestinal: Negative.   Musculoskeletal: Negative.   Skin: Negative.   Neurological: Negative.      Physical Exam Updated Vital Signs BP (!) 155/79 (BP Location: Right Arm)   Pulse 91   Temp 98.4 F (36.9 C) (Oral)   Resp 20   SpO2 100%   Physical Exam  Constitutional: He is oriented to person, place, and time. He appears well-developed and well-nourished.  Neck: Normal range of motion.  Pulmonary/Chest: Effort normal.  Musculoskeletal: Normal range of motion.  Neurological: He is alert and oriented to  person, place, and time.  Skin: Skin is warm and dry.  Psychiatric: He has a normal mood and affect.     ED Treatments / Results  Labs (all labs ordered are listed, but only abnormal results are displayed) Labs Reviewed  BASIC METABOLIC PANEL - Abnormal; Notable for the following components:      Result Value   Sodium 133 (*)    Chloride 98 (*)    All other components within normal limits  CBC - Abnormal; Notable for the following components:   Hemoglobin 12.3 (*)    HCT 37.2 (*)    MCV 73.4 (*)    MCH 24.3 (*)    All other components within normal limits  I-STAT TROPONIN, ED    EKG  EKG Interpretation  Date/Time:  Tuesday May 28 2017 22:50:54 EST Ventricular Rate:  98 PR Interval:  154 QRS Duration: 94 QT Interval:  342 QTC Calculation: 436 R Axis:   163 Text Interpretation:  Normal sinus rhythm Right axis deviation Incomplete right bundle branch block Possible Right ventricular hypertrophy Possible Inferior infarct , age undetermined Possible Anterior infarct , age undetermined Abnormal ECG When compared with ECG of 04/08/2017, Right axis deviation is now present Incomplete right bundle branch block is now present Q waves are now present in inferior leads - possible old inferior wall MI Confirmed by Dione Booze ([id number]) on 05/28/2017 11:18:40 PM Also confirmed by Dione Booze ([id number]), editor Barbette Hair [phone])  on 05/29/2017 8:09:41 AM       Radiology Dg Chest 2 View  Result Date: 05/28/2017 CLINICAL DATA:  27 year old male with chest pain. EXAM: CHEST  2 VIEW COMPARISON:  Chest radiograph dated 04/08/2017 FINDINGS: The heart size and mediastinal contours are within normal limits. Both lungs are clear. The visualized skeletal structures are unremarkable. IMPRESSION: No active cardiopulmonary disease. Electronically Signed   By: Elgie Collard M.D.   On: 05/28/2017 23:30    Procedures Procedures (including critical care time)  Medications Ordered in  ED Medications - No data to display   Initial Impression / Assessment and Plan / ED Course  I have reviewed the triage vital signs and the nursing notes.  Pertinent labs & imaging results that were available during my care of the patient were reviewed by me and considered in my medical decision making (see chart for details).     The patient is seen and reports he needs a way to get to the pharmacy to fill prescriptions. He then gets up and walks out of the room saying he's leaving.   He walks down the hall and turns around stating "I'm sorry, please forgive me, I'm just asking for help". I again approach the patient to ask him what kind of help he needs and he repeats "I'm sorry." He specifically denies AVH and does not seem to be responding to stimuli. He is not  aggressive. He denies SI. He denies that the help he needs is psychiatric.   He again gets up and walks out of the room stating he's leaving. He is escorted out by security.   Final Clinical Impressions(s) / ED Diagnoses   Final diagnoses:  None   1. Medication refill  ED Discharge Orders    None       Danne Harbor 05/29/17 0820    Dione Booze, MD 05/29/17 2242

## 2017-05-30 ENCOUNTER — Emergency Department (HOSPITAL_COMMUNITY)
Admission: EM | Admit: 2017-05-30 | Discharge: 2017-05-30 | Discharge status: Against Medical Advice | Payer: Medicaid Other | Source: Home / Self Care

## 2017-05-30 HISTORY — DX: Essential (primary) hypertension: I10

## 2017-05-30 NOTE — ED Notes (Signed)
Called  No response from lobby 

## 2017-05-30 NOTE — ED Notes (Signed)
Pt did not answer when called for room placement 

## 2017-05-30 NOTE — ED Notes (Signed)
Called to take to room  No response from lobby  

## 2017-05-31 ENCOUNTER — Emergency Department (HOSPITAL_COMMUNITY)
Admission: EM | Admit: 2017-05-31 | Discharge: 2017-05-31 | Disposition: A | Discharge status: Home / Self Care | Payer: Medicaid Other | Attending: Emergency Medicine | Admitting: Emergency Medicine

## 2017-05-31 ENCOUNTER — Encounter (HOSPITAL_COMMUNITY): Payer: Self-pay | Admitting: *Deleted

## 2017-05-31 ENCOUNTER — Other Ambulatory Visit: Payer: Self-pay

## 2017-05-31 ENCOUNTER — Emergency Department (HOSPITAL_COMMUNITY): Payer: Medicaid Other

## 2017-05-31 DIAGNOSIS — S8012XA Contusion of left lower leg, initial encounter: Secondary | ICD-10-CM | POA: Insufficient documentation

## 2017-05-31 DIAGNOSIS — Z590 Homelessness: Secondary | ICD-10-CM | POA: Diagnosis not present

## 2017-05-31 DIAGNOSIS — E119 Type 2 diabetes mellitus without complications: Secondary | ICD-10-CM | POA: Insufficient documentation

## 2017-05-31 DIAGNOSIS — Y929 Unspecified place or not applicable: Secondary | ICD-10-CM | POA: Diagnosis not present

## 2017-05-31 DIAGNOSIS — Y999 Unspecified external cause status: Secondary | ICD-10-CM | POA: Diagnosis not present

## 2017-05-31 DIAGNOSIS — F17210 Nicotine dependence, cigarettes, uncomplicated: Secondary | ICD-10-CM | POA: Diagnosis not present

## 2017-05-31 DIAGNOSIS — X58XXXA Exposure to other specified factors, initial encounter: Secondary | ICD-10-CM | POA: Diagnosis not present

## 2017-05-31 DIAGNOSIS — Y939 Activity, unspecified: Secondary | ICD-10-CM | POA: Diagnosis not present

## 2017-05-31 DIAGNOSIS — Z79899 Other long term (current) drug therapy: Secondary | ICD-10-CM | POA: Diagnosis not present

## 2017-05-31 DIAGNOSIS — M79605 Pain in left leg: Secondary | ICD-10-CM

## 2017-05-31 DIAGNOSIS — M79662 Pain in left lower leg: Secondary | ICD-10-CM | POA: Diagnosis present

## 2017-05-31 DIAGNOSIS — I10 Essential (primary) hypertension: Secondary | ICD-10-CM | POA: Insufficient documentation

## 2017-05-31 MED ORDER — ACETAMINOPHEN 325 MG PO TABS: 650.0000 mg | ORAL_TABLET | Freq: Once | ORAL | Status: DC

## 2017-05-31 NOTE — Discharge Instructions (Signed)
You can take tylenol or ibuprofen for pain.   You can apply ice to the affected area.   Return to the Emergency Dept for any worsening pain, redness/swelling of the leg, fever, or any other worsening or concerning symptoms.
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
## 2017-06-01 ENCOUNTER — Emergency Department (HOSPITAL_COMMUNITY)
Admission: EM | Admit: 2017-06-01 | Discharge: 2017-06-01 | Discharge status: Against Medical Advice | Payer: Medicaid Other | Attending: Emergency Medicine | Admitting: Emergency Medicine

## 2017-06-01 ENCOUNTER — Emergency Department (HOSPITAL_COMMUNITY)
Admission: EM | Admit: 2017-06-01 | Discharge: 2017-06-02 | Disposition: A | Discharge status: Home / Self Care | Payer: Medicaid Other | Source: Home / Self Care | Attending: Emergency Medicine | Admitting: Emergency Medicine

## 2017-06-01 ENCOUNTER — Other Ambulatory Visit: Payer: Self-pay

## 2017-06-01 ENCOUNTER — Encounter (HOSPITAL_COMMUNITY): Payer: Self-pay | Admitting: *Deleted

## 2017-06-01 ENCOUNTER — Encounter (HOSPITAL_COMMUNITY): Payer: Self-pay

## 2017-06-01 DIAGNOSIS — F1910 Other psychoactive substance abuse, uncomplicated: Secondary | ICD-10-CM | POA: Diagnosis not present

## 2017-06-01 DIAGNOSIS — E119 Type 2 diabetes mellitus without complications: Secondary | ICD-10-CM | POA: Insufficient documentation

## 2017-06-01 DIAGNOSIS — F329 Major depressive disorder, single episode, unspecified: Secondary | ICD-10-CM | POA: Diagnosis present

## 2017-06-01 DIAGNOSIS — I10 Essential (primary) hypertension: Secondary | ICD-10-CM | POA: Diagnosis not present

## 2017-06-01 DIAGNOSIS — Z5320 Procedure and treatment not carried out because of patient's decision for unspecified reasons: Secondary | ICD-10-CM | POA: Insufficient documentation

## 2017-06-01 DIAGNOSIS — F17210 Nicotine dependence, cigarettes, uncomplicated: Secondary | ICD-10-CM | POA: Diagnosis not present

## 2017-06-01 DIAGNOSIS — L03116 Cellulitis of left lower limb: Secondary | ICD-10-CM

## 2017-06-01 DIAGNOSIS — Z79899 Other long term (current) drug therapy: Secondary | ICD-10-CM | POA: Insufficient documentation

## 2017-06-01 DIAGNOSIS — J3489 Other specified disorders of nose and nasal sinuses: Secondary | ICD-10-CM

## 2017-06-01 DIAGNOSIS — Z7984 Long term (current) use of oral hypoglycemic drugs: Secondary | ICD-10-CM | POA: Diagnosis not present

## 2017-06-01 DIAGNOSIS — M79602 Pain in left arm: Secondary | ICD-10-CM

## 2017-06-01 MED ORDER — DOXYCYCLINE HYCLATE 100 MG PO CAPS: 100.0000 mg | ORAL_CAPSULE | Freq: Two times a day (BID) | ORAL | 0 refills | Status: DC

## 2017-06-01 MED ORDER — FLUTICASONE PROPIONATE 50 MCG/ACT NA SUSP: 2.0000 | Freq: Every day | NASAL | 2 refills | Status: DC

## 2017-06-01 NOTE — Discharge Instructions (Signed)
1. Medications: doxycycline, flonase, usual home medications 2. Treatment: rest, drink plenty of fluids,  3. Follow Up: Please followup with your primary doctor or the ER in 2-3 days for discussion of your diagnoses and further evaluation after today's visit; if you do not have a primary care doctor use the resource guide provided to find one; Please return to the ER for

## 2017-06-01 NOTE — ED Notes (Signed)
Pt requesting to take a shower. Pt reports if able to take a shower he will cooperate and change out of his clothes. [facility].

## 2017-06-01 NOTE — ED Notes (Signed)
ED Provider at bedside. 

## 2017-06-01 NOTE — ED Notes (Signed)
Pt is unable to sit still and keeps going through clothes and changing in the bathroom. Pt is able to be redirected for a short time period but that is it. Pt is cooperative however he just has a very short attention span.

## 2017-06-01 NOTE — ED Triage Notes (Signed)
[facility] He is reporting L arm pain and R leg pain. He has Full ROM of all extremities. He also endorses a runny nose and a cold x3 weeks. He is requesting medication for his cold that will not get him high. He states that last time he was given promethazine and he got too high with it. A&Ox4. He also states that he took 3 testosterone pills today and 3 yesterday for an extra burst of energy.

## 2017-06-01 NOTE — ED Notes (Signed)
Pt in waiting room acting hysterical, hollering and screaming that "I need to see the mental health doctor." asking Jesus for forgiveness. Security with patient.

## 2017-06-01 NOTE — BH Assessment (Signed)
Assessment Note  John Copeland is an 27 y.o. male., who was brought to [facility] by EMS.  Patient initially reported somatic issues and became distress while waiting in the waiting room.   The Patient presented orientated x3, mood "nervous", affect flat, guarded, with pressured speech.  Patient denied current SI, HI, AVH.  Patient reports living at [facility] for the past 2 weeks and not taking prescribed psychotropic medications for the past 2 days.  Patient reports wanting to go back to [place] "to my home church."  Patient was guarded and refused to answer several assessment questions about needing to talk with "mental doctor" and "to go back to my home church."  Patient reports misusing cough medication in terms of consuming a bottle of Robitussin cough medication along with 16 Coricidin tablets daily.  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  Patient acknowledged having a Legal Guardian and ACTT Team.  Patient gave verbal permission for ACTT Team Member Flossie Dibble to be called.  Patient reports not wanting his Legal Guardian to be called.  Collateral information gathered from:  [name], QP for [facility] ACTT Team [phone]).  Mr. Hyacinth Meeker confirmed that Flossie Dibble is the Patient ACTT Team Case Manager and that the Patient has a chronic history of family care home placements and then wondering off.  He reports the Patient will leave at any time of the day in any weather condition.  Mr. Hyacinth Meeker reports the Patient put himself in high risk situations because of poor insight and judgement.  He also reports the Patient has a history of not being medication adherent to include refusing IV Abilify.  Mr.  Hyacinth Meeker reports the Patient has been admitted to [facility] 2x for psychosis, last admission was in 03-2017.  He reports the Patient has delusions of regiliosity and will become verbally aggressive when not medication adherent.   Mr. Hyacinth Meeker reports Patient's Legal Guardian is [facility] DSS Sharl Ma [place] [phone]).  [facility] DSS after hours [phone]) was contacted.  Social Worker Lorenda Peck confirmed that [facility] is the Legal Guardian of the Patient.  Patient meets inpatient criteria and placement will be sought.  Disposition provided to [employer] and Social Worker Lorenda Peck.            Diagnosis: Schizoaffective Disorder, bipolar type  Past Medical History:  Past Medical History:  Diagnosis Date  . Anxiety   . Depression   . Diabetes mellitus without complication (HCC)   . Homelessness   . Hypertension   . Schizophrenia (HCC)     History reviewed. No pertinent surgical history.  Family History:  Family History  Problem Relation Age of Onset  . Diabetes Other     Social History:  reports that he has been smoking cigarettes.  He has been smoking about 0.00 packs per day. he has never used smokeless tobacco. He reports that he uses drugs. Drug: Marijuana. He reports that he does not drink alcohol.  Additional Social History:  Alcohol / Drug Use Pain Medications: pt denies abuse - see ptamed list Prescriptions: pt denies abuse - see pta meds list Over the Counter: pt denies abuse - see pta meds list History of alcohol / drug use?: No history of alcohol / drug abuse Longest period of sobriety (when/how long): NA  CIWA: CIWA-Ar BP: (!) 150/90 Pulse Rate: 95 COWS:    Allergies:  Allergies  Allergen Reactions  . Haldol [Haloperidol Lactate] Other (See Comments)  Makes him feel slow    Home Medications:  (Not in a hospital admission)  OB/GYN Status:  No LMP for male patient.  General Assessment Data [facility] Assessment: In system Is this a Tele or Face-to-Face Assessment?: Tele Assessment Is this an Initial Assessment or a Re-assessment for this encounter?: Initial Assessment Marital status: Single Maiden name: N/A Is patient  pregnant?: No Pregnancy Status: No Living Arrangements: Other (Comment)(Currently living at [facility]) Can pt return to current living arrangement?: Yes Admission Status: Voluntary Is patient capable of signing voluntary admission?: No Referral Source: Other(EMS) Insurance type: Medicaid  Medical Screening Exam [facility] Walk-in ONLY) Medical Exam completed: Yes  Crisis Care Plan Living Arrangements: Other (Comment)(Currently living at [facility]) Legal Guardian: Other:([place] DSS) Name of Psychiatrist: [facility] Name of Therapist: [facility](Alicia Clinton SawyerWilliamson [phone])  Education Status Is patient currently in school?: No Current Grade: N/A Highest grade of school patient has completed: 9 Name of school: n/a Contact person: n/a  Risk to self with the past 6 months Suicidal Ideation: No Has patient been a risk to self within the past 6 months prior to admission? : No Suicidal Intent: No Has patient had any suicidal intent within the past 6 months prior to admission? : No Is patient at risk for suicide?: No Suicidal Plan?: No Has patient had any suicidal plan within the past 6 months prior to admission? : No Access to Means: No What has been your use of drugs/alcohol within the last 12 months?: None Previous Attempts/Gestures: No How many times?: 0 Other Self Harm Risks: 0 Triggers for Past Attempts: None known Intentional Self Injurious Behavior: None Family Suicide History: No Recent stressful life event(s): Other (Comment)(Homeless) Persecutory voices/beliefs?: No Depression: No Substance abuse history and/or treatment for substance abuse?: No Suicide prevention information given to non-admitted patients: Not applicable  Risk to Others within the past 6 months Homicidal Ideation: No Does patient have any lifetime risk of violence toward others beyond the six months prior to admission? : No Thoughts of Harm to Others: No Current Homicidal Intent:  No Current Homicidal Plan: No Access to Homicidal Means: No Identified Victim: N/A History of harm to others?: No Assessment of Violence: None Noted Violent Behavior Description: N/A Does patient have access to weapons?: No Criminal Charges Pending?: No Does patient have a court date: No Is patient on probation?: No  Psychosis Hallucinations: None noted Delusions: Unspecified(Religious)  Mental Status Report Appearance/Hygiene: In scrubs Eye Contact: Fair Motor Activity: Unremarkable Speech: Pressured, Logical/coherent Level of Consciousness: Alert Mood: Anxious Affect: Flat(Guarded) Anxiety Level: Moderate Thought Processes: Coherent, Relevant Judgement: Impaired Orientation: Person, Place, Time Obsessive Compulsive Thoughts/Behaviors: None  Cognitive Functioning Concentration: Decreased Memory: Recent Intact, Remote Intact IQ: Average Insight: Poor Impulse Control: Poor Appetite: Good Weight Loss: 0 Weight Gain: 0 Sleep: No Change Total Hours of Sleep: 8 Vegetative Symptoms: None  ADLScreening [facility] Assessment Services) Patient's cognitive ability adequate to safely complete daily activities?: Yes Patient able to express need for assistance with ADLs?: Yes Independently performs ADLs?: Yes (appropriate for developmental age)  Prior Inpatient Therapy Prior Inpatient Therapy: Yes Prior Therapy Dates: 2018 - multiple admissions [facility] Reason for Treatment: psychosis  Prior Outpatient Therapy Prior Outpatient Therapy: Yes Prior Therapy Dates: currently Prior Therapy Facilty/Provider(s): [facility] Reason for Treatment: schizoaffective d/o, med management Does patient have an ACCT team?: Yes Does patient have Intensive In-House Services?  : No Does patient have [facility] services? : No  Does patient have P4CC services?: No  ADL Screening (condition at time of admission) Patient's cognitive ability adequate to safely complete  daily activities?: Yes Is the patient deaf or have difficulty hearing?: No Does the patient have difficulty seeing, even when wearing glasses/contacts?: No Does the patient have difficulty concentrating, remembering, or making decisions?: No Patient able to express need for assistance with ADLs?: Yes Does the patient have difficulty dressing or bathing?: No Independently performs ADLs?: Yes (appropriate for developmental age) Does the patient have difficulty walking or climbing stairs?: No Weakness of Legs: None Weakness of Arms/Hands: None       Abuse/Neglect Assessment (Assessment to be complete while patient is alone) Abuse/Neglect Assessment Can Be Completed: Yes Physical Abuse: Denies Verbal Abuse: Denies Sexual Abuse: Denies Exploitation of patient/patient's resources: Denies Self-Neglect: Denies Values / Beliefs Cultural Requests During Hospitalization: None Spiritual Requests During Hospitalization: Other (comment)(Patient requested to talk with Chaplain)   Advance Directives (For Healthcare) Does Patient Have a Medical Advance Directive?: No(Patient has a Armed forces operational officer Guardian) Would patient like information on creating a medical advance directive?: Yes (ED - Information included in AVS)    Additional Information 1:1 In Past 12 Months?: No CIRT Risk: No Elopement Risk: Yes Does patient have medical clearance?: Yes     Disposition:  Disposition Initial Assessment Completed for this Encounter: Yes Disposition of Patient: Inpatient treatment program Type of inpatient treatment program: Adult  On Site Evaluation by:   Reviewed with Physician:    Dey-Johnson,Melayah Skorupski 06/01/2017 2:38 PM

## 2017-06-01 NOTE — Progress Notes (Signed)
   06/01/17 1300  Clinical Encounter Type  Visited With Patient  Visit Type Spiritual support  Referral From Nurse  Consult/Referral To Chaplain  Spiritual Encounters  Spiritual Needs Prayer  Stress Factors  Patient Stress Factors Loss of control;Health changes;Family relationships  Chaplain was paged to visit the PT because he requested prayer.  The PT was slightly disoriented and kept repeating the same things over and over again, he wanted to get home, get to church and turn his life around.  The PT wanted prayer for those things as well as to have the pain in his arm and leg go away.  The PT admitted taking cough syrup to get energy and ease his pain.  Chaplain prayed with the PT and the PT said the chaplain could go after the prayer.

## 2017-06-01 NOTE — ED Notes (Signed)
TTS at bedside. 
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
## 2017-06-01 NOTE — ED Provider Notes (Cosign Needed)
[name] EMERGENCY DEPARTMENT Provider Note   CSN: [id number] Arrival date & time: 06/01/17  1133   History   Chief Complaint Chief Complaint  Patient presents with  . Leg Pain  . Arm Pain    HPI John Copeland is a 27 y.o. male who presents with request for detox. PMH significant for anxiety/depression, DM, homelessness, HTN, schizophrenia. He states that he abuses cough medicine and has done so since the age of 27. He reports drinking a bottle of Robitussin daily as well as taking cough medicine pills. When asked about his hand and leg (which was the original chief complaint) he states that this is minor and he really just wants help with detox. He states he has had detox for his cough medicine abuse in the past however he wasn't serious about it but this time he is. He denies SI/HI, AVH.  HPI  Past Medical History:  Diagnosis Date  . Anxiety   . Depression   . Diabetes mellitus without complication (HCC)   . Homelessness   . Hypertension   . Schizophrenia [facility](HCC)     Patient Active Problem List   Diagnosis Date Noted  . Schizophrenia (HCC) 03/29/2017  . 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 Diphenhydramine overdose of undetermined intent 01/10/2017  . Tobacco use disorder 10/07/2016  . Diabetes (HCC) 10/07/2016  . HTN (hypertension) 10/07/2016  . Dyslipidemia 10/07/2016  . Schizoaffective disorder, bipolar type (HCC) 11/30/2015    History reviewed. No pertinent surgical history.     Home Medications    Prior to Admission medications   Medication Sig Start Date End Date Taking? Authorizing Provider  ARIPiprazole (ABILIFY) 20 MG tablet Take 20 mg by mouth daily.    [provider]  atorvastatin (LIPITOR) 10 MG tablet Take 10 mg by mouth daily.    [provider]  benztropine (COGENTIN) 1 MG tablet Take 1 mg by mouth daily.    [provider]  diphenhydrAMINE (BENADRYL) 50 MG tablet Take 50 mg by mouth at bedtime.     [provider]  divalproex (DEPAKOTE) 500 MG DR tablet Take 500 mg by mouth every 12 (twelve) hours.    [provider]  fluvoxaMINE (LUVOX) 100 MG tablet Take 100 mg by mouth at bedtime.    [provider]  Guaifenesin 1200 MG TB12 Take 1 tablet (1,200 mg total) by mouth 2 (two) times daily. 05/24/17   Lawyer, Cristal Deerhristopher, PA-C  haloperidol (HALDOL) 1 MG tablet Take 1 mg by mouth 2 (two) times daily.    [provider]  lisinopril (PRINIVIL,ZESTRIL) 10 MG tablet Take 10 mg by mouth daily.    [provider]  metFORMIN (GLUCOPHAGE) 500 MG tablet Take 1,000 mg by mouth 2 (two) times daily with a meal.    [provider]  Multiple Vitamin (MULTIVITAMIN) tablet Take 1 tablet by mouth daily.    [provider]  promethazine-dextromethorphan (PROMETHAZINE-DM) 6.25-15 MG/5ML syrup Take 5 mLs by mouth 4 (four) times daily as needed for cough. Patient not taking: Reported on 05/28/2017 05/24/17   Charlestine NightLawyer, Christopher, PA-C  traZODone (DESYREL) 100 MG tablet Take 100 mg by mouth at bedtime.    [provider]    Family History Family History  Problem Relation Age of Onset  . Diabetes Other     Social History Social History   Tobacco Use  . Smoking status: Current Every Day Smoker    Packs/day: 0.00    Types: Cigarettes  . Smokeless tobacco:  Never Used  Substance Use Topics  . Alcohol use: No  . Drug use: Yes    Types: Marijuana    Comment: denies drug use     Allergies   Haldol [haloperidol lactate]   Review of Systems Review of Systems  Constitutional: Negative for fever and unexpected weight change.  Respiratory: Negative for shortness of breath.   Cardiovascular: Negative for chest pain.  Gastrointestinal: Negative for abdominal pain.  Musculoskeletal: Positive for arthralgias. Negative for gait problem.  Neurological: Negative for headaches.  Psychiatric/Behavioral: Negative for behavioral problems,  self-injury and suicidal ideas. The patient is nervous/anxious and is hyperactive.   All other systems reviewed and are negative.    Physical Exam Updated Vital Signs BP (!) 150/90 (BP Location: Right Arm)   Pulse 95   Temp 97.9 F (36.6 C) (Oral)   Resp 16   SpO2 100%   Physical Exam  Constitutional: He is oriented to person, place, and time. He appears well-developed and well-nourished. No distress.  HENT:  Head: Normocephalic and atraumatic.  Eyes: Conjunctivae are normal. Pupils are equal, round, and reactive to light. Right eye exhibits no discharge. Left eye exhibits no discharge. No scleral icterus.  Neck: Normal range of motion.  Cardiovascular: Normal rate and regular rhythm. Exam reveals no gallop and no friction rub.  No murmur heard. Pulmonary/Chest: Effort normal and breath sounds normal. No stridor. No respiratory distress. He has no wheezes. He has no rales. He exhibits no tenderness.  Abdominal: He exhibits no distension.  Neurological: He is alert and oriented to person, place, and time.  Skin: Skin is warm and dry.  Psychiatric: His speech is normal. His mood appears anxious. His affect is labile. He is agitated. He is not aggressive, not actively hallucinating and not combative. Thought content is paranoid. Thought content is not delusional. Cognition and memory are normal. He expresses impulsivity. He expresses no homicidal and no suicidal ideation. He expresses no suicidal plans and no homicidal plans. He is attentive.  Nursing note and vitals reviewed.    ED Treatments / Results  Labs (all labs ordered are listed, but only abnormal results are displayed) Labs Reviewed  COMPREHENSIVE METABOLIC PANEL  ETHANOL  RAPID URINE DRUG SCREEN, HOSP PERFORMED  CBC WITH DIFFERENTIAL/PLATELET  ACETAMINOPHEN LEVEL  SALICYLATE LEVEL  VALPROIC ACID LEVEL    EKG  EKG Interpretation None       Radiology Dg Tibia/fibula Left  Result Date: 05/31/2017 CLINICAL  DATA:  Leg pain swelling EXAM: LEFT TIBIA AND FIBULA - 2 VIEW COMPARISON:  None. FINDINGS: There is no evidence of fracture or other focal bone lesions. Soft tissue edema is present. No radiopaque foreign body. IMPRESSION: No acute osseous abnormality Electronically Signed   By: [name] Pang M.D.   On: 05/31/2017 22:08    Procedures Procedures (including critical care time)  Medications Ordered in ED Medications - No data to display   Initial Impression / Assessment and Plan / ED Course  I have reviewed the triage vital signs and the nursing notes.  Pertinent labs & imaging results that were available during my care of the patient were reviewed by me and considered in my medical decision making (see chart for details).  27 year old male presents with request for detox and request to see psychiatry. He has been off of his medicines and living in a shelter. He is hypertensive but otherwise vitals are normal. Exam is remarkable for labile and anxious mood. He denies SI/HI, AVH. There is  some concern for acute psychosis as the patient is yelling at times and acting erratic. Will obtain medical clearance labs.  1:02 PM Attempted twice to contact the patient's guardian [name] Gash. Both times the call went to VM.  1:25 PM The patient became acutely agitated after medical clearance labs were attempted to be collected and then expressed plans that he was leaving. He was encouraged to stay by nursing staff. He continued to deny SI/HI, AVH. I also spoke with the patient who was upset that he may be IVD'd. At this time, do not feel the patient can be IVC'd since he does not seem to be a danger to himself or others. Discussed with Dr. Ranae Palms. He was but allowed to leave.   Final Clinical Impressions(s) / ED Diagnoses   Final diagnoses:  Substance abuse [facility])    ED Discharge Orders    None       Bethel Born, PA-C 06/01/17 1530

## 2017-06-01 NOTE — ED Notes (Signed)
Pt denies SI/HI however was overheard singing "I don't want to hurt you, or you, or you, I don't want to hurt me". Pt seen kneeling and praying by the side of the stretcher and continues to repeat "I need help. I need a mental doctor."
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
## 2017-06-01 NOTE — [facility] Counselor (Signed)
1317:  Nurse Manson PasseyBrown reports Patient is currently distressed and talking with Chaplain.  Unable to assess, will attempt again at 1330.

## 2017-06-01 NOTE — ED Notes (Signed)
Chaplain at the bedside. Pt appears very anxious, states "I want to go home to [place]. I just haven't been to church in a while and I need to get to church." When explaining plan of care to pt, pt became very upset and repeated "I'm not trying to hurt myself. I'm not gonna hurt myself. I just take too much cough syrup sometimes. I don't want to go to no mental health place." This RN explained that pt was not at this time going to [facility]. Pt continued to become tearful and attempted to leave stating "I don't want to hurt myself. Just let me leave." EDP notified, no new orders or decision for IVC. This RN used therapeutic communication to encourage pt back to room. Pt refusing to change out or have his items inventoried. Pt denies having any weapons on him. Pt reports all his belongings consist of are toiletries and a bible.

## 2017-06-01 NOTE — ED Notes (Signed)
Pt attempting to leave. Pt denies SI/HI or auditory or visual hallucinations. Pt initially adamant about getting shower and this RN and PA reaching ACT team. After pt complied with bloodwork and verbalized plans to stay, pt then changed his mind and send "Can I go home now? I'm not IVC am I? Can I leave? I want to leave and go to [facility]." [place], [place] notified of pt plan to leave. Per Tresa EndoKelly, [place], pt appropriate to leave at this time. Pt given bus pass and walked out by this RN. Pt with steady gait to lobby, all belongings with pt. Pt refused last VS or to sign pad. Security aware pt is not IVC and is able to leave.

## 2017-06-02 ENCOUNTER — Other Ambulatory Visit: Payer: Self-pay

## 2017-06-02 ENCOUNTER — Emergency Department (HOSPITAL_COMMUNITY)
Admission: EM | Admit: 2017-06-02 | Discharge: 2017-06-03 | Disposition: A | Discharge status: Home / Self Care | Payer: Medicaid Other | Attending: Emergency Medicine | Admitting: Emergency Medicine

## 2017-06-02 DIAGNOSIS — Z79899 Other long term (current) drug therapy: Secondary | ICD-10-CM | POA: Insufficient documentation

## 2017-06-02 DIAGNOSIS — E119 Type 2 diabetes mellitus without complications: Secondary | ICD-10-CM | POA: Insufficient documentation

## 2017-06-02 DIAGNOSIS — F250 Schizoaffective disorder, bipolar type: Secondary | ICD-10-CM | POA: Diagnosis not present

## 2017-06-02 DIAGNOSIS — T3691XA Poisoning by unspecified systemic antibiotic, accidental (unintentional), initial encounter: Secondary | ICD-10-CM | POA: Diagnosis not present

## 2017-06-02 DIAGNOSIS — F209 Schizophrenia, unspecified: Secondary | ICD-10-CM | POA: Insufficient documentation

## 2017-06-02 DIAGNOSIS — I10 Essential (primary) hypertension: Secondary | ICD-10-CM | POA: Insufficient documentation

## 2017-06-02 DIAGNOSIS — F17210 Nicotine dependence, cigarettes, uncomplicated: Secondary | ICD-10-CM | POA: Diagnosis not present

## 2017-06-02 DIAGNOSIS — T50901A Poisoning by unspecified drugs, medicaments and biological substances, accidental (unintentional), initial encounter: Secondary | ICD-10-CM | POA: Insufficient documentation

## 2017-06-02 DIAGNOSIS — Z590 Homelessness: Secondary | ICD-10-CM | POA: Insufficient documentation

## 2017-06-02 LAB — CBC WITH DIFFERENTIAL/PLATELET
BASOS ABS: 0 10*3/uL (ref 0.0–0.1)
Basophils Relative: 0 %
Eosinophils Absolute: 0.1 10*3/uL (ref 0.0–0.7)
Eosinophils Relative: 1 %
HCT: 35.9 % — ABNORMAL LOW (ref 39.0–52.0)
Hemoglobin: 11.7 g/dL — ABNORMAL LOW (ref 13.0–17.0)
LYMPHS ABS: 2 10*3/uL (ref 0.7–4.0)
Lymphocytes Relative: 23 %
MCH: 24.1 pg — ABNORMAL LOW (ref 26.0–34.0)
MCHC: 32.6 g/dL (ref 30.0–36.0)
MCV: 73.9 fL — ABNORMAL LOW (ref 78.0–100.0)
MONO ABS: 0.4 10*3/uL (ref 0.1–1.0)
Monocytes Relative: 5 %
Neutro Abs: 6.1 10*3/uL (ref 1.7–7.7)
Neutrophils Relative %: 71 %
PLATELETS: 349 10*3/uL (ref 150–400)
RBC: 4.86 MIL/uL (ref 4.22–5.81)
RDW: 14.1 % (ref 11.5–15.5)
WBC: 8.6 10*3/uL (ref 4.0–10.5)

## 2017-06-02 LAB — COMPREHENSIVE METABOLIC PANEL
ALT: 35 U/L (ref 17–63)
AST: 36 U/L (ref 15–41)
Albumin: 3.9 g/dL (ref 3.5–5.0)
Alkaline Phosphatase: 53 U/L (ref 38–126)
Anion gap: 8 (ref 5–15)
BILIRUBIN TOTAL: 0.5 mg/dL (ref 0.3–1.2)
BUN: 11 mg/dL (ref 6–20)
CALCIUM: 9.4 mg/dL (ref 8.9–10.3)
CO2: 26 mmol/L (ref 22–32)
Chloride: 100 mmol/L — ABNORMAL LOW (ref 101–111)
Creatinine, Ser: 0.8 mg/dL (ref 0.61–1.24)
GFR calc Af Amer: >60 mL/min (ref >60)
Glucose, Bld: 124 mg/dL — ABNORMAL HIGH (ref 65–99)
Potassium: 3.7 mmol/L (ref 3.5–5.1)
Sodium: 134 mmol/L — ABNORMAL LOW (ref 135–145)
Total Protein: 7.2 g/dL (ref 6.5–8.1)

## 2017-06-02 LAB — VALPROIC ACID LEVEL: Valproic Acid Lvl: <10 ug/mL — ABNORMAL LOW (ref 50.0–100.0)

## 2017-06-02 LAB — RAPID URINE DRUG SCREEN, HOSP PERFORMED
Amphetamines: NOT DETECTED
BENZODIAZEPINES: NOT DETECTED
Barbiturates: NOT DETECTED
COCAINE: NOT DETECTED
Opiates: NOT DETECTED
Tetrahydrocannabinol: NOT DETECTED

## 2017-06-02 LAB — ETHANOL

## 2017-06-02 LAB — SALICYLATE LEVEL: Salicylate Lvl: <7 mg/dL (ref 2.8–30.0)

## 2017-06-02 LAB — ACETAMINOPHEN LEVEL

## 2017-06-02 MED ORDER — ARIPIPRAZOLE 10 MG PO TABS
20.0000 mg | ORAL_TABLET | Freq: Every day | ORAL | Status: DC
  Administered 2017-06-02 – 2017-06-03 (×2): 20 mg via ORAL
  Filled 2017-06-02 (×2): qty 2

## 2017-06-02 MED ORDER — SODIUM CHLORIDE 0.9 % IV BOLUS (SEPSIS)
1000.0000 mL | Freq: Once | INTRAVENOUS | Status: AC
  Administered 2017-06-02: 1000 mL via INTRAVENOUS

## 2017-06-02 MED ORDER — DOXYCYCLINE HYCLATE 100 MG PO TABS
100.0000 mg | ORAL_TABLET | Freq: Two times a day (BID) | ORAL | Status: DC
  Administered 2017-06-02 – 2017-06-03 (×2): 100 mg via ORAL
  Filled 2017-06-02 (×3): qty 1

## 2017-06-02 MED ORDER — LISINOPRIL 10 MG PO TABS
10.0000 mg | ORAL_TABLET | Freq: Every day | ORAL | Status: DC
  Administered 2017-06-02 – 2017-06-03 (×2): 10 mg via ORAL
  Filled 2017-06-02 (×2): qty 1

## 2017-06-02 MED ORDER — FLUVOXAMINE MALEATE 50 MG PO TABS
100.0000 mg | ORAL_TABLET | Freq: Every day | ORAL | Status: DC
  Administered 2017-06-02: 100 mg via ORAL
  Filled 2017-06-02: qty 2

## 2017-06-02 MED ORDER — DIVALPROEX SODIUM 500 MG PO DR TAB
500.0000 mg | DELAYED_RELEASE_TABLET | Freq: Two times a day (BID) | ORAL | Status: DC
  Administered 2017-06-02 – 2017-06-03 (×2): 500 mg via ORAL
  Filled 2017-06-02 (×2): qty 1

## 2017-06-02 MED ORDER — METFORMIN HCL 500 MG PO TABS
1000.0000 mg | ORAL_TABLET | Freq: Two times a day (BID) | ORAL | Status: DC
  Administered 2017-06-03: 1000 mg via ORAL
  Filled 2017-06-02: qty 2

## 2017-06-02 MED ORDER — ATORVASTATIN CALCIUM 10 MG PO TABS
10.0000 mg | ORAL_TABLET | Freq: Every day | ORAL | Status: DC
  Administered 2017-06-02 – 2017-06-03 (×2): 10 mg via ORAL
  Filled 2017-06-02 (×2): qty 1

## 2017-06-02 MED ORDER — TRAZODONE HCL 100 MG PO TABS
100.0000 mg | ORAL_TABLET | Freq: Every day | ORAL | Status: DC
  Administered 2017-06-02: 100 mg via ORAL
  Filled 2017-06-02: qty 1

## 2017-06-02 MED ORDER — ACETAMINOPHEN 325 MG PO TABS
650.0000 mg | ORAL_TABLET | Freq: Four times a day (QID) | ORAL | Status: DC | PRN
  Administered 2017-06-02: 650 mg via ORAL
  Filled 2017-06-02: qty 2

## 2017-06-02 MED ORDER — BENZTROPINE MESYLATE 1 MG PO TABS
1.0000 mg | ORAL_TABLET | Freq: Every day | ORAL | Status: DC
  Administered 2017-06-02 – 2017-06-03 (×2): 1 mg via ORAL
  Filled 2017-06-02 (×2): qty 1

## 2017-06-02 NOTE — ED Notes (Signed)
Dr. Adriana Simasook informed of physical assessment and reports her will come to see soon.

## 2017-06-02 NOTE — ED Notes (Signed)
Bed: WA29 Expected date:  Expected time:  Means of arrival:  Comments: EMS
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
## 2017-06-02 NOTE — ED Notes (Signed)
[facility] Expected date:  Expected time:  Means of arrival:  Comments: Ayoub

## 2017-06-02 NOTE — BH Assessment (Signed)
[facility] Assessment Progress Note  Case discussed with Nira ConnJason Berry, NP who recommends inpt treatment. TTS to seek placement. EDP Maczis, Elmer SowMichael M, PA-C and pt's nurse Consuella LoseElaine, RN advised of disposition.   Princess BruinsAquicha Hjalmar Ballengee, MSW, LCSW Therapeutic Triage Specialist  [phone]

## 2017-06-02 NOTE — ED Provider Notes (Signed)
[facility]-EMERGENCY DEPT Provider Note   CSN: [id number] Arrival date & time: 06/02/17  1640     History   Chief Complaint Chief Complaint  Patient presents with  . Psychiatric Evaluation    HPI John Copeland is a 27 y.o. male with a history of schizophrenia, depression, anxiety, non-insulin-dependent diabetes, hypertension, homelessness who presents to the emergency department today for overdose and psychiatric evaluation.  Patient was seen here yesterday for URI symptoms over the last 3 weeks.  He was discharged home on 100 mg of doxycycline twice daily.  He notes that at approximately 9 AM this morning he took all 20 of the doxycycline in an attempt to "get healed in one day".  He denies this being an overdose with intention to harm himself.  He denies any SI or HI.  He does note that he did have 5 MLS of promethazine and dextromethorphan at the same time this morning as well as his home medications.  He denies any drug use, alcohol use.  The patient has been asymptomatic since the event.  He denies any chest pain, shortness breath, palpitations, abdominal pain, nausea/vomiting/diarrhea.  Patient does note that he is been hearing and seeing things for the last several weeks.  He feels that "people do things to me at night when I sleep".  He would not specify further on this.  Patient was reported to go to different churches today "seeking the [place] of [place] with all my heart".  Patient continually interrupts history in order to attempt to pray.  HPI  Past Medical History:  Diagnosis Date  . Anxiety   . Depression   . Diabetes mellitus without complication (HCC)   . Homelessness   . Hypertension   . Schizophrenia [facility])     Patient Active Problem List   Diagnosis Date Noted  . Schizophrenia (HCC) 03/29/2017  . 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 Diphenhydramine overdose of undetermined intent 01/10/2017  . Tobacco use disorder 10/07/2016  . Diabetes (HCC)  10/07/2016  . HTN (hypertension) 10/07/2016  . Dyslipidemia 10/07/2016  . Schizoaffective disorder, bipolar type (HCC) 11/30/2015    No past surgical history on file.     Home Medications    Prior to Admission medications   Medication Sig Start Date End Date Taking? Authorizing Provider  ARIPiprazole (ABILIFY) 20 MG tablet Take 20 mg by mouth daily.    [provider]  atorvastatin (LIPITOR) 10 MG tablet Take 10 mg by mouth daily.    [provider]  benztropine (COGENTIN) 1 MG tablet Take 1 mg by mouth daily.    [provider]  diphenhydrAMINE (BENADRYL) 50 MG tablet Take 50 mg by mouth at bedtime.    [provider]  divalproex (DEPAKOTE) 500 MG DR tablet Take 500 mg by mouth every 12 (twelve) hours.    [provider]  doxycycline (VIBRAMYCIN) 100 MG capsule Take 1 capsule (100 mg total) by mouth 2 (two) times daily. 06/01/17   Muthersbaugh, Dahlia Client, PA-C  fluticasone (FLONASE) 50 MCG/ACT nasal spray Place 2 sprays into both nostrils daily. 06/01/17   Muthersbaugh, Dahlia Client, PA-C  fluvoxaMINE (LUVOX) 100 MG tablet Take 100 mg by mouth at bedtime.    [provider]  Guaifenesin 1200 MG TB12 Take 1 tablet (1,200 mg total) by mouth 2 (two) times daily. 05/24/17   Lawyer, Cristal Deer, PA-C  haloperidol (HALDOL) 1 MG tablet Take 1 mg by mouth 2 (two) times daily.    [provider]  lisinopril (  PRINIVIL,ZESTRIL) 10 MG tablet Take 10 mg by mouth daily.    [provider]  metFORMIN (GLUCOPHAGE) 500 MG tablet Take 1,000 mg by mouth 2 (two) times daily with a meal.    [provider]  Multiple Vitamin (MULTIVITAMIN) tablet Take 1 tablet by mouth daily.    [provider]  promethazine-dextromethorphan (PROMETHAZINE-DM) 6.25-15 MG/5ML syrup Take 5 mLs by mouth 4 (four) times daily as needed for cough. Patient not taking: Reported on 05/28/2017 05/24/17   Charlestine Night, PA-C  traZODone (DESYREL) 100 MG  tablet Take 100 mg by mouth at bedtime.    [provider]    Family History Family History  Problem Relation Age of Onset  . Diabetes Other     Social History Social History   Tobacco Use  . Smoking status: Current Every Day Smoker    Packs/day: 0.00    Types: Cigarettes  . Smokeless tobacco: Never Used  Substance Use Topics  . Alcohol use: No  . Drug use: Yes    Types: Marijuana    Comment: denies drug use     Allergies   Haldol [haloperidol lactate]   Review of Systems Review of Systems  All other systems reviewed and are negative.    Physical Exam Updated Vital Signs BP (!) 149/86 (BP Location: Right Arm)   Pulse 67   Temp 97.7 F (36.5 C)   Resp 20   Ht 6' (1.829 m)   Wt 78.5 kg (173 lb)   SpO2 100%   BMI 23.46 kg/m   Physical Exam  Constitutional: He appears well-developed and well-nourished.  HENT:  Head: Normocephalic and atraumatic.  Right Ear: External ear normal.  Left Ear: External ear normal.  Nose: Nose normal.  Mouth/Throat: Uvula is midline, oropharynx is clear and moist and mucous membranes are normal. No tonsillar exudate.  Eyes: Pupils are equal, round, and reactive to light. Right eye exhibits no discharge. Left eye exhibits no discharge. No scleral icterus.  Neck: Trachea normal. Neck supple. No spinous process tenderness present. No neck rigidity. Normal range of motion present.  Cardiovascular: Normal rate, regular rhythm and intact distal pulses.  No murmur heard. Pulses:      Radial pulses are 2+ on the right side, and 2+ on the left side.       Dorsalis pedis pulses are 2+ on the right side, and 2+ on the left side.       Posterior tibial pulses are 2+ on the right side, and 2+ on the left side.  No lower extremity swelling or edema. Calves symmetric in size bilaterally.  Pulmonary/Chest: Effort normal and breath sounds normal. He exhibits no tenderness.  Abdominal: Soft. Bowel sounds are normal. There is no  tenderness. There is no rigidity, no rebound, no guarding and no CVA tenderness.  Musculoskeletal: He exhibits no edema.  Lymphadenopathy:    He has no cervical adenopathy.  Neurological: He is alert.  Skin: Skin is warm and dry. No rash noted. He is not diaphoretic.  Psychiatric: He has a normal mood and affect.  Nursing note and vitals reviewed.    ED Treatments / Results  Labs (all labs ordered are listed, but only abnormal results are displayed) Labs Reviewed  VALPROIC ACID LEVEL - Abnormal; Notable for the following components:      Result Value   Valproic Acid Lvl <10 (*)    All other components within normal limits  ACETAMINOPHEN LEVEL - Abnormal; Notable for the following components:  Acetaminophen (Tylenol), Serum <10 (*)    All other components within normal limits  COMPREHENSIVE METABOLIC PANEL - Abnormal; Notable for the following components:   Sodium 134 (*)    Chloride 100 (*)    Glucose, Bld 124 (*)    All other components within normal limits  CBC WITH DIFFERENTIAL/PLATELET - Abnormal; Notable for the following components:   Hemoglobin 11.7 (*)    HCT 35.9 (*)    MCV 73.9 (*)    MCH 24.1 (*)    All other components within normal limits  SALICYLATE LEVEL  RAPID URINE DRUG SCREEN, HOSP PERFORMED  ETHANOL    EKG  EKG Interpretation  Date/Time:  Sunday June 02 2017 18:12:13 EST Ventricular Rate:  98 PR Interval:  160 QRS Duration: 100 QT Interval:  364 QTC Calculation: 464 R Axis:   95 Text Interpretation:  Normal sinus rhythm Rightward axis Inferior infarct , age undetermined Abnormal ECG Confirmed by Cook, Brian ([id number]) on 06/02/2017 10:19:21 PM       Radiology Dg Tibia/fibula Left  Result Date: 05/31/2017 CLINICAL DATA:  Leg pain swelling EXAM: LEFT TIBIA AND FIBULA - 2 VIEW COMPARISON:  None. FINDINGS: There is no evidence of fracture or other focal bone lesions. Soft tissue edema is present. No radiopaque foreign body. IMPRESSION: No acute  osseous abnormality Electronically Signed   By: Kim  Fujinaga M.D.   On: 05/31/2017 22:08    Procedures Procedures (including critical care time)  Medications Ordered in ED Medications  ARIPiprazole (ABILIFY) tablet 20 mg (20 mg Oral Given 06/02/17 2102)  benztropine (COGENTIN) tablet 1 mg (1 mg Oral Given 06/02/17 2102)  atorvastatin (LIPITOR) tablet 10 mg (10 mg Oral Given 06/02/17 2101)  divalproex (DEPAKOTE) DR tablet 500 mg (500 mg Oral Given 06/02/17 2101)  fluvoxaMINE (LUVOX) tablet 100 mg (100 mg Oral Given 06/02/17 2102)  metFORMIN (GLUCOPHAGE) tablet 1,000 mg (not administered)  lisinopril (PRINIVIL,ZESTRIL) tablet 10 mg (10 mg Oral Given 06/02/17 2103)  traZODone (DESYREL) tablet 100 mg (100 mg Oral Given 06/02/17 2102)  doxycycline (VIBRA-TABS) tablet 100 mg (100 mg Oral Given 06/02/17 2220)  acetaminophen (TYLENOL) tablet 650 mg (650 mg Oral Given 06/02/17 2220)  sodium chloride 0.9 % bolus 1,000 mL (0 mLs Intravenous Stopped 06/02/17 1857)     Initial Impression / Assessment and Plan / ED Course  I have reviewed the triage vital signs and the nursing notes.  Pertinent labs & imaging results that were available during my care of the patient were reviewed by me and considered in my medical decision making (see chart for details).     26  y.o. male with a history of schizophrenia, depression, anxiety, non-insulin-dependent diabetes, hypertension, homelessness who presents to the emergency department today for overdose and psychiatric evaluation.  Patient was seen here yesterday for URI symptoms over the last 3 weeks.  He was discharged home on 100 mg of doxycycline twice daily.  He notes that at approximately 9 AM this morning he took all 20 of the doxycycline in an attempt to "get healed in one day".  He denies this being an overdose with intention to harm himself.  He denies any SI or HI.  He does note that he did have 5 mls of promethazine and dextromethorphan at the same time this  morning as well as his home medications.  He denies any drug use or alcohol use.  The patient has been asymptomatic since the event.  He denies any chest pain, shortness breath, palpitations, abdominal pain,  nausea/vomiting/diarrhea.  Patient does note that he is been hearing and seeing things for the last several weeks.  He feels that "people do things to me at night when I sleep".  He would not specify further on this.  Patient was reported to go to different churches today "seeking the [place] of [place] with all my heart".  Patient continually interrupts history in order to attempt to pray.  On presentation the patient was tachycardic with elevated blood pressure.  Exam was reassuring as above.  Spoke with Patty and poison control who advised EKG, Tylenol level and to watch for GI symptoms including nausea and vomiting.  Advised giving IV fluids for tachycardia.  After IV fluids the patient's tachycardia resolved.  He is placed in psych hold with home meds reordered.  TTS was consulted.  Patient labs are reassuring.  EKG as above.  TTS recommended that the patient will require inpatient treatment. He has remained asymptomatic after several hours of observation. He is medically cleared. Patient will be signed out to provider default awaiting placement.   Final Clinical Impressions(s) / ED Diagnoses   Final diagnoses:  Accidental drug overdose, initial encounter    ED Discharge Orders    None       Princella Pellegrini 06/02/17 2257    Donnetta Hutching, MD 06/05/17 1027

## 2017-06-02 NOTE — ED Notes (Signed)
SBAR Report received from previous nurse. Pt received calm and visible on unit and asked to take a shower. Pt denies current SI/ HI, A/V H at this time but endorses  depression, anxiety, and pain 6/10 on left calf some redness, heat, and swelling presant at this time,but appears otherwise stable and free of distress. Pt reminded of camera surveillance, q 15 min rounds, and rules of the milieu.  Will continue to assess.

## 2017-06-02 NOTE — BH Assessment (Addendum)
Assessment Note  John Copeland is an 27 y.o. male who presents to the ED voluntarily BIB EMS due to an overdose of "100 mg of doxycycline". Pt reported to the ED that he took the entire bottle of pills (20 tablets) in an attempt to "get healed in one day." Pt denies that he was attempting suicide and does not appear to have the cognitive understanding that taking all of the medication at once could have led to an unintentional OD. Pt is slow to respond when asked direct questions and often stares blindly at this writer without responding. Pt denies HI and denies AVH to this writer however he reported to the EDP that he has been "hearing and seeing things for the last several weeks." Pt has expressed paranoid thoughts and believes people are doing things to him at night when he sleeps.    Pt has 24 ED visits in the past 6 months. Pt often comes to the ED expressing medical concerns. Per chart, pt has been evaluated by TTS on 06/01/17, 05/20/17, 05/17/17, and 04/28/17 all c/o similar presenting concerns. Pt has received inpt hospitalization at multiple facilities including [facility]. Pt states he wants to go back to [place] so he can return to his church home.  Per chart, NP reports "Patient was discharged from his group home to a shelter on 1/15 with his legal guardian's consent.  He is limited at some level.  Frequently, breaks into prayer during assessment by the RN and this provider.  Today, he reports he does not feel safe at the shelter and feels people touch him during the night.  Yesterday, he was seen for an infection in the [facility] ED and given an antibiotic.  He took the whole bottle because he wanted to be "healed."  Maddon has been in the ED EVERY DAY since discharge despite having an ACT team in [place] and Armed forces operational officer Guardian in [place].  Does not have mental capacity to make safe choices, high risk to self.  Group home placement recommended as he is not able to care for himself."  TTS reached  out to the pt's legal guardian  [name] [phone]) who states the pt has attempted suicide before and he believes this was a suicide attempt although the pt denies.   Diagnosis: Schizoaffective Disorder  Past Medical History:  Past Medical History:  Diagnosis Date  . Anxiety   . Depression   . Diabetes mellitus without complication (HCC)   . Homelessness   . Hypertension   . Schizophrenia (HCC)     No past surgical history on file.  Family History:  Family History  Problem Relation Age of Onset  . Diabetes Other     Social History:  reports that he has been smoking cigarettes.  He has been smoking about 0.00 packs per day. he has never used smokeless tobacco. He reports that he uses drugs. Drug: Marijuana. He reports that he does not drink alcohol.  Additional Social History:  Alcohol / Drug Use Pain Medications: See MAR Prescriptions: See MAR Over the Counter: See MAR History of alcohol / drug use?: No history of alcohol / drug abuse  CIWA: CIWA-Ar BP: (!) 168/94 Pulse Rate: 97 COWS:    Allergies:  Allergies  Allergen Reactions  . Haldol [Haloperidol Lactate] Other (See Comments)    Makes him feel slow    Home Medications:  (Not in a hospital admission)  OB/GYN Status:  No LMP for male patient.  General Assessment Data Location of  Assessment: [facility] ED TTS Assessment: In system Is this a Tele or Face-to-Face Assessment?: Face-to-Face Is this an Initial Assessment or a Re-assessment for this encounter?: Initial Assessment Marital status: Single Is patient pregnant?: No Pregnancy Status: No Living Arrangements: Other (Comment)(homeless) Can pt return to current living arrangement?: Yes Admission Status: Voluntary Is patient capable of signing voluntary admission?: Yes Referral Source: Self/Family/Friend Insurance type: Medicaid     Crisis Care Plan Living Arrangements: Other (Comment)(homeless) Legal Guardian: Other:([name], Magdalene Riverhavis  [place] [phone]).) Name of Psychiatrist: [facility] Name of Therapist: [facility]  Education Status Is patient currently in school?: No Highest grade of school patient has completed: 8th  Risk to self with the past 6 months Suicidal Ideation: No Has patient been a risk to self within the past 6 months prior to admission? : Yes Suicidal Intent: No Has patient had any suicidal intent within the past 6 months prior to admission? : No Is patient at risk for suicide?: Yes(possible unintentional OD) Suicidal Plan?: No Has patient had any suicidal plan within the past 6 months prior to admission? : No Access to Means: No What has been your use of drugs/alcohol within the last 12 months?: denies use  Previous Attempts/Gestures: No Triggers for Past Attempts: None known Intentional Self Injurious Behavior: None Family Suicide History: No Recent stressful life event(s): Other (Comment)(homeless, I/DD) Persecutory voices/beliefs?: No Depression: No Substance abuse history and/or treatment for substance abuse?: No Suicide prevention information given to non-admitted patients: Not applicable  Risk to Others within the past 6 months Homicidal Ideation: No Does patient have any lifetime risk of violence toward others beyond the six months prior to admission? : No Thoughts of Harm to Others: No Current Homicidal Intent: No Current Homicidal Plan: No Access to Homicidal Means: No History of harm to others?: No Assessment of Violence: None Noted Does patient have access to weapons?: No Criminal Charges Pending?: No Does patient have a court date: No Is patient on probation?: No  Psychosis Hallucinations: Auditory, Visual(pt denies, EDP note states pt endorsed AVH to EDP) Delusions: Unspecified  Mental Status Report Appearance/Hygiene: Disheveled, In scrubs Eye Contact: Good Motor Activity: Freedom of movement Speech: Slow, Soft Level of Consciousness: Alert Mood: Sullen Affect:  Flat, Anxious Anxiety Level: Minimal Thought Processes: Flight of Ideas Judgement: Impaired Orientation: Person, Place, Time Obsessive Compulsive Thoughts/Behaviors: None  Cognitive Functioning Concentration: Fair Memory: Remote Intact, Recent Intact IQ: Below Average Level of Function: unknown to this Clinical research associatewriter  Insight: Poor Impulse Control: Poor Appetite: Good Sleep: No Change Total Hours of Sleep: 8 Vegetative Symptoms: None  ADLScreening [facility]) Patient's cognitive ability adequate to safely complete daily activities?: No Patient able to express need for assistance with ADLs?: Yes Independently performs ADLs?: Yes (appropriate for developmental age)  Prior Inpatient Therapy Prior Inpatient Therapy: Yes Prior Therapy Dates: 2018 - multiple admissions Prior [facility] Reason for Treatment: psychosis  Prior Outpatient Therapy Prior Outpatient Therapy: Yes Prior Therapy Dates: currently Prior Therapy Facilty/Provider(s): [facility] Reason for Treatment: schizoaffective d/o, med management Does patient have an ACCT team?: Yes Does patient have Intensive In-House Services?  : No Does patient have [facility] services? : No Does patient have P4CC services?: No  ADL Screening (condition at time of admission) Patient's cognitive ability adequate to safely complete daily activities?: No Is the patient deaf or have difficulty hearing?: No Does the patient have difficulty seeing, even when wearing glasses/contacts?: No Does the patient have difficulty concentrating, remembering, or  making decisions?: Yes Patient able to express need for assistance with ADLs?: Yes Does the patient have difficulty dressing or bathing?: No Independently performs ADLs?: Yes (appropriate for developmental age) Does the patient have difficulty walking or climbing stairs?: No Weakness of Legs: None Weakness of Arms/Hands: None  Home Assistive  Devices/Equipment Home Assistive Devices/Equipment: None    Abuse/Neglect Assessment (Assessment to be complete while patient is alone) Abuse/Neglect Assessment Can Be Completed: Unable to assess, patient is non-responsive or altered mental status(pt stated "I don't want to talk about it.")     Advance Directives (For Healthcare) Does Patient Have a Medical Advance Directive?: No Would patient like information on creating a medical advance directive?: No - Patient declined    Additional Information 1:1 In Past 12 Months?: No CIRT Risk: No Elopement Risk: Yes Does patient have medical clearance?: Yes     Disposition: Case discussed with Nira Conn, NP who recommends inpt treatment. TTS to seek placement. EDP Maczis, Elmer Sow, PA-C and pt's nurse Consuella Lose, RN advised of disposition.   Disposition Initial Assessment Completed for this Encounter: Yes Disposition of Patient: Inpatient treatment program Type of inpatient treatment program: Adult(per Nira Conn, NP)  On Site Evaluation by:   Reviewed with Physician:    Karolee Ohs 06/02/2017 8:07 PM

## 2017-06-02 NOTE — ED Triage Notes (Signed)
Pt was picked up from [facility] with c/o overdose of antibiotics. Pt denies trying to kill himself and states "I was trying to get healed in one day." pt states that he went to two different churches today because he is "seeking the [place] of God with all my heart." Pt states that "they weren't really nice to me at the second church." Pt presents very disheveled with multiple layers of clothing on (some on backwards) & profuse obsessive religiosity.

## 2017-06-02 NOTE — Progress Notes (Signed)
Patient has legal guardian, [name], John Copeland [phone]).  Fax [phone]  Princess BruinsAquicha Faithann Natal, MSW, LCSW Therapeutic Triage Specialist  [phone]

## 2017-06-02 NOTE — ED Provider Notes (Signed)
Called by RN to examine left lower extremity.  Anterior aspect of the lower third of the tibia is erythematous, slightly puffy, tender to palpation.  Impression: Cellulitis left lower extremity.  Plan: Doxycycline 100 mg twice daily for 7 days   Donnetta Hutchingook, Aydan Phoenix, MD 06/02/17 2212

## 2017-06-03 DIAGNOSIS — F250 Schizoaffective disorder, bipolar type: Secondary | ICD-10-CM

## 2017-06-03 DIAGNOSIS — T50901A Poisoning by unspecified drugs, medicaments and biological substances, accidental (unintentional), initial encounter: Secondary | ICD-10-CM | POA: Diagnosis present

## 2017-06-03 DIAGNOSIS — Z79899 Other long term (current) drug therapy: Secondary | ICD-10-CM

## 2017-06-03 DIAGNOSIS — T3691XA Poisoning by unspecified systemic antibiotic, accidental (unintentional), initial encounter: Secondary | ICD-10-CM

## 2017-06-03 DIAGNOSIS — F17210 Nicotine dependence, cigarettes, uncomplicated: Secondary | ICD-10-CM

## 2017-06-03 NOTE — BHH Suicide Risk Assessment (Signed)
Suicide Risk Assessment  Discharge Assessment   [facility]   Principal Problem: Schizoaffective disorder, bipolar type [facility](HCC) Discharge Diagnoses:  Patient Active Problem List   Diagnosis Date Noted  . Schizophrenia (HCC) [F20.9] 03/29/2017  . Polydipsia [R63.1] 01/12/2017  . Diphenhydramine overdose of undetermined intent [T45.0X4A] 01/10/2017  . Tobacco use disorder [F17.200] 10/07/2016  . Diabetes (HCC) [E11.9] 10/07/2016  . HTN (hypertension) [I10] 10/07/2016  . Dyslipidemia [E78.5] 10/07/2016  . Schizoaffective disorder, bipolar type (HCC) [F25.0] 11/30/2015    Total Time spent with patient: 45 minutes  Musculoskeletal: Strength & Muscle Tone: within normal limits Gait & Station: normal Patient leans: N/A  Psychiatric Specialty Exam: Physical Exam  Constitutional: He is oriented to person, place, and time. He appears well-developed and well-nourished.  HENT:  Head: Normocephalic.  Respiratory: Effort normal.  Musculoskeletal: Normal range of motion.  Neurological: He is alert and oriented to person, place, and time.  Psychiatric: His speech is normal. Thought content normal. His mood appears anxious. He is withdrawn. Cognition and memory are normal. He expresses impulsivity. He exhibits a depressed mood.   Review of Systems  Psychiatric/Behavioral: Positive for depression. Negative for hallucinations, memory loss, substance abuse and suicidal ideas. The patient is nervous/anxious. The patient does not have insomnia.   All other systems reviewed and are negative.  Blood pressure (!) 146/86, pulse 98, temperature 97.9 F (36.6 C), resp. rate 20, height 6' (1.829 m), weight 78.5 kg (173 lb), SpO2 100 %.Body mass index is 23.46 kg/m. General Appearance: Casual Eye Contact:  Fair Speech:  Slow Volume:  Normal Mood:  Depressed and Irritable Affect:  Congruent and Depressed Thought Process:  Coherent Orientation:  Full (Time, Place, and  Person) Thought Content:  Logical Suicidal Thoughts:  No Homicidal Thoughts:  No Memory:  Immediate;   Fair Recent;   Fair Remote;   Fair Judgement:  Impaired Insight:  Lacking Psychomotor Activity:  Decreased Concentration:  Concentration: Fair and Attention Span: Fair Recall:  Poor Fund of Knowledge:  Fair Language:  Good Akathisia:  No Handed:  Right AIMS (if indicated):    Assets:  ArchitectCommunication Skills Financial Resources/Insurance Physical Health ADL's:  Intact Cognition:  WNL   Mental Status Per Nursing Assessment::   On Admission:   Unintentional overdose  Demographic Factors:  Male, Adolescent or young adult, Low socioeconomic status and Unemployed  Loss Factors: Financial problems/change in socioeconomic status  Historical Factors: Impulsivity  Risk Reduction Factors:   Sense of responsibility to family  Continued Clinical Symptoms:  Schizophrenia:   Paranoid or undifferentiated type  Cognitive Features That Contribute To Risk:  Closed-mindedness    Suicide Risk:  Minimal: No identifiable suicidal ideation.  Patients presenting with no risk factors but with morbid ruminations; may be classified as minimal risk based on the severity of the depressive symptoms    Plan Of Care/Follow-up recommendations:  Activity:  as tolerated Diet:  Heart Healthy  Laveda AbbeLaurie Britton Camden Mazzaferro, NP 06/03/2017, 11:58 AM

## 2017-06-03 NOTE — BH Assessment (Signed)
[facility]  Per Juanetta BeetsJacqueline Norman, DO, this pt does not require psychiatric hospitalization at this time.  [facility] with [facility] [place].  Pt is also to be provided with information about supportive services for the homeless.  These resources have been included in pt's discharge instructions.  At 10:52 this writer called pt's guardian, [name] [phone]) and notified him of pt's disposition.  Pt's nurse, Aram BeechamCynthia, has been notified.  Doylene Canninghomas Chevez Sambrano, MA Triage Specialist [phone]

## 2017-06-03 NOTE — ED Notes (Signed)
Patient alert lying on bed with blanket over his head.  He spoke with MD and nurse practioner but then returned to his former position in the bed.

## 2017-06-03 NOTE — Discharge Instructions (Addendum)
For your behavioral health needs, you are advised to continue treatment with the [facility]:       [facility]      [address]      [place], [place]  [address]      [phone]      Crisis number: [phone]) [phone]   For your shelter needs, contact the following service providers:       [facility]      [address]      [place], [place] [address]      [phone]       [facility]      [address]      [place], [place] [address]      [phone]  For day shelter and other supportive services for the homeless, contact the [facility]):       [facility]      [address]      [place], [place] [address]      [phone]  For transitional housing, contact one of the following agencies.  They provide longer term housing than a shelter, but there is an application process:       [facility]      [facility]      [address]      [place], [place] [address]      [phone]

## 2017-06-03 NOTE — Consult Note (Signed)
[facility] Consult   Reason for Consult:  Overdose Referring Physician:  EDP Patient Identification: John Copeland MRN:  [id number] Principal Diagnosis: Accidental drug overdose Diagnosis:   Patient Active Problem List   Diagnosis Date Noted  . Schizophrenia ([place]) [F20.9] 03/29/2017  . Polydipsia [R63.1] 01/12/2017  . Diphenhydramine overdose of undetermined intent [T45.0X4A] 01/10/2017  . Tobacco use disorder [F17.200] 10/07/2016  . Diabetes ([place]) [E11.9] 10/07/2016  . HTN (hypertension) [I10] 10/07/2016  . Dyslipidemia [E78.5] 10/07/2016  . Schizoaffective disorder, bipolar type ([place]) [F25.0] 11/30/2015    Total Time spent with patient: 45 minutes  Subjective:   John Copeland is a 27 y.o. male patient admitted with unintentional overdose on his antibiotics.  HPI:  Pt was seen and chart reviewed with treatment team and Dr Mariea Clonts. Pt stated he took all of his antibiotic at once because he wanted to get healed in one day.  Pt denies suicidal/homicidal ideation, denies auditory/visual hallucinations and does not appear to be responding to internal stimuli. Pt answered "I don't know" to all questions about medications and whether he has a therapist or psychiatrist. Pt has a legal guardian and was recently discharged from his group home and is currently residing at [facility]. Pt appears to be intellectually limited and during his assessment covered his head with the blanket and would not answer anymore questions. Pt's UDS and BAL are negative. Pt is psychiatrically clear for discharge.   Past Psychiatric History: As above  Risk to Self: None Risk to Others: None Prior Inpatient Therapy: Prior Inpatient Therapy: Yes Prior Therapy Dates: 2018 - multiple admissions Prior Therapy Facilty/Provider(s): [facility] Reason for Treatment: psychosis Prior Outpatient Therapy: Prior Outpatient Therapy: Yes Prior Therapy Dates: currently Prior Therapy  Facilty/Provider(s): [facility] Reason for Treatment: schizoaffective d/o, med management Does patient have an ACCT team?: Yes Does patient have Intensive In-House Services?  : No Does patient have [facility] services? : No Does patient have P4CC services?: No  Past Medical History:  Past Medical History:  Diagnosis Date  . Anxiety   . Depression   . Diabetes mellitus without complication ([place])   . Homelessness   . Hypertension   . Schizophrenia ([place])    No past surgical history on file. Family History:  Family History  Problem Relation Age of Onset  . Diabetes Other    Family Psychiatric  History: Unknown Social History:  Social History   Substance and Sexual Activity  Alcohol Use No     Social History   Substance and Sexual Activity  Drug Use Yes  . Types: Marijuana   Comment: denies drug use    Social History   Socioeconomic History  . Marital status: Single    Spouse name: Not on file  . Number of children: Not on file  . Years of education: Not on file  . Highest education level: Not on file  Social Needs  . Financial resource strain: Not on file  . Food insecurity - worry: Not on file  . Food insecurity - inability: Not on file  . Transportation needs - medical: Not on file  . Transportation needs - non-medical: Not on file  Occupational History  . Not on file  Tobacco Use  . Smoking status: Current Every Day Smoker    Packs/day: 0.00    Types: Cigarettes  . Smokeless tobacco: Never Used  Substance and Sexual Activity  . Alcohol use: No  . Drug use: Yes    Types: Marijuana  Comment: denies drug use  . Sexual activity: Not on file  Other Topics Concern  . Not on file  Social History Narrative  . Not on file   Additional Social History: N/A    Allergies:   Allergies  Allergen Reactions  . Haldol [Haloperidol Lactate] Other (See Comments)    Makes him feel slow    Labs:  Results for orders placed or performed during the hospital encounter  of 06/02/17 (from the past 48 hour(s))  Valproic acid level     Status: Abnormal   Collection Time: 06/02/17  5:59 PM  Result Value Ref Range   Valproic Acid Lvl <10 (L) 50.0 - 100.0 ug/mL    Comment: RESULTS CONFIRMED BY MANUAL DILUTION  Acetaminophen level     Status: Abnormal   Collection Time: 06/02/17  5:59 PM  Result Value Ref Range   Acetaminophen (Tylenol), Serum <10 (L) 10 - 30 ug/mL    Comment:        THERAPEUTIC CONCENTRATIONS VARY SIGNIFICANTLY. A RANGE OF 10-30 ug/mL MAY BE AN EFFECTIVE CONCENTRATION FOR MANY PATIENTS. HOWEVER, SOME ARE BEST TREATED AT CONCENTRATIONS OUTSIDE THIS RANGE. ACETAMINOPHEN CONCENTRATIONS >150 ug/mL AT 4 HOURS AFTER INGESTION AND >50 ug/mL AT 12 HOURS AFTER INGESTION ARE OFTEN ASSOCIATED WITH TOXIC REACTIONS.   Salicylate level     Status: None   Collection Time: 06/02/17  5:59 PM  Result Value Ref Range   Salicylate Lvl <6.2 2.8 - 30.0 mg/dL  Comprehensive metabolic panel     Status: Abnormal   Collection Time: 06/02/17  5:59 PM  Result Value Ref Range   Sodium 134 (L) 135 - 145 mmol/L   Potassium 3.7 3.5 - 5.1 mmol/L   Chloride 100 (L) 101 - 111 mmol/L   CO2 26 22 - 32 mmol/L   Glucose, Bld 124 (H) 65 - 99 mg/dL   BUN 11 6 - 20 mg/dL   Creatinine, Ser 0.80 0.61 - 1.24 mg/dL   Calcium 9.4 8.9 - 10.3 mg/dL   Total Protein 7.2 6.5 - 8.1 g/dL   Albumin 3.9 3.5 - 5.0 g/dL   AST 36 15 - 41 U/L   ALT 35 17 - 63 U/L   Alkaline Phosphatase 53 38 - 126 U/L   Total Bilirubin 0.5 0.3 - 1.2 mg/dL   GFR calc non Af Amer >60 >60 mL/min   GFR calc Af Amer >60 >60 mL/min    Comment: (NOTE) The eGFR has been calculated using the CKD EPI equation. This calculation has not been validated in all clinical situations. eGFR's persistently <60 mL/min signify possible Chronic Kidney Disease.    Anion gap 8 5 - 15  CBC with Differential     Status: Abnormal   Collection Time: 06/02/17  5:59 PM  Result Value Ref Range   WBC 8.6 4.0 - 10.5 K/uL    RBC 4.86 4.22 - 5.81 MIL/uL   Hemoglobin 11.7 (L) 13.0 - 17.0 g/dL   HCT 35.9 (L) 39.0 - 52.0 %   MCV 73.9 (L) 78.0 - 100.0 fL   MCH 24.1 (L) 26.0 - 34.0 pg   MCHC 32.6 30.0 - 36.0 g/dL   RDW 14.1 11.5 - 15.5 %   Platelets 349 150 - 400 K/uL   Neutrophils Relative % 71 %   Lymphocytes Relative 23 %   Monocytes Relative 5 %   Eosinophils Relative 1 %   Basophils Relative 0 %   Neutro Abs 6.1 1.7 - 7.7 K/uL   Lymphs  Abs 2.0 0.7 - 4.0 K/uL   Monocytes Absolute 0.4 0.1 - 1.0 K/uL   Eosinophils Absolute 0.1 0.0 - 0.7 K/uL   Basophils Absolute 0.0 0.0 - 0.1 K/uL   Smear Review MORPHOLOGY UNREMARKABLE   Urine rapid drug screen (hosp performed)     Status: None   Collection Time: 06/02/17  5:59 PM  Result Value Ref Range   Opiates NONE DETECTED NONE DETECTED   Cocaine NONE DETECTED NONE DETECTED   Benzodiazepines NONE DETECTED NONE DETECTED   Amphetamines NONE DETECTED NONE DETECTED   Tetrahydrocannabinol NONE DETECTED NONE DETECTED   Barbiturates NONE DETECTED NONE DETECTED    Comment: (NOTE) DRUG SCREEN FOR MEDICAL PURPOSES ONLY.  IF CONFIRMATION IS NEEDED FOR ANY PURPOSE, NOTIFY LAB WITHIN 5 DAYS. LOWEST DETECTABLE LIMITS FOR URINE DRUG SCREEN Drug Class                     Cutoff (ng/mL) Amphetamine and metabolites    1000 Barbiturate and metabolites    200 Benzodiazepine                 315 Tricyclics and metabolites     300 Opiates and metabolites        300 Cocaine and metabolites        300 THC                            50   Ethanol     Status: None   Collection Time: 06/02/17  5:59 PM  Result Value Ref Range   Alcohol, Ethyl (B) <10 <10 mg/dL    Comment:        LOWEST DETECTABLE LIMIT FOR SERUM ALCOHOL IS 10 mg/dL FOR MEDICAL PURPOSES ONLY     Current Facility-Administered Medications  Medication Dose Route Frequency Provider Last Rate Last Dose  . acetaminophen (TYLENOL) tablet 650 mg  650 mg Oral Q6H PRN Nat Christen, MD   650 mg at 06/02/17 2220  .  ARIPiprazole (ABILIFY) tablet 20 mg  20 mg Oral Daily Jillyn Ledger, PA-C   20 mg at 06/03/17 1037  . atorvastatin (LIPITOR) tablet 10 mg  10 mg Oral Daily Jillyn Ledger, PA-C   10 mg at 06/03/17 1038  . benztropine (COGENTIN) tablet 1 mg  1 mg Oral Daily Jillyn Ledger, PA-C   1 mg at 06/03/17 1037  . divalproex (DEPAKOTE) DR tablet 500 mg  500 mg Oral Q12H Jillyn Ledger, PA-C   500 mg at 06/03/17 1038  . doxycycline (VIBRA-TABS) tablet 100 mg  100 mg Oral BID Nat Christen, MD   100 mg at 06/03/17 0853  . fluvoxaMINE (LUVOX) tablet 100 mg  100 mg Oral QHS Jillyn Ledger, PA-C   100 mg at 06/02/17 2102  . lisinopril (PRINIVIL,ZESTRIL) tablet 10 mg  10 mg Oral Daily Jillyn Ledger, PA-C   10 mg at 06/03/17 1038  . metFORMIN (GLUCOPHAGE) tablet 1,000 mg  1,000 mg Oral BID WC Jillyn Ledger, PA-C   1,000 mg at 06/03/17 9458  . traZODone (DESYREL) tablet 100 mg  100 mg Oral QHS Jillyn Ledger, PA-C   100 mg at 06/02/17 2102   Current Outpatient Medications  Medication Sig Dispense Refill  . ARIPiprazole (ABILIFY) 20 MG tablet Take 20 mg by mouth daily.    Marland Kitchen atorvastatin (LIPITOR) 10 MG tablet Take 10 mg by mouth daily.    . benztropine (  COGENTIN) 1 MG tablet Take 1 mg by mouth daily.    . diphenhydrAMINE (BENADRYL) 50 MG tablet Take 50 mg by mouth at bedtime.    . divalproex (DEPAKOTE) 500 MG DR tablet Take 500 mg by mouth every 12 (twelve) hours.    Marland Kitchen doxycycline (VIBRAMYCIN) 100 MG capsule Take 1 capsule (100 mg total) by mouth 2 (two) times daily. 20 capsule 0  . fluticasone (FLONASE) 50 MCG/ACT nasal spray Place 2 sprays into both nostrils daily. 9.9 g 2  . fluvoxaMINE (LUVOX) 100 MG tablet Take 100 mg by mouth at bedtime.    . Guaifenesin 1200 MG TB12 Take 1 tablet (1,200 mg total) by mouth 2 (two) times daily. 20 each 0  . haloperidol (HALDOL) 1 MG tablet Take 1 mg by mouth 2 (two) times daily.    Marland Kitchen lisinopril (PRINIVIL,ZESTRIL) 10 MG tablet Take 10 mg by mouth  daily.    . metFORMIN (GLUCOPHAGE) 500 MG tablet Take 1,000 mg by mouth 2 (two) times daily with a meal.    . Multiple Vitamin (MULTIVITAMIN) tablet Take 1 tablet by mouth daily.    . promethazine-dextromethorphan (PROMETHAZINE-DM) 6.25-15 MG/5ML syrup Take 5 mLs by mouth 4 (four) times daily as needed for cough. (Patient not taking: Reported on 05/28/2017) 120 mL 0  . traZODone (DESYREL) 100 MG tablet Take 100 mg by mouth at bedtime.      Musculoskeletal: Strength & Muscle Tone: within normal limits Gait & Station: normal Patient leans: N/A  Psychiatric Specialty Exam: Physical Exam  Nursing note and vitals reviewed. Constitutional: He is oriented to person, place, and time. He appears well-developed and well-nourished.  HENT:  Head: Normocephalic.  Neck: Normal range of motion.  Respiratory: Effort normal.  Musculoskeletal: Normal range of motion.  Neurological: He is alert and oriented to person, place, and time.  Psychiatric: His speech is normal. Thought content normal. His mood appears anxious. He is withdrawn. Cognition and memory are normal. He expresses impulsivity. He exhibits a depressed mood.    Review of Systems  Psychiatric/Behavioral: Positive for depression. Negative for hallucinations, memory loss, substance abuse and suicidal ideas. The patient is nervous/anxious. The patient does not have insomnia.   All other systems reviewed and are negative.   Blood pressure (!) 146/86, pulse 98, temperature 97.9 F (36.6 C), resp. rate 20, height 6' (1.829 m), weight 78.5 kg (173 lb), SpO2 100 %.Body mass index is 23.46 kg/m.  General Appearance: Casual  Eye Contact:  Fair  Speech:  Slow  Volume:  Normal  Mood:  Depressed and Irritable  Affect:  Congruent and Depressed  Thought Process:  Coherent  Orientation:  Full (Time, Place, and Person)  Thought Content:  Logical  Suicidal Thoughts:  No  Homicidal Thoughts:  No  Memory:  Immediate;   Fair Recent;   Fair Remote;    Fair  Judgement:  Impaired  Insight:  Lacking  Psychomotor Activity:  Decreased  Concentration:  Concentration: Fair and Attention Span: Fair  Recall:  Poor  Fund of Knowledge:  Fair  Language:  Good  Akathisia:  No  Handed:  Right  AIMS (if indicated):    N/A  Assets:  Agricultural consultant Physical Health  ADL's:  Intact  Cognition:  WNL  Sleep:    N/A     Treatment Plan Summary: Plan Schizoaffective disorder, bipolar type ([place])  Discharge Home Take all medications as prescribed Follow up with [facility]  Disposition: No evidence of imminent risk to  self or others at present.   Patient does not meet criteria for psychiatric inpatient admission. Supportive therapy provided about ongoing stressors. Discussed crisis plan, support from social network, calling 911, coming to the Emergency Department, and calling Suicide Hotline.  Ethelene Hal, NP 06/03/2017 11:54 AM   Patient seen face-to-face for psychiatric evaluation, chart reviewed and case discussed with the physician extender and developed treatment plan. Reviewed the information documented and agree with the treatment plan.  Buford Dresser, DO

## 2017-06-07 ENCOUNTER — Other Ambulatory Visit: Payer: Self-pay

## 2017-06-07 ENCOUNTER — Encounter (HOSPITAL_COMMUNITY): Payer: Self-pay

## 2017-06-07 ENCOUNTER — Emergency Department (HOSPITAL_COMMUNITY)
Admission: EM | Admit: 2017-06-07 | Discharge: 2017-06-07 | Disposition: A | Discharge status: Home / Self Care | Payer: Medicaid Other | Attending: Emergency Medicine | Admitting: Emergency Medicine

## 2017-06-07 DIAGNOSIS — F17210 Nicotine dependence, cigarettes, uncomplicated: Secondary | ICD-10-CM | POA: Diagnosis not present

## 2017-06-07 DIAGNOSIS — Z79899 Other long term (current) drug therapy: Secondary | ICD-10-CM | POA: Diagnosis not present

## 2017-06-07 DIAGNOSIS — Z7984 Long term (current) use of oral hypoglycemic drugs: Secondary | ICD-10-CM | POA: Insufficient documentation

## 2017-06-07 DIAGNOSIS — E119 Type 2 diabetes mellitus without complications: Secondary | ICD-10-CM | POA: Insufficient documentation

## 2017-06-07 DIAGNOSIS — M79605 Pain in left leg: Secondary | ICD-10-CM | POA: Diagnosis present

## 2017-06-07 DIAGNOSIS — L03116 Cellulitis of left lower limb: Secondary | ICD-10-CM | POA: Diagnosis not present

## 2017-06-07 DIAGNOSIS — I10 Essential (primary) hypertension: Secondary | ICD-10-CM | POA: Insufficient documentation

## 2017-06-07 MED ORDER — DOXYCYCLINE HYCLATE 100 MG PO CAPS: 100.0000 mg | ORAL_CAPSULE | Freq: Two times a day (BID) | ORAL | 0 refills | Status: DC

## 2017-06-07 MED FILL — DOXYCYCLINE HYCLATE 100 MG: 100 | 10 days supply | Qty: 20 | Fill #0

## 2017-06-07 NOTE — ED Triage Notes (Addendum)
Patient arrives by EMS with complaints of leg pain. [facility] was [facility] of [facility] tonight because he kept walking around" per EMS. Patient states his leg hurts. Patient has been seen multiple times for same complaint.

## 2017-06-07 NOTE — ED Provider Notes (Signed)
[facility]   CSN: [id number] Arrival date & time: 06/07/17  0406     History   Chief Complaint Chief Complaint  Patient presents with  . Leg Pain    HPI John Copeland is a 27 y.o. male.  Patient presents to the emergency department with a chief complaint of left leg pain.  He reports a history of the same.  He has been being treated for cellulitis of the left shin.  He denies any new injuries or changes in his symptoms.  He states that he was recently prescribed doxycycline and ended up taking all the pills all at once thinking that it would cause him to get better faster.  He was seen in the emergency department after this and was reportedly prescribed doxycycline again.  He states that he has not filled this prescription.  He denies fevers, chills, nausea, or vomiting.   The history is provided by the patient. No language interpreter was used.    Past Medical History:  Diagnosis Date  . Anxiety   . Depression   . Diabetes mellitus without complication (HCC)   . Homelessness   . Hypertension   . Schizophrenia [facility](HCC)     Patient Active Problem List   Diagnosis Date Noted  . Accidental drug overdose   . Schizophrenia (HCC) 03/29/2017  . 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 Diphenhydramine overdose of undetermined intent 01/10/2017  . Tobacco use disorder 10/07/2016  . Diabetes (HCC) 10/07/2016  . HTN (hypertension) 10/07/2016  . Dyslipidemia 10/07/2016  . Schizoaffective disorder, bipolar type (HCC) 11/30/2015    History reviewed. No pertinent surgical history.     Home Medications    Prior to Admission medications   Medication Sig Start Date End Date Taking? Authorizing Provider  ARIPiprazole (ABILIFY) 20 MG tablet Take 20 mg by mouth daily.    [provider]  atorvastatin (LIPITOR) 10 MG tablet Take 10 mg by mouth daily.    [provider]  benztropine (COGENTIN) 1 MG tablet Take 1 mg by mouth  daily.    [provider]  diphenhydrAMINE (BENADRYL) 50 MG tablet Take 50 mg by mouth at bedtime.    [provider]  divalproex (DEPAKOTE) 500 MG DR tablet Take 500 mg by mouth every 12 (twelve) hours.    [provider]  doxycycline (VIBRAMYCIN) 100 MG capsule Take 1 capsule (100 mg total) by mouth 2 (two) times daily. 06/01/17   Muthersbaugh, Dahlia ClientHannah, PA-C  fluticasone (FLONASE) 50 MCG/ACT nasal spray Place 2 sprays into both nostrils daily. 06/01/17   Muthersbaugh, Dahlia ClientHannah, PA-C  fluvoxaMINE (LUVOX) 100 MG tablet Take 100 mg by mouth at bedtime.    [provider]  Guaifenesin 1200 MG TB12 Take 1 tablet (1,200 mg total) by mouth 2 (two) times daily. 05/24/17   Lawyer, Cristal Deerhristopher, PA-C  haloperidol (HALDOL) 1 MG tablet Take 1 mg by mouth 2 (two) times daily.    [provider]  lisinopril (PRINIVIL,ZESTRIL) 10 MG tablet Take 10 mg by mouth daily.    [provider]  metFORMIN (GLUCOPHAGE) 500 MG tablet Take 1,000 mg by mouth 2 (two) times daily with a meal.    [provider]  Multiple Vitamin (MULTIVITAMIN) tablet Take 1 tablet by mouth daily.    [provider]  promethazine-dextromethorphan (PROMETHAZINE-DM) 6.25-15 MG/5ML syrup Take 5 mLs by mouth 4 (four) times daily as needed for cough. Patient not taking: Reported on 05/28/2017 05/24/17   Charlestine NightLawyer, Christopher, PA-C  traZODone (DESYREL) 100 MG tablet Take 100 mg by mouth at bedtime.    [provider]    Family History Family History  Problem Relation Age of Onset  . Diabetes Other     Social History Social History   Tobacco Use  . Smoking status: Current Every Day Smoker    Packs/day: 0.00    Types: Cigarettes  . Smokeless tobacco: Never Used  Substance Use Topics  . Alcohol use: No  . Drug use: Yes    Types: Marijuana    Comment: denies drug use     Allergies   Haldol [haloperidol lactate]   Review of Systems Review of Systems  All other  systems reviewed and are negative.    Physical Exam Updated Vital Signs BP (!) 153/103 (BP Location: Left Arm)   Pulse 72   Temp 98.3 F (36.8 C) (Oral)   Resp 16   SpO2 100%   Physical Exam  Constitutional: He is oriented to person, place, and time. No distress.  HENT:  Head: Normocephalic and atraumatic.  Eyes: Conjunctivae and EOM are normal. Pupils are equal, round, and reactive to light.  Neck: No tracheal deviation present.  Cardiovascular: Normal rate.  Pulmonary/Chest: Effort normal. No respiratory distress.  Abdominal: Soft.  Musculoskeletal: Normal range of motion.       Legs: Neurological: He is alert and oriented to person, place, and time.  Skin: Skin is warm and dry. He is not diaphoretic.  Left anterior shin is mildly erythematous, no abscess  Psychiatric: Judgment normal.  Nursing note and vitals reviewed.    ED Treatments / Results  Labs (all labs ordered are listed, but only abnormal results are displayed) Labs Reviewed - No data to display  EKG  EKG Interpretation None       Radiology No results found.  Procedures Procedures (including critical care time)  Medications Ordered in ED Medications - No data to display   Initial Impression / Assessment and Plan / ED Course  I have reviewed the triage vital signs and the nursing notes.  Pertinent labs & imaging results that were available during my care of the patient were reviewed by me and considered in my medical decision making (see chart for details).     Patient with mild cellulitis of left shin.  He was unable to have his prescription filled for doxycycline after his last visit.  I encouraged the patient to take the medication 1 tablet twice daily times 10 days.  He was able to report back this information.  Final Clinical Impressions(s) / ED Diagnoses   Final diagnoses:  Cellulitis of left lower extremity    ED Discharge Orders        Ordered    doxycycline (VIBRAMYCIN) 100  MG capsule  2 times daily     06/07/17 0503       Roxy Horseman, PA-C 06/07/17 1610    Pricilla Loveless, MD 06/07/17 [phone]

## 2017-06-07 NOTE — ED Notes (Signed)
Bed: [id number] Expected date:  Expected time:  Means of arrival:  Comments: EMS 27 yo male pain in left leg-[facility] last 2 days

## 2017-06-07 NOTE — ED Notes (Signed)
Patient states he is just cold and hungry, patient has erythema to anterior aspect lower left leg. Patient given warm blankets, OJ and 2 sandwiches, applesauce. Patient states he has not eaten all day.

## 2017-06-07 NOTE — Discharge Instructions (Signed)
Take the medication 1 tablet in the AM and 1 tablet in the PM.  Do NOT take all the medicine at once.  It is important to spread it out over the 10 days.

## 2017-06-09 ENCOUNTER — Emergency Department (HOSPITAL_COMMUNITY)
Admission: EM | Admit: 2017-06-09 | Discharge: 2017-06-09 | Disposition: A | Discharge status: Home / Self Care | Payer: Medicaid Other | Attending: Emergency Medicine | Admitting: Emergency Medicine

## 2017-06-09 ENCOUNTER — Encounter (HOSPITAL_COMMUNITY): Payer: Self-pay

## 2017-06-09 DIAGNOSIS — J069 Acute upper respiratory infection, unspecified: Secondary | ICD-10-CM | POA: Diagnosis not present

## 2017-06-09 DIAGNOSIS — F17210 Nicotine dependence, cigarettes, uncomplicated: Secondary | ICD-10-CM | POA: Insufficient documentation

## 2017-06-09 DIAGNOSIS — F209 Schizophrenia, unspecified: Secondary | ICD-10-CM | POA: Diagnosis not present

## 2017-06-09 DIAGNOSIS — Z7984 Long term (current) use of oral hypoglycemic drugs: Secondary | ICD-10-CM | POA: Insufficient documentation

## 2017-06-09 DIAGNOSIS — Z590 Homelessness: Secondary | ICD-10-CM | POA: Insufficient documentation

## 2017-06-09 DIAGNOSIS — I10 Essential (primary) hypertension: Secondary | ICD-10-CM | POA: Insufficient documentation

## 2017-06-09 DIAGNOSIS — E119 Type 2 diabetes mellitus without complications: Secondary | ICD-10-CM | POA: Insufficient documentation

## 2017-06-09 DIAGNOSIS — R462 Strange and inexplicable behavior: Secondary | ICD-10-CM | POA: Diagnosis present

## 2017-06-09 DIAGNOSIS — Z79899 Other long term (current) drug therapy: Secondary | ICD-10-CM | POA: Insufficient documentation

## 2017-06-09 DIAGNOSIS — Z008 Encounter for other general examination: Secondary | ICD-10-CM

## 2017-06-09 LAB — RAPID URINE DRUG SCREEN, HOSP PERFORMED
Amphetamines: NOT DETECTED
BARBITURATES: NOT DETECTED
BENZODIAZEPINES: NOT DETECTED
COCAINE: NOT DETECTED
Opiates: NOT DETECTED
Tetrahydrocannabinol: NOT DETECTED

## 2017-06-09 LAB — BASIC METABOLIC PANEL
Anion gap: 13 (ref 5–15)
BUN: 5 mg/dL — ABNORMAL LOW (ref 6–20)
CHLORIDE: 103 mmol/L (ref 101–111)
CO2: 20 mmol/L — AB (ref 22–32)
CREATININE: 0.8 mg/dL (ref 0.61–1.24)
Calcium: 9.5 mg/dL (ref 8.9–10.3)
GFR calc non Af Amer: >60 mL/min (ref >60)
Glucose, Bld: 105 mg/dL — ABNORMAL HIGH (ref 65–99)
POTASSIUM: 4.2 mmol/L (ref 3.5–5.1)
SODIUM: 136 mmol/L (ref 135–145)

## 2017-06-09 LAB — CBG MONITORING, ED: GLUCOSE-CAPILLARY: 159 mg/dL — AB (ref 65–99)

## 2017-06-09 LAB — ACETAMINOPHEN LEVEL: Acetaminophen (Tylenol), Serum: 19 ug/mL (ref 10–30)

## 2017-06-09 LAB — CBC
HEMATOCRIT: 37.3 % — AB (ref 39.0–52.0)
HEMOGLOBIN: 11.8 g/dL — AB (ref 13.0–17.0)
MCH: 23.6 pg — ABNORMAL LOW (ref 26.0–34.0)
MCHC: 31.6 g/dL (ref 30.0–36.0)
MCV: 74.6 fL — ABNORMAL LOW (ref 78.0–100.0)
Platelets: 412 10*3/uL — ABNORMAL HIGH (ref 150–400)
RBC: 5 MIL/uL (ref 4.22–5.81)
RDW: 14.3 % (ref 11.5–15.5)
WBC: 8.3 10*3/uL (ref 4.0–10.5)

## 2017-06-09 LAB — URINALYSIS, ROUTINE W REFLEX MICROSCOPIC
BILIRUBIN URINE: NEGATIVE
Glucose, UA: NEGATIVE mg/dL
Hgb urine dipstick: NEGATIVE
Ketones, ur: NEGATIVE mg/dL
Leukocytes, UA: NEGATIVE
NITRITE: NEGATIVE
PH: 5 (ref 5.0–8.0)
Protein, ur: NEGATIVE mg/dL
SPECIFIC GRAVITY, URINE: 1.01 (ref 1.005–1.030)

## 2017-06-09 LAB — SALICYLATE LEVEL

## 2017-06-09 LAB — VALPROIC ACID LEVEL

## 2017-06-09 MED ORDER — BENZONATATE 100 MG PO CAPS
100.0000 mg | ORAL_CAPSULE | Freq: Once | ORAL | Status: DC
  Filled 2017-06-09: qty 1

## 2017-06-09 MED ORDER — ACETAMINOPHEN 325 MG PO TABS
650.0000 mg | ORAL_TABLET | ORAL | Status: DC | PRN
Start: 2017-06-09 — End: 2017-06-09
  Administered 2017-06-09: 650 mg via ORAL
  Filled 2017-06-09: qty 2

## 2017-06-09 MED ORDER — LORAZEPAM 1 MG PO TABS
1.0000 mg | ORAL_TABLET | Freq: Once | ORAL | Status: AC
  Administered 2017-06-09: 1 mg via ORAL
  Filled 2017-06-09: qty 1

## 2017-06-09 MED ORDER — DM-GUAIFENESIN ER 30-600 MG PO TB12: 1.0000 | ORAL_TABLET | Freq: Every day | ORAL | 0 refills | Status: DC

## 2017-06-09 NOTE — ED Notes (Addendum)
Pt. Came outside of the room stating he was upset and embarrassed. "I don't like talking about my medical problems, I don't mean to offend anyone." Pt embarrassed about what he talked with PA about. I walked pt. Back to his room and informed him it was good that he told the provider about his medical information and that it will help for his care plan. I turned the TV on for the patient and informed the PA.

## 2017-06-09 NOTE — ED Triage Notes (Signed)
TTS done 

## 2017-06-09 NOTE — ED Notes (Signed)
Left a message with [place] ACT team per MD request to make sure patient has a safe discharge

## 2017-06-09 NOTE — Discharge Instructions (Signed)
Please follow up with your ACT team. Follow up with your primary doctor if your symptoms do not improve. Return to the ER for any new or worsening symptoms.

## 2017-06-09 NOTE — ED Notes (Signed)
Pt refused medication ordered for cough; Pt started being rude to RN and demanding RN to do "her" job and wrap his foot; Pt told RN that he was not attracted her and to get over herself; RN exited pt's room for safety; Pt came out and was rude to RN and call RN ugly; Security called -[employer]Monique,RN
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
## 2017-06-09 NOTE — ED Triage Notes (Signed)
PT refused to have blood drawn.

## 2017-06-09 NOTE — ED Triage Notes (Signed)
Patient complains of cold symptoms for several weeks. States that he is taking otc meds with no relief. [facility] for [facility]. No distress
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
## 2017-06-09 NOTE — BH Assessment (Signed)
Tele Assessment Note   Patient Name: John Copeland MRN: [id number] Referring Physician: ED PA Location of Patient: [facility] Location of Provider: Behavioral Health TTS Department  John Copeland is a 27 y.o. male who presented to [facility] on a voluntary basis with complaint of persistent cold symptoms.  Pt has presented to the ED four times in January 2019, each with similar presentation.  He was last assessed by TTS on January 20.  At that time, he presented with an apparent overdose on "100 mg of doxycycline.''  He reported that he ingested 20 tabs in an attempt to heal himself from his cold.  He denied that it was an intentional overdose/suicide attempt.  Pt was held overnight and then released by the attending ED physician.  [facility] DSS is Pt's guardian.  The specific contact is John Copeland [phone]).  Pt also has an ACT Team -- [facility] in [place] [phone]).    Pt provided some history.  Pt reported that he has a cold.  He denied suicidal ideation -- "I don't want to go to hell" -- and homicidal ideation.  "I don't want to hurt anyone."  Pt also denied self-injurious behavior and hallucination.  Per report, Pt has endorsed marijuana use, but UDS was not available at time of assessment.  Author asked several times how the hospital could help Pt.  Pt stated that he wanted cold medicine.  He also stated that he would like medicine to help with his attention.  Per history, Pt has a diagnosis of Schizoaffective Disorder, Bipolar Type for which he receives ACT Team treatment.  Pt may also have cognitive difficulties -- Pt was slow to respond to some questions, exhibited perseveration, and had difficulty answering some questions.  Pt reported that in addition to having a cold, he is homeless.  Pt said that the shelter where he was staying recently -- [facility] -- kicked him out.  ''I'm homeless.''  Pt denied depressive symptoms.  Per history, Pt has misused cold medication  before.  Author contacted [name], who stated that he has no plans to pick up Pt tonight, but he is trying to find long-term placement for Pt.  [name] expressed understanding that Pt is at the ED and may be discharged.  [name] stated that Pt receives psychiatric services through [facility], but that it is difficult for [facility] to administer medication as Pt moves frequently.  Author also contacted the [facility] Crisis Line and spoke with a representative familiar with [name] (the assigned contact, John Copeland) was not available.  [facility] reported that Pt has a history of being placed in various family care homes and then eloping/leaving when he becomes impatient.  Advised the representative that Pt stated he is homeless.  The [facility] representative disputed this, stating that she is familiar with [facility] and that Pt has not been removed from the shelter -- he prefers not to stay there because he does not like it.  The [facility] representative expressed understanding that Pt is currently at the hospital and that he may be discharged as he does not meet inpatient criteria.  During assessment, Pt presented as alert and oriented x3.  He had fair eye contact and was cooperative.  Pt was dressed in street clothes and appeared appropriately groomed.  Pt's mood was preoccupied.  Affect was somewhat anxious and preoccupied.  Pt denied depressive symptoms, including suicidal ideation.  Pt also denied homicidal ideation, hallucination, self-injurious behavior, and substance use.  Pt's  speech was soft and slow.  Pt repeated himself several times, insisting that he wanted cold medicine.  Pt's thought content suggested religious preoccupation -- he has a history of praying and making numerous references to God and the Bible. When Thereasa Parkin initially tried to speak with him, he was on the phone with a prayer line.  Pt may have cognitive difficulties, but Thereasa Parkin does not have any documentation to that effect.  Pt's memory and concentration  were intact.  Impulse control today was good (compare with a week ago, however, when he ingested 20 tabs).  Pt's insight and judgment were fair.  Author consulted with Dr. Jama Flavors.  As Pt denies suicidal ideation and other symptoms, and as he has a support team who are now aware of his location, it is recommended he be discharged.  Diagnosis: F 25.0 Schizoaffective Disorder, Bipolar Type   Past Medical History:  Past Medical History:  Diagnosis Date  . Anxiety   . Depression   . Diabetes mellitus without complication (HCC)   . Homelessness   . Hypertension   . Schizophrenia (HCC)     History reviewed. No pertinent surgical history.  Family History:  Family History  Problem Relation Age of Onset  . Diabetes Other     Social History:  reports that he has been smoking cigarettes.  He has been smoking about 0.00 packs per day. he has never used smokeless tobacco. He reports that he uses drugs. Drug: Marijuana. He reports that he does not drink alcohol.  Additional Social History:  Alcohol / Drug Use Pain Medications: See MAR Prescriptions: See MAR Over the Counter: See MAR History of alcohol / drug use?: Yes Substance #1 Name of Substance 1: Marijuana (per report) -- UDS/BAC not available at time of assessment  CIWA: CIWA-Ar BP: (!) 154/90 Pulse Rate: 100 COWS:    Allergies:  Allergies  Allergen Reactions  . Haldol [Haloperidol Lactate] Other (See Comments)    Makes him feel slow    Home Medications:  (Not in a hospital admission)  OB/GYN Status:  No LMP for male patient.  General Assessment Data Location of Assessment: [facility] ED TTS Assessment: In system Is this a Tele or Face-to-Face Assessment?: Tele Assessment Is this an Initial Assessment or a Re-assessment for this encounter?: Initial Assessment Marital status: Single Living Arrangements: Other (Comment)(Homeless; he stated he got kicked out of shelter) Can pt return to current living arrangement?:  Yes Admission Status: Voluntary Is patient capable of signing voluntary admission?: Yes Referral Source: Self/Family/Friend Insurance type: [place] MCD     Crisis Care Plan Living Arrangements: Other (Comment)(Homeless; he stated he got kicked out of shelter) Legal Guardian: Other:([place] DSS; John Copeland [phone]) Name of Psychiatrist: [facility] Name of Therapist: [facility]  Education Status Is patient currently in school?: No Highest grade of school patient has completed: 8th  Risk to self with the past 6 months Suicidal Ideation: No Has patient been a risk to self within the past 6 months prior to admission? : Yes Suicidal Intent: No Has patient had any suicidal intent within the past 6 months prior to admission? : No Is patient at risk for suicide?: No Suicidal Plan?: No(But see notes) Has patient had any suicidal plan within the past 6 months prior to admission? : No Access to Means: No What has been your use of drugs/alcohol within the last 12 months?: Marijuana(per report; UDS na at time of assessment) Previous Attempts/Gestures: No(No, but see notes) Other Self Harm Risks:  Homeless; off meds Triggers for Past Attempts: None known Intentional Self Injurious Behavior: None Family Suicide History: No Recent stressful life event(s): Other (Comment)(Homeless) Persecutory voices/beliefs?: No Depression: No Substance abuse history and/or treatment for substance abuse?: No Suicide prevention information given to non-admitted patients: Not applicable  Risk to Others within the past 6 months Homicidal Ideation: No Does patient have any lifetime risk of violence toward others beyond the six months prior to admission? : No Thoughts of Harm to Others: No Current Homicidal Intent: No Current Homicidal Plan: No Access to Homicidal Means: No History of harm to others?: No Assessment of Violence: None Noted Does patient have access to weapons?: No Criminal Charges  Pending?: No Does patient have a court date: No Is patient on probation?: No  Psychosis Hallucinations: None noted Delusions: None noted  Mental Status Report Appearance/Hygiene: Unremarkable, Other (Comment)(street clothes) Eye Contact: Fair Motor Activity: Unremarkable Speech: Slow, Soft Level of Consciousness: Alert Mood: Preoccupied Affect: Anxious Anxiety Level: Moderate Thought Processes: Coherent, Relevant Judgement: Partial Orientation: Person, Place, Time Obsessive Compulsive Thoughts/Behaviors: None  Cognitive Functioning Concentration: Good Memory: Remote Intact, Recent Intact IQ: Average(Per report, low IQ, but no additional info) Level of Function: (Unknown) Insight: Fair Impulse Control: Fair Appetite: Good Sleep: No Change Total Hours of Sleep: 8 Vegetative Symptoms: None  ADLScreening [facility] Assessment Services) Patient's cognitive ability adequate to safely complete daily activities?: Yes Patient able to express need for assistance with ADLs?: No Independently performs ADLs?: Yes (appropriate for developmental age)  Prior Inpatient Therapy Prior Inpatient Therapy: Yes Prior Therapy Dates: 2018 - multiple admissions Prior [facility] Reason for Treatment: psychosis  Prior Outpatient Therapy Prior Outpatient Therapy: Yes Prior Therapy Dates: currently Prior Therapy Facilty/Provider(s): [facility] Reason for Treatment: schizoaffective d/o, med management Does patient have an ACCT team?: Yes([facility]; John SilvasAlfred Miller [phone]) Does patient have Intensive In-House Services?  : No Does patient have [facility] services? : No Does patient have P4CC services?: No  ADL Screening (condition at time of admission) Patient's cognitive ability adequate to safely complete daily activities?: Yes Is the patient deaf or have difficulty hearing?: No Does the patient have difficulty seeing, even when wearing glasses/contacts?: No Does the  patient have difficulty concentrating, remembering, or making decisions?: No Patient able to express need for assistance with ADLs?: No Does the patient have difficulty dressing or bathing?: No Independently performs ADLs?: Yes (appropriate for developmental age) Does the patient have difficulty walking or climbing stairs?: No Weakness of Legs: None Weakness of Arms/Hands: None  Home Assistive Devices/Equipment Home Assistive Devices/Equipment: None  Therapy Consults (therapy consults require a physician order) PT Evaluation Needed: No OT Evalulation Needed: No SLP Evaluation Needed: No Abuse/Neglect Assessment (Assessment to be complete while patient is alone) Abuse/Neglect Assessment Can Be Completed: Yes Physical Abuse: Denies Verbal Abuse: Denies Sexual Abuse: Denies Exploitation of patient/patient's resources: Denies Self-Neglect: Denies Values / Beliefs Cultural Requests During Hospitalization: None Spiritual Requests During Hospitalization: None Consults Spiritual Care Consult Needed: No Social Work Consult Needed: No Merchant navy officerAdvance Directives (For Healthcare) Does Patient Have a Medical Advance Directive?: No Would patient like information on creating a medical advance directive?: No - Patient declined    Additional Information 1:1 In Past 12 Months?: No CIRT Risk: No Elopement Risk: No Does patient have medical clearance?: Yes     Disposition:  Disposition Initial Assessment Completed for this Encounter: Yes Disposition of Patient: Other dispositions Other disposition(s): To current provider(Pt has ACTT and guardian; both advised)  This service  was provided via telemedicine using a 2-way, interactive audio and video technology.  Names of all persons participating in this telemedicine service and their role in this encounter. Name: Trenden Hazelrigg Role: Patient  Name: John Copeland ([phone]) Role: DSS legal guardian  Name: [facility] Representative [phone]) Role:  ACT Team       Earline Mayotte 06/09/2017 6:44 PM

## 2017-06-09 NOTE — ED Notes (Signed)
[facility] [place] [facility] [place] She stated patient has a bed at [facility]. RN attempted to call [facility] but no one on call. Provider aware

## 2017-06-09 NOTE — ED Notes (Signed)
Pt. Continuing to come out of room to use restroom. Pt. States he wants cold medication but he doesn't want anything to make him sleepy.

## 2017-06-09 NOTE — ED Provider Notes (Signed)
[name] EMERGENCY DEPARTMENT Provider Note   CSN: [id number] Arrival date & time: 06/09/17  1708     History   Chief Complaint Chief Complaint  Patient presents with  . Medical Clearance    HPI John Copeland is a 27 y.o. male.  HPI   Pt is a 27 y/o male with a h/o homelessness, schizophrenia, who presents to the ED c/o a cold. States he has had rhinorrhea, cough with green sputum, sore throat for about 3 weeks. Has tried taking robitussin, cough and cold tablets with no relief.  Denies chest pain, SOB, fevers, abd pain, NVD, constipation, urinary sxs, ear pain. Denies SI/HI, auditory hallucinations, visual hallucinations. He is denying any other symptoms during my evaluation.  Upon entering the room, pt is not in the room. He was walking in the hall and began to cry stating that he was not able to shower last night. He is preoccupied that he does not feel clean and is frantically trying to find his cologne during my evaluation.  Past Medical History:  Diagnosis Date  . Anxiety   . Depression   . Diabetes mellitus without complication (HCC)   . Homelessness   . Hypertension   . Schizophrenia [facility])     Patient Active Problem List   Diagnosis Date Noted  . Accidental drug overdose   . Schizophrenia (HCC) 03/29/2017  . 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 Diphenhydramine overdose of undetermined intent 01/10/2017  . Tobacco use disorder 10/07/2016  . Diabetes (HCC) 10/07/2016  . HTN (hypertension) 10/07/2016  . Dyslipidemia 10/07/2016  . Schizoaffective disorder, bipolar type (HCC) 11/30/2015    History reviewed. No pertinent surgical history.     Home Medications    Prior to Admission medications   Medication Sig Start Date End Date Taking? Authorizing Provider  ARIPiprazole (ABILIFY) 20 MG tablet Take 20 mg by mouth daily.    [provider]  atorvastatin (LIPITOR) 10 MG tablet Take 10 mg by mouth daily.    [provider]    benztropine (COGENTIN) 1 MG tablet Take 1 mg by mouth daily.    [provider]  dextromethorphan-guaiFENesin (MUCINEX DM) 30-600 MG 12hr tablet Take 1 tablet by mouth daily. 06/09/17   Loucile Posner S, PA-C  diphenhydrAMINE (BENADRYL) 50 MG tablet Take 50 mg by mouth at bedtime.    [provider]  divalproex (DEPAKOTE) 500 MG DR tablet Take 500 mg by mouth every 12 (twelve) hours.    [provider]  doxycycline (VIBRAMYCIN) 100 MG capsule Take 1 capsule (100 mg total) by mouth 2 (two) times daily. 06/07/17   Roxy Horseman, PA-C  fluticasone (FLONASE) 50 MCG/ACT nasal spray Place 2 sprays into both nostrils daily. 06/01/17   Muthersbaugh, Dahlia Client, PA-C  fluvoxaMINE (LUVOX) 100 MG tablet Take 100 mg by mouth at bedtime.    [provider]  Guaifenesin 1200 MG TB12 Take 1 tablet (1,200 mg total) by mouth 2 (two) times daily. 05/24/17   Lawyer, Cristal Deer, PA-C  haloperidol (HALDOL) 1 MG tablet Take 1 mg by mouth 2 (two) times daily.    [provider]  lisinopril (PRINIVIL,ZESTRIL) 10 MG tablet Take 10 mg by mouth daily.    [provider]  metFORMIN (GLUCOPHAGE) 500 MG tablet Take 1,000 mg by mouth 2 (two) times daily with a meal.    [provider]  Multiple Vitamin (MULTIVITAMIN) tablet Take 1 tablet by mouth daily.    [provider]  promethazine-dextromethorphan (PROMETHAZINE-DM) 6.25-15 MG/5ML  syrup Take 5 mLs by mouth 4 (four) times daily as needed for cough. Patient not taking: Reported on 05/28/2017 05/24/17   Charlestine Night, PA-C  traZODone (DESYREL) 100 MG tablet Take 100 mg by mouth at bedtime.    [provider]    Family History Family History  Problem Relation Age of Onset  . Diabetes Other     Social History Social History   Tobacco Use  . Smoking status: Current Every Day Smoker    Packs/day: 0.00    Types: Cigarettes  . Smokeless tobacco: Never Used  Substance Use Topics  .  Alcohol use: No  . Drug use: Yes    Types: Marijuana    Comment: denies drug use     Allergies   Haldol [haloperidol lactate]   Review of Systems Review of Systems  Constitutional: Negative for fever.  HENT: Positive for congestion, rhinorrhea and sore throat. Negative for ear pain.   Respiratory: Positive for cough. Negative for shortness of breath.   Cardiovascular: Negative for chest pain.  Gastrointestinal: Negative for abdominal pain, constipation, diarrhea, nausea and vomiting.  Genitourinary: Negative for flank pain, frequency, hematuria and urgency.  Musculoskeletal: Negative for back pain.  Neurological: Negative for headaches.  Psychiatric/Behavioral: Negative for hallucinations and suicidal ideas.       Denies HI     Physical Exam Updated Vital Signs BP (!) 154/90   Pulse 100   Temp 99 F (37.2 C) (Oral)   Resp 18   SpO2 99%   Physical Exam  Constitutional: He is oriented to person, place, and time. He appears well-developed and well-nourished.  HENT:  Head: Normocephalic and atraumatic.  Right Ear: External ear normal.  Left Ear: External ear normal.  Nose: Nose normal.  Mouth/Throat: Oropharynx is clear and moist.  bilat TMS normal  Eyes: Conjunctivae and EOM are normal. Pupils are equal, round, and reactive to light.  Neck: Normal range of motion. Neck supple.  Cardiovascular: Normal rate, regular rhythm, normal heart sounds and intact distal pulses.  No murmur heard. Pulmonary/Chest: Effort normal and breath sounds normal. No stridor. No respiratory distress. He has no wheezes.  Abdominal: Soft. Bowel sounds are normal. He exhibits no distension. There is no tenderness.  Musculoskeletal: Normal range of motion. He exhibits no edema.  Lymphadenopathy:    He has no cervical adenopathy.  Neurological: He is alert and oriented to person, place, and time.  Skin: Skin is warm and dry.  Psychiatric:  Anxious, labile. Denies SI/HI, auditory/visual  hallucinations  Nursing note and vitals reviewed.    ED Treatments / Results  Labs (all labs ordered are listed, but only abnormal results are displayed) Labs Reviewed  CBC - Abnormal; Notable for the following components:      Result Value   Hemoglobin 11.8 (*)    HCT 37.3 (*)    MCV 74.6 (*)    MCH 23.6 (*)    Platelets 412 (*)    All other components within normal limits  BASIC METABOLIC PANEL - Abnormal; Notable for the following components:   CO2 20 (*)    Glucose, Bld 105 (*)    BUN 5 (*)    All other components within normal limits  URINALYSIS, ROUTINE W REFLEX MICROSCOPIC - Abnormal; Notable for the following components:   Color, Urine STRAW (*)    All other components within normal limits  VALPROIC ACID LEVEL - Abnormal; Notable for the following components:   Valproic Acid Lvl <10 (*)  All other components within normal limits  CBG MONITORING, ED - Abnormal; Notable for the following components:   Glucose-Capillary 159 (*)    All other components within normal limits  ACETAMINOPHEN LEVEL  SALICYLATE LEVEL  RAPID URINE DRUG SCREEN, HOSP PERFORMED    EKG  EKG Interpretation None       Radiology No results found.  Procedures Procedures (including critical care time)  Medications Ordered in ED Medications  LORazepam (ATIVAN) tablet 1 mg (1 mg Oral Given 06/09/17 1757)     Initial Impression / Assessment and Plan / ED Course  I have reviewed the triage vital signs and the nursing notes.  Pertinent labs & imaging results that were available during my care of the patient were reviewed by me and considered in my medical decision making (see chart for details).   Spoke with TTS and discussed that patient is very anxious on exam and psychiatrically appears to be different when he was last seen in the ED yesterday. They state they will consult on the pt.    6:40 Attempted to recheck pt. He was in the bathroom brushing his teeth.  Reviewed note from TTS  which indicates that they feel patient is safe to be discharged.  [facility] is [facility] living [facility] so will plan to find pt a ride to get there safely. Nursing able to give voucher for cab to take to [facility]. [facility] was contacted and has a bed for patient.  Contacted pt's legal guardian, Magdalene River. Informed him that pt is in ED, has been evaluated/cleared by TTS, and is safe for discharge. Guardian is aware.  Discussed plan to discharge patient as he likely has viral URI. He asks multiple times if he can have more medication for his cold. I will discharge with short course of mucinex. Discussed reasons to return to the ER and that he needs to f/u with primary care doctor.   Final Clinical Impressions(s) / ED Diagnoses   Final diagnoses:  Upper respiratory tract infection, unspecified type  Medical clearance for psychiatric admission   27 y/o male presenting with URI sxs. He is afebrile and nontoxic appearing. His BP and HR are slightly elevated which I suspect is due to him being anxious and continually  walking around during his stay in the ED. Also it appears his HTN is chronic based on prior visits.  During my cardiac eval HR was normal. He has no evidence of respiratory distress and his pulmonary exam was benign. No indication for CXR at this time. HENT also within NL. I believe he likely has a viral URI that can be treated with supportive care. His lab work is reassuring. Rx given for short course of mucinex.   Of note, pt has been seen in the ED at least 12 times this month. Has been seen for URI sxs multiple times and has been given multiple prescriptions for antibiotics and other various medications for cold sxs. He had a negative CXR on 1/15 and has been afrebrile during all subsequent visits to the ED. During one ED visit pt admitted to abusing robitussin. He also had reported over dose of doxycycline earlier this month.  TTS was consulted because pt was  very anxious on my exam and mood was labile. His affect seemed different than what was documented during ED visit on 1/25. TTS evaluated pt and recommended that pt be discharged as he does not meet inpatient criteria. His care team [facility]. Legal guardian was contacted by myself and was made aware of patients visit to the ED, evaluation/clearance by TTS, and plan for discharge. Pt continues to denies SI/HI and auditory/visual hallucinations. He is safe for discharge from a medical and psychiatric standpoint.  [facility] was contacted and confirmed that pt has bed there. He was given voucher for taxi to get a ride there.  ED Discharge Orders        Ordered    dextromethorphan-guaiFENesin [facility](MUCINEX DM) 30-600 MG 12hr tablet  Daily     06/09/17 1938       Karrie MeresCouture, Rylei Codispoti S, PA-C 06/10/17 0221    Justice Milliron S, PA-C 06/10/17 0225    Nira Connardama, Pedro Eduardo, MD 06/10/17 2129

## 2017-06-10 ENCOUNTER — Emergency Department (HOSPITAL_COMMUNITY)
Admission: EM | Admit: 2017-06-10 | Discharge: 2017-06-13 | Disposition: A | Discharge status: Other Acute Inpatient Hospital | Payer: Medicaid Other | Attending: Emergency Medicine | Admitting: Emergency Medicine

## 2017-06-10 ENCOUNTER — Other Ambulatory Visit: Payer: Self-pay

## 2017-06-10 ENCOUNTER — Encounter (HOSPITAL_COMMUNITY): Payer: Self-pay | Admitting: Nurse Practitioner

## 2017-06-10 DIAGNOSIS — Z9114 Patient's other noncompliance with medication regimen: Secondary | ICD-10-CM | POA: Insufficient documentation

## 2017-06-10 DIAGNOSIS — E119 Type 2 diabetes mellitus without complications: Secondary | ICD-10-CM | POA: Diagnosis not present

## 2017-06-10 DIAGNOSIS — Z7984 Long term (current) use of oral hypoglycemic drugs: Secondary | ICD-10-CM | POA: Insufficient documentation

## 2017-06-10 DIAGNOSIS — R451 Restlessness and agitation: Secondary | ICD-10-CM | POA: Diagnosis not present

## 2017-06-10 DIAGNOSIS — I10 Essential (primary) hypertension: Secondary | ICD-10-CM | POA: Diagnosis not present

## 2017-06-10 DIAGNOSIS — R4689 Other symptoms and signs involving appearance and behavior: Secondary | ICD-10-CM

## 2017-06-10 DIAGNOSIS — F209 Schizophrenia, unspecified: Secondary | ICD-10-CM | POA: Insufficient documentation

## 2017-06-10 DIAGNOSIS — Z79899 Other long term (current) drug therapy: Secondary | ICD-10-CM | POA: Diagnosis not present

## 2017-06-10 DIAGNOSIS — F17210 Nicotine dependence, cigarettes, uncomplicated: Secondary | ICD-10-CM | POA: Insufficient documentation

## 2017-06-10 DIAGNOSIS — F918 Other conduct disorders: Secondary | ICD-10-CM | POA: Insufficient documentation

## 2017-06-10 DIAGNOSIS — Z046 Encounter for general psychiatric examination, requested by authority: Secondary | ICD-10-CM | POA: Insufficient documentation

## 2017-06-10 NOTE — ED Triage Notes (Signed)
Pt presents to ED for evaluation for aggressive behavior at [facility]. Pt presents with member of ACT team who states that shelter staff notes pt mood is unstable and he has been aggressive and hypomanic. Pt has been off of his medications for the past few weeks. Pt sts has stopped due to SE of constipation, mental slowness and weight gain. Pt denies mood instability and states "I was defending myself" when discussing aggressive behavior. Pt denies SI/HI

## 2017-06-10 NOTE — ED Provider Notes (Signed)
Patient placed in Quick Look pathway, seen and evaluated   Chief Complaint: Aggressive behavior  HPI:   Patient with a history of schizophrenia who is apparently noncompliant with medications is here for aggressive behavior.  He was brought in by acting representative who [facility]  Patient was witnessed being verbally and physically aggressive with staff and other residents.  They feel that he has decompensated and requires behavioral health evaluation and likely admission.  ROS: Denies any fevers, chills, chest pain, shortness of breath.  (one)  Physical Exam:   Gen: No distress  Neuro: Awake and Alert  Skin: Warm    Focused Exam: Patient is labile.  Goes from being calm to agitated very quickly.  He is verbally aggressive with staff here in the emergency department.  I filled out IVC paperwork.  I am currently having difficulty placing orders on this patient.  Registration is currently working on this.  He will need to be placed in behavioral health holding area for behavioral health evaluation.  Patient was seen here yesterday and had medical clearance labs drawn at that time which were grossly reassuring.   Initiation of care has begun. The patient has been counseled on the process, plan, and necessity for staying for the completion/evaluation, and the remainder of the medical screening examination    Cardama, Amadeo GarnetPedro Eduardo, MD 06/10/17 2003

## 2017-06-10 NOTE — ED Provider Notes (Signed)
[facility] EMERGENCY DEPARTMENT Provider Note  CSN: [id number] Arrival date & time: 06/10/17 2008  Chief Complaint(s) Psychiatric Evaluation  HPI John Copeland is a 27 y.o. male Patient with a history of schizophrenia who is apparently noncompliant with medications is here for aggressive behavior.  He was brought in by acting representative who went to evaluate the patient at [facility].  Patient was witnessed being verbally and physically aggressive with staff and other residents.  They feel that he has decompensated and requires behavioral health evaluation and likely admission  HPI  Past Medical History Past Medical History:  Diagnosis Date  . Anxiety   . Depression   . Diabetes mellitus without complication (HCC)   . Homelessness   . Hypertension   . Schizophrenia [facility])    Patient Active Problem List   Diagnosis Date Noted  . Accidental drug overdose   . Schizophrenia (HCC) 03/29/2017  . 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 Diphenhydramine overdose of undetermined intent 01/10/2017  . Tobacco use disorder 10/07/2016  . Diabetes (HCC) 10/07/2016  . HTN (hypertension) 10/07/2016  . Dyslipidemia 10/07/2016  . Schizoaffective disorder, bipolar type (HCC) 11/30/2015   Home Medication(s) Prior to Admission medications   Medication Sig Start Date End Date Taking? Authorizing Provider  ARIPiprazole (ABILIFY) 20 MG tablet Take 20 mg by mouth daily.    [provider]  atorvastatin (LIPITOR) 10 MG tablet Take 10 mg by mouth daily.    [provider]  benztropine (COGENTIN) 1 MG tablet Take 1 mg by mouth daily.    [provider]  dextromethorphan-guaiFENesin (MUCINEX DM) 30-600 MG 12hr tablet Take 1 tablet by mouth daily. 06/09/17   Couture, Cortni S, PA-C  diphenhydrAMINE (BENADRYL) 50 MG tablet Take 50 mg by mouth at bedtime.    [provider]  divalproex (DEPAKOTE) 500 MG DR tablet Take 500 mg by mouth every 12 (twelve)  hours.    [provider]  doxycycline (VIBRAMYCIN) 100 MG capsule Take 1 capsule (100 mg total) by mouth 2 (two) times daily. 06/07/17   Roxy Horseman, PA-C  fluticasone (FLONASE) 50 MCG/ACT nasal spray Place 2 sprays into both nostrils daily. 06/01/17   Muthersbaugh, Dahlia Client, PA-C  fluvoxaMINE (LUVOX) 100 MG tablet Take 100 mg by mouth at bedtime.    [provider]  Guaifenesin 1200 MG TB12 Take 1 tablet (1,200 mg total) by mouth 2 (two) times daily. 05/24/17   Lawyer, Cristal Deer, PA-C  haloperidol (HALDOL) 1 MG tablet Take 1 mg by mouth 2 (two) times daily.    [provider]  lisinopril (PRINIVIL,ZESTRIL) 10 MG tablet Take 10 mg by mouth daily.    [provider]  metFORMIN (GLUCOPHAGE) 500 MG tablet Take 1,000 mg by mouth 2 (two) times daily with a meal.    [provider]  Multiple Vitamin (MULTIVITAMIN) tablet Take 1 tablet by mouth daily.    [provider]  promethazine-dextromethorphan (PROMETHAZINE-DM) 6.25-15 MG/5ML syrup Take 5 mLs by mouth 4 (four) times daily as needed for cough. Patient not taking: Reported on 05/28/2017 05/24/17   Charlestine Night, PA-C  traZODone (DESYREL) 100 MG tablet Take 100 mg by mouth at bedtime.    [provider]  Past Surgical History No past surgical history on file. Family History Family History  Problem Relation Age of Onset  . Diabetes Other     Social History Social History   Tobacco Use  . Smoking status: Current Every Day Smoker    Packs/day: 0.00    Types: Cigarettes  . Smokeless tobacco: Never Used  Substance Use Topics  . Alcohol use: No  . Drug use: Yes    Types: Marijuana    Comment: denies drug use   Allergies Haldol [haloperidol lactate]  Review of Systems Review of Systems  Unable to perform ROS: Psychiatric disorder     Physical Exam Vital Signs  I have reviewed the triage vital signs BP (!) 144/83 (BP Location: Right Arm)   Pulse 96   Temp 97.6 F (36.4 C) (Axillary)   Resp 18   SpO2 98%   Physical Exam  Constitutional: He is oriented to person, place, and time. He appears well-developed and well-nourished. No distress.  HENT:  Head: Normocephalic and atraumatic.  Right Ear: External ear normal.  Left Ear: External ear normal.  Nose: Nose normal.  Mouth/Throat: Mucous membranes are normal. No trismus in the jaw.  Eyes: Conjunctivae and EOM are normal. No scleral icterus.  Neck: Normal range of motion and phonation normal.  Cardiovascular: Normal rate and regular rhythm.  Pulmonary/Chest: Effort normal. No stridor. No respiratory distress.  Abdominal: He exhibits no distension.  Musculoskeletal: Normal range of motion. He exhibits no edema.  Neurological: He is alert and oriented to person, place, and time.  Skin: He is not diaphoretic.  Psychiatric: His affect is labile. He is agitated.   Patient is labile.  Goes from being calm to agitated very quickly.  He is verbally aggressive with staff here in the emergency department.  Vitals reviewed.   ED Results and Treatments Labs (all labs ordered are listed, but only abnormal results are displayed) Labs Reviewed - No data to display                                                                                                                       EKG  EKG Interpretation  Date/Time:    Ventricular Rate:    PR Interval:    QRS Duration:   QT Interval:    QTC Calculation:   R Axis:     Text Interpretation:        Radiology No results found. Pertinent labs & imaging results that were available during my care of the patient were reviewed by me and considered in my medical decision making (see chart for details).  Medications Ordered in ED Medications - No data to display  Procedures Procedures  (including critical care time)  Medical Decision Making / ED Course I have reviewed the nursing notes for this encounter and the patient's prior records (if available in EHR or on provided paperwork).    I filled out IVC paperwork.  Patient was seen here yesterday and had medical clearance labs drawn at that time which were grossly reassuring.  Patient is medically cleared for behavioral health evaluation and management.  Final Clinical Impression(s) / ED Diagnoses Final diagnoses:  Aggressive behavior      This chart was dictated using voice recognition software.  Despite best efforts to proofread,  errors can occur which can change the documentation meaning.   Nira Conn, MD 06/10/17 2128

## 2017-06-10 NOTE — ED Triage Notes (Signed)
Pt presents to ED for evaluation for aggressive behavior at [facility]. Pt presents with member of ACT team who states that shelter staff notes pt mood is unstable and he has been aggressive and hypomanic. Pt has been off of his medications for the past few weeks. Pt sts has stopped due to SE of constipation, mental slowness and weight gain. Pt denies mood instability and states "I was defending myself" when discussing aggressive behavior. Pt denies SI/HI 

## 2017-06-11 LAB — RAPID URINE DRUG SCREEN, HOSP PERFORMED
AMPHETAMINES: NOT DETECTED
BARBITURATES: NOT DETECTED
BENZODIAZEPINES: NOT DETECTED
Cocaine: NOT DETECTED
Opiates: NOT DETECTED
Tetrahydrocannabinol: NOT DETECTED

## 2017-06-11 MED ORDER — BENZTROPINE MESYLATE 1 MG PO TABS
1.0000 mg | ORAL_TABLET | Freq: Every day | ORAL | Status: DC
  Administered 2017-06-12 – 2017-06-13 (×2): 1 mg via ORAL
  Filled 2017-06-11 (×2): qty 1

## 2017-06-11 MED ORDER — DM-GUAIFENESIN ER 30-600 MG PO TB12: 1.0000 | ORAL_TABLET | Freq: Every day | ORAL | Status: DC

## 2017-06-11 MED ORDER — FLUVOXAMINE MALEATE 50 MG PO TABS
100.0000 mg | ORAL_TABLET | Freq: Every day | ORAL | Status: DC
  Filled 2017-06-11: qty 2

## 2017-06-11 MED ORDER — ATORVASTATIN CALCIUM 10 MG PO TABS
10.0000 mg | ORAL_TABLET | Freq: Every day | ORAL | Status: DC
  Administered 2017-06-12 – 2017-06-13 (×2): 10 mg via ORAL
  Filled 2017-06-11 (×2): qty 1

## 2017-06-11 MED ORDER — TRAZODONE HCL 100 MG PO TABS
100.0000 mg | ORAL_TABLET | Freq: Every day | ORAL | Status: DC
  Filled 2017-06-11: qty 1

## 2017-06-11 MED ORDER — DIVALPROEX SODIUM 250 MG PO DR TAB
500.0000 mg | DELAYED_RELEASE_TABLET | Freq: Two times a day (BID) | ORAL | Status: DC
  Administered 2017-06-12 – 2017-06-13 (×2): 500 mg via ORAL
  Filled 2017-06-11 (×3): qty 2

## 2017-06-11 MED ORDER — METFORMIN HCL 500 MG PO TABS
1000.0000 mg | ORAL_TABLET | Freq: Two times a day (BID) | ORAL | Status: DC
  Administered 2017-06-13 (×2): 1000 mg via ORAL
  Filled 2017-06-11 (×2): qty 2

## 2017-06-11 MED ORDER — LISINOPRIL 10 MG PO TABS
10.0000 mg | ORAL_TABLET | Freq: Every day | ORAL | Status: DC
  Administered 2017-06-12 – 2017-06-13 (×2): 10 mg via ORAL
  Filled 2017-06-11 (×2): qty 1

## 2017-06-11 MED ORDER — ARIPIPRAZOLE 10 MG PO TABS
20.0000 mg | ORAL_TABLET | Freq: Every day | ORAL | Status: DC
  Administered 2017-06-11 – 2017-06-13 (×3): 20 mg via ORAL
  Filled 2017-06-11 (×4): qty 2

## 2017-06-11 NOTE — ED Notes (Addendum)
Pt lying on bed w/eyes closed. Respirations even, unlabored. Pt yelling out "I'm hungry. I want some real food". Advised pt lunch tray should be arriving soon. Sitter had offered pt graham crackers w/peanut butter earlier and pt yelled at [employer]itter "I don't want that! I want real food! Get out of my room!". Pt refused to have VS taken at this time. Will re-attempt later.
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
## 2017-06-11 NOTE — ED Notes (Signed)
2200 meds not given due to pt sleeping and aggressive behavior.
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
## 2017-06-11 NOTE — Progress Notes (Signed)
CSW reviewed Pt chart.  Pt meets inpatient criteria per Ferne ReusJustina Okonkwo, NP.  Patient referrals sent to the following for review:  [facility]     [facility]  [facility]  [facility]  [facility]  [facility]  [facility]  [facility]  [facility]  [facility]  [facility]  [facility]  [facility]        Disposition CSW eo continue to follow for placement.  Timmothy EulerJean T. Kaylyn LimSutter, MSW, LCSWA Disposition Clinical Social Work [phone] (cell) [phone] (office)

## 2017-06-11 NOTE — ED Notes (Signed)
Re-TTS being performed.  

## 2017-06-11 NOTE — ED Notes (Addendum)
Woke pt so may eat dinner and RN may obtain VS. Pt stated he was hungry and is refusing to have VS's taken. RN advised pt will request Security to come assist. Pt then stated "OK, you can do it!" Pt then stated in angry tone he wants the head of his bed laid down. Advised pt he may do this himself - stated "I did and it didn't workAir traffic controller!" RN adjusted bed for pt. Pt noted to be irritable. VS's taken. Pt initially refusing to take meds - states "I told you and them I'm allergic to them". Pt then agreed to take med - yelling "Just bring me the fucking medicine!"

## 2017-06-11 NOTE — ED Notes (Signed)
Woke pt and advised meal tray has been delivered - pt returned to sleeping.

## 2017-06-11 NOTE — BH Assessment (Signed)
[facility] Assessment Progress Note  Clinician contacted pt's ACTT team, [facility] [phone]) for collateral information and talked to [facility], employment specialist on pt's team and Cordelia PenSherry, nurse, on pt's team. They report that pt has been very aggressive "towards everybody" and non compliant in taking his meds. Pt has also been very hard to keep track of, as he reportedly spends his time between the various EDs, police departments, and homeless shelters. Pt was started on 5mg  Abilify in December with hopes of increasing/changing his dose to a long acting injectible, but they have been unable to track him down long enough to do this. Pt's baseline is following commands "a little better" than he is doing at this time.

## 2017-06-11 NOTE — ED Notes (Signed)
Pt would not stay awake for TTS; pt placed blanket over head while TTS in process; TTS to be redone in the AM and ACT is to be called in the AM-Monique,RN

## 2017-06-11 NOTE — ED Notes (Addendum)
When RN asked to scan pt's armband, pt tore it from his wrist and threw it at RN. RN placed on bedside table.

## 2017-06-12 NOTE — ED Notes (Signed)
Pt saying he will take night medications, RN got medications and pt stated "No I will not take them I am allergic." Rn said it is not in his allergies and pt responded with "No" Pt also allowed BP to be taken but refused temperature and pulse ox

## 2017-06-12 NOTE — ED Notes (Signed)
A Snack was placed on patient bedside table.

## 2017-06-12 NOTE — ED Notes (Signed)
Regular Diet was ordered for Lunch. 

## 2017-06-12 NOTE — Progress Notes (Addendum)
Disposition CSW called pt's court-appointed guardian, John Copeland [phone]), who related that he is seeking another group home placement for pt and that this is the third placement in 45 days for the patient, who gets d/c'd from group homes due to his aggressive behavior, and the 9th placement since June of 2018. Guardian requests that patient medications be reviewed and he be considered for the Invega shot as he is largely non-compliant with p.o. meds.  CSW will confer with TTS treatment team.  John Copeland, MSW, LCSWA Disposition Clinical Social Work [phone] (cell) [phone] (office)  After consult with TTS team, Patient disposition remains: Inpatient seek placement.

## 2017-06-12 NOTE — ED Notes (Signed)
Breakfast tray ordered 

## 2017-06-12 NOTE — ED Notes (Signed)
Patient was given 2 Malawiurkey sandwich bag w/ Milk and cranberry Juice for snack.

## 2017-06-12 NOTE — ED Notes (Signed)
Patient refused to take all medication stating that he is "allergic" to everything. Patient hostile to sitter and RN stating "get out of my room". Explained to patient that both were here to help him and that there were rules to follow. Patient did not argue.

## 2017-06-12 NOTE — ED Notes (Signed)
Patient refused medication at this time. Patient also refused GBC stating "it hurts".

## 2017-06-12 NOTE — ED Notes (Signed)
Lunch tray given. Patient observed eating using no utensils and shoving food into mouth using both hands.

## 2017-06-12 NOTE — BHH Counselor (Signed)
Pt is a 27 year old male who [facility].  Her was brought to his hospital by ACT Team.  Author consulted with ACT Team ([facility], [place]).  ACT Team representative stated that they found Pt at a shelter in [place].  Per report, Pt is off his medication because he believes that he is allergic to all of it, and that he is becoming increasingly aggressive.  ACT Team rep also stated that Pt is not at base-line.  Per Dr. Lucianne MussKumar, Pt continues to meet inpatient criteria.

## 2017-06-12 NOTE — ED Notes (Signed)
Patient eating breakfast at this time.

## 2017-06-13 ENCOUNTER — Encounter (HOSPITAL_COMMUNITY): Payer: Self-pay

## 2017-06-13 ENCOUNTER — Other Ambulatory Visit: Payer: Self-pay

## 2017-06-13 ENCOUNTER — Inpatient Hospital Stay (HOSPITAL_COMMUNITY)
Admission: AD | Admit: 2017-06-13 | Discharge: 2017-06-24 | DRG: 885 | Disposition: A | Discharge status: Home / Self Care | Payer: Medicaid Other | Source: Intra-hospital | Attending: Psychiatry | Admitting: Psychiatry

## 2017-06-13 DIAGNOSIS — F203 Undifferentiated schizophrenia: Secondary | ICD-10-CM | POA: Diagnosis not present

## 2017-06-13 DIAGNOSIS — Z7984 Long term (current) use of oral hypoglycemic drugs: Secondary | ICD-10-CM | POA: Diagnosis not present

## 2017-06-13 DIAGNOSIS — Z5320 Procedure and treatment not carried out because of patient's decision for unspecified reasons: Secondary | ICD-10-CM | POA: Diagnosis present

## 2017-06-13 DIAGNOSIS — Z888 Allergy status to other drugs, medicaments and biological substances status: Secondary | ICD-10-CM | POA: Diagnosis not present

## 2017-06-13 DIAGNOSIS — F419 Anxiety disorder, unspecified: Secondary | ICD-10-CM | POA: Diagnosis present

## 2017-06-13 DIAGNOSIS — I10 Essential (primary) hypertension: Secondary | ICD-10-CM | POA: Diagnosis present

## 2017-06-13 DIAGNOSIS — G4700 Insomnia, unspecified: Secondary | ICD-10-CM | POA: Diagnosis present

## 2017-06-13 DIAGNOSIS — E119 Type 2 diabetes mellitus without complications: Secondary | ICD-10-CM | POA: Diagnosis present

## 2017-06-13 DIAGNOSIS — Z8659 Personal history of other mental and behavioral disorders: Secondary | ICD-10-CM | POA: Diagnosis not present

## 2017-06-13 DIAGNOSIS — F209 Schizophrenia, unspecified: Principal | ICD-10-CM | POA: Diagnosis present

## 2017-06-13 DIAGNOSIS — F1220 Cannabis dependence, uncomplicated: Secondary | ICD-10-CM | POA: Diagnosis present

## 2017-06-13 DIAGNOSIS — R451 Restlessness and agitation: Secondary | ICD-10-CM | POA: Diagnosis not present

## 2017-06-13 DIAGNOSIS — F39 Unspecified mood [affective] disorder: Secondary | ICD-10-CM | POA: Diagnosis not present

## 2017-06-13 DIAGNOSIS — F250 Schizoaffective disorder, bipolar type: Secondary | ICD-10-CM | POA: Diagnosis not present

## 2017-06-13 DIAGNOSIS — Z590 Homelessness: Secondary | ICD-10-CM

## 2017-06-13 DIAGNOSIS — F909 Attention-deficit hyperactivity disorder, unspecified type: Secondary | ICD-10-CM | POA: Diagnosis present

## 2017-06-13 DIAGNOSIS — Y9289 Other specified places as the place of occurrence of the external cause: Secondary | ICD-10-CM | POA: Diagnosis not present

## 2017-06-13 DIAGNOSIS — R456 Violent behavior: Secondary | ICD-10-CM | POA: Diagnosis not present

## 2017-06-13 DIAGNOSIS — Z79899 Other long term (current) drug therapy: Secondary | ICD-10-CM | POA: Diagnosis not present

## 2017-06-13 DIAGNOSIS — F918 Other conduct disorders: Secondary | ICD-10-CM | POA: Diagnosis not present

## 2017-06-13 DIAGNOSIS — F200 Paranoid schizophrenia: Secondary | ICD-10-CM | POA: Diagnosis not present

## 2017-06-13 DIAGNOSIS — F17210 Nicotine dependence, cigarettes, uncomplicated: Secondary | ICD-10-CM | POA: Diagnosis present

## 2017-06-13 DIAGNOSIS — F1290 Cannabis use, unspecified, uncomplicated: Secondary | ICD-10-CM | POA: Diagnosis not present

## 2017-06-13 LAB — CBG MONITORING, ED: GLUCOSE-CAPILLARY: 148 mg/dL — AB (ref 65–99)

## 2017-06-13 MED ORDER — ALUM & MAG HYDROXIDE-SIMETH 200-200-20 MG/5ML PO SUSP
30.0000 mL | ORAL | Status: DC | PRN
Start: 2017-06-13 — End: 2017-06-24

## 2017-06-13 MED ORDER — DIPHENHYDRAMINE HCL 50 MG/ML IJ SOLN
INTRAMUSCULAR | Status: AC
  Administered 2017-06-13: 50 mg via INTRAMUSCULAR
  Filled 2017-06-13: qty 1

## 2017-06-13 MED ORDER — CHLORPROMAZINE HCL 25 MG/ML IJ SOLN
50.0000 mg | Freq: Once | INTRAMUSCULAR | Status: AC
  Administered 2017-06-13: 50 mg via INTRAMUSCULAR
  Filled 2017-06-13: qty 2

## 2017-06-13 MED ORDER — LORAZEPAM 2 MG/ML IJ SOLN: 2.0000 mg | Freq: Once | INTRAMUSCULAR | Status: DC

## 2017-06-13 MED ORDER — DIPHENHYDRAMINE HCL 50 MG/ML IJ SOLN
50.0000 mg | Freq: Once | INTRAMUSCULAR | Status: AC
  Administered 2017-06-13: 50 mg via INTRAMUSCULAR
  Filled 2017-06-13: qty 1

## 2017-06-13 MED ORDER — CHLORPROMAZINE HCL 25 MG/ML IJ SOLN
INTRAMUSCULAR | Status: AC
  Administered 2017-06-13: 50 mg via INTRAMUSCULAR
  Filled 2017-06-13: qty 2

## 2017-06-13 MED ORDER — ACETAMINOPHEN 325 MG PO TABS
650.0000 mg | ORAL_TABLET | Freq: Four times a day (QID) | ORAL | Status: DC | PRN
  Administered 2017-06-15 – 2017-06-23 (×6): 650 mg via ORAL
  Filled 2017-06-13 (×6): qty 2

## 2017-06-13 MED ORDER — MAGNESIUM HYDROXIDE 400 MG/5ML PO SUSP: 30.0000 mL | Freq: Every day | ORAL | Status: DC | PRN

## 2017-06-13 NOTE — ED Notes (Signed)
Regular Diet ordered for Dinner. 

## 2017-06-13 NOTE — Progress Notes (Addendum)
[facility]   Dr. Lucianne MussKumar is the accepting provider.  Dr. Altamese [place]ainville is the attending provider.  Call report to [phone]  Cindy @ [facility] notified.   Pt is IVC .  Pt may be transported by [employer] Pt scheduled  [facility] 18[facility]00 PM.  Carney BernJean T. Kaylyn LimSutter, MSW, LCSWA Disposition Clinical Social Work [phone] (cell) [phone] (office)  Disposition CSW called and left a message for Mr. [name], pt's legal guardian @ [phone]

## 2017-06-13 NOTE — Progress Notes (Signed)
Ivin BootyJoshua is a 27 year old male being admitted involuntarily to 506-1 from [facility].  He was brought to the ED by his ACTT team for aggressive behaviors.  He [facility] and the are currently working on finding group home placement because he becomes aggressive and is kick out.  [facility], he was agitated, hostile, refusing to participate in admission process and verbally threatening.  He came running out of the admission room and was trying to leave the unit.  Show of force was called and male NT was able to assist him in getting back in admission room.  Skin assessment and belongings search was completed.  He would not participate in any other questioning. Belongings secured in locker # 36, no contraband found.  Skin assessment completed and no skin issues noted.  Q 15 minute checks initiated for safety.  We will continue to monitor the progress towards his goals.

## 2017-06-13 NOTE — Progress Notes (Signed)
CSW received note ED Secretary asking that CSW speak with pt's LG. CSW reached out to Sempra EnergyChavis Copeland at number provided, however CSW left VM for John at this time. CSW aware that [facility], therefore CSW also left number for [facility] Disposition CSW Carney Bern( Jean) in case Jyl HeinzChavis needs to speak with CSW.      Claude MangesKierra S. Brady Plant, MSW, LCSW-A Emergency Department Clinical Social Worker [phone]

## 2017-06-13 NOTE — ED Notes (Signed)
Took meds w/o issue

## 2017-06-13 NOTE — ED Notes (Addendum)
This RN attempted to give pt night medications and pt shook his head and would not give a verbal answer. This RN asked the pt why and he shook his head no. Sitter attempted to take CBG and pt refused.

## 2017-06-13 NOTE — ED Notes (Signed)
[employer] called to transport pt to [facility] 

## 2017-06-13 NOTE — ED Notes (Signed)
TTS done but he hung on her

## 2017-06-13 NOTE — Progress Notes (Signed)
John Copeland was walked on the unit.  He was verbally aggressive towards RN while trying to given tour.  Orders were obtained for emergency injections.  Male NT was able to talk with him in taking the injections to assist him in calming down.  He was provided supper try and drink.  We will continue to monitor the progress towards his goals.

## 2017-06-13 NOTE — ED Notes (Signed)
Carb Mortified Diet ordered for Lunch. 

## 2017-06-13 NOTE — ED Notes (Signed)
Sitter attempted to take vital signs. Pt refused.

## 2017-06-13 NOTE — BHH Counselor (Signed)
Reassesment: Pt was hostile and guarded during reassessment. He states that he is "ready to go". He denies AVH and states that he has been taking his medications. Per RN report this is untrue and he has been refusing all meds. When challenged about not taking his medications he got agitated and said "I don't want to talk to you".   Kateri PlummerKristin Hiromi Knodel, MamersLCAS, [facility] Lead Triage Specialist  [facility]  Therapeutic Triage Services Phone: [phone] Fax: [phone]

## 2017-06-13 NOTE — Progress Notes (Signed)
  D: Pt observed sleeping in bed with eyes closed. RR even and unlabored. No distress noted  .  A: Q 15 minute checks were done for safety.  R: safety maintained on unit.  

## 2017-06-13 NOTE — Tx Team (Signed)
Initial Treatment Plan 06/13/2017 8:26 PM John MuffJoshua Omar Grindstaff ZOX:[id number]    PATIENT STRESSORS: Financial difficulties Medication change or noncompliance   PATIENT STRENGTHS: General fund of knowledge Physical Health   PATIENT IDENTIFIED PROBLEMS: Aggression     Pt was unwilling to answer questions on admission                 DISCHARGE CRITERIA:  Improved stabilization in mood, thinking, and/or behavior Verbal commitment to aftercare and medication compliance  PRELIMINARY DISCHARGE PLAN: Outpatient therapy Medication management  PATIENT/FAMILY INVOLVEMENT: This treatment plan has been presented to and reviewed with the patient, John MuffJoshua Omar Copeland.  The patient and family have been given the opportunity to ask questions and make suggestions.  Levin BaconHeather V Bryah Ocheltree, RN 06/13/2017, 8:26 PM

## 2017-06-13 NOTE — ED Notes (Signed)
Patient was given Milk and [place] CloudGraham Crackers for [employer] Morgan EnergySnack.

## 2017-06-14 DIAGNOSIS — R456 Violent behavior: Secondary | ICD-10-CM

## 2017-06-14 DIAGNOSIS — F250 Schizoaffective disorder, bipolar type: Secondary | ICD-10-CM

## 2017-06-14 MED ORDER — PSEUDOEPHEDRINE HCL ER 120 MG PO TB12
120.0000 mg | ORAL_TABLET | Freq: Two times a day (BID) | ORAL | Status: DC | PRN
  Administered 2017-06-14 – 2017-06-23 (×11): 120 mg via ORAL
  Filled 2017-06-14 (×11): qty 1

## 2017-06-14 MED ORDER — OLANZAPINE 10 MG PO TBDP
ORAL_TABLET | ORAL | Status: AC
  Filled 2017-06-14: qty 1

## 2017-06-14 MED ORDER — ARIPIPRAZOLE 15 MG PO TABS
15.0000 mg | ORAL_TABLET | Freq: Every day | ORAL | Status: DC
  Administered 2017-06-14 – 2017-06-24 (×11): 15 mg via ORAL
  Filled 2017-06-14 (×14): qty 1

## 2017-06-14 MED ORDER — DIVALPROEX SODIUM 500 MG PO DR TAB
500.0000 mg | DELAYED_RELEASE_TABLET | Freq: Two times a day (BID) | ORAL | Status: DC
  Administered 2017-06-15 – 2017-06-24 (×18): 500 mg via ORAL
  Filled 2017-06-14 (×25): qty 1

## 2017-06-14 MED ORDER — ZIPRASIDONE MESYLATE 20 MG IM SOLR: 20.0000 mg | INTRAMUSCULAR | Status: DC | PRN

## 2017-06-14 MED ORDER — LORAZEPAM 1 MG PO TABS
1.0000 mg | ORAL_TABLET | ORAL | Status: AC | PRN
  Administered 2017-06-22: 1 mg via ORAL
  Filled 2017-06-14: qty 1

## 2017-06-14 MED ORDER — OLANZAPINE 10 MG PO TBDP
10.0000 mg | ORAL_TABLET | Freq: Three times a day (TID) | ORAL | Status: DC | PRN
  Administered 2017-06-14 – 2017-06-23 (×4): 10 mg via ORAL
  Filled 2017-06-14 (×2): qty 1
  Filled 2017-06-14: qty 2

## 2017-06-14 MED ORDER — TRAZODONE HCL 50 MG PO TABS
50.0000 mg | ORAL_TABLET | Freq: Every evening | ORAL | Status: DC | PRN
Start: 2017-06-14 — End: 2017-06-23
  Administered 2017-06-16 – 2017-06-22 (×10): 50 mg via ORAL
  Filled 2017-06-14 (×22): qty 1

## 2017-06-14 NOTE — Progress Notes (Signed)
Recreation Therapy Notes  INPATIENT RECREATION THERAPY ASSESSMENT  Patient Details Name: John Copeland MRN: [id number] DOB: 09/04/1990 Today's Date: 06/14/2017       Information Obtained From: Patient  Able to Participate in Assessment/Interview: Yes  Patient Presentation: Alert, Oriented, Groomed  Reason for Admission (Per Patient): Aggressive/Threatening  Patient Stressors: Other (Comment)(Finances)  Coping Skills:   Exercise, Talk, Prayer, Avoidance, Hot Bath/Shower  Leisure Interests (2+):  Exercise - Walking, Music - Listen  Frequency of Recreation/Participation: Other (Comment)(Daily)  Awareness of Community Resources:  Yes  Community Resources:  Restaurants  Current Use: Yes  Expressed Interest in [employer] Resource Information: Yes  Patient Main Form of Transportation: Therapist, musicublic Transportation  Patient Strengths:  Smart; Don't do drugs  Patient Identified Areas of Improvement:  Ignoring people; Learn to walk away  Current Recreation Participation:  Daily  Patient Goal for Hospitalization:  "Nothing, I guess get out"  [place]  [place] Residence[place]  Current SI (including self-harm):  No  Current HI:  No  Current AVH: No  Staff Intervention Plan: Group Attendance  Consent to Intern Participation: N/A   Caroll RancherMarjette Nichollas Perusse, LRT/CTRS  Caroll RancherLindsay, Daksh Coates A 06/14/2017, 2:24 PM

## 2017-06-14 NOTE — Progress Notes (Signed)
Recreation Therapy Notes  Date: 06/14/17 Time: 1000 Location: 500 Hall Dayroom  Group Topic: Communication, Team Building, Problem Solving  Goal Area(s) Addresses:  Patient will effectively work with peer towards shared goal.  Patient will identify skill used to make activity successful.  Patient will identify how skills used during activity can be used to reach post d/c goals.   Intervention: STEM Activity   Activity: [employer]. In teams, patients were asked to build the tallest freestanding tower possible out of 15 pipe cleaners. Systematically resources were removed, for example patient ability to use both hands and patient ability to verbally communicate.    Education: Pharmacist, communityocial Skills, Building control surveyorDischarge Planning.   Education Outcome: Acknowledges education/In group clarification offered/Needs additional education.   Clinical Observations/Feedback: Pt did not attend group.    John RancherMarjette Jahmier Copeland, LRT/CTRS         John AbedLindsay, John Copeland 06/14/2017 12:12 PM

## 2017-06-14 NOTE — H&P (Signed)
Psychiatric Admission Assessment Adult  Patient Identification: John MuffJoshua Omar Duer MRN:  [id number] Date of Evaluation:  06/14/2017 Chief Complaint:  SCHIZOPHRENIA CANNABIS USE DISORDER;SEVERE Principal Diagnosis: Schizophrenia (HCC) Diagnosis:   Patient Active Problem List   Diagnosis Date Noted  . Accidental drug overdose [T50.901A]   . Schizophrenia (HCC) [F20.9] 03/29/2017  . Polydipsia [R63.1] 01/12/2017  . Diphenhydramine overdose of undetermined intent [T45.0X4A] 01/10/2017  . Tobacco use disorder [F17.200] 10/07/2016  . Diabetes (HCC) [E11.9] 10/07/2016  . HTN (hypertension) [I10] 10/07/2016  . Dyslipidemia [E78.5] 10/07/2016  . Schizoaffective disorder, bipolar type (HCC) [F25.0] 11/30/2015   History of Present Illness:   Aleen SellsJoshua Sweeney is a 27 y/o M with history of schizoaffective disroder bipolar type who was admitted on IVC after brought to ED by his ACT team after he had been [facility]. Pt has recent relevant history of multiple presentations to ED's and inpatient psychiatry units over the past several months with more than 16 ED visits in the month of January 2019, and about 9 placements in group homes since June 2018. Pt has been dismissed multiple times for physical altercations and aggressive behavior. He was dismissed from his group home last on 05/28/17 and he has been homeless since that time. He has a guardian and an ACT team.  Upon initial evaluation, pt shares, "[facility] and someone was bothering me, so I got in a fight, and my ACT team brought me in." Pt denies SI/HI/AH/VH. He cites stressors of ongoing difficulty controlling his behaviors and maintaining medication adherence outside of a highly structured environment. Pt is sleeping adequately. He reports his mood is down and he is anxious, but he otherwise denies symptoms of mania, OCD, and PTSD. He denies illicit substance use aside from  cigarettes.  Discussed with patient about treatment options. He previous reports stability with depakote, and he is in agreement to resume depakote with addition of abilify. Discussed with patient about potential of transition to long-acting injectable form of abilify, and pt indicated he would open to discussing that option.  Associated Signs/Symptoms: Depression Symptoms:  depressed mood, anxiety, (Hypo) Manic Symptoms:  Distractibility, Impulsivity, Labiality of Mood, Anxiety Symptoms:  Excessive Worry, Psychotic Symptoms:  NA PTSD Symptoms: NA Total Time spent with patient: 1 hour  Past Psychiatric History:  - previous dx of schizoaffective disorder, bipolar - multiple inpatient stays with last admission to [facility] in 04/2017 - [facility] ACT team as outpatient team - denies hx of suicide attempt  Is the patient at risk to self? Yes.    Has the patient been a risk to self in the past 6 months? Yes.    Has the patient been a risk to self within the distant past? Yes.    Is the patient a risk to others? Yes.    Has the patient been a risk to others in the past 6 months? Yes.    Has the patient been a risk to others within the distant past? Yes.     Prior Inpatient Therapy:   Prior Outpatient Therapy:    Alcohol Screening: Patient refused Alcohol Screening Tool: Yes Substance Abuse History in the last 12 months:  No. Consequences of Substance Abuse: NA Previous Psychotropic Medications: Yes  Psychological Evaluations: Yes  Past Medical History:  Past Medical History:  Diagnosis Date  . Anxiety   . Depression   . Diabetes mellitus without complication (HCC)   . Homelessness   . Hypertension   .  Schizophrenia (HCC)    History reviewed. No pertinent surgical history. Family History:  Family History  Problem Relation Age of Onset  . Diabetes Other    Family Psychiatric  History: pt reports unknown family psychiatric history Tobacco Screening: Have you used any form of  tobacco in the last 30 days? (Cigarettes, Smokeless Tobacco, Cigars, and/or Pipes): Patient Refused Screening Social History:  Pt was born around [place] and raised in the [place] area. He lives on his own and he is currently homeless. He has completed 8th grade.  He has no spouse or children. 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 He denies legal and trauma history.  Social History   Substance and Sexual Activity  Alcohol Use No     Social History   Substance and Sexual Activity  Drug Use Yes  . Types: Marijuana   Comment: denies drug use    Additional Social History:                           Allergies:   Allergies  Allergen Reactions  . Haldol [Haloperidol Lactate] Other (See Comments)    Makes him feel slow   Lab Results:  Results for orders placed or performed during the hospital encounter of 06/10/17 (from the past 48 hour(s))  CBG monitoring, ED     Status: Abnormal   Collection Time: 06/13/17  4:38 PM  Result Value Ref Range   Glucose-Capillary 148 (H) 65 - 99 mg/dL    Blood Alcohol level:  Lab Results  Component Value Date   ETH <10 06/02/2017   ETH <10 05/20/2017    Metabolic Disorder Labs:  Lab Results  Component Value Date   HGBA1C 5.8 (H) 01/19/2017   MPG 119.76 01/19/2017   No results found for: PROLACTIN Lab Results  Component Value Date   CHOL 103 01/19/2017   TRIG 48 01/19/2017   HDL 36 (L) 01/19/2017   CHOLHDL 2.9 01/19/2017   VLDL 10 01/19/2017   LDLCALC 57 01/19/2017    Current Medications: Current Facility-Administered Medications  Medication Dose Route Frequency Provider Last Rate Last Dose  . acetaminophen (TYLENOL) tablet 650 mg  650 mg Oral Q6H PRN Nira Conn A, NP      . alum & mag hydroxide-simeth (MAALOX/MYLANTA) 200-200-20 MG/5ML suspension 30 mL  30 mL Oral Q4H PRN Nira Conn A, NP      . LORazepam (ATIVAN) injection 2 mg  2 mg Intramuscular Once Izediuno, Vincent A, MD      . OLANZapine zydis (ZYPREXA)  disintegrating tablet 10 mg  10 mg Oral Q8H PRN Micheal Likens, MD   10 mg at 06/14/17 1135   And  . LORazepam (ATIVAN) tablet 1 mg  1 mg Oral PRN Micheal Likens, MD       And  . ziprasidone (GEODON) injection 20 mg  20 mg Intramuscular PRN Micheal Likens, MD      . magnesium hydroxide (MILK OF MAGNESIA) suspension 30 mL  30 mL Oral Daily PRN Nira Conn A, NP      . OLANZapine zydis (ZYPREXA) 10 MG disintegrating tablet            PTA Medications: Medications Prior to Admission  Medication Sig Dispense Refill Last Dose  . benztropine (COGENTIN) 0.5 MG tablet Take 0.5 mg by mouth 2 (two) times daily.    unknown  . dextromethorphan-guaiFENesin (MUCINEX DM) 30-600 MG 12hr tablet Take 1 tablet by  mouth daily. (Patient not taking: Reported on 06/12/2017) 2 tablet 0 Not Taking at Unknown time  . divalproex (DEPAKOTE) 250 MG DR tablet Take 250 mg by mouth 2 (two) times daily.    unknown  . doxycycline (VIBRAMYCIN) 100 MG capsule Take 1 capsule (100 mg total) by mouth 2 (two) times daily. (Patient not taking: Reported on 06/12/2017) 20 capsule 0 Not Taking at Unknown time  . fluPHENAZine (PROLIXIN) 10 MG tablet Take 10 mg by mouth 2 (two) times daily.   unknown  . fluPHENAZine decanoate (PROLIXIN) 25 MG/ML injection Inject 50 mg into the muscle every 14 (fourteen) days.   unknown  . fluticasone (FLONASE) 50 MCG/ACT nasal spray Place 2 sprays into both nostrils daily. (Patient not taking: Reported on 06/12/2017) 9.9 g 2 Not Taking at Unknown time  . Guaifenesin 1200 MG TB12 Take 1 tablet (1,200 mg total) by mouth 2 (two) times daily. (Patient not taking: Reported on 06/12/2017) 20 each 0 Not Taking at Unknown time  . lisinopril (PRINIVIL,ZESTRIL) 10 MG tablet Take 10 mg by mouth daily.   unknown  . metFORMIN (GLUCOPHAGE) 500 MG tablet Take 1,000 mg by mouth 2 (two) times daily with a meal.   unknown  . OXcarbazepine (TRILEPTAL) 150 MG tablet Take 150 mg by mouth 2 (two) times  daily.   uknown  . promethazine-dextromethorphan (PROMETHAZINE-DM) 6.25-15 MG/5ML syrup Take 5 mLs by mouth 4 (four) times daily as needed for cough. (Patient not taking: Reported on 06/12/2017) 120 mL 0 Not Taking at Unknown time    Musculoskeletal: Strength & Muscle Tone: within normal limits Gait & Station: normal Patient leans: N/A  Psychiatric Specialty Exam: Physical Exam  Nursing note and vitals reviewed.   Review of Systems  Constitutional: Negative for chills and fever.  Respiratory: Negative for cough and shortness of breath.   Cardiovascular: Negative for chest pain.  Gastrointestinal: Negative for abdominal pain, heartburn, nausea and vomiting.  Psychiatric/Behavioral: Negative for depression, hallucinations and suicidal ideas. The patient is not nervous/anxious.     Blood pressure 127/79, pulse 79, temperature 97.9 F (36.6 C), temperature source Oral, resp. rate 18, height 5\' 11"  (1.803 m), weight 79.4 kg (175 lb).Body mass index is 24.41 kg/m.  General Appearance: Casual and Fairly Groomed  Eye Contact:  Good  Speech:  Clear and Coherent and Normal Rate  Volume:  Normal  Mood:  Anxious and Irritable  Affect:  Congruent and Constricted  Thought Process:  Coherent and Goal Directed  Orientation:  Full (Time, Place, and Person)  Thought Content:  Logical  Suicidal Thoughts:  No  Homicidal Thoughts:  No  Memory:  Immediate;   Fair Recent;   Fair Remote;   Fair  Judgement:  Impaired  Insight:  Lacking  Psychomotor Activity:  Normal  Concentration:  Concentration: Fair  Recall:  [employer] of Knowledge:  Fair  Language:  Fair  Akathisia:  No  Handed:    AIMS (if indicated):     Assets:  Communication Skills Desire for Improvement Resilience Social Support  ADL's:  Intact  Cognition:  WNL  Sleep:       Treatment Plan Summary: Daily contact with patient to assess and evaluate symptoms and progress in treatment and Medication management  Observation  Level/Precautions:  15 minute checks  Laboratory:  CBC Chemistry Profile HbAIC UDS  Psychotherapy:  Encourage participation in groups and therapeutic milieu  Medications:  Start depakote 500mg  BID, start abilify 15mg  po qDay  Consultations:    Discharge  Concerns:    Estimated LOS: 5-7 days  Other:     Physician Treatment Plan for Primary Diagnosis: Schizophrenia (HCC) Long Term Goal(s): Improvement in symptoms so as ready for discharge  Short Term Goals: Ability to identify changes in lifestyle to reduce recurrence of condition will improve  Physician Treatment Plan for Secondary Diagnosis: Principal Problem:   Schizophrenia (HCC)  Long Term Goal(s): Improvement in symptoms so as ready for discharge  Short Term Goals: Compliance with prescribed medications will improve  I certify that inpatient services furnished can reasonably be expected to improve the patient's condition.    Micheal Likens, MD 2/1/20191:03 PM

## 2017-06-14 NOTE — [facility] Suicide Risk Assessment (Signed)
[facility] INPATIENT:  Family/Significant Other Suicide Prevention Education  Suicide Prevention Education:  Education Completed; John Copeland [phone] (Legal Guardian)  has been identified by the patient as the family member/significant other with whom the patient will be residing, and identified as the person(s) who will aid the patient in the event of a mental health crisis (suicidal ideations/suicide attempt).  With written consent from the patient, the family member/significant other has been provided the following suicide prevention education, prior to the and/or following the discharge of the patient.  The suicide prevention education provided includes the following:  Suicide risk factors  Suicide prevention and interventions  National Suicide Hotline telephone number  [facility] assessment telephone number  [facility] Emergency Assistance 911  [facility] and/or Residential Mobile Crisis Unit telephone number  Request made of family/significant other to:  Remove weapons (e.g., guns, rifles, knives), all items previously/currently identified as safety concern.    Remove drugs/medications (over-the-counter, prescriptions, illicit drugs), all items previously/currently identified as a safety concern.  The family member/significant other verbalizes understanding of the suicide prevention education information provided.  The family member/significant other agrees to remove the items of safety concern listed above.  Legal guardian reports no previous SI or attempts.   John Copeland 06/14/2017, 9:58 AM
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
## 2017-06-14 NOTE — [facility] Group Notes (Signed)
[facility] LCSW Group Therapy 06/14/2017 1:15pm  Type of Therapy: Group Therapy- Feelings Around Discharge & Establishing a Supportive Framework  Participation Level:  Did Not Attend  Description of Group:   What is a supportive framework? What does it look like feel like and how do I discern it from and unhealthy non-supportive network? Learn how to cope when supports are not helpful and don't support you. Discuss what to do when your family/friends are not supportive.  Summary of Patient Progress   Therapeutic Modalities:   Cognitive Behavioral Therapy Person-Centered Therapy Motivational Interviewing   John Copeland [name], Student-Social Work 06/14/2017 2:08 PM     

## 2017-06-14 NOTE — Tx Team (Signed)
Interdisciplinary Treatment and Diagnostic Plan Update  06/14/2017 Time of Session: 2:34 PM  Gunner Iodice MRN: [id number]  Principal Diagnosis: Schizophrenia [facility])  Secondary Diagnoses: Principal Problem:   Schizophrenia (HCC)   Current Medications:  Current Facility-Administered Medications  Medication Dose Route Frequency Provider Last Rate Last Dose  . acetaminophen (TYLENOL) tablet 650 mg  650 mg Oral Q6H PRN Nira Conn A, NP      . alum & mag hydroxide-simeth (MAALOX/MYLANTA) 200-200-20 MG/5ML suspension 30 mL  30 mL Oral Q4H PRN Nira Conn A, NP      . LORazepam (ATIVAN) injection 2 mg  2 mg Intramuscular Once Izediuno, Vincent A, MD      . OLANZapine zydis (ZYPREXA) disintegrating tablet 10 mg  10 mg Oral Q8H PRN Micheal Likens, MD   10 mg at 06/14/17 1135   And  . LORazepam (ATIVAN) tablet 1 mg  1 mg Oral PRN Micheal Likens, MD       And  . ziprasidone (GEODON) injection 20 mg  20 mg Intramuscular PRN Micheal Likens, MD      . magnesium hydroxide (MILK OF MAGNESIA) suspension 30 mL  30 mL Oral Daily PRN Nira Conn A, NP      . OLANZapine zydis (ZYPREXA) 10 MG disintegrating tablet             PTA Medications: Medications Prior to Admission  Medication Sig Dispense Refill Last Dose  . benztropine (COGENTIN) 0.5 MG tablet Take 0.5 mg by mouth 2 (two) times daily.    unknown  . dextromethorphan-guaiFENesin (MUCINEX DM) 30-600 MG 12hr tablet Take 1 tablet by mouth daily. (Patient not taking: Reported on 06/12/2017) 2 tablet 0 Not Taking at Unknown time  . divalproex (DEPAKOTE) 250 MG DR tablet Take 250 mg by mouth 2 (two) times daily.    unknown  . doxycycline (VIBRAMYCIN) 100 MG capsule Take 1 capsule (100 mg total) by mouth 2 (two) times daily. (Patient not taking: Reported on 06/12/2017) 20 capsule 0 Not Taking at Unknown time  . fluPHENAZine (PROLIXIN) 10 MG tablet Take 10 mg by mouth 2 (two) times daily.   unknown  . fluPHENAZine  decanoate (PROLIXIN) 25 MG/ML injection Inject 50 mg into the muscle every 14 (fourteen) days.   unknown  . fluticasone (FLONASE) 50 MCG/ACT nasal spray Place 2 sprays into both nostrils daily. (Patient not taking: Reported on 06/12/2017) 9.9 g 2 Not Taking at Unknown time  . Guaifenesin 1200 MG TB12 Take 1 tablet (1,200 mg total) by mouth 2 (two) times daily. (Patient not taking: Reported on 06/12/2017) 20 each 0 Not Taking at Unknown time  . lisinopril (PRINIVIL,ZESTRIL) 10 MG tablet Take 10 mg by mouth daily.   unknown  . metFORMIN (GLUCOPHAGE) 500 MG tablet Take 1,000 mg by mouth 2 (two) times daily with a meal.   unknown  . OXcarbazepine (TRILEPTAL) 150 MG tablet Take 150 mg by mouth 2 (two) times daily.   uknown  . promethazine-dextromethorphan (PROMETHAZINE-DM) 6.25-15 MG/5ML syrup Take 5 mLs by mouth 4 (four) times daily as needed for cough. (Patient not taking: Reported on 06/12/2017) 120 mL 0 Not Taking at Unknown time    Treatment Modalities: Medication Management, Group therapy, Case management,  1 to 1 session with clinician, Psychoeducation, Recreational therapy.  Patient Stressors: Financial difficulties Medication change or noncompliance  Patient Strengths: General fund of knowledge Physical Health   Physician Treatment Plan for Primary Diagnosis: Schizophrenia (HCC) Long Term Goal(s): Improvement in symptoms so  as ready for discharge  Short Term Goals:    Medication Management: Evaluate patient's response, side effects, and tolerance of medication regimen.  Therapeutic Interventions: 1 to 1 sessions, Unit Group sessions and Medication administration.  Evaluation of Outcomes: Progressing  Physician Treatment Plan for Secondary Diagnosis: Principal Problem:   Schizophrenia (HCC)  Long Term Goal(s): Improvement in symptoms so as ready for discharge  Short Term Goals:    Medication Management: Evaluate patient's response, side effects, and tolerance of medication  regimen.  Therapeutic Interventions: 1 to 1 sessions, Unit Group sessions and Medication administration.  Evaluation of Outcomes: Progressing   RN Treatment Plan for Primary Diagnosis: Schizophrenia (HCC) Long Term Goal(s): Knowledge of disease and therapeutic regimen to maintain health will improve  Short Term Goals: Ability to verbalize frustration and anger appropriately will improve, Ability to demonstrate self-control, Ability to identify and develop effective coping behaviors will improve and Compliance with prescribed medications will improve  Medication Management: RN will administer medications as ordered by provider, will assess and evaluate patient's response and provide education to patient for prescribed medication. RN will report any adverse and/or side effects to prescribing provider.  Therapeutic Interventions: 1 on 1 counseling sessions, Psychoeducation, Medication administration, Evaluate responses to treatment, Monitor vital signs and CBGs as ordered, Perform/monitor CIWA, COWS, AIMS and Fall Risk screenings as ordered, Perform wound care treatments as ordered.  Evaluation of Outcomes: Progressing   LCSW Treatment Plan for Primary Diagnosis: Schizophrenia (HCC) Long Term Goal(s): Safe transition to appropriate next level of care at discharge, Engage patient in therapeutic group addressing interpersonal concerns.  Short Term Goals: Engage patient in aftercare planning with referrals and resources, Increase emotional regulation, Facilitate acceptance of mental health diagnosis and concerns, Identify triggers associated with mental health/substance abuse issues and Increase skills for wellness and recovery  Therapeutic Interventions: Assess for all discharge needs, 1 to 1 time with Social worker, Explore available resources and support systems, Assess for adequacy in community support network, Educate family and significant other(s) on suicide prevention, Complete Psychosocial  Assessment, Interpersonal group therapy.  Evaluation of Outcomes: Progressing   Progress in Treatment: Attending groups: Yes Participating in groups: Yes Taking medication as prescribed: Yes Toleration of medication: Yes, no side effects reported at this time Family/Significant other contact made: Legal [place] ServiceGuardian, [name] Gash [phone] Patient understands diagnosis: No, limited insight Discussing patient identified problems/goals with staff: Yes Medical problems stabilized or resolved: Yes Denies suicidal/homicidal ideation: Yes Issues/concerns per patient self-inventory: None Other: N/A  New problem(s) identified: None identified at this time.   New Short Term/Long Term Goal(s): "Unable to identify any goals"   Discharge Plan or Barriers: Pt will be placed on [facility] waitlist   Reason for Continuation of Hospitalization: Hallucinations Homicidal ideation Medical Issues Medication stabilization   Estimated Length of Stay: 06/19/2017  Attendees: Patient: Aleen SellsJoshua Hopson  06/14/2017  2:34 PM  Physician: Jolyne Loahristopher Rainville, MD 06/14/2017  2:34 PM  Nursing: Estella HuskElizabeth Awofabeju, RN 06/14/2017  2:34 PM  RN Care Manager: Onnie BoerJennifer Clark, RN 06/14/2017  2:34 PM  Social Worker: Richelle Itood North, LCSW; Melba CoonAngel Kaylianna Detert, Social Work Intern 06/14/2017  2:34 PM  Recreational Therapist: Caroll RancherMarjette [name], LRT 06/14/2017  2:34 PM  Other: Tomasita Morrowelora Sutton, P4CC 06/14/2017  2:34 PM  Other:  06/14/2017  2:34 PM  Other: 06/14/2017  2:34 PM    Scribe for Treatment Team: Aram BeechamAngel M Melitta Tigue, Student-Social Work 06/14/2017 2:34 PM
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
## 2017-06-14 NOTE — Progress Notes (Signed)
D: Pt forwarded little information, pt would only answer questions by nodding / shaking head.  A: Pt was offered support and encouragement. Pt was given scheduled medications. Pt was encourage to attend groups. Q 15 minute checks were done for safety.   R: safety maintained on unit.

## 2017-06-15 DIAGNOSIS — F1290 Cannabis use, unspecified, uncomplicated: Secondary | ICD-10-CM

## 2017-06-15 DIAGNOSIS — F39 Unspecified mood [affective] disorder: Secondary | ICD-10-CM

## 2017-06-15 DIAGNOSIS — Z590 Homelessness: Secondary | ICD-10-CM

## 2017-06-15 DIAGNOSIS — F17210 Nicotine dependence, cigarettes, uncomplicated: Secondary | ICD-10-CM

## 2017-06-15 DIAGNOSIS — R451 Restlessness and agitation: Secondary | ICD-10-CM

## 2017-06-15 DIAGNOSIS — F200 Paranoid schizophrenia: Secondary | ICD-10-CM

## 2017-06-15 DIAGNOSIS — G4700 Insomnia, unspecified: Secondary | ICD-10-CM

## 2017-06-15 NOTE — [facility] Group Notes (Signed)
[facility] Group Notes: (Clinical Social Work)   06/15/2017      Type of Therapy:  Group Therapy   Participation Level:  Did Not Attend despite MHT prompting   Maaz Spiering Grossman-Orr, LCSW 06/15/2017, 12:20 PM     

## 2017-06-15 NOTE — [facility] Group Notes (Signed)
[facility] Group Notes:  (Nursing/MHT/Case Management/Adjunct)  Date:  06/15/2017  Time:  2:26 PM  Type of Therapy:  Psychoeducational Skills  Participation Level:  Did Not Attend  Participation Quality:  Did Not Attend  Affect:  Did Not Attend  Cognitive:  Did Not Attend  Insight:  None  Engagement in Group:  Did Not Attend  Modes of Intervention:  Did Not Attend  Summary of Progress/Problems: Pt did not attend group topic being assertive.   Saketh Daubert Shanta 06/15/2017, 2:26 PM 

## 2017-06-15 NOTE — [facility] Group Notes (Signed)
[facility] Group Notes:  (Nursing/MHT/Case Management/Adjunct)  Date:  06/15/2017  Time:  12:25 PM  Type of Therapy:  Psychoeducational Skills  Participation Level:  Did Not Attend  Participation Quality:  Did Not Attend  Affect:  Did Not Attend  Cognitive:  Did Not Attend  Insight:  None  Engagement in Group:  Did Not Attend  Modes of Intervention:  Did Not Attend  Summary of Progress/Problems: Pt did not attend anger management group.   Jacquelyne BalintForrest, Letricia Krinsky Shanta 06/15/2017, 12:25 PM

## 2017-06-15 NOTE — [facility] Group Notes (Signed)
[facility] Group Notes:  (Nursing/MHT/Case Management/Adjunct)  Date:  06/15/2017  Time:  11:50 AM  Type of Therapy:  Patient self inventory group and goals group  Participation Level:  Did Not Attend  Participation Quality:  Did Not Attend  Affect:  Did Not Attend  Cognitive:  Did Not Attend  Insight:  None  Engagement in Group:  Did Not Attend  Modes of Intervention:  Did Not Attend  Summary of Progress/Problems: Pt did not attend patient self inventory group and goals group.    Jacquelyne BalintForrest, Christepher Melchior Shanta 06/15/2017, 11:50 AM

## 2017-06-15 NOTE — Progress Notes (Signed)
Did not attend group 

## 2017-06-15 NOTE — Progress Notes (Signed)
Patient ID: John MuffJoshua Omar Kropf, male   DOB: 12/13/1990, 27 y.o.   MRN: [id number]    D: Pt has been very guarded and isolative on the unit today. Pt remained in his room most of the day, he did not attend any groups nor did he engage in treatment. This afternoon the patient did request to come off the unit restriction, Aggie NP was made aware she reported that he was going to remain on the unit restriction because he would not talk to her this morning. Aggie NP reported that she would reassess in the morning. Pt reported that he was not depressed, not hopeless, or anxious. Pt reported that he was just hungry and wanted to go off unit to eat. Pt reported being negative SI/HI, no AH/VH noted. A: 15 min checks continued for patient safety. R: Pt safety maintained.
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
## 2017-06-16 NOTE — Progress Notes (Addendum)
Patient ID: John Copeland, male   DOB: August 18, 1990, 27 y.o.   MRN: [id number] DAR Note: Pt in bed all evening with eyes closed. Pt does not look to be in any distress at the time of assessment. Support, encouragement, and safe environment provided. 15-minute safety checks continue. Safety checks continue. Pt did not attend wrap-up group.

## 2017-06-16 NOTE — Progress Notes (Signed)
Patient ID: John Copeland, male   DOB: April 27, 1991, 27 y.o.   MRN: [id number]   D: Pt has been very guarded and isolative on the unit today. Pt remained in his room most of the day, he did not attend any groups nor did he engage in treatment. Pt reported that he was not depressed, not hopeless, or anxious. Pt reported that he was just hungry and wanted to go off unit to eat. Pt reported being negative SI/HI, no AH/VH noted. A: 15 min checks continued for patient safety. R: Pt safety maintained.

## 2017-06-16 NOTE — [facility] Group Notes (Signed)
[facility] Group Notes:  (Nursing/MHT/Case Management/Adjunct)  Date:  06/16/2017  Time:  9:21 AM  Type of Therapy:  Orientation/Goals group  Participation Level:  Did Not Attend  Participation Quality:  Did Not Attend  Affect:  Did Not Attend  Cognitive:  Did Not Attend  Insight:  None  Engagement in Group:  Did Not Attend  Modes of Intervention:  Did Not Attend  Summary of Progress/Problems: Pt did not attend patient self inventory group/orientation group.   Jacquelyne BalintForrest, Larysa Pall Shanta 06/16/2017, 9:21 AM
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
## 2017-06-16 NOTE — [facility] Group Notes (Signed)
[facility] Group Notes:  (Nursing/MHT/Case Management/Adjunct)  Date:  06/16/2017  Time:  10:40 AM  Type of Therapy:  Psychoeducational Skills  Participation Level:  Did Not Attend  Participation Quality:  Did not attend  Affect:  Did not attend  Cognitive:  Did not attend  Insight:  None  Engagement in Group:  Did not attend  Modes of Intervention:  Did not attend  Summary of Progress/Problems: Pt did not attend Psychoeducational group with topic healthy support systems.   Jacquelyne BalintForrest, Willer Osorno Shanta 06/16/2017, 10:40 AM

## 2017-06-16 NOTE — [facility] Group Notes (Signed)
[facility] Group Notes:  (Nursing/MHT/Case Management/Adjunct)  Date:  06/16/2017  Time:  2:32 PM  Type of Therapy:  Psychoeducational Skills  Participation Level:  Did Not Attend  Participation Quality:  Did not attend  Affect:  Did not attend  Cognitive:  Did not attend  Insight:  None  Engagement in Group:  Did not attend  Modes of Intervention:  Did not attend  Summary of Progress/Problems: Pt did not attend Psychoeducational group with topic love languages.   Belinda Bringhurst Shanta 06/16/2017, 2:32 PM 

## 2017-06-16 NOTE — [facility] Group Notes (Signed)
[facility] Group Notes: (Clinical Social Work)   06/16/2017      Type of Therapy:  Group Therapy   Participation Level:  Did Not Attend despite MHT prompting   Ambrose MantleMareida Grossman-Orr, LCSW 06/16/2017, 12:11 PM

## 2017-06-17 NOTE — Progress Notes (Signed)
DAR NOTE: Patient presents with irritable affect and mood.  Denies suicidal thoughts, auditory and visual hallucinations.  Rates depression at 0, hopelessness at 0, and anxiety at 0.  Maintained on routine safety checks.  Medications given as prescribed.  Support and encouragement offered as needed.  Attended group and participated.  States goal for today is "finding placement after discharge."  Patient visible in milieu with minimal interaction with staff and peers.

## 2017-06-17 NOTE — Progress Notes (Signed)
Patient ID: John MuffJoshua Omar Copeland, male   DOB: May 30, 1990, 27 y.o.   MRN: [id number]  DAR Note: Pt continue to be isolative and guarded unit; only comes out for medications. Pt appears to be suspicious however, denied AVH. Pt also denied pain, depression, anxiety, SI or HI. Pt does not look to be in any acute distress. Support, encouragement, and safe environment provided. 15-minute safety checks continues at this time. Pt was med compliant.
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
## 2017-06-17 NOTE — Progress Notes (Signed)
Recreation Therapy Notes  Date: 06/17/17 Time: 1000 Location: 500 Hall Dayroom  Group Topic: Coping Skills  Goal Area(s) Addresses:  Patient will be able to identify positive coping skills. Patient will be able to identify benefits of positive coping skills. Patient will be able to identify benefits of using coping skills post d/c.  Behavioral Response: None  Intervention: Mind map, pencils  Activity: Coping skills Mind map.  LRT and patients filled in the first 8 boxes of the mind map together with anxiety, depression, sleep deprivation, finances, relationships, irritated, suicidal and anger.  Patients were to indivdually come up with 3 coping skills for each area addressed.  The group would reconvene and LRT would write the coping skills on the board.  Education: PharmacologistCoping Skills, Building control surveyorDischarge Planning.   Education Outcome: Acknowledges understanding/In group clarification offered/Needs additional education.   Clinical Observations/Feedback: Pt sat and listened but did not participate.    Caroll RancherMarjette Enis Riecke, LRT/CTRS    Caroll RancherLindsay, Erlean Mealor A 06/17/2017 12:53 PM

## 2017-06-17 NOTE — Progress Notes (Signed)
Psychoeducational Group Note  Date:  06/17/2017 Time:  2124  Group Topic/Focus:  Wrap-Up Group:   The focus of this group is to help patients review their daily goal of treatment and discuss progress on daily workbooks.  Participation Level: Did Not Attend  Participation Quality:  Not Applicable  Affect:  Not Applicable  Cognitive:  Not Applicable  Insight:  Not Applicable  Engagement in Group: Not Applicable  Additional Comments:  The patient did not attend group this evening.   Hazle CocaGOODMAN, Iori Gigante S 06/17/2017, 9:24 PM

## 2017-06-17 NOTE — [facility] Group Notes (Signed)
[facility]  06/17/2017 , 2:08 PM    Type of Therapy:  [employer] Presentation  Participation Level:  Active  Participation Quality:  Attentive  Affect:  Blunted  Cognitive:  Oriented  Insight:  Limited  Engagement in Therapy:  Engaged  Modes of Intervention:  Discussion, Education and Socialization  Summary of Progress/Problems:  Tammi  from [employer] came to present her recovery story, encourage group  members to share something about their story, and present information about the MHA.  Invited.  Chose to not attend.  Daryel Geraldorth, Callahan Wild B 06/17/2017 , 2:08 PM

## 2017-06-18 NOTE — BHH Group Notes (Signed)
LCSW Group Therapy Note   06/18/2017 1:15pm   Type of Therapy and Topic:  Group Therapy:  Overcoming Obstacles   Participation Level:  Did Not Attend   Description of Group:    In this group patients will be encouraged to explore what they see as obstacles to their own wellness and recovery. They will be guided to discuss their thoughts, feelings, and behaviors related to these obstacles. The group will process together ways to cope with barriers, with attention given to specific choices patients can make. Each patient will be challenged to identify changes they are motivated to make in order to overcome their obstacles. This group will be process-oriented, with patients participating in exploration of their own experiences as well as giving and receiving support and challenge from other group members.   Therapeutic Goals: 1. Patient will identify personal and current obstacles as they relate to admission. 2. Patient will identify barriers that currently interfere with their wellness or overcoming obstacles.  3. Patient will identify feelings, thought process and behaviors related to these barriers. 4. Patient will identify two changes they are willing to make to overcome these obstacles:      Summary of Patient Progress      Therapeutic Modalities:   Cognitive Behavioral Therapy Solution Focused Therapy Motivational Interviewing Relapse Prevention Therapy  John RogueRodney B Francy Mcilvaine, LCSW 06/18/2017 2:17 PM

## 2017-06-18 NOTE — Progress Notes (Signed)
Dar Note: Patient presents with anxious affect and mood.  Denies suicidal thoughts, auditory and visual hallucinations.  Medications given as prescribed.  Routine safety checks maintained every 15 minutes.  Support and encouragement offered as needed.  Patient is concerned and preoccupied with discharge.  Remained in his room most of this shift.  Patient is safe on the unit.

## 2017-06-18 NOTE — Progress Notes (Signed)
Pt was invited to group but did not attend.
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
## 2017-06-18 NOTE — B[facility] Counselor (Signed)
Attempted to engage patient in conversqtion about d/c plan.  S[facility].  [facility]  When I told him that wasn't an option, [facility]  I told him he could not get in there directly.  I suggested we call his guardian together.  He declined, saying the meds are making him sleepy, and that when he tried to call yesterday, the number was disconnected.
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
## 2017-06-18 NOTE — Progress Notes (Signed)
Recreation Therapy Notes  Date: 06/18/17 Time: 0945 Location: 500 Hall Dayroom  Group Topic: Anxiety  Goal Area(s) Addresses:  Patient will identify triggers for anxiety.  Patient will identify physical reaction to anxiety.   Patient will identify coping skills for anxiety.  Intervention: Worksheet  Activity: Introduction to Anxiety.  Patients were to identify their triggers to anxiety, the physical symptoms they experience from anxiety, the thoughts they have with anxious and three coping skills to deal with anxiety.  Education: Anxiety, Discharge Planning   Education Outcome: Acknowledges education/In group clarification offered/Needs additional education.   Clinical Observations/Feedback: Pt did not attend group.   Caroll RancherMarjette Bharat Antillon, LRT/CTRS     Lillia AbedLindsay, Cameo Schmiesing A 06/18/2017 11:23 AM

## 2017-06-18 NOTE — [facility] Group Notes (Signed)
[facility] Group Notes:  (Nursing/MHT/Case Management/Adjunct)  Date:  06/18/2017  Time:  5:38 PM  Type of Therapy:  Psychoeducational Skills  Participation Level:  Active  Participation Quality:  Appropriate  Affect:  Appropriate  Cognitive:  Alert and Appropriate  Insight:  Appropriate  Engagement in Group:  Lacking  Modes of Intervention:  Discussion  Summary of Progress/Problems:Component and benefits of mindfulness discussed.  Patient was attentive and participated.   John Copeland 06/18/2017, 5:38 PM

## 2017-06-18 NOTE — Progress Notes (Signed)
Patient ID: John Copeland, male   DOB: 10/24/1990, 26 y.o.   MRN: [id number] DAR Note: Pt continue to be guarded and isolative to his room; only comes out for medications. Pt make minimal interactions. Pt denied AVH, pain, depression, anxiety, SI or HI. Pt does not look to be in any acute distress. Support, encouragement, and safe environment provided. 15-minute safety checks continues at this time. Pt was med compliant.  

## 2017-06-19 NOTE — [facility] Group Notes (Signed)
LCSW Group Therapy Note   06/19/2017 1:15pm   Type of Therapy and Topic:  Group Therapy:  Trust and Honesty  Participation Level:  Did Not Attend  Description of Group:    In this group patients will be asked to explore the value of being honest.  Patients will be guided to discuss their thoughts, feelings, and behaviors related to honesty and trusting in others. Patients will process together how trust and honesty relate to forming relationships with peers, family members, and self. Each patient will be challenged to identify and express feelings of being vulnerable. Patients will discuss reasons why people are dishonest and identify alternative outcomes if one was truthful (to self or others). This group will be process-oriented, with patients participating in exploration of their own experiences, giving and receiving support, and processing challenge from other group members.   Therapeutic Goals: 1. Patient will identify why honesty is important to relationships and how honesty overall affects relationships.  2. Patient will identify a situation where they lied or were lied too and the  feelings, thought process, and behaviors surrounding the situation 3. Patient will identify the meaning of being vulnerable, how that feels, and how that correlates to being honest with self and others. 4. Patient will identify situations where they could have told the truth, but instead lied and explain reasons of dishonesty.   Summary of Patient Progress    Therapeutic Modalities:   Cognitive Behavioral Therapy Solution Focused Therapy Motivational Interviewing Brief Therapy  Ida RogueRodney B Joss Mcdill, LCSW 06/19/2017 1:25 PM

## 2017-06-19 NOTE — [facility] Group Notes (Signed)
[facility] Group Notes:  (Nursing/MHT/Case Management/Adjunct)  Date:  06/19/2017  Time:  1600  Type of Therapy:  Nurse Education - Identifying Needs to Promote Wellness  Participation Level:  Did Not Attend  Participation Quality:    Affect:    Cognitive:    Insight:    Engagement in Group:    Modes of Intervention:    Summary of Progress/Problems: Patient invited to group however would not wake for this Clinical research associatewriter. Appeared to be awake however would not open his eyes.  Lawrence MarseillesFriedman, Ngozi Alvidrez Eakes 06/19/2017, 4:54 PM
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
## 2017-06-19 NOTE — Progress Notes (Signed)
Recreation Therapy Notes  Date: 06/19/17 Time: 1000 Location: 500 Hall Dayroom  Group Topic: Self-Esteem  Goal Area(s) Addresses:  Patient will successfully identify positive attributes about themselves.  Patient will successfully identify benefit of improved self-esteem.   Intervention: Worksheet, pencils   Activity: My Strengths and Qualities.  Patients were given a worksheet in which they were to come up with three answers for the following categories:  Things I'm good at, compliments I've received, what I like about my appearance, challenges I've overcome, I've helped others by, things that make me unique, what I value the most and times I've made others happy.  Education:  Self-Esteem, Building control surveyorDischarge Planning.   Education Outcome: Acknowledges education/In group clarification offered/Needs additional education  Clinical Observations/Feedback: Pt did not attend group.   Caroll RancherMarjette Nekesha Font, LRT/CTRS         Caroll RancherLindsay, Tamer Baughman A 06/19/2017 12:03 PM

## 2017-06-19 NOTE — Progress Notes (Signed)
Patient ID: John Copeland, male   DOB: April 23, 1991, 27 y.o.   MRN: [id number] DAR Note: Pt continue to be guarded and isolative to his room; only comes out for medications. Pt make minimal interactions. Pt denied AVH, pain, depression, anxiety, SI or HI. Pt does not look to be in any acute distress. Support, encouragement, and safe environment provided. 15-minute safety checks continues at this time. Pt was med compliant.

## 2017-06-19 NOTE — Tx Team (Signed)
Interdisciplinary Treatment and Diagnostic Plan Update  06/19/2017 Time of Session: 8:32 AM  John Copeland MRN: [id number]  Principal Diagnosis: Schizophrenia [facility])  Secondary Diagnoses: Principal Problem:   Schizophrenia (HCC)   Current Medications:  Current Facility-Administered Medications  Medication Dose Route Frequency Provider Last Rate Last Dose  . acetaminophen (TYLENOL) tablet 650 mg  650 mg Oral Q6H PRN Nira Conn A, NP   650 mg at 06/17/17 0915  . alum & mag hydroxide-simeth (MAALOX/MYLANTA) 200-200-20 MG/5ML suspension 30 mL  30 mL Oral Q4H PRN Nira Conn A, NP      . ARIPiprazole (ABILIFY) tablet 15 mg  15 mg Oral Daily Micheal Likens, MD   15 mg at 06/19/17 0814  . divalproex (DEPAKOTE) DR tablet 500 mg  500 mg Oral Q12H Micheal Likens, MD   500 mg at 06/19/17 0814  . LORazepam (ATIVAN) injection 2 mg  2 mg Intramuscular Once Izediuno, Vincent A, MD      . OLANZapine zydis (ZYPREXA) disintegrating tablet 10 mg  10 mg Oral Q8H PRN Micheal Likens, MD   10 mg at 06/17/17 2323   And  . LORazepam (ATIVAN) tablet 1 mg  1 mg Oral PRN Micheal Likens, MD       And  . ziprasidone (GEODON) injection 20 mg  20 mg Intramuscular PRN Micheal Likens, MD      . magnesium hydroxide (MILK OF MAGNESIA) suspension 30 mL  30 mL Oral Daily PRN Nira Conn A, NP      . pseudoephedrine (SUDAFED) 12 hr tablet 120 mg  120 mg Oral Q12H PRN Micheal Likens, MD   120 mg at 06/16/17 1256  . traZODone (DESYREL) tablet 50 mg  50 mg Oral QHS,MR X 1 Nira Conn A, NP   50 mg at 06/18/17 2121    PTA Medications: Medications Prior to Admission  Medication Sig Dispense Refill Last Dose  . benztropine (COGENTIN) 0.5 MG tablet Take 0.5 mg by mouth 2 (two) times daily.    unknown  . dextromethorphan-guaiFENesin (MUCINEX DM) 30-600 MG 12hr tablet Take 1 tablet by mouth daily. (Patient not taking: Reported on 06/12/2017) 2 tablet 0 Not Taking  at Unknown time  . divalproex (DEPAKOTE) 250 MG DR tablet Take 250 mg by mouth 2 (two) times daily.    unknown  . doxycycline (VIBRAMYCIN) 100 MG capsule Take 1 capsule (100 mg total) by mouth 2 (two) times daily. (Patient not taking: Reported on 06/12/2017) 20 capsule 0 Not Taking at Unknown time  . fluPHENAZine (PROLIXIN) 10 MG tablet Take 10 mg by mouth 2 (two) times daily.   unknown  . fluPHENAZine decanoate (PROLIXIN) 25 MG/ML injection Inject 50 mg into the muscle every 14 (fourteen) days.   unknown  . fluticasone (FLONASE) 50 MCG/ACT nasal spray Place 2 sprays into both nostrils daily. (Patient not taking: Reported on 06/12/2017) 9.9 g 2 Not Taking at Unknown time  . Guaifenesin 1200 MG TB12 Take 1 tablet (1,200 mg total) by mouth 2 (two) times daily. (Patient not taking: Reported on 06/12/2017) 20 each 0 Not Taking at Unknown time  . lisinopril (PRINIVIL,ZESTRIL) 10 MG tablet Take 10 mg by mouth daily.   unknown  . metFORMIN (GLUCOPHAGE) 500 MG tablet Take 1,000 mg by mouth 2 (two) times daily with a meal.   unknown  . OXcarbazepine (TRILEPTAL) 150 MG tablet Take 150 mg by mouth 2 (two) times daily.   uknown  . promethazine-dextromethorphan (PROMETHAZINE-DM) 6.25-15 MG/5ML syrup  Take 5 mLs by mouth 4 (four) times daily as needed for cough. (Patient not taking: Reported on 06/12/2017) 120 mL 0 Not Taking at Unknown time    Treatment Modalities: Medication Management, Group therapy, Case management,  1 to 1 session with clinician, Psychoeducation, Recreational therapy.  Patient Stressors: Financial difficulties Medication change or noncompliance  Patient Strengths: Lawyer of knowledge Physical Health   Physician Treatment Plan for Primary Diagnosis: Schizophrenia (HCC) Long Term Goal(s): Improvement in symptoms so as ready for discharge  Short Term Goals: Ability to identify changes in lifestyle to reduce recurrence of condition will improve Compliance with prescribed medications  will improve  Medication Management: Evaluate patient's response, side effects, and tolerance of medication regimen.  Therapeutic Interventions: 1 to 1 sessions, Unit Group sessions and Medication administration.  Evaluation of Outcomes: Adequate for Discharge   2/6:Pt demonstrates improvement of his presenting mood symptoms, and he is tolerating his current regimen without difficulty or side effects. He will work with SW team and his guardian about arranging shelter options for after discharge.  -Continue inpatient hospitalization  -Schizoaffective disorder, bipolar type - Continue Abilify 15 mg po daily. - Depakote DR 500 mg poq12h (level on 06/20/17 AM)  -Agitation/psychosis. - Will continue the agitation protocol recommendation.  -Insomnia. - Continue Trazodone 50 mg po Q hs prn, may repeat x 1.    Physician Treatment Plan for Secondary Diagnosis: Principal Problem:   Schizophrenia (HCC)  Long Term Goal(s): Improvement in symptoms so as ready for discharge  Short Term Goals: Ability to identify changes in lifestyle to reduce recurrence of condition will improve Compliance with prescribed medications will improve  Medication Management: Evaluate patient's response, side effects, and tolerance of medication regimen.  Therapeutic Interventions: 1 to 1 sessions, Unit Group sessions and Medication administration.  Evaluation of Outcomes: Adequate for Discharge   RN Treatment Plan for Primary Diagnosis: Schizophrenia (HCC) Long Term Goal(s): Knowledge of disease and therapeutic regimen to maintain health will improve  Short Term Goals: Ability to verbalize frustration and anger appropriately will improve, Ability to demonstrate self-control, Ability to identify and develop effective coping behaviors will improve and Compliance with prescribed medications will improve  Medication Management: RN will administer medications as ordered by provider, will  assess and evaluate patient's response and provide education to patient for prescribed medication. RN will report any adverse and/or side effects to prescribing provider.  Therapeutic Interventions: 1 on 1 counseling sessions, Psychoeducation, Medication administration, Evaluate responses to treatment, Monitor vital signs and CBGs as ordered, Perform/monitor CIWA, COWS, AIMS and Fall Risk screenings as ordered, Perform wound care treatments as ordered.  Evaluation of Outcomes: Adequate for Discharge   LCSW Treatment Plan for Primary Diagnosis: Schizophrenia [facility]) Long Term Goal(s): Safe transition to appropriate next level of care at discharge, Engage patient in therapeutic group addressing interpersonal concerns.  Short Term Goals: Engage patient in aftercare planning with referrals and resources, Increase emotional regulation, Facilitate acceptance of mental health diagnosis and concerns, Identify triggers associated with mental health/substance abuse issues and Increase skills for wellness and recovery  Therapeutic Interventions: Assess for all discharge needs, 1 to 1 time with Social worker, Explore available resources and support systems, Assess for adequacy in community support network, Educate family and significant other(s) on suicide prevention, Complete Psychosocial Assessment, Interpersonal group therapy.  Evaluation of Outcomes: Adequate for Discharge  Will need to contact ACT team to coordinate d/c plan.  Unless guardian finds GH placement, will plan to d/c to shelter   Progress  in Treatment: Attending groups: No Participating in groups: No Taking medication as prescribed: Yes Toleration of medication: Yes, no side effects reported at this time Family/Significant other contact made: Legal [place] ServiceGuardian, [name] Gash [phone] Patient understands diagnosis: No, limited insight Discussing patient identified problems/goals with staff: Yes Medical problems stabilized or resolved:  Yes Denies suicidal/homicidal ideation: Yes Issues/concerns per patient self-inventory: None Other: N/A  New problem(s) identified: None identified at this time.   New Short Term/Long Term Goal(s): "Unable to identify any goals"   Discharge Plan or Barriers:    Reason for Continuation of Hospitalization: Hallucinations Medication stabilization   Estimated Length of Stay: Likely d/c Friday  Attendees: Patient:  06/19/2017  8:32 AM  Physician: Jolyne Loahristopher Rainville, MD 06/19/2017  8:32 AM  Nursing: Estella HuskElizabeth Awofabeju, RN 06/19/2017  8:32 AM  RN Care Manager: Onnie BoerJennifer Clark, RN 06/19/2017  8:32 AM  Social Worker: Richelle Itood Elayah Klooster, LCSW; Melba CoonAngel Webster, Social Work Intern 06/19/2017  8:32 AM  Recreational Therapist: Caroll RancherMarjette [name], LRT 06/19/2017  8:32 AM  Other: Tomasita Morrowelora Sutton, P4CC 06/19/2017  8:32 AM  Other:  06/19/2017  8:32 AM  Other: 06/19/2017  8:32 AM    Scribe for Treatment Team: Ida Rogueodney B Braylinn Gulden, LCSW 06/19/2017 8:32 AM

## 2017-06-19 NOTE — Progress Notes (Signed)
Adult Psychoeducational Group Note  Date:  06/19/2017 Time:  11:39 PM  Group Topic/Focus:  Wrap-Up Group:   The focus of this group is to help patients review their daily goal of treatment and discuss progress on daily workbooks.  Participation Level:  None  Participation Quality:  Resistant  Affect:  Resistant  Cognitive:  Alert and Oriented  Insight: None  Engagement in Group:  None  Modes of Intervention:  Discussion  Additional Comments:  Pt attended group, but did not share or take part in any discussions.  Leo GrosserMegan A Velma Hanna 06/19/2017, 11:39 PM

## 2017-06-19 NOTE — Progress Notes (Signed)
D: Patient observed resting in bed this morning. Reluctant to engage with this writer due to sleepiness. Minimal interaction forwarded. Later in morning patient came up to nurses' station and intrusively asked for medications "for my cold." Patient reluctant to wait while this writer helped another patient however did so.  Patient's affect labile with congruent mood. Observed restless and pacing. Presents as preoccupied. Refusing to complete self inventory and refuses VS x 3. Will not answer as to why. States sinus headache is a 10/10, reporting congestion.   A: Medicated per orders, prn sudafed and tylenol given for h/a of a 10/10 and complaints of congestion. Level III obs in place for safety. Emotional support offered and self inventory reviewed. Encouraged completion of Suicide Safety Plan and programming participation. Discussed POC with MD, SW.  R: Patient verbalizes understanding of POC. On reassess, patient reports pain is now an 8/10, congestion somewhat improved. Patient denies SI/HI/AVH and remains safe on level III obs. Will continue to monitor closely and make verbal contact frequently.

## 2017-06-19 NOTE — Plan of Care (Signed)
Patient slept 6.25 hours last night. Refusing VS x 3 today therefore difficult to assess clinical measurements.

## 2017-06-20 NOTE — [facility] Group Notes (Signed)
[facility] Group Notes:  (Nursing/MHT/Case Management/Adjunct)  Date:  06/20/2017  Time:  4:57 PM  Type of Therapy:  Psychoeducational Skills  Participation Level:  Did Not Attend   John Copeland John Copeland John Copeland John Copeland 06/20/2017, 4:57 PM

## 2017-06-20 NOTE — Progress Notes (Signed)
Nursing Progress Note 1900-0730  D) Patient presents with flat affect. Patient is minimally engaged with peers/staff. Patient did attend group but did not share. Patient is isolative to room. Patient denies SI/HI/AVH or pain. Patient contracts for safety on the unit. Patient compliant with medications. Patient complains of cold symptoms including congestion and requested PRN medication.  A) Patient educated about and provided medication as scheduled or requested per provider's orders. Patient safety maintained with q15 min safety checks. High fall risk precautions in place. Emotional support given. 1:1 interaction and active listening provided. Snacks and fluids provided. Labs, vital signs and patient behavior monitored throughout shift. Patient encouraged to work on treatment plan.  R) Patient remains safe on the unit at this time. Patient agrees to make needs known to staff. Will continue to monitor and assess for changes.
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
## 2017-06-20 NOTE — Progress Notes (Signed)
Recreation Therapy Notes  Date: 06/20/17 Time: 1000 Location: 500 Hall Dayroom  Group Topic: Wellness  Goal Area(s) Addresses:  Patient will define components of whole wellness. Patient will verbalize benefit of whole wellness.  Intervention:  None  Activity: Chair Exercises.  LRT will lead patients through a series of chair exercises and stretches.  Education: Wellness, Discharge Planning.   Education Outcome: Acknowledges education/In group clarification offered/Needs additional education.   Clinical Observations/Feedback: Pt did not attend group.    Harish Bram, LRT/CTRS         Vito Beg A 06/20/2017 12:33 PM 

## 2017-06-20 NOTE — [facility] Group Notes (Signed)
[facility] LCSW Group Therapy 06/20/2017 1:15pm  Type of Therapy: Group Therapy- Feelings Around Discharge & Establishing a Supportive Framework  Participation Level:  Did Not Attend  Description of Group:   What is a supportive framework? What does it look like feel like and how do I discern it from and unhealthy non-supportive network? Learn how to cope when supports are not helpful and don't support you. Discuss what to do when your family/friends are not supportive.  Summary of Patient Progress   Therapeutic Modalities:   Cognitive Behavioral Therapy Person-Centered Therapy Motivational Interviewing   John Copeland M Haydon Dorris, Student-Social Work 06/20/2017 10:50 AM     

## 2017-06-20 NOTE — Progress Notes (Signed)
Nursing Progress Note 1900-0730  D) Patient presents calm and cooperative this evening but has a flat affect and poor eye contact. Patient forwards little with Clinical research associatewriter but is assertive with his needs. Patient is isolative to room. Patient denies SI/HI/AVH or pain. Patient contracts for safety on the unit. Patient compliant with medications.  A) Patient educated about and provided medication as scheduled or requested per provider's orders. Patient safety maintained with q15 min safety checks. Low fall risk precautions in place. Emotional support given. 1:1 interaction and active listening provided. Snacks and fluids provided. Labs, vital signs and patient behavior monitored throughout shift. Patient encouraged to work on treatment plan.  R) Patient remains safe on the unit at this time. Patient agrees to make needs known to staff. Will continue to monitor and assess for changes.

## 2017-06-20 NOTE — Plan of Care (Signed)
Patient is safe on the unit.  Offered support and encouragement as needed.  Routine safety check maintained Q 15 minutes.

## 2017-06-21 MED ORDER — TUBERCULIN PPD 5 UNIT/0.1ML ID SOLN: 5.0000 [IU] | Freq: Once | INTRADERMAL | Status: DC

## 2017-06-21 NOTE — Progress Notes (Signed)
Recreation Therapy Notes  Date: 06/21/17 Time: 1000 Location: 500 Hall  Group Topic: Team Building   Goal Area(s) Addresses:  Patient will effectively work together in group.  Patient will verbalize benefit of healthy team building. Patient will verbalize positive effect of healthy team building post d/c.  Patient will identify team building techniques that made activity effective for group.   Intervention: Rubber Discs  Activity: Shark in the Water.  Each patient was given a rubber disc.  The group as a whole was given one extra disc.  Patients were to work together to get from one end of the hall to the other and then back to their starting point.  Education:  Team Building, Discharge Planning  Education Outcome: Acknowledges understanding/In group clarification offered/Needs additional education.   Clinical Observations/Feedback:  Pt did not attend group.     Hava Massingale, LRT/CTRS         Selyna Klahn A 06/21/2017 12:46 PM 

## 2017-06-21 NOTE — Tx Team (Signed)
Interdisciplinary Treatment and Diagnostic Plan Update  06/21/2017 Time of Session: 8:34 AM  John Copeland MRN: [id number]  Principal Diagnosis: Schizophrenia [facility])  Secondary Diagnoses: Principal Problem:   Schizophrenia (HCC)   Current Medications:  Current Facility-Administered Medications  Medication Dose Route Frequency Provider Last Rate Last Dose  . acetaminophen (TYLENOL) tablet 650 mg  650 mg Oral Q6H PRN Nira Conn A, NP   650 mg at 06/19/17 1056  . alum & mag hydroxide-simeth (MAALOX/MYLANTA) 200-200-20 MG/5ML suspension 30 mL  30 mL Oral Q4H PRN Nira Conn A, NP      . ARIPiprazole (ABILIFY) tablet 15 mg  15 mg Oral Daily Micheal Likens, MD   15 mg at 06/21/17 4098  . divalproex (DEPAKOTE) DR tablet 500 mg  500 mg Oral Q12H Micheal Likens, MD   500 mg at 06/21/17 1191  . LORazepam (ATIVAN) injection 2 mg  2 mg Intramuscular Once Izediuno, Vincent A, MD      . OLANZapine zydis (ZYPREXA) disintegrating tablet 10 mg  10 mg Oral Q8H PRN Micheal Likens, MD   10 mg at 06/20/17 1908   And  . LORazepam (ATIVAN) tablet 1 mg  1 mg Oral PRN Micheal Likens, MD       And  . ziprasidone (GEODON) injection 20 mg  20 mg Intramuscular PRN Micheal Likens, MD      . magnesium hydroxide (MILK OF MAGNESIA) suspension 30 mL  30 mL Oral Daily PRN Nira Conn A, NP      . pseudoephedrine (SUDAFED) 12 hr tablet 120 mg  120 mg Oral Q12H PRN Micheal Likens, MD   120 mg at 06/20/17 2100  . traZODone (DESYREL) tablet 50 mg  50 mg Oral QHS,MR X 1 Nira Conn A, NP   50 mg at 06/20/17 2100    PTA Medications: Medications Prior to Admission  Medication Sig Dispense Refill Last Dose  . benztropine (COGENTIN) 0.5 MG tablet Take 0.5 mg by mouth 2 (two) times daily.    unknown  . dextromethorphan-guaiFENesin (MUCINEX DM) 30-600 MG 12hr tablet Take 1 tablet by mouth daily. (Patient not taking: Reported on 06/12/2017) 2 tablet 0 Not Taking  at Unknown time  . divalproex (DEPAKOTE) 250 MG DR tablet Take 250 mg by mouth 2 (two) times daily.    unknown  . doxycycline (VIBRAMYCIN) 100 MG capsule Take 1 capsule (100 mg total) by mouth 2 (two) times daily. (Patient not taking: Reported on 06/12/2017) 20 capsule 0 Not Taking at Unknown time  . fluPHENAZine (PROLIXIN) 10 MG tablet Take 10 mg by mouth 2 (two) times daily.   unknown  . fluPHENAZine decanoate (PROLIXIN) 25 MG/ML injection Inject 50 mg into the muscle every 14 (fourteen) days.   unknown  . fluticasone (FLONASE) 50 MCG/ACT nasal spray Place 2 sprays into both nostrils daily. (Patient not taking: Reported on 06/12/2017) 9.9 g 2 Not Taking at Unknown time  . Guaifenesin 1200 MG TB12 Take 1 tablet (1,200 mg total) by mouth 2 (two) times daily. (Patient not taking: Reported on 06/12/2017) 20 each 0 Not Taking at Unknown time  . lisinopril (PRINIVIL,ZESTRIL) 10 MG tablet Take 10 mg by mouth daily.   unknown  . metFORMIN (GLUCOPHAGE) 500 MG tablet Take 1,000 mg by mouth 2 (two) times daily with a meal.   unknown  . OXcarbazepine (TRILEPTAL) 150 MG tablet Take 150 mg by mouth 2 (two) times daily.   uknown  . promethazine-dextromethorphan (PROMETHAZINE-DM) 6.25-15 MG/5ML syrup  Take 5 mLs by mouth 4 (four) times daily as needed for cough. (Patient not taking: Reported on 06/12/2017) 120 mL 0 Not Taking at Unknown time    Treatment Modalities: Medication Management, Group therapy, Case management,  1 to 1 session with clinician, Psychoeducation, Recreational therapy.  Patient Stressors: Financial difficulties Medication change or noncompliance  Patient Strengths: Lawyer of knowledge Physical Health   Physician Treatment Plan for Primary Diagnosis: Schizophrenia (HCC) Long Term Goal(s): Improvement in symptoms so as ready for discharge  Short Term Goals: Ability to identify changes in lifestyle to reduce recurrence of condition will improve Compliance with prescribed medications  will improve  Medication Management: Evaluate patient's response, side effects, and tolerance of medication regimen.  Therapeutic Interventions: 1 to 1 sessions, Unit Group sessions and Medication administration.  Evaluation of Outcomes: Adequate for Discharge   2/6:Pt demonstrates improvement of his presenting mood symptoms, and he is tolerating his current regimen without difficulty or side effects. He will work with SW team and his guardian about arranging shelter options for after discharge.  -Continue inpatient hospitalization  -Schizoaffective disorder, bipolar type - Continue Abilify 15 mg po daily. - Depakote DR 500 mg poq12h (level on 06/20/17 AM)  -Agitation/psychosis. - Will continue the agitation protocol recommendation.  -Insomnia. - Continue Trazodone 50 mg po Q hs prn, may repeat x 1.    Physician Treatment Plan for Secondary Diagnosis: Principal Problem:   Schizophrenia (HCC)  Long Term Goal(s): Improvement in symptoms so as ready for discharge  Short Term Goals: Ability to identify changes in lifestyle to reduce recurrence of condition will improve Compliance with prescribed medications will improve  Medication Management: Evaluate patient's response, side effects, and tolerance of medication regimen.  Therapeutic Interventions: 1 to 1 sessions, Unit Group sessions and Medication administration.  Evaluation of Outcomes: Adequate for Discharge   RN Treatment Plan for Primary Diagnosis: Schizophrenia (HCC) Long Term Goal(s): Knowledge of disease and therapeutic regimen to maintain health will improve  Short Term Goals: Ability to verbalize frustration and anger appropriately will improve, Ability to demonstrate self-control, Ability to identify and develop effective coping behaviors will improve and Compliance with prescribed medications will improve  Medication Management: RN will administer medications as ordered by provider, will  assess and evaluate patient's response and provide education to patient for prescribed medication. RN will report any adverse and/or side effects to prescribing provider.  Therapeutic Interventions: 1 on 1 counseling sessions, Psychoeducation, Medication administration, Evaluate responses to treatment, Monitor vital signs and CBGs as ordered, Perform/monitor CIWA, COWS, AIMS and Fall Risk screenings as ordered, Perform wound care treatments as ordered.  Evaluation of Outcomes: Adequate for Discharge   LCSW Treatment Plan for Primary Diagnosis: Schizophrenia [facility]) Long Term Goal(s): Safe transition to appropriate next level of care at discharge, Engage patient in therapeutic group addressing interpersonal concerns.  Short Term Goals: Engage patient in aftercare planning with referrals and resources, Increase emotional regulation, Facilitate acceptance of mental health diagnosis and concerns, Identify triggers associated with mental health/substance abuse issues and Increase skills for wellness and recovery  Therapeutic Interventions: Assess for all discharge needs, 1 to 1 time with Social worker, Explore available resources and support systems, Assess for adequacy in community support network, Educate family and significant other(s) on suicide prevention, Complete Psychosocial Assessment, Interpersonal group therapy.  Evaluation of Outcomes: Adequate for Discharge  Will need to contact ACT team to coordinate d/c plan.  Unless guardian finds GH placement, will plan to d/c to shelter 2/8:  Guardian  states [facility] is not an option; has found placement and states patient will be picked up on Monday   Progress in Treatment: Attending groups: No Participating in groups: No Taking medication as prescribed: Yes Toleration of medication: Yes, no side effects reported at this time Family/Significant other contact made: Legal [place] ServiceGuardian, John Copeland [phone] Patient understands diagnosis: No,  limited insight Discussing patient identified problems/goals with staff: Yes Medical problems stabilized or resolved: Yes Denies suicidal/homicidal ideation: Yes Issues/concerns per patient self-inventory: None Other: N/A  New problem(s) identified: None identified at this time.   New Short Term/Long Term Goal(s): "Unable to identify any goals"   Discharge Plan or Barriers:    Reason for Continuation of Hospitalization: Hallucinations Medication stabilization   Estimated Length of Stay: Likely d/c Monday  Attendees: Patient:  06/21/2017  8:34 AM  Physician: John Loahristopher Rainville, MD 06/21/2017  8:34 AM  Nursing: Waynetta SandyJan Wright, RN 06/21/2017  8:34 AM  RN Care Manager: Onnie BoerJennifer Clark, RN 06/21/2017  8:34 AM  Social Worker: Richelle Itood Avyonna Wagoner, LCSW; Melba CoonAngel Webster, Social Work Intern 06/21/2017  8:34 AM  Recreational Therapist: Caroll RancherMarjette [name], LRT 06/21/2017  8:34 AM  Other: Tomasita Morrowelora Sutton, P4CC 06/21/2017  8:34 AM  Other:  06/21/2017  8:34 AM  Other: 06/21/2017  8:34 AM    Scribe for Treatment Team: Ida Rogueodney B Danean Marner, LCSW 06/21/2017 8:34 AM
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
## 2017-06-21 NOTE — Progress Notes (Signed)
Adult Psychoeducational Group Note  Date:  06/21/2017 Time:  8:53 PM  Group Topic/Focus:  Wrap-Up Group:   The focus of this group is to help patients review their daily goal of treatment and discuss progress on daily workbooks.  Participation Level:  Active  Participation Quality:  Attentive  Affect:  Appropriate  Cognitive:  Appropriate  Insight: Appropriate  Engagement in Group:  Engaged  Modes of Intervention:  Discussion  Additional Comments: The patient expressed that he rates today a 8.The patient also said that he thought Hope group was good.  Octavio Mannshigpen, Sharlot Sturkey Lee 06/21/2017, 8:53 PM

## 2017-06-21 NOTE — [facility] Group Notes (Signed)
Adult Psychoeducational Group Note  Date:  06/21/2017 Time:  5:07 AM  Group Topic/Focus:  Wrap-Up Group:   The focus of this group is to help patients review their daily goal of treatment and discuss progress on daily workbooks.  Participation Level:  Minimal  Participation Quality:  Appropriate  Affect:  Appropriate  Cognitive:  Alert and Appropriate  Insight: Appropriate and Good  Engagement in Group:  Lacking  Modes of Intervention:  Discussion  Additional Comments:  Pt attended and participated in wrap up group. Pt had Copeland good day because "it went by fast". Pt goal was to get discharged and they received Copeland discharge date from the Dr., which was also noted as Copeland positive by the pt.   John NettersOctavia Copeland John Copeland 06/21/2017, 5:07 AM

## 2017-06-21 NOTE — [facility] Group Notes (Signed)
[facility] LCSW Group Therapy  06/21/2017  1:05 PM  Type of Therapy:  Group therapy  Participation Level:  Active  Participation Quality:  Attentive  Affect:  Flat  Cognitive:  Oriented  Insight:  Limited  Engagement in Therapy:  Limited  Modes of Intervention:  Discussion, Socialization  Summary of Progress/Problems:  Chaplain was here to lead a group on themes of hope and courage. Invited.  Chose to not attend.  Daryel Geraldorth, Elzie Knisley B 06/21/2017 1:22 PM

## 2017-06-22 DIAGNOSIS — F203 Undifferentiated schizophrenia: Secondary | ICD-10-CM

## 2017-06-22 NOTE — [facility] Group Notes (Signed)
LCSW Group Therapy Note  06/22/2017 9:30-10:30AM - 300 Hall, 10:30-11:30 - 400 Hall, 11:30-12:00 - 500 Hall  Type of Therapy and Topic:  Group Therapy: Anger Cues and Responses  Participation Level:  Did Not Attend   Description of Group:   In this group, patients learned how to recognize the physical, cognitive, emotional, and behavioral responses they have to anger-provoking situations.  They identified a recent time they became angry and how they reacted.  They analyzed how their reaction was possibly beneficial and how it was possibly unhelpful.  The group discussed a variety of healthier coping skills that could help with such a situation in the future.  Deep breathing was practiced briefly.  Therapeutic Goals: 1. Patients will remember their last incident of anger and how they felt emotionally and physically, what their thoughts were at the time, and how they behaved. 2. Patients will identify how their behavior at that time worked for them, as well as how it worked against them. 3. Patients will explore possible new behaviors to use in future anger situations. 4. Patients will learn that anger itself is normal and cannot be eliminated, and that healthier reactions can assist with resolving conflict rather than worsening situations.  Summary of Patient Progress:  N/A  Therapeutic Modalities:   Cognitive Behavioral Therapy  John Copeland  06/22/2017 8:52 AM  
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
## 2017-06-22 NOTE — Progress Notes (Signed)
Patient has been calm and cooperative this shift.   Patient attended groups and participated in unit activities.  Patient denied TB tests, and labs this shift  Assess patient, offer medications as prescribed engage patient in 1:1 Staff talks.  Patient able to contract for safety, continue to monitor as planned.
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
## 2017-06-22 NOTE — Progress Notes (Signed)
Nursing Progress Note 1900-0730  D) Patient presents calm and cooperative this evening. Patient did attend group and is seen up in the milieu. Patient denies SI/HI/AVH or pain. Patient contracts for safety on the unit. Patient reports sleeping well with current regimen. Patient compliant with medications. Patient denies concerns for writer this evening.  A) Patient educated about and provided medication as scheduled or requested per provider's orders. Patient safety maintained with q15 min safety checks. Low fall risk precautions in place. Emotional support given. 1:1 interaction and active listening provided. Snacks and fluids provided. Labs, vital signs and patient behavior monitored throughout shift. Patient encouraged to work on treatment plan.  R) Patient remains safe on the unit at this time. Patient agrees to make needs known to staff. Will continue to monitor and assess for changes.

## 2017-06-22 NOTE — Progress Notes (Signed)
Pt came to staff wanting to be switched of hall due to conflict with other patient
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
## 2017-06-23 DIAGNOSIS — Z79899 Other long term (current) drug therapy: Secondary | ICD-10-CM

## 2017-06-23 MED ORDER — ATOMOXETINE HCL 18 MG PO CAPS
18.0000 mg | ORAL_CAPSULE | Freq: Every day | ORAL | Status: DC
  Administered 2017-06-24: 18 mg via ORAL
  Filled 2017-06-23 (×4): qty 1

## 2017-06-23 MED ORDER — TRAZODONE HCL 50 MG PO TABS
50.0000 mg | ORAL_TABLET | Freq: Every evening | ORAL | Status: DC | PRN
  Administered 2017-06-23: 50 mg via ORAL
  Filled 2017-06-23: qty 1

## 2017-06-23 MED ORDER — LORAZEPAM 0.5 MG PO TABS
0.5000 mg | ORAL_TABLET | Freq: Four times a day (QID) | ORAL | Status: DC | PRN
  Administered 2017-06-23 – 2017-06-24 (×4): 0.5 mg via ORAL
  Filled 2017-06-23 (×5): qty 1

## 2017-06-23 NOTE — [facility] Group Notes (Signed)
[facility] LCSW Group Therapy Note  Date/Time:  06/23/2017  11:00AM-12:00PM  Type of Therapy and Topic:  Group Therapy:  Music and Mood  Participation Level:  None   Description of Group: In this process group, members listened to a variety of genres of music and identified that different types of music evoke different responses.  Patients were encouraged to identify music that was soothing for them and music that was energizing for them.  Patients discussed how this knowledge can help with wellness and recovery in various ways including managing depression and anxiety as well as encouraging healthy sleep habits.    Therapeutic Goals: 1. Patients will explore the impact of different varieties of music on mood 2. Patients will verbalize the thoughts they have when listening to different types of music 3. Patients will identify music that is soothing to them as well as music that is energizing to them 4. Patients will discuss how to use this knowledge to assist in maintaining wellness and recovery 5. Patients will explore the use of music as a coping skill  Summary of Patient Progress:  The patient came in and out of the room multiple times, and did not talk in answer to questions about how different types of music made him feel.  At the end of group, he did laugh at a little joke.  Therapeutic Modalities: Solution Focused Brief Therapy Activity   Ambrose MantleMareida Grossman-Orr, LCSW

## 2017-06-23 NOTE — Plan of Care (Signed)
Patient irritable, labile, impulsive however has not had any anger outbursts.   Patient remains guarded, isolative. Minimal participation in programming.

## 2017-06-23 NOTE — Progress Notes (Addendum)
[facility] MD Progress Note  06/23/2017 2:54 PM John Copeland  MRN:  [id number] Subjective:  Patient reports he is " feeling OK".  Denies suicidal ideations. At this time denies medication side effects. At this time he is focused on ADHD type symptoms. He states " I have a problem with attention, it is hard for me to focus". States that these symptoms are not new, and he feels they have caused him to have learning difficulties at school and to be unable to reach his potential. Objective: I have met with patient and have reviewed chart notes,./ Patient is 27 year old male, has been diagnosed with Schizoaffective Disorder. Is connected to ACT team. Was admitted under IVC . He has history of aggressive behaviors which have resulted in him being kicked out of different living situations . At this time he is homeless.  At present he is calm, polite during our session. No agitated or hostile presentation at this time. He is future oriented, and focusing on disposition plans. States he is hoping he will be able to go to [place] after discharge. As above, states that ADHD type symptoms ( difficulty with concentration, attention, distractibility) have been a significant and chronic difficulty for him. As per staff report, he has been calmer, although still irritable at times, and yesterday afternoon had episode of yelling at another patient after being accused of going into their room.  Denies suicidal ideations. Denies homicidal or violent ideations at present. Denies hallucinations. Does note endorse medication side effects.   Principal Problem: Schizoaffective Disorder  Diagnosis:   Patient Active Problem List   Diagnosis Date Noted  . Accidental drug overdose [T50.901A]   . Schizophrenia ([place]) [F20.9] 03/29/2017  . Polydipsia [R63.1] 01/12/2017  . Diphenhydramine overdose of undetermined intent [T45.0X4A] 01/10/2017  . Tobacco use disorder [F17.200] 10/07/2016  . Diabetes ([place]) [E11.9]  10/07/2016  . HTN (hypertension) [I10] 10/07/2016  . Dyslipidemia [E78.5] 10/07/2016  . Schizoaffective disorder, bipolar type ([place]) [F25.0] 11/30/2015   Total Time spent with patient: 20 minutes  Past Psychiatric History: see H&P  Past Medical History:  Past Medical History:  Diagnosis Date  . Anxiety   . Depression   . Diabetes mellitus without complication ([place])   . Homelessness   . Hypertension   . Schizophrenia ([place])    History reviewed. No pertinent surgical history. Family History:  Family History  Problem Relation Age of Onset  . Diabetes Other    Family Psychiatric  History: see H&P Social History:  Social History   Substance and Sexual Activity  Alcohol Use No     Social History   Substance and Sexual Activity  Drug Use Yes  . Types: Marijuana   Comment: denies drug use    Social History   Socioeconomic History  . Marital status: Single    Spouse name: None  . Number of children: None  . Years of education: None  . Highest education level: None  Social Needs  . Financial resource strain: None  . Food insecurity - worry: None  . Food insecurity - inability: None  . Transportation needs - medical: None  . Transportation needs - non-medical: None  Occupational History  . None  Tobacco Use  . Smoking status: Current Every Day Smoker    Packs/day: 0.00    Types: Cigarettes  . Smokeless tobacco: Never Used  Substance and Sexual Activity  . Alcohol use: No  . Drug use: Yes    Types: Marijuana  Comment: denies drug use  . Sexual activity: Not Currently  Other Topics Concern  . None  Social History Narrative  . None   Additional Social History:   Sleep: improved   Appetite:  " OK"   Current Medications: Current Facility-Administered Medications  Medication Dose Route Frequency Provider Last Rate Last Dose  . acetaminophen (TYLENOL) tablet 650 mg  650 mg Oral Q6H PRN Lindon Romp A, NP   650 mg at 06/23/17 1429  . alum & mag  hydroxide-simeth (MAALOX/MYLANTA) 200-200-20 MG/5ML suspension 30 mL  30 mL Oral Q4H PRN Lindon Romp A, NP      . ARIPiprazole (ABILIFY) tablet 15 mg  15 mg Oral Daily Pennelope Bracken, MD   15 mg at 06/23/17 0805  . divalproex (DEPAKOTE) DR tablet 500 mg  500 mg Oral Q12H Pennelope Bracken, MD   500 mg at 06/23/17 0805  . LORazepam (ATIVAN) injection 2 mg  2 mg Intramuscular Once Izediuno, Vincent A, MD      . LORazepam (ATIVAN) tablet 0.5 mg  0.5 mg Oral Q6H PRN Cayton Cuevas, Myer Peer, MD   0.5 mg at 06/23/17 0928  . magnesium hydroxide (MILK OF MAGNESIA) suspension 30 mL  30 mL Oral Daily PRN Lindon Romp A, NP      . OLANZapine zydis (ZYPREXA) disintegrating tablet 10 mg  10 mg Oral Q8H PRN Pennelope Bracken, MD   10 mg at 06/23/17 1429   And  . ziprasidone (GEODON) injection 20 mg  20 mg Intramuscular PRN Pennelope Bracken, MD      . pseudoephedrine (SUDAFED) 12 hr tablet 120 mg  120 mg Oral Q12H PRN Pennelope Bracken, MD   120 mg at 06/23/17 0929  . traZODone (DESYREL) tablet 50 mg  50 mg Oral QHS,MR X 1 Lindon Romp A, NP   50 mg at 06/22/17 2122  . tuberculin injection 5 Units  5 Units Intradermal Once Pennelope Bracken, MD        Lab Results: No results found for this or any previous visit (from the past 48 hour(s)).  Blood Alcohol level:  Lab Results  Component Value Date   ETH <10 06/02/2017   ETH <10 93/71/6967    Metabolic Disorder Labs: Lab Results  Component Value Date   HGBA1C 5.8 (H) 01/19/2017   MPG 119.76 01/19/2017   No results found for: PROLACTIN Lab Results  Component Value Date   CHOL 103 01/19/2017   TRIG 48 01/19/2017   HDL 36 (L) 01/19/2017   CHOLHDL 2.9 01/19/2017   VLDL 10 01/19/2017   LDLCALC 57 01/19/2017    Physical Findings: AIMS: Facial and Oral Movements Muscles of Facial Expression: None, normal Lips and Perioral Area: None, normal Jaw: None, normal Tongue: None, normal,Extremity Movements Upper  (arms, wrists, hands, fingers): None, normal Lower (legs, knees, ankles, toes): None, normal, Trunk Movements Neck, shoulders, hips: None, normal, Overall Severity Severity of abnormal movements (highest score from questions above): None, normal Incapacitation due to abnormal movements: None, normal Patient's awareness of abnormal movements (rate only patient's report): No Awareness, Dental Status Current problems with teeth and/or dentures?: No Does patient usually wear dentures?: No  CIWA:    COWS:     Musculoskeletal: Strength & Muscle Tone: within normal limits Gait & Station: normal Patient leans: Backward  Psychiatric Specialty Exam: Physical Exam  Nursing note and vitals reviewed.   Review of Systems  Constitutional: Negative for chills and fever.  Respiratory: Negative for cough and  shortness of breath.   Cardiovascular: Negative for chest pain.  Gastrointestinal: Negative for abdominal pain, heartburn, nausea and vomiting.  Psychiatric/Behavioral: Negative for depression, hallucinations and suicidal ideas. The patient is not nervous/anxious.   denies chest pain, no shortness of breath, no vomiting , no fever , no chills   Blood pressure 126/79, pulse (!) 102, temperature 98.6 F (37 C), resp. rate 16, height _0  (1.803 m), weight 79.4 kg (175 lb).Body mass index is 24.41 kg/m.  General Appearance: Well Groomed  Eye Contact:  Good  Speech:  Normal Rate  Volume:  Normal  Mood:  states his mood is better   Affect:  not irritable or angry during this session, reports some anxiety  Thought Process:  Linear and Descriptions of Associations: Intact  Orientation:  Other:  fully alert and attentive   Thought Content:  denies hallucinations, no delusions expressed at this time  Suicidal Thoughts:  No denies suicidal or self injurious ideations, denies homicidal ideations, contracts for safety on unit at this time  Homicidal Thoughts:  No  Memory:  Recent and remote grossly  intact   Judgement:  Fair  Insight:  Fair  Psychomotor Activity:  Normal  Concentration:  Concentration: Good and Attention Span: Good  Recall:  Good  Fund of Knowledge:  Good  Language:  Good  Akathisia:  No  Handed:    AIMS (if indicated):     Assets:  Communication Skills Resilience  ADL's:  Intact  Cognition:  WNL  Sleep:  Number of Hours: 6.75    Assessment - patient reports he is feeling better, denies medication side effects. He has improved compared to admission but continues to present irritable and impulsive last night, and yesterday afternoon had episode of verbal outburst with another peer. At this time presents calm, future oriented, focused on where he will go after discharge. Of note, patient describes long term ADHD type symptoms, mainly difficulty with attention, focus and distractibility, which he feels has affected his ability to learn and function . He is interested in treatment for this- we have reviewed rationale to minimize potentially addictive medications, but discussed other options such as atomoxetine or buproprion, and reviewed potential side effects.  Treatment Plan Summary:  Treatment Plan reviewed as below today 2/10   Daily contact with patient to assess and evaluate symptoms and progress in treatment and Medication management.  Encourage group and milieu participation to work on Radiographer, therapeutic and symptom reduction Continue Abilify 15 mgrs QDAY for mood disorder , psychosis Start Atomoxetine at 18 mgrs QDAY initially for ADHD symptoms as above ( would start low and titrate gradually to minimize potential side effects) Continue Depakote 500 mgrs Q 12 hours for mood disorder, intermittent explosiveness Continue Trazodone 50 mgrs QHS PRN for insomnia as needed   Continue Agitation protocol as needed  PPD  in anticipation of placement  Check Valproic Acid serum level, HgbA1C  in AM D/C Sudafed Treatment team working on disposition planning  options     Jenne Campus, MD 06/23/2017, 2:54 PM    Patient ID: John Copeland, male   DOB: 1991-02-02, 27 y.o.   MRN: [id number]

## 2017-06-23 NOTE — Progress Notes (Signed)
Pt did not attend afternoon psychoed group. 

## 2017-06-23 NOTE — [facility] Group Notes (Signed)
[facility] Group Notes:  (Nursing/MHT/Case Management/Adjunct)  Date:  06/23/2017  Time:  1000  Type of Therapy:  Nurse Education - Healthy Support Systems vs. Toxic Relationships  Participation Level:  Did Not Attend  Participation Quality:    Affect:    Cognitive:    Insight:    Engagement in Group:    Modes of Intervention:    Summary of Progress/Problems: Patient was personally invited by this Clinical research associatewriter and encouraged to attend group however patient refused.    Lawrence MarseillesFriedman, Jermy Couper Eakes 06/23/2017, 10:49 AM

## 2017-06-23 NOTE — Progress Notes (Signed)
D: Patient observed up and pacing on unit. Impulsive and intrusive with needs however paranoid and resistant during interactions. Comes up, expresses his needs with pressured speech however does not expand on any other questions. Patient states the doctor said he might start wellbutrin. "Is it in there? I need ativan too. I need something to calm me down " Patient's affect anxious, agitated, irritable with congruent mood. Per self inventory and discussions with writer, rates depression, hopelessness and anxiety all at a 0/10. Rates sleep as good, appetite as fair-good and energy as normal.  States goal for today is to "go to at least one group." Denies pain, however complains of congestion.   A: Medicated per orders, prn ativan and sudafed given for complaints. Explained to patient PPD is needed. Level III obs in place for safety. Emotional support offered and self inventory reviewed. Encouraged programming participation. Discussed POC with MD.  Fall prevention plan in place and reviewed with patient as pt is a moderate fall risk due to impulsiveness.   R: Patient verbalizes understanding of POC, falls prevention education. On reassess, patient is asleep. Patient continues to refuse PPD placement stating, " I just don't want it. That's it. That's all I'm going to say." Patient denies SI/HI/AVH and remains safe on level III obs. Will continue to monitor closely and make verbal contact frequently.

## 2017-06-23 NOTE — Progress Notes (Signed)
D: Pt was calm most of the evening until another patient accused him of going in their room, pt became upset wanting to fight , yelling verbally aggressive things. The other pt moved off the unit and John Copeland made a unit restriction.   A: Pt was offered support and encouragement. Pt was given scheduled medications. Pt was encourage to attend groups. Q 15 minute checks were done for safety.  R: safety maintained on unit.

## 2017-06-24 ENCOUNTER — Emergency Department (HOSPITAL_COMMUNITY)
Admission: EM | Admit: 2017-06-24 | Discharge: 2017-06-24 | Disposition: A | Discharge status: Home / Self Care | Payer: Medicaid Other | Source: Home / Self Care | Attending: Emergency Medicine | Admitting: Emergency Medicine

## 2017-06-24 ENCOUNTER — Encounter (HOSPITAL_COMMUNITY): Payer: Self-pay | Admitting: Emergency Medicine

## 2017-06-24 DIAGNOSIS — F17210 Nicotine dependence, cigarettes, uncomplicated: Secondary | ICD-10-CM

## 2017-06-24 DIAGNOSIS — B349 Viral infection, unspecified: Secondary | ICD-10-CM

## 2017-06-24 DIAGNOSIS — I10 Essential (primary) hypertension: Secondary | ICD-10-CM

## 2017-06-24 DIAGNOSIS — E119 Type 2 diabetes mellitus without complications: Secondary | ICD-10-CM

## 2017-06-24 MED ORDER — ARIPIPRAZOLE 15 MG PO TABS: 15.0000 mg | ORAL_TABLET | Freq: Every day | ORAL | 0 refills | Status: DC

## 2017-06-24 MED ORDER — ATOMOXETINE HCL 18 MG PO CAPS: 18.0000 mg | ORAL_CAPSULE | Freq: Every day | ORAL | 0 refills | Status: DC

## 2017-06-24 MED ORDER — IBUPROFEN 800 MG PO TABS
800.0000 mg | ORAL_TABLET | Freq: Once | ORAL | Status: AC
  Administered 2017-06-24: 800 mg via ORAL
  Filled 2017-06-24: qty 1

## 2017-06-24 MED ORDER — FLUTICASONE PROPIONATE 50 MCG/ACT NA SUSP: 1.0000 | Freq: Every day | NASAL | 2 refills | Status: DC

## 2017-06-24 MED ORDER — IBUPROFEN 800 MG PO TABS: 800.0000 mg | ORAL_TABLET | Freq: Three times a day (TID) | ORAL | 0 refills | Status: DC

## 2017-06-24 MED ORDER — TRAZODONE HCL 50 MG PO TABS: 50.0000 mg | ORAL_TABLET | Freq: Every evening | ORAL | 0 refills | Status: DC | PRN

## 2017-06-24 MED ORDER — BENZONATATE 100 MG PO CAPS: 100.0000 mg | ORAL_CAPSULE | Freq: Three times a day (TID) | ORAL | 0 refills | Status: DC | PRN

## 2017-06-24 MED ORDER — DIVALPROEX SODIUM 500 MG PO DR TAB: 500.0000 mg | DELAYED_RELEASE_TABLET | Freq: Two times a day (BID) | ORAL | 0 refills | Status: DC

## 2017-06-24 NOTE — ED Notes (Signed)
Pt asked if his name was called and is in the waiting room now.

## 2017-06-24 NOTE — Tx Team (Signed)
Interdisciplinary Treatment and Diagnostic Plan Update  06/24/2017 Time of Session: 8:06 AM  John MuffJoshua Omar Copeland MRN: [id number]  Principal Diagnosis: Schizophrenia [facility](HCC)  Secondary Diagnoses: Principal Problem:   Schizophrenia (HCC)   Current Medications:  Current Facility-Administered Medications  Medication Dose Route Frequency Provider Last Rate Last Dose  . acetaminophen (TYLENOL) tablet 650 mg  650 mg Oral Q6H PRN Nira ConnBerry, Jason A, NP   650 mg at 06/23/17 1429  . alum & mag hydroxide-simeth (MAALOX/MYLANTA) 200-200-20 MG/5ML suspension 30 mL  30 mL Oral Q4H PRN Nira ConnBerry, Jason A, NP      . ARIPiprazole (ABILIFY) tablet 15 mg  15 mg Oral Daily Micheal Likensainville, Christopher T, MD   15 mg at 06/23/17 0805  . atomoxetine (STRATTERA) capsule 18 mg  18 mg Oral Daily Cobos, Fernando A, MD      . divalproex (DEPAKOTE) DR tablet 500 mg  500 mg Oral Q12H Micheal Likensainville, Christopher T, MD   500 mg at 06/23/17 2018  . LORazepam (ATIVAN) injection 2 mg  2 mg Intramuscular Once Izediuno, Vincent A, MD      . LORazepam (ATIVAN) tablet 0.5 mg  0.5 mg Oral Q6H PRN Cobos, Rockey SituFernando A, MD   0.5 mg at 06/23/17 2235  . magnesium hydroxide (MILK OF MAGNESIA) suspension 30 mL  30 mL Oral Daily PRN Nira ConnBerry, Jason A, NP      . OLANZapine zydis (ZYPREXA) disintegrating tablet 10 mg  10 mg Oral Q8H PRN Micheal Likensainville, Christopher T, MD   10 mg at 06/23/17 1429   And  . ziprasidone (GEODON) injection 20 mg  20 mg Intramuscular PRN Micheal Likensainville, Christopher T, MD      . traZODone (DESYREL) tablet 50 mg  50 mg Oral QHS PRN Cobos, Rockey SituFernando A, MD   50 mg at 06/23/17 2235  . tuberculin injection 5 Units  5 Units Intradermal Once Micheal Likensainville, Christopher T, MD        PTA Medications: Medications Prior to Admission  Medication Sig Dispense Refill Last Dose  . benztropine (COGENTIN) 0.5 MG tablet Take 0.5 mg by mouth 2 (two) times daily.    unknown  . dextromethorphan-guaiFENesin (MUCINEX DM) 30-600 MG 12hr tablet Take 1 tablet by mouth  daily. (Patient not taking: Reported on 06/12/2017) 2 tablet 0 Not Taking at Unknown time  . divalproex (DEPAKOTE) 250 MG DR tablet Take 250 mg by mouth 2 (two) times daily.    unknown  . doxycycline (VIBRAMYCIN) 100 MG capsule Take 1 capsule (100 mg total) by mouth 2 (two) times daily. (Patient not taking: Reported on 06/12/2017) 20 capsule 0 Not Taking at Unknown time  . fluPHENAZine (PROLIXIN) 10 MG tablet Take 10 mg by mouth 2 (two) times daily.   unknown  . fluPHENAZine decanoate (PROLIXIN) 25 MG/ML injection Inject 50 mg into the muscle every 14 (fourteen) days.   unknown  . fluticasone (FLONASE) 50 MCG/ACT nasal spray Place 2 sprays into both nostrils daily. (Patient not taking: Reported on 06/12/2017) 9.9 g 2 Not Taking at Unknown time  . Guaifenesin 1200 MG TB12 Take 1 tablet (1,200 mg total) by mouth 2 (two) times daily. (Patient not taking: Reported on 06/12/2017) 20 each 0 Not Taking at Unknown time  . lisinopril (PRINIVIL,ZESTRIL) 10 MG tablet Take 10 mg by mouth daily.   unknown  . metFORMIN (GLUCOPHAGE) 500 MG tablet Take 1,000 mg by mouth 2 (two) times daily with a meal.   unknown  . OXcarbazepine (TRILEPTAL) 150 MG tablet Take 150 mg by mouth 2 (  two) times daily.   uknown  . promethazine-dextromethorphan (PROMETHAZINE-DM) 6.25-15 MG/5ML syrup Take 5 mLs by mouth 4 (four) times daily as needed for cough. (Patient not taking: Reported on 06/12/2017) 120 mL 0 Not Taking at Unknown time    Treatment Modalities: Medication Management, Group therapy, Case management,  1 to 1 session with clinician, Psychoeducation, Recreational therapy.  Patient Stressors: Financial difficulties Medication change or noncompliance  Patient Strengths: Lawyer of knowledge Physical Health   Physician Treatment Plan for Primary Diagnosis: Schizophrenia (HCC) Long Term Goal(s): Improvement in symptoms so as ready for discharge  Short Term Goals: Ability to identify changes in lifestyle to reduce  recurrence of condition will improve Compliance with prescribed medications will improve  Medication Management: Evaluate patient's response, side effects, and tolerance of medication regimen.  Therapeutic Interventions: 1 to 1 sessions, Unit Group sessions and Medication administration.  Evaluation of Outcomes: Adequate for Discharge   2/6:Pt demonstrates improvement of his presenting mood symptoms, and he is tolerating his current regimen without difficulty or side effects. He will work with SW team and his guardian about arranging shelter options for after discharge.  -Continue inpatient hospitalization  -Schizoaffective disorder, bipolar type - Continue Abilify 15 mg po daily. - Depakote DR 500 mg poq12h (level on 06/20/17 AM)  -Agitation/psychosis. - Will continue the agitation protocol recommendation.  -Insomnia. - Continue Trazodone 50 mg po Q hs prn, may repeat x 1.    Physician Treatment Plan for Secondary Diagnosis: Principal Problem:   Schizophrenia (HCC)  Long Term Goal(s): Improvement in symptoms so as ready for discharge  Short Term Goals: Ability to identify changes in lifestyle to reduce recurrence of condition will improve Compliance with prescribed medications will improve  Medication Management: Evaluate patient's response, side effects, and tolerance of medication regimen.  Therapeutic Interventions: 1 to 1 sessions, Unit Group sessions and Medication administration.  Evaluation of Outcomes: Adequate for Discharge   RN Treatment Plan for Primary Diagnosis: Schizophrenia (HCC) Long Term Goal(s): Knowledge of disease and therapeutic regimen to maintain health will improve  Short Term Goals: Ability to verbalize frustration and anger appropriately will improve, Ability to demonstrate self-control, Ability to identify and develop effective coping behaviors will improve and Compliance with prescribed medications will improve  Medication  Management: RN will administer medications as ordered by provider, will assess and evaluate patient's response and provide education to patient for prescribed medication. RN will report any adverse and/or side effects to prescribing provider.  Therapeutic Interventions: 1 on 1 counseling sessions, Psychoeducation, Medication administration, Evaluate responses to treatment, Monitor vital signs and CBGs as ordered, Perform/monitor CIWA, COWS, AIMS and Fall Risk screenings as ordered, Perform wound care treatments as ordered.  Evaluation of Outcomes: Adequate for Discharge   LCSW Treatment Plan for Primary Diagnosis: Schizophrenia [facility]) Long Term Goal(s): Safe transition to appropriate next level of care at discharge, Engage patient in therapeutic group addressing interpersonal concerns.  Short Term Goals: Engage patient in aftercare planning with referrals and resources, Increase emotional regulation, Facilitate acceptance of mental health diagnosis and concerns, Identify triggers associated with mental health/substance abuse issues and Increase skills for wellness and recovery  Therapeutic Interventions: Assess for all discharge needs, 1 to 1 time with Social worker, Explore available resources and support systems, Assess for adequacy in community support network, Educate family and significant other(s) on suicide prevention, Complete Psychosocial Assessment, Interpersonal group therapy.  Evaluation of Outcomes: Adequate for Discharge  Will need to contact ACT team to coordinate d/c plan.  Unless  guardian finds GH placement, will plan to d/c to shelter 2/8:  Guardian states [facility] is not an option; has found placement and states patient will be picked up on Monday   Progress in Treatment: Attending groups: No Participating in groups: No Taking medication as prescribed: Yes Toleration of medication: Yes, no side effects reported at this time Family/Significant other contact made: Legal  [place] [employer] [phone] Patient understands diagnosis: No, limited insight Discussing patient identified problems/goals with staff: Yes Medical problems stabilized or resolved: Yes Denies suicidal/homicidal ideation: Yes Issues/concerns per patient self-inventory: None Other: N/A  New problem(s) identified: None identified at this time.   New Short Term/Long Term Goal(s): "Unable to identify any goals"   Discharge Plan or Barriers:  ACT team will pick him up on day of discharge and will resume services. Pt also provided with [facility] information for additional community supports. [facility].   Reason for Continuation of Hospitalization: none  Estimated Length of Stay: 06/24/17 Monday  Attendees: Patient:  06/24/2017  8:06 AM  Physician: Jolyne Loa, MD 06/24/2017  8:06 AM  Nursing: Rayfield Citizen RN; Patrice RN 06/24/2017  8:06 AM  RN Care Manager: Onnie Boer, RN 06/24/2017  8:06 AM  Social Worker: [employer], LCSW 06/24/2017  8:06 AM  Recreational Therapist:  06/24/2017  8:06 AM  Other: x 06/24/2017  8:06 AM  Other:  06/24/2017  8:06 AM  Other: 06/24/2017  8:06 AM    Scribe for Treatment Team: Ledell Peoples Smart, LCSW 06/24/2017 8:06 AM

## 2017-06-24 NOTE — ED Triage Notes (Signed)
[facility]IB EMS[facility] Called out for cold S/S X1 mo, pt has been seen here multiple times for same. Also stated his sugar was high, 127 with EMS

## 2017-06-24 NOTE — ED Provider Notes (Signed)
[name] EMERGENCY DEPARTMENT Provider Note   CSN: [id number] Arrival date & time: 06/24/17  1928     History   Chief Complaint Chief Complaint  Patient presents with  . Cold like S/S    HPI John Copeland is a 27 y.o. male with a hx of DM, anxiety, tobacco abuse HTN, and schizophrenia who arrives to the ED via EMS complaining of URI sxs for the past 1 month. State he is having congestion, rhinorrhea, sore throat, and cough productive of green mucous sputum. Also having headache only when coughing, otherwise none. Has tried Robitussin without relief.  States sxs worse in the AM when he wakes up with the congestion. No other alleviating/aggravating factors. Denies fevers, dyspnea, chest pain, palpitations, vomiting, or diarrhea. Denies change in vision, numbness, weakness, or dizziness.   HPI  Past Medical History:  Diagnosis Date  . Anxiety   . Depression   . Diabetes mellitus without complication (HCC)   . Homelessness   . Hypertension   . Schizophrenia [facility])     Patient Active Problem List   Diagnosis Date Noted  . Accidental drug overdose   . Schizophrenia (HCC) 03/29/2017  . 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 Diphenhydramine overdose of undetermined intent 01/10/2017  . Tobacco use disorder 10/07/2016  . Diabetes (HCC) 10/07/2016  . HTN (hypertension) 10/07/2016  . Dyslipidemia 10/07/2016  . Schizoaffective disorder, bipolar type (HCC) 11/30/2015    History reviewed. No pertinent surgical history.     Home Medications    Prior to Admission medications   Medication Sig Start Date End Date Taking? Authorizing Provider  ARIPiprazole (ABILIFY) 15 MG tablet Take 1 tablet (15 mg total) by mouth daily. For mood control 06/25/17   Armandina Stammer I, NP  atomoxetine (STRATTERA) 18 MG capsule Take 1 capsule (18 mg total) by mouth daily. For ADHD 06/25/17   Armandina Stammer I, NP  divalproex (DEPAKOTE) 500 MG DR tablet Take 1 tablet (500 mg total) by mouth every  12 (twelve) hours. For mood stabilization 06/24/17   Armandina Stammer I, NP  traZODone (DESYREL) 50 MG tablet Take 1 tablet (50 mg total) by mouth at bedtime as needed for sleep. 06/24/17   Sanjuana Kava, NP    Family History Family History  Problem Relation Age of Onset  . Diabetes Other     Social History Social History   Tobacco Use  . Smoking status: Current Every Day Smoker    Packs/day: 0.00    Types: Cigarettes  . Smokeless tobacco: Never Used  Substance Use Topics  . Alcohol use: No  . Drug use: Yes    Types: Marijuana    Comment: denies drug use     Allergies   Haldol [haloperidol lactate]   Review of Systems Review of Systems  Constitutional: Negative for chills and fever.  HENT: Positive for congestion, rhinorrhea and sore throat. Negative for ear pain and trouble swallowing.   Eyes: Negative for visual disturbance.  Respiratory: Positive for cough. Negative for shortness of breath.   Cardiovascular: Negative for chest pain.  Gastrointestinal: Negative for abdominal pain, diarrhea and vomiting.  Neurological: Positive for headaches (Only with coughing). Negative for dizziness, weakness and numbness.  All other systems reviewed and are negative.   Physical Exam Updated Vital Signs BP 122/84 (BP Location: Right Arm)   Pulse (!) 112   Temp 98.5 F (36.9 C) (Oral)   Resp 17   Ht 6' (1.829 m)   Wt 79.4 kg (175 lb)  SpO2 100%   BMI 23.73 kg/m   Physical Exam  Constitutional: He appears well-developed and well-nourished.  Non-toxic appearance. No distress.  HENT:  Head: Normocephalic and atraumatic.  Right Ear: Tympanic membrane is not perforated, not erythematous and not retracted.  Left Ear: Tympanic membrane is not perforated, not erythematous and not retracted.  Nose: Mucosal edema present. Right sinus exhibits no maxillary sinus tenderness and no frontal sinus tenderness. Left sinus exhibits no maxillary sinus tenderness and no frontal sinus  tenderness.  Mouth/Throat: Uvula is midline and oropharynx is clear and moist. No trismus in the jaw. No uvula swelling. No oropharyngeal exudate or posterior oropharyngeal erythema.  Ears: non-impacting cerumen present in bilateral EACs  Eyes: Conjunctivae and EOM are normal. Pupils are equal, round, and reactive to light. Right eye exhibits no discharge. Left eye exhibits no discharge.  Neck: Normal range of motion. Neck supple.  Cardiovascular: Normal rate and regular rhythm.  No murmur heard. Pulmonary/Chest: Effort normal and breath sounds normal. No respiratory distress. He has no wheezes. He has no rales.  Abdominal: Soft. He exhibits no distension. There is no tenderness.  Lymphadenopathy:    He has no cervical adenopathy.  Neurological: He is alert.  Clear speech.   Skin: Skin is warm and dry. No rash noted.  Psychiatric: He has a normal mood and affect. His behavior is normal.  Nursing note and vitals reviewed.  ED Treatments / Results  Labs (all labs ordered are listed, but only abnormal results are displayed) Labs Reviewed - No data to display  EKG  EKG Interpretation None      Radiology No results found.  Procedures Procedures (including critical care time)  Medications Ordered in ED Medications  ibuprofen (ADVIL,MOTRIN) tablet 800 mg (800 mg Oral Given 06/24/17 2351)    Initial Impression / Assessment and Plan / ED Course  I have reviewed the triage vital signs and the nursing notes.  Pertinent labs & imaging results that were available during my care of the patient were reviewed by me and considered in my medical decision making (see chart for details).    Patient presents with symptoms consistent with viral illness. He is nontoxic appearing, in no apparent distress, vitals WNL other than tachycardia which improved throughout visit. Patient is afebrile and without adventitious sounds on lung exam, doubt PNA. No wheezing on exam. Afebrile, no sinus tenderness,  doubt sinusitis. Centor score 0, doubt strep pharyngitis. Suspect viral etiology, will treat supportively with Ibuprofen, Flonase, and Tessalon. I discussed treatment plan, need for PCP follow-up, and return precautions with the patient. Provided opportunity for questions, patient confirmed understanding and is in agreement with plan.   Vitals:   06/24/17 1933 06/24/17 2008 06/24/17 2349  BP:  122/84   Pulse:  (!) 112 100  Resp:  17   Temp:  98.5 F (36.9 C)   TempSrc:  Oral   SpO2:  100%   Weight: 79.4 kg (175 lb)    Height: 6' (1.829 m)      Final Clinical Impressions(s) / ED Diagnoses   Final diagnoses:  Viral illness    ED Discharge Orders        Ordered    fluticasone (FLONASE) 50 MCG/ACT nasal spray  Daily     06/24/17 2339    ibuprofen (ADVIL,MOTRIN) 800 MG tablet  3 times daily     06/24/17 2339    benzonatate (TESSALON) 100 MG capsule  3 times daily PRN     06/24/17 2339  Cherly Anderson, PA-C 06/25/17 0159    Margarita Grizzle, MD 06/25/17 [phone]

## 2017-06-24 NOTE — Progress Notes (Signed)
Recreation Therapy Notes  INPATIENT RECREATION TR PLAN  Patient Details Name: John Copeland MRN: [id number] DOB: [birth year] Today's Date: 06/24/2017  Rec Therapy Plan Is patient appropriate for Therapeutic Recreation?: Yes Treatment times per week: about 3 days Estimated Length of Stay: 5-7 days TR Treatment/Interventions: Group participation (Comment)  Discharge Criteria Pt will be discharged from therapy if:: Discharged Treatment plan/goals/alternatives discussed and agreed upon by:: Patient/family  Discharge Summary Short term goals set: Pt will identify at least 3 triggers for anger. Short term goals met: Not met Progress toward goals comments: Groups attended Which groups?: Coping skills, Goal setting Reason goals not met: Pt did not participate in groups. Therapeutic equipment acquired: N/A Reason patient discharged from therapy: Discharge from hospital Pt/family agrees with progress & goals achieved: Yes Date patient discharged from therapy: 06/24/17   Victorino Sparrow, LRT/CTRS   Ria Comment, Ortencia Askari A 06/24/2017, 11:03 AM

## 2017-06-24 NOTE — Discharge Instructions (Signed)
You were seen in the emergency today and diagnosed with a viral illness.  I have prescribed you multiple medications to treat your symptoms.   -Flonase to be used 1 spray in each nostril daily.  This medication is used to treat your congestion.  -Tessalon can be taken once every 8 hours as needed.  This medication is used to treat your cough.  -Ibuprofen to be taken once every 8 hours as needed for pain.  You will need to follow-up with your primary care provider in 1 week if your symptoms have not improved.  If you do not have a primary care provider one is provided in your discharge instructions.  Return to the emergency department for any new or worsening symptoms including but not limited to fever not improved with tylenol or motrin, difficulty breathing, chest pain, or passing out.

## 2017-06-24 NOTE — ED Triage Notes (Signed)
Called for vitals X3 with no answer. Pt is on cellphone

## 2017-06-24 NOTE — Progress Notes (Addendum)
CSW spoke with pt's guardian, who stated that pt was refusing to attend group home. Pt has also been refusing PPD and labs despite encouragement from staff. Pt's guardian feels comfortable with pt discharging to local shelter but reports that [facility]. CSW contacted [facility], confirming that they have 3 male beds for this evening. CSW contacted [facility] requesting that they transport pt to shelter. Per receptionist, a team member will call when they get into the office. Per Dr. [place]e [place] MD, pt will discharge to shelter today.   Trula SladeHeather Smart, MSW, LCSW Clinical Social Worker 06/24/2017 8:35 AM   Shanda BumpsJessica from [facility] called back and stated that they will pick him up this afternoon to [facility]. CSW requested that they come as early as possible per instruction of shelter (first come first serve). CSW spoke with pt's guardian who is unable to transport him currently and is concerned that pt will give ACT team a hard time today. Per MD, pt is mentally stable for discharge today.   Trula SladeHeather Smart, MSW, LCSW Clinical Social Worker 06/24/2017 10:56 AM
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
## 2017-06-24 NOTE — Progress Notes (Signed)
Patient ID: John [facility], male   DOB: January 08, 1991, 27 y.o.   MRN: [id number]  D: Patient in room on approach. Pt appears guarded and suspious of his environment.  Pt mood and affect is irritable and labile. Pt denies SI/HI/AVH and pain. Cooperative with assessment. No acute distressed noted at this time.  A: Medications administered as prescribed. Support and encouragement provided to attend groups and engage in milieu. Pt encouraged to discuss feelings and come to staff with any question or concerns.  R: Patient remains safe and complaint with medications.

## 2017-06-24 NOTE — Plan of Care (Signed)
Pt attended coping skills group but did not participate in identifying triggers or coping skills.   Caroll RancherMarjette Cadell Gabrielson, LRT/CTRS

## 2017-06-24 NOTE — Progress Notes (Signed)
Recreation Therapy Notes  Date: 06/24/17 Time: 1000 Location: 500 Hall Dayroom  Group Topic: Leisure Education, Goal Setting  Goal Area(s) Addresses:  Patient will be able to identify at least 3 goals.   Patient will be able to identify benefit of investing in goals.  Patient will be able to identify benefit of setting goals.   Behavioral Response:  None  Intervention: Worksheet  Activity: Goal Planning.  Patients were given a worksheet in which they were to identify a goal they wanted to complete within the next week, next month, next year and in five years.  Patients were to then identify the obstacles that are preventing them from reaching their goals, what they need to achieve their goals and what they could start doing today to work towards their goals.  Education:  Discharge Planning, PharmacologistCoping Skills, Leisure Education   Education Outcome: Acknowledges Education/In Group Clarification Provided/Needs Additional Education  Clinical Observations: Pt did not participate sat for a while before making a cup of coffee.     Caroll RancherMarjette Skyann Ganim, LRT/CTRS    Caroll RancherLindsay, Andres Vest A 06/24/2017 12:32 PM

## 2017-06-24 NOTE — Progress Notes (Signed)
Patient ID: John Copeland, male   DOB: [birth year], 27 y.o.   MRN: [id number] Pt refused am lab draw.

## 2017-06-24 NOTE — ED Notes (Signed)
Provider at bedside

## 2017-06-24 NOTE — Progress Notes (Addendum)
CSW contacted [facility] for a third time to find out when they are coming to pick up patient-per Fortunato CurlingAlicia Willaims, they are now saying that they cannot transport patient at all today. CSW and MD contacted pt's guardian who feels comfortable with pt discharging to shelter by bus. CSW reviewed bus schedule with pt and provided with with [place] and [place] bus passes. MD and CSW reiderated the importance of pt going directly to shelter to check-in, in order to secure a bed for tonight and instructed him to call his guardian at check in and his ACT team in the morning.   Trula SladeHeather Smart, MSW, LCSW Clinical Social Worker 06/24/2017 3:31 PM

## 2017-06-24 NOTE — Progress Notes (Addendum)
  [facility] :  Will you be returning to the same living situation after discharge:  No. Pt unwilling to attend group home; pt [facility].  At discharge, do you have transportation home?: Yes,  [facility] will pick him up today. Do you have the ability to pay for your medications: Yes,  [place] [employer]  Release of information consent forms completed and submitted to medical records by CSW.   Patient to Follow up at: Follow-up Information    Services, Psychotherapeutic Follow up on 06/24/2017.   Why:  Please contact ACT team on Tuesday 2/12. You will resume ACT services at discharge. CSW spoke with Jessica-they are unable to transport you to shelter and requested that you call in the morning to check-in.  Contact information: [address] [place] [id number] [phone]           Next level of care provider [facility]:no  Safety Planning and Suicide Prevention discussed: Yes,  SPE completed with pt's guardian. [facility] pamphlet and Mobile Crisis information provided to pt.   Have you used any form of tobacco in the last 30 days? (Cigarettes, Smokeless Tobacco, Cigars, and/or Pipes): Patient Refused Screening  Has patient been referred to the Quitline?: N/A patient is not a smoker  Patient has been referred for addiction treatment: Yes  Pulte HomesHeather N Smart, LCSW 06/24/2017, 3:29 PM

## 2017-06-24 NOTE — Progress Notes (Signed)
Pt d/c from the hospital with a bus pass. All items returned. D/C instructions given and prescriptions given. Pt denies si and hi. 

## 2017-06-25 ENCOUNTER — Other Ambulatory Visit: Payer: Self-pay

## 2017-06-25 ENCOUNTER — Emergency Department (HOSPITAL_COMMUNITY): Payer: Medicaid Other

## 2017-06-25 ENCOUNTER — Encounter (HOSPITAL_COMMUNITY): Payer: Self-pay

## 2017-06-25 ENCOUNTER — Inpatient Hospital Stay (HOSPITAL_COMMUNITY)
Admission: EM | Admit: 2017-06-25 | Discharge: 2017-06-26 | DRG: 871 | Discharge status: Against Medical Advice | Payer: Medicaid Other | Attending: Internal Medicine | Admitting: Internal Medicine

## 2017-06-25 DIAGNOSIS — F209 Schizophrenia, unspecified: Secondary | ICD-10-CM | POA: Diagnosis present

## 2017-06-25 DIAGNOSIS — E1069 Type 1 diabetes mellitus with other specified complication: Secondary | ICD-10-CM | POA: Diagnosis not present

## 2017-06-25 DIAGNOSIS — Z590 Homelessness: Secondary | ICD-10-CM

## 2017-06-25 DIAGNOSIS — Z888 Allergy status to other drugs, medicaments and biological substances status: Secondary | ICD-10-CM

## 2017-06-25 DIAGNOSIS — J181 Lobar pneumonia, unspecified organism: Secondary | ICD-10-CM | POA: Diagnosis present

## 2017-06-25 DIAGNOSIS — D509 Iron deficiency anemia, unspecified: Secondary | ICD-10-CM | POA: Diagnosis present

## 2017-06-25 DIAGNOSIS — A419 Sepsis, unspecified organism: Secondary | ICD-10-CM | POA: Diagnosis present

## 2017-06-25 DIAGNOSIS — Z79899 Other long term (current) drug therapy: Secondary | ICD-10-CM | POA: Diagnosis not present

## 2017-06-25 DIAGNOSIS — F17210 Nicotine dependence, cigarettes, uncomplicated: Secondary | ICD-10-CM | POA: Diagnosis present

## 2017-06-25 DIAGNOSIS — E119 Type 2 diabetes mellitus without complications: Secondary | ICD-10-CM

## 2017-06-25 DIAGNOSIS — F419 Anxiety disorder, unspecified: Secondary | ICD-10-CM | POA: Diagnosis present

## 2017-06-25 DIAGNOSIS — G4700 Insomnia, unspecified: Secondary | ICD-10-CM | POA: Diagnosis present

## 2017-06-25 DIAGNOSIS — I10 Essential (primary) hypertension: Secondary | ICD-10-CM | POA: Diagnosis present

## 2017-06-25 DIAGNOSIS — F909 Attention-deficit hyperactivity disorder, unspecified type: Secondary | ICD-10-CM | POA: Diagnosis present

## 2017-06-25 DIAGNOSIS — F250 Schizoaffective disorder, bipolar type: Secondary | ICD-10-CM | POA: Diagnosis present

## 2017-06-25 DIAGNOSIS — J189 Pneumonia, unspecified organism: Secondary | ICD-10-CM

## 2017-06-25 LAB — BASIC METABOLIC PANEL
Anion gap: 12 (ref 5–15)
BUN: 7 mg/dL (ref 6–20)
CO2: 27 mmol/L (ref 22–32)
CREATININE: 0.94 mg/dL (ref 0.61–1.24)
Calcium: 10 mg/dL (ref 8.9–10.3)
Chloride: 101 mmol/L (ref 101–111)
GFR calc Af Amer: >60 mL/min (ref >60)
Glucose, Bld: 64 mg/dL — ABNORMAL LOW (ref 65–99)
POTASSIUM: 4.2 mmol/L (ref 3.5–5.1)
SODIUM: 140 mmol/L (ref 135–145)

## 2017-06-25 LAB — CBC WITH DIFFERENTIAL/PLATELET
BASOS PCT: 0 %
Basophils Absolute: 0 10*3/uL (ref 0.0–0.1)
EOS ABS: 0 10*3/uL (ref 0.0–0.7)
Eosinophils Relative: 0 %
HCT: 38.9 % — ABNORMAL LOW (ref 39.0–52.0)
Hemoglobin: 12.8 g/dL — ABNORMAL LOW (ref 13.0–17.0)
Lymphocytes Relative: 10 %
Lymphs Abs: 2.1 10*3/uL (ref 0.7–4.0)
MCH: 23.8 pg — AB (ref 26.0–34.0)
MCHC: 32.9 g/dL (ref 30.0–36.0)
MCV: 72.4 fL — ABNORMAL LOW (ref 78.0–100.0)
MONO ABS: 1 10*3/uL (ref 0.1–1.0)
Monocytes Relative: 5 %
NEUTROS PCT: 85 %
Neutro Abs: 17.6 10*3/uL — ABNORMAL HIGH (ref 1.7–7.7)
PLATELETS: 389 10*3/uL (ref 150–400)
RBC: 5.37 MIL/uL (ref 4.22–5.81)
RDW: 13.9 % (ref 11.5–15.5)
WBC: 20.7 10*3/uL — AB (ref 4.0–10.5)

## 2017-06-25 LAB — I-STAT CG4 LACTIC ACID, ED
Lactic Acid, Venous: 0.85 mmol/L (ref 0.5–1.9)
Lactic Acid, Venous: 3.41 mmol/L (ref 0.5–1.9)

## 2017-06-25 LAB — URINALYSIS, ROUTINE W REFLEX MICROSCOPIC
Bilirubin Urine: NEGATIVE
GLUCOSE, UA: NEGATIVE mg/dL
HGB URINE DIPSTICK: NEGATIVE
Ketones, ur: NEGATIVE mg/dL
Leukocytes, UA: NEGATIVE
Nitrite: NEGATIVE
Protein, ur: NEGATIVE mg/dL
SPECIFIC GRAVITY, URINE: 1.006 (ref 1.005–1.030)
pH: 7 (ref 5.0–8.0)

## 2017-06-25 LAB — INFLUENZA PANEL BY PCR (TYPE A & B)
Influenza A By PCR: NEGATIVE
Influenza B By PCR: NEGATIVE

## 2017-06-25 MED ORDER — SODIUM CHLORIDE 0.9 % IV BOLUS (SEPSIS)
1000.0000 mL | Freq: Once | INTRAVENOUS | Status: AC
  Administered 2017-06-25: 1000 mL via INTRAVENOUS

## 2017-06-25 MED ORDER — IBUPROFEN 400 MG PO TABS
600.0000 mg | ORAL_TABLET | Freq: Once | ORAL | Status: AC
  Administered 2017-06-25: 600 mg via ORAL
  Filled 2017-06-25: qty 1

## 2017-06-25 MED ORDER — ACETAMINOPHEN 500 MG PO TABS
1000.0000 mg | ORAL_TABLET | Freq: Once | ORAL | Status: AC
  Administered 2017-06-25: 1000 mg via ORAL
  Filled 2017-06-25: qty 2

## 2017-06-25 MED ORDER — SODIUM CHLORIDE 0.9 % IV SOLN
500.0000 mg | INTRAVENOUS | Status: DC
  Administered 2017-06-25: 500 mg via INTRAVENOUS
  Filled 2017-06-25: qty 500

## 2017-06-25 MED ORDER — SODIUM CHLORIDE 0.9 % IV SOLN
1.0000 g | INTRAVENOUS | Status: DC
  Administered 2017-06-25: 1 g via INTRAVENOUS
  Filled 2017-06-25: qty 10

## 2017-06-25 NOTE — ED Triage Notes (Signed)
Pt presents with generalized pain, productive cough "for a while".  Pt reports "[facility]".  Pt reports he is cold "inside and out".  Pt reports pain "everywhere".

## 2017-06-25 NOTE — ED Provider Notes (Signed)
[facility] Provider Note   CSN: [id number] Arrival date & time: 06/25/17  1847     History   Chief Complaint No chief complaint on file.   HPI John Copeland is a 27 y.o. male.  HPI John Copeland is a 27 y.o. male with a hx of DM, anxiety, tobacco abuse HTN, and schizophrenia who arrives to the ED via EMS complaining of URI sxs for the past 1 month. State he is having congestion, rhinorrhea, sore throat, and cough productive of green mucous sputum. Also having headache only when coughing, otherwise none. Has tried Robitussin without relief.  States sxs worse in the AM when he wakes up with the congestion. No other alleviating/aggravating factors.  Patient reports yellow-green sputum production that is worsening.  Reports pleuritic chest pain.  Difficult to take a deep breath due to the pain.  Patient denies any history of IV drug use.  Denies any high risk sexual behavior.  It was seen yesterday in the ED and diagnosed with a viral illness and discharged.  Patient states that his symptoms are progressively worsened.  Pt denies any vision changes, lightheadedness, dizziness, congestion, neck pain, abd pain, n/v/d, urinary symptoms, change in bowel habits, melena, hematochezia, lower extremity paresthesias.  Past Medical History:  Diagnosis Date  . Anxiety   . Depression   . Diabetes mellitus without complication (HCC)   . Homelessness   . Hypertension   . Schizophrenia [facility])     Patient Active Problem List   Diagnosis Date Noted  . Accidental drug overdose   . Schizophrenia (HCC) 03/29/2017  . 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 Diphenhydramine overdose of undetermined intent 01/10/2017  . Tobacco use disorder 10/07/2016  . Diabetes (HCC) 10/07/2016  . HTN (hypertension) 10/07/2016  . Dyslipidemia 10/07/2016  . Schizoaffective disorder, bipolar type (HCC) 11/30/2015    History reviewed. No pertinent surgical history.     Home  Medications    Prior to Admission medications   Medication Sig Start Date End Date Taking? Authorizing Provider  ARIPiprazole (ABILIFY) 15 MG tablet Take 1 tablet (15 mg total) by mouth daily. For mood control 06/25/17   Armandina Stammer I, NP  atomoxetine (STRATTERA) 18 MG capsule Take 1 capsule (18 mg total) by mouth daily. For ADHD 06/25/17   Armandina Stammer I, NP  benzonatate (TESSALON) 100 MG capsule Take 1 capsule (100 mg total) by mouth 3 (three) times daily as needed for cough. 06/24/17   Petrucelli, Samantha R, PA-C  divalproex (DEPAKOTE) 500 MG DR tablet Take 1 tablet (500 mg total) by mouth every 12 (twelve) hours. For mood stabilization 06/24/17   Nwoko, Nicole Kindred I, NP  fluticasone (FLONASE) 50 MCG/ACT nasal spray Place 1 spray into both nostrils daily. 06/24/17   Petrucelli, Samantha R, PA-C  ibuprofen (ADVIL,MOTRIN) 800 MG tablet Take 1 tablet (800 mg total) by mouth 3 (three) times daily. 06/24/17   Petrucelli, Samantha R, PA-C  traZODone (DESYREL) 50 MG tablet Take 1 tablet (50 mg total) by mouth at bedtime as needed for sleep. 06/24/17   Sanjuana Kava, NP    Family History Family History  Problem Relation Age of Onset  . Diabetes Other     Social History Social History   Tobacco Use  . Smoking status: Current Every Day Smoker    Packs/day: 0.00    Types: Cigarettes  . Smokeless tobacco: Never Used  Substance Use Topics  . Alcohol use: No  . Drug use: Yes  Types: Marijuana    Comment: denies drug use     Allergies   Haldol [haloperidol lactate]   Review of Systems Review of Systems  Constitutional: Positive for chills and fever.  HENT: Positive for congestion, sinus pressure and sinus pain. Negative for sore throat.   Eyes: Negative for visual disturbance.  Respiratory: Positive for cough and shortness of breath.   Cardiovascular: Negative for chest pain.  Gastrointestinal: Negative for abdominal pain, diarrhea, nausea and vomiting.  Genitourinary: Negative for  dysuria, flank pain, frequency, hematuria, scrotal swelling, testicular pain and urgency.  Musculoskeletal: Negative for arthralgias and myalgias.  Skin: Negative for rash.  Neurological: Negative for dizziness, syncope, weakness, light-headedness, numbness and headaches.  Psychiatric/Behavioral: Negative for sleep disturbance. The patient is not nervous/anxious.      Physical Exam Updated Vital Signs BP 126/69   Pulse (!) 111   Temp (!) 102.4 F (39.1 C) (Oral)   Resp 20   SpO2 97%   Physical Exam  Constitutional: He is oriented to person, place, and time. He appears well-developed and well-nourished.  Non-toxic appearance. He appears ill. No distress.  HENT:  Head: Normocephalic and atraumatic.  Right Ear: Tympanic membrane, external ear and ear canal normal.  Left Ear: Tympanic membrane, external ear and ear canal normal.  Nose: Mucosal edema and rhinorrhea present.  Mouth/Throat: Uvula is midline, oropharynx is clear and moist and mucous membranes are normal.  Eyes: Conjunctivae are normal. Pupils are equal, round, and reactive to light. Right eye exhibits no discharge. Left eye exhibits no discharge.  Neck: Normal range of motion. Neck supple.  Cardiovascular: Regular rhythm, normal heart sounds and intact distal pulses. Exam reveals no gallop and no friction rub.  No murmur heard. TACHYCARDIA  Pulmonary/Chest: Effort normal. No stridor. Tachypnea noted. No respiratory distress. He has no decreased breath sounds. He has no wheezes. He has rhonchi. He has no rales. He exhibits no tenderness.  Abdominal: Soft. Bowel sounds are normal. He exhibits no distension. There is no tenderness. There is no rebound and no guarding.  Musculoskeletal: Normal range of motion. He exhibits no tenderness.  Lymphadenopathy:    He has no cervical adenopathy.  Neurological: He is alert and oriented to person, place, and time.  Skin: Skin is warm and dry. Capillary refill takes less than 2 seconds.  No rash noted.  Psychiatric: His behavior is normal. Judgment and thought content normal.  Nursing note and vitals reviewed.    ED Treatments / Results  Labs (all labs ordered are listed, but only abnormal results are displayed) Labs Reviewed  CBC WITH DIFFERENTIAL/PLATELET - Abnormal; Notable for the following components:      Result Value   WBC 20.7 (*)    Hemoglobin 12.8 (*)    HCT 38.9 (*)    MCV 72.4 (*)    MCH 23.8 (*)    Neutro Abs 17.6 (*)    All other components within normal limits  BASIC METABOLIC PANEL - Abnormal; Notable for the following components:   Glucose, Bld 64 (*)    All other components within normal limits  I-STAT CG4 LACTIC ACID, ED - Abnormal; Notable for the following components:   Lactic Acid, Venous 3.41 (*)    All other components within normal limits  CULTURE, BLOOD (ROUTINE X 2)  CULTURE, BLOOD (ROUTINE X 2)  INFLUENZA PANEL BY PCR (TYPE A & B)  URINALYSIS, ROUTINE W REFLEX MICROSCOPIC    EKG  EKG Interpretation None  Radiology Dg Chest 2 View  Result Date: 06/25/2017 CLINICAL DATA:  Pleuritic chest pain. EXAM: CHEST  2 VIEW COMPARISON:  05/28/2017 FINDINGS: Ill-defined airspace opacity at the left lung base is likely in the left lower lobe and suspicious for early pneumonia. The lungs appear otherwise clear. Cardiac and mediastinal margins appear normal. No pleural effusion. IMPRESSION: 1. Indistinct left lower lobe airspace opacity suspicious for pneumonia. Electronically Signed   By: Gaylyn Rong M.D.   On: 06/25/2017 20:18    Procedures .Critical Care Performed by: Rise Mu, PA-C Authorized by: Rise Mu, PA-C   Critical care provider statement:    Critical care time (minutes):  50   Critical care was necessary to treat or prevent imminent or life-threatening deterioration of the following conditions:  Sepsis   Critical care was time spent personally by me on the following activities:  Development  of treatment plan with patient or surrogate, discussions with consultants, discussions with primary provider, evaluation of patient's response to treatment, examination of patient, obtaining history from patient or surrogate, ordering and performing treatments and interventions, ordering and review of laboratory studies, ordering and review of radiographic studies, pulse oximetry, re-evaluation of patient's condition and review of old charts   (including critical care time)  Medications Ordered in ED Medications  sodium chloride 0.9 % bolus 1,000 mL (not administered)    And  sodium chloride 0.9 % bolus 1,000 mL (not administered)    And  sodium chloride 0.9 % bolus 1,000 mL (not administered)  acetaminophen (TYLENOL) tablet 1,000 mg (1,000 mg Oral Given 06/25/17 1902)  ibuprofen (ADVIL,MOTRIN) tablet 600 mg (600 mg Oral Given 06/25/17 2128)     Initial Impression / Assessment and Plan / ED Course  I have reviewed the triage vital signs and the nursing notes.  Pertinent labs & imaging results that were available during my care of the patient were reviewed by me and considered in my medical decision making (see chart for details).     Patient presents to the ED for evaluation of productive cough, fevers, chills, URI symptoms.  Patient seen yesterday and diagnosed with a viral illness.  Patient return to the ED with febrile today.  Reports thick sputum production.    Patient tachycardic and febrile in the ED.  Mildly tachypneic.  Patient satting at 97% on room air.  Patient treated with Tylenol in the lobby however his fever has worsened eval.  Patient with rhonchi to the left lower lobe.  Decreased breath sounds throughout.  Tachycardia noted.  No focal abdominal tenderness.  No lower extremity edema.  Nasal congestion noted.  Productive cough noted.  Patient noted to have a leukocytosis of 20,000.  Lactic acid is 3.4.  Elect lites are reassuring.  Influenza pending at this time.  UA and  blood cultures pending at this time.  Given patient's vital signs, leukocytosis and elevated lactic acids code sepsis was initiated.  Patient treated for community-acquired pneumonia.  Patient given 30 cc/kg fluid bolus.  Additional Motrin given for fever.  We will consult hospital medicine for admission.  Medicine agrees to admission will see patient in the ED and place admission orders.  Patient remains hemodynamically stable this time.  Final Clinical Impressions(s) / ED Diagnoses   Final diagnoses:  Sepsis, due to unspecified organism [facility])  Community acquired pneumonia of left lower lobe of lung [facility])    ED Discharge Orders    None       Wallace Keller 06/25/17  2157    Gwyneth SproutPlunkett, Whitney, MD 06/25/17 2210

## 2017-06-25 NOTE — H&P (Signed)
[facility]                                                                                    Patient Demographics  John Copeland, is a 27 y.o. male  CSN: [id number]  MRN: [id number]  DOB - 1990/08/31  Admit Date - 06/25/2017  Outpatient Primary MD for the patient is Patient, No Pcp Per   With History of -  Past Medical History:  Diagnosis Date  . Anxiety   . Depression   . Diabetes mellitus without complication (HCC)   . Homelessness   . Hypertension   . Schizophrenia (HCC)       History reviewed. No pertinent surgical history.  in for   Chief Complaint  Patient presents with  . Pneumonia     HPI  John Copeland  is a 27 y.o. male, with past medical history significant for diabetes mellitus, schizophrenia and bipolar disorder presenting today with few days off chest congestion, productive cough, fever and chills. It started worsening lately and he was brought here by his pastor. Patient denies any history of nausea vomiting or diarrhea. His chest x-ray showed left lower lobe infiltrate.    Review of Systems    In addition to the HPI above,  No Headache, No changes with Vision or hearing, No problems swallowing food or Liquids, No Abdominal pain, No Nausea or Vommitting, Bowel movements are regular, No Blood in stool or Urine, No dysuria, No new skin rashes or bruises, No new joints pains-aches,  No new weakness, tingling, numbness in any extremity, No recent weight gain or loss, No polyuria, polydypsia or polyphagia, No significant Mental Stressors.  A full 10 point Review of Systems was done, except as stated above, all other Review of Systems were negative.   Social History Social History   Tobacco Use  . Smoking status: Current Every Day Smoker    Packs/day: 0.00    Types: Cigarettes  . Smokeless tobacco: Never Used  Substance Use Topics  . Alcohol use: No     Family History Family History  Problem Relation Age of Onset   . Diabetes Other      Prior to Admission medications   Medication Sig Start Date End Date Taking? Authorizing Provider  ARIPiprazole (ABILIFY) 15 MG tablet Take 1 tablet (15 mg total) by mouth daily. For mood control 06/25/17   Armandina Stammer I, NP  atomoxetine (STRATTERA) 18 MG capsule Take 1 capsule (18 mg total) by mouth daily. For ADHD 06/25/17   Armandina Stammer I, NP  benzonatate (TESSALON) 100 MG capsule Take 1 capsule (100 mg total) by mouth 3 (three) times daily as needed for cough. 06/24/17   Petrucelli, Samantha R, PA-C  divalproex (DEPAKOTE) 500 MG DR tablet Take 1 tablet (500 mg total) by mouth every 12 (twelve) hours. For mood stabilization 06/24/17   Nwoko, Nicole Kindred I, NP  fluticasone (FLONASE) 50 MCG/ACT nasal spray Place 1 spray into both nostrils daily. 06/24/17   Petrucelli, Samantha R, PA-C  ibuprofen (ADVIL,MOTRIN) 800 MG tablet Take 1 tablet (800 mg total) by mouth 3 (three) times daily. 06/24/17   Petrucelli, Pleas Koch, PA-C  traZODone (DESYREL)  50 MG tablet Take 1 tablet (50 mg total) by mouth at bedtime as needed for sleep. 06/24/17   Sanjuana Kava, NP    Allergies  Allergen Reactions  . Haldol [Haloperidol Lactate] Other (See Comments)    Makes him feel slow    Physical Exam  Vitals  Blood pressure 125/61, pulse (!) 114, temperature (!) 102.4 F (39.1 C), temperature source Oral, resp. rate (!) 28, SpO2 92 %.   1. General looks acutely ill  2. Normal affect and insight, Not Suicidal or Homicidal, Awake Alert, Oriented X 3.  3. No F.N deficits, grossly, patient moving all extremities.  4. Ears and Eyes appear Normal, Conjunctivae clear, PERRLA. Moist Oral Mucosa.  5. Supple Neck, No JVD, No cervical lymphadenopathy appriciated, No Carotid Bruits.  6. Symmetrical Chest wall movement, decreased breath sounds at the bases with mild scattered rhonchi.  7. RRR, No Gallops, Rubs or Murmurs, No Parasternal Heave.  8. Positive Bowel Sounds, Abdomen Soft, Non tender, No  organomegaly appriciated,No rebound -guarding or rigidity.  9.  No Cyanosis, Normal Skin Turgor, No Skin Rash or Bruise.  10. Good muscle tone,  joints appear normal , no effusions, Normal ROM.    Data Review  CBC Recent Labs  Lab 06/25/17 1858  WBC 20.7*  HGB 12.8*  HCT 38.9*  PLT 389  MCV 72.4*  MCH 23.8*  MCHC 32.9  RDW 13.9  LYMPHSABS 2.1  MONOABS 1.0  EOSABS 0.0  BASOSABS 0.0   ------------------------------------------------------------------------------------------------------------------  Chemistries  Recent Labs  Lab 06/25/17 1858  NA 140  K 4.2  CL 101  CO2 27  GLUCOSE 64*  BUN 7  CREATININE 0.94  CALCIUM 10.0   ------------------------------------------------------------------------------------------------------------------ estimated creatinine clearance is 130.7 mL/min (by C-G formula based on SCr of 0.94 mg/dL). ------------------------------------------------------------------------------------------------------------------ No results for input(s): TSH, T4TOTAL, T3FREE, THYROIDAB in the last 72 hours.  Invalid input(s): FREET3   Coagulation profile No results for input(s): INR, PROTIME in the last 168 hours. ------------------------------------------------------------------------------------------------------------------- No results for input(s): DDIMER in the last 72 hours. -------------------------------------------------------------------------------------------------------------------  Cardiac Enzymes No results for input(s): CKMB, TROPONINI, MYOGLOBIN in the last 168 hours.  Invalid input(s): CK ------------------------------------------------------------------------------------------------------------------ Invalid input(s): POCBNP   ---------------------------------------------------------------------------------------------------------------  Urinalysis    Component Value Date/Time   COLORURINE STRAW (A) 06/09/2017 1755    APPEARANCEUR CLEAR 06/09/2017 1755   LABSPEC 1.010 06/09/2017 1755   PHURINE 5.0 06/09/2017 1755   GLUCOSEU NEGATIVE 06/09/2017 1755   HGBUR NEGATIVE 06/09/2017 1755   BILIRUBINUR NEGATIVE 06/09/2017 1755   KETONESUR NEGATIVE 06/09/2017 1755   PROTEINUR NEGATIVE 06/09/2017 1755   NITRITE NEGATIVE 06/09/2017 1755   LEUKOCYTESUR NEGATIVE 06/09/2017 1755    ----------------------------------------------------------------------------------------------------------------     Imaging results:   Dg Chest 2 View  Result Date: 06/25/2017 CLINICAL DATA:  Pleuritic chest pain. EXAM: CHEST  2 VIEW COMPARISON:  05/28/2017 FINDINGS: Ill-defined airspace opacity at the left lung base is likely in the left lower lobe and suspicious for early pneumonia. The lungs appear otherwise clear. Cardiac and mediastinal margins appear normal. No pleural effusion. IMPRESSION: 1. Indistinct left lower lobe airspace opacity suspicious for pneumonia. Electronically Signed   By: Gaylyn Rong M.D.   On: 06/25/2017 20:18   Dg Chest 2 View  Result Date: 05/28/2017 CLINICAL DATA:  27 year old male with chest pain. EXAM: CHEST  2 VIEW COMPARISON:  Chest radiograph dated 04/08/2017 FINDINGS: The heart size and mediastinal contours are within normal limits. Both lungs are clear. The visualized skeletal structures are unremarkable. IMPRESSION:  No active cardiopulmonary disease. Electronically Signed   By: Elgie CollardArash  Radparvar M.D.   On: 05/28/2017 23:30   Dg Tibia/fibula Left  Result Date: 05/31/2017 CLINICAL DATA:  Leg pain swelling EXAM: LEFT TIBIA AND FIBULA - 2 VIEW COMPARISON:  None. FINDINGS: There is no evidence of fracture or other focal bone lesions. Soft tissue edema is present. No radiopaque foreign body. IMPRESSION: No acute osseous abnormality Electronically Signed   By: [name] PangKim  Fujinaga M.D.   On: 05/31/2017 22:08      Assessment & Plan   1. Left lower lobe community acquired pneumonia 2. History of  schizophrenia 3. Diabetes mellitus  Plan IV Rocephin and Zithromax Neb treatments Mucinex Insulin sliding scale     DVT ProphylaxisLovenox  AM Labs Ordered, also please review Full Orders   Code Status full  Disposition Plan: Home  Time spent in minutes :32 minutes  Condition GUARDED   @SIGNATURE @

## 2017-06-26 ENCOUNTER — Encounter (HOSPITAL_COMMUNITY): Payer: Self-pay

## 2017-06-26 DIAGNOSIS — J181 Lobar pneumonia, unspecified organism: Secondary | ICD-10-CM

## 2017-06-26 DIAGNOSIS — J189 Pneumonia, unspecified organism: Secondary | ICD-10-CM | POA: Diagnosis present

## 2017-06-26 LAB — CBC
HEMATOCRIT: 35.4 % — AB (ref 39.0–52.0)
HEMOGLOBIN: 11.6 g/dL — AB (ref 13.0–17.0)
MCH: 23.8 pg — AB (ref 26.0–34.0)
MCHC: 32.8 g/dL (ref 30.0–36.0)
MCV: 72.5 fL — AB (ref 78.0–100.0)
Platelets: 312 10*3/uL (ref 150–400)
RBC: 4.88 MIL/uL (ref 4.22–5.81)
RDW: 13.8 % (ref 11.5–15.5)
WBC: 27.7 10*3/uL — ABNORMAL HIGH (ref 4.0–10.5)

## 2017-06-26 LAB — STREP PNEUMONIAE URINARY ANTIGEN: Strep Pneumo Urinary Antigen: NEGATIVE

## 2017-06-26 LAB — CBG MONITORING, ED
Glucose-Capillary: 101 mg/dL — ABNORMAL HIGH (ref 65–99)
Glucose-Capillary: 78 mg/dL (ref 65–99)

## 2017-06-26 LAB — GLUCOSE, CAPILLARY
Glucose-Capillary: 116 mg/dL — ABNORMAL HIGH (ref 65–99)
Glucose-Capillary: 115 mg/dL — ABNORMAL HIGH (ref 65–99)

## 2017-06-26 LAB — HIV ANTIBODY (ROUTINE TESTING W REFLEX): HIV SCREEN 4TH GENERATION: NONREACTIVE

## 2017-06-26 MED ORDER — SODIUM CHLORIDE 0.9 % IV SOLN
INTRAVENOUS | Status: DC
  Administered 2017-06-26: 02:00:00 via INTRAVENOUS

## 2017-06-26 MED ORDER — HYDROCODONE-ACETAMINOPHEN 5-325 MG PO TABS: 1.0000 | ORAL_TABLET | ORAL | Status: DC | PRN

## 2017-06-26 MED ORDER — ZOLPIDEM TARTRATE 5 MG PO TABS: 5.0000 mg | ORAL_TABLET | Freq: Every evening | ORAL | Status: DC | PRN

## 2017-06-26 MED ORDER — AZITHROMYCIN 500 MG IV SOLR
500.0000 mg | INTRAVENOUS | Status: DC
  Filled 2017-06-26: qty 500

## 2017-06-26 MED ORDER — ALBUTEROL SULFATE (2.5 MG/3ML) 0.083% IN NEBU
2.5000 mg | INHALATION_SOLUTION | Freq: Four times a day (QID) | RESPIRATORY_TRACT | Status: DC
  Administered 2017-06-26: 2.5 mg via RESPIRATORY_TRACT
  Filled 2017-06-26: qty 3

## 2017-06-26 MED ORDER — ATOMOXETINE HCL 18 MG PO CAPS
18.0000 mg | ORAL_CAPSULE | Freq: Every day | ORAL | Status: DC
  Filled 2017-06-26 (×2): qty 1

## 2017-06-26 MED ORDER — ENOXAPARIN SODIUM 40 MG/0.4ML ~~LOC~~ SOLN
40.0000 mg | SUBCUTANEOUS | Status: DC
  Filled 2017-06-26: qty 0.4

## 2017-06-26 MED ORDER — ONDANSETRON HCL 4 MG PO TABS: 4.0000 mg | ORAL_TABLET | Freq: Four times a day (QID) | ORAL | Status: DC | PRN

## 2017-06-26 MED ORDER — SODIUM CHLORIDE 0.9 % IV SOLN
1.0000 g | INTRAVENOUS | Status: DC
  Filled 2017-06-26: qty 10

## 2017-06-26 MED ORDER — ACETAMINOPHEN 325 MG PO TABS: 650.0000 mg | ORAL_TABLET | Freq: Four times a day (QID) | ORAL | Status: DC | PRN

## 2017-06-26 MED ORDER — ONDANSETRON HCL 4 MG/2ML IJ SOLN: 4.0000 mg | Freq: Four times a day (QID) | INTRAMUSCULAR | Status: DC | PRN

## 2017-06-26 MED ORDER — GUAIFENESIN ER 600 MG PO TB12
600.0000 mg | ORAL_TABLET | Freq: Two times a day (BID) | ORAL | Status: DC
  Administered 2017-06-26 (×2): 600 mg via ORAL
  Filled 2017-06-26 (×2): qty 1

## 2017-06-26 MED ORDER — IPRATROPIUM-ALBUTEROL 0.5-2.5 (3) MG/3ML IN SOLN: 3.0000 mL | RESPIRATORY_TRACT | Status: DC | PRN

## 2017-06-26 MED ORDER — ALBUTEROL SULFATE (2.5 MG/3ML) 0.083% IN NEBU: 2.5000 mg | INHALATION_SOLUTION | RESPIRATORY_TRACT | Status: DC | PRN

## 2017-06-26 MED ORDER — DIVALPROEX SODIUM 500 MG PO DR TAB
500.0000 mg | DELAYED_RELEASE_TABLET | Freq: Two times a day (BID) | ORAL | Status: DC
  Administered 2017-06-26 (×2): 500 mg via ORAL
  Filled 2017-06-26: qty 1
  Filled 2017-06-26 (×2): qty 2

## 2017-06-26 MED ORDER — TRAZODONE HCL 50 MG PO TABS: 50.0000 mg | ORAL_TABLET | Freq: Every evening | ORAL | Status: DC | PRN

## 2017-06-26 MED ORDER — ARIPIPRAZOLE 5 MG PO TABS
15.0000 mg | ORAL_TABLET | Freq: Every day | ORAL | Status: DC
  Administered 2017-06-26: 12:00:00 15 mg via ORAL
  Filled 2017-06-26: qty 1

## 2017-06-26 MED ORDER — ACETAMINOPHEN 650 MG RE SUPP: 650.0000 mg | Freq: Four times a day (QID) | RECTAL | Status: DC | PRN

## 2017-06-26 MED ORDER — INSULIN ASPART 100 UNIT/ML ~~LOC~~ SOLN: 0.0000 [IU] | Freq: Three times a day (TID) | SUBCUTANEOUS | Status: DC

## 2017-06-26 MED ORDER — FLUTICASONE PROPIONATE 50 MCG/ACT NA SUSP
1.0000 | Freq: Every day | NASAL | Status: DC
  Filled 2017-06-26: qty 16

## 2017-06-26 MED ORDER — INSULIN ASPART 100 UNIT/ML ~~LOC~~ SOLN: 0.0000 [IU] | Freq: Every day | SUBCUTANEOUS | Status: DC
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
## 2017-06-26 NOTE — Progress Notes (Signed)
Patient has left the unit, unaccompanied, walking out on his own free will in stable condition.  P.J. Henderson NewcomerSexton, RN

## 2017-06-26 NOTE — Progress Notes (Addendum)
Patient is insistent to leave AMA, RN explained to patient need for IV antibiotics and patient does not wish to receive any, he voiced he needs to go before the buses stop running and it gets too late.  RN attempted to explain to patient that he has pneumonia and he would benefit from antibiotics and a warm room and patient declined.  RN attempted to assess patient and patient refused.  RN paged K. KaserSofia, [place] to make aware.  PA paged and stated patient has a legal guardian and to make them aware.  RN attempted to notify [name], patient's legal guardian, number has been disconnected.  RN paged PA to make her aware.  P.J. Henderson NewcomerSexton, RN

## 2017-06-26 NOTE — Progress Notes (Signed)
   06/26/17 1600  Clinical Encounter Type  Visited With Patient  Visit Type Psychological support  Referral From Nurse  Consult/Referral To Chaplain  Spiritual Encounters  Spiritual Needs Prayer  Chaplain was asked to come and talk to the PT.  From a previous engagement Chaplain was able to identify with PT.  PT is trying to leave the hospital AMA but is still suffering from sickness.  PT wanted prayer about finding a job, and getting better.  Chaplain prayed with PT, no follow up needed

## 2017-06-27 ENCOUNTER — Emergency Department (HOSPITAL_COMMUNITY)
Admission: EM | Admit: 2017-06-27 | Discharge: 2017-06-27 | Disposition: A | Discharge status: Home / Self Care | Payer: Medicaid Other | Attending: Emergency Medicine | Admitting: Emergency Medicine

## 2017-06-27 ENCOUNTER — Other Ambulatory Visit: Payer: Self-pay

## 2017-06-27 ENCOUNTER — Encounter (HOSPITAL_COMMUNITY): Payer: Self-pay | Admitting: Emergency Medicine

## 2017-06-27 ENCOUNTER — Emergency Department (HOSPITAL_COMMUNITY)
Admission: EM | Admit: 2017-06-27 | Discharge: 2017-06-28 | Disposition: A | Discharge status: Home / Self Care | Payer: Medicaid Other | Source: Home / Self Care | Attending: Emergency Medicine | Admitting: Emergency Medicine

## 2017-06-27 DIAGNOSIS — R197 Diarrhea, unspecified: Secondary | ICD-10-CM | POA: Insufficient documentation

## 2017-06-27 DIAGNOSIS — J40 Bronchitis, not specified as acute or chronic: Secondary | ICD-10-CM

## 2017-06-27 DIAGNOSIS — F17210 Nicotine dependence, cigarettes, uncomplicated: Secondary | ICD-10-CM | POA: Insufficient documentation

## 2017-06-27 DIAGNOSIS — R05 Cough: Secondary | ICD-10-CM | POA: Insufficient documentation

## 2017-06-27 DIAGNOSIS — Z5321 Procedure and treatment not carried out due to patient leaving prior to being seen by health care provider: Secondary | ICD-10-CM | POA: Diagnosis not present

## 2017-06-27 DIAGNOSIS — J209 Acute bronchitis, unspecified: Secondary | ICD-10-CM

## 2017-06-27 DIAGNOSIS — I10 Essential (primary) hypertension: Secondary | ICD-10-CM

## 2017-06-27 DIAGNOSIS — E119 Type 2 diabetes mellitus without complications: Secondary | ICD-10-CM

## 2017-06-27 LAB — LEGIONELLA PNEUMOPHILA SEROGP 1 UR AG: L. PNEUMOPHILA SEROGP 1 UR AG: NEGATIVE

## 2017-06-27 NOTE — ED Notes (Signed)
[facility]

## 2017-06-27 NOTE — ED Provider Notes (Signed)
Patient placed in Quick Look pathway, seen and evaluated   Chief Complaint: Diarrhea  HPI:  Patient states he has had three episodes of non-bloody diarrhea today. Believes it is a side effect of taking robitussin for recent URI. Per chart review patient left AMA from the hospital yesterday after being admitted for LLL CAP. He is homeless.   ROS: denies fever/chills, abdominal pain and vomiting  Physical Exam:   Gen: No distress  Neuro: Awake and Alert  Skin: Warm    Focused Exam: No respiratory distress. Abdomen soft and non-tender   Initiation of care has begun. The patient has been counseled on the process, plan, and necessity for staying for the completion/evaluation, and the remainder of the medical screening examination    Kellie ShropshireShrosbree, Sanjiv Castorena J, PA-C 06/27/17 1948    Kellie ShropshireShrosbree, Haila Dena J, PA-C 06/27/17 2105    Mancel BaleWentz, Elliott, MD 06/28/17 1126

## 2017-06-27 NOTE — ED Triage Notes (Signed)
Pt c/o cold like symptoms with pain, and persistent cough, pt seen multiple time during this week and dc home.

## 2017-06-27 NOTE — Progress Notes (Signed)
CSW called ACTT team, [facility], [phone]. CSW updated on pt's status and informed them of potentially starting job tomorrow at [employer].   ACTT team and Legal guardian are trying to track pt down. Please notify ACTT team and [name] from [place] [facility] if pt returns to ED.   Montine CircleKelsy Dannell Gortney, Silverio LayLCSWA [facility]  [phone]
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
## 2017-06-27 NOTE — Progress Notes (Addendum)
CSW met with pt in pt's room. Pt informed CSW that he does not eat during the day. Pt is homeless and is begging for food.  Pt informed CSW that he was kicked out of [employer]. Pt stated he is starting a job tomorrow at [employer]. CSW called pt's sister, Vaughan Basta, with pt at [phone]. CSW left voicemail for sister.   CSW informed pt that she would reach out to [employer] to see if there is a bed available. No bed available. Pt was kicked out of [employer].     CSW called [place] at [phone]. Pt has a legal guardian through [employer] Jerrye Beavers. CSW informed on call social worker of the situation. On call social worker provided number for Jerrye Beavers [phone].  CSW attempted to provide pt with resources for the [facility]. CSW asked pt about Jerrye Beavers and suggested reaching out to [place] to see if he would be able to help him. Pt refused and denied knowing about Jerrye Beavers, his legal guardian. CSW suggested pt reach out to his pastor to see if pt is able to stay there tonight. Pt refused and became agitated with CSW and left without resources.    Wendelyn Breslow, [facility]  [phone]

## 2017-06-27 NOTE — ED Triage Notes (Signed)
Patient here due to having diarrhea.  He states that he received something via IV for PNA, which he has not been diagnosed having.  [facility] and was not given his meds due to not having his legal guardian with him.

## 2017-06-27 NOTE — ED Notes (Signed)
Pt did not answer for a room and is not visible in the waiting room

## 2017-06-28 ENCOUNTER — Other Ambulatory Visit: Payer: Self-pay

## 2017-06-28 ENCOUNTER — Encounter (HOSPITAL_COMMUNITY): Payer: Self-pay | Admitting: Emergency Medicine

## 2017-06-28 ENCOUNTER — Emergency Department (EMERGENCY_DEPARTMENT_HOSPITAL)
Admission: EM | Admit: 2017-06-28 | Discharge: 2017-06-29 | Disposition: A | Discharge status: Home / Self Care | Payer: Medicaid Other | Source: Home / Self Care | Attending: Emergency Medicine | Admitting: Emergency Medicine

## 2017-06-28 ENCOUNTER — Emergency Department (HOSPITAL_COMMUNITY): Payer: Medicaid Other

## 2017-06-28 ENCOUNTER — Emergency Department (HOSPITAL_COMMUNITY)
Admission: EM | Admit: 2017-06-28 | Discharge: 2017-06-28 | Discharge status: Against Medical Advice | Payer: Medicaid Other | Source: Home / Self Care

## 2017-06-28 DIAGNOSIS — Z9114 Patient's other noncompliance with medication regimen: Secondary | ICD-10-CM

## 2017-06-28 DIAGNOSIS — F99 Mental disorder, not otherwise specified: Secondary | ICD-10-CM

## 2017-06-28 LAB — CBC WITH DIFFERENTIAL/PLATELET
BASOS ABS: 0 10*3/uL (ref 0.0–0.1)
Basophils Relative: 0 %
Eosinophils Absolute: 0.1 10*3/uL (ref 0.0–0.7)
Eosinophils Relative: 1 %
HEMATOCRIT: 37.1 % — AB (ref 39.0–52.0)
HEMOGLOBIN: 12 g/dL — AB (ref 13.0–17.0)
Lymphocytes Relative: 26 %
Lymphs Abs: 3.2 10*3/uL (ref 0.7–4.0)
MCH: 23.4 pg — ABNORMAL LOW (ref 26.0–34.0)
MCHC: 32.3 g/dL (ref 30.0–36.0)
MCV: 72.3 fL — ABNORMAL LOW (ref 78.0–100.0)
Monocytes Absolute: 0.8 10*3/uL (ref 0.1–1.0)
Monocytes Relative: 7 %
NEUTROS ABS: 8.1 10*3/uL — AB (ref 1.7–7.7)
NEUTROS PCT: 66 %
Platelets: 367 10*3/uL (ref 150–400)
RBC: 5.13 MIL/uL (ref 4.22–5.81)
RDW: 14.2 % (ref 11.5–15.5)
WBC: 12.2 10*3/uL — AB (ref 4.0–10.5)

## 2017-06-28 LAB — BASIC METABOLIC PANEL
ANION GAP: 12 (ref 5–15)
BUN: 9 mg/dL (ref 6–20)
CHLORIDE: 103 mmol/L (ref 101–111)
CO2: 23 mmol/L (ref 22–32)
Calcium: 9.6 mg/dL (ref 8.9–10.3)
Creatinine, Ser: 0.95 mg/dL (ref 0.61–1.24)
GFR calc non Af Amer: >60 mL/min (ref >60)
Glucose, Bld: 73 mg/dL (ref 65–99)
POTASSIUM: 4 mmol/L (ref 3.5–5.1)
SODIUM: 138 mmol/L (ref 135–145)

## 2017-06-28 MED ORDER — AMOXICILLIN 500 MG PO CAPS: 500.0000 mg | ORAL_CAPSULE | Freq: Three times a day (TID) | ORAL | 0 refills | Status: DC

## 2017-06-28 MED ORDER — ATOMOXETINE HCL 18 MG PO CAPS: 18.0000 mg | ORAL_CAPSULE | Freq: Every day | ORAL | Status: DC

## 2017-06-28 MED ORDER — ARIPIPRAZOLE 10 MG PO TABS
15.0000 mg | ORAL_TABLET | Freq: Every day | ORAL | Status: DC
  Administered 2017-06-29: 15 mg via ORAL
  Filled 2017-06-28: qty 1

## 2017-06-28 MED ORDER — DIVALPROEX SODIUM 250 MG PO DR TAB
500.0000 mg | DELAYED_RELEASE_TABLET | Freq: Two times a day (BID) | ORAL | Status: DC
  Administered 2017-06-28 – 2017-06-29 (×2): 500 mg via ORAL
  Filled 2017-06-28 (×2): qty 2

## 2017-06-28 MED ORDER — TRAZODONE HCL 50 MG PO TABS
50.0000 mg | ORAL_TABLET | Freq: Every evening | ORAL | Status: DC | PRN
  Administered 2017-06-28: 50 mg via ORAL
  Filled 2017-06-28: qty 1

## 2017-06-28 NOTE — ED Notes (Signed)
Pt returned to waiting room.  Pt states that he is hungry and went looking for food.  Pt is tearful but sitting quitely

## 2017-06-28 NOTE — ED Notes (Signed)
TTS in progress 

## 2017-06-28 NOTE — ED Triage Notes (Signed)
Pt c/o cold for the past few days, nasal congestion. No fever or chills.

## 2017-06-28 NOTE — ED Notes (Signed)
Pt attempting to leave, because "I'm hungry." pt given another sandwich. Requesting to speak to officers that are outside of his room with another patient.

## 2017-06-28 NOTE — ED Notes (Signed)
Sitter at bedside.

## 2017-06-28 NOTE — ED Notes (Signed)
Pt in and out of room to nursing station. Pt reports he feels more hyper and anxious today. Reports being off his medications for the past 3-4 days; [facility] but does not have medication to take. Pt asking for help with his depression, continues to intermittently cry without tears

## 2017-06-28 NOTE — ED Notes (Signed)
Pt refusing care, pt also refuse to change in to burgundy scrubs at this time.

## 2017-06-28 NOTE — ED Triage Notes (Signed)
No answer when ncalled in waiting room x 3

## 2017-06-28 NOTE — ED Notes (Signed)
Pt in the hall yelling out loud at staff, stating he is going to leave. PA notified and will place IVC paperwork

## 2017-06-28 NOTE — ED Notes (Signed)
Pt states he was depressed and RN took shaver and clothes, and razors.

## 2017-06-28 NOTE — ED Notes (Signed)
Pt requesting someone to pray with, chaplain paged

## 2017-06-28 NOTE — ED Notes (Signed)
Pt crying in triage stating "I should've been obedient"

## 2017-06-28 NOTE — Progress Notes (Signed)
Responded to page for this patient who is requesting prayer.  Patient wants prayer for healing and for patience.  We talked and prayed.  Pleasant conversation.      06/28/17 2045  Clinical Encounter Type  Visited With Patient  Visit Type Initial;Psychological support;Spiritual support  Spiritual Encounters  Spiritual Needs Prayer

## 2017-06-28 NOTE — Progress Notes (Signed)
CS[facility] [place] [facility] [facility].  Montine CircleKelsy Trynity Skousen, Silverio LayLCSWA [facility]  [phone]

## 2017-06-28 NOTE — Progress Notes (Addendum)
CSW spoke with John Copeland from [place] [employer]. John Copeland informed CSW that DSS is no longer pt's guardian. Pt's guardian is John Copeland.   CSW called John Copeland after hours at [phone]. (Daytime number is [phone]).   Updated on call person of pt being homeless and multiple visits to the ED. On call person states that nothing can be done tonight. John Copeland stated that they cannot come pick up the pt because that is not what they do. On call person will provide information to pt's assigned legal guardian. Legal guardian will be back in on Monday and can be reached at day time number listed above.   Legal Guardian through John Copeland: John Copeland [phone]. CSW lvm with legal guardian.  CSW [facility] [place] [facility].   740 pm Spoke with ACTT Team at [phone] and spoke with [facility]. Discussed situation at length with ACTT team. ACTT Team and legal guardian have attempted multiple placements, but pt refuses and has been kicked [place] in the past [place]. Pt received disability, but disability at this time is going to his previous group home.   John Copeland, John Copeland [facility]  [phone]
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
## 2017-06-28 NOTE — ED Notes (Signed)
Pt changed into burgundy scrubs °

## 2017-06-28 NOTE — Progress Notes (Addendum)
CSW called and left voice mail with pt's legal guardian, [place] Cop[place] with [place] [facility] [facility] at [phone]. CSW left message indicating that pt requires assistance in finding housing.   CSW called after hours DSS worker of [place] at [phone] to update that pt is here and see if they are able to come get him.   CSW called [place] ACTT team. Notified team that pt is here. ACTT team asking for more information about where pt goes when he leaves the hospital.   Plan: CSW will call back ACTT team and legal guardian if any additional information is obtained.   Update: John Copeland is pt's legal guardian.   John Copeland, John Copeland  [phone]

## 2017-06-28 NOTE — ED Notes (Signed)
Pt in the hall, wanting to leave. Pt advised that he hasn't received all his medications. Pt redirected back to his room, sitter at bedside.

## 2017-06-28 NOTE — ED Provider Notes (Signed)
[name] EMERGENCY DEPARTMENT Provider Note   CSN: [id number] Arrival date & time: 06/27/17  1930     History   Chief Complaint Chief Complaint  Patient presents with  . Diarrhea    HPI John Copeland is a 27 y.o. male.  The history is provided by the patient.  Diarrhea   This is a new problem. The current episode started 6 to 12 hours ago. Episode frequency: once and it has now stopped. The problem has been resolved. The stool consistency is described as watery. There has been no fever. Associated symptoms include cough. Pertinent negatives include no chills. He has tried nothing for the symptoms. The treatment provided significant relief. His past medical history does not include irritable bowel syndrome.  Still has a mild cough and did not get antibiotics as he left AMA from the inpatient service.  No f/c/r.  Eating and drinking has already been in the ED once since leaving AMA.    Past Medical History:  Diagnosis Date  . Anxiety   . Depression   . Diabetes mellitus without complication (HCC)   . Homelessness   . Hypertension   . Schizophrenia [facility])     Patient Active Problem List   Diagnosis Date Noted  . Left lower lobe pneumonia (HCC) 06/26/2017  . Accidental drug overdose   . Schizophrenia (HCC) 03/29/2017  . 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 Diphenhydramine overdose of undetermined intent 01/10/2017  . Tobacco use disorder 10/07/2016  . Diabetes (HCC) 10/07/2016  . HTN (hypertension) 10/07/2016  . Dyslipidemia 10/07/2016  . Schizoaffective disorder, bipolar type (HCC) 11/30/2015    History reviewed. No pertinent surgical history.     Home Medications    Prior to Admission medications   Medication Sig Start Date End Date Taking? Authorizing Provider  amoxicillin (AMOXIL) 500 MG capsule Take 1 capsule (500 mg total) by mouth 3 (three) times daily. 06/28/17   Anamarie Hunn, MD  ARIPiprazole (ABILIFY) 15 MG tablet Take 1 tablet (15 mg  total) by mouth daily. For mood control Patient not taking: Reported on 06/26/2017 06/25/17   Armandina Stammer I, NP  atomoxetine (STRATTERA) 18 MG capsule Take 1 capsule (18 mg total) by mouth daily. For ADHD Patient not taking: Reported on 06/26/2017 06/25/17   Armandina Stammer I, NP  benzonatate (TESSALON) 100 MG capsule Take 1 capsule (100 mg total) by mouth 3 (three) times daily as needed for cough. Patient not taking: Reported on 06/26/2017 06/24/17   Petrucelli, Lelon Mast R, PA-C  divalproex (DEPAKOTE) 500 MG DR tablet Take 1 tablet (500 mg total) by mouth every 12 (twelve) hours. For mood stabilization Patient not taking: Reported on 06/26/2017 06/24/17   Armandina Stammer I, NP  fluticasone (FLONASE) 50 MCG/ACT nasal spray Place 1 spray into both nostrils daily. Patient not taking: Reported on 06/26/2017 06/24/17   Petrucelli, Pleas Koch, PA-C  ibuprofen (ADVIL,MOTRIN) 800 MG tablet Take 1 tablet (800 mg total) by mouth 3 (three) times daily. Patient not taking: Reported on 06/26/2017 06/24/17   Petrucelli, Pleas Koch, PA-C  traZODone (DESYREL) 50 MG tablet Take 1 tablet (50 mg total) by mouth at bedtime as needed for sleep. Patient not taking: Reported on 06/26/2017 06/24/17   Sanjuana Kava, NP    Family History Family History  Problem Relation Age of Onset  . Diabetes Other     Social History Social History   Tobacco Use  . Smoking status: Current Every Day Smoker    Packs/day: 0.00  Types: Cigarettes  . Smokeless tobacco: Never Used  Substance Use Topics  . Alcohol use: No  . Drug use: Yes    Types: Marijuana    Comment: denies drug use     Allergies   Haldol [haloperidol lactate]   Review of Systems Review of Systems  Constitutional: Negative for appetite change, chills, diaphoresis, fatigue and fever.  Respiratory: Positive for cough. Negative for shortness of breath.   Cardiovascular: Negative for chest pain, palpitations and leg swelling.  Gastrointestinal: Positive for  diarrhea.  All other systems reviewed and are negative.    Physical Exam Updated Vital Signs BP (!) 145/74   Pulse 96   Temp 98.6 F (37 C) (Oral)   Resp 18   Ht 6' (1.829 m)   Wt 78.9 kg (174 lb)   SpO2 98%   BMI 23.60 kg/m   Physical Exam  Constitutional: He is oriented to person, place, and time. He appears well-developed and well-nourished. No distress.  HENT:  Head: Normocephalic and atraumatic.  Mouth/Throat: No oropharyngeal exudate.  Eyes: Conjunctivae are normal. Pupils are equal, round, and reactive to light.  Neck: Normal range of motion. Neck supple.  Cardiovascular: Normal rate, regular rhythm, normal heart sounds and intact distal pulses.  Pulmonary/Chest: Effort normal and breath sounds normal. No stridor. No respiratory distress. He has no wheezes. He has no rales.  Abdominal: Soft. Bowel sounds are normal. He exhibits no mass. There is no tenderness. There is no rebound and no guarding. No hernia.  Musculoskeletal: Normal range of motion.  Neurological: He is alert and oriented to person, place, and time. He displays normal reflexes.  Skin: Skin is warm and dry. Capillary refill takes less than 2 seconds.  Nursing note and vitals reviewed.    ED Treatments / Results   Radiology Dg Chest 2 View  Result Date: 06/28/2017 CLINICAL DATA:  Diarrhea EXAM: CHEST  2 VIEW COMPARISON:  06/25/2017 FINDINGS: Borderline cardiomegaly. No aortic aneurysm. Redemonstration of left basilar atelectasis. No overt pulmonary edema, effusion or pneumothorax. No acute osseous abnormality. IMPRESSION: Borderline cardiomegaly with left basilar atelectasis. Electronically Signed   By: Tollie Ethavid  Kwon M.D.   On: 06/28/2017 01:10    Procedures Procedures (including critical care time)  Medications Ordered in ED   Final Clinical Impressions(s) / ED Diagnoses   Final diagnoses:  Diarrhea, unspecified type  Bronchitis   Will place on ABX for PNA seen on cxr of admission as he was  not given RX when he AMAed.    Return for weakness, numbness, changes in vision or speech,  fevers > 100.4 unrelieved by medication, shortness of breath, intractable vomiting, or diarrhea, abdominal pain, Inability to tolerate liquids or food, cough, altered mental status or any concerns. No signs of systemic illness or infection. The patient is nontoxic-appearing on exam and vital signs are within normal limits.    I have reviewed the triage vital signs and the nursing notes. Pertinent labs &imaging results that were available during my care of the patient were reviewed by me and considered in my medical decision making (see chart for details).  After history, exam, and medical workup I feel the patient has been appropriately medically screened and is safe for discharge home. Pertinent diagnoses were discussed with the patient. Patient was given return precautions. ED Discharge Orders        Ordered    amoxicillin (AMOXIL) 500 MG capsule  3 times daily     06/28/17 0133  Alvia Tory, MD 06/28/17 0330

## 2017-06-28 NOTE — ED Notes (Signed)
Pt given turkey sandwich and Sprite 

## 2017-06-29 DIAGNOSIS — F209 Schizophrenia, unspecified: Secondary | ICD-10-CM

## 2017-06-29 DIAGNOSIS — F1290 Cannabis use, unspecified, uncomplicated: Secondary | ICD-10-CM

## 2017-06-29 DIAGNOSIS — F17210 Nicotine dependence, cigarettes, uncomplicated: Secondary | ICD-10-CM

## 2017-06-29 LAB — SALICYLATE LEVEL: Salicylate Lvl: <7 mg/dL (ref 2.8–30.0)

## 2017-06-29 LAB — RAPID URINE DRUG SCREEN, HOSP PERFORMED
AMPHETAMINES: NOT DETECTED
BARBITURATES: NOT DETECTED
BENZODIAZEPINES: NOT DETECTED
COCAINE: NOT DETECTED
Opiates: NOT DETECTED
Tetrahydrocannabinol: NOT DETECTED

## 2017-06-29 LAB — HEPATIC FUNCTION PANEL
ALT: 44 U/L (ref 17–63)
AST: 33 U/L (ref 15–41)
Albumin: 3.7 g/dL (ref 3.5–5.0)
Alkaline Phosphatase: 82 U/L (ref 38–126)
BILIRUBIN TOTAL: 0.6 mg/dL (ref 0.3–1.2)
Total Protein: 7.5 g/dL (ref 6.5–8.1)

## 2017-06-29 LAB — ACETAMINOPHEN LEVEL: Acetaminophen (Tylenol), Serum: <10 ug/mL — ABNORMAL LOW (ref 10–30)

## 2017-06-29 LAB — ETHANOL: Alcohol, Ethyl (B): <10 mg/dL (ref <10)

## 2017-06-29 LAB — URINALYSIS, ROUTINE W REFLEX MICROSCOPIC
BILIRUBIN URINE: NEGATIVE
GLUCOSE, UA: NEGATIVE mg/dL
HGB URINE DIPSTICK: NEGATIVE
Ketones, ur: NEGATIVE mg/dL
Leukocytes, UA: NEGATIVE
Nitrite: NEGATIVE
Protein, ur: NEGATIVE mg/dL
SPECIFIC GRAVITY, URINE: 1.012 (ref 1.005–1.030)
pH: 5 (ref 5.0–8.0)

## 2017-06-29 MED ORDER — ARIPIPRAZOLE 15 MG PO TABS: 15.0000 mg | ORAL_TABLET | Freq: Every day | ORAL | 0 refills | Status: DC

## 2017-06-29 MED ORDER — LORAZEPAM 2 MG/ML IJ SOLN
1.0000 mg | Freq: Once | INTRAMUSCULAR | Status: AC
  Administered 2017-06-29: 1 mg via INTRAMUSCULAR

## 2017-06-29 MED ORDER — LORAZEPAM 2 MG/ML IJ SOLN
INTRAMUSCULAR | Status: AC
  Filled 2017-06-29: qty 1

## 2017-06-29 MED ORDER — DIVALPROEX SODIUM 500 MG PO DR TAB: 500.0000 mg | DELAYED_RELEASE_TABLET | Freq: Two times a day (BID) | ORAL | 0 refills | Status: DC

## 2017-06-29 NOTE — ED Notes (Signed)
[facility], pt has been wand, pt belongings place in locker 4, medication taken to pharmacy, valuables given to security.
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
## 2017-06-29 NOTE — ED Notes (Signed)
Per report, pt refused 0600 VS's to be taken.

## 2017-06-29 NOTE — ED Notes (Signed)
Pt attempting to leave unit, security and [employer] at bedside. EDP aware, orders for ativan IM placed.

## 2017-06-29 NOTE — Progress Notes (Addendum)
Per Leighton Ruffina Okonkwo NP, pt is psychiatrically cleared for discharge.Becky RN notified of disposition. CSW left message for pt's legal guardian [name] at [phone] and [facility] at [phone] to notify them of pt's discharge. CSW contacted Dr. Rodena MedinMessick [phone] to request 7 day supply of medications.   Trula SladeHeather Smart, MSW, LCSW Clinical Social Worker 06/29/2017 11:46 AM

## 2017-06-29 NOTE — ED Provider Notes (Signed)
[name] EMERGENCY DEPARTMENT Provider Note   CSN: [id number] Arrival date & time: 06/28/17  1823     History   Chief Complaint Chief Complaint  Patient presents with  . Nasal Congestion  . Medical Clearance    HPI Johanan Skorupski is a 27 y.o. male with past medical history significant for schizophrenia, anxiety, depression, hypertension, diabetes presenting with complaint of nasal congestion and found to have been non-compliant with medications. Patient is stating that his guardian is taking his checks and he cannot access his medications. He is inconsistent in his history, flight of ideas and unclear exactly what symptoms he is experiencing. He is agitated and shouting at staff in the hall.  HPI  Past Medical History:  Diagnosis Date  . Anxiety   . Depression   . Diabetes mellitus without complication (HCC)   . Homelessness   . Hypertension   . Schizophrenia [facility])     Patient Active Problem List   Diagnosis Date Noted  . Left lower lobe pneumonia (HCC) 06/26/2017  . Accidental drug overdose   . Schizophrenia (HCC) 03/29/2017  . 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 Diphenhydramine overdose of undetermined intent 01/10/2017  . Tobacco use disorder 10/07/2016  . Diabetes (HCC) 10/07/2016  . HTN (hypertension) 10/07/2016  . Dyslipidemia 10/07/2016  . Schizoaffective disorder, bipolar type (HCC) 11/30/2015    History reviewed. No pertinent surgical history.     Home Medications    Prior to Admission medications   Medication Sig Start Date End Date Taking? Authorizing Provider  amoxicillin (AMOXIL) 500 MG capsule Take 1 capsule (500 mg total) by mouth 3 (three) times daily. 06/28/17   Palumbo, April, MD  ARIPiprazole (ABILIFY) 15 MG tablet Take 1 tablet (15 mg total) by mouth daily. For mood control Patient not taking: Reported on 06/26/2017 06/25/17   Armandina Stammer I, NP  atomoxetine (STRATTERA) 18 MG capsule Take 1 capsule (18 mg total) by mouth daily.  For ADHD Patient not taking: Reported on 06/26/2017 06/25/17   Armandina Stammer I, NP  benzonatate (TESSALON) 100 MG capsule Take 1 capsule (100 mg total) by mouth 3 (three) times daily as needed for cough. Patient not taking: Reported on 06/26/2017 06/24/17   Petrucelli, Lelon Mast R, PA-C  divalproex (DEPAKOTE) 500 MG DR tablet Take 1 tablet (500 mg total) by mouth every 12 (twelve) hours. For mood stabilization Patient not taking: Reported on 06/26/2017 06/24/17   Armandina Stammer I, NP  fluticasone (FLONASE) 50 MCG/ACT nasal spray Place 1 spray into both nostrils daily. Patient not taking: Reported on 06/26/2017 06/24/17   Petrucelli, Pleas Koch, PA-C  ibuprofen (ADVIL,MOTRIN) 800 MG tablet Take 1 tablet (800 mg total) by mouth 3 (three) times daily. Patient not taking: Reported on 06/26/2017 06/24/17   Petrucelli, Pleas Koch, PA-C  traZODone (DESYREL) 50 MG tablet Take 1 tablet (50 mg total) by mouth at bedtime as needed for sleep. Patient not taking: Reported on 06/26/2017 06/24/17   Sanjuana Kava, NP    Family History Family History  Problem Relation Age of Onset  . Diabetes Other     Social History Social History   Tobacco Use  . Smoking status: Current Every Day Smoker    Packs/day: 0.00    Types: Cigarettes  . Smokeless tobacco: Never Used  Substance Use Topics  . Alcohol use: No  . Drug use: Yes    Types: Marijuana    Comment: denies drug use     Allergies   Haldol [haloperidol lactate]  Review of Systems Review of Systems  Unable to perform ROS: Psychiatric disorder  HENT: Positive for congestion.   Psychiatric/Behavioral: Positive for agitation, dysphoric mood and sleep disturbance. Negative for suicidal ideas. The patient is hyperactive.      Physical Exam Updated Vital Signs BP (!) 150/70 (BP Location: Right Arm)   Pulse 98   Temp 98.6 F (37 C) (Oral)   Resp 20   Ht 6' (1.829 m)   Wt 78.9 kg (174 lb)   SpO2 97%   BMI 23.60 kg/m   Physical Exam    Constitutional: He is oriented to person, place, and time. He appears well-developed and well-nourished. No distress.  Afebrile, nontoxic-appearing, sitting comfortably in bed no acute distress.   HENT:  Head: Normocephalic and atraumatic.  Right Ear: External ear normal.  Left Ear: External ear normal.  Mouth/Throat: Oropharynx is clear and moist. No oropharyngeal exudate.  Eyes: Conjunctivae and EOM are normal. Pupils are equal, round, and reactive to light. Right eye exhibits no discharge. Left eye exhibits no discharge.  Neck: Normal range of motion. Neck supple.  Cardiovascular: Normal rate, regular rhythm and normal heart sounds.  No murmur heard. Pulmonary/Chest: Effort normal and breath sounds normal. No stridor. No respiratory distress. He has no wheezes. He has no rales.  Abdominal: Soft. He exhibits no distension. There is no tenderness. There is no guarding.  Musculoskeletal: Normal range of motion. He exhibits no edema or deformity.  Neurological: He is alert and oriented to person, place, and time. No cranial nerve deficit. He exhibits normal muscle tone.  Normal stance and gait.  Skin: Skin is warm and dry. No rash noted. He is not diaphoretic. No erythema. No pallor.  Psychiatric: His speech is normal. His affect is angry, blunt and labile. He is agitated. He expresses impulsivity. He exhibits a depressed mood. He is inattentive.  Nursing note and vitals reviewed.    ED Treatments / Results  Labs (all labs ordered are listed, but only abnormal results are displayed) Labs Reviewed  CBC WITH DIFFERENTIAL/PLATELET - Abnormal; Notable for the following components:      Result Value   WBC 12.2 (*)    Hemoglobin 12.0 (*)    HCT 37.1 (*)    MCV 72.3 (*)    MCH 23.4 (*)    Neutro Abs 8.1 (*)    All other components within normal limits  HEPATIC FUNCTION PANEL - Abnormal; Notable for the following components:   Bilirubin, Direct <0.1 (*)    All other components within  normal limits  ACETAMINOPHEN LEVEL - Abnormal; Notable for the following components:   Acetaminophen (Tylenol), Serum <10 (*)    All other components within normal limits  BASIC METABOLIC PANEL  RAPID URINE DRUG SCREEN, HOSP PERFORMED  URINALYSIS, ROUTINE W REFLEX MICROSCOPIC  SALICYLATE LEVEL  ETHANOL    EKG  EKG Interpretation None       Radiology Dg Chest 2 View  Result Date: 06/28/2017 CLINICAL DATA:  Diarrhea EXAM: CHEST  2 VIEW COMPARISON:  06/25/2017 FINDINGS: Borderline cardiomegaly. No aortic aneurysm. Redemonstration of left basilar atelectasis. No overt pulmonary edema, effusion or pneumothorax. No acute osseous abnormality. IMPRESSION: Borderline cardiomegaly with left basilar atelectasis. Electronically Signed   By: Tollie Eth M.D.   On: 06/28/2017 01:10    Procedures Procedures (including critical care time)  Medications Ordered in ED Medications  atomoxetine (STRATTERA) capsule 18 mg (not administered)  ARIPiprazole (ABILIFY) tablet 15 mg (not administered)  divalproex (DEPAKOTE) DR tablet  500 mg (500 mg Oral Given 06/28/17 2332)  traZODone (DESYREL) tablet 50 mg (50 mg Oral Given 06/28/17 2354)  LORazepam (ATIVAN) injection 1 mg (1 mg Intramuscular Given 06/29/17 0018)     Initial Impression / Assessment and Plan / ED Course  I have reviewed the triage vital signs and the nursing notes.  Pertinent labs & imaging results that were available during my care of the patient were reviewed by me and considered in my medical decision making (see chart for details).       Patient presenting with flights of idea, agitated and depressed.  Denying suicidal Ideations or hallucinations. Unable to obtain clear history from patient. Unable to reach guardian.  Patient has been in ED 34 times in the last 6 months.  I am concerned for patient's safety given his mistrust in guardian, non-compliance with medications and psychotic state. He does not appear to have mental  capacity to make decisions in his best interest and wouldn't be safe for discharge under current condition.  Ordered labs for medical clearance and TTS consult.  TTS recommending observation overnight and evaluation by psychiatry in the morning. Patient was at times cooperative and voluntary, but repeatedly threatning to leave and aggressive towards staff stating he would "wrestle them".  IVC paperwork were completed to ensure patient and others safety. Patient discussed with Dr. Anitra LauthPlunkett who agreed with assessment and signed IVC.  Patient's labs were unremarkable. Home meds ordered.  Pending Psychiatry evaluation in the morning.  Final Clinical Impressions(s) / ED Diagnoses   Final diagnoses:  Psychiatric disorder  History of medication noncompliance    ED Discharge Orders    None       Gregary CromerMitchell, Malissia Rabbani B, PA-C 06/29/17 [id number]    Gwyneth SproutPlunkett, Whitney, MD 06/29/17 1559

## 2017-06-29 NOTE — ED Notes (Addendum)
IVC papers rescinded by Dr Rodena MedinMessick. copy faxed to [employer] De[employer]erk of Court - copy sent to Medical Records, and original placed in folder for [employer].

## 2017-06-29 NOTE — ED Notes (Signed)
Woke pt so Telepsych may be performed. Lights turned on in room - pt states "I want to sleep". Advised pt he may continue sleep after Telepsych is performed.

## 2017-06-29 NOTE — ED Notes (Signed)
Telepsych being performed. 

## 2017-06-29 NOTE — ED Notes (Signed)
IVC PAPERWORK FAXED TO [employer]. 

## 2017-06-30 ENCOUNTER — Encounter (HOSPITAL_COMMUNITY): Payer: Self-pay | Admitting: Emergency Medicine

## 2017-06-30 ENCOUNTER — Emergency Department (HOSPITAL_COMMUNITY)
Admission: EM | Admit: 2017-06-30 | Discharge: 2017-07-01 | Disposition: A | Discharge status: Home / Self Care | Payer: Medicaid Other | Attending: Emergency Medicine | Admitting: Emergency Medicine

## 2017-06-30 ENCOUNTER — Ambulatory Visit (HOSPITAL_COMMUNITY)
Admission: RE | Admit: 2017-06-30 | Discharge: 2017-06-30 | Disposition: A | Discharge status: Home / Self Care | Payer: Medicaid Other | Attending: Psychiatry | Admitting: Psychiatry

## 2017-06-30 ENCOUNTER — Emergency Department (HOSPITAL_COMMUNITY)
Admission: EM | Admit: 2017-06-30 | Discharge: 2017-06-30 | Discharge status: Against Medical Advice | Payer: Medicaid Other

## 2017-06-30 DIAGNOSIS — Z760 Encounter for issue of repeat prescription: Secondary | ICD-10-CM | POA: Diagnosis present

## 2017-06-30 DIAGNOSIS — E119 Type 2 diabetes mellitus without complications: Secondary | ICD-10-CM | POA: Diagnosis not present

## 2017-06-30 DIAGNOSIS — I10 Essential (primary) hypertension: Secondary | ICD-10-CM | POA: Insufficient documentation

## 2017-06-30 DIAGNOSIS — Z79899 Other long term (current) drug therapy: Secondary | ICD-10-CM | POA: Insufficient documentation

## 2017-06-30 DIAGNOSIS — F17210 Nicotine dependence, cigarettes, uncomplicated: Secondary | ICD-10-CM | POA: Insufficient documentation

## 2017-06-30 DIAGNOSIS — R040 Epistaxis: Secondary | ICD-10-CM | POA: Insufficient documentation

## 2017-06-30 NOTE — ED Notes (Signed)
Pt called from the lobby with no response x2 

## 2017-06-30 NOTE — ED Notes (Signed)
Pt called from the lobby with no response 
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
## 2017-06-30 NOTE — ED Notes (Signed)
Pt sleeping on bench in [place].

## 2017-06-30 NOTE — Progress Notes (Signed)
Patient seen by Nurse Practitioner stating he wants us to dispense his medication. Informed patient hospital does not dispense medications to outpatients. Pt denied any other concerns. Offered to call his guardian to pick him up as patient states he is [place] bus back [place]. Patient stated he did not want his guardian called. Bus passes given to patient.    Patient stated he would follow up with ACT team for medication. Informed patient we would call ACT team for him.   This RN called ACT team. ACT team states patient refuses to take his medication and that patients sister has his medication. Patient also refuses to stay in family care home and therefore [facility].    ACT team in process of finding alternative housing. ACT team notified patient is desiring medication.   Rosey BathKelly Yazleen Molock, RN

## 2017-06-30 NOTE — ED Notes (Signed)
Pt called for triage x3 no response 

## 2017-06-30 NOTE — Progress Notes (Signed)
Patient was here after leaving [facility] ED few minutes ago. He stated that he is here to restart his medications because he lost his prescriptions that was given to him yesterday. Patient was [facility] and [facility] with 7 days prescription. Today [facility] and came to behavioral health. Patient requested for a bus pass to return to [place] and agrees to follow up with his ACT Team tomorrow. His ACT team is being notified about patient frequent presentations to the ED. Bus pass provided to patient to return to [place]. No acute concerns voiced.

## 2017-07-01 ENCOUNTER — Encounter (HOSPITAL_COMMUNITY): Payer: Self-pay | Admitting: Emergency Medicine

## 2017-07-01 ENCOUNTER — Telehealth: Payer: Self-pay

## 2017-07-01 ENCOUNTER — Emergency Department (HOSPITAL_COMMUNITY)
Admission: EM | Admit: 2017-07-01 | Discharge: 2017-07-02 | Discharge status: Against Medical Advice | Payer: Medicaid Other | Source: Home / Self Care

## 2017-07-01 DIAGNOSIS — F17210 Nicotine dependence, cigarettes, uncomplicated: Secondary | ICD-10-CM | POA: Diagnosis not present

## 2017-07-01 DIAGNOSIS — E119 Type 2 diabetes mellitus without complications: Secondary | ICD-10-CM | POA: Diagnosis not present

## 2017-07-01 DIAGNOSIS — Z79899 Other long term (current) drug therapy: Secondary | ICD-10-CM | POA: Diagnosis not present

## 2017-07-01 DIAGNOSIS — Z760 Encounter for issue of repeat prescription: Secondary | ICD-10-CM | POA: Diagnosis present

## 2017-07-01 DIAGNOSIS — I10 Essential (primary) hypertension: Secondary | ICD-10-CM | POA: Diagnosis not present

## 2017-07-01 DIAGNOSIS — R040 Epistaxis: Secondary | ICD-10-CM | POA: Diagnosis not present

## 2017-07-01 LAB — CULTURE, BLOOD (ROUTINE X 2)
CULTURE: NO GROWTH
Culture: NO GROWTH
Special Requests: ADEQUATE
Special Requests: ADEQUATE

## 2017-07-01 LAB — CBG MONITORING, ED: GLUCOSE-CAPILLARY: 86 mg/dL (ref 65–99)

## 2017-07-01 MED ORDER — ARIPIPRAZOLE 10 MG PO TABS
15.0000 mg | ORAL_TABLET | Freq: Once | ORAL | Status: AC
Start: 2017-07-01 — End: 2017-07-01
  Administered 2017-07-01: 15 mg via ORAL
  Filled 2017-07-01: qty 2

## 2017-07-01 MED ORDER — DIVALPROEX SODIUM 250 MG PO DR TAB
500.0000 mg | DELAYED_RELEASE_TABLET | Freq: Once | ORAL | Status: AC
  Administered 2017-07-01: 500 mg via ORAL
  Filled 2017-07-01: qty 2

## 2017-07-01 MED ORDER — DIVALPROEX SODIUM 500 MG PO DR TAB: 500.0000 mg | DELAYED_RELEASE_TABLET | Freq: Two times a day (BID) | ORAL | 0 refills | Status: DC

## 2017-07-01 MED ORDER — ARIPIPRAZOLE 15 MG PO TABS: 15.0000 mg | ORAL_TABLET | Freq: Every day | ORAL | 0 refills | Status: DC

## 2017-07-01 MED FILL — ARIPiprazole 5 MG TABS: 5 | 14 days supply | Qty: 14 | Fill #0

## 2017-07-01 MED FILL — DIVALPROEX SOD DR 500 MG TA: 500 | 14 days supply | Qty: 28 | Fill #0

## 2017-07-01 NOTE — ED Notes (Signed)
Diet ordered for pt. Request pancakes
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
## 2017-07-01 NOTE — ED Notes (Signed)
Pt given pancakes and apple juice.

## 2017-07-01 NOTE — Discharge Instructions (Signed)

## 2017-07-01 NOTE — ED Triage Notes (Signed)
Per PTAR, patient c/o congestion x1 month. Patient reports he is homeless. Ambulatory.
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
## 2017-07-01 NOTE — ED Notes (Signed)
Pt c/o poor circulation in his feet due to diabetes.

## 2017-07-01 NOTE — ED Notes (Signed)
No answer for vital signs recheck 

## 2017-07-02 ENCOUNTER — Telehealth: Payer: Self-pay

## 2017-07-02 NOTE — Congregational Nurse Program (Signed)
Congregational Nurse Program Note  Date of Encounter: 07/01/2017  Past Medical History: Past Medical History:  Diagnosis Date  . Anxiety   . Depression   . Diabetes mellitus without complication ([place])   . Homelessness   . Hypertension   . Schizophrenia [facility]     Encounter Details: CNP Questionnaire - 07/01/17 1002      Questionnaire   Patient Status  Not Applicable    Race  Black or African American    Insurance  Medicaid    Uninsured  Not Applicable    Food  Yes, have food insecurities;Within past 12 months, worried food would run out with no money to buy more    Housing/Utilities  No permanent housing    Transportation  Yes, need transportation assistance    Interpersonal Safety  No, do not feel physically and emotionally safe where you currently live    Medication  Yes, have medication insecurities;Provided medication assistance    Medical Provider  Yes    Referrals  Area Agency;Other    ED Visit Averted  Not Applicable    Life-Saving Intervention Made  Not Applicable      Staff at the [facility] requested I see client.  Client was seen in the ED last night and has prescriptions (Abilify and Depakote) to be filled.  Client states he left a group home then was at [facility].  States had to leave [employer].  Was unclear as to why.  Client presents very anxious with hyperactivity.  ST memory very poor.  Has a history of walking off from placements.  Does not remember why.  Is being followed by an ACT from [place].  He does not remember the name.  Ernest Pine with Adult Protective Services here [phone]).  Ms. Joya Gaskins and myself met with client.  Ms. Joya Gaskins had the name of the client's guardian and the ACT team in [place].  With Ms. Joya Gaskins and client present [employer] [phone]) were notified.  Client is well known to the ACT team.  The crisis team member reported client has been non-compliant and uncooperative with care.  This worker states they have had  difficulty connecting with the guardian.  The crisis team member was aware that the client has been in multiple group homes where he has never become established as he keeps walking away.  Once he leaves a group home, they are unable to locate him as client does not have a phone.  TC to guardian Solmon Ice [phone]).  LM for him to contact me ASAP as it was urgent regarding this client.  Medications obtained.  Client was instructed to not leave the building as I would be getting his medications.  When I went to the day room locate client, he had left.
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
## 2017-07-02 NOTE — Congregational Nurse Program (Signed)
Congregational Nurse Program Note  Date of Encounter: 07/02/2017  Past Medical History: Past Medical History:  Diagnosis Date  . Anxiety   . Depression   . Diabetes mellitus without complication (HCC)   . Homelessness   . Hypertension   . Schizophrenia [facility](HCC)     Encounter Details: CNP Questionnaire - 07/02/17 1442      Questionnaire   Patient Status  Not Applicable    Race  Black or African American    Location Patient Served At  Not Applicable    Insurance  Medicaid    Uninsured  Not Applicable    Food  Yes, have food insecurities;Within past 12 months, worried food would run out with no money to buy more    Housing/Utilities  No permanent housing    Transportation  Yes, need transportation assistance    Interpersonal Safety  No, do not feel physically and emotionally safe where you currently live    Medication  Yes, have medication insecurities;Provided medication assistance    Medical Provider  Yes    Referrals  Area Agency;Other    ED Visit Averted  Not Applicable    Life-Saving Intervention Made  Not Applicable       07/02/17 0900 [facility].  He states he wants to take his shot.  Contacted his ACTT Team to inform.  The clinician stated it has been a while since the client has received his medication by injection and that the injections were discontinued because the client refused to take them.  Client willing to take his Abilify 5 mg and his Depakote ER  500 mg PO.  Medication administered.  Given the client's functional level, I did not feel it was safe to leave the bottles with him.  Will direct client to [facility] for next dosing.  I will leave the Depakote ER 500 mg pm dose with him, in hopes he will remember to take it.  Asked client where he stayed last night.  [facility].    Client's guardian, [name] returned my call from yesterday.  Phone call was placed on speaker for client to  participate.  Mr. Lorayne MarekGash was informed that he was on speaker phone and client was present.  Mr. Lorayne MarekGash was informed that client is homeless and on the streets.  I also informed Mr. Lorayne MarekGash, that I did not believe the client was safe.  I asked Mr. Lorayne MarekGash what the plan was for providing emergency care.  Mr. Lorayne MarekGash stated he was trying to find a group home, but the client has been in multiple group homes, but he always leaves.  Mr. Lorayne MarekGash stated he would not come and get the client as "I have no place to take him".    0930  TC to Arnetha Courserarol Wright, social worker with APS to inform her about the above. Waiting for a call back  1300  Sharman CrateLaurie Arena NP with [facility] [phone]) here to assess client.  Fl-2 being completed.  Client was given Abilify injection 400 mg IM.  Instructions from Ms. Racheal Patchesrena was to continue the Abilify PO for two weeks and the Depakote ER.  1400.  Arnetha Courserarol Wright with APS returned my call.  Informed Ms. Delford FieldWright of above.  She stated she will continue to reach out to [facility] and his Guardian to determine what the current plan is and to assess if another  guardian is warranted.    Current Plan 1.  APS will continue investigating the guardian and company to determine what actions need to be taken.  APS will work with the guardian to assist with placement options. 2.  Client will remain at the [facility] tonight as the "white flag" is out. 3.  Congregational Nurse, Lattie Haw will administer the Abilify and Depakote ER, provided client is available.  Lattie Haw RN, BSN, MSN [phone]

## 2017-07-02 NOTE — ED Notes (Signed)
[facility] and [facility] [facility]

## 2017-07-02 NOTE — Telephone Encounter (Signed)
APS case [facility] been [facility]. APS worker is Arnetha CourserCarol Wright at [phone].

## 2017-07-03 ENCOUNTER — Encounter (HOSPITAL_COMMUNITY): Payer: Self-pay | Admitting: *Deleted

## 2017-07-03 DIAGNOSIS — E119 Type 2 diabetes mellitus without complications: Secondary | ICD-10-CM | POA: Diagnosis not present

## 2017-07-03 DIAGNOSIS — Z590 Homelessness: Secondary | ICD-10-CM | POA: Diagnosis not present

## 2017-07-03 DIAGNOSIS — F17210 Nicotine dependence, cigarettes, uncomplicated: Secondary | ICD-10-CM | POA: Diagnosis not present

## 2017-07-03 DIAGNOSIS — I10 Essential (primary) hypertension: Secondary | ICD-10-CM | POA: Diagnosis not present

## 2017-07-03 DIAGNOSIS — Y939 Activity, unspecified: Secondary | ICD-10-CM | POA: Insufficient documentation

## 2017-07-03 DIAGNOSIS — R0981 Nasal congestion: Secondary | ICD-10-CM | POA: Insufficient documentation

## 2017-07-03 DIAGNOSIS — Y999 Unspecified external cause status: Secondary | ICD-10-CM | POA: Insufficient documentation

## 2017-07-03 DIAGNOSIS — T6591XA Toxic effect of unspecified substance, accidental (unintentional), initial encounter: Secondary | ICD-10-CM | POA: Insufficient documentation

## 2017-07-03 DIAGNOSIS — Y929 Unspecified place or not applicable: Secondary | ICD-10-CM | POA: Diagnosis not present

## 2017-07-03 DIAGNOSIS — F209 Schizophrenia, unspecified: Secondary | ICD-10-CM | POA: Diagnosis not present

## 2017-07-03 DIAGNOSIS — R05 Cough: Secondary | ICD-10-CM | POA: Insufficient documentation

## 2017-07-03 NOTE — ED Notes (Signed)
I have spoke with poison control, they recommend a 6 hour obs from time of ingestion (1700 today), ekg and tylenol level. If levels, ekg and vitals stable, okay for d/c.

## 2017-07-03 NOTE — ED Triage Notes (Signed)
[facility]. Pt reported to [facility] he  took a bottle of Robitussin because he wanted his cold that he has had for 2 months  to go away faster around 5pm. 178/100, hr 98, r 20, 99% RA, cbg 113.

## 2017-07-03 NOTE — ED Triage Notes (Signed)
Pt states that he took 30 robitussin capsules at 1700 today for his cold that he has had for two months. States he has tried everything and he just cant get his cold to go away. The pt denies that this was any type of SI attempt. Denies HI. Attempted to contact legal guardian, awaiting call back.

## 2017-07-04 ENCOUNTER — Emergency Department (HOSPITAL_COMMUNITY)
Admission: EM | Admit: 2017-07-04 | Discharge: 2017-07-04 | Disposition: A | Discharge status: Home / Self Care | Payer: Medicaid Other | Attending: Emergency Medicine | Admitting: Emergency Medicine

## 2017-07-04 ENCOUNTER — Emergency Department (HOSPITAL_COMMUNITY)
Admission: EM | Admit: 2017-07-04 | Discharge: 2017-07-05 | Discharge status: Against Medical Advice | Payer: Medicaid Other | Source: Home / Self Care

## 2017-07-04 ENCOUNTER — Emergency Department (HOSPITAL_COMMUNITY)
Admission: EM | Admit: 2017-07-04 | Discharge: 2017-07-04 | Discharge status: Against Medical Advice | Payer: Medicaid Other | Source: Home / Self Care

## 2017-07-04 ENCOUNTER — Emergency Department (HOSPITAL_COMMUNITY)
Admission: EM | Admit: 2017-07-04 | Discharge: 2017-07-04 | Disposition: A | Discharge status: Home / Self Care | Payer: Medicaid Other | Source: Home / Self Care | Attending: Emergency Medicine | Admitting: Emergency Medicine

## 2017-07-04 ENCOUNTER — Encounter (HOSPITAL_COMMUNITY): Payer: Self-pay | Admitting: Emergency Medicine

## 2017-07-04 DIAGNOSIS — Z760 Encounter for issue of repeat prescription: Secondary | ICD-10-CM

## 2017-07-04 DIAGNOSIS — T6591XA Toxic effect of unspecified substance, accidental (unintentional), initial encounter: Secondary | ICD-10-CM

## 2017-07-04 LAB — ACETAMINOPHEN LEVEL: Acetaminophen (Tylenol), Serum: <10 ug/mL — ABNORMAL LOW (ref 10–30)

## 2017-07-04 NOTE — ED Notes (Signed)
When checking for patient for the last time. Pt had gone somewhere to eat which is why he didn't previously answer. He understands that he missed his first 2 calls and will have to wait for a room since we had to move on.

## 2017-07-04 NOTE — ED Notes (Signed)
Pt called for room x1. No answer.  

## 2017-07-04 NOTE — ED Notes (Signed)
Pt was called 4 times for triage and no response. Walked around lobby and looked at stickers and did not see a Microbiologistmatch.

## 2017-07-04 NOTE — Discharge Instructions (Signed)
Please follow-up with Lattie Hawharlotte Evans, RN, tomorrow to receive your home medications.  If you develop new or worsening symptoms, including thoughts of wanting to kill yourself or others, seeing or hearing voices that may not actually be there, or other new concerning symptoms, please return to the emergency department for reevaluation.

## 2017-07-04 NOTE — ED Triage Notes (Signed)
Pt checked back in right after being discharged Pt is not in the lobby or outside at this time

## 2017-07-04 NOTE — ED Notes (Signed)
Pt called for room x2 

## 2017-07-04 NOTE — ED Triage Notes (Signed)
Patient states he threw away his [facility]. States "something told me to throw it away. I promise to never throw them away again, but I need the medicine." pt states he lives at [facility]. Denies SI/HI or any hallucinations.

## 2017-07-04 NOTE — ED Notes (Signed)
No answer when called from lobby to triage. 

## 2017-07-04 NOTE — Discharge Instructions (Signed)
Do not take more than what the bottle tells you that you can take of your medication.

## 2017-07-04 NOTE — Progress Notes (Signed)
CSW met with pt and pt desired meds (Straterra and Trazadone).  EDP unable to provide and pt updated.  CSW updated pt's APS worker Ernest Pine by phone at ph: [phone]. And pt's legal guardians at ph: [facility] [phone] (emergency #) [phone] but crisis worker hung up on the CSW.  Pt stated to [facility] was expecting him and he had a bed but he needed D/C paperwork stating pt was at ED.  PA updated.  RN providing D/C paperwork.  Pt again requested meds and was told this was not possible by PA and CSW.  PA stated pt was not receiving thee meds at pt claimed, per chart.  Pt updated.  Pt being D/C'd with a bus pass.  Please reconsult if future social work needs arise.  CSW signing off, as social work intervention is no longer needed.  Alphonse Guild. Jerzey Komperda, LCSW, LCAS, CSI Clinical Social Worker Ph: [phone]

## 2017-07-04 NOTE — ED Notes (Signed)
Second attempt to contact pts guardian, message left.

## 2017-07-04 NOTE — Congregational Nurse Program (Signed)
Congregational Nurse Program Note  Date of Encounter: 07/03/2017 Past Medical History: Past Medical History:  Diagnosis Date  . Anxiety   . Depression   . Diabetes mellitus without complication (HCC)   . Homelessness   . Hypertension   . Schizophrenia [facility](HCC)     Encounter Details: CNP Questionnaire - 07/03/17 1014      Questionnaire   Patient Status  Not Applicable    Race  Black or African American    Location Patient Served At  Not Applicable    Insurance  Medicaid    Uninsured  Not Applicable    Food  Yes, have food insecurities;Within past 12 months, worried food would run out with no money to buy more    Housing/Utilities  No permanent housing    Transportation  Yes, need transportation assistance    Interpersonal Safety  No, do not feel physically and emotionally safe where you currently live    Medication  Yes, have medication insecurities;Provided medication assistance    Medical Provider  Yes    Referrals  Area Agency;Other    ED Visit Averted  Not Applicable    Life-Saving Intervention Made  Not Applicable      [facility]  Staff report that at about 6 am client became agitated and "bolted out the door" where he boarded a bus.  Police were notified and [facility]  I saw client at 9:30 am.  He was calm.  His dose of Abilify and Depakote ER were administered.  Since I will not be at [facility], I gave client his pm dose of Depakote ER and Thursday medication doses in a medication caddy.  Client was instructed to return to see me on Friday and I will administer his medication.  TC to Rogelia BogaMichael Pierce at [facility].  He has agreed to allow client to return to the shelter.  [facility] that he was accepted back into the shelter and to assist him in getting there at the end of the day.

## 2017-07-04 NOTE — ED Notes (Signed)
Attempting to dc pt. Pt not in his room as he was asked to wait there multiple times by multiple staff. Pt found outside main entrance of ED holding all his belongings. Pt asked to come back to his room for DC papers and DC vs.

## 2017-07-04 NOTE — ED Notes (Signed)
Pt was found to be wiping his arms with the purple-top wipes and told that they aren't for use on the skin.

## 2017-07-04 NOTE — ED Notes (Signed)
Patient discharged to waiting room. AVS reviewed with pt and all questions answered. Pt a/o and ambulatory. Pt verbalizes understanding that he may stay in waiting room until he has transportation in the am.

## 2017-07-04 NOTE — ED Notes (Signed)
Pt has come out of the room a few different times.  Once to talk about his medications and another to say something about [facility] to [facility].

## 2017-07-04 NOTE — ED Notes (Signed)
Bed: WTR8 Expected date:  Expected time:  Means of arrival:  Comments: 

## 2017-07-04 NOTE — ED Notes (Signed)
Victorino DikeJennifer from [facility] returned our call to report that the patient is no longer under their care. He is now a [facility] with [phone] (emergency #) [phone]. This RN called the emergency line and spoke with Deliah BostonJeanette Kuehn, regarding the patient. She states they do not provide transportation. She is aware and in agreement with patient being discharged to the waiting room and given a bus pass or cab  back to the [facility] in the morning.

## 2017-07-04 NOTE — ED Notes (Signed)
Poison control called for update.

## 2017-07-04 NOTE — ED Provider Notes (Signed)
[facility]PT Provider Note   CSN: [id number] Arrival date & time: 07/03/17  2313     History   Chief Complaint Chief Complaint  Patient presents with  . Ingestion    HPI John Copeland is a 27 y.o. male.  He has a history of homelessness and schizophrenia and has a legal guardian.  Patient states that he has had 2 months of a cough and nasal discharge and wanted to make it go away so he took 30 Robitussin at 5 PM.  By the time I saw the patient he was medically cleared according to [facility] poison control who had been contacted at initial triage.  Patient denies having fevers or chills.  HPI  Past Medical History:  Diagnosis Date  . Anxiety   . Depression   . Diabetes mellitus without complication (HCC)   . Homelessness   . Hypertension   . Schizophrenia [facility])     Patient Active Problem List   Diagnosis Date Noted  . Left lower lobe pneumonia (HCC) 06/26/2017  . Accidental drug overdose   . Schizophrenia (HCC) 03/29/2017  . 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 Diphenhydramine overdose of undetermined intent 01/10/2017  . Tobacco use disorder 10/07/2016  . Diabetes (HCC) 10/07/2016  . HTN (hypertension) 10/07/2016  . Dyslipidemia 10/07/2016  . Schizoaffective disorder, bipolar type (HCC) 11/30/2015    History reviewed. No pertinent surgical history.     Home Medications    Prior to Admission medications   Medication Sig Start Date End Date Taking? Authorizing Provider  ARIPiprazole (ABILIFY) 15 MG tablet Take 1 tablet (15 mg total) by mouth daily for 14 days. For mood control 07/01/17 07/15/17 Yes Long, Arlyss Repress, MD  divalproex (DEPAKOTE) 500 MG DR tablet Take 1 tablet (500 mg total) by mouth every 12 (twelve) hours for 14 days. For mood stabilization 07/01/17 07/15/17 Yes Long, Arlyss Repress, MD  amoxicillin (AMOXIL) 500 MG capsule Take 1 capsule (500 mg total) by mouth 3 (three) times daily. Patient not taking: Reported on 07/04/2017  06/28/17   Palumbo, April, MD  atomoxetine (STRATTERA) 18 MG capsule Take 1 capsule (18 mg total) by mouth daily. For ADHD Patient not taking: Reported on 06/26/2017 06/25/17   Armandina Stammer I, NP  benzonatate (TESSALON) 100 MG capsule Take 1 capsule (100 mg total) by mouth 3 (three) times daily as needed for cough. Patient not taking: Reported on 06/26/2017 06/24/17   Petrucelli, Lelon Mast R, PA-C  fluticasone (FLONASE) 50 MCG/ACT nasal spray Place 1 spray into both nostrils daily. Patient not taking: Reported on 06/26/2017 06/24/17   Petrucelli, Pleas Koch, PA-C  ibuprofen (ADVIL,MOTRIN) 800 MG tablet Take 1 tablet (800 mg total) by mouth 3 (three) times daily. Patient not taking: Reported on 06/26/2017 06/24/17   Petrucelli, Pleas Koch, PA-C  traZODone (DESYREL) 50 MG tablet Take 1 tablet (50 mg total) by mouth at bedtime as needed for sleep. Patient not taking: Reported on 06/26/2017 06/24/17   Sanjuana Kava, NP    Family History Family History  Problem Relation Age of Onset  . Diabetes Other     Social History Social History   Tobacco Use  . Smoking status: Current Every Day Smoker    Packs/day: 0.00    Types: Cigarettes  . Smokeless tobacco: Never Used  Substance Use Topics  . Alcohol use: No  . Drug use: Yes    Types: Marijuana    Comment: denies drug use     Allergies   Haldol [  haloperidol lactate]   Review of Systems Review of Systems Ten systems reviewed and are negative for acute change, except as noted in the HPI.    Physical Exam Updated Vital Signs BP (!) 162/98 (BP Location: Right Arm)   Pulse 100   Temp 99.1 F (37.3 C) (Oral)   Resp 16   SpO2 100%   Physical Exam  Constitutional: He appears well-developed and well-nourished. No distress.  HENT:  Head: Normocephalic and atraumatic.  Eyes: Conjunctivae are normal. No scleral icterus.  Neck: Normal range of motion. Neck supple.  Cardiovascular: Normal rate, regular rhythm and normal heart sounds.    Pulmonary/Chest: Effort normal and breath sounds normal. No respiratory distress.  Abdominal: Soft. There is no tenderness.  Musculoskeletal: He exhibits no edema.  Neurological: He is alert.  Skin: Skin is warm and dry. He is not diaphoretic.  Psychiatric:  Bizarre behavior  Nursing note and vitals reviewed.    ED Treatments / Results  Labs (all labs ordered are listed, but only abnormal results are displayed) Labs Reviewed  ACETAMINOPHEN LEVEL - Abnormal; Notable for the following components:      Result Value   Acetaminophen (Tylenol), Serum <10 (*)    All other components within normal limits    EKG  EKG Interpretation  Date/Time:  Wednesday July 03 2017 23:53:36 EST Ventricular Rate:  108 PR Interval:    QRS Duration: 90 QT Interval:  339 QTC Calculation: 455 R Axis:   -104 Text Interpretation:  Sinus tachycardia Left anterior fascicular block ST elev, probable normal early repol pattern Rate is faster Confirmed by Paula LibraMolpus, John ([id number]) on 07/04/2017 12:55:46 AM       Radiology No results found.  Procedures Procedures (including critical care time)  Medications Ordered in ED Medications - No data to display   Initial Impression / Assessment and Plan / ED Course  I have reviewed the triage vital signs and the nursing notes.  Pertinent labs & imaging results that were available during my care of the patient were reviewed by me and considered in my medical decision making (see chart for details).     Patient medically clear, EKG reviewed without significant abnormality.  He seems to be at his baseline mental status.  His legal guardians have been contacted.  Patient will be discharged to the lobby to wait till morning for bus pass.  Final Clinical Impressions(s) / ED Diagnoses   Final diagnoses:  Accidental ingestion of substance, initial encounter    ED Discharge Orders    None       Arthor CaptainHarris, Josh Nicolosi, PA-C 07/04/17 0341    Paula LibraMolpus, John,  MD 07/04/17 [phone]

## 2017-07-04 NOTE — ED Provider Notes (Signed)
[facility]-EMERGENCY DEPT Provider Note   CSN: [id number] Arrival date & time: 07/04/17  1231     History   Chief Complaint Chief Complaint  Patient presents with  . Medication Refill    HPI John Copeland is a 27 y.o. male with a history of anxiety, depression, schizophrenia, HTN, and DM.  He has a history of homelessness and has a legal guardian.  He is well-known to this ED and has been seen 36 times over the past 6 months.  He states that he was impulsive and threw away his ADHD medication prescriptions that were given to him by a psychiatrist at [facility]. He states "I promise I'll never throw them away again."  He is also requesting more cough medicine.  He states "Dr. Alona Bene gave me prescriptions, and I would like it if you would just refill those prescriptions for me."  He was seen earlier today after taking 30 Robitussin tablets at 5 PM yesterday and he was discharged after following poison control recommendations.  He denies SI, HI, auditory or visual hallucinations, chest pain, shortness of breath, fever, chills, abdominal pain, nausea, vomiting, diarrhea.  Last [facility] d/c on 01/31.   The history is provided by the patient. No language interpreter was used.    Past Medical History:  Diagnosis Date  . Anxiety   . Depression   . Diabetes mellitus without complication (HCC)   . Homelessness   . Hypertension   . Schizophrenia [facility])     Patient Active Problem List   Diagnosis Date Noted  . Left lower lobe pneumonia (HCC) 06/26/2017  . Accidental drug overdose   . Schizophrenia (HCC) 03/29/2017  . 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 Diphenhydramine overdose of undetermined intent 01/10/2017  . Tobacco use disorder 10/07/2016  . Diabetes (HCC) 10/07/2016  . HTN (hypertension) 10/07/2016  . Dyslipidemia 10/07/2016  . Schizoaffective disorder, bipolar type (HCC) 11/30/2015    History reviewed. No pertinent surgical history.     Home Medications     Prior to Admission medications   Medication Sig Start Date End Date Taking? Authorizing Provider  amoxicillin (AMOXIL) 500 MG capsule Take 1 capsule (500 mg total) by mouth 3 (three) times daily. Patient not taking: Reported on 07/04/2017 06/28/17   Palumbo, April, MD  ARIPiprazole (ABILIFY) 15 MG tablet Take 1 tablet (15 mg total) by mouth daily for 14 days. For mood control 07/01/17 07/15/17  Long, Arlyss Repress, MD  atomoxetine (STRATTERA) 18 MG capsule Take 1 capsule (18 mg total) by mouth daily. For ADHD Patient not taking: Reported on 06/26/2017 06/25/17   Armandina Stammer I, NP  benzonatate (TESSALON) 100 MG capsule Take 1 capsule (100 mg total) by mouth 3 (three) times daily as needed for cough. Patient not taking: Reported on 06/26/2017 06/24/17   Petrucelli, Lelon Mast R, PA-C  divalproex (DEPAKOTE) 500 MG DR tablet Take 1 tablet (500 mg total) by mouth every 12 (twelve) hours for 14 days. For mood stabilization 07/01/17 07/15/17  Long, Arlyss Repress, MD  fluticasone (FLONASE) 50 MCG/ACT nasal spray Place 1 spray into both nostrils daily. Patient not taking: Reported on 06/26/2017 06/24/17   Petrucelli, Pleas Koch, PA-C  ibuprofen (ADVIL,MOTRIN) 800 MG tablet Take 1 tablet (800 mg total) by mouth 3 (three) times daily. Patient not taking: Reported on 06/26/2017 06/24/17   Petrucelli, Pleas Koch, PA-C  traZODone (DESYREL) 50 MG tablet Take 1 tablet (50 mg total) by mouth at bedtime as needed for sleep. Patient not taking: Reported on  06/26/2017 06/24/17   Sanjuana Kava, NP    Family History Family History  Problem Relation Age of Onset  . Diabetes Other     Social History Social History   Tobacco Use  . Smoking status: Current Every Day Smoker    Packs/day: 0.00    Types: Cigarettes  . Smokeless tobacco: Never Used  Substance Use Topics  . Alcohol use: No  . Drug use: Yes    Types: Marijuana    Comment: denies drug use     Allergies   Haldol [haloperidol lactate]   Review of Systems Review  of Systems  Constitutional: Negative for activity change.  Respiratory: Positive for cough. Negative for shortness of breath.   Cardiovascular: Negative for chest pain.  Gastrointestinal: Negative for abdominal pain.  Musculoskeletal: Negative for back pain.  Skin: Negative for rash.  Psychiatric/Behavioral: Negative for confusion, hallucinations, self-injury and suicidal ideas.     Physical Exam Updated Vital Signs BP (!) 156/89 (BP Location: Left Arm)   Pulse (!) 113   Temp 98.8 F (37.1 C) (Oral)   Resp 16   SpO2 99%   Physical Exam  Constitutional: He appears well-developed.  HENT:  Head: Normocephalic.  Eyes: Conjunctivae are normal.  Neck: Neck supple.  Cardiovascular: Normal rate, regular rhythm, normal heart sounds and intact distal pulses. Exam reveals no gallop and no friction rub.  No murmur heard. Pulmonary/Chest: Effort normal and breath sounds normal. No stridor. No respiratory distress. He has no wheezes. He has no rales. He exhibits no tenderness.  Abdominal: Soft. He exhibits no distension.  Neurological: He is alert.  Bizarre behavior  Skin: Skin is warm and dry.  Psychiatric: His speech is normal and behavior is normal. His affect is blunt. He is not actively hallucinating. He expresses impulsivity. He expresses no homicidal and no suicidal ideation. He expresses no suicidal plans and no homicidal plans.  Nursing note and vitals reviewed.    ED Treatments / Results  Labs (all labs ordered are listed, but only abnormal results are displayed) Labs Reviewed - No data to display  EKG  EKG Interpretation None       Radiology No results found.  Procedures Procedures (including critical care time)  Medications Ordered in ED Medications - No data to display   Initial Impression / Assessment and Plan / ED Course  I have reviewed the triage vital signs and the nursing notes.  Pertinent labs & imaging results that were available during my care of  the patient were reviewed by me and considered in my medical decision making (see chart for details).     27 year old male with history of schizophrenia, anxiety, depression, DM, and hypertension presenting for medication refill.  He currently suffers from homelessness.  The patient has a legal guardian and is established with the act team.  He denies SI, HI, and auditory or visual hallucinations.  No focal findings on physical exam.  He is not responding to internal stimuli on my exam.  He is requesting a refill of his ADHD medication after he states that he threw his prescription from [facility].  A review of his medical record reveals that he is getting his Depakote and Abilify from the congregational nurse program.  Consult to social work who contacted the patient's legal guardian, APS, and IRC.  The patient is safe to be discharged and can return back to the [facility].  Discussed with the patient that he needs to follow up with the act team at  [facility] for medication refills for his ADHD medication.   Plan for discharge.  He is hemodynamically stable and in no acute distress.  Strict return precautions given.  Patient is safe for discharge at this time.  Final Clinical Impressions(s) / ED Diagnoses   Final diagnoses:  Medication refill    ED Discharge Orders    None       Barkley BoardsMcDonald, Mia A, PA-C 07/04/17 2353    Nira Connardama, Pedro Eduardo, MD 07/04/17 2354

## 2017-07-05 ENCOUNTER — Emergency Department (HOSPITAL_COMMUNITY)
Admission: EM | Admit: 2017-07-05 | Discharge: 2017-07-06 | Disposition: A | Discharge status: Home / Self Care | Payer: Medicaid Other | Attending: Emergency Medicine | Admitting: Emergency Medicine

## 2017-07-05 ENCOUNTER — Other Ambulatory Visit: Payer: Self-pay

## 2017-07-05 ENCOUNTER — Encounter (HOSPITAL_COMMUNITY): Payer: Self-pay | Admitting: Emergency Medicine

## 2017-07-05 DIAGNOSIS — F17210 Nicotine dependence, cigarettes, uncomplicated: Secondary | ICD-10-CM | POA: Diagnosis not present

## 2017-07-05 DIAGNOSIS — T510X2A Toxic effect of ethanol, intentional self-harm, initial encounter: Secondary | ICD-10-CM | POA: Diagnosis not present

## 2017-07-05 DIAGNOSIS — R4586 Emotional lability: Secondary | ICD-10-CM

## 2017-07-05 DIAGNOSIS — Z794 Long term (current) use of insulin: Secondary | ICD-10-CM | POA: Insufficient documentation

## 2017-07-05 DIAGNOSIS — F209 Schizophrenia, unspecified: Secondary | ICD-10-CM | POA: Diagnosis present

## 2017-07-05 DIAGNOSIS — R45851 Suicidal ideations: Secondary | ICD-10-CM | POA: Diagnosis not present

## 2017-07-05 DIAGNOSIS — I10 Essential (primary) hypertension: Secondary | ICD-10-CM | POA: Insufficient documentation

## 2017-07-05 DIAGNOSIS — Z8659 Personal history of other mental and behavioral disorders: Secondary | ICD-10-CM

## 2017-07-05 DIAGNOSIS — Z590 Homelessness: Secondary | ICD-10-CM | POA: Diagnosis not present

## 2017-07-05 DIAGNOSIS — E119 Type 2 diabetes mellitus without complications: Secondary | ICD-10-CM | POA: Insufficient documentation

## 2017-07-05 DIAGNOSIS — Z79899 Other long term (current) drug therapy: Secondary | ICD-10-CM | POA: Diagnosis not present

## 2017-07-05 LAB — RAPID URINE DRUG SCREEN, HOSP PERFORMED
Amphetamines: NOT DETECTED
BARBITURATES: NOT DETECTED
Benzodiazepines: NOT DETECTED
Cocaine: NOT DETECTED
Opiates: NOT DETECTED
TETRAHYDROCANNABINOL: NOT DETECTED

## 2017-07-05 LAB — COMPREHENSIVE METABOLIC PANEL
ALBUMIN: 3.6 g/dL (ref 3.5–5.0)
ALT: 28 U/L (ref 17–63)
ANION GAP: 10 (ref 5–15)
AST: 26 U/L (ref 15–41)
Alkaline Phosphatase: 53 U/L (ref 38–126)
BILIRUBIN TOTAL: 0.4 mg/dL (ref 0.3–1.2)
BUN: 13 mg/dL (ref 6–20)
CALCIUM: 9.6 mg/dL (ref 8.9–10.3)
CO2: 23 mmol/L (ref 22–32)
CREATININE: 0.94 mg/dL (ref 0.61–1.24)
Chloride: 105 mmol/L (ref 101–111)
GFR calc non Af Amer: >60 mL/min (ref >60)
GLUCOSE: 110 mg/dL — AB (ref 65–99)
Potassium: 4.1 mmol/L (ref 3.5–5.1)
Sodium: 138 mmol/L (ref 135–145)
TOTAL PROTEIN: 6.7 g/dL (ref 6.5–8.1)

## 2017-07-05 LAB — ETHANOL: Alcohol, Ethyl (B): <10 mg/dL (ref <10)

## 2017-07-05 LAB — CBC
HEMATOCRIT: 36.2 % — AB (ref 39.0–52.0)
Hemoglobin: 11.9 g/dL — ABNORMAL LOW (ref 13.0–17.0)
MCH: 23.9 pg — AB (ref 26.0–34.0)
MCHC: 32.9 g/dL (ref 30.0–36.0)
MCV: 72.7 fL — ABNORMAL LOW (ref 78.0–100.0)
Platelets: 412 10*3/uL — ABNORMAL HIGH (ref 150–400)
RBC: 4.98 MIL/uL (ref 4.22–5.81)
RDW: 14 % (ref 11.5–15.5)
WBC: 7.6 10*3/uL (ref 4.0–10.5)

## 2017-07-05 LAB — ACETAMINOPHEN LEVEL: Acetaminophen (Tylenol), Serum: <10 ug/mL — ABNORMAL LOW (ref 10–30)

## 2017-07-05 LAB — SALICYLATE LEVEL

## 2017-07-05 MED ORDER — ARIPIPRAZOLE 5 MG PO TABS
15.0000 mg | ORAL_TABLET | Freq: Every day | ORAL | Status: DC
  Administered 2017-07-05: 09:00:00 15 mg via ORAL
  Filled 2017-07-05: qty 1

## 2017-07-05 MED ORDER — DIVALPROEX SODIUM 250 MG PO DR TAB
500.0000 mg | DELAYED_RELEASE_TABLET | Freq: Two times a day (BID) | ORAL | Status: DC
  Administered 2017-07-05 (×2): 500 mg via ORAL
  Filled 2017-07-05 (×2): qty 2

## 2017-07-05 MED ORDER — FLUTICASONE PROPIONATE 50 MCG/ACT NA SUSP
1.0000 | Freq: Every day | NASAL | Status: DC
  Administered 2017-07-05: 1 via NASAL
  Filled 2017-07-05: qty 16

## 2017-07-05 MED ORDER — TRAZODONE HCL 50 MG PO TABS
50.0000 mg | ORAL_TABLET | Freq: Every evening | ORAL | Status: DC | PRN
  Filled 2017-07-05 (×2): qty 1

## 2017-07-05 NOTE — BHH Counselor (Signed)
Disposition:   Per Shuvon Rankin, NP, patient recommended for inpatient treatment 

## 2017-07-05 NOTE — BH Assessment (Signed)
Tele Assessment Note   Patient Name: John MuffJoshua Omar Copeland MRN: [id number] Referring Physician: Dierdre ForthMuthersbaugh, Hannah, PA-C [facility] Location of Provider: Behavioral Health TTS Department  Bethann HumbleJoshua Omar [name] is an 27 y.o. [facility] self reporting he ingested hand sanitizer from a 3oz bottle. Patient report he initially ingested the hand sanitizer stating it was a suicide attempt. Report feeling depressed, sad with crying spills. Report depressed feelings triggered by strained relationship with family members, "My family does not want to have anything to do with me."  Patient has history of anxiety, depression and schizophrenia. Patient denies homicidal ideations, auditory / visual hallucinations.  He has a history of homelessness and has a legal guardian.  He is well-known to this ED and has been seen 37 times over the past 6 months.  Per Assunta FoundShuvon Rankin, NP, patient recommended inpatient treatment  Diagnosis:  F32.2    Major depressive disorder, Recurrent episode, Severe  Past Medical History:  Past Medical History:  Diagnosis Date  . Anxiety   . Depression   . Diabetes mellitus without complication (HCC)   . Homelessness   . Hypertension   . Schizophrenia (HCC)     History reviewed. No pertinent surgical history.  Family History:  Family History  Problem Relation Age of Onset  . Diabetes Other     Social History:  reports that he has been smoking cigarettes.  He has been smoking about 0.00 packs per day. he has never used smokeless tobacco. He reports that he uses drugs. Drug: Marijuana. He reports that he does not drink alcohol.  Additional Social History:  Alcohol / Drug Use Pain Medications: See MAR Prescriptions: SEE MAR Over the Counter: See MAR History of alcohol / drug use?: Yes Substance #1 Name of Substance 1: CANNABIS 1 - Age of First Use: 15 1 - Amount (size/oz): UNKNOWN 1 - Frequency: UNKNOWN 1 - Duration: UNKNOWN 1 - Last Use  / Amount: few days ago  CIWA: CIWA-Ar BP: 121/67 Pulse Rate: 63 COWS:    Allergies:  Allergies  Allergen Reactions  . Haldol [Haloperidol Lactate] Other (See Comments)    Makes him feel slow    Home Medications:  (Not in a hospital admission)  OB/GYN Status:  No LMP for male patient.  General Assessment Data Assessment unable to be completed: Yes Reason for not completing assessment: Pt will not stay awake [facility] Assessment: In system Is this a Tele or Face-to-Face Assessment?: Tele Assessment Is this an Initial Assessment or a Re-assessment for this encounter?: Initial Assessment Marital status: Single Maiden name: n/a Is patient pregnant?: No Pregnancy Status: No Living Arrangements: Group Home Can pt return to current living arrangement?: Yes Admission Status: Voluntary Is patient capable of signing voluntary admission?: Yes Referral Source: Self/Family/Friend Insurance type: medicaid     Crisis Care Plan Living Arrangements: Group Home Legal Guardian: John Copeland([name] [phone]) Name of Psychiatrist: [facility] Name of Therapist: [facility] [place]  Education Status Is patient currently in school?: No Highest grade of school patient has completed: 8  Risk to self with the past 6 months Suicidal Ideation: Yes-Currently Present(Pt report suicidal intent, drinking 3oz bottle hand sanitize) Has patient been a risk to self within the past 6 months prior to admission? : No Suicidal Intent: Yes-Currently Present(report drank 3oz bottle hand sanitize) Has patient had any suicidal intent within the past 6 months prior to admission? : No Is patient  at risk for suicide?: Yes Suicidal Plan?: Yes-Currently Present Has patient had any suicidal plan within the past 6 months prior to admission? : No Specify Current Suicidal Plan: Pt. drank 3oz bottle hand sanitize, did not disclose additional plan Access to Means: Yes(over  the counter drugs) Specify Access to Suicidal Means: over the counter What has been your use of drugs/alcohol within the last 12 months?: THC Previous Attempts/Gestures: Yes How many times?: 37(patient visited the ED 37 times over the past 6 months) Other Self Harm Risks: none known Triggers for Past Attempts: None known Intentional Self Injurious Behavior: None Family Suicide History: No Recent stressful life event(s): (strained family relationships) Persecutory voices/beliefs?: No Depression: Yes(patient report feeling very depressed) Depression Symptoms: Feeling worthless/self pity, Isolating, Tearfulness Substance abuse history and/or treatment for substance abuse?: No Suicide prevention information given to non-admitted patients: Not applicable  Risk to Others within the past 6 months Homicidal Ideation: No Does patient have any lifetime risk of violence toward others beyond the six months prior to admission? : No Thoughts of Harm to Others: No Current Homicidal Intent: No Current Homicidal Plan: No Access to Homicidal Means: No Identified Victim: none identified History of harm to others?: No Assessment of Violence: None Noted Violent Behavior Description: none noted Does patient have access to weapons?: No Criminal Charges Pending?: No Does patient have a court date: No Is patient on probation?: No  Psychosis Hallucinations: None noted Delusions: None noted  Mental Status Report Appearance/Hygiene: In scrubs Eye Contact: Fair Motor Activity: Freedom of movement Speech: Logical/coherent Level of Consciousness: Alert Mood: Depressed, Sad(patient report feeling depressed ) Affect: Depressed Anxiety Level: None Thought Processes: Relevant Judgement: Partial(suicidal intent / depressed feelings ) Orientation: Person, Place, Time, Situation Obsessive Compulsive Thoughts/Behaviors: None  Cognitive Functioning Concentration: Normal Memory: Recent Intact, Remote  Intact IQ: Average Level of Function: unknown to this Clinical research associate Insight: Poor(depressed w/ SI thoughts and report intent) Impulse Control: Poor Appetite: Good Vegetative Symptoms: None  ADLScreening [facility] Assessment Services) Patient's cognitive ability adequate to safely complete daily activities?: Yes Patient able to express need for assistance with ADLs?: Yes Independently performs ADLs?: Yes (appropriate for developmental age)  Prior Inpatient Therapy Prior Inpatient Therapy: Yes Prior Therapy Dates: 06/2017, multiple admits Prior Therapy Facilty/Provider(s): [facility]; [facility] Reason for Treatment: Schizophrenia  Prior Outpatient Therapy Prior Outpatient Therapy: Yes Prior Therapy Dates: CURRENTLY Prior Therapy Facilty/Provider(s): [facility] Reason for Treatment: MED MGMT Does patient have an ACCT team?: Yes Does patient have Intensive In-House Services?  : No Does patient have [facility] services? : No Does patient have P4[facility] services?: No  ADL Screening (condition at time of admission) Patient's cognitive ability adequate to safely complete daily activities?: Yes Is the patient deaf or have difficulty hearing?: No Does the patient have difficulty seeing, even when wearing glasses/contacts?: No Does the patient have difficulty concentrating, remembering, or making decisions?: No Patient able to express need for assistance with ADLs?: Yes Does the patient have difficulty dressing or bathing?: No Independently performs ADLs?: Yes (appropriate for developmental age) Does the patient have difficulty walking or climbing stairs?: No       Abuse/Neglect Assessment (Assessment to be complete while patient is alone) Abuse/Neglect Assessment Can Be Completed: Yes Physical Abuse: Denies Verbal Abuse: Denies Sexual Abuse: Denies Exploitation of patient/patient's resources: Denies Self-Neglect: Denies     Merchant navy officer (For Healthcare) Does Patient Have a Medical Advance Directive?:  No Would patient like information on creating a medical advance directive?: No - Patient declined  Additional Information 1:1 In Past 12 Months?: No CIRT Risk: No Elopement Risk: No Does patient have medical clearance?: Yes     Disposition:  Disposition Initial Assessment Completed for this Encounter: Yes     Beda Dula [facility] 07/05/2017 8:44 AM

## 2017-07-05 NOTE — ED Notes (Signed)
Called poison control and was advised that patient would be fine. Not toxic to him. No further action required.

## 2017-07-05 NOTE — ED Notes (Signed)
Called kitchen to verify dinner

## 2017-07-05 NOTE — ED Notes (Signed)
Patient was given a snack and drink. 

## 2017-07-05 NOTE — ED Notes (Signed)
Pt inquiring about dinner, tech calling kitchen to check on orders

## 2017-07-05 NOTE — ED Triage Notes (Addendum)
Patient ingested hand sanitizer from a 3oz bottle.  He initially said he was suicidal, but then stated to EMS that he was looking to get drunk.  Upon interview in triage, patient stated that he wanted to hurt himself.

## 2017-07-05 NOTE — ED Notes (Signed)
Pt unable to complete TTS due to being asleep and would not wake up when asked-Monique,RN

## 2017-07-05 NOTE — Progress Notes (Addendum)
Pt. meets criteria for inpatient treatment per Assunta FoundShuvon Rankin, NP.  Referred out to the following hospitals:  [facility]     [facility]  [facility]  [facility]  [facility]  [facility]  [facility]  [facility]  [facility]             Disposition CSW will continue to follow for placement  Timmothy EulerJean T. Kaylyn LimSutter, MSW, LCSWA Disposition Clinical Social Work [phone] (cell) [phone] (office)

## 2017-07-05 NOTE — BHH Counselor (Signed)
Attempted to complete assessment on the pt.  He would not stay awake.  RN and sitter attempted to arouse the pt without success.  Will attempt to do the assessment at a later time.

## 2017-07-05 NOTE — ED Notes (Signed)
Regular Diet was ordered for Lunch. 

## 2017-07-05 NOTE — ED Provider Notes (Signed)
[name] Note   CSN: [id number] Arrival date & time: 07/05/17  0130     History   Chief Complaint Chief Complaint  Patient presents with  . Ingestion  . Suicidal    HPI John Copeland is a 27 y.o. male with a hx of anxiety, depression, schizophrenia, HTN, and DM presents to the Emergency Department complaining of gradual, persistent, progressively worsening suicidal ideation onset sometime earlier today.  Patient reports he ingested hand sanitizer from a 3 ounce bottle.  Patient states that he drank this in an effort to harm himself.  No known aggravating or alleviating factors.  Pt denies HI, auditory or visual hallucinations, chest pain, shortness of breath, fever, chills, abdominal pain, nausea, vomiting, diarrhea.  Record [facility].   He has a history of homelessness and has a legal guardian.  He is well-known to this ED and has been seen 37 times over the past 6 months. .  The history is provided by the patient and medical records. No language interpreter was used.    Past Medical History:  Diagnosis Date  . Anxiety   . Depression   . Diabetes mellitus without complication (HCC)   . Homelessness   . Hypertension   . Schizophrenia [facility](HCC)     Patient Active Problem List   Diagnosis Date Noted  . Left lower lobe pneumonia (HCC) 06/26/2017  . Accidental drug overdose   . Schizophrenia (HCC) 03/29/2017  . 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 Diphenhydramine overdose of undetermined intent 01/10/2017  . Tobacco use disorder 10/07/2016  . Diabetes (HCC) 10/07/2016  . HTN (hypertension) 10/07/2016  . Dyslipidemia 10/07/2016  . Schizoaffective disorder, bipolar type (HCC) 11/30/2015    History reviewed. No pertinent surgical history.     Home Medications    Prior to Admission medications   Medication Sig Start Date End Date Taking? Authorizing Provider  ARIPiprazole (ABILIFY) 15 MG tablet Take 1  tablet (15 mg total) by mouth daily for 14 days. For mood control 07/01/17 07/15/17 Yes Long, Arlyss RepressJoshua G, MD  divalproex (DEPAKOTE) 500 MG DR tablet Take 1 tablet (500 mg total) by mouth every 12 (twelve) hours for 14 days. For mood stabilization 07/01/17 07/15/17 Yes Long, Arlyss RepressJoshua G, MD  amoxicillin (AMOXIL) 500 MG capsule Take 1 capsule (500 mg total) by mouth 3 (three) times daily. Patient not taking: Reported on 07/04/2017 06/28/17   Palumbo, April, MD  atomoxetine (STRATTERA) 18 MG capsule Take 1 capsule (18 mg total) by mouth daily. For ADHD Patient not taking: Reported on 06/26/2017 06/25/17   Armandina StammerNwoko, Agnes I, NP  benzonatate (TESSALON) 100 MG capsule Take 1 capsule (100 mg total) by mouth 3 (three) times daily as needed for cough. Patient not taking: Reported on 06/26/2017 06/24/17   Petrucelli, Lelon MastSamantha R, PA-C  fluticasone (FLONASE) 50 MCG/ACT nasal spray Place 1 spray into both nostrils daily. Patient not taking: Reported on 06/26/2017 06/24/17   Petrucelli, Pleas KochSamantha R, PA-C  ibuprofen (ADVIL,MOTRIN) 800 MG tablet Take 1 tablet (800 mg total) by mouth 3 (three) times daily. Patient not taking: Reported on 06/26/2017 06/24/17   Petrucelli, Pleas KochSamantha R, PA-C  traZODone (DESYREL) 50 MG tablet Take 1 tablet (50 mg total) by mouth at bedtime as needed for sleep. Patient not taking: Reported on 06/26/2017 06/24/17   Sanjuana KavaNwoko, Agnes I, NP    Family History Family History  Problem Relation Age of Onset  . Diabetes Other     Social History  Social History   Tobacco Use  . Smoking status: Current Every Day Smoker    Packs/day: 0.00    Types: Cigarettes  . Smokeless tobacco: Never Used  Substance Use Topics  . Alcohol use: No  . Drug use: Yes    Types: Marijuana    Comment: denies drug use     Allergies   Haldol [haloperidol lactate]   Review of Systems Review of Systems  Constitutional: Negative for appetite change, diaphoresis, fatigue, fever and unexpected weight change.  HENT: Negative for  mouth sores.   Eyes: Negative for visual disturbance.  Respiratory: Negative for cough, chest tightness, shortness of breath and wheezing.   Cardiovascular: Negative for chest pain.  Gastrointestinal: Negative for abdominal pain, constipation, diarrhea, nausea and vomiting.  Endocrine: Negative for polydipsia, polyphagia and polyuria.  Genitourinary: Negative for dysuria, frequency, hematuria and urgency.  Musculoskeletal: Negative for back pain and neck stiffness.  Skin: Negative for rash.  Allergic/Immunologic: Negative for immunocompromised state.  Neurological: Negative for syncope, light-headedness and headaches.  Hematological: Does not bruise/bleed easily.  Psychiatric/Behavioral: Positive for dysphoric mood and suicidal ideas. Negative for sleep disturbance. The patient is not nervous/anxious.      Physical Exam Updated Vital Signs BP (!) 150/89 (BP Location: Right Arm)   Pulse 73   Temp 98.5 F (36.9 C) (Oral)   Resp 18   SpO2 98%   Physical Exam  Constitutional: He appears well-developed and well-nourished. No distress.  HENT:  Head: Normocephalic.  Eyes: Conjunctivae are normal. No scleral icterus.  Neck: Normal range of motion.  Cardiovascular: Normal rate, regular rhythm, normal heart sounds and intact distal pulses.  Pulmonary/Chest: Effort normal and breath sounds normal.  Abdominal: Soft. Bowel sounds are normal.  Musculoskeletal: Normal range of motion.  Neurological: He is alert.  Skin: Skin is warm and dry.  Psychiatric: He expresses suicidal ideation. He expresses no homicidal ideation. He expresses suicidal plans. He expresses no homicidal plans.  Nursing note and vitals reviewed.    ED Treatments / Results  Labs (all labs ordered are listed, but only abnormal results are displayed) Labs Reviewed  COMPREHENSIVE METABOLIC PANEL - Abnormal; Notable for the following components:      Result Value   Glucose, Bld 110 (*)    All other components within  normal limits  ACETAMINOPHEN LEVEL - Abnormal; Notable for the following components:   Acetaminophen (Tylenol), Serum <10 (*)    All other components within normal limits  CBC - Abnormal; Notable for the following components:   Hemoglobin 11.9 (*)    HCT 36.2 (*)    MCV 72.7 (*)    MCH 23.9 (*)    Platelets 412 (*)    All other components within normal limits  ETHANOL  SALICYLATE LEVEL  RAPID URINE DRUG SCREEN, HOSP PERFORMED    Procedures Procedures (including critical care time)  Medications Ordered in ED Medications  traZODone (DESYREL) tablet 50 mg (not administered)  fluticasone (FLONASE) 50 MCG/ACT nasal spray 1 spray (not administered)  ARIPiprazole (ABILIFY) tablet 15 mg (not administered)  divalproex (DEPAKOTE) DR tablet 500 mg (not administered)     Initial Impression / Assessment and Plan / ED Course  I have reviewed the triage vital signs and the nursing notes.  Pertinent labs & imaging results that were available during my care of the patient were reviewed by me and considered in my medical decision making (see chart for details).  Clinical Course as of Jul 05 630  Fri Jul 05, 2017  0548 Poison control recommends no additional treatments or observation period  [HM]    Clinical Course User Index [HM] Alann Avey, Dahlia Client, [place]    Patient here with complaints of suicidal ideation.  Patient drank a bottle of hand sanitizer in an effort to kill himself tonight.  He is well-known to the emergency department but has never previously attempted a hand sanitizer usage.  He is medically cleared after discussion with poison control.  TTS to evaluate.  Final Clinical Impressions(s) / ED Diagnoses   Final diagnoses:  Suicidal ideation    ED Discharge Orders    None       Mardene Sayer Boyd Kerbs 07/05/17 1191    Zadie Rhine, MD 07/05/17 [phone]

## 2017-07-05 NOTE — ED Triage Notes (Signed)
Pt never came back in and isn't outside

## 2017-07-05 NOTE — ED Notes (Signed)
Pt requesting trazodone and Strattera order by [facility] at [facility]

## 2017-07-05 NOTE — ED Notes (Signed)
TT in process

## 2017-07-06 ENCOUNTER — Emergency Department (HOSPITAL_COMMUNITY)
Admission: EM | Admit: 2017-07-06 | Discharge: 2017-07-06 | Disposition: A | Discharge status: Home / Self Care | Payer: Medicaid Other | Source: Home / Self Care | Attending: Emergency Medicine | Admitting: Emergency Medicine

## 2017-07-06 ENCOUNTER — Emergency Department (HOSPITAL_COMMUNITY): Payer: Medicaid Other

## 2017-07-06 ENCOUNTER — Encounter (HOSPITAL_COMMUNITY): Payer: Self-pay | Admitting: Nurse Practitioner

## 2017-07-06 ENCOUNTER — Other Ambulatory Visit: Payer: Self-pay

## 2017-07-06 DIAGNOSIS — J00 Acute nasopharyngitis [common cold]: Secondary | ICD-10-CM

## 2017-07-06 DIAGNOSIS — S5002XA Contusion of left elbow, initial encounter: Secondary | ICD-10-CM

## 2017-07-06 MED ORDER — ATOMOXETINE HCL 18 MG PO CAPS
18.0000 mg | ORAL_CAPSULE | Freq: Every day | ORAL | Status: DC
  Filled 2017-07-06: qty 1

## 2017-07-06 MED ORDER — ONDANSETRON 4 MG PO TBDP
8.0000 mg | ORAL_TABLET | Freq: Three times a day (TID) | ORAL | Status: DC | PRN
  Administered 2017-07-06: 8 mg via ORAL
  Filled 2017-07-06: qty 2

## 2017-07-06 MED ORDER — IBUPROFEN 800 MG PO TABS
800.0000 mg | ORAL_TABLET | Freq: Once | ORAL | Status: AC
  Administered 2017-07-06: 800 mg via ORAL
  Filled 2017-07-06: qty 1

## 2017-07-06 MED ORDER — PSEUDOEPHEDRINE HCL ER 120 MG PO TB12
120.0000 mg | ORAL_TABLET | Freq: Once | ORAL | Status: AC
  Administered 2017-07-06: 120 mg via ORAL
  Filled 2017-07-06: qty 1

## 2017-07-06 MED ORDER — IBUPROFEN 400 MG PO TABS: 400.0000 mg | ORAL_TABLET | Freq: Four times a day (QID) | ORAL | Status: DC | PRN

## 2017-07-06 MED ORDER — PSEUDOEPHEDRINE HCL ER 120 MG PO TB12: 120.0000 mg | ORAL_TABLET | Freq: Two times a day (BID) | ORAL | 0 refills | Status: DC

## 2017-07-06 MED ORDER — ACETAMINOPHEN 500 MG PO TABS
1000.0000 mg | ORAL_TABLET | Freq: Once | ORAL | Status: AC
  Administered 2017-07-06: 1000 mg via ORAL
  Filled 2017-07-06: qty 2

## 2017-07-06 NOTE — ED Notes (Signed)
Pt requesting "something for my feet". Pt's feet noted to be dry - lotion given.

## 2017-07-06 NOTE — ED Notes (Addendum)
Pt at nurse's station, asking if he can sign himself out. When asked why, pt stated that he just really wants to get back on his medication, and if he can't get his ADHD meds, then he just wants to go. Reminded pt that we've restarted his other medications, and that [facility]. Stated that he would need to speak with the EDP prior to leaving, but encouraged him to stay. Pt agreeing to remain at this time.

## 2017-07-06 NOTE — ED Notes (Signed)
Dr Steinl in w/pt. 

## 2017-07-06 NOTE — ED Notes (Signed)
Patient using phone at this time.

## 2017-07-06 NOTE — ED Notes (Addendum)
Let pt know that EDP had placed an order for strattera, beginning later in the morning. Pt agreeable and no longer voices desire to leave AMA. Pt denies any SI or plan for self-injury. States that he "just freaked out" earlier with the hand sanitizer. Says that the shelter "wouldn't let him in, so he had nowhere to go."
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
## 2017-07-06 NOTE — ED Notes (Signed)
Outpt resources discussed and given - voiced understanding to follow up w/[facility].

## 2017-07-06 NOTE — Discharge Instructions (Signed)
Take tylenol 2 pills 4 times a day and motrin 4 pills 3 times a day.  Drink plenty of fluids.  Return for worsening shortness of breath, headache, confusion. Follow up with your family doctor.   
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
## 2017-07-06 NOTE — Discharge Instructions (Signed)
It was our pleasure to provide your ER care today - we hope that you feel better.  Access refills of your medications via primary care doctor and/or your mental health provider.   See resource guide provided for community support services.   For mental health care and/or crisis, follow up directly with [facility] in the next 1-2 days.  Return to ER if worse, new symptoms, or other medical emergency.

## 2017-07-06 NOTE — ED Provider Notes (Signed)
Reassess pt  - pt is awake and alert. He requests d/c to home. States feels fine.   Patient has normal mood and affect. He does not voice any thoughts of harm to self or others. No SI.   He denies overdosing or doing anything that would hurt himself.   He indicates he plans to go to shelter.   Will refer to close outpt f/u.   Patient currently appears stable for d/c.   Vitals:   07/05/17 2145 07/06/17 0628  BP: 139/86 115/81  Pulse: (!) 57 67  Resp: 18 16  Temp: 97.7 F (36.5 C) 98.8 F (37.1 C)  SpO2: 92% 99%      Cathren LaineSteinl, Raynard Mapps, MD 07/06/17 [phone]

## 2017-07-06 NOTE — ED Notes (Signed)
Pt asking this RN if he "can be put back on his ADHD meds...it starts with a "S". Looking at his medication list, I believe he's referring to strattera. [facility].

## 2017-07-07 ENCOUNTER — Ambulatory Visit (HOSPITAL_COMMUNITY)
Admission: AD | Admit: 2017-07-07 | Discharge: 2017-07-07 | Disposition: A | Discharge status: Home / Self Care | Payer: Medicaid Other | Attending: Psychiatry | Admitting: Psychiatry

## 2017-07-07 ENCOUNTER — Emergency Department (HOSPITAL_COMMUNITY)
Admission: EM | Admit: 2017-07-07 | Discharge: 2017-07-07 | Discharge status: Against Medical Advice | Payer: Medicaid Other

## 2017-07-07 DIAGNOSIS — F329 Major depressive disorder, single episode, unspecified: Secondary | ICD-10-CM | POA: Diagnosis not present

## 2017-07-07 DIAGNOSIS — E119 Type 2 diabetes mellitus without complications: Secondary | ICD-10-CM | POA: Diagnosis not present

## 2017-07-07 DIAGNOSIS — F209 Schizophrenia, unspecified: Secondary | ICD-10-CM | POA: Insufficient documentation

## 2017-07-07 DIAGNOSIS — Z1330 Encounter for screening examination for mental health and behavioral disorders, unspecified: Secondary | ICD-10-CM | POA: Diagnosis present

## 2017-07-07 DIAGNOSIS — F419 Anxiety disorder, unspecified: Secondary | ICD-10-CM | POA: Diagnosis not present

## 2017-07-07 NOTE — BH Assessment (Addendum)
Assessment Note  John MuffJoshua Omar Copeland is an 27 y.o. male who presents voluntarily to [facility] alone reporting symptoms of depression and being out of his medications.  Per pt, he came [facility] from [facility] because he was tired of waiting there Pt has a history of Schizophrenia and depression.  Pt reports medication Depakote, Abilify and Strattera and states he hasn't had his medications in 3 days.  Pt denies current suicidal ideation and denies having a plan.  Pt denies past attempts. Pt acknowledges symptoms including: sadness, fatigue, low self esteem, tearfulness, isolating, lack of motivation, irritability, difficulty concentrating, helplessness, hopelessness, sleeping less and eating less.  Pt denies homicidal ideation/ history of violence. Pt denies auditory or visual hallucinations or other psychotic symptoms. Pt states current stressors include feeling depressed, being out of medication, being homeless and not having access to his SSI check.   Pt is homeless and [facility], and supports include his uncle Bruce. Pt denies History of abuse and trauma. Pt reports there is a family history of mental health issues. Pt is currently unemployed. Pt has poor insight and partial judgment. Pt's memory is intact.  Pt denies legal history. Pt's OP history includes his ACCT team with [facility]. IP history includes most recent admission [facility].  Pt denies alcohol and reports substance abuse of marijuana.  Pt is dressed in scrubs, alert, oriented x4 with normal speech and normal motor behavior. Eye contact is good. Pt's mood is depressed and affect is depressed. Affect is congruent with mood. Thought process is coherent, relevant and tangential. There is no indication Pt is currently responding to internal stimuli or experiencing delusional thought content. Pt was cooperative throughout assessment. Pt is currently able to contract for  safety outside the hospital and would like to have his medications refilled.  This Clinical research associatewriter contacted [facility] [employer] services [phone]) to make them aware that the pt has been to [facility] and [facility].  Writer also attempted to contact the pt's ACCT team [phone]), no answer, unable to leave voicemail.  Writer also gave those 2 phone numbers to the pt as well as other numbers that the pt requested.   Diagnosis: F20.9 Schizophrenia  Past Medical History:  Past Medical History:  Diagnosis Date  . Anxiety   . Depression   . Diabetes mellitus without complication (HCC)   . Homelessness   . Hypertension   . Schizophrenia (HCC)     No past surgical history on file.  Family History:  Family History  Problem Relation Age of Onset  . Diabetes Other     Social History:  reports that he has been smoking cigarettes.  He has been smoking about 0.00 packs per day. he has never used smokeless tobacco. He reports that he uses drugs. Drug: Marijuana. He reports that he does not drink alcohol.  Additional Social History:  Alcohol / Drug Use Pain Medications: See MAR Prescriptions: SEE MAR Over the Counter: See MAR History of alcohol / drug use?: Yes Substance #1 Name of Substance 1: Marijuana 1 - Age of First Use: 15 1 - Amount (size/oz): UNKNOWN 1 - Frequency: UNKNOWN 1 - Duration: UNKNOWN 1 - Last Use / Amount: 4 days ago  CIWA:   COWS:    Allergies:  Allergies  Allergen Reactions  . Haldol [Haloperidol Lactate] Other (See Comments)    Makes him feel slow    Home Medications:  (Not in a  hospital admission)  OB/GYN Status:  No LMP for male patient.  General Assessment Data Location of Assessment: [facility] Services TTS Assessment: In system Is this a Tele or Face-to-Face Assessment?: Face-to-Face Is this an Initial Assessment or a Re-assessment for this encounter?: Initial Assessment Marital status: Single Maiden name: NA Is patient pregnant?:  No Pregnancy Status: No Living Arrangements: Other (Comment)(Homeless) Can pt return to current living arrangement?: Yes Admission Status: Voluntary Is patient capable of signing voluntary admission?: Yes Referral Source: Self/Family/Friend Insurance type: [employer] Medicaid  Medical Screening Exam [facility] Walk-in ONLY) Medical Exam completed: Yes  Crisis Care Plan Living Arrangements: Other (Comment)(Homeless) Legal Guardian: Other:([place] DSS-Marty per [employer]) Name of Psychiatrist: [facility] Name of Therapist: [facility] [place]  Education Status Is patient currently in school?: No Highest grade of school patient has completed: 8th grade  Risk to self with the past 6 months Suicidal Ideation: No Has patient been a risk to self within the past 6 months prior to admission? : No Suicidal Intent: No Has patient had any suicidal intent within the past 6 months prior to admission? : No Is patient at risk for suicide?: No Suicidal Plan?: No Has patient had any suicidal plan within the past 6 months prior to admission? : No Specify Current Suicidal Plan: Pt denies having a plan and states he doesn't want to hurt himself, he's just very sad Access to Means: No Specify Access to Suicidal Means: Pt denies What has been your use of drugs/alcohol within the last 12 months?: Marijuana Previous Attempts/Gestures: No How many times?: 0 Other Self Harm Risks: Pt denies Triggers for Past Attempts: None known Intentional Self Injurious Behavior: None Family Suicide History: No Recent stressful life event(s): Loss (Comment), Other (Comment)(feeling depressed, no meds and homelessness) Persecutory voices/beliefs?: No Depression: Yes Depression Symptoms: Tearfulness, Isolating, Fatigue, Feeling angry/irritable, Feeling worthless/self pity Substance abuse history and/or treatment for substance abuse?: No Suicide prevention information given to non-admitted patients: Not  applicable  Risk to Others within the past 6 months Homicidal Ideation: No Does patient have any lifetime risk of violence toward others beyond the six months prior to admission? : No Thoughts of Harm to Others: No Current Homicidal Intent: No Current Homicidal Plan: No Access to Homicidal Means: No Identified Victim: Pt denies History of harm to others?: No Assessment of Violence: None Noted Violent Behavior Description: Pt denies Does patient have access to weapons?: No Criminal Charges Pending?: No Does patient have a court date: No Is patient on probation?: No  Psychosis Hallucinations: None noted Delusions: None noted  Mental Status Report Appearance/Hygiene: In scrubs Eye Contact: Good Motor Activity: Freedom of movement Speech: Logical/coherent, Tangential Level of Consciousness: Alert Mood: Depressed Affect: Depressed Anxiety Level: None Thought Processes: Coherent, Relevant, Tangential Judgement: Partial Orientation: Person, Place, Time, Situation, Appropriate for developmental age Obsessive Compulsive Thoughts/Behaviors: None  Cognitive Functioning Concentration: Normal Memory: Recent Intact, Remote Intact IQ: Average Level of Function: UTA Insight: Poor Impulse Control: Poor Appetite: Fair Weight Loss: 0 Weight Gain: 0 Sleep: No Change Total Hours of Sleep: 8 Vegetative Symptoms: None  ADLScreening [facility] Assessment Services) Patient's cognitive ability adequate to safely complete daily activities?: Yes Patient able to express need for assistance with ADLs?: Yes Independently performs ADLs?: Yes (appropriate for developmental age)  Prior Inpatient Therapy Prior Inpatient Therapy: Yes Prior Therapy Dates: 06/2017, multiple admits Prior Therapy Facilty/Provider(s): [facility]; [facility] Reason for Treatment: Schizophrenia  Prior Outpatient Therapy Prior Outpatient Therapy: Yes Prior Therapy Dates: CURRENTLY Prior Therapy  Facilty/Provider(s): [facility] Reason for Treatment: MED MGMT Does patient have an ACCT team?: Yes Does patient have Intensive In-House Services?  : No Does patient have [facility] services? : No Does patient have P4CC services?: No  ADL Screening (condition at time of admission) Patient's cognitive ability adequate to safely complete daily activities?: Yes Is the patient deaf or have difficulty hearing?: No Does the patient have difficulty seeing, even when wearing glasses/contacts?: No Does the patient have difficulty concentrating, remembering, or making decisions?: No Patient able to express need for assistance with ADLs?: Yes Independently performs ADLs?: Yes (appropriate for developmental age) Does the patient have difficulty walking or climbing stairs?: No Weakness of Legs: None Weakness of Arms/Hands: None       Abuse/Neglect Assessment (Assessment to be complete while patient is alone) Abuse/Neglect Assessment Can Be Completed: Yes Physical Abuse: Denies Verbal Abuse: Denies Sexual Abuse: Denies Exploitation of patient/patient's resources: Denies Self-Neglect: Denies     Merchant navy officer (For Healthcare) Does Patient Have a Medical Advance Directive?: No Would patient like information on creating a medical advance directive?: No - Patient declined    Additional Information 1:1 In Past 12 Months?: No CIRT Risk: No Elopement Risk: No Does patient have medical clearance?: Yes     Disposition: Gave clinical report to Nira Conn, NP who stated the pt doesn't meet criteria for inpatient treatment and recommended discharge with phone number of his ACCT Team, guardian, church and [place] as the pt requested. Disposition Initial Assessment Completed for this Encounter: Yes Disposition of Patient: Discharge with Outpatient Resources(Pt to follow up with Vesta Mixer and ACTT team)  On Site Evaluation by:   Reviewed with Physician:    Annamaria Boots, MS, [facility] Therapeutic Triage  Specialist  Annamaria Boots 07/07/2017 6:13 AM

## 2017-07-07 NOTE — H&P (Signed)
Behavioral Health Medical Screening Exam  John Copeland is an 27 y.o. male who [facility]. Requests medications. " I only want Strattera to help me focus." Denies SI/HI/AVH.   Total Time spent with patient: 45 minutes  Psychiatric Specialty Exam: Physical Exam  Constitutional: He is oriented to person, place, and time. He appears well-developed and well-nourished. No distress.  HENT:  Head: Normocephalic and atraumatic.  Right Ear: External ear normal.  Left Ear: External ear normal.  Eyes: Conjunctivae are normal. Right eye exhibits no discharge. Left eye exhibits no discharge. No scleral icterus.  Cardiovascular: Normal rate.  Respiratory: Effort normal. No respiratory distress.  Musculoskeletal: Normal range of motion. He exhibits edema.       Arms: Neurological: He is alert and oriented to person, place, and time.  Skin: Skin is warm and dry. He is not diaphoretic.  Psychiatric: His behavior is normal. His speech is tangential. He is not slowed, not withdrawn and not actively hallucinating. Thought content is not paranoid and not delusional. He expresses impulsivity and inappropriate judgment. He expresses no homicidal and no suicidal ideation.    Review of Systems  Constitutional: Negative for chills, fever and weight loss.  Psychiatric/Behavioral: Positive for depression. Negative for hallucinations, memory loss, substance abuse and suicidal ideas. The patient is nervous/anxious and has insomnia.     Blood pressure 132/82, pulse 91, temperature 98.6 F (37 C), resp. rate 16, SpO2 100 %.There is no height or weight on file to calculate BMI.  General Appearance: Casual and Disheveled  Eye Contact:  Good  Speech:  Clear and Coherent and Normal Rate  Volume:  Normal  Mood:  Depressed and Worthless  Affect:  Appropriate, Congruent and Depressed  Thought Process:  Coherent  Orientation:  Full (Time, Place, and Person)  Thought Content:  Hallucinations:  None, Rumination and Tangential  Suicidal Thoughts:  No  Homicidal Thoughts:  No  Memory:  Immediate;   Good Recent;   Fair  Judgement:  Intact  Insight:  Fair  Psychomotor Activity:  Normal  Concentration: Concentration: Fair and Attention Span: Fair  Recall:  [employer]  Fund of Knowledge:Fair  Language: Good  Akathisia:  No  Handed:  Right  AIMS (if indicated):     Assets:  ArchitectCommunication Skills Financial Resources/Insurance Leisure Time Physical Health Resilience  Sleep:       Musculoskeletal: Strength & Muscle Tone: within normal limits Gait & Station: normal   Blood pressure 132/82, pulse 91, temperature 98.6 F (37 C), resp. rate 16, SpO2 100 %.  Recommendations:  Based on my evaluation the patient does not appear to have an emergency medical condition.  No evidence of imminent risk to self or others at present.   Patient does not meet criteria for psychiatric inpatient admission. Supportive therapy provided about ongoing stressors. Discussed crisis plan, support from social network, calling 911, coming to the Emergency Department, and calling Suicide Hotline. Provided with numbers for his ACT team, Legal Guardian, Church, and for [employer] [facility] (needs to pick up glasses). TTS contacted legal gaurdian, but was unable to get in touch with ACT team. Instructed to [facility] and his ACT team.Patient is agreeable to plan.    Jackelyn PolingJason A Berry, NP 07/07/2017, 6:27 AM

## 2017-07-08 ENCOUNTER — Emergency Department (HOSPITAL_COMMUNITY)
Admission: EM | Admit: 2017-07-08 | Discharge: 2017-07-09 | Disposition: A | Discharge status: Home / Self Care | Payer: Medicaid Other | Attending: Emergency Medicine | Admitting: Emergency Medicine

## 2017-07-08 ENCOUNTER — Encounter (HOSPITAL_COMMUNITY): Payer: Self-pay | Admitting: *Deleted

## 2017-07-08 ENCOUNTER — Other Ambulatory Visit: Payer: Self-pay

## 2017-07-08 DIAGNOSIS — F329 Major depressive disorder, single episode, unspecified: Secondary | ICD-10-CM | POA: Insufficient documentation

## 2017-07-08 DIAGNOSIS — R45851 Suicidal ideations: Secondary | ICD-10-CM | POA: Diagnosis not present

## 2017-07-08 DIAGNOSIS — F32A Depression, unspecified: Secondary | ICD-10-CM

## 2017-07-08 DIAGNOSIS — I10 Essential (primary) hypertension: Secondary | ICD-10-CM | POA: Diagnosis not present

## 2017-07-08 DIAGNOSIS — E119 Type 2 diabetes mellitus without complications: Secondary | ICD-10-CM | POA: Insufficient documentation

## 2017-07-08 DIAGNOSIS — F17210 Nicotine dependence, cigarettes, uncomplicated: Secondary | ICD-10-CM | POA: Diagnosis not present

## 2017-07-08 LAB — COMPREHENSIVE METABOLIC PANEL
ALT: 27 U/L (ref 17–63)
ANION GAP: 11 (ref 5–15)
AST: 36 U/L (ref 15–41)
Albumin: 3.8 g/dL (ref 3.5–5.0)
Alkaline Phosphatase: 54 U/L (ref 38–126)
BILIRUBIN TOTAL: 0.6 mg/dL (ref 0.3–1.2)
BUN: 13 mg/dL (ref 6–20)
CO2: 25 mmol/L (ref 22–32)
Calcium: 9.7 mg/dL (ref 8.9–10.3)
Chloride: 101 mmol/L (ref 101–111)
Creatinine, Ser: 0.87 mg/dL (ref 0.61–1.24)
GFR calc Af Amer: >60 mL/min (ref >60)
Glucose, Bld: 94 mg/dL (ref 65–99)
POTASSIUM: 4 mmol/L (ref 3.5–5.1)
Sodium: 137 mmol/L (ref 135–145)
TOTAL PROTEIN: 7.3 g/dL (ref 6.5–8.1)

## 2017-07-08 LAB — CBC
HCT: 37.8 % — ABNORMAL LOW (ref 39.0–52.0)
Hemoglobin: 12.2 g/dL — ABNORMAL LOW (ref 13.0–17.0)
MCH: 23.4 pg — ABNORMAL LOW (ref 26.0–34.0)
MCHC: 32.3 g/dL (ref 30.0–36.0)
MCV: 72.4 fL — ABNORMAL LOW (ref 78.0–100.0)
PLATELETS: 373 10*3/uL (ref 150–400)
RBC: 5.22 MIL/uL (ref 4.22–5.81)
RDW: 14.4 % (ref 11.5–15.5)
WBC: 4.5 10*3/uL (ref 4.0–10.5)

## 2017-07-08 LAB — ACETAMINOPHEN LEVEL: Acetaminophen (Tylenol), Serum: <10 ug/mL — ABNORMAL LOW (ref 10–30)

## 2017-07-08 LAB — SALICYLATE LEVEL

## 2017-07-08 LAB — ETHANOL: Alcohol, Ethyl (B): <10 mg/dL (ref <10)

## 2017-07-08 MED ORDER — ONDANSETRON 4 MG PO TBDP: 4.0000 mg | ORAL_TABLET | Freq: Three times a day (TID) | ORAL | Status: DC | PRN

## 2017-07-08 MED ORDER — ACETAMINOPHEN 325 MG PO TABS: 650.0000 mg | ORAL_TABLET | Freq: Four times a day (QID) | ORAL | Status: DC | PRN

## 2017-07-08 NOTE — ED Triage Notes (Signed)
Pt arrived by gpd, reports having suicidal thoughts. Calm and cooperative at triage.

## 2017-07-08 NOTE — ED Provider Notes (Addendum)
[name] EMERGENCY DEPARTMENT Provider Note   CSN: [id number] Arrival date & time: 07/08/17  1638     History   Chief Complaint Chief Complaint  Patient presents with  . Suicidal    HPI John Copeland is a 27 y.o. male.  Patient with PMH of schizophrenia, depression, anxiety, DM, and homelessness presents to the ED with a chief complaint of suicidal thoughts.  He states that today he has been feeling very depressed and feels more like he wants to kill himself.  He states that he wants to slit his wrists.  He states that he has attempted this before.  He denies any drug or alcohol use.  He denies any physical complaints.   The history is provided by the patient. No language interpreter was used.    Past Medical History:  Diagnosis Date  . Anxiety   . Depression   . Diabetes mellitus without complication (HCC)   . Homelessness   . Hypertension   . Schizophrenia [facility])     Patient Active Problem List   Diagnosis Date Noted  . Left lower lobe pneumonia (HCC) 06/26/2017  . Accidental drug overdose   . Schizophrenia (HCC) 03/29/2017  . 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 Diphenhydramine overdose of undetermined intent 01/10/2017  . Tobacco use disorder 10/07/2016  . Diabetes (HCC) 10/07/2016  . HTN (hypertension) 10/07/2016  . Dyslipidemia 10/07/2016  . Schizoaffective disorder, bipolar type (HCC) 11/30/2015    History reviewed. No pertinent surgical history.     Home Medications    Prior to Admission medications   Medication Sig Start Date End Date Taking? Authorizing Provider  amoxicillin (AMOXIL) 500 MG capsule Take 1 capsule (500 mg total) by mouth 3 (three) times daily. Patient not taking: Reported on 07/04/2017 06/28/17   Palumbo, April, MD  ARIPiprazole (ABILIFY) 15 MG tablet Take 1 tablet (15 mg total) by mouth daily for 14 days. For mood control 07/01/17 07/15/17  Long, Arlyss Repress, MD  atomoxetine (STRATTERA) 18 MG capsule Take 1 capsule (18 mg  total) by mouth daily. For ADHD Patient not taking: Reported on 06/26/2017 06/25/17   Armandina Stammer I, NP  benzonatate (TESSALON) 100 MG capsule Take 1 capsule (100 mg total) by mouth 3 (three) times daily as needed for cough. Patient not taking: Reported on 06/26/2017 06/24/17   Petrucelli, Lelon Mast R, PA-C  divalproex (DEPAKOTE) 500 MG DR tablet Take 1 tablet (500 mg total) by mouth every 12 (twelve) hours for 14 days. For mood stabilization 07/01/17 07/15/17  Long, Arlyss Repress, MD  fluticasone (FLONASE) 50 MCG/ACT nasal spray Place 1 spray into both nostrils daily. Patient not taking: Reported on 06/26/2017 06/24/17   Petrucelli, Pleas Koch, PA-C  ibuprofen (ADVIL,MOTRIN) 800 MG tablet Take 1 tablet (800 mg total) by mouth 3 (three) times daily. Patient not taking: Reported on 06/26/2017 06/24/17   Petrucelli, Pleas Koch, PA-C  pseudoephedrine (SUDAFED 12 HOUR) 120 MG 12 hr tablet Take 1 tablet (120 mg total) by mouth 2 (two) times daily. 07/06/17   Melene Plan, DO  traZODone (DESYREL) 50 MG tablet Take 1 tablet (50 mg total) by mouth at bedtime as needed for sleep. Patient not taking: Reported on 06/26/2017 06/24/17   Sanjuana Kava, NP    Family History Family History  Problem Relation Age of Onset  . Diabetes Other     Social History Social History   Tobacco Use  . Smoking status: Current Every Day Smoker    Packs/day: 0.00  Types: Cigarettes  . Smokeless tobacco: Never Used  Substance Use Topics  . Alcohol use: No  . Drug use: Yes    Types: Marijuana    Comment: denies drug use     Allergies   Haldol [haloperidol lactate]   Review of Systems Review of Systems  All other systems reviewed and are negative.    Physical Exam Updated Vital Signs BP (!) 133/53   Pulse 78   Temp 98.5 F (36.9 C) (Oral)   Resp 19   SpO2 99%   Physical Exam  Constitutional: He is oriented to person, place, and time. He appears well-developed and well-nourished.  HENT:  Head: Normocephalic and  atraumatic.  Eyes: Conjunctivae and EOM are normal. Pupils are equal, round, and reactive to light. Right eye exhibits no discharge. Left eye exhibits no discharge. No scleral icterus.  Neck: Normal range of motion. Neck supple. No JVD present.  Cardiovascular: Normal rate, regular rhythm and normal heart sounds. Exam reveals no gallop and no friction rub.  No murmur heard. Pulmonary/Chest: Effort normal and breath sounds normal. No respiratory distress. He has no wheezes. He has no rales. He exhibits no tenderness.  Abdominal: Soft. He exhibits no distension and no mass. There is no tenderness. There is no rebound and no guarding.  Musculoskeletal: Normal range of motion. He exhibits no edema or tenderness.  Neurological: He is alert and oriented to person, place, and time.  Skin: Skin is warm and dry.  Psychiatric: He has a normal mood and affect. His behavior is normal. Judgment and thought content normal.  Nursing note and vitals reviewed.    ED Treatments / Results  Labs (all labs ordered are listed, but only abnormal results are displayed) Labs Reviewed  ACETAMINOPHEN LEVEL - Abnormal; Notable for the following components:      Result Value   Acetaminophen (Tylenol), Serum <10 (*)    All other components within normal limits  CBC - Abnormal; Notable for the following components:   Hemoglobin 12.2 (*)    HCT 37.8 (*)    MCV 72.4 (*)    MCH 23.4 (*)    All other components within normal limits  COMPREHENSIVE METABOLIC PANEL  ETHANOL  SALICYLATE LEVEL  RAPID URINE DRUG SCREEN, HOSP PERFORMED    EKG  EKG Interpretation None       Radiology No results found.  Procedures Procedures (including critical care time)  Medications Ordered in ED Medications - No data to display   Initial Impression / Assessment and Plan / ED Course  I have reviewed the triage vital signs and the nursing notes.  Pertinent labs & imaging results that were available during my care of the  patient were reviewed by me and considered in my medical decision making (see chart for details).     Patient with suicidal thoughts.  States he wants to slit his wrists.  No physical complaints.  Labs are reassuring.    TTS consult pending.  Medically clear.  3:58 AM Seen by TTS, who believes patient to be low risk for SI.  TTS discussed with psych APP, who advised either discharge now or at 7am.  Patient has been seen 40 times in the last 6 months.   Will discharge.  Left message with patient's guardianship service.  Final Clinical Impressions(s) / ED Diagnoses   Final diagnoses:  Depression, unspecified depression type  Suicidal thoughts    ED Discharge Orders    None  Roxy HorsemanBrowning, Robertt Buda, PA-C 07/08/17 2222    Roxy HorsemanBrowning, Romen Yutzy, PA-C 07/09/17 0400    Eber HongMiller, Brian, MD 07/09/17 1356

## 2017-07-08 NOTE — ED Notes (Signed)
ED Provider at bedside. 

## 2017-07-08 NOTE — ED Notes (Signed)
Pt refusing to be much more than minimally interactive with PA and RN when attempting to assess him. States "I'm tired." Lays back down and mumbles responses.

## 2017-07-08 NOTE — ED Notes (Signed)
Lunch Tray Ordered @ 1817-per RN-called by Cate Oravec 

## 2017-07-08 NOTE — Progress Notes (Signed)
CSW called the APS social worker assigned to pt's case, Arnetha CourserCarol Wright, [phone]. CSW left voice mail for worker that pt is here in the ED.   Montine CircleKelsy Jahzir Strohmeier, Silverio LayLCSWA [facility]  [phone]

## 2017-07-08 NOTE — Congregational Nurse Program (Signed)
Congregational Nurse Program Note  Date of Encounter: 07/08/2017  Past Medical History: Past Medical History:  Diagnosis Date  . Anxiety   . Depression   . Diabetes mellitus without complication (HCC)   . Homelessness   . Hypertension   . Schizophrenia [facility](HCC)     Encounter Details: CNP Questionnaire - 07/08/17 1154      Questionnaire   Patient Status  Not Applicable    Race  Black or African American    Location Patient Served At  Not Applicable    Insurance  Medicaid    Uninsured  Not Applicable    Food  Yes, have food insecurities;Within past 12 months, worried food would run out with no money to buy more    Housing/Utilities  No permanent housing    Transportation  Yes, need transportation assistance    Interpersonal Safety  No, do not feel physically and emotionally safe where you currently live    Medication  Yes, have medication insecurities    Medical Provider  Yes    Referrals  Area Agency;Other    ED Visit Averted  Not Applicable    Life-Saving Intervention Made  Not Applicable      [facility] multiple visits to the ED since Friday.  Mood less anxious.  Less hyperactive this morning as well as less agitated.  Abilify 5 mg and Depakote 500 mg ER po given.  Client states he "lost" the medication caddy provided for him over the week-end. Client's PM Depakote dose given to him to take at bedtime.  Client instructed to return to see me tomorrow for his medication.  After reviewing the medical record, it was noted client had taken an entire box of cold medication.  I asked him where he got the medication.  Client states he asked a customer at [employer] to purchase it for him which they did.  Discussed with client the dangers of his actions.  Encouraged client to remain at the Shelter during the night so he does not loose his spot.  TC to Arnetha Courserarol Wright with APS.  LM for a call back.  I want to have an update  TC to [facility] in [place].   LM for a call back.  Client states he has been in contact with them and he thinks they will take him back.  He states he wants to go there.

## 2017-07-09 NOTE — ED Notes (Signed)
Pt to nurse's station. "The guy said he's gonna discharge me at 4am, even though I told the bastard that I was still having suicidal thoughts. I just want to leave now." Told pt that I needed to discuss with my PA, and that until he made the decision to discharge, the pt should make use of the comfortable bed he has at the moment. Pt agreed and returned to room.

## 2017-07-09 NOTE — ED Notes (Signed)
Pt went to bathroom, but did not collect a sample. Reminded of need to collect and left container on counter, next to door. Pt noted understanding.

## 2017-07-09 NOTE — ED Notes (Signed)
Regular breakfast tray ordered.  

## 2017-07-09 NOTE — ED Notes (Signed)
Pt belongings inventoried and recorded. Two bags placed in Locker #5. No valuables with Security, and no home medications with Pharmacy. Pt sleeping and refusing to sign.

## 2017-07-09 NOTE — ED Notes (Signed)
TTS device at bedside. Assessment underway. Pt refusing to uncover his head and [facility] clearly [facility].

## 2017-07-09 NOTE — BH Assessment (Addendum)
Tele Assessment Note   Patient Name: John Copeland MRN: [id number] Referring Physician: Roxy Horseman, PA Location of Patient: [facility] Location of Provider: Behavioral Health TTS Department  John Copeland is an 27 y.o. male.  -Clinician reviewed note by Roxy Horseman, PA.  Patient with PMH of schizophrenia, depression, anxiety, DM, and homelessness presents to the ED with a chief complaint of suicidal thoughts.  He states that today he has been feeling very depressed and feels more like he wants to kill himself.  He states that he wants to slit his wrists.  He states that he has attempted this before.  He denies any drug or alcohol use.  Patient has his blanket over is head and says "I don't want to talk."  "I want to do this later" when he is told he needs to have his assessment at this time. Patient has to be told by this clinician and nurse Marquita Palms to participate in assessment.  Clinician told patient that if he did not participate, he may be discharged.    Patient remains with head covered.  When asked if he is suicidal patient hesitates and says "yes, I guess so."  Patient says he would cut his wrists.  Clinician asked if he felt the same way when he was assessed on 02/22 and 02/24.  Patient did not say anything.  Clinician pointed out that patient has had 38 visits to ED in last 6 months and that this is averaging out to a visit about every 4 days.  Patient denies HI.  When asked if he was having A/V hallucinations, patient does not answer.  Patient does not evidence any response to internal stimuli.  Patient says he has guardianship through [place] DSS and a person named Sharl Ma there.  There is also a organization called [employer] Guardianship services [phone].  He says he has ACTT team services through [employer] services but cannot remember the last time he saw them.  Patient says that if he was able to get his SSI check regularly, he would be okay.  Patient says he  does not have a cell phone however.  He had a welfare phone "awhile back but I don't have it anymore."    Clinician pointed out to patient that when he was assessed on 02/24, he was given the phone number to [facility] Guardianship and [facility].  When asked if he had contacted them, he said "no" and when asked why, he says "I don't know."  Patient then became irritated and said "I'll just leave, ya'll can discharge me."  He then got up and exited the room.  Patient care was discussed with Donell Sievert, PA.  He said that patient could be discharged now or could wait until EDP at [facility] was ready to discharge patient.  Clinician discussed patient with Roxy Horseman, PA.  Clinician did attempt to contact [facility] ACTT team but was unable to leave a message.  Roxy Horseman, PA said he would discharge patient.  Diagnosis: F20.9 Schizophrenia  Past Medical History:  Past Medical History:  Diagnosis Date  . Anxiety   . Depression   . Diabetes mellitus without complication (HCC)   . Homelessness   . Hypertension   . Schizophrenia (HCC)     History reviewed. No pertinent surgical history.  Family History:  Family History  Problem Relation Age of Onset  . Diabetes Other     Social History:  reports that he has been smoking cigarettes.  He has been smoking about 0.00 packs per day. he has never used smokeless tobacco. He reports that he uses drugs. Drug: Marijuana. He reports that he does not drink alcohol.  Additional Social History:  Alcohol / Drug Use Pain Medications: See PTA medication list Prescriptions: See PTA medication list Over the Counter: See PTA medication list History of alcohol / drug use?: Yes Substance #1 Name of Substance 1: Marijuana 1 - Age of First Use: teens 1 - Amount (size/oz): Varies 1 - Frequency: Varies 1 - Duration: on-going 1 - Last Use / Amount: Unknown  CIWA: CIWA-Ar BP: (!) 133/53 Pulse Rate: 78 COWS:    Allergies:  Allergies  Allergen  Reactions  . Haldol [Haloperidol Lactate] Other (See Comments)    Makes him feel slow    Home Medications:  (Not in a hospital admission)  OB/GYN Status:  No LMP for male patient.  General Assessment Data [facility] Assessment: In system Is this a Tele or Face-to-Face Assessment?: Tele Assessment Is this an Initial Assessment or a Re-assessment for this encounter?: Initial Assessment Marital status: Single Is patient pregnant?: No Pregnancy Status: No Living Arrangements: Other (Comment)(Pt is homeless.) Can pt return to current living arrangement?: Yes Admission Status: Voluntary Is patient capable of signing voluntary admission?: Yes Referral Source: Self/Family/Friend Insurance type: MCD     Crisis Care Plan Living Arrangements: Other (Comment)(Pt is homeless.) Legal Guardian: Other:([place] Producer, television/film/video(Marty) ) Name of Psychiatrist: [facility] Name of Therapist: [facility] - [place]  Education Status Is patient currently in school?: No Highest grade of school patient has completed: 8th grade  Risk to self with the past 6 months Suicidal Ideation: Yes-Currently Present Has patient been a risk to self within the past 6 months prior to admission? : No Suicidal Intent: No Has patient had any suicidal intent within the past 6 months prior to admission? : No Is patient at risk for suicide?: No Suicidal Plan?: Yes-Currently Present(Told PA he would cut his wrists) Has patient had any suicidal plan within the past 6 months prior to admission? : No Specify Current Suicidal Plan: Told PA he would cut wrists. Access to Means: Yes Specify Access to Suicidal Means: sharps What has been your use of drugs/alcohol within the last 12 months?: Marijuana Previous Attempts/Gestures: No How many times?: 0 Other Self Harm Risks: None Triggers for Past Attempts: None known Intentional Self Injurious Behavior: None Family Suicide History: No Recent  stressful life event(s): Other (Comment)(Homelessness) Persecutory voices/beliefs?: No Depression: Yes Depression Symptoms: Despondent, Feeling angry/irritable, Isolating Substance abuse history and/or treatment for substance abuse?: No Suicide prevention information given to non-admitted patients: Not applicable  Risk to Others within the past 6 months Homicidal Ideation: No Does patient have any lifetime risk of violence toward others beyond the six months prior to admission? : No Thoughts of Harm to Others: No Current Homicidal Intent: No Current Homicidal Plan: No Access to Homicidal Means: No Identified Victim: No one History of harm to others?: No Assessment of Violence: None Noted Violent Behavior Description: None reported Does patient have access to weapons?: No Criminal Charges Pending?: No Does patient have a court date: No Is patient on probation?: No  Psychosis Hallucinations: None noted Delusions: None noted  Mental Status Report Appearance/Hygiene: Disheveled, Body odor, In scrubs Eye Contact: Poor Motor Activity: Freedom of movement, Unremarkable Speech: Logical/coherent Level of Consciousness: Alert Mood: Helpless Affect: Depressed, Irritable Anxiety Level: Minimal Thought Processes: Coherent, Relevant Judgement: Unimpaired Orientation: Person,  Place, Time, Situation Obsessive Compulsive Thoughts/Behaviors: None  Cognitive Functioning Concentration: Normal Memory: Recent Intact, Remote Intact IQ: Average Insight: Poor Impulse Control: Poor Appetite: Fair Weight Loss: 0 Weight Gain: 0 Sleep: No Change Total Hours of Sleep: 8 Vegetative Symptoms: None  ADLScreening [facility] Assessment Services) Patient's cognitive ability adequate to safely complete daily activities?: Yes Patient able to express need for assistance with ADLs?: Yes Independently performs ADLs?: Yes (appropriate for developmental age)  Prior Inpatient Therapy Prior Inpatient  Therapy: Yes Prior Therapy Dates: 06/2017, multiple admits Prior Therapy Facilty/Provider(s): [facility]; [facility] Reason for Treatment: Schizophrenia  Prior Outpatient Therapy Prior Outpatient Therapy: Yes Prior Therapy Dates: CURRENTLY Prior Therapy Facilty/Provider(s): [facility]  Reason for Treatment: MED MGMT Does patient have an ACCT team?: Yes Does patient have Intensive In-House Services?  : No Does patient have [facility] services? : No Does patient have P4CC services?: No  ADL Screening (condition at time of admission) Patient's cognitive ability adequate to safely complete daily activities?: Yes Is the patient deaf or have difficulty hearing?: No Does the patient have difficulty seeing, even when wearing glasses/contacts?: No Does the patient have difficulty concentrating, remembering, or making decisions?: Yes Patient able to express need for assistance with ADLs?: Yes Does the patient have difficulty dressing or bathing?: No Independently performs ADLs?: Yes (appropriate for developmental age) Does the patient have difficulty walking or climbing stairs?: No Weakness of Legs: None Weakness of Arms/Hands: None       Abuse/Neglect Assessment (Assessment to be complete while patient is alone) Physical Abuse: Denies Verbal Abuse: Denies Sexual Abuse: Denies Exploitation of patient/patient's resources: Denies     Merchant navy officer (For Healthcare) Does Patient Have a Medical Advance Directive?: No Would patient like information on creating a medical advance directive?: No - Patient declined    Additional Information 1:1 In Past 12 Months?: No CIRT Risk: No Elopement Risk: No Does patient have medical clearance?: Yes     Disposition:  Disposition Initial Assessment Completed for this Encounter: Yes Disposition of Patient: Discharge with Outpatient Resources Type of inpatient treatment program: Adult Other disposition(s): Other (Comment)(Pt being discharged.)  This  service was provided via telemedicine using a 2-way, interactive audio and video technology.  Names of all persons participating in this telemedicine service and their role in this encounter. Name:  Role:   Name:  Role:   Name:  Role:   Name:  Role:     Alexandria Lodge 07/09/2017 4:15 AM

## 2017-07-10 ENCOUNTER — Encounter (HOSPITAL_COMMUNITY): Payer: Self-pay | Admitting: Emergency Medicine

## 2017-07-10 ENCOUNTER — Telehealth: Payer: Self-pay

## 2017-07-10 ENCOUNTER — Emergency Department (HOSPITAL_COMMUNITY)
Admission: EM | Admit: 2017-07-10 | Discharge: 2017-07-10 | Disposition: A | Discharge status: Home / Self Care | Payer: Medicaid Other | Attending: Emergency Medicine | Admitting: Emergency Medicine

## 2017-07-10 ENCOUNTER — Other Ambulatory Visit: Payer: Self-pay

## 2017-07-10 ENCOUNTER — Emergency Department (HOSPITAL_COMMUNITY)
Admission: EM | Admit: 2017-07-10 | Discharge: 2017-07-11 | Disposition: A | Discharge status: Home / Self Care | Payer: Medicaid Other | Source: Home / Self Care | Attending: Emergency Medicine | Admitting: Emergency Medicine

## 2017-07-10 DIAGNOSIS — F1210 Cannabis abuse, uncomplicated: Secondary | ICD-10-CM | POA: Insufficient documentation

## 2017-07-10 DIAGNOSIS — I10 Essential (primary) hypertension: Secondary | ICD-10-CM | POA: Diagnosis not present

## 2017-07-10 DIAGNOSIS — E119 Type 2 diabetes mellitus without complications: Secondary | ICD-10-CM | POA: Insufficient documentation

## 2017-07-10 DIAGNOSIS — Z760 Encounter for issue of repeat prescription: Secondary | ICD-10-CM | POA: Diagnosis not present

## 2017-07-10 DIAGNOSIS — Z79899 Other long term (current) drug therapy: Secondary | ICD-10-CM | POA: Insufficient documentation

## 2017-07-10 DIAGNOSIS — F17210 Nicotine dependence, cigarettes, uncomplicated: Secondary | ICD-10-CM | POA: Diagnosis not present

## 2017-07-10 DIAGNOSIS — R079 Chest pain, unspecified: Secondary | ICD-10-CM | POA: Diagnosis not present

## 2017-07-10 DIAGNOSIS — R52 Pain, unspecified: Secondary | ICD-10-CM | POA: Diagnosis present

## 2017-07-10 DIAGNOSIS — R1033 Periumbilical pain: Secondary | ICD-10-CM

## 2017-07-10 MED ORDER — ACETAMINOPHEN 500 MG PO TABS
1000.0000 mg | ORAL_TABLET | Freq: Once | ORAL | Status: AC
  Administered 2017-07-10: 1000 mg via ORAL
  Filled 2017-07-10: qty 2

## 2017-07-10 MED ORDER — ACETAMINOPHEN 325 MG PO TABS
650.0000 mg | ORAL_TABLET | Freq: Once | ORAL | Status: AC
  Administered 2017-07-10: 650 mg via ORAL
  Filled 2017-07-10: qty 2

## 2017-07-10 NOTE — Progress Notes (Deleted)
07/10/17 5:25 PM CSW called Congregational Nurse, Claris Gowerharlotte, [facility] CSW discussed plan with nurse. Legal Guardian, Magdalene Riverhavis Gash, informed Claris GowerCharlotte that there is the possibility for a group home placement. Legal guardian works for [employer] and was assigned pt's legal guardian by DSS.   Pt is unsafe on the streets. Pt should not be discharged with any medications. [facility].   Plan: Contact APS every time pt comes to the ED. CSW will continue to collaborate with Congregational nurse, legal guardian, and APS to assist with pt getting placement.   [place] [place] [facility] and [facility] [facility] ([place] assigned pt's legal guardianship to [facility]).   Montine CircleKelsy Ceana Fiala, Silverio LayLCSWA [facility]  [phone]

## 2017-07-10 NOTE — Congregational Nurse Program (Signed)
Congregational Nurse Program Note  Date of Encounter: 07/10/2017  Past Medical History: Past Medical History:  Diagnosis Date  . Anxiety   . Depression   . Diabetes mellitus without complication (HCC)   . Homelessness   . Hypertension   . Schizophrenia [facility](HCC)     Encounter Details: CNP Questionnaire - 07/10/17 1454      Questionnaire   Patient Status  Not Applicable    Race  Black or African American    Location Patient Served At  Not Applicable    Insurance  Medicaid    Uninsured  Not Applicable    Food  Yes, have food insecurities;Within past 12 months, worried food would run out with no money to buy more    Housing/Utilities  No permanent housing    Transportation  Yes, need transportation assistance    Interpersonal Safety  No, do not feel physically and emotionally safe where you currently live    Medication  Yes, have medication insecurities    Medical Provider  Yes    Referrals  Area Agency;Other    ED Visit Averted  Not Applicable    Life-Saving Intervention Made  Not Applicable      [facility]  I saw client in the day room and asked for him to come see me at my office.  He stated he did want to see me today.  He did not want to take his medications.  He became agitated stating he wanted his ADHD medication and his Trazadone so he could sleep.  Reminded client that he needs to be placed before he could be getting his medications.  (It is not safe to give the client the bottles of his medications, he needs supervision and medication management)  TC to Mr. Lorayne MarekGash, the client's guardian.  Discussed with him the need to meet with him and other agencies to develop a plan for this client and expediate placement.  Mr. Lorayne MarekGash informed me that a meeting was not necessary as he has a placement and he is waiting on the final approval.  Mr. Lorayne MarekGash could not tell me where the placement was nor when the approval can be obtained.  I requested that Mr. Lorayne MarekGash contact  me back with that information and let me know ASAP of an admission date and the plan.

## 2017-07-10 NOTE — ED Provider Notes (Addendum)
[name] EMERGENCY DEPARTMENT Provider Note   CSN: [id number] Arrival date & time: 07/10/17  0118     History   Chief Complaint Chief Complaint  Patient presents with  . Generalized Body Aches    HPI John Copeland is a 27 y.o. male.  HPI 27 year old African-American male past medical history significant for anxiety, depression, diabetes, hypertension, schizophrenia and homelessness presents to the emergency department today with complaints of generalized body pains.  Patient complains of nose pain, face pain, bilateral feet pain.  Patient not taking for symptoms because he states he cannot afford anything.  He denies any associated cough, congestion, urinary symptoms, vomiting, diarrhea, headache.  Nothing makes symptoms better or worse.  Patient denies any known sick contacts.Patient also requests that I refill his trazodone and his Strattera For sleep and ADHD.  Patient denies any other complaints at this time. Denies si/hi. Past Medical History:  Diagnosis Date  . Anxiety   . Depression   . Diabetes mellitus without complication (HCC)   . Homelessness   . Hypertension   . Schizophrenia [facility])     Patient Active Problem List   Diagnosis Date Noted  . Left lower lobe pneumonia (HCC) 06/26/2017  . Accidental drug overdose   . Schizophrenia (HCC) 03/29/2017  . 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 Diphenhydramine overdose of undetermined intent 01/10/2017  . Tobacco use disorder 10/07/2016  . Diabetes (HCC) 10/07/2016  . HTN (hypertension) 10/07/2016  . Dyslipidemia 10/07/2016  . Schizoaffective disorder, bipolar type (HCC) 11/30/2015    History reviewed. No pertinent surgical history.     Home Medications    Prior to Admission medications   Medication Sig Start Date End Date Taking? Authorizing Provider  amoxicillin (AMOXIL) 500 MG capsule Take 1 capsule (500 mg total) by mouth 3 (three) times daily. Patient not taking: Reported on 07/04/2017 06/28/17    Palumbo, April, MD  ARIPiprazole (ABILIFY) 15 MG tablet Take 1 tablet (15 mg total) by mouth daily for 14 days. For mood control 07/01/17 07/15/17  Long, Arlyss Repress, MD  atomoxetine (STRATTERA) 18 MG capsule Take 1 capsule (18 mg total) by mouth daily. For ADHD Patient not taking: Reported on 06/26/2017 06/25/17   Armandina Stammer I, NP  benzonatate (TESSALON) 100 MG capsule Take 1 capsule (100 mg total) by mouth 3 (three) times daily as needed for cough. Patient not taking: Reported on 06/26/2017 06/24/17   Petrucelli, Lelon Mast R, PA-C  divalproex (DEPAKOTE) 500 MG DR tablet Take 1 tablet (500 mg total) by mouth every 12 (twelve) hours for 14 days. For mood stabilization 07/01/17 07/15/17  Long, Arlyss Repress, MD  fluticasone (FLONASE) 50 MCG/ACT nasal spray Place 1 spray into both nostrils daily. Patient not taking: Reported on 06/26/2017 06/24/17   Petrucelli, Pleas Koch, PA-C  ibuprofen (ADVIL,MOTRIN) 800 MG tablet Take 1 tablet (800 mg total) by mouth 3 (three) times daily. Patient not taking: Reported on 06/26/2017 06/24/17   Petrucelli, Pleas Koch, PA-C  pseudoephedrine (SUDAFED 12 HOUR) 120 MG 12 hr tablet Take 1 tablet (120 mg total) by mouth 2 (two) times daily. 07/06/17   Melene Plan, DO  traZODone (DESYREL) 50 MG tablet Take 1 tablet (50 mg total) by mouth at bedtime as needed for sleep. Patient not taking: Reported on 06/26/2017 06/24/17   Sanjuana Kava, NP    Family History Family History  Problem Relation Age of Onset  . Diabetes Other     Social History Social History   Tobacco Use  .  Smoking status: Current Every Day Smoker    Packs/day: 0.00    Types: Cigarettes  . Smokeless tobacco: Never Used  Substance Use Topics  . Alcohol use: No  . Drug use: Yes    Types: Marijuana    Comment: denies drug use     Allergies   Haldol [haloperidol lactate]   Review of Systems Review of Systems  Constitutional: Negative for chills and fever.  HENT: Negative for congestion.   Gastrointestinal:  Negative for diarrhea, nausea and vomiting.  Genitourinary: Negative for urgency.  Musculoskeletal: Positive for myalgias.  Skin: Negative for rash.  Neurological: Negative for headaches.     Physical Exam Updated Vital Signs BP (!) 157/86 (BP Location: Right Arm)   Pulse 82   Temp 98.6 F (37 C) (Oral)   Resp 18   Ht 6' (1.829 m)   Wt 83.9 kg (185 lb)   SpO2 100%   BMI 25.09 kg/m    Physical Exam  Constitutional: He appears well-developed and well-nourished. No distress.  HENT:  Head: Normocephalic and atraumatic.  Eyes: Right eye exhibits no discharge. Left eye exhibits no discharge. No scleral icterus.  Neck: Normal range of motion.  Cardiovascular: Normal rate, regular rhythm, normal heart sounds and intact distal pulses. Exam reveals no gallop and no friction rub.  No murmur heard. Pulmonary/Chest: Effort normal and breath sounds normal. No stridor. No respiratory distress. He has no wheezes. He has no rales. He exhibits no tenderness.  Musculoskeletal: Normal range of motion.  Moves all joints without any pain.  No erythema or warmth.  Neurological: He is alert.  Skin: Skin is warm and dry. Capillary refill takes less than 2 seconds. No pallor.  Psychiatric: His behavior is normal. Judgment and thought content normal.  Nursing note and vitals reviewed.    ED Treatments / Results  Labs (all labs ordered are listed, but only abnormal results are displayed) Labs Reviewed - No data to display  EKG  EKG Interpretation None       Radiology No results found.  Procedures Procedures (including critical care time)  Medications Ordered in ED Medications  acetaminophen (TYLENOL) tablet 1,000 mg (not administered)     Initial Impression / Assessment and Plan / ED Course  I have reviewed the triage vital signs and the nursing notes.  Pertinent labs & imaging results that were available during my care of the patient were reviewed by me and considered in my  medical decision making (see chart for details).     She presents to the ED for evaluation of generalized body aches.  Denies any other associated symptoms.  Asked that I refill his medications.  Of note patient has been seen 41 times in the past 6 months in the ED for various complaints including.  Patient is hemodynamically stable.  Physical exam is reassuring.  Given Tylenol for pain and discharged home in no acute distress.  Given him primary care follow-up.  Pt is hemodynamically stable, in NAD, & able to ambulate in the ED. Evaluation does not show pathology that would require ongoing emergent intervention or inpatient treatment. I explained the diagnosis to the patient. Pain has been managed & has no complaints prior to dc. Pt is comfortable with above plan and is stable for discharge at this time. All questions were answered prior to disposition. Strict return precautions for f/u to the ED were discussed. Encouraged follow up with PCP.   Final Clinical Impressions(s) / ED Diagnoses   Final diagnoses:  Generalized body aches    ED Discharge Orders    None      Wallace KellerLeaphart, Kenneth T, PA-C 07/10/17 0618  Rise MuLeaphart, Kenneth T, PA-C 07/10/17 [id number]  Glynn Octaveancour, Stephen, MD 07/10/17 [phone]

## 2017-07-10 NOTE — ED Triage Notes (Signed)
[place] [facility] [place], called out for gen body aches, states he has pain everywhere.

## 2017-07-10 NOTE — ED Notes (Signed)
ED Provider at bedside. 

## 2017-07-10 NOTE — ED Notes (Signed)
Paged chaplain per patient request

## 2017-07-10 NOTE — Telephone Encounter (Signed)
07/10/17 5:25 PM CSW called Congregational Nurse, Charlotte, who has been seeing [facility] pt at the [facility]. CSW discussed plan with nurse. Legal Guardian, [name] Gash, informed Charlotte that there is the possibility for a group home placement. Legal guardian works for [employer] and was assigned pt's legal guardian by DSS.   Pt is unsafe on the streets. Pt should not be discharged with any medications. Pt [facility].   Plan: Contact APS every time pt comes to the ED. CSW will continue to collaborate with Congregational nurse, legal guardian, and APS to assist with pt getting placement.   CSW will call [place] and [place] ([place] assigned pt's legal guardianship to [employer] [facility] [employer] [facility]).   Gabryella Murfin, LCSWA [facility]  [phone]  
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
## 2017-07-10 NOTE — ED Notes (Signed)
APS called at this time 

## 2017-07-10 NOTE — Progress Notes (Addendum)
CSW called APS to report that pt is here.   CSW called ACT Team in [place], [phone]. CSW spoke with [place]. Glenard Haring met with pt at [facility] today. ACT said they inquired about getting a pt a hotel, but no action was taken by legal guardian.  ACT team stated they are continuing to work with pt, but need assistance from legal guardianship in placement.   CSW spoke with on call social worker at [place]. Made report to Angie. Angie informed CSW that she will share new information with APS worker, Arbie Cookey and her supervisor tomorrow. Angie informed CSW that [place] may consider petitioning for legal guardianship from [facility].   Called [facility] [employer] [facility] reported to crisis line that pt is here.   Plan: CSW will follow up with Memory Dance Nurse at [facility], and [facility] worker, Arbie Cookey, tomorrow to discuss care plan for pt.   Wendelyn Breslow, [facility]  [phone]

## 2017-07-10 NOTE — ED Notes (Signed)
Pt visiting with chaplain.

## 2017-07-10 NOTE — ED Notes (Signed)
Called for room X3 with no answer 

## 2017-07-11 ENCOUNTER — Emergency Department (HOSPITAL_COMMUNITY)
Admission: EM | Admit: 2017-07-11 | Discharge: 2017-07-11 | Discharge status: Against Medical Advice | Payer: Medicaid Other

## 2017-07-11 ENCOUNTER — Emergency Department (HOSPITAL_COMMUNITY): Payer: Medicaid Other

## 2017-07-11 ENCOUNTER — Encounter (HOSPITAL_COMMUNITY): Payer: Self-pay

## 2017-07-11 ENCOUNTER — Emergency Department (HOSPITAL_COMMUNITY)
Admission: EM | Admit: 2017-07-11 | Discharge: 2017-07-11 | Disposition: A | Discharge status: Home / Self Care | Payer: Medicaid Other | Attending: Emergency Medicine | Admitting: Emergency Medicine

## 2017-07-11 DIAGNOSIS — R079 Chest pain, unspecified: Secondary | ICD-10-CM | POA: Diagnosis present

## 2017-07-11 DIAGNOSIS — Z5321 Procedure and treatment not carried out due to patient leaving prior to being seen by health care provider: Secondary | ICD-10-CM | POA: Diagnosis not present

## 2017-07-11 LAB — CBC WITH DIFFERENTIAL/PLATELET
BASOS ABS: 0.1 10*3/uL (ref 0.0–0.1)
Basophils Relative: 1 %
EOS ABS: 0.1 10*3/uL (ref 0.0–0.7)
Eosinophils Relative: 1 %
HCT: 37.7 % — ABNORMAL LOW (ref 39.0–52.0)
HEMOGLOBIN: 12.2 g/dL — AB (ref 13.0–17.0)
Lymphocytes Relative: 34 %
Lymphs Abs: 2.8 10*3/uL (ref 0.7–4.0)
MCH: 23.5 pg — ABNORMAL LOW (ref 26.0–34.0)
MCHC: 32.4 g/dL (ref 30.0–36.0)
MCV: 72.5 fL — ABNORMAL LOW (ref 78.0–100.0)
Monocytes Absolute: 0.4 10*3/uL (ref 0.1–1.0)
Monocytes Relative: 5 %
NEUTROS PCT: 59 %
Neutro Abs: 4.8 10*3/uL (ref 1.7–7.7)
Platelets: 395 10*3/uL (ref 150–400)
RBC: 5.2 MIL/uL (ref 4.22–5.81)
RDW: 14 % (ref 11.5–15.5)
WBC: 8.2 10*3/uL (ref 4.0–10.5)

## 2017-07-11 LAB — COMPREHENSIVE METABOLIC PANEL
ALK PHOS: 50 U/L (ref 38–126)
ALT: 22 U/L (ref 17–63)
ANION GAP: 10 (ref 5–15)
AST: 31 U/L (ref 15–41)
Albumin: 3.9 g/dL (ref 3.5–5.0)
BILIRUBIN TOTAL: 0.4 mg/dL (ref 0.3–1.2)
BUN: 11 mg/dL (ref 6–20)
CALCIUM: 9.3 mg/dL (ref 8.9–10.3)
CO2: 25 mmol/L (ref 22–32)
Chloride: 102 mmol/L (ref 101–111)
Creatinine, Ser: 0.84 mg/dL (ref 0.61–1.24)
GFR calc non Af Amer: >60 mL/min (ref >60)
Glucose, Bld: 69 mg/dL (ref 65–99)
Potassium: 4.1 mmol/L (ref 3.5–5.1)
Sodium: 137 mmol/L (ref 135–145)
TOTAL PROTEIN: 6.9 g/dL (ref 6.5–8.1)

## 2017-07-11 LAB — ACETAMINOPHEN LEVEL: Acetaminophen (Tylenol), Serum: <10 ug/mL — ABNORMAL LOW (ref 10–30)

## 2017-07-11 LAB — BASIC METABOLIC PANEL
ANION GAP: 10 (ref 5–15)
BUN: 5 mg/dL — ABNORMAL LOW (ref 6–20)
CALCIUM: 9.4 mg/dL (ref 8.9–10.3)
CO2: 26 mmol/L (ref 22–32)
CREATININE: 0.85 mg/dL (ref 0.61–1.24)
Chloride: 103 mmol/L (ref 101–111)
Glucose, Bld: 91 mg/dL (ref 65–99)
Potassium: 3.9 mmol/L (ref 3.5–5.1)
Sodium: 139 mmol/L (ref 135–145)

## 2017-07-11 LAB — SALICYLATE LEVEL

## 2017-07-11 LAB — CBC
HCT: 38.2 % — ABNORMAL LOW (ref 39.0–52.0)
HEMOGLOBIN: 12.3 g/dL — AB (ref 13.0–17.0)
MCH: 23.4 pg — ABNORMAL LOW (ref 26.0–34.0)
MCHC: 32.2 g/dL (ref 30.0–36.0)
MCV: 72.6 fL — ABNORMAL LOW (ref 78.0–100.0)
PLATELETS: 373 10*3/uL (ref 150–400)
RBC: 5.26 MIL/uL (ref 4.22–5.81)
RDW: 14.3 % (ref 11.5–15.5)
WBC: 6.2 10*3/uL (ref 4.0–10.5)

## 2017-07-11 LAB — I-STAT TROPONIN, ED: TROPONIN I, POC: 0.01 ng/mL (ref 0.00–0.08)

## 2017-07-11 NOTE — ED Triage Notes (Signed)
[facility]. Patient arrived with IV NSL that was d/c'd on arrival. Received asa and 1 sl NTG with no relief with pain. States that he is tired and wants to nap. NAD
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
## 2017-07-11 NOTE — ED Notes (Signed)
Pt departed in NAD, refused use of wheelchair. Given snacks, milk, extra blanket, and bus pass.

## 2017-07-12 ENCOUNTER — Encounter (HOSPITAL_COMMUNITY): Payer: Self-pay

## 2017-07-12 ENCOUNTER — Encounter (HOSPITAL_COMMUNITY): Payer: Self-pay | Admitting: Emergency Medicine

## 2017-07-12 ENCOUNTER — Emergency Department (HOSPITAL_COMMUNITY)
Admission: EM | Admit: 2017-07-12 | Discharge: 2017-07-12 | Disposition: A | Discharge status: Home / Self Care | Payer: Medicaid Other | Attending: Emergency Medicine | Admitting: Emergency Medicine

## 2017-07-12 ENCOUNTER — Emergency Department (HOSPITAL_COMMUNITY)
Admission: EM | Admit: 2017-07-12 | Discharge: 2017-07-12 | Disposition: A | Discharge status: Home / Self Care | Payer: Medicaid Other | Source: Home / Self Care

## 2017-07-12 DIAGNOSIS — R109 Unspecified abdominal pain: Secondary | ICD-10-CM | POA: Diagnosis present

## 2017-07-12 DIAGNOSIS — Z5321 Procedure and treatment not carried out due to patient leaving prior to being seen by health care provider: Secondary | ICD-10-CM | POA: Insufficient documentation

## 2017-07-12 DIAGNOSIS — R531 Weakness: Secondary | ICD-10-CM | POA: Insufficient documentation

## 2017-07-12 LAB — URINALYSIS, ROUTINE W REFLEX MICROSCOPIC
Bilirubin Urine: NEGATIVE
Glucose, UA: NEGATIVE mg/dL
Hgb urine dipstick: NEGATIVE
Ketones, ur: NEGATIVE mg/dL
LEUKOCYTES UA: NEGATIVE
NITRITE: NEGATIVE
PH: 8 (ref 5.0–8.0)
Protein, ur: NEGATIVE mg/dL
SPECIFIC GRAVITY, URINE: 1.002 — AB (ref 1.005–1.030)

## 2017-07-12 NOTE — ED Notes (Signed)
Patient left without being seen.

## 2017-07-12 NOTE — ED Notes (Signed)
Patient asked if he could get a prescription for  his ADHD medication.

## 2017-07-12 NOTE — Discharge Planning (Signed)
Call from Lattie Hawharlotte Evans, RN (congregtional nurses) [phone], who updated some phone numbers for this pt. [name] (leagal guardian) [phone] Arnetha CourserCarol Wright (APS worker) [phone]  Mr Lorayne MarekGash expresses desire to retrieve pt and return him to his residence.

## 2017-07-12 NOTE — ED Triage Notes (Addendum)
Per Pt, Pt is coming from [employer] with complaints of generalized weakness. PT reports bilateral leg weakness and unable to walk, noted to urinate on himself. Vitals per EMS: CBG 94, 150/90, 89 HR, 97% on RA. Pt is alert and oriented x4.   [facility].   Pt is walking around the room and up in the hallway.

## 2017-07-12 NOTE — ED Notes (Signed)
Patient states he is very hungry.

## 2017-07-12 NOTE — ED Notes (Signed)
Called pt x2 to update vitals, no response. 

## 2017-07-12 NOTE — ED Triage Notes (Addendum)
Pt from home via [facility] with complaints of abdominal pain. Pt has been evaluated for this. [facility] 2 days in a row and [facility] the second time. Pt had labs drawn at previous visit. Pt was given tylenol and denies relief. Pt has an arm full of multiple Bibles and refuses to set them down. Pt is hypertensive but he will not straighten his arm. Pt also has hx of htn and schizophrenia.     130/90 81 HR, 18 RR, 97% oxygen saturation

## 2017-07-12 NOTE — ED Notes (Signed)
Patient has hx schizophrenia

## 2017-07-12 NOTE — ED Notes (Signed)
Pt refused blood work  

## 2017-07-15 ENCOUNTER — Emergency Department (HOSPITAL_COMMUNITY)
Admission: EM | Admit: 2017-07-15 | Discharge: 2017-07-15 | Disposition: A | Discharge status: Home / Self Care | Payer: Medicaid Other | Attending: Emergency Medicine | Admitting: Emergency Medicine

## 2017-07-15 ENCOUNTER — Telehealth: Payer: Self-pay

## 2017-07-15 ENCOUNTER — Emergency Department (HOSPITAL_COMMUNITY): Payer: Medicaid Other

## 2017-07-15 ENCOUNTER — Encounter (HOSPITAL_COMMUNITY): Payer: Self-pay | Admitting: Emergency Medicine

## 2017-07-15 ENCOUNTER — Other Ambulatory Visit: Payer: Self-pay

## 2017-07-15 DIAGNOSIS — K529 Noninfective gastroenteritis and colitis, unspecified: Secondary | ICD-10-CM | POA: Diagnosis not present

## 2017-07-15 DIAGNOSIS — F209 Schizophrenia, unspecified: Secondary | ICD-10-CM | POA: Insufficient documentation

## 2017-07-15 DIAGNOSIS — E119 Type 2 diabetes mellitus without complications: Secondary | ICD-10-CM | POA: Diagnosis not present

## 2017-07-15 DIAGNOSIS — Z590 Homelessness: Secondary | ICD-10-CM | POA: Diagnosis not present

## 2017-07-15 DIAGNOSIS — R109 Unspecified abdominal pain: Secondary | ICD-10-CM | POA: Insufficient documentation

## 2017-07-15 DIAGNOSIS — F17210 Nicotine dependence, cigarettes, uncomplicated: Secondary | ICD-10-CM | POA: Diagnosis not present

## 2017-07-15 DIAGNOSIS — I10 Essential (primary) hypertension: Secondary | ICD-10-CM | POA: Diagnosis not present

## 2017-07-15 DIAGNOSIS — R45851 Suicidal ideations: Secondary | ICD-10-CM | POA: Diagnosis not present

## 2017-07-15 DIAGNOSIS — Z79899 Other long term (current) drug therapy: Secondary | ICD-10-CM | POA: Insufficient documentation

## 2017-07-15 DIAGNOSIS — K2900 Acute gastritis without bleeding: Secondary | ICD-10-CM

## 2017-07-15 LAB — COMPREHENSIVE METABOLIC PANEL
ALK PHOS: 43 U/L (ref 38–126)
ALT: 22 U/L (ref 17–63)
AST: 23 U/L (ref 15–41)
Albumin: 3.4 g/dL — ABNORMAL LOW (ref 3.5–5.0)
Anion gap: 9 (ref 5–15)
BILIRUBIN TOTAL: 0.3 mg/dL (ref 0.3–1.2)
BUN: 11 mg/dL (ref 6–20)
CALCIUM: 9.1 mg/dL (ref 8.9–10.3)
CHLORIDE: 108 mmol/L (ref 101–111)
CO2: 25 mmol/L (ref 22–32)
CREATININE: 0.9 mg/dL (ref 0.61–1.24)
GFR calc Af Amer: >60 mL/min (ref >60)
Glucose, Bld: 128 mg/dL — ABNORMAL HIGH (ref 65–99)
Potassium: 4 mmol/L (ref 3.5–5.1)
Sodium: 142 mmol/L (ref 135–145)
Total Protein: 6.3 g/dL — ABNORMAL LOW (ref 6.5–8.1)

## 2017-07-15 LAB — RAPID URINE DRUG SCREEN, HOSP PERFORMED
AMPHETAMINES: NOT DETECTED
Barbiturates: NOT DETECTED
Benzodiazepines: NOT DETECTED
Cocaine: NOT DETECTED
Opiates: NOT DETECTED
TETRAHYDROCANNABINOL: NOT DETECTED

## 2017-07-15 LAB — CBC
HCT: 36.9 % — ABNORMAL LOW (ref 39.0–52.0)
Hemoglobin: 11.5 g/dL — ABNORMAL LOW (ref 13.0–17.0)
MCH: 23.1 pg — ABNORMAL LOW (ref 26.0–34.0)
MCHC: 31.2 g/dL (ref 30.0–36.0)
MCV: 74.1 fL — AB (ref 78.0–100.0)
PLATELETS: 419 10*3/uL — AB (ref 150–400)
RBC: 4.98 MIL/uL (ref 4.22–5.81)
RDW: 14.9 % (ref 11.5–15.5)
WBC: 6.3 10*3/uL (ref 4.0–10.5)

## 2017-07-15 LAB — URINALYSIS, ROUTINE W REFLEX MICROSCOPIC
Bilirubin Urine: NEGATIVE
GLUCOSE, UA: NEGATIVE mg/dL
HGB URINE DIPSTICK: NEGATIVE
KETONES UR: 5 mg/dL — AB
LEUKOCYTES UA: NEGATIVE
Nitrite: NEGATIVE
Protein, ur: NEGATIVE mg/dL
Specific Gravity, Urine: 1.025 (ref 1.005–1.030)
pH: 6 (ref 5.0–8.0)

## 2017-07-15 LAB — LIPASE, BLOOD: Lipase: 38 U/L (ref 11–51)

## 2017-07-15 LAB — ACETAMINOPHEN LEVEL: Acetaminophen (Tylenol), Serum: <10 ug/mL — ABNORMAL LOW (ref 10–30)

## 2017-07-15 LAB — SALICYLATE LEVEL: Salicylate Lvl: <7 mg/dL (ref 2.8–30.0)

## 2017-07-15 LAB — ETHANOL: Alcohol, Ethyl (B): <10 mg/dL (ref <10)

## 2017-07-15 MED ORDER — IOPAMIDOL (ISOVUE-300) INJECTION 61%
INTRAVENOUS | Status: AC
  Filled 2017-07-15: qty 100

## 2017-07-15 MED ORDER — SODIUM CHLORIDE 0.9 % IJ SOLN
INTRAMUSCULAR | Status: AC
  Filled 2017-07-15: qty 50

## 2017-07-15 MED ORDER — OLANZAPINE 10 MG PO TBDP: 10.0000 mg | ORAL_TABLET | Freq: Three times a day (TID) | ORAL | Status: DC | PRN

## 2017-07-15 MED ORDER — PANTOPRAZOLE SODIUM 40 MG PO TBEC: 40.0000 mg | DELAYED_RELEASE_TABLET | Freq: Every day | ORAL | Status: DC

## 2017-07-15 MED ORDER — ALUM & MAG HYDROXIDE-SIMETH 200-200-20 MG/5ML PO SUSP: 30.0000 mL | Freq: Four times a day (QID) | ORAL | Status: DC | PRN

## 2017-07-15 MED ORDER — PANTOPRAZOLE SODIUM 20 MG PO TBEC: 20.0000 mg | DELAYED_RELEASE_TABLET | Freq: Every day | ORAL | 0 refills | Status: DC

## 2017-07-15 MED ORDER — IOPAMIDOL (ISOVUE-300) INJECTION 61%
100.0000 mL | Freq: Once | INTRAVENOUS | Status: AC | PRN
  Administered 2017-07-15: 100 mL via INTRAVENOUS

## 2017-07-15 MED ORDER — ZIPRASIDONE MESYLATE 20 MG IM SOLR: 20.0000 mg | INTRAMUSCULAR | Status: DC | PRN

## 2017-07-15 MED ORDER — LORAZEPAM 1 MG PO TABS: 1.0000 mg | ORAL_TABLET | ORAL | Status: DC | PRN
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
## 2017-07-15 NOTE — Telephone Encounter (Signed)
TC from Guardian, Mr. Lorayne MarekGash.  He reported he was coming to get Josh and "take him to another hospital" to have him admitted because "[facility] refuses to admit him even though he is psychotic".  Guardian reports client has been accepted at the [facility] in [place].  Provided guardian with the case manager - Phoebe SharpsWandyn Rogers name and contact information.

## 2017-07-15 NOTE — Progress Notes (Signed)
Per Nira ConnJason Berry, NP pt is recommended for d/c to his current ACTT team. TTS will attempt to contact ACTT team to pick up the pt. EDP Pollina, Canary Brimhristopher J, MD and pt's nurse Marlene BastQuita, RN have been advised.  Princess BruinsAquicha Masen Salvas, MSW, LCSW Therapeutic Triage Specialist  [phone]

## 2017-07-15 NOTE — ED Notes (Addendum)
Pt given peanut butter, orange juice, graham crackers, milk, and a cheese stick.

## 2017-07-15 NOTE — ED Provider Notes (Addendum)
[facility]-EMERGENCY DEPT Provider Note   CSN: [id number] Arrival date & time: 07/15/17  0030     History   Chief Complaint Chief Complaint  Patient presents with  . Suicidal  . Abdominal Pain    HPI John Copeland is a 27 y.o. male.  Patient presents to the emergency department for evaluation of abdominal pain andSuicidal ideation.  Patient reports that he has been having persistent upper abdominal discomfort for several days.  He has not identified anything that makes the pain better or worse.  He has not had nausea, vomiting, diarrhea or constipation.  Patient also reports that he is suicidal.  He has been thinking of hanging himself.      Past Medical History:  Diagnosis Date  . Anxiety   . Depression   . Diabetes mellitus without complication (HCC)   . Homelessness   . Hypertension   . Schizophrenia [facility](HCC)     Patient Active Problem List   Diagnosis Date Noted  . Left lower lobe pneumonia (HCC) 06/26/2017  . Accidental drug overdose   . Schizophrenia (HCC) 03/29/2017  . 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 Diphenhydramine overdose of undetermined intent 01/10/2017  . Tobacco use disorder 10/07/2016  . Diabetes (HCC) 10/07/2016  . HTN (hypertension) 10/07/2016  . Dyslipidemia 10/07/2016  . Schizoaffective disorder, bipolar type (HCC) 11/30/2015    History reviewed. No pertinent surgical history.     Home Medications    Prior to Admission medications   Medication Sig Start Date End Date Taking? Authorizing Provider  ARIPiprazole (ABILIFY) 15 MG tablet Take 1 tablet (15 mg total) by mouth daily for 14 days. For mood control 07/01/17 07/15/17 Yes Long, Arlyss RepressJoshua G, MD  divalproex (DEPAKOTE) 500 MG DR tablet Take 1 tablet (500 mg total) by mouth every 12 (twelve) hours for 14 days. For mood stabilization 07/01/17 07/15/17 Yes Long, Arlyss RepressJoshua G, MD  amoxicillin (AMOXIL) 500 MG capsule Take 1 capsule (500 mg total) by mouth 3 (three) times daily. Patient  not taking: Reported on 07/04/2017 06/28/17   Palumbo, April, MD  atomoxetine (STRATTERA) 18 MG capsule Take 1 capsule (18 mg total) by mouth daily. For ADHD Patient not taking: Reported on 06/26/2017 06/25/17   Armandina StammerNwoko, Agnes I, NP  benzonatate (TESSALON) 100 MG capsule Take 1 capsule (100 mg total) by mouth 3 (three) times daily as needed for cough. Patient not taking: Reported on 06/26/2017 06/24/17   Petrucelli, Lelon MastSamantha R, PA-C  fluticasone (FLONASE) 50 MCG/ACT nasal spray Place 1 spray into both nostrils daily. Patient not taking: Reported on 06/26/2017 06/24/17   Petrucelli, Pleas KochSamantha R, PA-C  ibuprofen (ADVIL,MOTRIN) 800 MG tablet Take 1 tablet (800 mg total) by mouth 3 (three) times daily. Patient not taking: Reported on 06/26/2017 06/24/17   Petrucelli, Pleas KochSamantha R, PA-C  pseudoephedrine (SUDAFED 12 HOUR) 120 MG 12 hr tablet Take 1 tablet (120 mg total) by mouth 2 (two) times daily. Patient not taking: Reported on 07/12/2017 07/06/17   Melene PlanFloyd, Dan, DO  traZODone (DESYREL) 50 MG tablet Take 1 tablet (50 mg total) by mouth at bedtime as needed for sleep. Patient not taking: Reported on 06/26/2017 06/24/17   Sanjuana KavaNwoko, Agnes I, NP    Family History Family History  Problem Relation Age of Onset  . Diabetes Other     Social History Social History   Tobacco Use  . Smoking status: Current Every Day Smoker    Packs/day: 0.00    Types: Cigarettes  . Smokeless tobacco: Never Used  Substance  Use Topics  . Alcohol use: No  . Drug use: Yes    Types: Marijuana    Comment: denies drug use     Allergies   Haldol [haloperidol lactate]   Review of Systems Review of Systems  Gastrointestinal: Positive for abdominal pain.  Psychiatric/Behavioral: Positive for suicidal ideas.  All other systems reviewed and are negative.    Physical Exam Updated Vital Signs Ht 6' (1.829 m)   Wt 83.9 kg (185 lb)   BMI 25.09 kg/m   Physical Exam  Constitutional: He is oriented to person, place, and time. He  appears well-developed and well-nourished. No distress.  HENT:  Head: Normocephalic and atraumatic.  Right Ear: Hearing normal.  Left Ear: Hearing normal.  Nose: Nose normal.  Mouth/Throat: Oropharynx is clear and moist and mucous membranes are normal.  Eyes: Conjunctivae and EOM are normal. Pupils are equal, round, and reactive to light.  Neck: Normal range of motion. Neck supple.  Cardiovascular: Regular rhythm, S1 normal and S2 normal. Exam reveals no gallop and no friction rub.  No murmur heard. Pulmonary/Chest: Effort normal and breath sounds normal. No respiratory distress. He exhibits no tenderness.  Abdominal: Soft. Normal appearance and bowel sounds are normal. There is no hepatosplenomegaly. There is tenderness in the epigastric area. There is no rebound, no guarding, no tenderness at McBurney's point and negative Murphy's sign. No hernia.  Musculoskeletal: Normal range of motion.  Neurological: He is alert and oriented to person, place, and time. He has normal strength. No cranial nerve deficit or sensory deficit. Coordination normal. GCS eye subscore is 4. GCS verbal subscore is 5. GCS motor subscore is 6.  Skin: Skin is warm, dry and intact. No rash noted. No cyanosis.  Psychiatric: His speech is delayed. He is slowed and withdrawn. He exhibits a depressed mood. He expresses suicidal ideation.  Nursing note and vitals reviewed.    ED Treatments / Results  Labs (all labs ordered are listed, but only abnormal results are displayed) Labs Reviewed  COMPREHENSIVE METABOLIC PANEL - Abnormal; Notable for the following components:      Result Value   Glucose, Bld 128 (*)    Total Protein 6.3 (*)    Albumin 3.4 (*)    All other components within normal limits  CBC - Abnormal; Notable for the following components:   Hemoglobin 11.5 (*)    HCT 36.9 (*)    MCV 74.1 (*)    MCH 23.1 (*)    Platelets 419 (*)    All other components within normal limits  URINALYSIS, ROUTINE W  REFLEX MICROSCOPIC - Abnormal; Notable for the following components:   APPearance HAZY (*)    Ketones, ur 5 (*)    All other components within normal limits  ACETAMINOPHEN LEVEL - Abnormal; Notable for the following components:   Acetaminophen (Tylenol), Serum <10 (*)    All other components within normal limits  LIPASE, BLOOD  ETHANOL  SALICYLATE LEVEL  RAPID URINE DRUG SCREEN, HOSP PERFORMED    EKG  EKG Interpretation None       Radiology Ct Abdomen Pelvis W Contrast  Result Date: 07/15/2017 CLINICAL DATA:  Acute upper abdominal pain. EXAM: CT ABDOMEN AND PELVIS WITH CONTRAST TECHNIQUE: Multidetector CT imaging of the abdomen and pelvis was performed using the standard protocol following bolus administration of intravenous contrast. CONTRAST:  ISOVUE-300 IOPAMIDOL (ISOVUE-300) INJECTION 61% COMPARISON:  None. FINDINGS: Lower chest: Lung bases are clear. No consolidation or pleural fluid. Hepatobiliary: No focal liver abnormality  is seen. Gallbladder is contracted no gallstones, gallbladder wall thickening, or biliary dilatation. Pancreas: No ductal dilatation or inflammation. Spleen: Normal in size without focal abnormality. Adrenals/Urinary Tract: Adrenal glands are unremarkable. Kidneys are normal, without renal calculi, focal lesion, or hydronephrosis. Bladder is unremarkable. Stomach/Bowel: Lack of enteric contrast and paucity of intra-abdominal fat limits assessment. Distal gastric wall thickening versus nondistention. No small bowel obstruction. There is fecalization of distal small bowel contents involving pelvic bowel loops. No definite small or large bowel wall thickening allowing for limitations. The appendix is not definitively visualized, there is no right lower quadrant or pericecal inflammation. Moderate stool burden. Vascular/Lymphatic: No significant vascular findings are present. Incidental note of 2 left renal arteries. No enlarged abdominal or pelvic lymph nodes.  Reproductive: Prostate is unremarkable. Other: Small amount of simple free fluid in the pelvis is nonspecific. No evidence of intra-abdominal abscess. No free air. Musculoskeletal: There are no acute or suspicious osseous abnormalities. IMPRESSION: 1. Possible distal gastric wall thickening/gastritis versus peristalsis. 2. Small amount of simple free fluid in the pelvis is nonspecific, but likely reactive. Electronically Signed   By: Rubye Oaks M.D.   On: 07/15/2017 04:10    Procedures Procedures (including critical care time)  Medications Ordered in ED Medications  iopamidol (ISOVUE-300) 61 % injection (not administered)  sodium chloride 0.9 % injection (not administered)  iopamidol (ISOVUE-300) 61 % injection 100 mL (100 mLs Intravenous Contrast Given 07/15/17 0339)     Initial Impression / Assessment and Plan / ED Course  I have reviewed the triage vital signs and the nursing notes.  Pertinent labs & imaging results that were available during my care of the patient were reviewed by me and considered in my medical decision making (see chart for details).     Patient brought to the emergency department for evaluation of 2 separate problems.  Patient complaining of abdominal pain.  This is his second visit for this.  He is having predominantly epigastric discomfort.  He has mild tenderness but no focality.  No right upper quadrant tenderness, no tenderness in the lower abdomen including at McBurney's point.  Patient does not have any signs of acute surgical process.  Because this is a second visit, CT scan was performed.  He does have thickening of the stomach consistent with gastritis which clinically fits.  Will treat with Protonix.  Patient also complaining of suicidal ideation.  At this point he is now medically cleared to be evaluated by psychiatry.  Addendum: Patient has been evaluated by psychiatry and it was determined that he does not require inpatient treatment.  He has an ACT  team that cares for him, he will be discharged into their care this morning.  Final Clinical Impressions(s) / ED Diagnoses   Final diagnoses:  Suicidal ideation  Acute gastritis without hemorrhage, unspecified gastritis type    ED Discharge Orders    None       Cassady Turano, Canary Brim, MD 07/15/17 0425    Gilda Crease, MD 07/15/17 [phone]

## 2017-07-15 NOTE — BH Assessment (Addendum)
Assessment Note  John Copeland is an 27 y.o. male who presents to the ED voluntarily. Pt states he is suicidal with a plan to hang himself. Pt has 42 ED visits in the past 6 months c/o similar concerns. Pt is well known to this ED and has a current ACTT team with [facility]. Pt does not engage with this Clinical research associatewriter and has a blanket covering his head. Pt states he does not want to take the blanket off of his head. Pt is inaudible during the assessment. Pt was asked why he is feeling suicidal and pt does not respond. Pt states he resides at [facility]. Pt states he wants to hang himself. Pt denies HI. Pt was asked if he is experiencing AVH and pt does not respond. Upon review of the chart, pt has a hx of AVH. Pt has been assessed by TTS on multiple occasions including: 07/09/17, 07/07/17, 07/05/17, 06/28/17, and several other assessments.   Per Nira ConnJason Berry, NP pt is recommended for d/c to his current ACTT team. TTS will attempt to contact ACTT team to pick up the pt. EDP Pollina, Canary Brimhristopher J, MD and pt's nurse Marlene BastQuita, RN have been advised.  Diagnosis: Schizophrenia   Past Medical History:  Past Medical History:  Diagnosis Date  . Anxiety   . Depression   . Diabetes mellitus without complication (HCC)   . Homelessness   . Hypertension   . Schizophrenia (HCC)     History reviewed. No pertinent surgical history.  Family History:  Family History  Problem Relation Age of Onset  . Diabetes Other     Social History:  reports that he has been smoking cigarettes.  He has been smoking about 0.00 packs per day. he has never used smokeless tobacco. He reports that he uses drugs. Drug: Marijuana. He reports that he does not drink alcohol.  Additional Social History:  Alcohol / Drug Use Pain Medications: See MAR Prescriptions: See MAR Over the Counter: See MAR History of alcohol / drug use?: Yes(per chart review) Substance #1 Name of Substance 1: Marijuana(per chart review )  CIWA:   COWS:     Allergies:  Allergies  Allergen Reactions  . Haldol [Haloperidol Lactate] Other (See Comments)    Makes him feel slow    Home Medications:  (Not in a hospital admission)  OB/GYN Status:  No LMP for male patient.  General Assessment Data Location of Assessment: [facility] TTS Assessment: In system Is this a Tele or Face-to-Face Assessment?: Face-to-Face Is this an Initial Assessment or a Re-assessment for this encounter?: Initial Assessment Marital status: Single Is patient pregnant?: No Pregnancy Status: No Living Arrangements: Other (Comment)([facility] ) Can pt return to current living arrangement?: Yes Admission Status: Voluntary Is patient capable of signing voluntary admission?: Yes Referral Source: Self/Family/Friend Insurance type: Medicaid     Crisis Care Plan Living Arrangements: Other (Comment)([facility] ) Legal Guardian: Other:([facility] at [phone]) Name of Psychiatrist: [facility](per chart review ) Name of Therapist: [facility] [place](per chart review )  Education Status Is patient currently in school?: No Highest grade of school patient has completed: 8th grade(per chart review )  Risk to self with the past 6 months Suicidal Ideation: Yes-Currently Present Has patient been a risk to self within the past 6 months prior to admission? : No Suicidal Intent: No Has patient had any suicidal intent within the past 6 months prior to admission? : No Is patient at risk for suicide?: Yes Suicidal Plan?:  Yes-Currently Present Has patient had any suicidal plan within the past 6 months prior to admission? : Yes Specify Current Suicidal Plan: pt states "I don't know, hang myself."  Access to Means: (UNKNOWN) What has been your use of drugs/alcohol within the last 12 months?: Per chart, pt has hx of cannabis use, labs negative on arrival  Previous Attempts/Gestures: Yes How many times?: (multiple) Triggers for Past Attempts:  Unknown Intentional Self Injurious Behavior: None Family Suicide History: No Recent stressful life event(s): Other (Comment)(pt does not disclose recent stressors ) Persecutory voices/beliefs?: No Depression: Yes Depression Symptoms: Feeling worthless/self pity, Loss of interest in usual pleasures Substance abuse history and/or treatment for substance abuse?: No Suicide prevention information given to non-admitted patients: Yes  Risk to Others within the past 6 months Homicidal Ideation: No Does patient have any lifetime risk of violence toward others beyond the six months prior to admission? : No Thoughts of Harm to Others: No Current Homicidal Intent: No Current Homicidal Plan: No Access to Homicidal Means: No History of harm to others?: No Assessment of Violence: None Noted Does patient have access to weapons?: No Criminal Charges Pending?: No Does patient have a court date: No Is patient on probation?: No  Psychosis Hallucinations: Auditory, Visual, With command Delusions: Unspecified  Mental Status Report Appearance/Hygiene: Disheveled Eye Contact: Poor Motor Activity: Freedom of movement Speech: Incoherent Level of Consciousness: Drowsy Mood: Helpless Affect: Constricted Anxiety Level: None Thought Processes: Thought Blocking Judgement: Impaired Orientation: Person Obsessive Compulsive Thoughts/Behaviors: None  Cognitive Functioning Concentration: Poor Memory: Remote Impaired, Recent Impaired IQ: Average Insight: Poor Impulse Control: Poor Appetite: Good Sleep: Unable to Assess Vegetative Symptoms: Unable to Assess  ADLScreening [facility] Assessment Services) Patient's cognitive ability adequate to safely complete daily activities?: Yes Patient able to express need for assistance with ADLs?: Yes Independently performs ADLs?: Yes (appropriate for developmental age)  Prior Inpatient Therapy Prior Inpatient Therapy: Yes Prior Therapy Dates: 06/2017, multiple  admits Prior Therapy Facilty/Provider(s): [facility]; [facility] Reason for Treatment: Schizophrenia  Prior Outpatient Therapy Prior Outpatient Therapy: Yes Prior Therapy Dates: CURRENTLY Prior Therapy Facilty/Provider(s): [facility]  Reason for Treatment: MED MGMT Does patient have an ACCT team?: Yes Does patient have Intensive In-House Services?  : No Does patient have [facility] services? : No Does patient have P4CC services?: No  ADL Screening (condition at time of admission) Patient's cognitive ability adequate to safely complete daily activities?: Yes Is the patient deaf or have difficulty hearing?: No Does the patient have difficulty seeing, even when wearing glasses/contacts?: No Does the patient have difficulty concentrating, remembering, or making decisions?: Yes Patient able to express need for assistance with ADLs?: Yes Does the patient have difficulty dressing or bathing?: No Independently performs ADLs?: Yes (appropriate for developmental age) Does the patient have difficulty walking or climbing stairs?: No Weakness of Legs: None Weakness of Arms/Hands: None  Home Assistive Devices/Equipment Home Assistive Devices/Equipment: None    Abuse/Neglect Assessment (Assessment to be complete while patient is alone) Abuse/Neglect Assessment Can Be Completed: Yes(per chart review) Physical Abuse: Denies Verbal Abuse: Denies Sexual Abuse: Denies Exploitation of patient/patient's resources: Denies Self-Neglect: Denies     Merchant navy officer (For Healthcare) Does Patient Have a Medical Advance Directive?: No Would patient like information on creating a medical advance directive?: No - Patient declined    Additional Information 1:1 In Past 12 Months?: No CIRT Risk: No Elopement Risk: No Does patient have medical clearance?: Yes     Disposition: Per Nira Conn, NP pt is recommended for  d/c to his current ACTT team. TTS will attempt to contact ACTT team to pick up the pt. EDP  Pollina, Canary Brim, MD and pt's nurse Marlene Bast, RN have been advised.  Disposition Initial Assessment Completed for this Encounter: Yes Disposition of Patient: Discharge with Outpatient Resources(f/u with current ACTT team per Nira Conn, NP)  On Site Evaluation by:   Reviewed with Physician:    Karolee Ohs 07/15/2017 5:18 AM

## 2017-07-15 NOTE — Progress Notes (Signed)
CSW spoke with patients legal guardian, [name] [phone] w/ [facility], regarding discharge plans. CSW informed legal guardian patient has been medically/ psych cleared and is ready for discharge. Legal guardian stated he was enroute to pick patient up and [facility].   CSW requested legal guardian to fax guardianship documentation to [phone]  Stacy GardnerErin Ayari Liwanag, [facility] Emergency Room Clinical Social Worker [phone]

## 2017-07-15 NOTE — Progress Notes (Signed)
CSW received request by ED CN to call [facility] because they needed documentation.  CSW called and spoke to manager who asked why pt was only sent with on prescription. CSW informed manager EDP's are only allowed to provide prescriptions for medication prescribed by the EDP.  Manager stated he understood and thanked the CSW.  Please reconsult if future social work needs arise.  CSW signing off, as social work intervention is no longer needed.  Dorothe PeaJonathan F. Videl Nobrega, LCSW, LCAS, CSI Clinical Social Worker Ph: [phone]

## 2017-07-15 NOTE — ED Notes (Signed)
Bed: [facility] Expected date:  Expected time:  Means of arrival:  Comments: 

## 2017-07-15 NOTE — ED Notes (Signed)
Bed: WLPT3 Expected date:  Expected time:  Means of arrival:  Comments: 

## 2017-07-15 NOTE — ED Notes (Signed)
Pt has two bags (one white pt belongings' bag and one black suitcase) under the desk by the nurse for 19-22.

## 2017-07-15 NOTE — ED Triage Notes (Signed)
Patient is complaining upper abdominal pain for two days. Patient is wanting to kill himself also.

## 2017-07-15 NOTE — Progress Notes (Signed)
Per chart, pt's legal guardian is John Copeland at [phone]. TTS attempted to contact guardian but did not get an answer. HIPPA compliant v/m was left for the guardian.  TTS contacted [facility]T Team - [phone] and spoke with someone by the name of "John Copeland" (unsure of correct spelling, individual spelled name quickly and muffled. When asked for clarity on the spelling, individual became irritated and did not wish to spell her name again) who states they will not pick up the pt and requested [facility] provide the pt with a bus ticket back to the [facility].   John Copeland, MSW, LCSW Therapeutic Triage Specialist  [phone]

## 2018-01-14 DIAGNOSIS — F250 Schizoaffective disorder, bipolar type: Secondary | ICD-10-CM

## 2018-04-18 IMAGING — CR DG CHEST 2V
2 series · 2 of 2 positions shown · non-contrast
Comparison: 06/25/2017

CLINICAL DATA: Diarrhea

EXAM:
CHEST  2 VIEW

[chest pa]
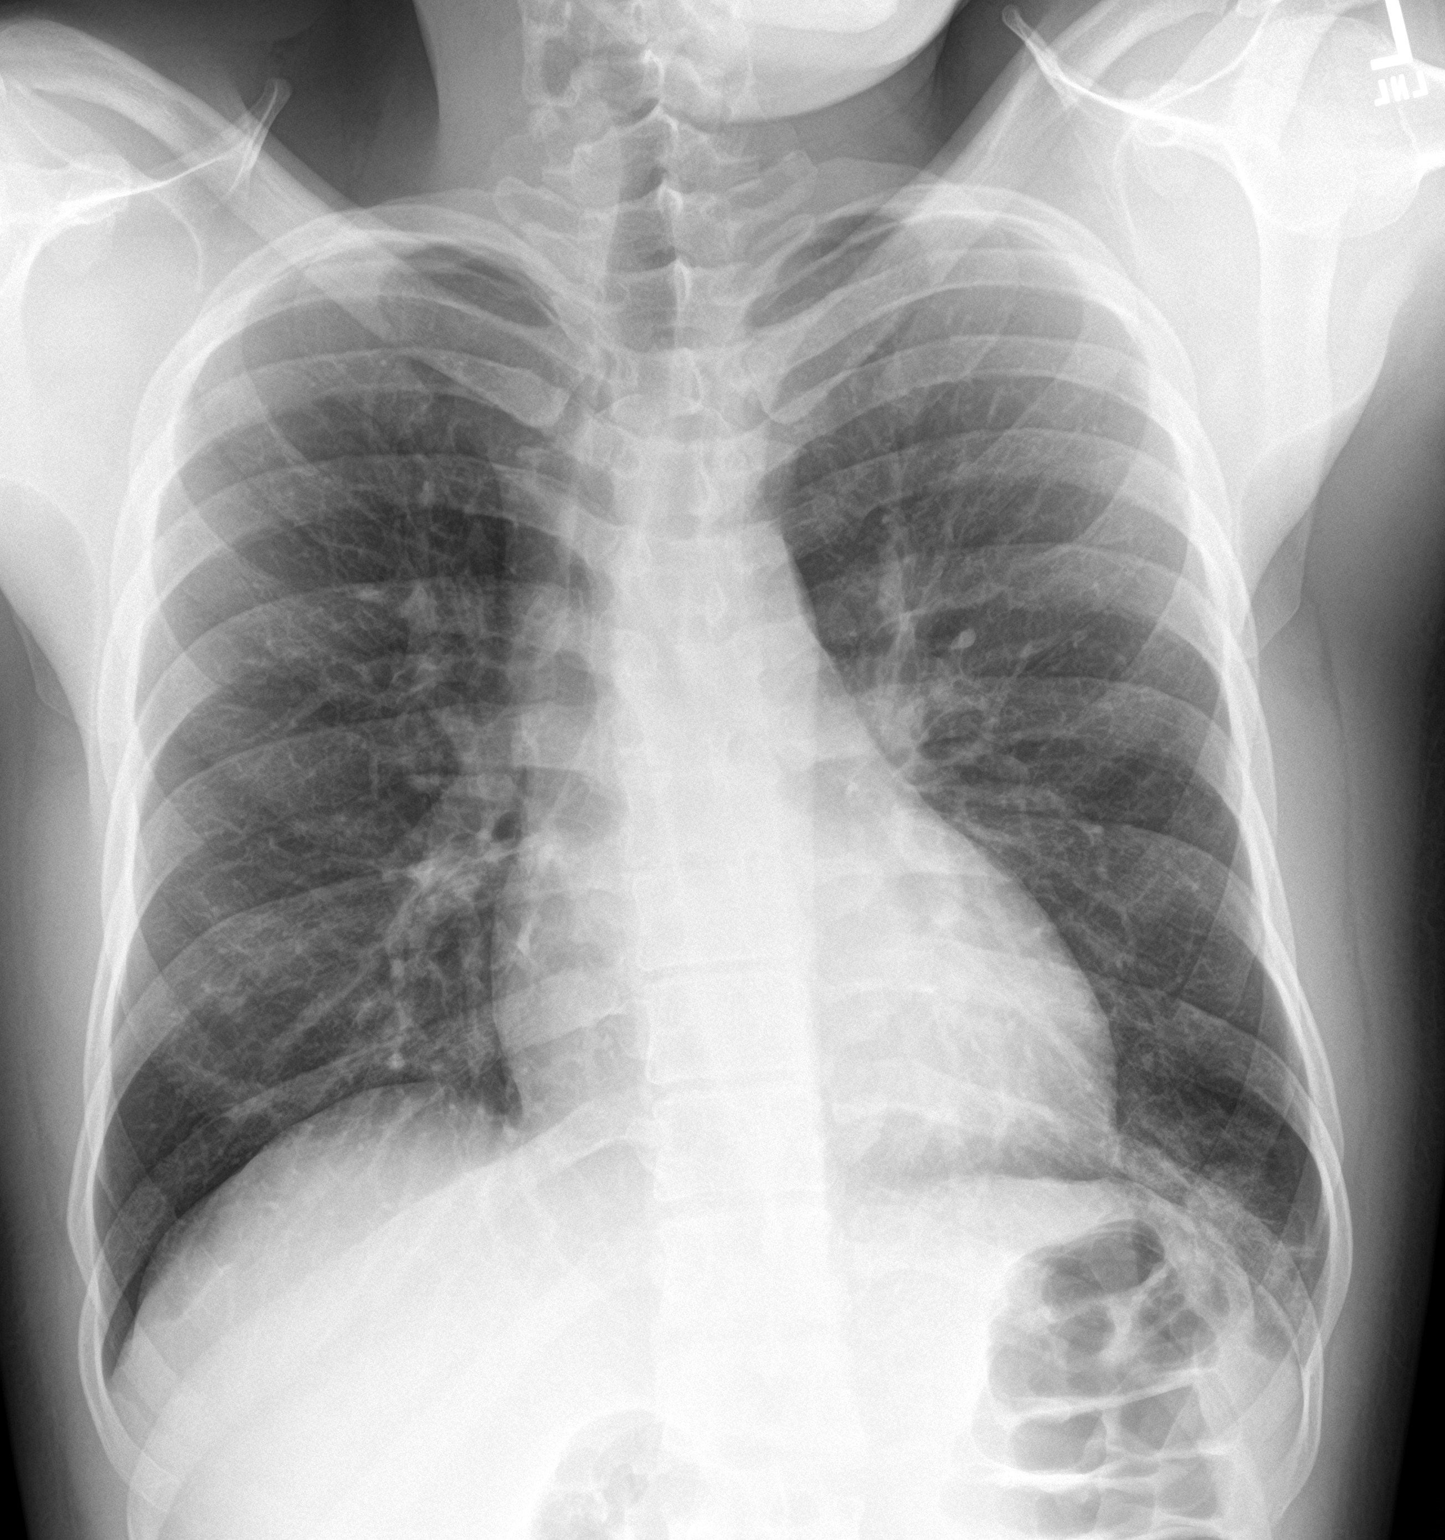

[chest lat]
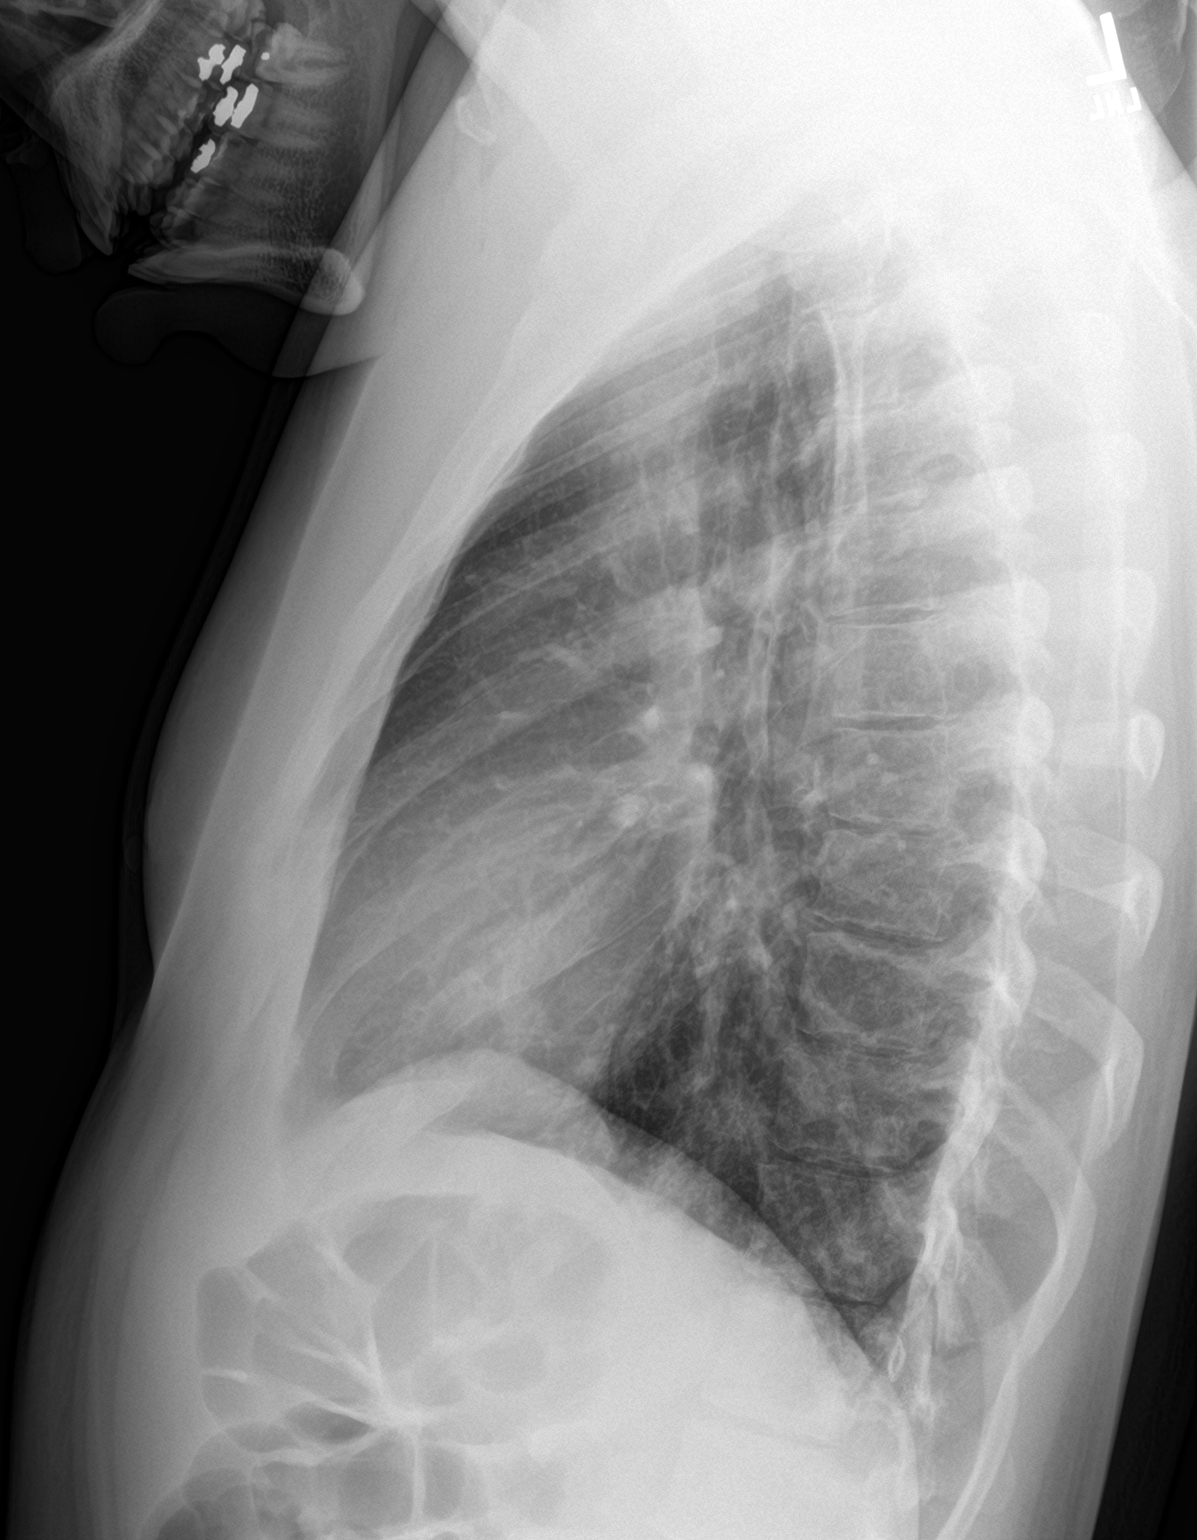

[2 of 2 positions shown; findings below may reference images not displayed]

FINDINGS: Borderline cardiomegaly. No aortic aneurysm. Redemonstration of left
basilar atelectasis. No overt pulmonary edema, effusion or
pneumothorax. No acute osseous abnormality.
IMPRESSION: Borderline cardiomegaly with left basilar atelectasis.

## 2019-04-04 DIAGNOSIS — Z87898 Personal history of other specified conditions: Secondary | ICD-10-CM | POA: Insufficient documentation

## 2020-06-19 ENCOUNTER — Emergency Department: Payer: Medicaid Other

## 2020-06-19 ENCOUNTER — Other Ambulatory Visit: Payer: Self-pay

## 2020-06-19 ENCOUNTER — Encounter: Payer: Self-pay | Admitting: Emergency Medicine

## 2020-06-19 DIAGNOSIS — E119 Type 2 diabetes mellitus without complications: Secondary | ICD-10-CM | POA: Diagnosis not present

## 2020-06-19 DIAGNOSIS — R0789 Other chest pain: Secondary | ICD-10-CM | POA: Insufficient documentation

## 2020-06-19 DIAGNOSIS — R079 Chest pain, unspecified: Secondary | ICD-10-CM | POA: Diagnosis present

## 2020-06-19 DIAGNOSIS — F17210 Nicotine dependence, cigarettes, uncomplicated: Secondary | ICD-10-CM | POA: Diagnosis not present

## 2020-06-19 DIAGNOSIS — I10 Essential (primary) hypertension: Secondary | ICD-10-CM | POA: Insufficient documentation

## 2020-06-19 NOTE — ED Triage Notes (Signed)
Patient from group home: [facility],  via [facility] with complaints of left side CP, pressure 8/10, pt denies radiation or associated symptoms, pt denies cardiac hx or excess caffeine use  Pt reports taking "mental health medicine" that is prescribed    EMS gave 4 x 81mg  ASA

## 2020-06-19 NOTE — ED Triage Notes (Signed)
First Nurse: patient brought in by ems from [facility] assisted living [phone]). Patient with complaint of sharpe non radiating chest pain that started tonight while doing push ups. Patient was given 324 mg asa. Ems vital signs hr 82, bp 148/93 and oxygen saturation 100%.

## 2020-06-20 ENCOUNTER — Emergency Department
Admission: EM | Admit: 2020-06-20 | Discharge: 2020-06-20 | Disposition: A | Discharge status: Home / Self Care | Payer: Medicaid Other | Attending: Emergency Medicine | Admitting: Emergency Medicine

## 2020-06-20 DIAGNOSIS — R0789 Other chest pain: Secondary | ICD-10-CM

## 2020-06-20 LAB — BASIC METABOLIC PANEL
Anion gap: 8 (ref 5–15)
BUN: 7 mg/dL (ref 6–20)
CO2: 23 mmol/L (ref 22–32)
Calcium: 9.4 mg/dL (ref 8.9–10.3)
Chloride: 105 mmol/L (ref 98–111)
Creatinine, Ser: 0.74 mg/dL (ref 0.61–1.24)
GFR, Estimated: >60 mL/min (ref >60)
Glucose, Bld: 91 mg/dL (ref 70–99)
Potassium: 4.2 mmol/L (ref 3.5–5.1)
Sodium: 136 mmol/L (ref 135–145)

## 2020-06-20 LAB — CBC
HCT: 39.1 % (ref 39.0–52.0)
Hemoglobin: 13 g/dL (ref 13.0–17.0)
MCH: 23.8 pg — ABNORMAL LOW (ref 26.0–34.0)
MCHC: 33.2 g/dL (ref 30.0–36.0)
MCV: 71.5 fL — ABNORMAL LOW (ref 80.0–100.0)
Platelets: 297 10*3/uL (ref 150–400)
RBC: 5.47 MIL/uL (ref 4.22–5.81)
RDW: 16.3 % — ABNORMAL HIGH (ref 11.5–15.5)
WBC: 8.6 10*3/uL (ref 4.0–10.5)
nRBC: 0 % (ref 0.0–0.2)

## 2020-06-20 LAB — TROPONIN I (HIGH SENSITIVITY): Troponin I (High Sensitivity): 6 ng/L (ref <18)

## 2020-06-20 MED ORDER — IBUPROFEN 800 MG PO TABS
800.0000 mg | ORAL_TABLET | Freq: Once | ORAL | Status: AC
  Administered 2020-06-20: 800 mg via ORAL
  Filled 2020-06-20: qty 1

## 2020-06-20 MED ORDER — NICOTINE 21 MG/24HR TD PT24
21.0000 mg | MEDICATED_PATCH | Freq: Once | TRANSDERMAL | Status: DC
  Administered 2020-06-20: 21 mg via TRANSDERMAL
  Filled 2020-06-20: qty 1

## 2020-06-20 NOTE — ED Notes (Signed)
Pt left with caregiver/legal guardian

## 2020-06-20 NOTE — ED Notes (Signed)
Writer called multiple times to legal guardian and group home with no answer. Legal guardian, [name] not answering and message left to call back as soon as possible. Message left for group home as well.

## 2020-06-20 NOTE — ED Notes (Signed)
Pt given coffee and ice. Pt updated on waiting for transportation for discharge. Pt calm and cooperative and verbalizes understanding.

## 2020-06-20 NOTE — ED Notes (Addendum)
Charge RN contacted pts Group Home who reported they would not have transportation until after 11:00 this morning. Group Home made aware transportation would be needed sooner than that as patient has been ready for discharge since 300 and ED staff had not been able to contact Pts legal guardian.   Pt updated on situation. Pt was provided with tooth brush and phone charger and has been directed back to treatment room multiple times as he is requesting when he can go home. Pt remains calm and cooperative at this time.

## 2020-06-20 NOTE — Discharge Instructions (Addendum)
You may alternate Tylenol 1000 mg every 6 hours as needed for pain, fever and Ibuprofen 800 mg every 8 hours as needed for pain, fever.  Please take Ibuprofen with food.  Do not take more than 4000 mg of Tylenol (acetaminophen) in a 24 hour period.  These medications are found over-the-counter and do not require prescription.   Steps to find a Primary Care Provider (PCP):  Call [phone] or [phone] to access "[place] Find a Doctor Service."  2.  You may also go on the [facility] website at [email or web address]

## 2020-06-20 NOTE — ED Provider Notes (Addendum)
[facility] Emergency Department Provider Note  ____________________________________________   Event Date/Time   First MD Initiated Contact with Patient 06/20/20 0155     (approximate)  I have reviewed the triage vital signs and the nursing notes.   HISTORY  Chief Complaint Chest Pain    HPI John Copeland is a 30 y.o. male with history of schizophrenia who presents to the emergency department with complaints of chest pain that started while doing push-ups.  Describes it as feeling like "my chest is caving in".  Worse with palpation.  No other aggravating or alleviating factors.  No associated shortness of breath, nausea, vomiting, fever, cough, diarrhea.  No medications tried prior to arrival.  Patient lives in a group home.  Came in by EMS.  No history of PE, DVT, exogenous estrogen use, recent fractures, surgery, trauma, hospitalization, prolonged travel or other immobilization. No lower extremity swelling or pain. No calf tenderness.  Denies history of ACS.        Past Medical History:  Diagnosis Date  . Anxiety   . Depression   . Diabetes mellitus without complication (HCC)   . Homelessness   . Hypertension   . Schizophrenia [facility])     Patient Active Problem List   Diagnosis Date Noted  . Left lower lobe pneumonia 06/26/2017  . Accidental drug overdose   . Schizophrenia (HCC) 03/29/2017  . 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 Diphenhydramine overdose of undetermined intent 01/10/2017  . Tobacco use disorder 10/07/2016  . Diabetes (HCC) 10/07/2016  . HTN (hypertension) 10/07/2016  . Dyslipidemia 10/07/2016  . Schizoaffective disorder, bipolar type (HCC) 11/30/2015    History reviewed. No pertinent surgical history.  Prior to Admission medications   Medication Sig Start Date End Date Taking? Authorizing Provider  amoxicillin (AMOXIL) 500 MG capsule Take 1 capsule (500 mg total) by mouth 3 (three) times daily. Patient not taking: Reported on  07/04/2017 06/28/17   Palumbo, April, MD  ARIPiprazole (ABILIFY) 15 MG tablet Take 1 tablet (15 mg total) by mouth daily for 14 days. For mood control 07/01/17 07/15/17  Long, Arlyss Repress, MD  atomoxetine (STRATTERA) 18 MG capsule Take 1 capsule (18 mg total) by mouth daily. For ADHD Patient not taking: Reported on 06/26/2017 06/25/17   Armandina Stammer I, NP  benzonatate (TESSALON) 100 MG capsule Take 1 capsule (100 mg total) by mouth 3 (three) times daily as needed for cough. Patient not taking: Reported on 06/26/2017 06/24/17   Petrucelli, Lelon Mast R, PA-C  divalproex (DEPAKOTE) 500 MG DR tablet Take 1 tablet (500 mg total) by mouth every 12 (twelve) hours for 14 days. For mood stabilization 07/01/17 07/15/17  Long, Arlyss Repress, MD  fluticasone (FLONASE) 50 MCG/ACT nasal spray Place 1 spray into both nostrils daily. Patient not taking: Reported on 06/26/2017 06/24/17   Petrucelli, Pleas Koch, PA-C  ibuprofen (ADVIL,MOTRIN) 800 MG tablet Take 1 tablet (800 mg total) by mouth 3 (three) times daily. Patient not taking: Reported on 06/26/2017 06/24/17   Petrucelli, Pleas Koch, PA-C  pantoprazole (PROTONIX) 20 MG tablet Take 1 tablet (20 mg total) by mouth daily. 07/15/17   Gilda Crease, MD  pseudoephedrine (SUDAFED 12 HOUR) 120 MG 12 hr tablet Take 1 tablet (120 mg total) by mouth 2 (two) times daily. Patient not taking: Reported on 07/12/2017 07/06/17   Melene Plan, DO  traZODone (DESYREL) 50 MG tablet Take 1 tablet (50 mg total) by mouth at bedtime as needed for sleep. Patient not taking: Reported on 06/26/2017  06/24/17   Sanjuana Kava, NP    Allergies Haldol [haloperidol lactate]  Family History  Problem Relation Age of Onset  . Diabetes Other     Social History Social History   Tobacco Use  . Smoking status: Current Every Day Smoker    Packs/day: 0.00    Types: Cigarettes  . Smokeless tobacco: Never Used  Vaping Use  . Vaping Use: Never used  Substance Use Topics  . Alcohol use: No  . Drug use: Yes     Types: Marijuana    Comment: denies drug use    Review of Systems Constitutional: No fever. Eyes: No visual changes. ENT: No sore throat. Cardiovascular: +chest pain. Respiratory: Denies shortness of breath. Gastrointestinal: No nausea, vomiting, diarrhea. Genitourinary: Negative for dysuria. Musculoskeletal: Negative for back pain. Skin: Negative for rash. Neurological: Negative for focal weakness or numbness.  ____________________________________________   PHYSICAL EXAM:  VITAL SIGNS: ED Triage Vitals  Enc Vitals Group     BP 06/19/20 2348 (!) 158/97     Pulse Rate 06/19/20 2348 87     Resp 06/19/20 2348 18     Temp 06/19/20 2348 98.2 F (36.8 C)     Temp Source 06/19/20 2348 Oral     SpO2 06/19/20 2348 98 %     Weight 06/19/20 2349 190 lb (86.2 kg)     Height 06/19/20 2349 5\' 11"  (1.803 m)     Head Circumference --      Peak Flow --      Pain Score 06/19/20 2349 8     Pain Loc --      Pain Edu? --      Excl. in GC? --    CONSTITUTIONAL: Alert and oriented and responds appropriately to questions. Well-appearing; well-nourished HEAD: Normocephalic EYES: Conjunctivae clear, pupils appear equal, EOM appear intact ENT: normal nose; moist mucous membranes NECK: Supple, normal ROM CARD: RRR; S1 and S2 appreciated; no murmurs, no clicks, no rubs, no gallops CHEST:  Chest wall is tender to palpation across the anterior chest wall which reproduces his pain.  No crepitus, ecchymosis, erythema, warmth, rash or other lesions present.   RESP: Normal chest excursion without splinting or tachypnea; breath sounds clear and equal bilaterally; no wheezes, no rhonchi, no rales, no hypoxia or respiratory distress, speaking full sentences ABD/GI: Normal bowel sounds; non-distended; soft, non-tender, no rebound, no guarding, no peritoneal signs, no hepatosplenomegaly BACK: The back appears normal EXT: Normal ROM in all joints; no deformity noted, no edema; no cyanosis, no calf  tenderness or calf swelling SKIN: Normal color for age and race; warm; no rash on exposed skin NEURO: Moves all extremities equally PSYCH: Patient has an odd affect but is calm, cooperative.  Does not appear to be responding to internal stimuli.  Resting comfortably in bed with the Bible on the bedside table next to him.  ____________________________________________   LABS (all labs ordered are listed, but only abnormal results are displayed)  Labs Reviewed  CBC - Abnormal; Notable for the following components:      Result Value   MCV 71.5 (*)    MCH 23.8 (*)    RDW 16.3 (*)    All other components within normal limits  BASIC METABOLIC PANEL  TROPONIN I (HIGH SENSITIVITY)   ____________________________________________  EKG   EKG Interpretation  Date/Time:  Sunday June 19 2020 23:37:31 EST Ventricular Rate:  80 PR Interval:  168 QRS Duration: 90 QT Interval:  364 QTC Calculation: 419 R Axis:   -  89 Text Interpretation: Normal sinus rhythm Left anterior fascicular block Abnormal ECG No significant change since last tracing Confirmed by Rochele Raring [phone]) on 06/20/2020 2:07:30 AM       ____________________________________________  RADIOLOGY Normajean Baxter Satina Jerrell, personally viewed and evaluated these images (plain radiographs) as part of my medical decision making, as well as reviewing the written report by the radiologist.  ED MD interpretation: Chest x-ray clear.  Official radiology report(s): DG Chest 2 View  Result Date: 06/20/2020 CLINICAL DATA:  Chest pain EXAM: CHEST - 2 VIEW COMPARISON:  07/11/2017 FINDINGS: The heart size and mediastinal contours are within normal limits. Both lungs are clear. The visualized skeletal structures are unremarkable. IMPRESSION: Negative. Electronically Signed   By: Charlett Nose M.D.   On: 06/20/2020 00:16    ____________________________________________   PROCEDURES  Procedure(s) performed (including Critical  Care):  None  ____________________________________________   INITIAL IMPRESSION / ASSESSMENT AND PLAN / ED COURSE  As part of my medical decision making, I reviewed the following data within the electronic MEDICAL RECORD NUMBER Nursing notes reviewed and incorporated, Labs reviewed, EKG interpreted NSR, Old EKG reviewed, Old chart reviewed and Notes from prior ED visits         Patient here with atypical chest pain.  Seems to be musculoskeletal in nature.  Doubt ACS, PE or dissection.  Troponin is normal.  EKG nonischemic.  He is PERC negative.  Chest x-ray clear.  No signs of volume overload.  Will give ibuprofen.  He is requesting something to eat here.  I feel he is safe to be discharged back to his group home and alternate Tylenol and ibuprofen as needed for discomfort.  At this time, I do not feel there is any life-threatening condition present. I have reviewed, interpreted and discussed all results (EKG, imaging, lab, urine as appropriate) and exam findings with patient/family. I have reviewed nursing notes and appropriate previous records.  I feel the patient is safe to be discharged home without further emergent workup and can continue workup as an outpatient as needed. Discussed usual and customary return precautions. Patient/family verbalize understanding and are comfortable with this plan.  Outpatient follow-up has been provided as needed. All questions have been answered.     5:55 AM  Charge nurse attempting to get in touch with group home multiple times without success.  ____________________________________________   FINAL CLINICAL IMPRESSION(S) / ED DIAGNOSES  Final diagnoses:  Chest wall pain     ED Discharge Orders    None      *Please note:  Hamdan Toscano was evaluated in Emergency Department on 06/20/2020 for the symptoms described in the history of present illness. He was evaluated in the context of the global COVID-19 pandemic, which necessitated consideration  that the patient might be at risk for infection with the SARS-CoV-2 virus that causes COVID-19. Institutional protocols and algorithms that pertain to the evaluation of patients at risk for COVID-19 are in a state of rapid change based on information released by regulatory bodies including the CDC and federal and state organizations. These policies and algorithms were followed during the patient's care in the ED.  Some ED evaluations and interventions may be delayed as a result of limited staffing during and the pandemic.*   Note:  This document was prepared using Dragon voice recognition software and may include unintentional dictation errors.   Grizel Vesely, Layla Maw, DO 06/20/20 0226    Tonea Leiphart, Layla Maw, DO 06/20/20 [id number]

## 2020-06-20 NOTE — Progress Notes (Signed)
   06/20/20 0715  Clinical Encounter Type  Visited With Patient  Visit Type Initial  Referral From Patient  Consult/Referral To Chaplain  Spiritual Encounters  Spiritual Needs Prayer   As the on-call chaplain, I responded to John Copeland's request to speak with a chaplain. When I arrived we spoke briefly, but he requested that we have prayer. I prayed with him.  Chaplain Trudie Buckler, [place]

## 2020-06-20 NOTE — ED Notes (Signed)
This RN in contact with pt's legal guardian [phone]. Guardian stating ok for pt to go back to group home in taxi.

## 2020-06-22 ENCOUNTER — Emergency Department
Admission: EM | Admit: 2020-06-22 | Discharge: 2020-06-22 | Disposition: A | Discharge status: Home / Self Care | Payer: Medicaid Other

## 2020-06-22 NOTE — ED Notes (Addendum)
Pt to ED dropped off by police voluntarily.  Pt denies SI/HI or hallucinations, when asked about wanting to see a doctor he says no.  Patient not answering any other questions and just staring blankly at RN.  Pt ambulatory with steady gait, chest rise even and unlabored.  Pt got up and walked out of triage room and to lobby without answering more questions.  Attempted to call guadrian without answer.

## 2020-06-23 ENCOUNTER — Encounter: Payer: Self-pay | Admitting: Emergency Medicine

## 2020-06-23 ENCOUNTER — Emergency Department
Admission: EM | Admit: 2020-06-23 | Discharge: 2020-06-23 | Disposition: A | Discharge status: Home / Self Care | Payer: No Typology Code available for payment source | Attending: Emergency Medicine | Admitting: Emergency Medicine

## 2020-06-23 ENCOUNTER — Other Ambulatory Visit: Payer: Self-pay

## 2020-06-23 DIAGNOSIS — F250 Schizoaffective disorder, bipolar type: Secondary | ICD-10-CM | POA: Insufficient documentation

## 2020-06-23 DIAGNOSIS — Z5900 Homelessness unspecified: Secondary | ICD-10-CM | POA: Diagnosis not present

## 2020-06-23 DIAGNOSIS — E785 Hyperlipidemia, unspecified: Secondary | ICD-10-CM | POA: Diagnosis present

## 2020-06-23 DIAGNOSIS — F209 Schizophrenia, unspecified: Secondary | ICD-10-CM | POA: Diagnosis present

## 2020-06-23 DIAGNOSIS — F17200 Nicotine dependence, unspecified, uncomplicated: Secondary | ICD-10-CM | POA: Diagnosis present

## 2020-06-23 DIAGNOSIS — E871 Hypo-osmolality and hyponatremia: Secondary | ICD-10-CM | POA: Insufficient documentation

## 2020-06-23 DIAGNOSIS — Z87891 Personal history of nicotine dependence: Secondary | ICD-10-CM | POA: Diagnosis not present

## 2020-06-23 DIAGNOSIS — I10 Essential (primary) hypertension: Secondary | ICD-10-CM | POA: Insufficient documentation

## 2020-06-23 DIAGNOSIS — E119 Type 2 diabetes mellitus without complications: Secondary | ICD-10-CM | POA: Diagnosis not present

## 2020-06-23 LAB — CBC
HCT: 36.4 % — ABNORMAL LOW (ref 39.0–52.0)
Hemoglobin: 11.8 g/dL — ABNORMAL LOW (ref 13.0–17.0)
MCH: 23.2 pg — ABNORMAL LOW (ref 26.0–34.0)
MCHC: 32.4 g/dL (ref 30.0–36.0)
MCV: 71.5 fL — ABNORMAL LOW (ref 80.0–100.0)
Platelets: 254 10*3/uL (ref 150–400)
RBC: 5.09 MIL/uL (ref 4.22–5.81)
RDW: 15.3 % (ref 11.5–15.5)
WBC: 6.8 10*3/uL (ref 4.0–10.5)
nRBC: 0 % (ref 0.0–0.2)

## 2020-06-23 LAB — COMPREHENSIVE METABOLIC PANEL
ALT: 32 U/L (ref 0–44)
AST: 36 U/L (ref 15–41)
Albumin: 3.8 g/dL (ref 3.5–5.0)
Alkaline Phosphatase: 40 U/L (ref 38–126)
Anion gap: 11 (ref 5–15)
BUN: 6 mg/dL (ref 6–20)
CO2: 24 mmol/L (ref 22–32)
Calcium: 9 mg/dL (ref 8.9–10.3)
Chloride: 91 mmol/L — ABNORMAL LOW (ref 98–111)
Creatinine, Ser: 0.82 mg/dL (ref 0.61–1.24)
GFR, Estimated: >60 mL/min (ref >60)
Glucose, Bld: 119 mg/dL — ABNORMAL HIGH (ref 70–99)
Potassium: 3.6 mmol/L (ref 3.5–5.1)
Sodium: 126 mmol/L — ABNORMAL LOW (ref 135–145)
Total Bilirubin: 0.8 mg/dL (ref 0.3–1.2)
Total Protein: 6.7 g/dL (ref 6.5–8.1)

## 2020-06-23 LAB — BASIC METABOLIC PANEL
Anion gap: 10 (ref 5–15)
BUN: 6 mg/dL (ref 6–20)
CO2: 24 mmol/L (ref 22–32)
Calcium: 9.1 mg/dL (ref 8.9–10.3)
Chloride: 97 mmol/L — ABNORMAL LOW (ref 98–111)
Creatinine, Ser: 0.8 mg/dL (ref 0.61–1.24)
GFR, Estimated: >60 mL/min (ref >60)
Glucose, Bld: 94 mg/dL (ref 70–99)
Potassium: 4.1 mmol/L (ref 3.5–5.1)
Sodium: 131 mmol/L — ABNORMAL LOW (ref 135–145)

## 2020-06-23 LAB — SALICYLATE LEVEL: Salicylate Lvl: <7 mg/dL — ABNORMAL LOW (ref 7.0–30.0)

## 2020-06-23 LAB — ACETAMINOPHEN LEVEL: Acetaminophen (Tylenol), Serum: <10 ug/mL — ABNORMAL LOW (ref 10–30)

## 2020-06-23 LAB — ETHANOL: Alcohol, Ethyl (B): <10 mg/dL (ref <10)

## 2020-06-23 MED ORDER — SODIUM CHLORIDE 1 G PO TABS
1.0000 g | ORAL_TABLET | Freq: Once | ORAL | Status: AC
  Administered 2020-06-23: 1 g via ORAL
  Filled 2020-06-23: qty 1

## 2020-06-23 NOTE — ED Notes (Signed)
Chaplin in to see patient.

## 2020-06-23 NOTE — ED Provider Notes (Signed)
Emergency Medicine Observation Re-evaluation Note  John Copeland is a 30 y.o. male, seen on rounds today.  Pt initially presented to the ED for complaints of Paranoid  Currently, the patient is is no acute distress. Denies any concerns at this time.  Physical Exam  Blood pressure (!) 154/89, pulse 81, temperature 97.8 F (36.6 C), temperature source Oral, resp. rate 16, height 5\' 11"  (1.803 m), weight 86 kg, SpO2 99 %.  Physical Exam General: No apparent distress HEENT: moist mucous membranes CV: RRR Pulm: Normal WOB GI: soft and non tender MSK: no edema or cyanosis Neuro: face symmetric, moving all extremities     ED Course / MDM     I have reviewed the labs performed to date as well as medications administered while in observation.  Recent changes in the last 24 hours include repeat sodium test was improved at 131.  At this time patient medically cleared  Plan   Current plan is to continue to wait for psych plan/placement if felt warranted  Patient is not under full IVC at this time.   , MD 06/23/20 1058

## 2020-06-23 NOTE — ED Notes (Signed)
Belongings returned to patient. Bag 2/2 returned to patient

## 2020-06-23 NOTE — ED Notes (Signed)
This RN contacted group home at 1157. Worker informed this Nurse, mental health would return in 15 minutes and call this RN back to discuss if patient can go back to facility.

## 2020-06-23 NOTE — ED Notes (Signed)
Attempted to contact legal guardian without answer at this time.

## 2020-06-23 NOTE — BH Assessment (Signed)
Comprehensive Clinical Assessment (CCA) Note  06/23/2020 John Copeland [id number]  Chief Complaint: Patient is a 30 year old male presenting to [facility] ED voluntarily. Per triage note Pt's second time to ED tonight.  Stating to this RN this time that he ran away from his group home because he's scared to go to sleep there for fear of people messing with him while he sleeps.  Pt denies SI/HI or hallucinations.  Pt calm and cooperative in triage, wanting to see a doctor this time. Group home is [facility].During assessment patient presents alert and oriented x4, calm and cooperative, mood is pleasant. When asked why patient is presenting to the ED he reports "I was at the group home, I'm not sure what happened." Patient does however report that he is taking his medications as prescribed and denies SI/HI/AH/VH and does not appear to be responding to any internal or external stimuli.  Per Psyc NP Lerry Liner patient to be reassessed Chief Complaint  Patient presents with  . Paranoid   Visit Diagnosis: Schizophrenia   CCA Screening, Triage and Referral (STR)  Patient Reported Information How did you hear about [place]? Self  Referral name: No data recorded Referral phone number: No data recorded  Whom do you see for routine medical problems? Other (Comment)  Practice/Facility Name: No data recorded Practice/Facility Phone Number: No data recorded Name of Contact: No data recorded Contact Number: No data recorded Contact Fax Number: No data recorded Prescriber Name: No data recorded Prescriber Address (if known): No data recorded  What Is the Reason for Your Visit/Call Today? No data recorded How Long Has This Been Causing You Problems? > than 6 months  What Do You Feel Would Help You the Most Today? Assessment Only; Therapy; Medication   Have You Recently Been in Any Inpatient Treatment (Hospital/Detox/Crisis Center/28-Day Program)? No data recorded Name/Location of  Program/Hospital:No data recorded How Long Were You There? No data recorded When Were You Discharged? No data recorded  Have You Ever Received Services From [facility] Before? No  Who Do You See at [facility]? No data recorded  Have You Recently Had Any Thoughts About Hurting Yourself? No  Are You Planning to Commit Suicide/Harm Yourself At This time? No   Have you Recently Had Thoughts About Hurting Someone Karolee Ohs? No  Explanation: No data recorded  Have You Used Any Alcohol or Drugs in the Past 24 Hours? No  How Long Ago Did You Use Drugs or Alcohol? No data recorded What Did You Use and How Much? No data recorded  Do You Currently Have a Therapist/Psychiatrist? No  Name of Therapist/Psychiatrist: No data recorded  Have You Been Recently Discharged From Any Office Practice or Programs? No  Explanation of Discharge From Practice/Program: No data recorded    CCA Screening Triage Referral Assessment Type of Contact: Face-to-Face  Is this Initial or Reassessment? No data recorded Date Telepsych consult ordered in [facility]:  No data recorded Time Telepsych consult ordered in [facility]:  No data recorded  Patient Reported Information Reviewed? Yes  Patient Left Without Being Seen? No data recorded Reason for Not Completing Assessment: No data recorded  Collateral Involvement: No data recorded  Does Patient Have a Court Appointed Legal Guardian? No data recorded Name and Contact of Legal Guardian: No data recorded If Minor and Not Living with Parent(s), Who has Custody? No data recorded Is CPS involved or ever been involved? Never  Is APS involved or ever been involved? Never   Patient Determined  To Be At Risk for Harm To Self or Others Based on Review of Patient Reported Information or Presenting Complaint? No  Method: No data recorded Availability of Means: No data recorded Intent: No data recorded Notification Required: No data recorded Additional Information for Danger  to Others Potential: No data recorded Additional Comments for Danger to Others Potential: No data recorded Are There Guns or Other Weapons in Your Home? No data recorded Types of Guns/Weapons: No data recorded Are These Weapons Safely Secured?                            No data recorded Who Could Verify You Are Able To Have These Secured: No data recorded Do You Have any Outstanding Charges, Pending Court Dates, Parole/Probation? No data recorded Contacted To Inform of Risk of Harm To Self or Others: No data recorded  Location of Assessment: [facility] ED   Does Patient Present under Involuntary Commitment? No  IVC Papers Initial File Date: No data recorded  [place] of Residence: [place]   Patient Currently Receiving the Following Services: No data recorded  Determination of Need: Emergent (2 hours)   Options For Referral: No data recorded    CCA Biopsychosocial Intake/Chief Complaint:  Patient ran away from group home  Current Symptoms/Problems: Patient ran away from group home   Patient Reported Schizophrenia/Schizoaffective Diagnosis in Past: No   Strengths: Unknown  Preferences: Unknown  Abilities: Unknown   Type of Services Patient Feels are Needed: Unknown   Initial Clinical Notes/Concerns: None   Mental Health Symptoms Depression:  None   Duration of Depressive symptoms: No data recorded  Mania:  None   Anxiety:   None   Psychosis:  None   Duration of Psychotic symptoms: No data recorded  Trauma:  None   Obsessions:  None   Compulsions:  None   Inattention:  None   Hyperactivity/Impulsivity:  N/A   Oppositional/Defiant Behaviors:  None   Emotional Irregularity:  None   Other Mood/Personality Symptoms:  No data recorded   Mental Status Exam Appearance and self-care  Stature:  Average   Weight:  Average weight   Clothing:  Casual   Grooming:  Normal   Cosmetic use:  None   Posture/gait:  Normal   Motor activity:  Not  Remarkable   Sensorium  Attention:  Normal   Concentration:  Normal   Orientation:  X5   Recall/memory:  Defective in Short-term   Affect and Mood  Affect:  Appropriate   Mood:  Other (Comment)   Relating  Eye contact:  Normal   Facial expression:  Responsive   Attitude toward examiner:  Cooperative   Thought and Language  Speech flow: Clear and Coherent   Thought content:  Appropriate to Mood and Circumstances   Preoccupation:  None   Hallucinations:  None   Organization:  No data recorded  [employer] of Knowledge:  Fair   Intelligence:  Average   Abstraction:  Normal   Judgement:  Fair   Dance movement psychotherapist:  Adequate   Insight:  Fair   Decision Making:  Impulsive   Social Functioning  Social Maturity:  Responsible   Social Judgement:  Normal   Stress  Stressors:  Other (Comment)   Coping Ability:  Normal   Skill Deficits:  None   Supports:  Other (Comment)     Religion: Religion/Spirituality Are You A Religious Person?: No  Leisure/Recreation: Leisure / Recreation Do You  Have Hobbies?: No  Exercise/Diet: Exercise/Diet Do You Exercise?: No Have You Gained or Lost A Significant Amount of Weight in the Past Six Months?: No Do You Follow a Special Diet?: No Do You Have Any Trouble Sleeping?: No   CCA Employment/Education Employment/Work Situation: Employment / Work Situation Employment situation: On disability Why is patient on disability: Mental Illness How long has patient been on disability: Unknown  Education: Education Is Patient Currently Attending School?: No Did Garment/textile technologist From [employer]?:  (Unknown)   CCA Family/Childhood History Family and Relationship History: Family history Marital status: Single Are you sexually active?:  (Unknown) What is your sexual orientation?: Unknown Does patient have children?:  (Unknown)  Childhood History:  Childhood History By whom was/is the patient raised?:  Both parents Additional childhood history information: Unknown Description of patient's relationship with caregiver when they were a child: Unknown Patient's description of current relationship with people who raised him/her: Unknown How were you disciplined when you got in trouble as a child/adolescent?: Unknown Does patient have siblings?:  (Unknown) Did patient suffer any verbal/emotional/physical/sexual abuse as a child?:  (Unknown) Did patient suffer from severe childhood neglect?:  (Unknown) Has patient ever been sexually abused/assaulted/raped as an adolescent or adult?:  (Unknown) Was the patient ever a victim of a crime or a disaster?:  (Unknown) Witnessed domestic violence?:  (Unknown) Has patient been affected by domestic violence as an adult?:  (Unknown)  Child/Adolescent Assessment:     CCA Substance Use Alcohol/Drug Use: Alcohol / Drug Use Pain Medications: See MAR Prescriptions: See MAR Over the Counter: See MAR History of alcohol / drug use?: No history of alcohol / drug abuse                         ASAM's:  Six Dimensions of Multidimensional Assessment  Dimension 1:  Acute Intoxication and/or Withdrawal Potential:      Dimension 2:  Biomedical Conditions and Complications:      Dimension 3:  Emotional, Behavioral, or Cognitive Conditions and Complications:     Dimension 4:  Readiness to Change:     Dimension 5:  Relapse, Continued use, or Continued Problem Potential:     Dimension 6:  Recovery/Living Environment:     ASAM Severity Score:    ASAM Recommended Level of Treatment:     Substance use Disorder (SUD)    Recommendations for Services/Supports/Treatments:   Per Psyc NP Rashaun Dixon patient to be reassessed  DSM5 Diagnoses: Patient Active Problem List   Diagnosis Date Noted  . Left lower lobe pneumonia 06/26/2017  . Accidental drug overdose   . Schizophrenia (HCC) 03/29/2017  . 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 Diphenhydramine overdose of  undetermined intent 01/10/2017  . Tobacco use disorder 10/07/2016  . Diabetes (HCC) 10/07/2016  . HTN (hypertension) 10/07/2016  . Dyslipidemia 10/07/2016  . Schizoaffective disorder, bipolar type (HCC) 11/30/2015    Patient Centered Plan: Patient is on the following Treatment Plan(s):  Schizophrenia   Referrals to Alternative Service(s): Referred to Alternative Service(s):   Place:   Date:   Time:    Referred to Alternative Service(s):   Place:   Date:   Time:    Referred to Alternative Service(s):   Place:   Date:   Time:    Referred to Alternative Service(s):   Place:   Date:   Time:     Dakayla Disanti A Clarabel Marion, LCAS-A

## 2020-06-23 NOTE — ED Provider Notes (Signed)
[facility] Emergency Department Provider Note  ____________________________________________  Time seen: Approximately 1:53 AM  I have reviewed the triage vital signs and the nursing notes.   HISTORY  Chief Complaint Paranoid   HPI John Copeland is a 30 y.o. male with a history of schizophrenia, diabetes, hypertension who presents from his group home for concerns for his safety.  Patient was dropped off by police here voluntarily.  Per police, he called 911 because he said that he was scared to fall asleep tonight because he was afraid of people messing up with him why he sleeps at the group home.  On arrival to the ED patient tells me that he is not sure why he is here but he denies SI or HI.  He tells me something happen at the group home but he is not sure what it was.  Past Medical History:  Diagnosis Date  . Anxiety   . Depression   . Diabetes mellitus without complication (HCC)   . Homelessness   . Hypertension   . Schizophrenia [facility])     Patient Active Problem List   Diagnosis Date Noted  . Left lower lobe pneumonia 06/26/2017  . Accidental drug overdose   . Schizophrenia (HCC) 03/29/2017  . 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 Diphenhydramine overdose of undetermined intent 01/10/2017  . Tobacco use disorder 10/07/2016  . Diabetes (HCC) 10/07/2016  . HTN (hypertension) 10/07/2016  . Dyslipidemia 10/07/2016  . Schizoaffective disorder, bipolar type (HCC) 11/30/2015    History reviewed. No pertinent surgical history.  Prior to Admission medications   Medication Sig Start Date End Date Taking? Authorizing Provider  amoxicillin (AMOXIL) 500 MG capsule Take 1 capsule (500 mg total) by mouth 3 (three) times daily. Patient not taking: Reported on 07/04/2017 06/28/17   Palumbo, April, MD  ARIPiprazole (ABILIFY) 15 MG tablet Take 1 tablet (15 mg total) by mouth daily for 14 days. For mood control 07/01/17 07/15/17  Long, Arlyss Repress, MD  atomoxetine  (STRATTERA) 18 MG capsule Take 1 capsule (18 mg total) by mouth daily. For ADHD Patient not taking: Reported on 06/26/2017 06/25/17   Armandina Stammer I, NP  benzonatate (TESSALON) 100 MG capsule Take 1 capsule (100 mg total) by mouth 3 (three) times daily as needed for cough. Patient not taking: Reported on 06/26/2017 06/24/17   Petrucelli, Lelon Mast R, PA-C  divalproex (DEPAKOTE) 500 MG DR tablet Take 1 tablet (500 mg total) by mouth every 12 (twelve) hours for 14 days. For mood stabilization 07/01/17 07/15/17  Long, Arlyss Repress, MD  fluticasone (FLONASE) 50 MCG/ACT nasal spray Place 1 spray into both nostrils daily. Patient not taking: Reported on 06/26/2017 06/24/17   Petrucelli, Pleas Koch, PA-C  ibuprofen (ADVIL,MOTRIN) 800 MG tablet Take 1 tablet (800 mg total) by mouth 3 (three) times daily. Patient not taking: Reported on 06/26/2017 06/24/17   Petrucelli, Pleas Koch, PA-C  pantoprazole (PROTONIX) 20 MG tablet Take 1 tablet (20 mg total) by mouth daily. 07/15/17   Gilda Crease, MD  pseudoephedrine (SUDAFED 12 HOUR) 120 MG 12 hr tablet Take 1 tablet (120 mg total) by mouth 2 (two) times daily. Patient not taking: Reported on 07/12/2017 07/06/17   Melene Plan, DO  traZODone (DESYREL) 50 MG tablet Take 1 tablet (50 mg total) by mouth at bedtime as needed for sleep. Patient not taking: Reported on 06/26/2017 06/24/17   Armandina Stammer I, NP    Allergies Haldol [haloperidol lactate]  Family History  Problem Relation Age of Onset  .  Diabetes Other     Social History Social History   Tobacco Use  . Smoking status: Former Smoker    Packs/day: 0.00    Types: Cigarettes  . Smokeless tobacco: Never Used  Vaping Use  . Vaping Use: Never used  Substance Use Topics  . Alcohol use: No  . Drug use: Yes    Types: Marijuana    Comment: denies drug use    Review of Systems  Constitutional: Negative for fever. Eyes: Negative for visual changes. ENT: Negative for sore throat. Neck: No neck pain   Cardiovascular: Negative for chest pain. Respiratory: Negative for shortness of breath. Gastrointestinal: Negative for abdominal pain, vomiting or diarrhea. Genitourinary: Negative for dysuria. Musculoskeletal: Negative for back pain. Skin: Negative for rash. Neurological: Negative for headaches, weakness or numbness. Psych: No SI or HI  ____________________________________________   PHYSICAL EXAM:  VITAL SIGNS: ED Triage Vitals [06/23/20 0032]  Enc Vitals Group     BP (!) 154/89     Pulse Rate 81     Resp 16     Temp 97.8 F (36.6 C)     Temp Source Oral     SpO2 99 %     Weight 189 lb 9.5 oz (86 kg)     Height 5\' 11"  (1.803 m)     Head Circumference      Peak Flow      Pain Score 0     Pain Loc      Pain Edu?      Excl. in GC?     Constitutional: Alert and oriented. Well appearing and in no apparent distress. HEENT:      Head: Normocephalic and atraumatic.         Eyes: Conjunctivae are normal. Sclera is non-icteric.       Mouth/Throat: Mucous membranes are moist.       Neck: Supple with no signs of meningismus. Cardiovascular: Regular rate and rhythm.  Respiratory: Normal respiratory effort.  Gastrointestinal: Soft, non tender, and non distended. Musculoskeletal: No edema, cyanosis, or erythema of extremities. Neurologic: Normal speech and language. Face is symmetric. Moving all extremities. No gross focal neurologic deficits are appreciated. Skin: Skin is warm, dry and intact. No rash noted. Psychiatric: Mood and affect are normal. Speech and behavior are normal.  ____________________________________________   LABS (all labs ordered are listed, but only abnormal results are displayed)  Labs Reviewed  COMPREHENSIVE METABOLIC PANEL - Abnormal; Notable for the following components:      Result Value   Sodium 126 (*)    Chloride 91 (*)    Glucose, Bld 119 (*)    All other components within normal limits  SALICYLATE LEVEL - Abnormal; Notable for the  following components:   Salicylate Lvl <7.0 (*)    All other components within normal limits  ACETAMINOPHEN LEVEL - Abnormal; Notable for the following components:   Acetaminophen (Tylenol), Serum <10 (*)    All other components within normal limits  CBC - Abnormal; Notable for the following components:   Hemoglobin 11.8 (*)    HCT 36.4 (*)    MCV 71.5 (*)    MCH 23.2 (*)    All other components within normal limits  ETHANOL  URINE DRUG SCREEN, QUALITATIVE ([facility] ONLY)  BASIC METABOLIC PANEL   ____________________________________________  EKG  none  ____________________________________________  RADIOLOGY  none  ____________________________________________   PROCEDURES  Procedure(s) performed: None Procedures Critical Care performed:  None ____________________________________________   INITIAL IMPRESSION / ASSESSMENT AND  PLAN / ED COURSE  30 y.o. male with a history of schizophrenia, diabetes, hypertension who presents from his group home for concerns for his safety.  Patient seems to be paranoid but no SI or HI. Does not meet criteria for IVC. Will get meds for medical clearance. Will consult psychiatry. Old medical records reviewed.     _________________________ 3:55 AM on 06/23/2020 -----------------------------------------  Labs with mild hyponatremia. Will give a tab of NaCl and repeat labs later today. Otherwise medically cleared for psych eval.  The patient has been placed in psychiatric observation due to the need to provide a safe environment for the patient while obtaining psychiatric consultation and evaluation, as well as ongoing medical and medication management to treat the patient's condition.  The patient has not been placed under full IVC at this time.    Please note:  Patient was evaluated in Emergency Department today for the symptoms described in the history of present illness. Patient was evaluated in the context of the global COVID-19 pandemic,  which necessitated consideration that the patient might be at risk for infection with the SARS-CoV-2 virus that causes COVID-19. Institutional protocols and algorithms that pertain to the evaluation of patients at risk for COVID-19 are in a state of rapid change based on information released by regulatory bodies including the CDC and federal and state organizations. These policies and algorithms were followed during the patient's care in the ED.  Some ED evaluations and interventions may be delayed as a result of limited staffing during the pandemic.   ____________________________________________   FINAL CLINICAL IMPRESSION(S) / ED DIAGNOSES   Final diagnoses:  Schizophrenic disorder (HCC)  Hyponatremia      NEW MEDICATIONS STARTED DURING THIS VISIT:  ED Discharge Orders    None       Note:  This document was prepared using Dragon voice recognition software and may include unintentional dictation errors.    Nita Sickle, MD 06/23/20 [phone]

## 2020-06-23 NOTE — Consult Note (Signed)
[facility] Face-to-Face Psychiatry Consult   Reason for Consult: Consult for this 30 year old gentleman brought in after running away from his group home Referring Physician: Fuller Plan Patient Identification: [place] Gross[place] MRN:  [id number] Principal Diagnosis: Schizoaffective disorder, bipolar type (HCC) Diagnosis:  Principal Problem:   Schizoaffective disorder, bipolar type (HCC) Active Problems:   Tobacco use disorder   HTN (hypertension)   Dyslipidemia   Total Time spent with patient: 1 hour  Subjective:   John Copeland is a 30 y.o. male patient admitted with "I am sorry, I want to go back to the group home".  HPI: Patient seen chart reviewed.  30 year old man with a history of schizophrenia or schizoaffective disorder.  Apparently he just got out of [facility] about a week ago and was moved into this new group home.  Yesterday he got agitated and ran away.  He cannot tell me exactly what the problem was.  He says now he regrets doing it and he wants to go back to the group home.  Patient minimizes and denies symptoms.  Denies hallucinations.  Denies suicidal or homicidal thought.  Denies any physical symptoms.  Says that overall things have been okay but acknowledges that it stressful adjusting to a new group home.  Denies any thought of harming himself or anyone else.  Says he has been fully compliant with all of his prescribed medicine.  No acute complaints.  Past Psychiatric History: History of schizophrenia or schizoaffective disorder.  Past hospitalizations.  Past history of psychotic disorder agitation and disorganized behavior  Risk to Self:   Risk to Others:   Prior Inpatient Therapy:   Prior Outpatient Therapy:    Past Medical History:  Past Medical History:  Diagnosis Date  . Anxiety   . Depression   . Diabetes mellitus without complication (HCC)   . Homelessness   . Hypertension   . Schizophrenia (HCC)    History reviewed. No pertinent surgical  history. Family History:  Family History  Problem Relation Age of Onset  . Diabetes Other    Family Psychiatric  History: See previous Social History:  Social History   Substance and Sexual Activity  Alcohol Use No     Social History   Substance and Sexual Activity  Drug Use Yes  . Types: Marijuana   Comment: denies drug use    Social History   Socioeconomic History  . Marital status: Single    Spouse name: Not on file  . Number of children: Not on file  . Years of education: Not on file  . Highest education level: Not on file  Occupational History  . Not on file  Tobacco Use  . Smoking status: Former Smoker    Packs/day: 0.00    Types: Cigarettes  . Smokeless tobacco: Never Used  Vaping Use  . Vaping Use: Never used  Substance and Sexual Activity  . Alcohol use: No  . Drug use: Yes    Types: Marijuana    Comment: denies drug use  . Sexual activity: Not Currently  Other Topics Concern  . Not on file  Social History Narrative  . Not on file   Social Determinants of Health   Financial Resource Strain: Not on file  Food Insecurity: Not on file  Transportation Needs: Not on file  Physical Activity: Not on file  Stress: Not on file  Social Connections: Not on file   Additional Social History:    Allergies:   Allergies  Allergen Reactions  . Haldol [  Haloperidol Lactate] Other (See Comments)    Makes him feel slow    Labs:  Results for orders placed or performed during the hospital encounter of 06/23/20 (from the past 48 hour(s))  Comprehensive metabolic panel     Status: Abnormal   Collection Time: 06/23/20  2:11 AM  Result Value Ref Range   Sodium 126 (L) 135 - 145 mmol/L   Potassium 3.6 3.5 - 5.1 mmol/L   Chloride 91 (L) 98 - 111 mmol/L   CO2 24 22 - 32 mmol/L   Glucose, Bld 119 (H) 70 - 99 mg/dL    Comment: Glucose reference range applies only to samples taken after fasting for at least 8 hours.   BUN 6 6 - 20 mg/dL   Creatinine, Ser 0.62  0.61 - 1.24 mg/dL   Calcium 9.0 8.9 - 37.6 mg/dL   Total Protein 6.7 6.5 - 8.1 g/dL   Albumin 3.8 3.5 - 5.0 g/dL   AST 36 15 - 41 U/L   ALT 32 0 - 44 U/L   Alkaline Phosphatase 40 38 - 126 U/L   Total Bilirubin 0.8 0.3 - 1.2 mg/dL   GFR, Estimated >28 >31 mL/min    Comment: (NOTE) Calculated using the CKD-EPI Creatinine Equation (2021)    Anion gap 11 5 - 15    Comment: Performed at [facility], [address] [place], [place] [address]  Ethanol     Status: None   Collection Time: 06/23/20  2:11 AM  Result Value Ref Range   Alcohol, Ethyl (B) <10 <10 mg/dL    Comment: (NOTE) Lowest detectable limit for serum alcohol is 10 mg/dL.  For medical purposes only. Performed at [facility], [address] [place], [place] [address]   Salicylate level     Status: Abnormal   Collection Time: 06/23/20  2:11 AM  Result Value Ref Range   Salicylate Lvl <7.0 (L) 7.0 - 30.0 mg/dL    Comment: Performed at [facility], [address] [place], [place] [address]  Acetaminophen level     Status: Abnormal   Collection Time: 06/23/20  2:11 AM  Result Value Ref Range   Acetaminophen (Tylenol), Serum <10 (L) 10 - 30 ug/mL    Comment: (NOTE) Therapeutic concentrations vary significantly. A range of 10-30 ug/mL  may be an effective concentration for many patients. However, some  are best treated at concentrations outside of this range. Acetaminophen concentrations >150 ug/mL at 4 hours after ingestion  and >50 ug/mL at 12 hours after ingestion are often associated with  toxic reactions.  Performed at [facility], [address] [place], [place] [address]   cbc     Status: Abnormal   Collection Time: 06/23/20  2:11 AM  Result Value Ref Range   WBC 6.8 4.0 - 10.5 K/uL   RBC 5.09 4.22 - 5.81 MIL/uL   Hemoglobin 11.8 (L) 13.0 - 17.0 g/dL   HCT 62.7 (L) 03.5 - 00.9 %   MCV 71.5 (L) 80.0 - 100.0 fL   MCH 23.2 (L) 26.0 - 34.0 pg   MCHC 32.4 30.0 -  36.0 g/dL   RDW 38.1 82.9 - 93.7 %   Platelets 254 150 - 400 K/uL   nRBC 0.0 0.0 - 0.2 %    Comment: Performed at [facility], [address] [place], [place] [address]  Basic metabolic panel     Status: Abnormal   Collection Time: 06/23/20  9:23 AM  Result Value  Ref Range   Sodium 131 (L) 135 - 145 mmol/L   Potassium 4.1 3.5 - 5.1 mmol/L   Chloride 97 (L) 98 - 111 mmol/L   CO2 24 22 - 32 mmol/L   Glucose, Bld 94 70 - 99 mg/dL    Comment: Glucose reference range applies only to samples taken after fasting for at least 8 hours.   BUN 6 6 - 20 mg/dL   Creatinine, Ser 5.88 0.61 - 1.24 mg/dL   Calcium 9.1 8.9 - 32.5 mg/dL   GFR, Estimated >49 >82 mL/min    Comment: (NOTE) Calculated using the CKD-EPI Creatinine Equation (2021)    Anion gap 10 5 - 15    Comment: Performed at [facility], [address] [place], [place] [address]    No current facility-administered medications for this encounter.   Current Outpatient Medications  Medication Sig Dispense Refill  . amoxicillin (AMOXIL) 500 MG capsule Take 1 capsule (500 mg total) by mouth 3 (three) times daily. (Patient not taking: Reported on 07/04/2017) 21 capsule 0  . ARIPiprazole (ABILIFY) 15 MG tablet Take 1 tablet (15 mg total) by mouth daily for 14 days. For mood control 14 tablet 0  . atomoxetine (STRATTERA) 18 MG capsule Take 1 capsule (18 mg total) by mouth daily. For ADHD (Patient not taking: Reported on 06/26/2017) 30 capsule 0  . benzonatate (TESSALON) 100 MG capsule Take 1 capsule (100 mg total) by mouth 3 (three) times daily as needed for cough. (Patient not taking: Reported on 06/26/2017) 21 capsule 0  . divalproex (DEPAKOTE) 500 MG DR tablet Take 1 tablet (500 mg total) by mouth every 12 (twelve) hours for 14 days. For mood stabilization 28 tablet 0  . fluticasone (FLONASE) 50 MCG/ACT nasal spray Place 1 spray into both nostrils daily. (Patient not taking: Reported on 06/26/2017) 16 g 2  . ibuprofen  (ADVIL,MOTRIN) 800 MG tablet Take 1 tablet (800 mg total) by mouth 3 (three) times daily. (Patient not taking: Reported on 06/26/2017) 21 tablet 0  . pantoprazole (PROTONIX) 20 MG tablet Take 1 tablet (20 mg total) by mouth daily. 30 tablet 0  . pseudoephedrine (SUDAFED 12 HOUR) 120 MG 12 hr tablet Take 1 tablet (120 mg total) by mouth 2 (two) times daily. (Patient not taking: Reported on 07/12/2017) 4 tablet 0  . traZODone (DESYREL) 50 MG tablet Take 1 tablet (50 mg total) by mouth at bedtime as needed for sleep. (Patient not taking: Reported on 06/26/2017) 30 tablet 0    Musculoskeletal: Strength & Muscle Tone: within normal limits Gait & Station: normal Patient leans: N/A  Psychiatric Specialty Exam: Physical Exam Vitals and nursing note reviewed.  Constitutional:      Appearance: He is well-developed and well-nourished.  HENT:     Head: Normocephalic and atraumatic.  Eyes:     Conjunctiva/sclera: Conjunctivae normal.     Pupils: Pupils are equal, round, and reactive to light.  Cardiovascular:     Heart sounds: Normal heart sounds.  Pulmonary:     Effort: Pulmonary effort is normal.  Abdominal:     Palpations: Abdomen is soft.  Musculoskeletal:        General: Normal range of motion.     Cervical back: Normal range of motion.  Skin:    General: Skin is warm and dry.  Neurological:     General: No focal deficit present.     Mental Status: He is alert.  Psychiatric:        Mood and  Affect: Mood normal.        Thought Content: Thought content normal.        Judgment: Judgment normal.     Review of Systems  Constitutional: Negative.   HENT: Negative.   Eyes: Negative.   Respiratory: Negative.   Cardiovascular: Negative.   Gastrointestinal: Negative.   Musculoskeletal: Negative.   Skin: Negative.   Neurological: Negative.   Psychiatric/Behavioral: Negative.     Blood pressure (!) 154/89, pulse 81, temperature 97.8 F (36.6 C), temperature source Oral, resp. rate 16,  height 5\' 11"  (1.803 m), weight 86 kg, SpO2 99 %.Body mass index is 26.44 kg/m.  General Appearance: Fairly Groomed  Eye Contact:  Good  Speech:  Clear and Coherent  Volume:  Normal  Mood:  Euthymic  Affect:  Congruent  Thought Process:  Goal Directed  Orientation:  Full (Time, Place, and Person)  Thought Content:  Logical  Suicidal Thoughts:  No  Homicidal Thoughts:  No  Memory:  Immediate;   Fair Recent;   Fair Remote;   Fair  Judgement:  Fair  Insight:  Fair  Psychomotor Activity:  Normal  Concentration:  Concentration: Fair  Recall:  of Knowledge:  Fair  Language:  Fair  Akathisia:  No  Handed:  Right  AIMS (if indicated):     Assets:  Desire for Improvement Housing Physical Health  ADL's:  Intact  Cognition:  Impaired,  Mild  Sleep:        Treatment Plan Summary: Medication management and Plan Patient appears to be stable calm with no evidence of any dangerous behavior does not meet commitment criteria.  There is no indication that he needs inpatient hospitalization.  Supportive and encouraging therapy done with the patient.  Case reviewed with emergency room doctor and TTS.  Patient can be discharged from the emergency room to go back to the group home for follow-up mental health treatment as usual.  No new prescriptions required.  Disposition: No evidence of imminent risk to self or others at present.   Patient does not meet criteria for psychiatric inpatient admission. Supportive therapy provided about ongoing stressors. Discussed crisis plan, support from social network, calling 911, coming to the Emergency Department, and calling Suicide Hotline.  [employer], MD 06/23/2020 12:00 PM

## 2020-06-23 NOTE — Discharge Instructions (Addendum)
You are cleared to go back to your group home.  Your sodium was slightly low so try to eat foods high in salt and have this rechecked with your primary care doctor in 1 to 2 weeks.  Return to the ER for worsening confusion or any other concern

## 2020-06-23 NOTE — ED Notes (Signed)
Pt discharged back to group home in a cab.  VS stable. Discharge instructions given to patient.  All belongings returned to patient.

## 2020-06-23 NOTE — ED Notes (Signed)
VORB for behavioral med protocols by Dr. Don Perking given to this RN.  Pt refusing blood work at this time.

## 2020-06-23 NOTE — Progress Notes (Signed)
Staff from ED called per patient request. He was very welcoming and wanted me to pray that he gets accepted back into group home that he left on his own. He stated he keeps making stupid decisions. Patient shared some goals he would like to accomplish and asked that I pray with him those things would happen. I prayed and talked with patient prior to leaving him.

## 2020-06-23 NOTE — BH Assessment (Signed)
Writer made multiple attempts to contact patient's group home but was unable to reach anyone. Cell phone number [phone], didn't have a voicemail for writer to leave a message. Writer left a HIPPA compliant message on the Group Home voicemail requesting a return phone call [phone]).

## 2020-06-23 NOTE — Progress Notes (Signed)
   06/23/20 0515  Clinical Encounter Type  Visited With Patient  Visit Type Initial;Spiritual support;Social support;Psychological support  Referral From Nurse  Consult/Referral To Chaplain   Chaplain responded to page from nurse. The nurse stated John Copeland requested to speak to a chaplain. Chaplain engaged with PT to assess the needs of the pt. PT express his emotions, and asked the chaplain to pray with him, "so he would have positive thoughts". Chaplain prayed with PT and told him if he needed a chaplain again to notify the nurse.

## 2020-06-23 NOTE — ED Notes (Signed)
Patient requesting to see chaplin.

## 2020-06-23 NOTE — ED Notes (Signed)
This RN successfully notified legal guardian.

## 2020-06-23 NOTE — ED Triage Notes (Signed)
Pt's second time to ED tonight.  Stating to this RN this time that he ran away from his group home because he's scared to go to sleep there for fear of people messing with him while he sleeps.  Pt denies SI/HI or hallucinations.  Pt calm and cooperative in triage, wanting to see a doctor this time.  Group home is [facility].

## 2020-06-23 NOTE — ED Notes (Signed)
This RN was able to contact Leta Jungling at group home, [phone]. Leta Jungling verbalized understanding that patient would be coming back. Legal guardian updated on status as well. Leta Jungling informed this RN that they would not have transportation for patient until in the morning. Legal guardian verbalized it was okay for patient to ride in taxi back to group home. Leta Jungling, group home administrator also verbalizes patient is okay to go in taxi and a group home worker would be at the home when taxi drops patients off. Patient to wait in room until taxi arrives to implement safe discharge plans.

## 2020-06-23 NOTE — ED Provider Notes (Signed)
Patient is cleared by the psychiatric team to go back to his group home.   Concha Se, MD 06/23/20 [phone]

## 2020-06-23 NOTE — Consult Note (Signed)
[facility] Face-to-Face Psychiatry Consult   Reason for Consult:  Psych evaluation  Referring Physician:   Patient Identification: John Copeland MRN:  [id number] Principal Diagnosis: Schizoaffective disorder, bipolar type (HCC) Diagnosis:  Principal Problem:   Schizoaffective disorder, bipolar type (HCC)   Total Time spent with patient: 1 hour   HPI:  Per TTS, a 30 year old male presenting to [facility] ED voluntarily. Per triage note Pt's second time to ED tonight. Stating to this RN this time that he ran away from his group home because he's scared to go to sleep there for fear of people messing with him while he sleeps. Pt denies SI/HI or hallucinations. Pt calm and cooperative in triage, wanting to see a doctor this time. Group home is [facility].During assessment patient presents alert and oriented x4, calm and cooperative, mood is pleasant. When asked why patient is presenting to the ED he reports "I was at the group home, I'm not sure what happened." Patient does however report that he is taking his medications as prescribed and denies SI/HI/AH/VH and does not appear to be responding to any internal or external stimuli.  Recommendation:  Patient should be reassessed prior to discharge back to the grouphome. Patients medication should be re-evaluated and possibly adjusted due to paranoia onset.    After thorough evaluation and review of information currently presented on assessment of John Copeland, there is insufficient findings to indicate patient meets criteria for involuntary commitment or require an inpatient level of care. John Copeland denies suicidal/self-harm/homicidal ideation, and  Psychosis.   At this time he is not significantly impaired, psychotic, or manic on exam.   A detailed risk assessment has been completed based on clinical exam and individual risk factors.  Patient acute suicide risk is low.    Past Psychiatric History: Schizoaffective disorder  Risk to  Self:  no  Risk to Others:  no Prior Inpatient Therapy:   Prior Outpatient Therapy:    Past Medical History:  Past Medical History:  Diagnosis Date  . Anxiety   . Depression   . Diabetes mellitus without complication (HCC)   . Homelessness   . Hypertension   . Schizophrenia (HCC)    History reviewed. No pertinent surgical history. Family History:  Family History  Problem Relation Age of Onset  . Diabetes Other    Family Psychiatric  History: unknown Social History:  Social History   Substance and Sexual Activity  Alcohol Use No     Social History   Substance and Sexual Activity  Drug Use Yes  . Types: Marijuana   Comment: denies drug use    Social History   Socioeconomic History  . Marital status: Single    Spouse name: Not on file  . Number of children: Not on file  . Years of education: Not on file  . Highest education level: Not on file  Occupational History  . Not on file  Tobacco Use  . Smoking status: Former Smoker    Packs/day: 0.00    Types: Cigarettes  . Smokeless tobacco: Never Used  Vaping Use  . Vaping Use: Never used  Substance and Sexual Activity  . Alcohol use: No  . Drug use: Yes    Types: Marijuana    Comment: denies drug use  . Sexual activity: Not Currently  Other Topics Concern  . Not on file  Social History Narrative  . Not on file   Social Determinants of Health   Financial Resource Strain: Not  on file  Food Insecurity: Not on file  Transportation Needs: Not on file  Physical Activity: Not on file  Stress: Not on file  Social Connections: Not on file   Additional Social History:    Allergies:   Allergies  Allergen Reactions  . Haldol [Haloperidol Lactate] Other (See Comments)    Makes him feel slow    Labs:  Results for orders placed or performed during the hospital encounter of 06/23/20 (from the past 48 hour(s))  Comprehensive metabolic panel     Status: Abnormal   Collection Time: 06/23/20  2:11 AM  Result  Value Ref Range   Sodium 126 (L) 135 - 145 mmol/L   Potassium 3.6 3.5 - 5.1 mmol/L   Chloride 91 (L) 98 - 111 mmol/L   CO2 24 22 - 32 mmol/L   Glucose, Bld 119 (H) 70 - 99 mg/dL    Comment: Glucose reference range applies only to samples taken after fasting for at least 8 hours.   BUN 6 6 - 20 mg/dL   Creatinine, Ser 6.96 0.61 - 1.24 mg/dL   Calcium 9.0 8.9 - 29.5 mg/dL   Total Protein 6.7 6.5 - 8.1 g/dL   Albumin 3.8 3.5 - 5.0 g/dL   AST 36 15 - 41 U/L   ALT 32 0 - 44 U/L   Alkaline Phosphatase 40 38 - 126 U/L   Total Bilirubin 0.8 0.3 - 1.2 mg/dL   GFR, Estimated >28 >41 mL/min    Comment: (NOTE) Calculated using the CKD-EPI Creatinine Equation (2021)    Anion gap 11 5 - 15    Comment: Performed at [facility], [address] [place], [place] [address]  Ethanol     Status: None   Collection Time: 06/23/20  2:11 AM  Result Value Ref Range   Alcohol, Ethyl (B) <10 <10 mg/dL    Comment: (NOTE) Lowest detectable limit for serum alcohol is 10 mg/dL.  For medical purposes only. Performed at [facility], [address] [place], [place] [address]   Salicylate level     Status: Abnormal   Collection Time: 06/23/20  2:11 AM  Result Value Ref Range   Salicylate Lvl <7.0 (L) 7.0 - 30.0 mg/dL    Comment: Performed at [facility], [address] [place], [place] [address]  Acetaminophen level     Status: Abnormal   Collection Time: 06/23/20  2:11 AM  Result Value Ref Range   Acetaminophen (Tylenol), Serum <10 (L) 10 - 30 ug/mL    Comment: (NOTE) Therapeutic concentrations vary significantly. A range of 10-30 ug/mL  may be an effective concentration for many patients. However, some  are best treated at concentrations outside of this range. Acetaminophen concentrations >150 ug/mL at 4 hours after ingestion  and >50 ug/mL at 12 hours after ingestion are often associated with  toxic reactions.  Performed at [facility], [address] [place], [place] [address]   cbc     Status: Abnormal   Collection Time: 06/23/20  2:11 AM  Result Value Ref Range   WBC 6.8 4.0 - 10.5 K/uL   RBC 5.09 4.22 - 5.81 MIL/uL   Hemoglobin 11.8 (L) 13.0 - 17.0 g/dL   HCT 42.5 (L) 95.6 - 38.7 %   MCV 71.5 (L) 80.0 - 100.0 fL   MCH 23.2 (L) 26.0 - 34.0 pg   MCHC 32.4 30.0 - 36.0 g/dL   RDW 56.4 33.2 - 95.1 %   Platelets 254  150 - 400 K/uL   nRBC 0.0 0.0 - 0.2 %    Comment: Performed at [facility], [address] [place], [place] [address]    No current facility-administered medications for this encounter.   Current Outpatient Medications  Medication Sig Dispense Refill  . amoxicillin (AMOXIL) 500 MG capsule Take 1 capsule (500 mg total) by mouth 3 (three) times daily. (Patient not taking: Reported on 07/04/2017) 21 capsule 0  . ARIPiprazole (ABILIFY) 15 MG tablet Take 1 tablet (15 mg total) by mouth daily for 14 days. For mood control 14 tablet 0  . atomoxetine (STRATTERA) 18 MG capsule Take 1 capsule (18 mg total) by mouth daily. For ADHD (Patient not taking: Reported on 06/26/2017) 30 capsule 0  . benzonatate (TESSALON) 100 MG capsule Take 1 capsule (100 mg total) by mouth 3 (three) times daily as needed for cough. (Patient not taking: Reported on 06/26/2017) 21 capsule 0  . divalproex (DEPAKOTE) 500 MG DR tablet Take 1 tablet (500 mg total) by mouth every 12 (twelve) hours for 14 days. For mood stabilization 28 tablet 0  . fluticasone (FLONASE) 50 MCG/ACT nasal spray Place 1 spray into both nostrils daily. (Patient not taking: Reported on 06/26/2017) 16 g 2  . ibuprofen (ADVIL,MOTRIN) 800 MG tablet Take 1 tablet (800 mg total) by mouth 3 (three) times daily. (Patient not taking: Reported on 06/26/2017) 21 tablet 0  . pantoprazole (PROTONIX) 20 MG tablet Take 1 tablet (20 mg total) by mouth daily. 30 tablet 0  . pseudoephedrine (SUDAFED 12 HOUR) 120 MG 12 hr tablet Take 1 tablet (120 mg total) by mouth 2 (two) times daily. (Patient  not taking: Reported on 07/12/2017) 4 tablet 0  . traZODone (DESYREL) 50 MG tablet Take 1 tablet (50 mg total) by mouth at bedtime as needed for sleep. (Patient not taking: Reported on 06/26/2017) 30 tablet 0    Musculoskeletal: Strength & Muscle Tone: within normal limits Gait & Station: normal Patient leans: N/A  Psychiatric Specialty Exam: Physical Exam Vitals and nursing note reviewed.  HENT:     Head: Normocephalic and atraumatic.     Nose: Nose normal.  Eyes:     Pupils: Pupils are equal, round, and reactive to light.  Cardiovascular:     Rate and Rhythm: Normal rate.  Pulmonary:     Effort: Pulmonary effort is normal.  Musculoskeletal:        General: Normal range of motion.     Cervical back: Normal range of motion.  Skin:    General: Skin is dry.  Neurological:     General: No focal deficit present.     Mental Status: He is alert and oriented to person, place, and time.  Psychiatric:        Attention and Perception: Attention normal.        Mood and Affect: Mood is anxious. Affect is flat.        Speech: Speech is delayed.        Behavior: Behavior is cooperative.        Thought Content: Thought content is paranoid. Thought content does not include homicidal or suicidal ideation. Thought content does not include homicidal or suicidal plan.        Cognition and Memory: Cognition is impaired.        Judgment: Judgment is inappropriate.     Review of Systems  Psychiatric/Behavioral: Positive for behavioral problems, confusion and hallucinations.  All other systems reviewed and are negative.   Blood  pressure (!) 154/89, pulse 81, temperature 97.8 F (36.6 C), temperature source Oral, resp. rate 16, height 5\' 11"  (1.803 m), weight 86 kg, SpO2 99 %.Body mass index is 26.44 kg/m.  General Appearance: Guarded  Eye Contact:  Fair  Speech:  Slow  Volume:  Normal  Mood:  Anxious  Affect:  Congruent  Thought Process:  Coherent and Descriptions of Associations: Intact   Orientation:  Full (Time, Place, and Person)  Thought Content:  Logical  Suicidal Thoughts:  No  Homicidal Thoughts:  No  Memory:  Recent;   Fair  Judgement:  Fair  Insight:  Fair  Psychomotor Activity:  Normal  Concentration:  Attention Span: Fair  Recall:  [employer] of Knowledge:  Good  Language:  Fair  Akathisia:  NA  Handed:  Right  AIMS (if indicated):     Assets:  Housing Social Support  ADL's:  Intact  Cognition:  WNL  Sleep:       Disposition: No evidence of imminent risk to self or others at present.   Patient does not meet criteria for psychiatric inpatient admission. Discussed crisis plan, support from social network, calling 911, coming to the Emergency Department, and calling Suicide Hotline.  Jearld Lesch, NP 06/23/2020 5:17 AM

## 2020-06-23 NOTE — ED Notes (Signed)
This RN confirmed ride with [employer]

## 2020-07-07 ENCOUNTER — Encounter: Payer: Self-pay | Admitting: Emergency Medicine

## 2020-07-07 ENCOUNTER — Emergency Department
Admission: EM | Admit: 2020-07-07 | Discharge: 2020-07-08 | Disposition: A | Discharge status: Home / Self Care | Payer: No Typology Code available for payment source | Attending: Emergency Medicine | Admitting: Emergency Medicine

## 2020-07-07 ENCOUNTER — Other Ambulatory Visit: Payer: Self-pay

## 2020-07-07 DIAGNOSIS — Z20822 Contact with and (suspected) exposure to covid-19: Secondary | ICD-10-CM | POA: Diagnosis not present

## 2020-07-07 DIAGNOSIS — F250 Schizoaffective disorder, bipolar type: Secondary | ICD-10-CM | POA: Diagnosis present

## 2020-07-07 DIAGNOSIS — Z87891 Personal history of nicotine dependence: Secondary | ICD-10-CM | POA: Insufficient documentation

## 2020-07-07 DIAGNOSIS — F17200 Nicotine dependence, unspecified, uncomplicated: Secondary | ICD-10-CM | POA: Diagnosis present

## 2020-07-07 DIAGNOSIS — F209 Schizophrenia, unspecified: Secondary | ICD-10-CM | POA: Diagnosis present

## 2020-07-07 DIAGNOSIS — Z046 Encounter for general psychiatric examination, requested by authority: Secondary | ICD-10-CM | POA: Diagnosis present

## 2020-07-07 DIAGNOSIS — F200 Paranoid schizophrenia: Secondary | ICD-10-CM | POA: Diagnosis not present

## 2020-07-07 DIAGNOSIS — E119 Type 2 diabetes mellitus without complications: Secondary | ICD-10-CM | POA: Diagnosis not present

## 2020-07-07 DIAGNOSIS — F203 Undifferentiated schizophrenia: Secondary | ICD-10-CM | POA: Diagnosis not present

## 2020-07-07 DIAGNOSIS — I10 Essential (primary) hypertension: Secondary | ICD-10-CM | POA: Diagnosis not present

## 2020-07-07 LAB — COMPREHENSIVE METABOLIC PANEL
ALT: 24 U/L (ref 0–44)
AST: 29 U/L (ref 15–41)
Albumin: 4.1 g/dL (ref 3.5–5.0)
Alkaline Phosphatase: 41 U/L (ref 38–126)
Anion gap: 10 (ref 5–15)
BUN: 11 mg/dL (ref 6–20)
CO2: 26 mmol/L (ref 22–32)
Calcium: 9.5 mg/dL (ref 8.9–10.3)
Chloride: 101 mmol/L (ref 98–111)
Creatinine, Ser: 0.89 mg/dL (ref 0.61–1.24)
GFR, Estimated: >60 mL/min (ref >60)
Glucose, Bld: 99 mg/dL (ref 70–99)
Potassium: 3.8 mmol/L (ref 3.5–5.1)
Sodium: 137 mmol/L (ref 135–145)
Total Bilirubin: 0.6 mg/dL (ref 0.3–1.2)
Total Protein: 7 g/dL (ref 6.5–8.1)

## 2020-07-07 LAB — CBC
HCT: 38.7 % — ABNORMAL LOW (ref 39.0–52.0)
Hemoglobin: 12.5 g/dL — ABNORMAL LOW (ref 13.0–17.0)
MCH: 23.5 pg — ABNORMAL LOW (ref 26.0–34.0)
MCHC: 32.3 g/dL (ref 30.0–36.0)
MCV: 72.6 fL — ABNORMAL LOW (ref 80.0–100.0)
Platelets: 251 10*3/uL (ref 150–400)
RBC: 5.33 MIL/uL (ref 4.22–5.81)
RDW: 15.7 % — ABNORMAL HIGH (ref 11.5–15.5)
WBC: 7.7 10*3/uL (ref 4.0–10.5)
nRBC: 0 % (ref 0.0–0.2)

## 2020-07-07 LAB — ETHANOL: Alcohol, Ethyl (B): <10 mg/dL (ref <10)

## 2020-07-07 LAB — RESP PANEL BY RT-PCR (FLU A&B, COVID) ARPGX2
Influenza A by PCR: NEGATIVE
Influenza B by PCR: NEGATIVE
SARS Coronavirus 2 by RT PCR: NEGATIVE

## 2020-07-07 LAB — SALICYLATE LEVEL: Salicylate Lvl: <7 mg/dL — ABNORMAL LOW (ref 7.0–30.0)

## 2020-07-07 LAB — ACETAMINOPHEN LEVEL: Acetaminophen (Tylenol), Serum: <10 ug/mL — ABNORMAL LOW (ref 10–30)

## 2020-07-07 MED ORDER — PANTOPRAZOLE SODIUM 20 MG PO TBEC
20.0000 mg | DELAYED_RELEASE_TABLET | Freq: Every day | ORAL | Status: DC
  Administered 2020-07-08: 20 mg via ORAL
  Filled 2020-07-07: qty 1

## 2020-07-07 MED ORDER — DIVALPROEX SODIUM 500 MG PO DR TAB
500.0000 mg | DELAYED_RELEASE_TABLET | Freq: Two times a day (BID) | ORAL | Status: DC
  Administered 2020-07-08 (×2): 500 mg via ORAL
  Filled 2020-07-07 (×3): qty 1

## 2020-07-07 MED ORDER — TRAZODONE HCL 50 MG PO TABS: 50.0000 mg | ORAL_TABLET | Freq: Every evening | ORAL | Status: DC | PRN

## 2020-07-07 NOTE — ED Notes (Signed)
Personal Belongings:  Black phone w/ grey case Cologne (green bottle) Hair brush Green and black wallet w/ $3 Black pen Metal necklace (yellow in color) Grey pants White shoes Green plaid shirt  White underwear Black bible

## 2020-07-07 NOTE — ED Triage Notes (Signed)
Pt comes into the ED via [facility] where he was IVC'ed by [facility].  Pt currently lives in a group home and has been on-compliant with his medications and threatening harm to the group member staff.  Staff also reported that the patient has been acting like he is from a different time.  Pt currently denies any SI or HI and is calm and cooperative with RN in triage.

## 2020-07-07 NOTE — BH Assessment (Signed)
Comprehensive Clinical Assessment (CCA) Note  07/07/2020 John Copeland [id number] John Copeland is a 30 year old patient who presented to [facility] ED involuntarily from [facility]. According to the triage note, Pt comes into the ED via [facility] where he was IVC'ed by [facility].  Pt currently lives in a group home and has been non-compliant with his medications and threatening harm to the group member staff.  Staff also reported that the patient has been acting like he is from a different time.  Pt currently denies any SI or HI and is calm and cooperative with RN in triage. It is noted pt. was sleeping soundly upon this writer's arrival. Pt. was cooperative and oriented x3. Pt presented with a blunted affect. When asked what brought him to the hospital the pt. states "They sent me here; the Group Home called the ambulance; I was minding my own business". Pt had limited insight and judgement. Pt had slow speech. Pt's responses were loud and abrupt, with no intent of being aggressive. Pt's thought processes were relevant and coherent. Pt denied presenting any aggression in the group home setting. Pt admitted to not taking his medications due to a belief that they made him feel bad; however, pt. insisted that he'd be willing to take his medications appropriately now. When asked what services he felt were needed pt. expressed a desire to return to his group home. Pt denied SI, HI, and AV/H.    Flowsheet Row ED from 07/07/2020 in [facility] EMERGENCY DEPARTMENT ED from 06/23/2020 in [facility] EMERGENCY [facility]PART[facility] ED from 06/20/2020 in [facility]  C-SSRS RISK CATEGORY No Risk No Risk No Risk     The patient demonstrates the following risk factors for suicide: Chronic risk factors for suicide include: psychiatric disorder of Schizophrenia. Acute risk factors for suicide include: N/A. Protective factors for this patient include: hope for the  future. Considering these factors, the overall suicide risk at this point appears to be low. Patient is appropriate for outpatient follow up.  Recommendations for Services/Supports/Treatments: Disposition: Per psych NP Madaline Brilliant., pt. is psych cleared and can be discharged back to his group home.  Chief Complaint:  Chief Complaint  Patient presents with  . Psychiatric Evaluation   Visit Diagnosis: Schizophrenia    CCA Screening, Triage and Referral (STR)  Patient Reported Information How did you hear about [place]? Other (Comment)  Referral name: [facility]  Referral phone number: [phone]   Whom do you see for routine medical problems? I don't have a doctor  Practice/Facility Name: No data recorded Practice/Facility Phone Number: No data recorded Name of Contact: No data recorded Contact Number: No data recorded Contact Fax Number: No data recorded Prescriber Name: No data recorded Prescriber Address (if known): No data recorded  What Is the Reason for Your Visit/Call Today? Altered Mental Status  How Long Has This Been Causing You Problems? 1 wk - 1 month  What Do You Feel Would Help You the Most Today? Other (Comment) (Pt expressed a desire to return to his group home.)   Have You Recently Been in Any Inpatient Treatment (Hospital/Detox/Crisis Center/28-Day Program)? No  Name/Location of Program/Hospital:No data recorded How Long Were You There? No data recorded When Were You Discharged? No data recorded  Have You Ever Received Services From [facility] Before? No  Who Do You See at [facility]? No data recorded  Have You Recently Had Any Thoughts About Hurting Yourself? No  Are You Planning to  Commit Suicide/Harm Yourself At This time? No   Have you Recently Had Thoughts About Hurting Someone Karolee Ohs? No  Explanation: No data recorded  Have You Used Any Alcohol or Drugs in the Past 24 Hours? No  How Long Ago Did You Use Drugs or Alcohol? No data recorded What Did  You Use and How Much? No data recorded  Do You Currently Have a Therapist/Psychiatrist? Yes  Name of Therapist/Psychiatrist: Psychotherapeutic services   Have You Been Recently Discharged From Any Office Practice or Programs? No  Explanation of Discharge From Practice/Program: No data recorded    CCA Screening Triage Referral Assessment Type of Contact: Face-to-Face  Is this Initial or Reassessment? No data recorded Date Telepsych consult ordered in [facility]:  No data recorded Time Telepsych consult ordered in [facility]:  No data recorded  Patient Reported Information Reviewed? Yes  Patient Left Without Being Seen? No data recorded Reason for Not Completing Assessment: No data recorded  Collateral Involvement: No data recorded  Does Patient Have a Court Appointed Legal Guardian? No data recorded Name and Contact of Legal Guardian: No data recorded If Minor and Not Living with Parent(s), Who has Custody? No data recorded Is CPS involved or ever been involved? Never  Is APS involved or ever been involved? Never   Patient Determined To Be At Risk for Harm To Self or Others Based on Review of Patient Reported Information or Presenting Complaint? No  Method: No data recorded Availability of Means: No data recorded Intent: No data recorded Notification Required: No data recorded Additional Information for Danger to Others Potential: No data recorded Additional Comments for Danger to Others Potential: No data recorded Are There Guns or Other Weapons in Your Home? No data recorded Types of Guns/Weapons: No data recorded Are These Weapons Safely Secured?                            No data recorded Who Could Verify You Are Able To Have These Secured: No data recorded Do You Have any Outstanding Charges, Pending Court Dates, Parole/Probation? No data recorded Contacted To Inform of Risk of Harm To Self or Others: No data recorded  [facility] of Assessment: [facility] ED   Does Patient Present  under Involuntary Commitment? Yes  IVC Papers Initial File Date: 07/07/2020   [place] of Residence: [place]   Patient Currently Receiving the Following Services: Group Home; Medication Management   Determination of Need: Urgent (48 hours)   Options For Referral: Other: Comment (Pt is psych cleared)  DSM5 Diagnoses: Patient Active Problem List   Diagnosis Date Noted  . Left lower lobe pneumonia 06/26/2017  . Accidental drug overdose   . Schizophrenia (HCC) 03/29/2017  . 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 Diphenhydramine overdose of undetermined intent 01/10/2017  . Tobacco use disorder 10/07/2016  . Diabetes (HCC) 10/07/2016  . HTN (hypertension) 10/07/2016  . Dyslipidemia 10/07/2016  . Schizoaffective disorder, bipolar type (HCC) 11/30/2015   [name] R [place], LCAS

## 2020-07-07 NOTE — ED Notes (Signed)
IVC FROM  [facility]  PENDING  CONSULT 

## 2020-07-07 NOTE — BH Assessment (Signed)
Collateral Contact: This counselor attempted to contact pt's legal guardian [name] Lorayne Marek [phone]) however there was no answer. A HIPPA compliant voicemail was left, requesting a call back.

## 2020-07-07 NOTE — ED Provider Notes (Signed)
[facility] Emergency Department Provider Note  ____________________________________________   Event Date/Time   First MD Initiated Contact with Patient 07/07/20 1735     (approximate)  I have reviewed the triage vital signs and the nursing notes.   HISTORY  Chief Complaint Psychiatric Evaluation    HPI John Copeland is a 30 y.o. male with history of schizophrenia here with reported bizarre behavior.  According to IVC placed by his facility, the patient has been behaving bizarrely.  He has been occasionally possibly responding to internal stimuli.  Is been acting as though he is from the future.  He denies any of this on my assessment, though he is somewhat withdrawn.  Denies any self-harm or suicidal or homicidal ideation.  He states he feels okay at his current group home and would like to return there.  No other complaints.        Past Medical History:  Diagnosis Date  . Anxiety   . Depression   . Diabetes mellitus without complication (HCC)   . Homelessness   . Hypertension   . Schizophrenia [facility])     Patient Active Problem List   Diagnosis Date Noted  . Left lower lobe pneumonia 06/26/2017  . Accidental drug overdose   . Schizophrenia (HCC) 03/29/2017  . 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 Diphenhydramine overdose of undetermined intent 01/10/2017  . Tobacco use disorder 10/07/2016  . Diabetes (HCC) 10/07/2016  . HTN (hypertension) 10/07/2016  . Dyslipidemia 10/07/2016  . Schizoaffective disorder, bipolar type (HCC) 11/30/2015    History reviewed. No pertinent surgical history.  Prior to Admission medications   Medication Sig Start Date End Date Taking? Authorizing Provider  amoxicillin (AMOXIL) 500 MG capsule Take 1 capsule (500 mg total) by mouth 3 (three) times daily. Patient not taking: Reported on 07/04/2017 06/28/17   Palumbo, April, MD  ARIPiprazole (ABILIFY) 15 MG tablet Take 1 tablet (15 mg total) by mouth daily for 14 days. For  mood control 07/01/17 07/15/17  Long, Arlyss Repress, MD  atomoxetine (STRATTERA) 18 MG capsule Take 1 capsule (18 mg total) by mouth daily. For ADHD Patient not taking: Reported on 06/26/2017 06/25/17   Armandina Stammer I, NP  benzonatate (TESSALON) 100 MG capsule Take 1 capsule (100 mg total) by mouth 3 (three) times daily as needed for cough. Patient not taking: Reported on 06/26/2017 06/24/17   Petrucelli, Lelon Mast R, PA-C  divalproex (DEPAKOTE) 500 MG DR tablet Take 1 tablet (500 mg total) by mouth every 12 (twelve) hours for 14 days. For mood stabilization 07/01/17 07/15/17  Long, Arlyss Repress, MD  fluticasone (FLONASE) 50 MCG/ACT nasal spray Place 1 spray into both nostrils daily. Patient not taking: Reported on 06/26/2017 06/24/17   Petrucelli, Pleas Koch, PA-C  ibuprofen (ADVIL,MOTRIN) 800 MG tablet Take 1 tablet (800 mg total) by mouth 3 (three) times daily. Patient not taking: Reported on 06/26/2017 06/24/17   Petrucelli, Pleas Koch, PA-C  pantoprazole (PROTONIX) 20 MG tablet Take 1 tablet (20 mg total) by mouth daily. 07/15/17   Gilda Crease, MD  pseudoephedrine (SUDAFED 12 HOUR) 120 MG 12 hr tablet Take 1 tablet (120 mg total) by mouth 2 (two) times daily. Patient not taking: Reported on 07/12/2017 07/06/17   Melene Plan, DO  traZODone (DESYREL) 50 MG tablet Take 1 tablet (50 mg total) by mouth at bedtime as needed for sleep. Patient not taking: Reported on 06/26/2017 06/24/17   Sanjuana Kava, NP    Allergies Haldol [haloperidol lactate]  Family History  Problem Relation Age of Onset  . Diabetes Other     Social History Social History   Tobacco Use  . Smoking status: Former Smoker    Packs/day: 0.00    Types: Cigarettes  . Smokeless tobacco: Never Used  Vaping Use  . Vaping Use: Never used  Substance Use Topics  . Alcohol use: No  . Drug use: Yes    Types: Marijuana    Comment: denies drug use    Review of Systems  Review of Systems  Constitutional: Negative for chills and fever.   HENT: Negative for sore throat.   Respiratory: Negative for shortness of breath.   Cardiovascular: Negative for chest pain.  Gastrointestinal: Negative for abdominal pain.  Genitourinary: Negative for flank pain.  Musculoskeletal: Negative for neck pain.  Skin: Negative for rash and wound.  Allergic/Immunologic: Negative for immunocompromised state.  Neurological: Negative for weakness and numbness.  Hematological: Does not bruise/bleed easily.  Psychiatric/Behavioral: Positive for dysphoric mood.     ____________________________________________  PHYSICAL EXAM:      VITAL SIGNS: ED Triage Vitals  Enc Vitals Group     BP 07/07/20 1717 (!) 150/99     Pulse Rate 07/07/20 1717 88     Resp 07/07/20 1717 17     Temp 07/07/20 1717 98.3 F (36.8 C)     Temp Source 07/07/20 1717 Oral     SpO2 07/07/20 1717 100 %     Weight 07/07/20 1710 201 lb (91.2 kg)     Height 07/07/20 1710 5\' 11"  (1.803 m)     Head Circumference --      Peak Flow --      Pain Score 07/07/20 1710 4     Pain Loc --      Pain Edu? --      Excl. in GC? --      Physical Exam Vitals and nursing note reviewed.  Constitutional:      General: He is not in acute distress.    Appearance: He is well-developed and well-nourished.  HENT:     Head: Normocephalic and atraumatic.  Eyes:     Conjunctiva/sclera: Conjunctivae normal.  Cardiovascular:     Rate and Rhythm: Normal rate and regular rhythm.     Heart sounds: Normal heart sounds.  Pulmonary:     Effort: Pulmonary effort is normal. No respiratory distress.     Breath sounds: No wheezing.  Abdominal:     General: There is no distension.  Musculoskeletal:        General: No edema.     Cervical back: Neck supple.  Skin:    General: Skin is warm.     Capillary Refill: Capillary refill takes less than 2 seconds.     Findings: No rash.  Neurological:     Mental Status: He is alert and oriented to person, place, and time.     Motor: No abnormal muscle  tone.  Psychiatric:     Comments: Flat affect, withdrawn       ____________________________________________   LABS (all labs ordered are listed, but only abnormal results are displayed)  Labs Reviewed  SALICYLATE LEVEL - Abnormal; Notable for the following components:      Result Value   Salicylate Lvl <7.0 (*)    All other components within normal limits  ACETAMINOPHEN LEVEL - Abnormal; Notable for the following components:   Acetaminophen (Tylenol), Serum <10 (*)    All other components within normal limits  CBC - Abnormal; Notable for  the following components:   Hemoglobin 12.5 (*)    HCT 38.7 (*)    MCV 72.6 (*)    MCH 23.5 (*)    RDW 15.7 (*)    All other components within normal limits  RESP PANEL BY RT-PCR (FLU A&B, COVID) ARPGX2  COMPREHENSIVE METABOLIC PANEL  ETHANOL  URINE DRUG SCREEN, QUALITATIVE ([facility] ONLY)    ____________________________________________  EKG:  ________________________________________  RADIOLOGY All imaging, including plain films, CT scans, and ultrasounds, independently reviewed by me, and interpretations confirmed via formal radiology reads.  ED MD interpretation:     Official radiology report(s): No results found.  ____________________________________________  PROCEDURES   Procedure(s) performed (including Critical Care):  Procedures  ____________________________________________  INITIAL IMPRESSION / MDM / ASSESSMENT AND PLAN / ED COURSE  As part of my medical decision making, I reviewed the following data within the electronic MEDICAL RECORD NUMBER Nursing notes reviewed and incorporated, Old chart reviewed, Notes from prior ED visits, and  Controlled Substance Database       *Dat Derksen was evaluated in Emergency Department on 07/07/2020 for the symptoms described in the history of present illness. He was evaluated in the context of the global COVID-19 pandemic, which necessitated consideration that the patient  might be at risk for infection with the SARS-CoV-2 virus that causes COVID-19. Institutional protocols and algorithms that pertain to the evaluation of patients at risk for COVID-19 are in a state of rapid change based on information released by regulatory bodies including the CDC and federal and state organizations. These policies and algorithms were followed during the patient's care in the ED.  Some ED evaluations and interventions may be delayed as a result of limited staffing during the pandemic.*     Medical Decision Making: 30 year old male here under IVC for bizarre behavior.  Concern for decompensated schizophrenia per the facility, though appears somewhat organized and calm on my assessment.  Will have psychiatry to evaluate.  Lab work reviewed and is overall reassuring.  No apparent emergent medical pathology.  Home meds ordered.  The patient has been placed in psychiatric observation due to the need to provide a safe environment for the patient while obtaining psychiatric consultation and evaluation, as well as ongoing medical and medication management to treat the patient's condition.  The patient has been placed under full IVC at this time.   ____________________________________________  FINAL CLINICAL IMPRESSION(S) / ED DIAGNOSES  Final diagnoses:  Paranoid schizophrenia (HCC)     MEDICATIONS GIVEN DURING THIS VISIT:  Medications - No data to display   ED Discharge Orders    None       Note:  This document was prepared using Dragon voice recognition software and may include unintentional dictation errors.   Shaune Pollack, MD 07/07/20 2226

## 2020-07-07 NOTE — Consult Note (Signed)
[facility] Face-to-Face Psychiatry Consult   Reason for Consult: Psychiatric evaluation Referring Physician: Dr. Erma Heritage Patient Identification: John Copeland MRN:  [id number] Principal Diagnosis: <principal problem not specified> Diagnosis:  Active Problems:   Schizoaffective disorder, bipolar type (HCC)   Tobacco use disorder   Diabetes (HCC)   Schizophrenia (HCC)  Total Time spent with patient: 20 minutes  Subjective: " I want to go back home." John Copeland is a 30 y.o. male patient presented to [facility] ED for law enforcement under involuntary commitment status (IVC) by way of [facility].  Per [facility] intake the patient was committed by his ACTT team ([facility]).  This incident was due to the patient refusing to take his medications. The assessment states the patient was irritable and confrontational with male staff. The group home staff voiced safety concerns for the patient returning to his group home. It was reported that the patient continuously leaves his group home, and he is out in the neighborhood knocking on neighbors' doors. It was reported the patient continuously self-administered cold syrup and cold pills, which he does not have a prescription for. It is said the patient does have a history of addiction. The patient was seen face-to-face by this provider; the chart was reviewed and consulted with Dr. Erma Heritage on 07/07/2020 due to the patient's care. It was discussed with the EDP that the patient does not meet the criteria to be admitted to the psychiatric inpatient unit. The patient is not a safety risk to himself or others and does not require psychiatric inpatient admission for stabilization and treatment. On evaluation, the patient was seen resting but easily arousable. He is alert and oriented x 4, calm, cooperative, and mood-congruent with affect. The patient discloses he wants to return to his group home. The patient was made aware that he has to maintain medication  compliance. He verbalized understanding and will maintain medication compliance upon returning to his group home. The patient does not appear to be responding to internal or external stimuli. Neither is the patient presenting with any delusional thinking. The patient denies auditory or visual hallucinations. The patient denies any suicidal, homicidal, or self-harm ideations. The patient is not presenting with any psychotic or paranoid behaviors. During an encounter with the patient, he answered questions appropriately. Plan: The patient is not a safety risk to himself or others and does not require psychiatric inpatient admission for stabilization and treatment.  HPI:    Past Psychiatric History:   Risk to Self:  No Risk to Others:  No Prior Inpatient Therapy:   Yes Prior Outpatient Therapy:  Yes  Past Medical History:  Past Medical History:  Diagnosis Date  . Anxiety   . Depression   . Diabetes mellitus without complication (HCC)   . Homelessness   . Hypertension   . Schizophrenia (HCC)    History reviewed. No pertinent surgical history. Family History:  Family History  Problem Relation Age of Onset  . Diabetes Other    Family Psychiatric  History:  Social History:  Social History   Substance and Sexual Activity  Alcohol Use No     Social History   Substance and Sexual Activity  Drug Use Yes  . Types: Marijuana   Comment: denies drug use    Social History   Socioeconomic History  . Marital status: Single    Spouse name: Not on file  . Number of children: Not on file  . Years of education: Not on file  . Highest education level: Not  on file  Occupational History  . Not on file  Tobacco Use  . Smoking status: Former Smoker    Packs/day: 0.00    Types: Cigarettes  . Smokeless tobacco: Never Used  Vaping Use  . Vaping Use: Never used  Substance and Sexual Activity  . Alcohol use: No  . Drug use: Yes    Types: Marijuana    Comment: denies drug use  . Sexual  activity: Not Currently  Other Topics Concern  . Not on file  Social History Narrative  . Not on file   Social Determinants of Health   Financial Resource Strain: Not on file  Food Insecurity: Not on file  Transportation Needs: Not on file  Physical Activity: Not on file  Stress: Not on file  Social Connections: Not on file   Additional Social History:    Allergies:   Allergies  Allergen Reactions  . Haldol [Haloperidol Lactate] Other (See Comments)    Makes him feel slow    Labs:  Results for orders placed or performed during the hospital encounter of 07/07/20 (from the past 48 hour(s))  Comprehensive metabolic panel     Status: None   Collection Time: 07/07/20  5:17 PM  Result Value Ref Range   Sodium 137 135 - 145 mmol/L   Potassium 3.8 3.5 - 5.1 mmol/L   Chloride 101 98 - 111 mmol/L   CO2 26 22 - 32 mmol/L   Glucose, Bld 99 70 - 99 mg/dL    Comment: Glucose reference range applies only to samples taken after fasting for at least 8 hours.   BUN 11 6 - 20 mg/dL   Creatinine, Ser 1.61 0.61 - 1.24 mg/dL   Calcium 9.5 8.9 - 09.6 mg/dL   Total Protein 7.0 6.5 - 8.1 g/dL   Albumin 4.1 3.5 - 5.0 g/dL   AST 29 15 - 41 U/L   ALT 24 0 - 44 U/L   Alkaline Phosphatase 41 38 - 126 U/L   Total Bilirubin 0.6 0.3 - 1.2 mg/dL   GFR, Estimated >04 >54 mL/min    Comment: (NOTE) Calculated using the CKD-EPI Creatinine Equation (2021)    Anion gap 10 5 - 15    Comment: Performed at [facility], [address] [place], [place] [address]  Ethanol     Status: None   Collection Time: 07/07/20  5:17 PM  Result Value Ref Range   Alcohol, Ethyl (B) <10 <10 mg/dL    Comment: (NOTE) Lowest detectable limit for serum alcohol is 10 mg/dL.  For medical purposes only. Performed at [facility], [address] [place], [place] [address]   Salicylate level     Status: Abnormal   Collection Time: 07/07/20  5:17 PM  Result Value Ref Range   Salicylate Lvl <7.0 (L)  7.0 - 30.0 mg/dL    Comment: Performed at [facility], [address] [place], [place] [address]  Acetaminophen level     Status: Abnormal   Collection Time: 07/07/20  5:17 PM  Result Value Ref Range   Acetaminophen (Tylenol), Serum <10 (L) 10 - 30 ug/mL    Comment: (NOTE) Therapeutic concentrations vary significantly. A range of 10-30 ug/mL  may be an effective concentration for many patients. However, some  are best treated at concentrations outside of this range. Acetaminophen concentrations >150 ug/mL at 4 hours after ingestion  and >50 ug/mL at 12 hours after ingestion are often associated with  toxic reactions.  Performed at  [facility], [address] [place], [place] [address]   cbc     Status: Abnormal   Collection Time: 07/07/20  5:17 PM  Result Value Ref Range   WBC 7.7 4.0 - 10.5 K/uL   RBC 5.33 4.22 - 5.81 MIL/uL   Hemoglobin 12.5 (L) 13.0 - 17.0 g/dL   HCT 82.9 (L) 93.7 - 16.9 %   MCV 72.6 (L) 80.0 - 100.0 fL   MCH 23.5 (L) 26.0 - 34.0 pg   MCHC 32.3 30.0 - 36.0 g/dL   RDW 67.8 (H) 93.8 - 10.1 %   Platelets 251 150 - 400 K/uL   nRBC 0.0 0.0 - 0.2 %    Comment: Performed at [facility], [address] [place], [place] [address]  Resp Panel by RT-PCR (Flu A&B, Covid) Nasopharyngeal Swab     Status: None   Collection Time: 07/07/20  5:51 PM   Specimen: Nasopharyngeal Swab; Nasopharyngeal(NP) swabs in vial transport medium  Result Value Ref Range   SARS Coronavirus 2 by RT PCR NEGATIVE NEGATIVE    Comment: (NOTE) SARS-CoV-2 target nucleic acids are NOT DETECTED.  The SARS-CoV-2 RNA is generally detectable in upper respiratory specimens during the acute phase of infection. The lowest concentration of SARS-CoV-2 viral copies this assay can detect is 138 copies/mL. A negative result does not preclude SARS-Cov-2 infection and should not be used as the sole basis for treatment or other patient management decisions. A negative result  may occur with  improper specimen collection/handling, submission of specimen other than nasopharyngeal swab, presence of viral mutation(s) within the areas targeted by this assay, and inadequate number of viral copies(<138 copies/mL). A negative result must be combined with clinical observations, patient history, and epidemiological information. The expected result is Negative.  Fact Sheet for Patients:  BloggerCourse.com  Fact Sheet for Healthcare Providers:  SeriousBroker.it  This test is no t yet approved or cleared by the [place] FDA and  has been authorized for detection and/or diagnosis of SARS-CoV-2 by FDA under an Emergency Use Authorization (EUA). This EUA will remain  in effect (meaning this test can be used) for the duration of the COVID-19 declaration under Section 564(b)(1) of the Act, 21 U.S.C.section 360bbb-3(b)(1), unless the authorization is terminated  or revoked sooner.       Influenza A by PCR NEGATIVE NEGATIVE   Influenza B by PCR NEGATIVE NEGATIVE    Comment: (NOTE) The Xpert Xpress SARS-CoV-2/FLU/RSV plus assay is intended as an aid in the diagnosis of influenza from Nasopharyngeal swab specimens and should not be used as a sole basis for treatment. Nasal washings and aspirates are unacceptable for Xpert Xpress SARS-CoV-2/FLU/RSV testing.  Fact Sheet for Patients: BloggerCourse.com  Fact Sheet for Healthcare Providers: SeriousBroker.it  This test is not yet approved or cleared by the [place] FDA and has been authorized for detection and/or diagnosis of SARS-CoV-2 by FDA under an Emergency Use Authorization (EUA). This EUA will remain in effect (meaning this test can be used) for the duration of the COVID-19 declaration under Section 564(b)(1) of the Act, 21 U.S.C. section 360bbb-3(b)(1), unless the authorization is terminated  or revoked.  Performed at [facility], [address] [place], [place] [address]     No current facility-administered medications for this encounter.   Current Outpatient Medications  Medication Sig Dispense Refill  . amoxicillin (AMOXIL) 500 MG capsule Take 1 capsule (500 mg total) by mouth 3 (three) times daily. (Patient not taking: Reported on 07/04/2017)  21 capsule 0  . ARIPiprazole (ABILIFY) 15 MG tablet Take 1 tablet (15 mg total) by mouth daily for 14 days. For mood control 14 tablet 0  . atomoxetine (STRATTERA) 18 MG capsule Take 1 capsule (18 mg total) by mouth daily. For ADHD (Patient not taking: Reported on 06/26/2017) 30 capsule 0  . benzonatate (TESSALON) 100 MG capsule Take 1 capsule (100 mg total) by mouth 3 (three) times daily as needed for cough. (Patient not taking: Reported on 06/26/2017) 21 capsule 0  . divalproex (DEPAKOTE) 500 MG DR tablet Take 1 tablet (500 mg total) by mouth every 12 (twelve) hours for 14 days. For mood stabilization 28 tablet 0  . fluticasone (FLONASE) 50 MCG/ACT nasal spray Place 1 spray into both nostrils daily. (Patient not taking: Reported on 06/26/2017) 16 g 2  . ibuprofen (ADVIL,MOTRIN) 800 MG tablet Take 1 tablet (800 mg total) by mouth 3 (three) times daily. (Patient not taking: Reported on 06/26/2017) 21 tablet 0  . pantoprazole (PROTONIX) 20 MG tablet Take 1 tablet (20 mg total) by mouth daily. 30 tablet 0  . pseudoephedrine (SUDAFED 12 HOUR) 120 MG 12 hr tablet Take 1 tablet (120 mg total) by mouth 2 (two) times daily. (Patient not taking: Reported on 07/12/2017) 4 tablet 0  . traZODone (DESYREL) 50 MG tablet Take 1 tablet (50 mg total) by mouth at bedtime as needed for sleep. (Patient not taking: Reported on 06/26/2017) 30 tablet 0    Musculoskeletal: Strength & Muscle Tone: within normal limits Gait & Station: normal Patient leans: N/A  Psychiatric Specialty Exam: Physical Exam Constitutional:      Appearance: Normal  appearance.  HENT:     Right Ear: External ear normal.     Left Ear: External ear normal.     Nose: Nose normal.     Mouth/Throat:     Mouth: Mucous membranes are moist.  Cardiovascular:     Rate and Rhythm: Normal rate.     Pulses: Normal pulses.  Pulmonary:     Effort: Pulmonary effort is normal.  Musculoskeletal:        General: Normal range of motion.     Cervical back: Normal range of motion and neck supple.  Neurological:     General: No focal deficit present.     Mental Status: He is alert and oriented to person, place, and time.  Psychiatric:        Attention and Perception: Attention and perception normal.        Mood and Affect: Mood and affect normal.        Speech: Speech is delayed.        Behavior: Behavior is withdrawn. Behavior is cooperative.        Thought Content: Thought content normal.        Judgment: Judgment normal.     Review of Systems  All other systems reviewed and are negative.   Blood pressure (!) 150/99, pulse 88, temperature 98.3 F (36.8 C), temperature source Oral, resp. rate 17, height 5\' 11"  (1.803 m), weight 91.2 kg, SpO2 100 %.Body mass index is 28.03 kg/m.  General Appearance: Casual  Eye Contact:  Good  Speech:  Clear and Coherent  Volume:  Normal  Mood:  Euthymic  Affect:  Appropriate and Congruent  Thought Process:  Coherent and Goal Directed  Orientation:  Full (Time, Place, and Person)  Thought Content:  WDL and Logical  Suicidal Thoughts:  No  Homicidal Thoughts:  No  Memory:  Immediate;  Good Recent;   Good Remote;   Good  Judgement:  Good  Insight:  Fair  Psychomotor Activity:  Normal  Concentration:  Concentration: Good and Attention Span: Good  Recall:  Good  Fund of Knowledge:  Good  Language:  Good  Akathisia:  Negative  Handed:  Right  AIMS (if indicated):     Assets:  Communication Skills Desire for Improvement Physical Health Resilience Social Support  ADL's:  Intact  Cognition:  WNL  Sleep:    Well      Treatment Plan Summary: Medication management and Plan The patient is not a safety risk to himself or others and does not require psychiatric inpatient admission for stabilization and treatment.  Disposition: No evidence of imminent risk to self or others at present.   Patient does not meet criteria for psychiatric inpatient admission. Supportive therapy provided about ongoing stressors. Discussed crisis plan, support from social network, calling 911, coming to the Emergency Department, and calling Suicide Hotline.  Gillermo MurdochJacqueline Thompson, NP 07/07/2020 10:12 PM

## 2020-07-08 ENCOUNTER — Inpatient Hospital Stay
Admission: AD | Admit: 2020-07-08 | Discharge: 2020-07-20 | DRG: 885 | Disposition: A | Discharge status: Home / Self Care | Payer: No Typology Code available for payment source | Source: Intra-hospital | Attending: Behavioral Health | Admitting: Behavioral Health

## 2020-07-08 ENCOUNTER — Encounter: Payer: Self-pay | Admitting: Psychiatry

## 2020-07-08 DIAGNOSIS — Z888 Allergy status to other drugs, medicaments and biological substances status: Secondary | ICD-10-CM | POA: Diagnosis not present

## 2020-07-08 DIAGNOSIS — E119 Type 2 diabetes mellitus without complications: Secondary | ICD-10-CM | POA: Diagnosis present

## 2020-07-08 DIAGNOSIS — Z79899 Other long term (current) drug therapy: Secondary | ICD-10-CM

## 2020-07-08 DIAGNOSIS — F203 Undifferentiated schizophrenia: Secondary | ICD-10-CM

## 2020-07-08 DIAGNOSIS — K219 Gastro-esophageal reflux disease without esophagitis: Secondary | ICD-10-CM | POA: Diagnosis present

## 2020-07-08 DIAGNOSIS — F411 Generalized anxiety disorder: Secondary | ICD-10-CM | POA: Diagnosis present

## 2020-07-08 DIAGNOSIS — F32A Depression, unspecified: Secondary | ICD-10-CM | POA: Diagnosis present

## 2020-07-08 DIAGNOSIS — F17200 Nicotine dependence, unspecified, uncomplicated: Secondary | ICD-10-CM | POA: Diagnosis present

## 2020-07-08 DIAGNOSIS — Z7984 Long term (current) use of oral hypoglycemic drugs: Secondary | ICD-10-CM | POA: Diagnosis not present

## 2020-07-08 DIAGNOSIS — I10 Essential (primary) hypertension: Secondary | ICD-10-CM | POA: Diagnosis present

## 2020-07-08 DIAGNOSIS — E785 Hyperlipidemia, unspecified: Secondary | ICD-10-CM | POA: Diagnosis not present

## 2020-07-08 DIAGNOSIS — Z87891 Personal history of nicotine dependence: Secondary | ICD-10-CM

## 2020-07-08 DIAGNOSIS — F200 Paranoid schizophrenia: Secondary | ICD-10-CM | POA: Diagnosis not present

## 2020-07-08 DIAGNOSIS — Z9151 Personal history of suicidal behavior: Secondary | ICD-10-CM

## 2020-07-08 DIAGNOSIS — Z9114 Patient's other noncompliance with medication regimen: Secondary | ICD-10-CM

## 2020-07-08 DIAGNOSIS — F209 Schizophrenia, unspecified: Principal | ICD-10-CM | POA: Diagnosis present

## 2020-07-08 LAB — LIPID PANEL
Cholesterol: 177 mg/dL (ref 0–200)
Cholesterol: 178 mg/dL (ref 0–200)
HDL: 51 mg/dL (ref >40)
HDL: 52 mg/dL (ref >40)
LDL Cholesterol: 113 mg/dL — ABNORMAL HIGH (ref 0–99)
LDL Cholesterol: 112 mg/dL — ABNORMAL HIGH (ref 0–99)
Total CHOL/HDL Ratio: 3.5 RATIO
Total CHOL/HDL Ratio: 3.4 ratio
Triglycerides: 63 mg/dL (ref <150)
Triglycerides: 69 mg/dL (ref <150)
VLDL: 13 mg/dL (ref 0–40)
VLDL: 14 mg/dL (ref 0–40)

## 2020-07-08 LAB — URINE DRUG SCREEN, QUALITATIVE (ARMC ONLY)
Amphetamines, Ur Screen: NOT DETECTED
Barbiturates, Ur Screen: NOT DETECTED
Benzodiazepine, Ur Scrn: NOT DETECTED
Cannabinoid 50 Ng, Ur ~~LOC~~: NOT DETECTED
Cocaine Metabolite,Ur ~~LOC~~: NOT DETECTED
MDMA (Ecstasy)Ur Screen: NOT DETECTED
Methadone Scn, Ur: NOT DETECTED
Opiate, Ur Screen: NOT DETECTED
Phencyclidine (PCP) Ur S: NOT DETECTED
Tricyclic, Ur Screen: NOT DETECTED

## 2020-07-08 LAB — VALPROIC ACID LEVEL: Valproic Acid Lvl: 82 ug/mL (ref 50.0–100.0)

## 2020-07-08 LAB — HEMOGLOBIN A1C
Hgb A1c MFr Bld: 5.6 % (ref 4.8–5.6)
Mean Plasma Glucose: 114.02 mg/dL

## 2020-07-08 MED ORDER — ALUM & MAG HYDROXIDE-SIMETH 200-200-20 MG/5ML PO SUSP
30.0000 mL | ORAL | Status: DC | PRN
Start: 2020-07-08 — End: 2020-07-14
  Administered 2020-07-12 – 2020-07-13 (×3): 30 mL via ORAL
  Filled 2020-07-08 (×3): qty 30

## 2020-07-08 MED ORDER — AMLODIPINE BESYLATE 5 MG PO TABS
5.0000 mg | ORAL_TABLET | Freq: Every day | ORAL | Status: DC
  Administered 2020-07-08: 5 mg via ORAL
  Filled 2020-07-08: qty 1

## 2020-07-08 MED ORDER — HALOPERIDOL 5 MG PO TABS: 5.0000 mg | ORAL_TABLET | Freq: Two times a day (BID) | ORAL | Status: DC

## 2020-07-08 MED ORDER — ARIPIPRAZOLE 10 MG PO TABS
20.0000 mg | ORAL_TABLET | Freq: Every day | ORAL | Status: DC
  Administered 2020-07-09: 20 mg via ORAL
  Filled 2020-07-08: qty 2

## 2020-07-08 MED ORDER — TRAZODONE HCL 50 MG PO TABS: 50.0000 mg | ORAL_TABLET | Freq: Every evening | ORAL | Status: DC | PRN

## 2020-07-08 MED ORDER — DIVALPROEX SODIUM 500 MG PO DR TAB
500.0000 mg | DELAYED_RELEASE_TABLET | Freq: Two times a day (BID) | ORAL | Status: DC
  Administered 2020-07-08 – 2020-07-09 (×2): 500 mg via ORAL
  Filled 2020-07-08 (×2): qty 1

## 2020-07-08 MED ORDER — DOCUSATE SODIUM 100 MG PO CAPS
100.0000 mg | ORAL_CAPSULE | Freq: Two times a day (BID) | ORAL | Status: DC
  Administered 2020-07-08 – 2020-07-10 (×4): 100 mg via ORAL
  Filled 2020-07-08 (×4): qty 1

## 2020-07-08 MED ORDER — BENZTROPINE MESYLATE 1 MG PO TABS
0.5000 mg | ORAL_TABLET | Freq: Two times a day (BID) | ORAL | Status: DC
  Administered 2020-07-08: 0.5 mg via ORAL
  Filled 2020-07-08: qty 1

## 2020-07-08 MED ORDER — LORATADINE 10 MG PO TABS
10.0000 mg | ORAL_TABLET | Freq: Every day | ORAL | Status: DC
  Administered 2020-07-08: 10 mg via ORAL
  Filled 2020-07-08: qty 1

## 2020-07-08 MED ORDER — METFORMIN HCL 500 MG PO TABS
500.0000 mg | ORAL_TABLET | Freq: Two times a day (BID) | ORAL | Status: DC
  Administered 2020-07-08 – 2020-07-10 (×4): 500 mg via ORAL
  Filled 2020-07-08 (×4): qty 1

## 2020-07-08 MED ORDER — ACETAMINOPHEN 325 MG PO TABS
650.0000 mg | ORAL_TABLET | Freq: Four times a day (QID) | ORAL | Status: DC | PRN
  Administered 2020-07-12 – 2020-07-19 (×5): 650 mg via ORAL
  Filled 2020-07-08 (×5): qty 2

## 2020-07-08 MED ORDER — ARIPIPRAZOLE 10 MG PO TABS
20.0000 mg | ORAL_TABLET | Freq: Every day | ORAL | Status: DC
Start: 2020-07-08 — End: 2020-07-08
  Administered 2020-07-08: 20 mg via ORAL
  Filled 2020-07-08: qty 2

## 2020-07-08 MED ORDER — MAGNESIUM HYDROXIDE 400 MG/5ML PO SUSP: 30.0000 mL | Freq: Every day | ORAL | Status: DC | PRN

## 2020-07-08 MED ORDER — AMLODIPINE BESYLATE 5 MG PO TABS
5.0000 mg | ORAL_TABLET | Freq: Every day | ORAL | Status: DC
  Administered 2020-07-09 – 2020-07-20 (×12): 5 mg via ORAL
  Filled 2020-07-08 (×12): qty 1

## 2020-07-08 MED ORDER — HYDROXYZINE HCL 50 MG PO TABS: 50.0000 mg | ORAL_TABLET | Freq: Four times a day (QID) | ORAL | Status: DC | PRN

## 2020-07-08 MED ORDER — PANTOPRAZOLE SODIUM 20 MG PO TBEC
20.0000 mg | DELAYED_RELEASE_TABLET | Freq: Every day | ORAL | Status: DC
  Administered 2020-07-09 – 2020-07-20 (×12): 20 mg via ORAL
  Filled 2020-07-08 (×13): qty 1

## 2020-07-08 MED ORDER — HALOPERIDOL 5 MG PO TABS
5.0000 mg | ORAL_TABLET | Freq: Two times a day (BID) | ORAL | Status: DC
  Administered 2020-07-08 – 2020-07-09 (×2): 5 mg via ORAL
  Filled 2020-07-08 (×2): qty 1

## 2020-07-08 MED ORDER — LORATADINE 10 MG PO TABS
10.0000 mg | ORAL_TABLET | Freq: Every day | ORAL | Status: DC
  Administered 2020-07-09 – 2020-07-10 (×2): 10 mg via ORAL
  Filled 2020-07-08 (×3): qty 1

## 2020-07-08 MED ORDER — METFORMIN HCL 500 MG PO TABS
500.0000 mg | ORAL_TABLET | Freq: Two times a day (BID) | ORAL | Status: DC
  Administered 2020-07-08: 500 mg via ORAL
  Filled 2020-07-08: qty 1

## 2020-07-08 MED ORDER — DOCUSATE SODIUM 100 MG PO CAPS
100.0000 mg | ORAL_CAPSULE | Freq: Two times a day (BID) | ORAL | Status: DC
  Administered 2020-07-08: 100 mg via ORAL
  Filled 2020-07-08: qty 1

## 2020-07-08 MED ORDER — BENZTROPINE MESYLATE 1 MG PO TABS
0.5000 mg | ORAL_TABLET | Freq: Two times a day (BID) | ORAL | Status: DC
  Administered 2020-07-08 – 2020-07-09 (×2): 0.5 mg via ORAL
  Filled 2020-07-08 (×2): qty 1

## 2020-07-08 NOTE — Progress Notes (Signed)
Pt denies depression, anxiety, SI, HI and AVH. Pt denies using tobacco and ETOH, Pt staes that he never knew his dad and his mother died. He wants to go back to the group home. Pt stressors are being worried and "thinking people are messing with me". Pt would not answer the questions to the abuse assessment. Pt was oriented to the unit and verbalizes understanding. Torrie Mayers RN

## 2020-07-08 NOTE — ED Notes (Signed)
Lab called will add on lipid panel and A1c

## 2020-07-08 NOTE — ED Notes (Signed)
Hourly rounding completed at this time, patient currently asleep in room. No complaints, stable, and in no acute distress. Q15 minute rounds and monitoring via Security Cameras to continue. 

## 2020-07-08 NOTE — ED Notes (Signed)
Report given to receiving nurse Megan patient to be admitted to room 305 in lower level BHU

## 2020-07-08 NOTE — ED Notes (Signed)
Pt given breakfast tray and juice at this time. 

## 2020-07-08 NOTE — ED Notes (Addendum)
Patient given lunch and juice at this time.

## 2020-07-08 NOTE — ED Notes (Signed)
Pt out of shower at this time 

## 2020-07-08 NOTE — BH Assessment (Signed)
Writer returned the guardians phone call but was unable to reach him.  Writer left a HIPPA Compliant message with patient's guardian [name] [phone]), requesting a return phone call.

## 2020-07-08 NOTE — Plan of Care (Signed)
New admission  Problem: Education: Goal: Knowledge of [facility] Education information/materials will improve Outcome: Not Progressing Goal: Emotional status will improve Outcome: Not Progressing Goal: Mental status will improve Outcome: Not Progressing Goal: Verbalization of understanding the information provided will improve Outcome: Not Progressing   Problem: Activity: Goal: Interest or engagement in activities will improve Outcome: Not Progressing Goal: Sleeping patterns will improve Outcome: Not Progressing   Problem: Coping: Goal: Ability to verbalize frustrations and anger appropriately will improve Outcome: Not Progressing Goal: Ability to demonstrate self-control will improve Outcome: Not Progressing   Problem: Health Behavior/Discharge Planning: Goal: Identification of resources available to assist in meeting health care needs will improve Outcome: Not Progressing Goal: Compliance with treatment plan for underlying cause of condition will improve Outcome: Not Progressing   Problem: Physical Regulation: Goal: Ability to maintain clinical measurements within normal limits will improve Outcome: Not Progressing   Problem: Safety: Goal: Periods of time without injury will increase Outcome: Not Progressing

## 2020-07-08 NOTE — Progress Notes (Signed)
Phone call attempt to guardian failed, Torrie Mayers RN

## 2020-07-08 NOTE — Tx Team (Signed)
Initial Treatment Plan 07/08/2020 6:01 PM Hays Dunnigan JYN:[id number]    PATIENT STRESSORS: Medication change or noncompliance  Paranoia    PATIENT STRENGTHS: Communication skills Physical Health Supportive family/friends   PATIENT IDENTIFIED PROBLEMS: Risk for violence   Medication non compliance                    DISCHARGE CRITERIA:  Ability to meet basic life and health needs Improved stabilization in mood, thinking, and/or behavior Reduction of life-threatening or endangering symptoms to within safe limits Verbal commitment to aftercare and medication compliance  PRELIMINARY DISCHARGE PLAN: Outpatient therapy Placement in alternative living arrangements Return to previous living arrangement  PATIENT/FAMILY INVOLVEMENT: This treatment plan has been presented to and reviewed with the patient, John Copeland,  The patient has been given the opportunity to ask questions and make suggestions.  Chalmers Cater, RN 07/08/2020, 6:01 PM

## 2020-07-08 NOTE — Consult Note (Signed)
[facility]   Reason for Consult: Consult for 30 year old man with history of schizophrenia who comes to the emergency room with reports that he had been refusing medication acting strangely and agitated at his group home Referring Physician: Katrinka BlazingSmith Patient Identification: John MuffJoshua Omar Larcom MRN:  [id number] Principal Diagnosis: Schizophrenia [facility](HCC) Diagnosis:  Principal Problem:   Schizophrenia (HCC) Active Problems:   Schizoaffective disorder, bipolar type (HCC)   Tobacco use disorder   Diabetes (HCC)   HTN (hypertension)   Total Time spent with patient: 1 hour  Subjective:   John Copeland is a 30 y.o. male patient admitted with "I am afraid they will not let me go back".  HPI: Patient seen chart reviewed.  This is the most recent of several visits to the emergency room for this 30 year old man with a history of schizophrenia.  Current pattern is that at the group home they will report that he is refusing medications and has been agitated and possibly hospital.  Once he gets to the emergency room he looks sad and remorseful and begs for forgiveness and asks to go back to the group home saying he promises to be compliant with medicine.  On interview today the patient admits he had been refusing medication.  Cannot give any rational explanation for it.  Somewhat disorganized and paranoid in his thinking.  Admits that at times he has gotten into squabbles with people at the group home.  Denies any specific homicidal or suicidal ideation.  Reviewing his recent notes I am a little confused about what his medication profile is supposed to be.  I see a couple different antipsychotics listed not sure which one he has been getting most recently.  Patient seems to be bouncing back and forth from this group home which is a potential real problem since he just recently got out of a long-term hospitalization.  Denies any current substance abuse.  Past Psychiatric History:  Patient has a long-term history of mental health problems.  Multiple prior hospitalizations including lengthy state hospitalizations.  Seems to do fairly well when he is in a hospital structured setting and has been treated with mood stabilizers and antipsychotics but seems to frequently decompensate become noncompliant and argumentative when he is outside of a contained setting.  Distant history of suicide attempt nothing recent.  Vague about the amount of aggression he gets into does not appear to have a major history of violence.  No recent substance abuse nothing documented about problem substance abuse in the past.  Not clear to me whether he has ever been maintained on long-acting injectables.  On at least one occasion there was a mention of a plan to put him on long-acting Abilify but it does not look like that was necessarily done.  There is a mention of him being on Haldol Decanoate now but I am not sure that was ever actually given  Risk to Self:   Risk to Others:   Prior Inpatient Therapy:   Prior Outpatient Therapy:    Past Medical History:  Past Medical History:  Diagnosis Date  . Anxiety   . Depression   . Diabetes mellitus without complication (HCC)   . Homelessness   . Hypertension   . Schizophrenia (HCC)    History reviewed. No pertinent surgical history. Family History:  Family History  Problem Relation Age of Onset  . Diabetes Other    Family Psychiatric  History: None reported Social History:  Social History   Substance and Sexual  Activity  Alcohol Use No     Social History   Substance and Sexual Activity  Drug Use Yes  . Types: Marijuana   Comment: denies drug use    Social History   Socioeconomic History  . Marital status: Single    Spouse name: Not on file  . Number of children: Not on file  . Years of education: Not on file  . Highest education level: Not on file  Occupational History  . Not on file  Tobacco Use  . Smoking status: Former Smoker     Packs/day: 0.00    Types: Cigarettes  . Smokeless tobacco: Never Used  Vaping Use  . Vaping Use: Never used  Substance and Sexual Activity  . Alcohol use: No  . Drug use: Yes    Types: Marijuana    Comment: denies drug use  . Sexual activity: Not Currently  Other Topics Concern  . Not on file  Social History Narrative  . Not on file   Social Determinants of Health   Financial Resource Strain: Not on file  Food Insecurity: Not on file  Transportation Needs: Not on file  Physical Activity: Not on file  Stress: Not on file  Social Connections: Not on file   Additional Social History:    Allergies:   Allergies  Allergen Reactions  . Risperdal [Risperidone]     Labs:  Results for orders placed or performed during the hospital encounter of 07/07/20 (from the past 48 hour(s))  Comprehensive metabolic panel     Status: None   Collection Time: 07/07/20  5:17 PM  Result Value Ref Range   Sodium 137 135 - 145 mmol/L   Potassium 3.8 3.5 - 5.1 mmol/L   Chloride 101 98 - 111 mmol/L   CO2 26 22 - 32 mmol/L   Glucose, Bld 99 70 - 99 mg/dL    Comment: Glucose reference range applies only to samples taken after fasting for at least 8 hours.   BUN 11 6 - 20 mg/dL   Creatinine, Ser 4.69 0.61 - 1.24 mg/dL   Calcium 9.5 8.9 - 62.9 mg/dL   Total Protein 7.0 6.5 - 8.1 g/dL   Albumin 4.1 3.5 - 5.0 g/dL   AST 29 15 - 41 U/L   ALT 24 0 - 44 U/L   Alkaline Phosphatase 41 38 - 126 U/L   Total Bilirubin 0.6 0.3 - 1.2 mg/dL   GFR, Estimated >52 >84 mL/min    Comment: (NOTE) Calculated using the CKD-EPI Creatinine Equation (2021)    Anion gap 10 5 - 15    Comment: Performed at [facility], [address] [place], [place] [address]  Ethanol     Status: None   Collection Time: 07/07/20  5:17 PM  Result Value Ref Range   Alcohol, Ethyl (B) <10 <10 mg/dL    Comment: (NOTE) Lowest detectable limit for serum alcohol is 10 mg/dL.  For medical purposes only. Performed at  [facility], [address] [place], [place] [address]   Salicylate level     Status: Abnormal   Collection Time: 07/07/20  5:17 PM  Result Value Ref Range   Salicylate Lvl <7.0 (L) 7.0 - 30.0 mg/dL    Comment: Performed at [facility], [address] [place], [place] [address]  Acetaminophen level     Status: Abnormal   Collection Time: 07/07/20  5:17 PM  Result Value Ref Range   Acetaminophen (Tylenol), Serum <10 (L) 10 -  30 ug/mL    Comment: (NOTE) Therapeutic concentrations vary significantly. A range of 10-30 ug/mL  may be an effective concentration for many patients. However, some  are best treated at concentrations outside of this range. Acetaminophen concentrations >150 ug/mL at 4 hours after ingestion  and >50 ug/mL at 12 hours after ingestion are often associated with  toxic reactions.  Performed at [facility], [address] [place], [place] [address]   cbc     Status: Abnormal   Collection Time: 07/07/20  5:17 PM  Result Value Ref Range   WBC 7.7 4.0 - 10.5 K/uL   RBC 5.33 4.22 - 5.81 MIL/uL   Hemoglobin 12.5 (L) 13.0 - 17.0 g/dL   HCT 94.7 (L) 65.4 - 65.0 %   MCV 72.6 (L) 80.0 - 100.0 fL   MCH 23.5 (L) 26.0 - 34.0 pg   MCHC 32.3 30.0 - 36.0 g/dL   RDW 35.4 (H) 65.6 - 81.2 %   Platelets 251 150 - 400 K/uL   nRBC 0.0 0.0 - 0.2 %    Comment: Performed at [facility], [address] [place], [place] [address]  Resp Panel by RT-PCR (Flu A&B, Covid) Nasopharyngeal Swab     Status: None   Collection Time: 07/07/20  5:51 PM   Specimen: Nasopharyngeal Swab; Nasopharyngeal(NP) swabs in vial transport medium  Result Value Ref Range   SARS Coronavirus 2 by RT PCR NEGATIVE NEGATIVE    Comment: (NOTE) SARS-CoV-2 target nucleic acids are NOT DETECTED.  The SARS-CoV-2 RNA is generally detectable in upper respiratory specimens during the acute phase of infection. The lowest concentration of SARS-CoV-2 viral copies this assay  can detect is 138 copies/mL. A negative result does not preclude SARS-Cov-2 infection and should not be used as the sole basis for treatment or other patient management decisions. A negative result may occur with  improper specimen collection/handling, submission of specimen other than nasopharyngeal swab, presence of viral mutation(s) within the areas targeted by this assay, and inadequate number of viral copies(<138 copies/mL). A negative result must be combined with clinical observations, patient history, and epidemiological information. The expected result is Negative.  Fact Sheet for Patients:  BloggerCourse.com  Fact Sheet for Healthcare Providers:  SeriousBroker.it  This test is no t yet approved or cleared by the [place] FDA and  has been authorized for detection and/or diagnosis of SARS-CoV-2 by FDA under an Emergency Use Authorization (EUA). This EUA will remain  in effect (meaning this test can be used) for the duration of the COVID-19 declaration under Section 564(b)(1) of the Act, 21 U.S.C.section 360bbb-3(b)(1), unless the authorization is terminated  or revoked sooner.       Influenza A by PCR NEGATIVE NEGATIVE   Influenza B by PCR NEGATIVE NEGATIVE    Comment: (NOTE) The Xpert Xpress SARS-CoV-2/FLU/RSV plus assay is intended as an aid in the diagnosis of influenza from Nasopharyngeal swab specimens and should not be used as a sole basis for treatment. Nasal washings and aspirates are unacceptable for Xpert Xpress SARS-CoV-2/FLU/RSV testing.  Fact Sheet for Patients: BloggerCourse.com  Fact Sheet for Healthcare Providers: SeriousBroker.it  This test is not yet approved or cleared by the [place] FDA and has been authorized for detection and/or diagnosis of SARS-CoV-2 by FDA under an Emergency Use Authorization (EUA). This EUA will remain in effect  (meaning this test can be used) for the duration of the COVID-19 declaration under Section 564(b)(1) of the Act, 21 U.S.C. section 360bbb-3(b)(1), unless  the authorization is terminated or revoked.  Performed at [facility], [address] [place], [place] [address]   Urine Drug Screen, Qualitative     Status: None   Collection Time: 07/08/20 12:45 AM  Result Value Ref Range   Tricyclic, Ur Screen NONE DETECTED NONE DETECTED   Amphetamines, Ur Screen NONE DETECTED NONE DETECTED   MDMA (Ecstasy)Ur Screen NONE DETECTED NONE DETECTED   Cocaine Metabolite,Ur [place] NONE DETECTED NONE DETECTED   Opiate, Ur Screen NONE DETECTED NONE DETECTED   Phencyclidine (PCP) Ur S NONE DETECTED NONE DETECTED   Cannabinoid 50 Ng, Ur [place] NONE DETECTED NONE DETECTED   Barbiturates, Ur Screen NONE DETECTED NONE DETECTED   Benzodiazepine, Ur Scrn NONE DETECTED NONE DETECTED   Methadone Scn, Ur NONE DETECTED NONE DETECTED    Comment: (NOTE) Tricyclics + metabolites, urine    Cutoff 1000 ng/mL Amphetamines + metabolites, urine  Cutoff 1000 ng/mL MDMA (Ecstasy), urine              Cutoff 500 ng/mL Cocaine Metabolite, urine          Cutoff 300 ng/mL Opiate + metabolites, urine        Cutoff 300 ng/mL Phencyclidine (PCP), urine         Cutoff 25 ng/mL Cannabinoid, urine                 Cutoff 50 ng/mL Barbiturates + metabolites, urine  Cutoff 200 ng/mL Benzodiazepine, urine              Cutoff 200 ng/mL Methadone, urine                   Cutoff 300 ng/mL  The urine drug screen provides only a preliminary, unconfirmed analytical test result and should not be used for non-medical purposes. Clinical consideration and professional judgment should be applied to any positive drug screen result due to possible interfering substances. A more specific alternate chemical method must be used in order to obtain a confirmed analytical result. Gas chromatography / mass spectrometry (GC/MS) is the  preferred confirm atory method. Performed at [facility], [address] [place], [place] [address]     Current Facility-Administered Medications  Medication Dose Route Frequency Provider Last Rate Last Admin  . amLODipine (NORVASC) tablet 5 mg  5 mg Oral Daily Adrie Picking T, MD      . benztropine (COGENTIN) tablet 0.5 mg  0.5 mg Oral BID Yarenis Cerino T, MD      . divalproex (DEPAKOTE) DR tablet 500 mg  500 mg Oral Q12H Shaune Pollack, MD   500 mg at 07/08/20 6213  . docusate sodium (COLACE) capsule 100 mg  100 mg Oral BID Laird Runnion T, MD      . haloperidol (HALDOL) tablet 5 mg  5 mg Oral BID Mylisa Brunson T, MD      . loratadine (CLARITIN) tablet 10 mg  10 mg Oral Daily Hughey Rittenberry T, MD      . metFORMIN (GLUCOPHAGE) tablet 500 mg  500 mg Oral BID WC Eleanor Dimichele T, MD      . pantoprazole (PROTONIX) EC tablet 20 mg  20 mg Oral Daily Shaune Pollack, MD   20 mg at 07/08/20 0908  . traZODone (DESYREL) tablet 50 mg  50 mg Oral QHS PRN Shaune Pollack, MD       Current Outpatient Medications  Medication Sig Dispense Refill  . benztropine (COGENTIN) 1 MG tablet Take 1 mg by mouth  2 (two) times daily.    . sertraline (ZOLOFT) 50 MG tablet Take 50 mg by mouth daily.    . Cholecalciferol (VITAMIN D3) 25 MCG (1000 UT) CAPS Take 2,000 Units by mouth daily.    Marland Kitchen DEPAKOTE ER 250 MG 24 hr tablet Take 500 mg by mouth 2 (two) times daily.    Marland Kitchen docusate sodium (COLACE) 100 MG capsule Take 100 mg by mouth 2 (two) times daily.    . haloperidol decanoate (HALDOL DECANOATE) 50 MG/ML injection Inject 50 mg into the muscle every 28 (twenty-eight) days.    Marland Kitchen loratadine (CLARITIN) 10 MG tablet Take 10 mg by mouth every morning.    Marland Kitchen LORazepam (ATIVAN) 1 MG tablet Take 1 mg by mouth every 4 (four) hours as needed for anxiety.    . metFORMIN (GLUCOPHAGE) 500 MG tablet Take 500 mg by mouth 2 (two) times daily.    Marland Kitchen OLANZapine (ZYPREXA) 15 MG tablet Take 30 mg by mouth at bedtime.    .  pantoprazole (PROTONIX) 40 MG tablet Take 40 mg by mouth every morning.      Musculoskeletal: Strength & Muscle Tone: within normal limits Gait & Station: normal Patient leans: N/A  Psychiatric Specialty Exam: Physical Exam Vitals and nursing note reviewed.  Constitutional:      Appearance: He is well-developed and well-nourished.  HENT:     Head: Normocephalic and atraumatic.  Eyes:     Conjunctiva/sclera: Conjunctivae normal.     Pupils: Pupils are equal, round, and reactive to light.  Cardiovascular:     Heart sounds: Normal heart sounds.  Pulmonary:     Effort: Pulmonary effort is normal.  Abdominal:     Palpations: Abdomen is soft.  Musculoskeletal:        General: Normal range of motion.     Cervical back: Normal range of motion.  Skin:    General: Skin is warm and dry.  Neurological:     General: No focal deficit present.     Mental Status: He is alert.  Psychiatric:        Attention and Perception: He is inattentive.        Mood and Affect: Mood is depressed. Affect is blunt.        Speech: Speech is delayed.        Behavior: Behavior is slowed and withdrawn.        Thought Content: Thought content is paranoid. Thought content does not include homicidal or suicidal ideation.        Cognition and Memory: Cognition is impaired.        Judgment: Judgment is impulsive.     Review of Systems  Constitutional: Negative.   HENT: Negative.   Eyes: Negative.   Respiratory: Negative.   Cardiovascular: Negative.   Gastrointestinal: Negative.   Musculoskeletal: Negative.   Skin: Negative.   Neurological: Negative.   Psychiatric/Behavioral: Positive for confusion, dysphoric mood and hallucinations.    Blood pressure (!) 142/95, pulse 88, temperature 97.7 F (36.5 C), temperature source Oral, resp. rate 18, height 5\' 11"  (1.803 m), weight 91.2 kg, SpO2 99 %.Body mass index is 28.03 kg/m.  General Appearance: Casual  Eye Contact:  Fair  Speech:  Slow  Volume:   Decreased  Mood:  Depressed  Affect:  Congruent  Thought Process:  Coherent  Orientation:  Full (Time, Place, and Person)  Thought Content:  Rumination and Tangential  Suicidal Thoughts:  No  Homicidal Thoughts:  No  Memory:  Immediate;  Fair Recent;   Fair Remote;   Fair  Judgement:  Impaired  Insight:  Shallow  Psychomotor Activity:  Decreased  Concentration:  Concentration: Poor  Recall:  Poor  Fund of Knowledge:  Poor  Language:  Poor  Akathisia:  No  Handed:  Right  AIMS (if indicated):     Assets:  Desire for Improvement Housing Resilience Social Support  ADL's:  Impaired  Cognition:  Impaired,  Mild  Sleep:        Treatment Plan Summary: Medication management and Plan Multiple ER visits with evidence of problem behavior back at the group home.  We will go ahead and admit him to the psychiatric hospital for stabilization of psychosis.  I am going to put him on Depakote and Abilify with the hope that if he tolerates it and is doing well he can be put on a long-acting injectable prior to discharge.  Metformin and amlodipine for medical problems.  Labs reviewed.  Lipid panel ordered hemoglobin A1c ordered.  Patient can be admitted to the psychiatric unit on 15-minute checks.  Case reviewed with ER physician and TTS.  Disposition: Recommend psychiatric Inpatient admission when medically cleared. Supportive therapy provided about ongoing stressors.  Mordecai Rasmussen, MD 07/08/2020 2:08 PM

## 2020-07-08 NOTE — ED Provider Notes (Addendum)
Emergency Medicine Observation Re-evaluation Note  John Copeland is a 30 y.o. male, seen on rounds today.  Pt initially presented to the ED for complaints of Psychiatric Evaluation Currently, the patient is resting calmly, no acute complaints.  Physical Exam  BP (!) 142/82   Pulse 85   Temp 98 F (36.7 C) (Oral)   Resp 17   Ht 5\' 11"  (1.803 m)   Wt 91.2 kg   SpO2 97%   BMI 28.03 kg/m  Physical Exam General: no acute distress Cardiac: regular rate Lungs: equal chest rise Psych: calm  ED Course / MDM  EKG:    I have reviewed the labs performed to date as well as medications administered while in observation.  Recent changes in the last 24 hours include none.  Plan  Current plan is for inpatient admission Patient is under full IVC at this time.   , MD 07/08/20 1350    07/10/20, MD 07/08/20 1356

## 2020-07-08 NOTE — ED Notes (Signed)
Pt given shower supplies, in shower at this time. °

## 2020-07-08 NOTE — ED Notes (Signed)
Patient to nurse window requesting bible, soft cover bible provided

## 2020-07-08 NOTE — ED Notes (Signed)
Patient transferred from ED to [facility] room 1 after screening for contraband. Report received from [place], RN including Situation, Background, Assessment and Recommendations. Pt oriented to unit including Q15 minute rounds as well as the security cameras for their protection. Patient is alert and oriented, warm and dry in no acute distress. Patient denies SI, HI, and AVH. Pt. Encouraged to let this nurse know if needs arise.

## 2020-07-08 NOTE — ED Notes (Signed)
Patient sitting quietly on edge of bed. Dr. Toni Amend to consult this morning. Further plan of care pending.

## 2020-07-09 DIAGNOSIS — I10 Essential (primary) hypertension: Secondary | ICD-10-CM

## 2020-07-09 DIAGNOSIS — E119 Type 2 diabetes mellitus without complications: Secondary | ICD-10-CM

## 2020-07-09 MED ORDER — TRAZODONE HCL 50 MG PO TABS
50.0000 mg | ORAL_TABLET | Freq: Every evening | ORAL | Status: DC | PRN
  Administered 2020-07-09 (×2): 50 mg via ORAL
  Filled 2020-07-09 (×2): qty 1

## 2020-07-09 MED ORDER — ARIPIPRAZOLE 10 MG PO TABS
30.0000 mg | ORAL_TABLET | Freq: Every day | ORAL | Status: DC
  Administered 2020-07-10 – 2020-07-20 (×11): 30 mg via ORAL
  Filled 2020-07-09 (×11): qty 3

## 2020-07-09 MED ORDER — BENZTROPINE MESYLATE 1 MG PO TABS: 0.5000 mg | ORAL_TABLET | Freq: Two times a day (BID) | ORAL | Status: DC | PRN

## 2020-07-09 NOTE — [facility] Suicide Risk Assessment (Signed)
[facility]   Nursing information obtained from:  Patient Demographic factors:  Male,Low socioeconomic status,Unemployed Current Mental Status:  NA Loss Factors:  Loss of significant relationship Historical Factors:  NA Risk Reduction Factors:  NA  Total Time spent with patient: 50 minutes Principal Problem: Schizophrenia (HCC) Diagnosis:  Principal Problem:   Schizophrenia (HCC) Active Problems:   Diabetes (HCC)   HTN (hypertension)  Subjective Data: "Please do not remember back to that state hospital." [facility].  History of Present Illness: John Copeland is a 30 y.o. male with a history of schizophrenia who lives in a group home and has been having frequent visits to the emergency room for agitated behavior upon medication refusal. From initial psychiatric evaluation on 07/08/2020: This is the most recent of several visits to the emergency room for this 30 year old man with a history of schizophrenia. Current pattern is that at the group home they will report that he is refusing medications and has been agitated and possibly hospital. Once he gets to the emergency room he looks sad and remorseful and begs for forgiveness and asks to go back to the group home saying he promises to be compliant with medicine. On interview today the patient admits he had been refusing medication. Cannot give any rational explanation for it. Somewhat disorganized and paranoid in his thinking. Admits that at times he has gotten into squabbles with people at the group home. Denies any specific homicidal or suicidal ideation. Reviewing his recent notes I am a little confused about what his medication profile is supposed to be. I see a couple different antipsychotics listed not sure which one he has been getting most recently. Patient seems to be bouncing back and forth from this group home which is a potential real  problem since he just recently got out of a long-term hospitalization. Denies any current substance abuse.  On evaluation today, patient appears anxious.  He states, "I do not want to have to go back to that state hospital."  He states that he feels like he takes too much medicine.  He reports that he does not want to take so much medication, and would prefer for it to be able to be given at 1 time.  Will need to sit with patient to review his medications, many of which are for blood pressure and diabetes control.  Discussed with patient that we will work to make psychiatric medication dose at 1 time, and discussed use of long-term injectable antipsychotics, which he is agreeable to.  Patient reports that he will take medication at the group home as long as he does not have to go back to the state hospital.  He states that he used to be mean, and that is why he was at the state hospital.  He states that he is no longer meeting, but people at the state hospital are, and he does not want to be around them.  Reassured patient there was no plan to have him admitted to the state hospital.  I applauded his good behavior while on the unit, and encouraged him to continue to interact appropriately in the milieu, and attend group therapy (which she has done today).  Patient is denying any suicidal or homicidal ideation.  He is denying any auditory or visual hallucinations.   Associated Signs/Symptoms: Depression Symptoms:  Denies Duration of Depression Symptoms: No data recorded (Hypo) Manic Symptoms:  Distractibility, Irritable Mood, Lability of Mood, Anxiety Symptoms:  Excessive Worry, Psychotic Symptoms:  Paranoia, Duration of Psychotic Symptoms: No data recorded PTSD Symptoms: Denies abuse or neglect, however mother died when he was young and he was in foster care.  He dropped out of school in the eighth grade.    Past Psychiatric History: Patient has a long-term history of mental health problems.  Multiple prior hospitalizations including lengthy state hospitalizations. Seems to do fairly well when he is in a hospital structured setting and has been treated with mood stabilizers and antipsychotics but seems to frequently decompensate become noncompliant and argumentative when he is outside of a contained setting. Distant history of suicide attempt nothing recent. Vague about the amount of aggression he gets into does not appear to have a major history of violence. No recent substance abuse nothing documented about problem substance abuse in the past. Not clear to me whether he has ever been maintained on long-acting injectables. On at least one occasion there was a mention of a plan to put him on long-acting Abilify but it does not look like that was necessarily done. There is a mention of him being on Haldol Decanoate now but I am not sure that was ever actually given  Is the patient at risk to self? No.  Has the patient been a risk to self in the past 6 months? No.  Has the patient been a risk to self within the distant past? Yes.    Is the patient a risk to others? Yes.    Has the patient been a risk to others in the past 6 months? No.  Has the patient been a risk to others within the distant past? Yes.     Prior Inpatient Therapy:  Yes  Prior Outpatient Therapy:  Yes  Continued Clinical Symptoms:  Alcohol Use Disorder Identification Test Final Score (AUDIT): 0 The "Alcohol Use Disorders Identification Test", Guidelines for Use in Primary Care, Second Edition.  World Science writer [facility]). Score between 0-7:  no or low risk or alcohol related problems. Score between 8-15:  moderate risk of alcohol related problems. Score between 16-19:  high risk of alcohol related problems. Score 20 or above:  warrants further diagnostic evaluation for alcohol dependence and treatment.   CLINICAL FACTORS:   Severe Anxiety and/or Agitation Schizophrenia:   Depressive  state    Musculoskeletal: Strength & Muscle Tone: within normal limits Gait & Station: normal Patient leans: N/A  Psychiatric Specialty Exam: Physical Exam Vitals and nursing note reviewed.  Constitutional:      Appearance: Normal appearance.  HENT:     Head: Normocephalic and atraumatic.  Eyes:     Extraocular Movements: Extraocular movements intact.  Cardiovascular:     Rate and Rhythm: Normal rate.  Pulmonary:     Effort: Pulmonary effort is normal. No respiratory distress.  Musculoskeletal:        General: Normal range of motion.     Cervical back: Normal range of motion.  Neurological:     General: No focal deficit present.     Mental Status: He is alert and oriented to person, place, and time.     Review of Systems  Constitutional: Negative.   Respiratory: Negative.   Cardiovascular: Negative.   Gastrointestinal: Negative.   Neurological: Negative.   Psychiatric/Behavioral: Positive for decreased concentration and dysphoric mood. Negative for agitation, behavioral problems, confusion, hallucinations, self-injury, sleep disturbance and suicidal ideas. The patient is nervous/anxious. The patient is not hyperactive.     Blood pressure 126/76, pulse 88, temperature 97.9  F (36.6 C), temperature source Oral, resp. rate 18, height 6' 0.05" (1.83 m), weight 88 kg, SpO2 100 %.Body mass index is 26.28 kg/m.  General Appearance: Casual  Eye Contact:  Fair  Speech:  Clear and Coherent and Normal Rate  Volume:  Decreased  Mood:  Anxious and Dysphoric  Affect:  Congruent  Thought Process:  Goal Directed and Descriptions of Associations: Circumstantial  Orientation:  Full (Time, Place, and Person)  Thought Content:  Hallucinations: Denies  Suicidal Thoughts:  No  Homicidal Thoughts:  No  Memory:  Immediate;   Fair Recent;   Fair Remote;   Fair  Judgement:  Poor  Insight:  Shallow  Psychomotor Activity:  Restlessness  Concentration:  Concentration: Fair  Recall:   [employer] of Knowledge:  Fair  Language:  Good  Akathisia:  No  Handed:  Right  AIMS (if indicated):     Assets:  Communication Skills Desire for Improvement Financial Resources/Insurance Housing Resilience  ADL's:  Intact  Cognition:  WNL  Sleep:  Number of Hours: 8    COGNITIVE FEATURES THAT CONTRIBUTE TO RISK:  Polarized thinking and Thought constriction (tunnel vision)    SUICIDE RISK:   Minimal: No identifiable suicidal ideation.  Patients presenting with no risk factors but with morbid ruminations; may be classified as minimal risk based on the severity of the depressive symptoms  PLAN OF CARE:  Treatment Plan Summary: Daily contact with patient to assess and evaluate symptoms and progress in treatment and Medication management  Observation Level/Precautions:  15 minute checks  Laboratory:  Labs reviewed from emergency department with valproic acid level in normal range, slight elevation to LDL, otherwise unremarkable.  Psychotherapy: Encourage patient to be active in the milieu and attend group therapy  Medications: We will attempt to decrease frequency of medications: Changed to Depakote ER 1000 mg at night; increase Abilify to 30 mg every morning; discontinue Haldol; change Cogentin 0.5 mg twice daily to as needed; trazodone 50 mg at bedtime and may repeat once as needed for sleep Continue for medical needs: Norvasc 5 mg daily for hypertension; Colace 100 mg twice daily for constipation; hydroxyzine 50 mg every 6 hours as needed for anxiety; Claritin 10 mg daily for allergies; Metformin 500 mg twice daily with meals for diabetes; Protonix 20 mg daily for reflux.  Consultations: Hospitalist if indicated  Discharge Concerns: Medication compliance  Estimated LOS: 3-7 days  Other: Transition to long-acting injectable Abilify Maintena if patient does well with Abilify as single agent antipsychotic for mood stabilization and schizophrenia.   Physician Treatment Plan for  Primary Diagnosis: Schizophrenia (HCC) Long Term Goal(s): Improvement in symptoms so as ready for discharge  Short Term Goals: Ability to identify changes in lifestyle to reduce recurrence of condition will improve, Ability to verbalize feelings will improve, Ability to disclose and discuss suicidal ideas, Ability to demonstrate self-control will improve, Ability to identify and develop effective coping behaviors will improve, Ability to maintain clinical measurements within normal limits will improve and Compliance with prescribed medications will improve  Physician Treatment Plan for Secondary Diagnosis: Principal Problem:   Schizophrenia (HCC) Active Problems:   Diabetes (HCC)   HTN (hypertension)  Long Term Goal(s): Improvement in symptoms so as ready for discharge  Short Term Goals: Ability to identify changes in lifestyle to reduce recurrence of condition will improve, Ability to verbalize feelings will improve, Ability to disclose and discuss suicidal ideas, Ability to demonstrate self-control will improve, Ability to identify and develop effective coping  behaviors will improve, Ability to maintain clinical measurements within normal limits will improve and Compliance with prescribed medications will improve   I certify that inpatient services furnished can reasonably be expected to improve the patient's condition.   Mariel Craft, MD 07/09/2020, 4:35 PM

## 2020-07-09 NOTE — [facility] Counselor (Signed)
Adult Comprehensive Assessment  Patient ID: John Copeland, male   DOB: 1991/02/08, 29 y.o.   MRN: [id number]  Information Source: Information source: Patient  Current Stressors:  Patient states their primary concerns and needs for treatment are:: They offered me some medicine at the group home and I refused to take it Patient states their goals for this hospitilization and ongoing recovery are:: I want to go back home to the group home Educational / Learning stressors: No Employment / Job issues: No Family Relationships: Close to my sister, uncle Surveyor, quantity / Lack of resources (include bankruptcy): Disability check Housing / Lack of housing: Group home Physical health (include injuries & life threatening diseases): No Social relationships: No Substance abuse: Cough medicine  Living/Environment/Situation:  Living Arrangements: Group Home Living conditions (as described by patient or guardian): Like the group home Who else lives in the home?: Multiple residents How long has patient lived in current situation?: 1 month What is atmosphere in current home: Comfortable  Family History:  Marital status: Single Are you sexually active?: No Has your sexual activity been affected by drugs, alcohol, medication, or emotional stress?: No Does patient have children?: No  Childhood History:  By whom was/is the patient raised?: Mother Additional childhood history information: Mother passed away until 82. Aunt took care of me for awhile then I went to foster care. never knew my father Description of patient's relationship with caregiver when they were a child: [place] of a good relationship with mom and aunt Patient's description of current relationship with people who raised him/her: Mother deceased How were you disciplined when you got in trouble as a child/adolescent?: Sometimes I got whoopings Does patient have siblings?: Yes Number of Siblings: 4 (2 brothers and 2 sisters) Description of  patient's current relationship with siblings: 2 sisters in [place], brother that lives in [place] in [place] the other one died. Brother died when I was 43 Did patient suffer any verbal/emotional/physical/sexual abuse as a child?: No Did patient suffer from severe childhood neglect?: No Has patient ever been sexually abused/assaulted/raped as an adolescent or adult?: No Was the patient ever a victim of a crime or a disaster?: No Witnessed domestic violence?: No Has patient been affected by domestic violence as an adult?: No  Education:  Highest grade of school patient has completed: 8th grade Currently a student?: No Learning disability?: No  Employment/Work Situation:   Employment situation: On disability Why is patient on disability: Mental Illness How long has patient been on disability: Since 30 years old Patient's job has been impacted by current illness: No What is the longest time patient has a held a job?: I don't really remember Where was the patient employed at that time?: I don't really remember Has patient ever been in the [employer]?: No  Financial Resources:   Financial resources: Sports coach Does patient have a Lawyer or guardian?: Yes Name of representative payee or guardian: Careers information officer  Alcohol/Substance Abuse:   What has been your use of drugs/alcohol within the last 12 months?: I use to spoke marijuana and drink alcohol If attempted suicide, did drugs/alcohol play a role in this?: No Alcohol/Substance Abuse Treatment Hx: Past Tx, Inpatient If yes, describe treatment: 30 years old Has alcohol/substance abuse ever caused legal problems?: No  Social Support System:   Forensic psychologist System:  (It's okay) Describe Community Support System: Patient stated its okay Type of faith/religion: Ephriam Knuckles How does patient's faith help to cope with current illness?: Yes  Leisure/Recreation:   Do  You Have Hobbies?: Yes Leisure and Hobbies: I  like going to church  Strengths/Needs:   What is the patient's perception of their strengths?: Praying Patient states they can use these personal strengths during their treatment to contribute to their recovery: Yes Patient states these barriers may affect/interfere with their treatment: No Patient states these barriers may affect their return to the community: No Other important information patient would like considered in planning for their treatment: I would like to go back to the group home  Discharge Plan:   Currently receiving community mental health services: Yes (From Whom) ([facility] team) Patient states concerns and preferences for aftercare planning are: Patient would like to go back to the group home Patient states they will know when they are safe and ready for discharge when: I'm ready to leave today Does patient have access to transportation?: Yes Does patient have financial barriers related to discharge medications?: No Patient description of barriers related to discharge medications: No Will patient be returning to same living situation after discharge?:  (Unsure)  Summary/Recommendations:   Summary and Recommendations (to be completed by the evaluator): Patient is a 30 year old male who present to [facility] ED under IVC. Patient came to the hospital due to being non-complaint at his group home and not taking his medications. Patient stated that he got mad at the group home staff but could not say why he got mad. Patient stated his goal is to go back to the group home. Patient stated that he does not want to go back to the stated hospital. Patient denies any SI/AH/HI. Patient will benefit from crisis stabilization, medication evaluation, group therapy and psychoeducation, in addition to case management for discharge planning. At discharge it is recommended that Patient adhere to the established discharge plan and continue in treatment.  Susa Simmonds. 07/09/2020

## 2020-07-09 NOTE — H&P (Signed)
Psychiatric Admission Assessment Adult  Patient Identification: John Copeland MRN:  [id number] Date of Evaluation:  07/09/2020 Chief Complaint:  Schizophrenia (HCC) [F20.9] Principal Diagnosis: Schizophrenia (HCC) Diagnosis:  Principal Problem:   Schizophrenia (HCC) Active Problems:   Diabetes (HCC)   HTN (hypertension)  History of Present Illness: John Copeland is a 30 y.o. male with a history of schizophrenia who lives in a group home and has been having frequent visits to the emergency room for agitated behavior upon medication refusal. From initial psychiatric evaluation on 07/08/2020: This is the most recent of several visits to the emergency room for this 30 year old man with a history of schizophrenia.  Current pattern is that at the group home they will report that he is refusing medications and has been agitated and possibly hospital.  Once he gets to the emergency room he looks sad and remorseful and begs for forgiveness and asks to go back to the group home saying he promises to be compliant with medicine.  On interview today the patient admits he had been refusing medication.  Cannot give any rational explanation for it.  Somewhat disorganized and paranoid in his thinking.  Admits that at times he has gotten into squabbles with people at the group home.  Denies any specific homicidal or suicidal ideation.  Reviewing his recent notes I am a little confused about what his medication profile is supposed to be.  I see a couple different antipsychotics listed not sure which one he has been getting most recently.  Patient seems to be bouncing back and forth from this group home which is a potential real problem since he just recently got out of a long-term hospitalization.  Denies any current substance abuse.  On evaluation today, patient appears anxious.  He states, "I do not want to have to go back to that state hospital."  He states that he feels like he takes too much medicine.   He reports that he does not want to take so much medication, and would prefer for it to be able to be given at 1 time.  Will need to sit with patient to review his medications, many of which are for blood pressure and diabetes control.  Discussed with patient that we will work to make psychiatric medication dose at 1 time, and discussed use of long-term injectable antipsychotics, which he is agreeable to.  Patient reports that he will take medication at the group home as long as he does not have to go back to the state hospital.  He states that he used to be mean, and that is why he was at the state hospital.  He states that he is no longer meeting, but people at the state hospital are, and he does not want to be around them.  Reassured patient there was no plan to have him admitted to the state hospital.  I applauded his good behavior while on the unit, and encouraged him to continue to interact appropriately in the milieu, and attend group therapy (which she has done today).  Patient is denying any suicidal or homicidal ideation.  He is denying any auditory or visual hallucinations.   Associated Signs/Symptoms: Depression Symptoms:  Denies Duration of Depression Symptoms: No data recorded (Hypo) Manic Symptoms:  Distractibility, Irritable Mood, Labiality of Mood, Anxiety Symptoms:  Excessive Worry, Psychotic Symptoms:  Paranoia, Duration of Psychotic Symptoms: No data recorded PTSD Symptoms: Denies abuse or neglect, however mother died when he was young and he was in foster care.  He dropped out of school in the eighth grade.   Total Time spent with patient: 50 minutes  Past Psychiatric History:  Patient has a long-term history of mental health problems.  Multiple prior hospitalizations including lengthy state hospitalizations.  Seems to do fairly well when he is in a hospital structured setting and has been treated with mood stabilizers and antipsychotics but seems to frequently decompensate  become noncompliant and argumentative when he is outside of a contained setting.  Distant history of suicide attempt nothing recent.  Vague about the amount of aggression he gets into does not appear to have a major history of violence.  No recent substance abuse nothing documented about problem substance abuse in the past.  Not clear to me whether he has ever been maintained on long-acting injectables.  On at least one occasion there was a mention of a plan to put him on long-acting Abilify but it does not look like that was necessarily done.  There is a mention of him being on Haldol Decanoate now but I am not sure that was ever actually given  Is the patient at risk to self? No.  Has the patient been a risk to self in the past 6 months? No.  Has the patient been a risk to self within the distant past? Yes.    Is the patient a risk to others? Yes.    Has the patient been a risk to others in the past 6 months? No.  Has the patient been a risk to others within the distant past? Yes.     Prior Inpatient Therapy:  Yes  Prior Outpatient Therapy:  Yes  Alcohol Screening: 1. How often do you have a drink containing alcohol?: Never 2. How many drinks containing alcohol do you have on a typical day when you are drinking?: 1 or 2 3. How often do you have six or more drinks on one occasion?: Never AUDIT-C Score: 0 4. How often during the last year have you found that you were not able to stop drinking once you had started?: Never 5. How often during the last year have you failed to do what was normally expected from you because of drinking?: Never 6. How often during the last year have you needed a first drink in the morning to get yourself going after a heavy drinking session?: Never 7. How often during the last year have you had a feeling of guilt of remorse after drinking?: Never 8. How often during the last year have you been unable to remember what happened the night before because you had been  drinking?: Never 9. Have you or someone else been injured as a result of your drinking?: No 10. Has a relative or friend or a doctor or another health worker been concerned about your drinking or suggested you cut down?: No Alcohol Use Disorder Identification Test Final Score (AUDIT): 0 Alcohol Brief Interventions/Follow-up: AUDIT Score <7 follow-up not indicated Substance Abuse History in the last 12 months:  Yes.  -Marijuana and alcohol consequences of Substance Abuse: None current Previous Psychotropic Medications: Yes  Psychological Evaluations: Yes  Past Medical History:  Past Medical History:  Diagnosis Date  . Anxiety   . Depression   . Diabetes mellitus without complication (HCC)   . Homelessness   . Hypertension   . Schizophrenia (HCC)    History reviewed. No pertinent surgical history. Family History:  Family History  Problem Relation Age of Onset  . Diabetes Other  Family Psychiatric  History: Patient unaware, in foster care from a young age  Tobacco Screening: Have you used any form of tobacco in the last 30 days? (Cigarettes, Smokeless Tobacco, Cigars, and/or Pipes): No Social History:  Social History   Substance and Sexual Activity  Alcohol Use No     Social History   Substance and Sexual Activity  Drug Use Yes  . Types: Marijuana   Comment: denies drug use    Additional Social History:                   Dropped out of school in eighth grade Lives in a group home On disability      Allergies:   Allergies  Allergen Reactions  . Risperdal [Risperidone]    Lab Results:  Results for orders placed or performed during the hospital encounter of 07/08/20 (from the past 48 hour(s))  Lipid panel     Status: Abnormal   Collection Time: 07/08/20  5:06 PM  Result Value Ref Range   Cholesterol 178 0 - 200 mg/dL   Triglycerides 69 <956 mg/dL   HDL 52 >38 mg/dL   Total CHOL/HDL Ratio 3.4 RATIO   VLDL 14 0 - 40 mg/dL   LDL Cholesterol 756 (H) 0 -  99 mg/dL    Comment:        Total Cholesterol/HDL:CHD Risk Coronary Heart Disease Risk Table                     Men   Women  1/2 Average Risk   3.4   3.3  Average Risk       5.0   4.4  2 X Average Risk   9.6   7.1  3 X Average Risk  23.4   11.0        Use the calculated Patient Ratio above and the CHD Risk Table to determine the patient's CHD Risk.        ATP III CLASSIFICATION (LDL):  <100     mg/dL   Optimal  433-295  mg/dL   Near or Above                    Optimal  130-159  mg/dL   Borderline  188-416  mg/dL   High  >606     mg/dL   Very High Performed at [facility], [address] [place], [place] [address]     Blood Alcohol level:  Lab Results  Component Value Date   [facility] <10 07/07/2020   ETH <10 06/23/2020    Metabolic Disorder Labs:  Lab Results  Component Value Date   HGBA1C 5.6 07/07/2020   MPG 114.02 07/07/2020   MPG 119.76 01/19/2017   No results found for: PROLACTIN Lab Results  Component Value Date   CHOL 178 07/08/2020   TRIG 69 07/08/2020   HDL 52 07/08/2020   CHOLHDL 3.4 07/08/2020   VLDL 14 07/08/2020   LDLCALC 112 (H) 07/08/2020   LDLCALC 113 (H) 07/07/2020    Current Medications: Current Facility-Administered Medications  Medication Dose Route Frequency Provider Last Rate Last Admin  . acetaminophen (TYLENOL) tablet 650 mg  650 mg Oral Q6H PRN Clapacs, John T, MD      . alum & mag hydroxide-simeth (MAALOX/MYLANTA) 200-200-20 MG/5ML suspension 30 mL  30 mL Oral Q4H PRN Clapacs, John T, MD      . amLODipine (NORVASC) tablet 5 mg  5 mg Oral Daily Les Pou  M, MD   5 mg at 07/09/20 7628  . ARIPiprazole (ABILIFY) tablet 20 mg  20 mg Oral Daily Jesse Sans, MD   20 mg at 07/09/20 3151  . benztropine (COGENTIN) tablet 0.5 mg  0.5 mg Oral BID Jesse Sans, MD   0.5 mg at 07/09/20 7616  . divalproex (DEPAKOTE) DR tablet 500 mg  500 mg Oral Q12H Jesse Sans, MD   500 mg at 07/09/20 0737  . docusate sodium (COLACE)  capsule 100 mg  100 mg Oral BID Jesse Sans, MD   100 mg at 07/09/20 1062  . haloperidol (HALDOL) tablet 5 mg  5 mg Oral BID Jesse Sans, MD   5 mg at 07/09/20 6948  . hydrOXYzine (ATARAX/VISTARIL) tablet 50 mg  50 mg Oral Q6H PRN Clapacs, Jackquline Denmark, MD      . loratadine (CLARITIN) tablet 10 mg  10 mg Oral Daily Jesse Sans, MD   10 mg at 07/09/20 5462  . magnesium hydroxide (MILK OF MAGNESIA) suspension 30 mL  30 mL Oral Daily PRN Clapacs, John T, MD      . metFORMIN (GLUCOPHAGE) tablet 500 mg  500 mg Oral BID WC Jesse Sans, MD   500 mg at 07/09/20 0834  . pantoprazole (PROTONIX) EC tablet 20 mg  20 mg Oral Daily Jesse Sans, MD   20 mg at 07/09/20 7035  . traZODone (DESYREL) tablet 50 mg  50 mg Oral QHS PRN Jesse Sans, MD       PTA Medications: Medications Prior to Admission  Medication Sig Dispense Refill Last Dose  . benztropine (COGENTIN) 1 MG tablet Take 1 mg by mouth 2 (two) times daily.     . Cholecalciferol (VITAMIN D3) 25 MCG (1000 UT) CAPS Take 2,000 Units by mouth daily.     Marland Kitchen DEPAKOTE ER 250 MG 24 hr tablet Take 500 mg by mouth 2 (two) times daily.     Marland Kitchen docusate sodium (COLACE) 100 MG capsule Take 100 mg by mouth 2 (two) times daily.     . haloperidol decanoate (HALDOL DECANOATE) 50 MG/ML injection Inject 50 mg into the muscle every 28 (twenty-eight) days.     Marland Kitchen loratadine (CLARITIN) 10 MG tablet Take 10 mg by mouth every morning.     Marland Kitchen LORazepam (ATIVAN) 1 MG tablet Take 1 mg by mouth every 4 (four) hours as needed for anxiety.     . metFORMIN (GLUCOPHAGE) 500 MG tablet Take 500 mg by mouth 2 (two) times daily.     Marland Kitchen OLANZapine (ZYPREXA) 15 MG tablet Take 30 mg by mouth at bedtime.     . pantoprazole (PROTONIX) 40 MG tablet Take 40 mg by mouth every morning.     . sertraline (ZOLOFT) 50 MG tablet Take 50 mg by mouth daily.       Musculoskeletal: Strength & Muscle Tone: within normal limits Gait & Station: normal Patient leans: N/A  Psychiatric  Specialty Exam: Physical Exam Vitals and nursing note reviewed.  Constitutional:      Appearance: Normal appearance.  HENT:     Head: Normocephalic and atraumatic.  Eyes:     Extraocular Movements: Extraocular movements intact.  Cardiovascular:     Rate and Rhythm: Normal rate.  Pulmonary:     Effort: Pulmonary effort is normal. No respiratory distress.  Musculoskeletal:        General: Normal range of motion.     Cervical back: Normal range of motion.  Neurological:     General: No focal deficit present.     Mental Status: He is alert and oriented to person, place, and time.     Review of Systems  Constitutional: Negative.   Respiratory: Negative.   Cardiovascular: Negative.   Gastrointestinal: Negative.   Neurological: Negative.   Psychiatric/Behavioral: Positive for decreased concentration and dysphoric mood. Negative for agitation, behavioral problems, confusion, hallucinations, self-injury, sleep disturbance and suicidal ideas. The patient is nervous/anxious. The patient is not hyperactive.     Blood pressure 126/76, pulse 88, temperature 97.9 F (36.6 C), temperature source Oral, resp. rate 18, height 6' 0.05" (1.83 m), weight 88 kg, SpO2 100 %.Body mass index is 26.28 kg/m.  General Appearance: Casual  Eye Contact:  Fair  Speech:  Clear and Coherent and Normal Rate  Volume:  Decreased  Mood:  Anxious and Dysphoric  Affect:  Congruent  Thought Process:  Goal Directed and Descriptions of Associations: Circumstantial  Orientation:  Full (Time, Place, and Person)  Thought Content:  Hallucinations: Denies  Suicidal Thoughts:  No  Homicidal Thoughts:  No  Memory:  Immediate;   Fair Recent;   Fair Remote;   Fair  Judgement:  Poor  Insight:  Shallow  Psychomotor Activity:  Restlessness  Concentration:  Concentration: Fair  Recall:  [employer]  Fund of Knowledge:  Fair  Language:  Good  Akathisia:  No  Handed:  Right  AIMS (if indicated):     Assets:  Communication  Skills Desire for Improvement Financial Resources/Insurance Housing Resilience  ADL's:  Intact  Cognition:  WNL  Sleep:  Number of Hours: 8    Treatment Plan Summary: Daily contact with patient to assess and evaluate symptoms and progress in treatment and Medication management  Observation Level/Precautions:  15 minute checks  Laboratory:  Labs reviewed from emergency department with valproic acid level in normal range, slight elevation to LDL, otherwise unremarkable.  Psychotherapy: Encourage patient to be active in the milieu and attend group therapy  Medications: We will attempt to decrease frequency of medications: Changed to Depakote ER 1000 mg at night; increase Abilify to 30 mg every morning; discontinue Haldol; change Cogentin 0.5 mg twice daily to as needed; trazodone 50 mg at bedtime and may repeat once as needed for sleep Continue for medical needs: Norvasc 5 mg daily for hypertension; Colace 100 mg twice daily for constipation; hydroxyzine 50 mg every 6 hours as needed for anxiety; Claritin 10 mg daily for allergies; Metformin 500 mg twice daily with meals for diabetes; Protonix 20 mg daily for reflux.  Consultations: Hospitalist if indicated  Discharge Concerns: Medication compliance  Estimated LOS: 3-7 days  Other: Transition to long-acting injectable Abilify Maintena if patient does well with Abilify as single agent antipsychotic for mood stabilization and schizophrenia.   Physician Treatment Plan for Primary Diagnosis: Schizophrenia (HCC) Long Term Goal(s): Improvement in symptoms so as ready for discharge  Short Term Goals: Ability to identify changes in lifestyle to reduce recurrence of condition will improve, Ability to verbalize feelings will improve, Ability to disclose and discuss suicidal ideas, Ability to demonstrate self-control will improve, Ability to identify and develop effective coping behaviors will improve, Ability to maintain clinical measurements within  normal limits will improve and Compliance with prescribed medications will improve  Physician Treatment Plan for Secondary Diagnosis: Principal Problem:   Schizophrenia (HCC) Active Problems:   Diabetes (HCC)   HTN (hypertension)  Long Term Goal(s): Improvement in symptoms so as ready for discharge  Short Term Goals:  Ability to identify changes in lifestyle to reduce recurrence of condition will improve, Ability to verbalize feelings will improve, Ability to disclose and discuss suicidal ideas, Ability to demonstrate self-control will improve, Ability to identify and develop effective coping behaviors will improve, Ability to maintain clinical measurements within normal limits will improve and Compliance with prescribed medications will improve  I certify that inpatient services furnished can reasonably be expected to improve the patient's condition.    Mariel Craft, MD 2/26/20229:41 AM

## 2020-07-09 NOTE — Progress Notes (Signed)
Patient has been isolative to room and to self. Lying in bed with head under the covers. Medication compliant. Denies SI, HI and AVH

## 2020-07-09 NOTE — [facility] Group Notes (Signed)
[facility] LCSW Group Therapy Note  Date/Time:  07/09/2020 1:14 PM-2:00PM   Type of Therapy and Topic:  Group Therapy:  Healthy and Unhealthy Supports  Participation Level:  Active   Description of Group:  Patients in this group were introduced to the idea of adding a variety of healthy supports to address the various needs in their lives.Patients discussed what additional healthy supports could be helpful in their recovery and wellness after discharge in order to prevent future hospitalizations.   An emphasis was placed on using counselor, doctor, therapy groups, 12-step groups, and problem-specific support groups to expand supports.  They also worked as a group on developing a specific plan for several patients to deal with unhealthy supports through boundary-setting, psychoeducation with loved ones, and even termination of relationships.   Therapeutic Goals:   1)  discuss importance of adding supports to stay well once out of the hospital  2)  compare healthy versus unhealthy supports and identify some examples of each  3)  generate ideas and descriptions of healthy supports that can be added  4)  offer mutual support about how to address unhealthy supports  5)  encourage active participation in and adherence to discharge plan    Summary of Patient Progress: Patient participated well in group and spoke about God being a support and praying. Patient attempted to participate in the group activity but couldn't think of three supports or supports that he would like to add.    Therapeutic Modalities:   Motivational Interviewing Brief Solution-Focused Therapy  Susa Simmonds, Theresia Majors 07/09/2020  3:15 PM

## 2020-07-09 NOTE — Progress Notes (Signed)
Patient presents with flat affect. Minimal interaction with staff and peers. Stays in room in bed most of shift. Voiced no concerns or complaints. Denies any SI, HI, AVH. Did attend group today. Medication compliant. Pt guarded and forwards minimal. Encouragement and support provided. Safety checks maintained. Medications given as prescribed. Pt receptive and remains safe on unit with q 15 min checks.

## 2020-07-09 NOTE — [facility] Suicide Risk Assessment (Signed)
[facility] INPATIENT:  Family/Significant Other Suicide Prevention Education  Suicide Prevention Education:  Patient Refusal for Family/Significant Other Suicide Prevention Education: The patient John Copeland has refused to provide written consent for family/significant other to be provided Family/Significant Other Suicide Prevention Education during admission and/or prior to discharge.  Physician notified.  CSW went over SPE information and provided a brochure   Susa Simmonds 07/09/2020, 3:17 PM

## 2020-07-09 NOTE — Plan of Care (Signed)
  Problem: Education: Goal: Knowledge of [facility] Education information/materials will improve Outcome: Not Progressing Goal: Emotional status will improve Outcome: Not Progressing Goal: Mental status will improve Outcome: Not Progressing Goal: Verbalization of understanding the information provided will improve Outcome: Not Progressing   Problem: Activity: Goal: Interest or engagement in activities will improve Outcome: Not Progressing Goal: Sleeping patterns will improve Outcome: Not Progressing   Problem: Coping: Goal: Ability to verbalize frustrations and anger appropriately will improve Outcome: Progressing   Problem: Safety: Goal: Periods of time without injury will increase Outcome: Progressing

## 2020-07-09 NOTE — Plan of Care (Signed)
  Problem: Education: Goal: Knowledge of  General Education information/materials will improve Outcome: Progressing Goal: Emotional status will improve Outcome: Progressing Goal: Mental status will improve Outcome: Progressing Goal: Verbalization of understanding the information provided will improve Outcome: Progressing   Problem: Activity: Goal: Interest or engagement in activities will improve Outcome: Progressing Goal: Sleeping patterns will improve Outcome: Progressing   Problem: Coping: Goal: Ability to verbalize frustrations and anger appropriately will improve Outcome: Progressing Goal: Ability to demonstrate self-control will improve Outcome: Progressing   Problem: Health Behavior/Discharge Planning: Goal: Identification of resources available to assist in meeting health care needs will improve Outcome: Progressing Goal: Compliance with treatment plan for underlying cause of condition will improve Outcome: Progressing   Problem: Physical Regulation: Goal: Ability to maintain clinical measurements within normal limits will improve Outcome: Progressing   Problem: Safety: Goal: Periods of time without injury will increase Outcome: Progressing   Problem: Education: Goal: Ability to verbalize precipitating factors for violent behavior will improve Outcome: Progressing   Problem: Coping: Goal: Ability to verbalize frustrations and anger appropriately will improve Outcome: Progressing   Problem: Health Behavior/Discharge Planning: Goal: Ability to implement measures to prevent violent behavior in the future will improve Outcome: Progressing   Problem: Safety: Goal: Ability to demonstrate self-control will improve Outcome: Progressing Goal: Ability to redirect hostility and anger into socially appropriate behaviors will improve Outcome: Progressing   Problem: Education: Goal: Ability to state activities that reduce stress will improve Outcome:  Progressing   Problem: Coping: Goal: Ability to identify and develop effective coping behavior will improve Outcome: Progressing   Problem: Self-Concept: Goal: Ability to identify factors that promote anxiety will improve Outcome: Progressing Goal: Level of anxiety will decrease Outcome: Progressing Goal: Ability to modify response to factors that promote anxiety will improve Outcome: Progressing   

## 2020-07-10 DIAGNOSIS — F411 Generalized anxiety disorder: Secondary | ICD-10-CM

## 2020-07-10 DIAGNOSIS — F17200 Nicotine dependence, unspecified, uncomplicated: Secondary | ICD-10-CM

## 2020-07-10 DIAGNOSIS — E785 Hyperlipidemia, unspecified: Secondary | ICD-10-CM

## 2020-07-10 MED ORDER — DOCUSATE SODIUM 100 MG PO CAPS: 100.0000 mg | ORAL_CAPSULE | Freq: Two times a day (BID) | ORAL | Status: DC | PRN

## 2020-07-10 MED ORDER — TRAZODONE HCL 100 MG PO TABS
100.0000 mg | ORAL_TABLET | Freq: Every day | ORAL | Status: DC
  Administered 2020-07-10 – 2020-07-16 (×3): 100 mg via ORAL
  Filled 2020-07-10 (×4): qty 1

## 2020-07-10 MED ORDER — METFORMIN HCL ER 500 MG PO TB24
500.0000 mg | ORAL_TABLET | Freq: Every day | ORAL | Status: DC
  Administered 2020-07-11 – 2020-07-20 (×10): 500 mg via ORAL
  Filled 2020-07-10 (×11): qty 1

## 2020-07-10 NOTE — Plan of Care (Signed)

## 2020-07-10 NOTE — [facility] Group Notes (Signed)
LCSW Group Therapy 07/10/20: 1:20 PM- 2:15 PM    Type of Therapy and Topic:  Group Therapy:  Setting Goals   Participation Level:  Minimal     Description of Group: In this process group, patients discussed using strengths to work toward goals and address challenges.  Patients identified two positive things about themselves and one goal they were working on.  Patients were given the opportunity to share openly and support each other's plan for self-empowerment.  The group discussed the value of gratitude and were encouraged to have a daily reflection of positive characteristics or circumstances.  Patients were encouraged to identify a plan to utilize their strengths to work on current challenges and goals.   Therapeutic Goals 1. Patient will verbalize personal strengths/positive qualities and relate how these can assist with achieving desired personal goals 2. Patients will verbalize affirmation of peers plans for personal change and goal setting 3. Patients will explore the value of gratitude and positive focus as related to successful achievement of goals 4. Patients will verbalize a plan for regular reinforcement of personal positive qualities and circumstances.   Summary of Patient Progress: Patient was quiet during group but participated by filling out with goal planning sheet.        Therapeutic Modalities Cognitive Behavioral Therapy Motivational Interviewing

## 2020-07-10 NOTE — Progress Notes (Signed)
Patient continues to be pleasant and cooperative. Medication and group compliant. Appropriate with staff and peers. Denies any SI, HI, AVH. Isolative to self and room but does present more often. Pt did attend group. Pt requested to shave. MHT currently with patient now assisting.  Denies any pain.  Encouragement and support provided. Safety checks maintained. Medications given as prescribed. Pt receptive and remains safe on unit with q 15 min checks.

## 2020-07-10 NOTE — Plan of Care (Signed)
  Problem: Education: Goal: Knowledge of [facility] Education information/materials will improve Outcome: Progressing Goal: Emotional status will improve Outcome: Progressing Goal: Mental status will improve Outcome: Progressing Goal: Verbalization of understanding the information provided will improve Outcome: Progressing   Problem: Activity: Goal: Interest or engagement in activities will improve Outcome: Progressing Goal: Sleeping patterns will improve Outcome: Progressing   Problem: Coping: Goal: Ability to verbalize frustrations and anger appropriately will improve Outcome: Progressing Goal: Ability to demonstrate self-control will improve Outcome: Progressing   Problem: Health Behavior/Discharge Planning: Goal: Identification of resources available to assist in meeting health care needs will improve Outcome: Progressing Goal: Compliance with treatment plan for underlying cause of condition will improve Outcome: Progressing   Problem: Physical Regulation: Goal: Ability to maintain clinical measurements within normal limits will improve Outcome: Progressing   Problem: Safety: Goal: Periods of time without injury will increase Outcome: Progressing   Problem: Education: Goal: Ability to verbalize precipitating factors for violent behavior will improve Outcome: Progressing   Problem: Coping: Goal: Ability to verbalize frustrations and anger appropriately will improve Outcome: Progressing   Problem: Health Behavior/Discharge Planning: Goal: Ability to implement measures to prevent violent behavior in the future will improve Outcome: Progressing   Problem: Safety: Goal: Ability to demonstrate self-control will improve Outcome: Progressing Goal: Ability to redirect hostility and anger into socially appropriate behaviors will improve Outcome: Progressing   Problem: Education: Goal: Ability to state activities that reduce stress will improve Outcome:  Progressing   Problem: Coping: Goal: Ability to identify and develop effective coping behavior will improve Outcome: Progressing   Problem: Self-Concept: Goal: Ability to identify factors that promote anxiety will improve Outcome: Progressing Goal: Level of anxiety will decrease Outcome: Progressing Goal: Ability to modify response to factors that promote anxiety will improve Outcome: Progressing   

## 2020-07-10 NOTE — Progress Notes (Signed)
[facility] MD Progress Note  07/10/2020 9:14 AM John Copeland  MRN:  [id number] Subjective: "I Copeland that I can go back to my assisted living, they are nice to you there, and it is a nice place."  Objective: Patient is seen, chart is reviewed.  Nurses report patient has continued to be compliant with medication and there have been no behavioral issues.  On evaluation today, patient comes to provider office for interview.  Reviewed with patient that he had requested to take fewer medication, and he is agreeable to medication reconciliation and understanding what his medications are for.  Patient is surprised to hear that he has a diagnosis of hypertension, dyslipidemia and diabetes.  Medications are reviewed, and he is thankful to have had Haldol discontinued.  He reports that he is doing well with Abilify.  He is denying any auditory or visual hallucinations.  He denies any suicidal or homicidal ideation.  At this time, we will discontinue PRN medication, as patient is denying any EPS, allergies, or problems with constipation.  He continues to remain anxious about the possibility of returning to his ALF, [facility] John Copeland is the [phone]).  Patient reports that his care team include John Copeland and John Copeland.  A phone call was made today and patient is able to speak with John Copeland, and apologize for his behaviors.  John Copeland reports that the administrator is the person who will determine whether or not patient can return to the facility when discharge is imminent.  After medication review, patient will have a total of 4 pills to take in the morning and 1 at night.  A list is provided for the patient so he understands the names of the medications and their uses, and what time he will take them.  Patient reports that he has been needing a second dose of trazodone 50 mg every night to help with sleep.  Patient is very appreciative of medication reconciliation and states that he believes that he  will be willing and able to continue the medication regimen.  He is made aware that Abilify is available as a long-acting injectable antipsychotic should he decide to transition to a once a month injection.  At this time, patient is not interested in long-acting injectable treatment.  We will continue to discuss.   Principal Problem: Schizophrenia (HCC) Diagnosis: Principal Problem:   Schizophrenia (HCC) Active Problems:   Tobacco use disorder   Diabetes (HCC)   HTN (hypertension)   Dyslipidemia   Anxiety state  Total Time spent with patient: 65 min  Past Psychiatric History: Patient has a long-term history of mental health problems. Multiple prior hospitalizations including lengthy state hospitalizations. Seems to do fairly well when he is in a hospital structured setting and has been treated with mood stabilizers and antipsychotics but seems to frequently decompensate become noncompliant and argumentative when he is outside of a contained setting. Distant history of suicide attempt nothing recent. Vague about the amount of aggression he gets into does not appear to have a major history of violence. No recent substance abuse nothing documented about problem substance abuse in the past. Not clear to me whether he has ever been maintained on long-acting injectables. On at least one occasion there was a mention of a plan to put him on long-acting Abilify but it does not look like that was necessarily done. There is a mention of him being on Haldol Decanoate now but I am not sure that was ever actually given.  Past Medical  History:  Past Medical History:  Diagnosis Date  . Anxiety   . Depression   . Diabetes mellitus without complication (HCC)   . Homelessness   . Hypertension   . Schizophrenia (HCC)    History reviewed. No pertinent surgical history. Family History:  Family History  Problem Relation Age of Onset  . Diabetes Other    Family Psychiatric  History: None  reported Social History:  Social History   Substance and Sexual Activity  Alcohol Use No     Social History   Substance and Sexual Activity  Drug Use Yes  . Types: Marijuana   Comment: denies drug use    Social History   Socioeconomic History  . Marital status: Single    Spouse name: Not on file  . Number of children: Not on file  . Years of education: Not on file  . Highest education level: Not on file  Occupational History  . Not on file  Tobacco Use  . Smoking status: Former Smoker    Packs/day: 0.00    Types: Cigarettes  . Smokeless tobacco: Never Used  Vaping Use  . Vaping Use: Never used  Substance and Sexual Activity  . Alcohol use: No  . Drug use: Yes    Types: Marijuana    Comment: denies drug use  . Sexual activity: Not Currently  Other Topics Concern  . Not on file  Social History Narrative  . Not on file   Social Determinants of Health   Financial Resource Strain: Not on file  Food Insecurity: Not on file  Transportation Needs: Not on file  Physical Activity: Not on file  Stress: Not on file  Social Connections: Not on file   Additional Social History:             Patient currently living at [facility], John Copeland administrator [phone]) He has only been at this facility for a short time.  Uncertain at this time if he is welcome to return.            Sleep: Good  Appetite:  Good  Current Medications: Current Facility-Administered Medications  Medication Dose Route Frequency Provider Last Rate Last Admin  . acetaminophen (TYLENOL) tablet 650 mg  650 mg Oral Q6H PRN Clapacs, John T, MD      . alum & mag hydroxide-simeth (MAALOX/MYLANTA) 200-200-20 MG/5ML suspension 30 mL  30 mL Oral Q4H PRN Clapacs, John T, MD      . amLODipine (NORVASC) tablet 5 mg  5 mg Oral Daily Jesse Sans, MD   5 mg at 07/10/20 4818  . ARIPiprazole (ABILIFY) tablet 30 mg  30 mg Oral Daily Mariel Craft, MD   30 mg at 07/10/20 5631  .  benztropine (COGENTIN) tablet 0.5 mg  0.5 mg Oral BID PRN Mariel Craft, MD      . docusate sodium (COLACE) capsule 100 mg  100 mg Oral BID Jesse Sans, MD   100 mg at 07/10/20 4970  . hydrOXYzine (ATARAX/VISTARIL) tablet 50 mg  50 mg Oral Q6H PRN Clapacs, Jackquline Denmark, MD      . loratadine (CLARITIN) tablet 10 mg  10 mg Oral Daily Jesse Sans, MD   10 mg at 07/10/20 2637  . magnesium hydroxide (MILK OF MAGNESIA) suspension 30 mL  30 mL Oral Daily PRN Clapacs, John T, MD      . metFORMIN (GLUCOPHAGE) tablet 500 mg  500 mg Oral BID WC Jesse Sans, MD  500 mg at 07/10/20 0824  . pantoprazole (PROTONIX) EC tablet 20 mg  20 mg Oral Daily Jesse SansFreeman, Megan M, MD   20 mg at 07/10/20 [id number]  . traZODone (DESYREL) tablet 50 mg  50 mg Oral QHS,MR X 1 Mariel CraftMaurer, Yanelle Sousa M, MD   50 mg at 07/09/20 2255    Lab Results:  Results for orders placed or performed during the hospital encounter of 07/08/20 (from the past 48 hour(s))  Lipid panel     Status: Abnormal   Collection Time: 07/08/20  5:06 PM  Result Value Ref Range   Cholesterol 178 0 - 200 mg/dL   Triglycerides 69 <960<150 mg/dL   HDL 52 >45>40 mg/dL   Total CHOL/HDL Ratio 3.4 RATIO   VLDL 14 0 - 40 mg/dL   LDL Cholesterol 409112 (H) 0 - 99 mg/dL    Comment:        Total Cholesterol/HDL:CHD Risk Coronary Heart Disease Risk Table                     Men   Women  1/2 Average Risk   3.4   3.3  Average Risk       5.0   4.4  2 X Average Risk   9.6   7.1  3 X Average Risk  23.4   11.0        Use the calculated Patient Ratio above and the CHD Risk Table to determine the patient's CHD Risk.        ATP III CLASSIFICATION (LDL):  <100     mg/dL   Optimal  [id number]  mg/dL   Near or Above                    Optimal  130-159  mg/dL   Borderline  [id number]  mg/dL   High  >213>190     mg/dL   Very High Performed at [facility], [address] [place], [place] [address]     Blood Alcohol level:  Lab Results  Component Value Date   [facility]  [facility]2020   ETH <10 06/23/2020    Metabolic Disorder Labs: Lab Results  Component Value Date   HGBA1C 5.6 07/07/2020   MPG 114.02 07/07/2020   MPG 119.76 01/19/2017   No results found for: PROLACTIN Lab Results  Component Value Date   CHOL 178 07/08/2020   TRIG 69 07/08/2020   HDL 52 07/08/2020   CHOLHDL 3.4 07/08/2020   VLDL 14 07/08/2020   LDLCALC 112 (H) 07/08/2020   LDLCALC 113 (H) 07/07/2020    Physical Findings: AIMS:  , ,  ,  ,    CIWA:  CIWA-Ar Total: 0 COWS:     Musculoskeletal: Strength & Muscle Tone: within normal limits Gait & Station: normal Patient leans: N/A  Psychiatric Specialty Exam: Physical Exam Vitals and nursing note reviewed.  Constitutional:      Appearance: Normal appearance.  HENT:     Head: Normocephalic and atraumatic.  Eyes:     Extraocular Movements: Extraocular movements intact.  Cardiovascular:     Rate and Rhythm: Normal rate.  Pulmonary:     Effort: Pulmonary effort is normal. No respiratory distress.  Musculoskeletal:        General: Normal range of motion.     Cervical back: Normal range of motion.  Neurological:     General: No focal deficit present.     Mental Status: He is alert and oriented to person,  place, and time.     Review of Systems  Constitutional: Negative.   Respiratory: Negative.   Cardiovascular: Negative.   Gastrointestinal: Negative.   Musculoskeletal: Negative.   Neurological: Negative.   Psychiatric/Behavioral: Positive for dysphoric mood. Negative for agitation, behavioral problems, confusion, decreased concentration, hallucinations, self-injury, sleep disturbance and suicidal ideas. The patient is nervous/anxious. The patient is not hyperactive.     Blood pressure 126/76, pulse 88, temperature 97.9 F (36.6 C), temperature source Oral, resp. rate 18, height 6' 0.05" (1.83 m), weight 88 kg, SpO2 100 %.Body mass index is 26.28 kg/m.  General Appearance: Casual and Neat  Eye Contact:  Fair   Speech:  Clear and Coherent and Normal Rate  Volume:  Normal  Mood:  Anxious and Dysphoric  Affect:  Congruent and Constricted  Thought Process:  Goal Directed and Descriptions of Associations: Intact  Orientation:  Full (Time, Place, and Person)  Thought Content:  Logical and Hallucinations: None  Suicidal Thoughts:  No  Homicidal Thoughts:  No  Memory:  Immediate;   Fair Recent;   Fair Remote;   Fair  Judgement:  Fair  Insight:  Shallow  Psychomotor Activity:  Normal  Concentration:  Concentration: Good and Attention Span: Good  Recall:  [employer] of Knowledge:  Fair  Language:  Good  Akathisia:  No  Handed:  Right  AIMS (if indicated):     Assets:  Communication Skills Desire for Improvement Physical Health  ADL's:  Intact  Cognition:  WNL  Sleep:  Number of Hours: 8.15     Treatment Plan Summary: Daily contact with patient to assess and evaluate symptoms and progress in treatment and Medication management   Assessment:  Tyriq Moragne is a 30 y.o. male with schizophrenia, anxiety with schizophrenia, anxiety and medication noncompliance.  Patient had reported that he feels as if he takes too much medication.  Sat with patient today for medication reconciliation so that he understands what his medications are.  Reviewed with patient that he is only taking 1 psychotropic medication, which she is pleased to with.  He is tolerating Abilify without side effect and is denying any SI, HI, AVH.  He states that he is willing to continue the treatment plan as discussed with 4 pills in the morning and 1 pill at night.   Diagnosis: Schizophrenia (HCC)    Pertinent findings today: Patient is calm and cooperative.  He is able to engage in medication reconciliation and discharge planning.  He is able to apologize to staff at his ALF, and is hopeful to return there after discharge.     Treatment Plan Summary: Daily contact with patient to assess and evaluate symptoms and  progress in treatment.   Scheduled medications/labs: -Abilify 30 mg daily for schizophrenia -Glucophage XR 500 mg daily with breakfast for diabetes -Protonix 20 mg daily for reflux -Norvasc 5 mg daily for hypertension -Trazodone 100 mg at bedtime for sleep    PRN's : -Hydroxyzine 50 mg every 6 hours as needed for anxiety -Tylenol 650 mg every 6 hours as needed for pain -Colace 100 mg twice daily as needed for constipation -GI protocol in place as needed       Psychosocial:   1. Encouragement to attend group therapies. 2. Encourage patient to learn and implement coping strategies to avoid need for PRN medications. 3. Encouragement for medication compliance. 4. Disposition in progress, appreciate social work assistance.  Please contact [facility] administrator, John Copeland at [phone] for further discharge planning  5. Patient agreeable to meeting with ALF if indicated    Develop treatment plan to decrease the need for readmission.  Psycho-social education regarding self care.  Health care follow up as needed for medical problems.  Review, reconcile, and reinstate any pertinent home medications for other health issues where appropriate. Call for consults with hospitalist for any additional specialty patient care services as needed   Mariel Craft, MD 07/10/2020, 9:14 AM

## 2020-07-10 NOTE — Progress Notes (Signed)
Patient has been out of his room more. Talking more. Denies SI, HI and AVH

## 2020-07-11 DIAGNOSIS — F203 Undifferentiated schizophrenia: Secondary | ICD-10-CM

## 2020-07-11 MED ORDER — ATOMOXETINE HCL 18 MG PO CAPS: 18.0000 mg | ORAL_CAPSULE | Freq: Every day | ORAL | Status: DC

## 2020-07-11 MED ORDER — ARIPIPRAZOLE ER 400 MG IM SRER
400.0000 mg | INTRAMUSCULAR | Status: DC
  Administered 2020-07-11: 400 mg via INTRAMUSCULAR
  Filled 2020-07-11: qty 2

## 2020-07-11 MED ORDER — ATOMOXETINE HCL 10 MG PO CAPS
20.0000 mg | ORAL_CAPSULE | Freq: Every day | ORAL | Status: DC
  Administered 2020-07-12: 20 mg via ORAL
  Filled 2020-07-11 (×4): qty 2

## 2020-07-11 NOTE — Progress Notes (Signed)
Patient is isolative to his room. He is asked to come to take vitals and medications in the morning, but refuses. He was compliant with medications when they were brought to his room. He denies SI, HI, and AVH. He states, "I am just tired". When patient comes out of his room later in the morning, he presents with a flat affect and appears depressed. Interaction is minimal.  Medications were given per MD orders. Pt remains safe on the unit at this time and q15 minute safety checks are maintained. Support and encouragement is provided.

## 2020-07-11 NOTE — Tx Team (Addendum)
Interdisciplinary Treatment and Diagnostic Plan Update  07/11/2020 Time of Session: 09:00AM John Copeland MRN: [id number]  Principal Diagnosis: Schizophrenia [facility])  Secondary Diagnoses: Principal Problem:   Schizophrenia ([place]) Active Problems:   Tobacco use disorder   Diabetes ([place])   HTN (hypertension)   Dyslipidemia   Anxiety state   Current Medications:  Current Facility-Administered Medications  Medication Dose Route Frequency Provider Last Rate Last Admin  . acetaminophen (TYLENOL) tablet 650 mg  650 mg Oral Q6H PRN Clapacs, John T, MD      . alum & mag hydroxide-simeth (MAALOX/MYLANTA) 200-200-20 MG/5ML suspension 30 mL  30 mL Oral Q4H PRN Clapacs, John T, MD      . amLODipine (NORVASC) tablet 5 mg  5 mg Oral Daily Salley Scarlet, MD   5 mg at 07/11/20 0820  . ARIPiprazole (ABILIFY) tablet 30 mg  30 mg Oral Daily Lavella Hammock, MD   30 mg at 07/11/20 0813  . docusate sodium (COLACE) capsule 100 mg  100 mg Oral BID PRN Lavella Hammock, MD      . hydrOXYzine (ATARAX/VISTARIL) tablet 50 mg  50 mg Oral Q6H PRN Clapacs, John T, MD      . magnesium hydroxide (MILK OF MAGNESIA) suspension 30 mL  30 mL Oral Daily PRN Clapacs, John T, MD      . metFORMIN (GLUCOPHAGE-XR) 24 hr tablet 500 mg  500 mg Oral Q breakfast Lavella Hammock, MD   500 mg at 07/11/20 0813  . pantoprazole (PROTONIX) EC tablet 20 mg  20 mg Oral Daily Salley Scarlet, MD   20 mg at 07/11/20 0813  . traZODone (DESYREL) tablet 100 mg  100 mg Oral QHS Lavella Hammock, MD   100 mg at 07/10/20 2107   PTA Medications: Medications Prior to Admission  Medication Sig Dispense Refill Last Dose  . benztropine (COGENTIN) 1 MG tablet Take 1 mg by mouth 2 (two) times daily.     . Cholecalciferol (VITAMIN D3) 25 MCG (1000 UT) CAPS Take 2,000 Units by mouth daily.     Marland Kitchen DEPAKOTE ER 250 MG 24 hr tablet Take 500 mg by mouth 2 (two) times daily.     Marland Kitchen docusate sodium (COLACE) 100 MG capsule Take 100 mg by mouth 2 (two)  times daily.     . haloperidol decanoate (HALDOL DECANOATE) 50 MG/ML injection Inject 50 mg into the muscle every 28 (twenty-eight) days.     Marland Kitchen loratadine (CLARITIN) 10 MG tablet Take 10 mg by mouth every morning.     Marland Kitchen LORazepam (ATIVAN) 1 MG tablet Take 1 mg by mouth every 4 (four) hours as needed for anxiety.     . metFORMIN (GLUCOPHAGE) 500 MG tablet Take 500 mg by mouth 2 (two) times daily.     Marland Kitchen OLANZapine (ZYPREXA) 15 MG tablet Take 30 mg by mouth at bedtime.     . pantoprazole (PROTONIX) 40 MG tablet Take 40 mg by mouth every morning.     . sertraline (ZOLOFT) 50 MG tablet Take 50 mg by mouth daily.       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   Physician Treatment Plan for Primary Diagnosis: Schizophrenia [facility]) Long Term Goal(s): Improvement in symptoms so as ready for discharge Improvement in symptoms so as ready for discharge   Short Term  Goals: Ability to identify changes in lifestyle to reduce recurrence of condition will improve Ability to verbalize feelings will improve Ability to disclose and discuss suicidal ideas Ability to demonstrate self-control will improve Ability to identify and develop effective coping behaviors will improve Ability to maintain clinical measurements within normal limits will improve Compliance with prescribed medications will improve Ability to identify changes in lifestyle to reduce recurrence of condition will improve Ability to verbalize feelings will improve Ability to disclose and discuss suicidal ideas Ability to demonstrate self-control will improve Ability to identify and develop effective coping behaviors will improve Ability to maintain clinical measurements within normal limits will  improve Compliance with prescribed medications will improve  Medication Management: Evaluate patient's response, side effects, and tolerance of medication regimen.  Therapeutic Interventions: 1 to 1 sessions, Unit Group sessions and Medication administration.  Evaluation of Outcomes: Not Met  Physician Treatment Plan for Secondary Diagnosis: Principal Problem:   Schizophrenia ([place]) Active Problems:   Tobacco use disorder   Diabetes ([place])   HTN (hypertension)   Dyslipidemia   Anxiety state  Long Term Goal(s): Improvement in symptoms so as ready for discharge Improvement in symptoms so as ready for discharge   Short Term Goals: Ability to identify changes in lifestyle to reduce recurrence of condition will improve Ability to verbalize feelings will improve Ability to disclose and discuss suicidal ideas Ability to demonstrate self-control will improve Ability to identify and develop effective coping behaviors will improve Ability to maintain clinical measurements within normal limits will improve Compliance with prescribed medications will improve Ability to identify changes in lifestyle to reduce recurrence of condition will improve Ability to verbalize feelings will improve Ability to disclose and discuss suicidal ideas Ability to demonstrate self-control will improve Ability to identify and develop effective coping behaviors will improve Ability to maintain clinical measurements within normal limits will improve Compliance with prescribed medications will improve     Medication Management: Evaluate patient's response, side effects, and tolerance of medication regimen.  Therapeutic Interventions: 1 to 1 sessions, Unit Group sessions and Medication administration.  Evaluation of Outcomes: Not Met   RN Treatment Plan for Primary Diagnosis: Schizophrenia ([place]) Long Term Goal(s): Knowledge of disease and therapeutic regimen to maintain health will improve  Short Term Goals:  Ability to remain free from injury will improve, Ability to verbalize frustration and anger appropriately will improve, Ability to demonstrate self-control, Ability to verbalize feelings will improve and Ability to identify and develop effective coping behaviors will improve  Medication Management: RN will administer medications as ordered by provider, will assess and evaluate patient's response and provide education to patient for prescribed medication. RN will report any adverse and/or side effects to prescribing provider.  Therapeutic Interventions: 1 on 1 counseling sessions, Psychoeducation, Medication administration, Evaluate responses to treatment, Monitor vital signs and CBGs as ordered, Perform/monitor CIWA, COWS, AIMS and Fall Risk screenings as ordered, Perform wound care treatments as ordered.  Evaluation of Outcomes: Not Met   LCSW Treatment Plan for Primary Diagnosis: Schizophrenia ([place]) Long Term Goal(s): Safe transition to appropriate next level of care at discharge, Engage patient in therapeutic group addressing interpersonal concerns.  Short Term Goals: Engage patient in aftercare planning with referrals and resources, Increase social support, Increase ability to appropriately verbalize feelings, Increase emotional regulation, Facilitate acceptance of mental health diagnosis and concerns, Identify triggers associated with mental health/substance abuse issues and Increase skills for wellness and recovery  Therapeutic Interventions: Assess for all discharge needs, 1 to 1 time with  Social worker, Explore available resources and support systems, Assess for adequacy in community support network, Educate family and significant other(s) on suicide prevention, Complete Psychosocial Assessment, Interpersonal group therapy.  Evaluation of Outcomes: Not Met   Progress in Treatment: Attending groups: Yes. Participating in groups: Yes. Taking medication as prescribed: Yes. Toleration  medication: Yes. Family/Significant other contact made: No, will contact:  pt declined Patient understands diagnosis: Yes. Discussing patient identified problems/goals with staff: Yes. Medical problems stabilized or resolved: Yes. Denies suicidal/homicidal ideation: Yes. Issues/concerns per patient self-inventory: No. Other:   New problem(s) identified: No, Describe:  No issues identified   New Short Term/Long Term Goal(s): Engage patient in aftercare planning with referrals and resources, Increase social support, Increase ability to appropriately verbalize feelings, Increase emotional regulation, Facilitate acceptance of mental health diagnosis and concerns, Identify triggers associated with mental health/substance abuse issues and Increase skills for wellness and recovery  Patient Goals:  "Go back to my group home." Pt states that he is also interested in getting back on a particular medication  Discharge Plan or Barriers: CSW will assist pt in getting transportation to his group home. CSW will assist pt in obtaining follow-up mental health care.   Reason for Continuation of Hospitalization: Medical Issues Medication stabilization  Estimated Length of Stay: 1- 7 days  Recreational Therapy: Patient Stressors: N/A Patient Goal: Patient will engage in groups without prompting or encouragement from LRT x3 group sessions within 5 recreation therapy group sessions.  Attendees: Patient: John Copeland 07/11/2020 9:28 AM  Physician: Salley Scarlet, MD 07/11/2020 9:28 AM  Nursing:  07/11/2020 9:28 AM  RN Care Manager: Marla Roe, RN  07/11/2020 9:28 AM  Social Worker: Assunta Curtis, MSW, LCSW 07/11/2020 9:28 AM  Recreational Therapist: Roanna Epley, Reather Converse, LRT 07/11/2020 9:28 AM  Other: Kiva Martinique, MSW, LCSW-A 07/11/2020 9:28 AM  Other:  07/11/2020 9:28 AM  Other: 07/11/2020 9:28 AM    Scribe for Treatment Team: Kiva A [place], [place] 07/11/2020 9:28 AM
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
## 2020-07-11 NOTE — Progress Notes (Signed)
Recreation Therapy Notes  INPATIENT RECREATION TR PLAN  Patient Details Name: John Copeland MRN: [id number] DOB: December 14, 1990 Today's Date: 07/11/2020  Rec Therapy Plan Is patient appropriate for Therapeutic Recreation?: Yes Treatment times per week: at least 3 Estimated Length of Stay: 5-7 days TR Treatment/Interventions: Group participation (Comment)  Discharge Criteria Pt will be discharged from therapy if:: Discharged Treatment plan/goals/alternatives discussed and agreed upon by:: Patient/family  Discharge Summary     [name] 07/11/2020, 2:45 PM

## 2020-07-11 NOTE — [facility] Group Notes (Signed)
[facility] Group Notes:  (Nursing/MHT/Case Management/Adjunct)  Date:  07/11/2020  Time:  8:56 PM  Type of Therapy:  Group Therapy  Participation Level:  Active  Participation Quality:  Appropriate  Affect:  Appropriate  Cognitive:  Alert  Insight:  Good  Engagement in Group:  Engaged and goal was to talk to mom.  Modes of Intervention:  Support  Summary of Progress/Problems:  John Copeland 07/11/2020, 8:56 PM

## 2020-07-11 NOTE — [facility] Group Notes (Signed)
LCSW Group Therapy Note     07/11/2020 2:19 PM     Type of Therapy and Topic:  Group Therapy:  Overcoming Obstacles     Participation Level:  Minimal     Description of Group:     In this group patients will be encouraged to explore what they see as obstacles to their own wellness and recovery. They will be guided to discuss their thoughts, feelings, and behaviors related to these obstacles. The group will process together ways to cope with barriers, with attention given to specific choices patients can make. Each patient will be challenged to identify changes they are motivated to make in order to overcome their obstacles. This group will be process-oriented, with patients participating in exploration of their own experiences as well as giving and receiving support and challenge from other group members.     Therapeutic Goals:  1.    Patient will identify personal and current obstacles as they relate to admission.  2.    Patient will identify barriers that currently interfere with their wellness or overcoming obstacles.  3.    Patient will identify feelings, thought process and behaviors related to these barriers.  4.    Patient will identify two changes they are willing to make to overcome these obstacles:        Summary of Patient Progress: Pt was very agitated during group. Pt yelled angrily and cursed at CSW team for asking him to wear a mask. The nurse tech talked with pt and pt was removed pt from group. Pt attempted to return to group again, with a calmer demeanor, but it had ended.      Therapeutic Modalities:    Cognitive Behavioral Therapy  Solution Focused Therapy  Motivational Interviewing  Relapse Prevention Therapy     Kiva [place], MSW, LCSW-A  07/11/2020 2:19 PM

## 2020-07-11 NOTE — Plan of Care (Signed)
  Problem: Education: Goal: Knowledge of [facility] Education information/materials will improve Outcome: Progressing Goal: Emotional status will improve Outcome: Progressing Goal: Mental status will improve Outcome: Progressing Goal: Verbalization of understanding the information provided will improve Outcome: Progressing   Problem: Activity: Goal: Interest or engagement in activities will improve Outcome: Progressing Goal: Sleeping patterns will improve Outcome: Progressing   Problem: Coping: Goal: Ability to verbalize frustrations and anger appropriately will improve Outcome: Progressing Goal: Ability to demonstrate self-control will improve Outcome: Progressing   Problem: Health Behavior/Discharge Planning: Goal: Identification of resources available to assist in meeting health care needs will improve Outcome: Progressing Goal: Compliance with treatment plan for underlying cause of condition will improve Outcome: Progressing   Problem: Physical Regulation: Goal: Ability to maintain clinical measurements within normal limits will improve Outcome: Progressing   Problem: Safety: Goal: Periods of time without injury will increase Outcome: Progressing   Problem: Education: Goal: Ability to verbalize precipitating factors for violent behavior will improve Outcome: Progressing   Problem: Coping: Goal: Ability to verbalize frustrations and anger appropriately will improve Outcome: Progressing   Problem: Health Behavior/Discharge Planning: Goal: Ability to implement measures to prevent violent behavior in the future will improve Outcome: Progressing   Problem: Safety: Goal: Ability to demonstrate self-control will improve Outcome: Progressing Goal: Ability to redirect hostility and anger into socially appropriate behaviors will improve Outcome: Progressing   Problem: Education: Goal: Ability to state activities that reduce stress will improve Outcome:  Progressing   Problem: Coping: Goal: Ability to identify and develop effective coping behavior will improve Outcome: Progressing   Problem: Self-Concept: Goal: Ability to identify factors that promote anxiety will improve Outcome: Progressing Goal: Level of anxiety will decrease Outcome: Progressing Goal: Ability to modify response to factors that promote anxiety will improve Outcome: Progressing   

## 2020-07-11 NOTE — Progress Notes (Signed)
Recreation Therapy Notes   Date: 07/11/2020  Time: 9:30 am   Location: Craft room   Behavioral response: Appropriate  Intervention Topic: Self-esteem    Discussion/Intervention:  Group content today was focused on self-esteem. Patient defined self-esteem and where it comes form. The group described reasons self-esteem is important. Individuals stated things that impact self-esteem and positive ways to improve self-esteem. Patient went over the components of self-esteem. The group participated in the intervention where patients were able to use origami to identify positive ways to improve their self-esteem.  Clinical Observations/Feedback: Patient came to group and was focused on what peers and staff had to say about self-esteem.He defined self-esteem as the way you feel about yourself. Individual was social with staff while participating in the intervention. Jahlil Ziller LRT/CTRS          Shanira Tine 07/11/2020 11:06 AM

## 2020-07-11 NOTE — Progress Notes (Signed)
Patient has been out of room more, interacting more with staff and peers. Calm and cooperative. Denies SI, HI and AVH
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
## 2020-07-11 NOTE — Progress Notes (Signed)
Per request by [name], chaplain offered prayer, scripture reading, and blessing. He shared wanted prayer that his behavior would not [facility]. He likes the group home. His demeanor was more engaging when he was encouraged. His demeanor changed when he spoke of his fear of failing and having to return to the state hospital. John Copeland was open, pleasant, and engaged in appropriate conversation with Chaplain.     07/11/20 2000  Clinical Encounter Type  Visited With Patient  Visit Type Behavioral Health  Spiritual Encounters  Spiritual Needs Prayer

## 2020-07-11 NOTE — Progress Notes (Signed)
Recreation Therapy Notes  INPATIENT RECREATION THERAPY ASSESSMENT  Patient Details Name: John Copeland MRN: [id number] DOB: 10-04-1990 Today's Date: 07/11/2020       Information Obtained From: Patient (Refused. Patient stated "I do not want to talk")  Able to Participate in Assessment/Interview:    Patient Presentation: Resistant,Withdrawn  Reason for Admission (Per Patient):    Patient Stressors:    Coping Skills:      Leisure Interests (2+):     Frequency of Recreation/Participation:    Awareness of Community Resources:     [employer]:     Current Use:    If no, Barriers?:    Expressed Interest in [employer] Information:    [place] of Residence:     Patient Main Form of Transportation:    Patient Strengths:     Patient Identified Areas of Improvement:     Patient Goal for Hospitalization:     Current SI (including self-harm):     Current HI:     Current AVH:    Staff Intervention Plan:    Consent to Intern Participation:    Shaverence  Outlaw 07/11/2020, 2:40 PM

## 2020-07-12 MED ORDER — DIPHENHYDRAMINE HCL 25 MG PO CAPS
50.0000 mg | ORAL_CAPSULE | Freq: Four times a day (QID) | ORAL | Status: DC | PRN
Start: 2020-07-12 — End: 2020-07-14

## 2020-07-12 MED ORDER — LORAZEPAM 2 MG PO TABS: 2.0000 mg | ORAL_TABLET | Freq: Four times a day (QID) | ORAL | Status: DC | PRN

## 2020-07-12 MED ORDER — HALOPERIDOL 5 MG PO TABS: 5.0000 mg | ORAL_TABLET | Freq: Four times a day (QID) | ORAL | Status: DC | PRN

## 2020-07-12 MED ORDER — LORAZEPAM 2 MG/ML IJ SOLN: 2.0000 mg | Freq: Four times a day (QID) | INTRAMUSCULAR | Status: DC | PRN

## 2020-07-12 MED ORDER — HALOPERIDOL LACTATE 5 MG/ML IJ SOLN: 5.0000 mg | Freq: Four times a day (QID) | INTRAMUSCULAR | Status: DC | PRN

## 2020-07-12 MED ORDER — DIPHENHYDRAMINE HCL 50 MG/ML IJ SOLN: 50.0000 mg | Freq: Four times a day (QID) | INTRAMUSCULAR | Status: DC | PRN

## 2020-07-12 NOTE — Progress Notes (Signed)
Patient pleasant and cooperative. Noted in dayroom putting puzzle together and socializing with peers. Denies any SI, HI, AVH. Medication compliant. Pt refused trazodone, reports it makes him to drowsy in the mornings and keeps him from getting up. Reports he does not feel good when taking this medication. Pt currently in bed resting eyes closed in no distress. Writer request that patient take trazodone if he finds it difficult to fall and stay asleep. Pt verbalized understanding and remains safe on unit with q 15 min checks.

## 2020-07-12 NOTE — Plan of Care (Signed)

## 2020-07-12 NOTE — Progress Notes (Signed)
[facility] MD Progress Note  07/12/2020 11:50 AM John Copeland  MRN:  [id number] Subjective: "I'm okay"  Objective: Patient is seen, chart is reviewed.  Nurses report patient has continued to be compliant with medication and there have been no behavioral issues.  Patient seen one-on-one today. He denies any side effects from taking long-acting injectable yesterday. He speaks in short yes or no answers, but makes more eye contact than yesterday. He denies suicidal ideations, homicidal ideations, visual hallucinations, and auditory hallucinations. However, he continues to be seen responding to internal stimuli, and continues to be guarded on exam. He still desires to go back to his group home.    Principal Problem: Schizophrenia (HCC) Diagnosis: Principal Problem:   Schizophrenia (HCC) Active Problems:   Tobacco use disorder   Diabetes (HCC)   HTN (hypertension)   Dyslipidemia   Anxiety state  Total Time spent with patient: 35 minutes  Past Psychiatric History: See H&P  Past Medical History:  Past Medical History:  Diagnosis Date  . Anxiety   . Depression   . Diabetes mellitus without complication (HCC)   . Homelessness   . Hypertension   . Schizophrenia (HCC)    History reviewed. No pertinent surgical history. Family History:  Family History  Problem Relation Age of Onset  . Diabetes Other    Family Psychiatric  History: None reported Social History:  Social History   Substance and Sexual Activity  Alcohol Use No     Social History   Substance and Sexual Activity  Drug Use Yes  . Types: Marijuana   Comment: denies drug use    Social History   Socioeconomic History  . Marital status: Single    Spouse name: Not on file  . Number of children: Not on file  . Years of education: Not on file  . Highest education level: Not on file  Occupational History  . Not on file  Tobacco Use  . Smoking status: Former Smoker    Packs/day: 0.00    Types: Cigarettes  .  Smokeless tobacco: Never Used  Vaping Use  . Vaping Use: Never used  Substance and Sexual Activity  . Alcohol use: No  . Drug use: Yes    Types: Marijuana    Comment: denies drug use  . Sexual activity: Not Currently  Other Topics Concern  . Not on file  Social History Narrative  . Not on file   Social Determinants of Health   Financial Resource Strain: Not on file  Food Insecurity: Not on file  Transportation Needs: Not on file  Physical Activity: Not on file  Stress: Not on file  Social Connections: Not on file   Additional Social History:             Patient currently living at [facility], Lenny Pastel administrator [phone]) He has only been at this facility for a short time.  Uncertain at this time if he is welcome to return.            Sleep: Good  Appetite:  Good  Current Medications: Current Facility-Administered Medications  Medication Dose Route Frequency Provider Last Rate Last Admin  . acetaminophen (TYLENOL) tablet 650 mg  650 mg Oral Q6H PRN Clapacs, John T, MD      . alum & mag hydroxide-simeth (MAALOX/MYLANTA) 200-200-20 MG/5ML suspension 30 mL  30 mL Oral Q4H PRN Clapacs, John T, MD      . amLODipine (NORVASC) tablet 5 mg  5 mg Oral Daily  Jesse Sans, MD   5 mg at 07/12/20 0751  . ARIPiprazole (ABILIFY) tablet 30 mg  30 mg Oral Daily Mariel Craft, MD   30 mg at 07/12/20 0750  . ARIPiprazole ER (ABILIFY MAINTENA) injection 400 mg  400 mg Intramuscular Q28 days Jesse Sans, MD   400 mg at 07/11/20 1517  . atomoxetine (STRATTERA) capsule 20 mg  20 mg Oral Daily Jesse Sans, MD   20 mg at 07/12/20 9678  . docusate sodium (COLACE) capsule 100 mg  100 mg Oral BID PRN Mariel Craft, MD      . hydrOXYzine (ATARAX/VISTARIL) tablet 50 mg  50 mg Oral Q6H PRN Clapacs, John T, MD      . magnesium hydroxide (MILK OF MAGNESIA) suspension 30 mL  30 mL Oral Daily PRN Clapacs, John T, MD      . metFORMIN (GLUCOPHAGE-XR) 24 hr tablet  500 mg  500 mg Oral Q breakfast Mariel Craft, MD   500 mg at 07/12/20 0751  . pantoprazole (PROTONIX) EC tablet 20 mg  20 mg Oral Daily Jesse Sans, MD   20 mg at 07/12/20 0751  . traZODone (DESYREL) tablet 100 mg  100 mg Oral QHS Mariel Craft, MD   100 mg at 07/10/20 2107    Lab Results:  No results found for this or any previous visit (from the past 48 hour(s)).  Blood Alcohol level:  Lab Results  Component Value Date   ETH <10 07/07/2020   ETH <10 06/23/2020    Metabolic Disorder Labs: Lab Results  Component Value Date   HGBA1C 5.6 07/07/2020   MPG 114.02 07/07/2020   MPG 119.76 01/19/2017   No results found for: PROLACTIN Lab Results  Component Value Date   CHOL 178 07/08/2020   TRIG 69 07/08/2020   HDL 52 07/08/2020   CHOLHDL 3.4 07/08/2020   VLDL 14 07/08/2020   LDLCALC 112 (H) 07/08/2020   LDLCALC 113 (H) 07/07/2020    Physical Findings: AIMS:  , ,  ,  ,    CIWA:  CIWA-Ar Total: 0 COWS:     Musculoskeletal: Strength & Muscle Tone: within normal limits Gait & Station: normal Patient leans: N/A  Psychiatric Specialty Exam: Physical Exam Vitals and nursing note reviewed.  Constitutional:      Appearance: Normal appearance.  HENT:     Head: Normocephalic and atraumatic.  Eyes:     Extraocular Movements: Extraocular movements intact.  Cardiovascular:     Rate and Rhythm: Normal rate.  Pulmonary:     Effort: Pulmonary effort is normal. No respiratory distress.  Musculoskeletal:        General: Normal range of motion.     Cervical back: Normal range of motion.  Neurological:     General: No focal deficit present.     Mental Status: He is alert and oriented to person, place, and time.     Review of Systems  Constitutional: Negative.   Respiratory: Negative.   Cardiovascular: Negative.   Gastrointestinal: Negative.   Musculoskeletal: Negative.   Neurological: Negative.   Psychiatric/Behavioral: Positive for dysphoric mood. Negative  for agitation, behavioral problems, confusion, decreased concentration, hallucinations, self-injury, sleep disturbance and suicidal ideas. The patient is nervous/anxious. The patient is not hyperactive.     Blood pressure (!) 141/72, pulse 65, temperature 97.8 F (36.6 C), temperature source Oral, resp. rate 18, height 6' 0.05" (1.83 m), weight 88 kg, SpO2 100 %.Body mass index is 26.28 kg/m.  General Appearance: Casual and Neat  Eye Contact:  Fair  Speech:  Clear and Coherent and Normal Rate  Volume:  Normal  Mood:  Anxious and Dysphoric  Affect:  Congruent and Constricted  Thought Process:  Goal Directed and Descriptions of Associations: Intact  Orientation:  Full (Time, Place, and Person)  Thought Content:  Logical and Hallucinations: None, still appears to be responding to internal stimuli  Suicidal Thoughts:  No  Homicidal Thoughts:  No  Memory:  Immediate;   Fair Recent;   Fair Remote;   Fair  Judgement:  Fair  Insight:  Shallow  Psychomotor Activity:  Normal  Concentration:  Concentration: Good and Attention Span: Good  Recall:  [employer] of Knowledge:  Fair  Language:  Good  Akathisia:  No  Handed:  Right  AIMS (if indicated):     Assets:  Communication Skills Desire for Improvement Physical Health  ADL's:  Intact  Cognition:  WNL  Sleep:  Number of Hours: 4.5     Treatment Plan Summary: Daily contact with patient to assess and evaluate symptoms and progress in treatment and Medication management   Assessment:  John Copeland is a 30 y.o. male with schizophrenia, anxiety with schizophrenia, anxiety and medication noncompliance.   He is tolerating Abilify without side effect and is denying any SI, HI, AVH.  He states that he is willing to continue the treatment plan as discussed with 4 pills in the morning and 1 pill at night. He also agreed to Abilify Maintenna long-acting injectable yesterday.    Diagnosis: Schizophrenia (HCC)    Pertinent findings today:  Patient is calm and cooperative.  He is able to engage in medication reconciliation and discharge planning.  He is able to apologize to staff at his ALF, and is hopeful to return there after discharge.     Treatment Plan Summary: Daily contact with patient to assess and evaluate symptoms and progress in treatment.   Scheduled medications/labs: -Abilify 30 mg daily for schizophrenia -- Abilify maintenna LAI 400 mg IM injection today -Glucophage XR 500 mg daily with breakfast for diabetes -Protonix 20 mg daily for reflux -Norvasc 5 mg daily for hypertension -Trazodone 100 mg at bedtime for sleep    PRN's : -Hydroxyzine 50 mg every 6 hours as needed for anxiety -Tylenol 650 mg every 6 hours as needed for pain -Colace 100 mg twice daily as needed for constipation -GI protocol in place as needed       Psychosocial:   1. Encouragement to attend group therapies. 2. Encourage patient to learn and implement coping strategies to avoid need for PRN medications. 3. Encouragement for medication compliance. 4. Disposition in progress, appreciate social work assistance.   5. Patient agreeable to meeting with ALF if indicated    Develop treatment plan to decrease the need for readmission.  Psycho-social education regarding self care.  Health care follow up as needed for medical problems.  Review, reconcile, and reinstate any pertinent home medications for other health issues where appropriate. Call for consults with hospitalist for any additional specialty patient care services as needed  07/12/20: Psychiatric exam above reviewed and remains accurate. Assessment and plan above reviewed and updated.     Jesse Sans, MD 07/12/2020, 11:50 AM

## 2020-07-12 NOTE — Progress Notes (Signed)
Patient presents as responding to internal stimuli but denies SI/HI/AVH. Patient observed interacting appropriately with staff and peers on the unit. Patient refused his night medication, (See MAR). Patient given education, he still refused. Patient given support and encouragement to be active in his treatment plan. Patient being monitored Q 15 minutes for safety per unit protocol. Pt remains safe on the unit.

## 2020-07-12 NOTE — [facility] Group Notes (Signed)
[facility] Group Notes:  (Nursing/MHT/Case Management/Adjunct)  Date:  07/12/2020  Time:  9:13 AM  Type of Therapy:  Community Meeting  Participation Level:  Minimal  Participation Quality:  Appropriate  Affect:  Flat  Cognitive:  Alert  Insight:  Limited  Engagement in Group:  Poor  Modes of Intervention:  Discussion, Education and Support  Summary of Progress/Problems:  Lynelle Smoke Geovannie Vilar 07/12/2020, 9:13 AM

## 2020-07-12 NOTE — Progress Notes (Signed)
Recreation Therapy Notes  Date: 07/12/2020   Time: 9:30 am   Location: Craft room     Behavioral response: N/A   Intervention Topic: Relaxation  Discussion/Intervention: Patient did not attend group.   Clinical Observations/Feedback:  Patient did not attend group.   Cova Knieriem LRT/CTRS        Emmit Oriley 07/12/2020 4:17 PM

## 2020-07-12 NOTE — [facility] Group Notes (Signed)
LCSW Group Therapy Note  07/12/2020 2:17 PM  Type of Therapy/Topic:  Group Therapy:  Balance in Life  Participation Level:  Active  Description of Group:    This group will address the concept of balance and how it feels and looks when one is unbalanced. Patients will be encouraged to process areas in their lives that are out of balance and identify reasons for remaining unbalanced. Facilitators will guide patients in utilizing problem-solving interventions to address and correct the stressor making their life unbalanced. Understanding and applying boundaries will be explored and addressed for obtaining and maintaining a balanced life. Patients will be encouraged to explore ways to assertively make their unbalanced needs known to significant others in their lives, using other group members and facilitator for support and feedback.  Therapeutic Goals: 1. Patient will identify two or more emotions or situations they have that consume much of in their lives. 2. Patient will identify signs/triggers that life has become out of balance:  3. Patient will identify two ways to set boundaries in order to achieve balance in their lives:  4. Patient will demonstrate ability to communicate their needs through discussion and/or role plays  Summary of Patient Progress: Patient was present in group.  Patient was an active participant in group.  Patient shared that he enjoys drawing and coloring as his ways of de-escalation.  He was supportive of other group members, attentive and engaged in group.   Therapeutic Modalities:   Cognitive Behavioral Therapy Solution-Focused Therapy Assertiveness Training  Penni Homans MSW, LCSW 07/12/2020 2:17 PM

## 2020-07-12 NOTE — Progress Notes (Signed)
Patient presents with a flat affect. Patient interaction is very minimal but will nod yes or no to assessment questions. Patient denies SI, HI, and AVH but appears to be responding to internal stimuli. Patient is less isolative and comes out for meals and for group.   Medications were given per MD orders. Patient is compliant with medications. Support and encouragement was provided. Patient remains safe on the unit at this time and q15 minute safety checks are maintained.

## 2020-07-12 NOTE — Plan of Care (Signed)
Patient presents responding to internal stimuli   Problem: Education: Goal: Emotional status will improve Outcome: Not Progressing Goal: Mental status will improve Outcome: Not Progressing   

## 2020-07-13 MED ORDER — ATOMOXETINE HCL 25 MG PO CAPS
25.0000 mg | ORAL_CAPSULE | Freq: Every day | ORAL | Status: DC
  Filled 2020-07-13: qty 1

## 2020-07-13 MED ORDER — NICOTINE POLACRILEX 2 MG MT GUM
2.0000 mg | CHEWING_GUM | OROMUCOSAL | Status: DC | PRN
  Administered 2020-07-13: 2 mg via ORAL
  Filled 2020-07-13: qty 1

## 2020-07-13 MED ORDER — GUANFACINE HCL ER 1 MG PO TB24
1.0000 mg | ORAL_TABLET | Freq: Every day | ORAL | Status: DC
  Administered 2020-07-13 – 2020-07-15 (×3): 1 mg via ORAL
  Filled 2020-07-13 (×3): qty 1

## 2020-07-13 NOTE — Progress Notes (Signed)
Recreation Therapy Notes   Date: 07/13/2020  Time: 9:30 am   Location: Craft room   Behavioral response: Withdrawn, Irritable, Restless   Intervention Topic: Leisure   Discussion/Intervention:  Group content today was focused on leisure. The group defined what leisure is and some positive leisure activities they participate in. Individuals identified the difference between good and bad leisure. Participants expressed how they feel after participating in the leisure of their choice. The group discussed how they go about picking a leisure activity and if others are involved in their leisure activities. The patient stated how many leisure activities they have to choose from and reasons why it is important to have leisure time. Individuals participated in the intervention "Exploration of Leisure" where they had a chance to identify new leisure activities as well as benefits of leisure. Clinical Observations/Feedback: Patient came to group and was distant from peers and staff. He became irritable when peers tried to hold a conversation with him. Participant walked in and out of group several times.  Briget Shaheed LRT/CTRS         Stephanie Littman 07/13/2020 12:48 PM

## 2020-07-13 NOTE — [facility] Counselor (Signed)
CSW spoke with the patient's ACTT team, [facility].  They report that the patient is new with them and has only been in service about  Weks.  She reports that the paitent baseline is hyperreligious, however, was unaware of the patient's past behaviors of making a priest collar and feeling a need to complete exorcisms.  She reports that about the 4th or 5th week working with the patient the team noticed that the patient had made a priest collar.  She reports that patient often dresses in dark clothing and wears collared shirts.  She also notes that pt's drug of choice is cough syrup.  Penni Homans, MSW, LCSW 07/13/2020 4:03 PM

## 2020-07-13 NOTE — Progress Notes (Signed)
[facility]  07/13/2020 12:18 PM Charmian MuffJoshua Omar Vankuren  MRN:  [id number] Subjective: "[facility]  Objective: Patient is seen, chart is reviewed.  Nurses report patient has continued to be compliant with medication, and he is attending to ADLS. Yesterday a patient started a verbal altercation with patient, but he was able to walk away and calm down without the need for PRN medication.   Patient seen one-on-one today. He denies any side effects from medications. He notes that he does not feel that Blase MessStraterra is helping, and asks to try an alternative agent for ADHD. Will start Intuniv at this time to assist with ADHD, impulsivity, and anger outbursts. This morning he was noted to have difficulty not knocking on doors to speak with staff. However, he does apologize for this. He also apologizes for outburst yesterday. He was encouraged to walk away and find staff if peer starts an argument with him again today.  He denies suicidal ideations, homicidal ideations, visual hallucinations, and auditory hallucinations. However, he continues to be seen responding to internal stimuli, and continues to be guarded on exam. He still desires to go back to his group home.   Contacted guardian again at [phone]: Voicemail left requesting call back.    Principal Problem: Schizophrenia (HCC) Diagnosis: Principal Problem:   Schizophrenia (HCC) Active Problems:   Tobacco use disorder   Diabetes (HCC)   HTN (hypertension)   Dyslipidemia   Anxiety state  Total Time spent with patient: 35 minutes  Past Psychiatric History: See H&P  Past Medical History:  Past Medical History:  Diagnosis Date  . Anxiety   . Depression   . Diabetes mellitus without complication (HCC)   . Homelessness   . Hypertension   . Schizophrenia (HCC)    History reviewed. No pertinent surgical history. Family History:  Family History  Problem Relation Age of Onset  . Diabetes Other    Family  Psychiatric  History: None reported Social History:  Social History   Substance and Sexual Activity  Alcohol Use No     Social History   Substance and Sexual Activity  Drug Use Yes  . Types: Marijuana   Comment: denies drug use    Social History   Socioeconomic History  . Marital status: Single    Spouse name: Not on file  . Number of children: Not on file  . Years of education: Not on file  . Highest education level: Not on file  Occupational History  . Not on file  Tobacco Use  . Smoking status: Former Smoker    Packs/day: 0.00    Types: Cigarettes  . Smokeless tobacco: Never Used  Vaping Use  . Vaping Use: Never used  Substance and Sexual Activity  . Alcohol use: No  . Drug use: Yes    Types: Marijuana    Comment: denies drug use  . Sexual activity: Not Currently  Other Topics Concern  . Not on file  Social History Narrative  . Not on file   Social Determinants of Health   Financial Resource Strain: Not on file  Food Insecurity: Not on file  Transportation Needs: Not on file  Physical Activity: Not on file  Stress: Not on file  Social Connections: Not on file   Additional Social History:             [facility] currently living at [facility], Lenny PastelMarcia Lee administrator [phone])  Sleep: Good  Appetite:  Good  Current Medications: Current Facility-Administered Medications  Medication Dose Route Frequency Provider Last Rate Last Admin  . acetaminophen (TYLENOL) tablet 650 mg  650 mg Oral Q6H PRN Clapacs, Jackquline Denmark, MD   650 mg at 07/12/20 2347  . alum & mag hydroxide-simeth (MAALOX/MYLANTA) 200-200-20 MG/5ML suspension 30 mL  30 mL Oral Q4H PRN Clapacs, Jackquline Denmark, MD   30 mL at 07/12/20 2347  . amLODipine (NORVASC) tablet 5 mg  5 mg Oral Daily Jesse Sans, MD   5 mg at 07/13/20 0835  . ARIPiprazole (ABILIFY) tablet 30 mg  30 mg Oral Daily Mariel Craft, MD   30 mg at 07/13/20 0835  . ARIPiprazole ER (ABILIFY MAINTENA)  injection 400 mg  400 mg Intramuscular Q28 days Jesse Sans, MD   400 mg at 07/11/20 1517  . haloperidol (HALDOL) tablet 5 mg  5 mg Oral Q6H PRN Jesse Sans, MD       And  . LORazepam (ATIVAN) tablet 2 mg  2 mg Oral Q6H PRN Jesse Sans, MD       And  . diphenhydrAMINE (BENADRYL) capsule 50 mg  50 mg Oral Q6H PRN Jesse Sans, MD      . haloperidol lactate (HALDOL) injection 5 mg  5 mg Intramuscular Q6H PRN Jesse Sans, MD       And  . LORazepam (ATIVAN) injection 2 mg  2 mg Intramuscular Q6H PRN Jesse Sans, MD       And  . diphenhydrAMINE (BENADRYL) injection 50 mg  50 mg Intramuscular Q6H PRN Jesse Sans, MD      . docusate sodium (COLACE) capsule 100 mg  100 mg Oral BID PRN Mariel Craft, MD      . guanFACINE (INTUNIV) ER tablet 1 mg  1 mg Oral Daily Jesse Sans, MD      . hydrOXYzine (ATARAX/VISTARIL) tablet 50 mg  50 mg Oral Q6H PRN Clapacs, John T, MD      . magnesium hydroxide (MILK OF MAGNESIA) suspension 30 mL  30 mL Oral Daily PRN Clapacs, John T, MD      . metFORMIN (GLUCOPHAGE-XR) 24 hr tablet 500 mg  500 mg Oral Q breakfast Mariel Craft, MD   500 mg at 07/13/20 4174  . pantoprazole (PROTONIX) EC tablet 20 mg  20 mg Oral Daily Jesse Sans, MD   20 mg at 07/13/20 0835  . traZODone (DESYREL) tablet 100 mg  100 mg Oral QHS Mariel Craft, MD   100 mg at 07/10/20 2107    Lab Results:  No results found for this or any previous visit (from the past 48 hour(s)).  Blood Alcohol level:  Lab Results  Component Value Date   ETH <10 07/07/2020   ETH <10 06/23/2020    Metabolic Disorder Labs: Lab Results  Component Value Date   HGBA1C 5.6 07/07/2020   MPG 114.02 07/07/2020   MPG 119.76 01/19/2017   No results found for: PROLACTIN Lab Results  Component Value Date   CHOL 178 07/08/2020   TRIG 69 07/08/2020   HDL 52 07/08/2020   CHOLHDL 3.4 07/08/2020   VLDL 14 07/08/2020   LDLCALC 112 (H) 07/08/2020   LDLCALC 113 (H)  07/07/2020    Physical Findings: AIMS:  , ,  ,  ,    CIWA:  CIWA-Ar Total: 0 COWS:     Musculoskeletal: Strength & Muscle Tone: within normal  limits Gait & Station: normal Patient leans: N/A  Psychiatric Specialty Exam: Physical Exam Vitals and nursing note reviewed.  Constitutional:      Appearance: Normal appearance.  HENT:     Head: Normocephalic and atraumatic.  Eyes:     Extraocular Movements: Extraocular movements intact.  Cardiovascular:     Rate and Rhythm: Normal rate.  Pulmonary:     Effort: Pulmonary effort is normal. No respiratory distress.  Musculoskeletal:        General: Normal range of motion.     Cervical back: Normal range of motion.  Neurological:     General: No focal deficit present.     Mental Status: He is alert and oriented to person, place, and time.     Review of Systems  Constitutional: Negative.   Respiratory: Negative.   Cardiovascular: Negative.   Gastrointestinal: Negative.   Musculoskeletal: Negative.   Neurological: Negative.   Psychiatric/Behavioral: Positive for dysphoric mood. Negative for agitation, behavioral problems, confusion, decreased concentration, hallucinations, self-injury, sleep disturbance and suicidal ideas. The patient is nervous/anxious. The patient is not hyperactive.     Blood pressure (!) 141/72, pulse 65, temperature 97.8 F (36.6 C), temperature source Oral, resp. rate 18, height 6' 0.05" (1.83 m), weight 88 kg, SpO2 100 %.Body mass index is 26.28 kg/m.  General Appearance: Casual and Neat  Eye Contact:  Fair  Speech:  Clear and Coherent and Normal Rate  Volume:  Normal  Mood:  Anxious and Dysphoric  Affect:  Congruent and Constricted  Thought Process:  Goal Directed and Descriptions of Associations: Intact  Orientation:  Full (Time, Place, and Person)  Thought Content:  Logical and Hallucinations: None, still appears to be responding to internal stimuli  Suicidal Thoughts:  No  Homicidal Thoughts:  No   Memory:  Immediate;   Fair Recent;   Fair Remote;   Fair  Judgement:  Fair  Insight:  Shallow  Psychomotor Activity:  Normal  Concentration:  Concentration: Good and Attention Span: Good  Recall:  [employer] of Knowledge:  Fair  Language:  Good  Akathisia:  No  Handed:  Right  AIMS (if indicated):     Assets:  Communication Skills Desire for Improvement Physical Health  ADL's:  Intact  Cognition:  WNL  Sleep:  Number of Hours: 6     Treatment Plan Summary: Daily contact with patient to assess and evaluate symptoms and progress in treatment and Medication management   Assessment:  Tori Cupps is a 30 y.o. male with schizophrenia, anxiety with schizophrenia, anxiety and medication noncompliance.   He is tolerating Abilify without side effect and is denying any SI, HI, AVH.  He states that he is willing to continue the treatment plan as discussed with 4 pills in the morning and 1 pill at night. He has also received Abilify Maintenna.    Diagnosis: Schizophrenia (HCC)    Pertinent findings today: Patient is calm and cooperative.  He is able to engage in medication reconciliation and discharge planning.  He is able to apologize to staff at his ALF, and is hopeful to return there after discharge.     Treatment Plan Summary: Daily contact with patient to assess and evaluate symptoms and progress in treatment.   Scheduled medications/labs: -Abilify 30 mg daily for schizophrenia -- Abilify maintenna LAI 400 mg IM injection given 2/28 -Glucophage XR 500 mg daily with breakfast for diabetes -- Start Intuniv 1 mg daily for concentration and impulsivity  -Protonix 20 mg daily  for reflux -Norvasc 5 mg daily for hypertension -Trazodone 100 mg at bedtime for sleep    PRN's : -Hydroxyzine 50 mg every 6 hours as needed for anxiety -Tylenol 650 mg every 6 hours as needed for pain -Colace 100 mg twice daily as needed for constipation -GI protocol in place as needed        Psychosocial:   1. Encouragement to attend group therapies. 2. Encourage patient to learn and implement coping strategies to avoid need for PRN medications. 3. Encouragement for medication compliance. 4. Disposition in progress, appreciate social work assistance.   5. Patient agreeable to meeting with ALF if indicated    Develop treatment plan to decrease the need for readmission.  Psycho-social education regarding self care.  Health care follow up as needed for medical problems.  Review, reconcile, and reinstate any pertinent home medications for other health issues where appropriate. Call for consults with hospitalist for any additional specialty patient care services as needed  07/13/20 Psychiatric exam above reviewed and remains accurate. Assessment and plan above reviewed and updated.      Jesse Sans, MD 07/13/2020, 12:18 PM

## 2020-07-13 NOTE — Plan of Care (Signed)
Pt denies depression, anxiety, SI, HI and AVH, Pt was educated on care plan and verbalizes understanding. John Mayers RN Problem: Education: Goal: Knowledge of [place] [facility] Education information/materials will improve Outcome: Progressing Goal: Emotional status will improve Outcome: Progressing Goal: Mental status will improve Outcome: Progressing Goal: Verbalization of understanding the information provided will improve Outcome: Progressing   Problem: Activity: Goal: Interest or engagement in activities will improve Outcome: Progressing Goal: Sleeping patterns will improve Outcome: Progressing   Problem: Coping: Goal: Ability to verbalize frustrations and anger appropriately will improve Outcome: Progressing Goal: Ability to demonstrate self-control will improve Outcome: Progressing   Problem: Health Behavior/Discharge Planning: Goal: Identification of resources available to assist in meeting health care needs will improve Outcome: Progressing Goal: Compliance with treatment plan for underlying cause of condition will improve Outcome: Progressing   Problem: Physical Regulation: Goal: Ability to maintain clinical measurements within normal limits will improve Outcome: Progressing   Problem: Safety: Goal: Periods of time without injury will increase Outcome: Progressing   Problem: Education: Goal: Ability to verbalize precipitating factors for violent behavior will improve Outcome: Progressing   Problem: Coping: Goal: Ability to verbalize frustrations and anger appropriately will improve Outcome: Progressing   Problem: Health Behavior/Discharge Planning: Goal: Ability to implement measures to prevent violent behavior in the future will improve Outcome: Progressing   Problem: Safety: Goal: Ability to demonstrate self-control will improve Outcome: Progressing Goal: Ability to redirect hostility and anger into socially appropriate behaviors will  improve Outcome: Progressing   Problem: Education: Goal: Ability to state activities that reduce stress will improve Outcome: Progressing   Problem: Coping: Goal: Ability to identify and develop effective coping behavior will improve Outcome: Progressing   Problem: Self-Concept: Goal: Ability to identify factors that promote anxiety will improve Outcome: Progressing Goal: Level of anxiety will decrease Outcome: Progressing Goal: Ability to modify response to factors that promote anxiety will improve Outcome: Progressing

## 2020-07-13 NOTE — Plan of Care (Signed)
Patient presents with paranoia   Problem: Education: Goal: Emotional status will improve Outcome: Not Progressing Goal: Mental status will improve Outcome: Not Progressing

## 2020-07-13 NOTE — Progress Notes (Signed)
Patient presents as responding to internal stimuli but denies SI/HI/AVH. Patient observed interacting appropriately with staff and peers on the unit. Patient refused his night medication, (See MAR). Patient given education, he still refused. Patient given support and encouragement to be active in his treatment plan. Patient being monitored Q 15 minutes for safety per unit protocol. Pt remains safe on the unit.  

## 2020-07-13 NOTE — Progress Notes (Signed)
Pt has been getting agitated because I am waiting to a medication from pharmacy for him and he is not trusting the process. I have called the pharmacy and gone down there. The medicine should be available later today. Pt was made aware and educated. .I also offered PRNs for agitation but he refused. Torrie Mayers RN

## 2020-07-13 NOTE — [facility] Group Notes (Signed)
LCSW Group Therapy Note  07/13/2020 2:09 PM  Type of Therapy/Topic:  Group Therapy:  Emotion Regulation  Participation Level:  Did Not Attend   Description of Group:   The purpose of this group is to assist patients in learning to regulate negative emotions and experience positive emotions. Patients will be guided to discuss ways in which they have been vulnerable to their negative emotions. These vulnerabilities will be juxtaposed with experiences of positive emotions or situations, and patients will be challenged to use positive emotions to combat negative ones. Special emphasis will be placed on coping with negative emotions in conflict situations, and patients will process healthy conflict resolution skills.  Therapeutic Goals: 1. Patient will identify two positive emotions or experiences to reflect on in order to balance out negative emotions 2. Patient will label two or more emotions that they find the most difficult to experience 3. Patient will demonstrate positive conflict resolution skills through discussion and/or role plays  Summary of Patient Progress: X  Therapeutic Modalities:   Cognitive Behavioral Therapy Feelings Identification Dialectical Behavioral Therapy  Nichele Slawson R. Rosezella Kronick, MSW, LCSW, LCAS 07/13/2020 2:09 PM   

## 2020-07-14 MED ORDER — HALOPERIDOL 5 MG PO TABS: 5.0000 mg | ORAL_TABLET | Freq: Four times a day (QID) | ORAL | Status: DC | PRN

## 2020-07-14 MED ORDER — DIPHENHYDRAMINE HCL 25 MG PO CAPS: 50.0000 mg | ORAL_CAPSULE | Freq: Four times a day (QID) | ORAL | Status: DC | PRN

## 2020-07-14 MED ORDER — HALOPERIDOL LACTATE 5 MG/ML IJ SOLN: 5.0000 mg | Freq: Four times a day (QID) | INTRAMUSCULAR | Status: DC | PRN

## 2020-07-14 MED ORDER — LORAZEPAM 2 MG PO TABS: 2.0000 mg | ORAL_TABLET | Freq: Four times a day (QID) | ORAL | Status: DC | PRN

## 2020-07-14 MED ORDER — DIPHENHYDRAMINE HCL 50 MG/ML IJ SOLN: 50.0000 mg | Freq: Four times a day (QID) | INTRAMUSCULAR | Status: DC | PRN

## 2020-07-14 MED ORDER — NICOTINE POLACRILEX 2 MG MT GUM
2.0000 mg | CHEWING_GUM | OROMUCOSAL | Status: DC | PRN
  Administered 2020-07-15 – 2020-07-19 (×9): 2 mg via ORAL
  Filled 2020-07-14 (×9): qty 1

## 2020-07-14 MED ORDER — LORAZEPAM 2 MG/ML IJ SOLN: 2.0000 mg | Freq: Four times a day (QID) | INTRAMUSCULAR | Status: DC | PRN

## 2020-07-14 NOTE — Progress Notes (Signed)
Pt became agitated and irritated with me today when I denied his request. "Fuck you ..." as he walked down the hall. MD was made aware and new PRNs ordered. Dr. Neale Burly spoke to him and he later apologized. Torrie Mayers RN

## 2020-07-14 NOTE — [facility] Group Notes (Signed)
LCSW Group Therapy Note     07/14/2020 2:47 PM     Type of Therapy/Topic:  Group Therapy:  Balance in Life     Participation Level:  Active     Description of Group:    This group will address the concept of balance and how it feels and looks when one is unbalanced. Patients will be encouraged to process areas in their lives that are out of balance and identify reasons for remaining unbalanced. Facilitators will guide patients in utilizing problem-solving interventions to address and correct the stressor making their life unbalanced. Understanding and applying boundaries will be explored and addressed for obtaining and maintaining a balanced life. Patients will be encouraged to explore ways to assertively make their unbalanced needs known to significant others in their lives, using other group members and facilitator for support and feedback.     Therapeutic Goals:  1.      Patient will identify two or more emotions or situations they have that consume much of in their lives.  2.      Patient will identify signs/triggers that life has become out of balance:  3.      Patient will identify two ways to set boundaries in order to achieve balance in their lives:  4.      Patient will demonstrate ability to communicate their needs through discussion and/or role plays     Summary of Patient Progress: CSW facilitated finding balance through thinking about future goals and opportunities. Pt did participate by drawing a picture of his future. Pt declined discussing what picture he drew but was still very active during the session.       Therapeutic Modalities:   Cognitive Behavioral Therapy  Solution-Focused Therapy  Assertiveness Training     Rhyder Koegel [place] MSW, LCSW-A  07/14/2020 2:47 PM

## 2020-07-14 NOTE — Progress Notes (Signed)
Recreation Therapy Notes    Date: 07/14/2020  Time: 9:30 am   Location: Court yard    Behavioral response: N/A   Intervention Topic: Social skills    Discussion/Intervention: Patient did not attend group.   Clinical Observations/Feedback:  Patient did not attend group.   Adil Tugwell LRT/CTRS        Dagoberto Nealy 07/14/2020 12:19 PM

## 2020-07-14 NOTE — Progress Notes (Signed)
Patient calm and pleasant during assessment denying SI/HI/AVH. Patient observed interacting appropriately with staff and peers on the unit. Patient refused his night medication, (See MAR). Patient given education, he still refused. Patient given support and encouragement to be active in his treatment plan. Patient being monitored Q 15 minutes for safety per unit protocol. Pt remains safe on the unit.

## 2020-07-14 NOTE — [facility] Group Notes (Signed)
[facility] Group Notes:  (Nursing/MHT/Case Management/Adjunct)  Date:  07/14/2020  Time:  8:49 PM  Type of Therapy:  wrap up  Participation Level:  Minimal  Participation Quality:  Appropriate  Affect:  Appropriate  Cognitive:  Appropriate  Insight:  Limited  Engagement in Group:  None  Modes of Intervention:  Support  Summary of Progress/Problems:  John Copeland 07/14/2020, 8:49 PM

## 2020-07-14 NOTE — Progress Notes (Signed)
Pt denies depression, anxiety, SI, HI and  AVH. Pt was educated on care plan and verbalizes understanding. Torrie Mayers Rn

## 2020-07-14 NOTE — Plan of Care (Signed)
Patient denies SI/HI/AVH  Problem: Education: Goal: Emotional status will improve Outcome: Progressing Goal: Mental status will improve Outcome: Progressing   

## 2020-07-14 NOTE — Progress Notes (Signed)
BRIEF PHARMACY NOTE   This patient attended and participated in Medication Management Group counseling led by [facility] staff pharmacist.  This interactive class reviews basic information about prescription medications and education on personal responsibility in medication management.  The class also includes general knowledge of 3 main classes of behavioral medications, including antipsychotics, antidepressants, and mood stabilizers.     Patient left early and was disengaged.   Educational materials sourced from:  "Medication Do's and Don'ts" from [email or web address]   "Mental Health Medications" from [facility] of Mental Health FaxRack.tn.shtml#part 620355    Albina Billet, PharmD, BCPS Clinical Pharmacist 07/14/2020 3:04 PM

## 2020-07-14 NOTE — Progress Notes (Signed)
Pt has had a blunted affect. Pt has been social, cooperative with his medications and attended groups. Pt has hopes to be discharged soon. Torrie Mayers RN
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
## 2020-07-14 NOTE — BHH Counselor (Signed)
CSW faxed FL2 to the patient's guardian.  Penni Homans, MSW, LCSW 07/14/2020 4:04 PM

## 2020-07-15 MED ORDER — BISMUTH SUBSALICYLATE 262 MG PO CHEW
524.0000 mg | CHEWABLE_TABLET | ORAL | Status: DC | PRN
  Administered 2020-07-16 – 2020-07-19 (×3): 524 mg via ORAL
  Filled 2020-07-15 (×5): qty 2

## 2020-07-15 NOTE — BHH Group Notes (Signed)
LCSW Group Therapy Note  07/15/2020 2:14 PM  Type of Therapy/Topic:  Group Therapy:  Emotion Regulation  Participation Level:  Did Not Attend   Description of Group:   The purpose of this group is to assist patients in learning to regulate negative emotions and experience positive emotions. Patients will be guided to discuss ways in which they have been vulnerable to their negative emotions. These vulnerabilities will be juxtaposed with experiences of positive emotions or situations, and patients will be challenged to use positive emotions to combat negative ones. Special emphasis will be placed on coping with negative emotions in conflict situations, and patients will process healthy conflict resolution skills.  Therapeutic Goals: 1. Patient will identify two positive emotions or experiences to reflect on in order to balance out negative emotions 2. Patient will label two or more emotions that they find the most difficult to experience 3. Patient will demonstrate positive conflict resolution skills through discussion and/or role plays  Summary of Patient Progress: X  Therapeutic Modalities:   Cognitive Behavioral Therapy Feelings Identification Dialectical Behavioral Therapy  Penni Homans, MSW, LCSW 07/15/2020 2:14 PM

## 2020-07-15 NOTE — Plan of Care (Signed)
Pt denies depression, anxiety, SI, HI and AVH. Pt was educated on care plan and verbalizes understanding. Torrie Mayers RN Problem: Education: Goal: Knowledge of [facility] Education information/materials will improve Outcome: Progressing Goal: Emotional status will improve Outcome: Progressing Goal: Mental status will improve Outcome: Progressing Goal: Verbalization of understanding the information provided will improve Outcome: Progressing   Problem: Activity: Goal: Interest or engagement in activities will improve Outcome: Progressing Goal: Sleeping patterns will improve Outcome: Progressing   Problem: Coping: Goal: Ability to verbalize frustrations and anger appropriately will improve Outcome: Progressing Goal: Ability to demonstrate self-control will improve Outcome: Progressing   Problem: Health Behavior/Discharge Planning: Goal: Identification of resources available to assist in meeting health care needs will improve Outcome: Progressing Goal: Compliance with treatment plan for underlying cause of condition will improve Outcome: Progressing   Problem: Physical Regulation: Goal: Ability to maintain clinical measurements within normal limits will improve Outcome: Progressing   Problem: Safety: Goal: Periods of time without injury will increase Outcome: Progressing   Problem: Education: Goal: Ability to verbalize precipitating factors for violent behavior will improve Outcome: Progressing   Problem: Coping: Goal: Ability to verbalize frustrations and anger appropriately will improve Outcome: Progressing   Problem: Health Behavior/Discharge Planning: Goal: Ability to implement measures to prevent violent behavior in the future will improve Outcome: Progressing   Problem: Safety: Goal: Ability to demonstrate self-control will improve Outcome: Progressing Goal: Ability to redirect hostility and anger into socially appropriate behaviors will  improve Outcome: Progressing   Problem: Education: Goal: Ability to state activities that reduce stress will improve Outcome: Progressing   Problem: Coping: Goal: Ability to identify and develop effective coping behavior will improve Outcome: Progressing   Problem: Self-Concept: Goal: Ability to identify factors that promote anxiety will improve Outcome: Progressing Goal: Level of anxiety will decrease Outcome: Progressing Goal: Ability to modify response to factors that promote anxiety will improve Outcome: Progressing

## 2020-07-15 NOTE — BHH Group Notes (Signed)
BHH Group Notes:  (Nursing/MHT/Case Management/Adjunct)  Date:  07/15/2020  Time:  8:48 PM  Type of Therapy:  Group Therapy  Participation Level:  Active  Participation Quality:  Appropriate  Affect:  Appropriate  Cognitive:  Alert  Insight:  Good  Engagement in Group:  Engaged and said he had a good day.  Modes of Intervention:  Support  Summary of Progress/Problems:  Mayra Neer 07/15/2020, 8:48 PM

## 2020-07-15 NOTE — Progress Notes (Signed)
Patient has been calm and cooperative. Isolative to self. Requested his Trazodone tonight. Denies SI, HI and AVH

## 2020-07-15 NOTE — BHH Counselor (Signed)
CSW spoke with pt's guardian.  He reports that patient has a bed in [place], [place].  He reports that the patient NOT know that he has a placement in [place]. Patient can know that he has a new bed but not the location.  He reports that he doesn't want patient's belongings to be returned to him at discharge but to be given to guardian.   He requested a document/letter/discahrge statement explaining that the patients medications have been altered.   Penni Homans, MSW, LCSW 07/15/2020 3:36 PM

## 2020-07-15 NOTE — Plan of Care (Signed)
  Problem: Education: Goal: Knowledge of [facility] Education information/materials will improve Outcome: Progressing Goal: Emotional status will improve Outcome: Progressing Goal: Mental status will improve Outcome: Progressing Goal: Verbalization of understanding the information provided will improve Outcome: Progressing   Problem: Activity: Goal: Interest or engagement in activities will improve Outcome: Progressing Goal: Sleeping patterns will improve Outcome: Progressing   Problem: Coping: Goal: Ability to verbalize frustrations and anger appropriately will improve Outcome: Progressing Goal: Ability to demonstrate self-control will improve Outcome: Progressing   Problem: Health Behavior/Discharge Planning: Goal: Identification of resources available to assist in meeting health care needs will improve Outcome: Progressing Goal: Compliance with treatment plan for underlying cause of condition will improve Outcome: Progressing   Problem: Physical Regulation: Goal: Ability to maintain clinical measurements within normal limits will improve Outcome: Progressing   Problem: Safety: Goal: Periods of time without injury will increase Outcome: Progressing   Problem: Education: Goal: Ability to verbalize precipitating factors for violent behavior will improve Outcome: Progressing   Problem: Coping: Goal: Ability to verbalize frustrations and anger appropriately will improve Outcome: Progressing   Problem: Health Behavior/Discharge Planning: Goal: Ability to implement measures to prevent violent behavior in the future will improve Outcome: Progressing   Problem: Safety: Goal: Ability to demonstrate self-control will improve Outcome: Progressing Goal: Ability to redirect hostility and anger into socially appropriate behaviors will improve Outcome: Progressing   Problem: Education: Goal: Ability to state activities that reduce stress will improve Outcome:  Progressing   Problem: Coping: Goal: Ability to identify and develop effective coping behavior will improve Outcome: Progressing   Problem: Self-Concept: Goal: Ability to identify factors that promote anxiety will improve Outcome: Progressing Goal: Level of anxiety will decrease Outcome: Progressing Goal: Ability to modify response to factors that promote anxiety will improve Outcome: Progressing   

## 2020-07-15 NOTE — Progress Notes (Signed)
Recreation Therapy Notes  Date: 07/15/2020  Time: 9:30 am   Location: Craft room   Behavioral response: Appropriate,Restless  Intervention Topic: Problem Solving   Discussion/Intervention:  Group content on today was focused on problem solving. The group described what problem solving is. Patients expressed how problems affect them and how they deal with problems. Individuals identified healthy ways to deal with problems. Patients explained what normally happens to them when they do not deal with problems. The group expressed reoccurring problems for them. The group participated in the intervention "Ways to Solve problems" where patients were given a chance to explore different ways to solve problems.  Clinical Observations/Feedback: Patient came to group late due to unknown reasons. He walked in and out of group several times due to unknown reasons.  Deiondra Denley LRT/CTRS         Twanna Resh 07/15/2020 12:03 PM

## 2020-07-15 NOTE — BHH Group Notes (Signed)
B[facility] Group Notes:  (Nursing/MHT/Case Management/Adjunct)  Date:  07/15/2020  Time:  9:37 AM  Type of Therapy:  Community Group  Participation Level:  Did Not Attend   Lynelle Smoke [facility] 07/15/2020, 9:37 AM

## 2020-07-15 NOTE — BHH Counselor (Signed)
CSW spoke with the patient's guardian, John Copeland [phone].  CSW asked about patient returning to the group home.  John Copeland reports that he has spoken with Leta Jungling with the group home who remains hesitant to have patient return. He reports plans to send the [facility] out to others "in case" the group home does not allow him to return.   He states group homes concerns are: "refusing meds and becoming angry when asked to take medications, he walks out of the home, wears a priest collar, says he is going to sacrifice others, taking cough med".  Guardian reports that the patient is somehow gaining access to cough medications and cough syrup which patient has a history of OD.  Penni Homans, MSW, LCSW 07/15/2020 10:46 AM

## 2020-07-15 NOTE — Progress Notes (Signed)
[facility] MD Progress Note  07/15/2020 12:58 PM John Copeland  MRN:  [id number] Subjective: "Do I have to go to [place]?"  Objective: Patient is seen, chart is reviewed.  Nurses report patient has continued to be compliant with medication, and he is attending to ADLS. No acute events overnight, no PRNs needed.  Patient seen one-on-one today. He had a period of agitation last night and again this afternoon, but was able to be redirected without needs for PRNs. He denies suicidal ideations, homicidal ideations, visual hallucinations, or auditory hallucinations. He denies any side effects to medications. He has had no behavioral outbursts or altercations.   Contacted [name], guardian, at [phone]: He notes that group home remains concerned about his access to cough syrup, and aggression in the home concerning medications. It remains unclear whether he can return. 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 He also promises to not take cough syrup, and to take his medications. Both parties willing to have outpatient commitment in place for him to follow-up with his ACT Team.      Principal Problem: Schizophrenia (HCC) Diagnosis: Principal Problem:   Schizophrenia (HCC) Active Problems:   Tobacco use disorder   Diabetes (HCC)   HTN (hypertension)   Dyslipidemia   Anxiety state  Total Time spent with patient: 35 minutes  Past Psychiatric History: See H&P  Past Medical History:  Past Medical History:  Diagnosis Date  . Anxiety   . Depression   . Diabetes mellitus without complication (HCC)   . Homelessness   . Hypertension   . Schizophrenia (HCC)    History reviewed. No pertinent surgical history. Family History:  Family History  Problem Relation Age of Onset  . Diabetes Other    Family Psychiatric  History: None reported Social History:  Social History   Substance and Sexual Activity  Alcohol Use No      Social History   Substance and Sexual Activity  Drug Use Yes  . Types: Marijuana   Comment: denies drug use    Social History   Socioeconomic History  . Marital status: Single    Spouse name: Not on file  . Number of children: Not on file  . Years of education: Not on file  . Highest education level: Not on file  Occupational History  . Not on file  Tobacco Use  . Smoking status: Former Smoker    Packs/day: 0.00    Types: Cigarettes  . Smokeless tobacco: Never Used  Vaping Use  . Vaping Use: Never used  Substance and Sexual Activity  . Alcohol use: No  . Drug use: Yes    Types: Marijuana    Comment: denies drug use  . Sexual activity: Not Currently  Other Topics Concern  . Not on file  Social History Narrative  . Not on file   Social Determinants of Health   Financial Resource Strain: Not on file  Food Insecurity: Not on file  Transportation Needs: Not on file  Physical Activity: Not on file  Stress: Not on file  Social Connections: Not on file   Additional Social History:             Patient currently living at [facility], Lenny Pastel administrator [phone])             Sleep: [facility]i [facility]  Appetite:  Good  Current Medications: Current Facility-Administered Medications  Medication Dose Route Frequency Provider  Last Rate Last Admin  . acetaminophen (TYLENOL) tablet 650 mg  650 mg Oral Q6H PRN Clapacs, Jackquline Denmark, MD   650 mg at 07/13/20 2204  . amLODipine (NORVASC) tablet 5 mg  5 mg Oral Daily Jesse Sans, MD   5 mg at 07/15/20 0748  . ARIPiprazole (ABILIFY) tablet 30 mg  30 mg Oral Daily Mariel Craft, MD   30 mg at 07/15/20 0749  . ARIPiprazole ER (ABILIFY MAINTENA) injection 400 mg  400 mg Intramuscular Q28 days Jesse Sans, MD   400 mg at 07/11/20 1517  . haloperidol (HALDOL) tablet 5 mg  5 mg Oral Q6H PRN Jesse Sans, MD       And  . LORazepam (ATIVAN) tablet 2 mg  2 mg Oral Q6H PRN Jesse Sans, MD       And  .  diphenhydrAMINE (BENADRYL) capsule 50 mg  50 mg Oral Q6H PRN Jesse Sans, MD      . haloperidol lactate (HALDOL) injection 5 mg  5 mg Intramuscular Q6H PRN Jesse Sans, MD       And  . LORazepam (ATIVAN) injection 2 mg  2 mg Intramuscular Q6H PRN Jesse Sans, MD       And  . diphenhydrAMINE (BENADRYL) injection 50 mg  50 mg Intramuscular Q6H PRN Jesse Sans, MD      . docusate sodium (COLACE) capsule 100 mg  100 mg Oral BID PRN Mariel Craft, MD      . guanFACINE (INTUNIV) ER tablet 1 mg  1 mg Oral Daily Jesse Sans, MD   1 mg at 07/15/20 0748  . metFORMIN (GLUCOPHAGE-XR) 24 hr tablet 500 mg  500 mg Oral Q breakfast Mariel Craft, MD   500 mg at 07/15/20 0748  . nicotine polacrilex (NICORETTE) gum 2 mg  2 mg Oral Q4H PRN Jesse Sans, MD   2 mg at 07/15/20 0750  . pantoprazole (PROTONIX) EC tablet 20 mg  20 mg Oral Daily Jesse Sans, MD   20 mg at 07/15/20 0748  . traZODone (DESYREL) tablet 100 mg  100 mg Oral QHS Mariel Craft, MD   100 mg at 07/10/20 2107    Lab Results:  No results found for this or any previous visit (from the past 48 hour(s)).  Blood Alcohol level:  Lab Results  Component Value Date   ETH <10 07/07/2020   ETH <10 06/23/2020    Metabolic Disorder Labs: Lab Results  Component Value Date   HGBA1C 5.6 07/07/2020   MPG 114.02 07/07/2020   MPG 119.76 01/19/2017   No results found for: PROLACTIN Lab Results  Component Value Date   CHOL 178 07/08/2020   TRIG 69 07/08/2020   HDL 52 07/08/2020   CHOLHDL 3.4 07/08/2020   VLDL 14 07/08/2020   LDLCALC 112 (H) 07/08/2020   LDLCALC 113 (H) 07/07/2020    Physical Findings: AIMS:  , ,  ,  ,    CIWA:  CIWA-Ar Total: 0 COWS:     Musculoskeletal: Strength & Muscle Tone: within normal limits Gait & Station: normal Patient leans: N/A  Psychiatric Specialty Exam:     Blood pressure 135/77, pulse 75, temperature 97.9 F (36.6 C), temperature source Oral, resp. rate 17,  height 6' 0.05" (1.83 m), weight 88 kg, SpO2 100 %.Body mass index is 26.28 kg/m.  General Appearance: Casual and Neat  Eye Contact:  Fair  Speech:  Clear and  Coherent and Normal Rate  Volume:  Normal  Mood:  Euthymic  Affect:  Congruent and Constricted  Thought Process:  Goal Directed and Descriptions of Associations: Intact  Orientation:  Full (Time, Place, and Person)  Thought Content:  Logical and Hallucinations: None  Suicidal Thoughts:  No  Homicidal Thoughts:  No  Memory:  Immediate;   Fair Recent;   Fair Remote;   Fair  Judgement:  Fair  Insight:  Shallow  Psychomotor Activity:  Normal  Concentration:  Concentration: Good and Attention Span: Good  Recall:  [employer] of Knowledge:  Fair  Language:  Good  Akathisia:  No  Handed:  Right  AIMS (if indicated):     Assets:  Communication Skills Desire for Improvement Physical Health  ADL's:  Intact  Cognition:  WNL  Sleep:  Number of Hours: 6.5     Treatment Plan Summary: Daily contact with patient to assess and evaluate symptoms and progress in treatment and Medication management   Assessment:  John Copeland is a 30 y.o. male with schizophrenia, anxiety with schizophrenia, anxiety and medication noncompliance.   He is tolerating Abilify without side effect and is denying any SI, HI, AVH.  He states that he is willing to continue the treatment plan as discussed. 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    Diagnosis: Schizophrenia [facility])  Treatment Plan Summary: Daily contact with patient to assess and evaluate symptoms and progress in treatment.   Scheduled medications/labs: -Abilify 30 mg daily for schizophrenia -- Abilify maintenna LAI 400 mg IM injection given 2/28 -Glucophage XR 500 mg daily with breakfast for diabetes -- Continue Intuniv 1 mg daily for concentration and impulsivity  -Protonix 20 mg daily for reflux -Norvasc 5 mg daily for  hypertension -Trazodone 100 mg at bedtime for sleep    PRN's : -Hydroxyzine 50 mg every 6 hours as needed for anxiety -Tylenol 650 mg every 6 hours as needed for pain -Colace 100 mg twice daily as needed for constipation -GI protocol in place as needed  07/15/20: Psychiatric exam above reviewed and remains accurate. Assessment and plan above reviewed and updated.    Jesse Sans, MD 07/15/2020, 12:58 PM

## 2020-07-15 NOTE — Progress Notes (Signed)
Pt has become agitated. He was arguing with MHTs. MD aware and suggested that he go back to his room. He is safe and resting. Torrie Mayers RN

## 2020-07-16 DIAGNOSIS — F209 Schizophrenia, unspecified: Principal | ICD-10-CM

## 2020-07-16 NOTE — [facility] Group Notes (Signed)
[facility] LCSW Group Therapy Note  Date/Time:  07/16/2020 1:04-1:45 PM   Type of Therapy and Topic:  Group Therapy:  Healthy and Unhealthy Supports  Participation Level:  None   Description of Group:  Patients in this group were introduced to the idea of adding a variety of healthy supports to address the various needs in their lives.Patients discussed what additional healthy supports could be helpful in their recovery and wellness after discharge in order to prevent future hospitalizations.   An emphasis was placed on using counselor, doctor, therapy groups, 12-step groups, and problem-specific support groups to expand supports.  They also worked as a group on developing a specific plan for several patients to deal with unhealthy supports through boundary-setting, psychoeducation with loved ones, and even termination of relationships.   Therapeutic Goals:   1)  discuss importance of adding supports to stay well once out of the hospital  2)  compare healthy versus unhealthy supports and identify some examples of each  3)  generate ideas and descriptions of healthy supports that can be added  4)  offer mutual support about how to address unhealthy supports  5)  encourage active participation in and adherence to discharge plan    Summary of Patient Progress: Patient came to group but was working on his picture. Patient did not participate.   Therapeutic Modalities:   Motivational Interviewing Brief Solution-Focused Therapy  Susa Simmonds, Theresia Majors 07/16/2020  3:08 PM

## 2020-07-16 NOTE — Plan of Care (Signed)
Patient appropriate with staff & peers. Denies SI,HI and AVH. Patient attended groups.ADLs maintained. No aggressive behaviors noted. Compliant with medications. Appetite and energy level good. Support and encouragement given.

## 2020-07-16 NOTE — Tx Team (Cosign Needed)
Interdisciplinary Treatment and Diagnostic Plan Update  07/16/2020 Time of Session: 11:40 AM  Cyrus Ramsburg MRN: [id number]  Principal Diagnosis: Schizophrenia [facility])  Secondary Diagnoses: Principal Problem:   Schizophrenia (HCC) Active Problems:   Tobacco use disorder   Diabetes (HCC)   HTN (hypertension)   Dyslipidemia   Anxiety state   Current Medications:  Current Facility-Administered Medications  Medication Dose Route Frequency Provider Last Rate Last Admin  . acetaminophen (TYLENOL) tablet 650 mg  650 mg Oral Q6H PRN Clapacs, Jackquline Denmark, MD   650 mg at 07/13/20 2204  . amLODipine (NORVASC) tablet 5 mg  5 mg Oral Daily Jesse Sans, MD   5 mg at 07/16/20 0347  . ARIPiprazole (ABILIFY) tablet 30 mg  30 mg Oral Daily Mariel Craft, MD   30 mg at 07/16/20 4259  . ARIPiprazole ER (ABILIFY MAINTENA) injection 400 mg  400 mg Intramuscular Q28 days Jesse Sans, MD   400 mg at 07/11/20 1517  . bismuth subsalicylate (PEPTO BISMOL) chewable tablet 524 mg  524 mg Oral Q1H PRN Jesse Sans, MD      . haloperidol (HALDOL) tablet 5 mg  5 mg Oral Q6H PRN Jesse Sans, MD       And  . LORazepam (ATIVAN) tablet 2 mg  2 mg Oral Q6H PRN Jesse Sans, MD       And  . diphenhydrAMINE (BENADRYL) capsule 50 mg  50 mg Oral Q6H PRN Jesse Sans, MD      . haloperidol lactate (HALDOL) injection 5 mg  5 mg Intramuscular Q6H PRN Jesse Sans, MD       And  . LORazepam (ATIVAN) injection 2 mg  2 mg Intramuscular Q6H PRN Jesse Sans, MD       And  . diphenhydrAMINE (BENADRYL) injection 50 mg  50 mg Intramuscular Q6H PRN Jesse Sans, MD      . docusate sodium (COLACE) capsule 100 mg  100 mg Oral BID PRN Mariel Craft, MD      . metFORMIN (GLUCOPHAGE-XR) 24 hr tablet 500 mg  500 mg Oral Q breakfast Mariel Craft, MD   500 mg at 07/16/20 5638  . nicotine polacrilex (NICORETTE) gum 2 mg  2 mg Oral Q4H PRN Jesse Sans, MD   2 mg at 07/15/20 1544  .  pantoprazole (PROTONIX) EC tablet 20 mg  20 mg Oral Daily Jesse Sans, MD   20 mg at 07/16/20 7564  . traZODone (DESYREL) tablet 100 mg  100 mg Oral QHS Mariel Craft, MD   100 mg at 07/15/20 2134   PTA Medications: Medications Prior to Admission  Medication Sig Dispense Refill Last Dose  . benztropine (COGENTIN) 1 MG tablet Take 1 mg by mouth 2 (two) times daily.     . Cholecalciferol (VITAMIN D3) 25 MCG (1000 UT) CAPS Take 2,000 Units by mouth daily.     Marland Kitchen DEPAKOTE ER 250 MG 24 hr tablet Take 500 mg by mouth 2 (two) times daily.     Marland Kitchen docusate sodium (COLACE) 100 MG capsule Take 100 mg by mouth 2 (two) times daily.     . haloperidol decanoate (HALDOL DECANOATE) 50 MG/ML injection Inject 50 mg into the muscle every 28 (twenty-eight) days.     Marland Kitchen loratadine (CLARITIN) 10 MG tablet Take 10 mg by mouth every morning.     Marland Kitchen LORazepam (ATIVAN) 1 MG tablet Take 1 mg by mouth every 4 (  four) hours as needed for anxiety.     . metFORMIN (GLUCOPHAGE) 500 MG tablet Take 500 mg by mouth 2 (two) times daily.     Marland Kitchen OLANZapine (ZYPREXA) 15 MG tablet Take 30 mg by mouth at bedtime.     . pantoprazole (PROTONIX) 40 MG tablet Take 40 mg by mouth every morning.     . sertraline (ZOLOFT) 50 MG tablet Take 50 mg by mouth daily.       Patient Stressors: Medication change or noncompliance  Patient Strengths: Manufacturing systems engineer Physical Health Supportive family/friends  Treatment Modalities: Medication Management, Group therapy, Case management,  1 to 1 session with clinician, Psychoeducation, Recreational therapy.   Physician Treatment Plan for Primary Diagnosis: Schizophrenia (HCC) Long Term Goal(s): Improvement in symptoms so as ready for discharge Improvement in symptoms so as ready for discharge   Short Term Goals: Ability to identify changes in lifestyle to reduce recurrence of condition will improve Ability to verbalize feelings will improve Ability to disclose and discuss suicidal  ideas Ability to demonstrate self-control will improve Ability to identify and develop effective coping behaviors will improve Ability to maintain clinical measurements within normal limits will improve Compliance with prescribed medications will improve Ability to identify changes in lifestyle to reduce recurrence of condition will improve Ability to verbalize feelings will improve Ability to disclose and discuss suicidal ideas Ability to demonstrate self-control will improve Ability to identify and develop effective coping behaviors will improve Ability to maintain clinical measurements within normal limits will improve Compliance with prescribed medications will improve  Medication Management: Evaluate patient's response, side effects, and tolerance of medication regimen.  Therapeutic Interventions: 1 to 1 sessions, Unit Group sessions and Medication administration.  Evaluation of Outcomes: Progressing  Physician Treatment Plan for Secondary Diagnosis: Principal Problem:   Schizophrenia (HCC) Active Problems:   Tobacco use disorder   Diabetes (HCC)   HTN (hypertension)   Dyslipidemia   Anxiety state  Long Term Goal(s): Improvement in symptoms so as ready for discharge Improvement in symptoms so as ready for discharge   Short Term Goals: Ability to identify changes in lifestyle to reduce recurrence of condition will improve Ability to verbalize feelings will improve Ability to disclose and discuss suicidal ideas Ability to demonstrate self-control will improve Ability to identify and develop effective coping behaviors will improve Ability to maintain clinical measurements within normal limits will improve Compliance with prescribed medications will improve Ability to identify changes in lifestyle to reduce recurrence of condition will improve Ability to verbalize feelings will improve Ability to disclose and discuss suicidal ideas Ability to demonstrate self-control will  improve Ability to identify and develop effective coping behaviors will improve Ability to maintain clinical measurements within normal limits will improve Compliance with prescribed medications will improve     Medication Management: Evaluate patient's response, side effects, and tolerance of medication regimen.  Therapeutic Interventions: 1 to 1 sessions, Unit Group sessions and Medication administration.  Evaluation of Outcomes: Progressing   RN Treatment Plan for Primary Diagnosis: Schizophrenia (HCC) Long Term Goal(s): Knowledge of disease and therapeutic regimen to maintain health will improve  Short Term Goals: Ability to verbalize frustration and anger appropriately will improve, Ability to demonstrate self-control, Ability to participate in decision making will improve, Ability to verbalize feelings will improve and Ability to identify and develop effective coping behaviors will improve  Medication Management: RN will administer medications as ordered by provider, will assess and evaluate patient's response and provide education to patient for prescribed medication. RN  will report any adverse and/or side effects to prescribing provider.  Therapeutic Interventions: 1 on 1 counseling sessions, Psychoeducation, Medication administration, Evaluate responses to treatment, Monitor vital signs and CBGs as ordered, Perform/monitor CIWA, COWS, AIMS and Fall Risk screenings as ordered, Perform wound care treatments as ordered.  Evaluation of Outcomes: Progressing   LCSW Treatment Plan for Primary Diagnosis: Schizophrenia (HCC) Long Term Goal(s): Safe transition to appropriate next level of care at discharge, Engage patient in therapeutic group addressing interpersonal concerns.  Short Term Goals: Engage patient in aftercare planning with referrals and resources, Increase social support, Increase ability to appropriately verbalize feelings, Increase emotional regulation, Identify triggers  associated with mental health/substance abuse issues and Increase skills for wellness and recovery  Therapeutic Interventions: Assess for all discharge needs, 1 to 1 time with Social worker, Explore available resources and support systems, Assess for adequacy in community support network, Educate family and significant other(s) on suicide prevention, Complete Psychosocial Assessment, Interpersonal group therapy.  Evaluation of Outcomes: Progressing   Progress in Treatment: Attending groups: Yes. Participating in groups: Yes. Taking medication as prescribed: Yes. Toleration medication: Yes. Family/Significant other contact made: No, will contact:  Patient declined  Patient understands diagnosis: Yes. Discussing patient identified problems/goals with staff: Yes. Medical problems stabilized or resolved: Yes. Denies suicidal/homicidal ideation: Yes. Issues/concerns per patient self-inventory: No. Other: None   New problem(s) identified: No, Describe:  None  New Short Term/Long Term Goal(s): Elimination of symptoms of psychosis, medication management for mood stabilization; development of comprehensive mental wellness plan.   Patient Goals:  "Go back to my group home." Pt states that he is also interested in getting back on a particular medication. Update 07/16/20: No changes at this time.   Discharge Plan or Barriers: CSW will assist patient back to the group home when he is safely discharged. Update 07/16/20: No changes at this time.   Reason for Continuation of Hospitalization: Medical Issues Medication stabilization  Estimated Length of Stay: TBD  Attendees: Patient: 07/16/2020 12:15 PM  Physician: Dr. Joseph Art, MD 07/16/2020 12:15 PM  Nursing:  07/16/2020 12:15 PM  RN Care Manager: 07/16/2020 12:15 PM  Social Worker: Susa Simmonds, LCSWA 07/16/2020 12:15 PM  Recreational Therapist:  07/16/2020 12:15 PM  Other:  07/16/2020 12:15 PM  Other:  07/16/2020 12:15 PM  Other: 07/16/2020 12:15 PM     Scribe for Treatment Team: Susa Simmonds, LCSWA 07/16/2020 12:15 PM

## 2020-07-16 NOTE — Progress Notes (Signed)
Patient refused to come out of the bathroom to get vital signs taken

## 2020-07-16 NOTE — Progress Notes (Signed)
[facility] MD Progress Note  07/16/2020 11:52 AM John Copeland  MRN:  [id number] Subjective:  I'm doing fine"  Objective: Patient is seen, chart is reviewed.  Nurses report patient has continued to be compliant with medication, and he is attending to ADLS. No acute events overnight, no  Isolative to self. Requested his Trazodone tonight.  Patient seen one-on-one today. Pt reports feeling better,  He denies suicidal ideations, homicidal ideations, visual hallucinations, or auditory hallucinations. He is taking meds, he denies any side effects to medications. He has had no behavioral outbursts or altercations.   Pt has guardian,  that group home remains concerned about pt's  access to cough syrup, and aggression in the home concerning medications. It remains unclear whether he can return. 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 He also promises to not take cough syrup, and to take his medications. Both parties willing to have outpatient commitment in place for him to follow-up with his ACT Team.      Principal Problem: Schizophrenia (HCC) Diagnosis: Principal Problem:   Schizophrenia (HCC) Active Problems:   Tobacco use disorder   Diabetes (HCC)   HTN (hypertension)   Dyslipidemia   Anxiety state  Total Time spent with patient: 35 minutes  Past Psychiatric History: See H&P  Past Medical History:  Past Medical History:  Diagnosis Date  . Anxiety   . Depression   . Diabetes mellitus without complication (HCC)   . Homelessness   . Hypertension   . Schizophrenia (HCC)    History reviewed. No pertinent surgical history. Family History:  Family History  Problem Relation Age of Onset  . Diabetes Other    Family Psychiatric  History: None reported Social History:  Social History   Substance and Sexual Activity  Alcohol Use No     Social History   Substance and Sexual Activity  Drug Use Yes  . Types:  Marijuana   Comment: denies drug use    Social History   Socioeconomic History  . Marital status: Single    Spouse name: Not on file  . Number of children: Not on file  . Years of education: Not on file  . Highest education level: Not on file  Occupational History  . Not on file  Tobacco Use  . Smoking status: Former Smoker    Packs/day: 0.00    Types: Cigarettes  . Smokeless tobacco: Never Used  Vaping Use  . Vaping Use: Never used  Substance and Sexual Activity  . Alcohol use: No  . Drug use: Yes    Types: Marijuana    Comment: denies drug use  . Sexual activity: Not Currently  Other Topics Concern  . Not on file  Social History Narrative  . Not on file   Social Determinants of Health   Financial Resource Strain: Not on file  Food Insecurity: Not on file  Transportation Needs: Not on file  Physical Activity: Not on file  Stress: Not on file  Social Connections: Not on file   Additional Social History:             Patient currently living at [facility], Lenny Pastel administrator [phone])             Sleep: Peri Jefferson  Appetite:  Good  Current Medications: Current Facility-Administered Medications  Medication Dose Route Frequency Provider Last Rate Last Admin  . acetaminophen (TYLENOL) tablet 650 mg  650 mg Oral Q6H PRN Clapacs, Jackquline Denmark, MD   650 mg at 07/13/20 2204  . amLODipine (NORVASC) tablet 5 mg  5 mg Oral Daily Jesse Sans, MD   5 mg at 07/16/20 1324  . ARIPiprazole (ABILIFY) tablet 30 mg  30 mg Oral Daily Mariel Craft, MD   30 mg at 07/16/20 4010  . ARIPiprazole ER (ABILIFY MAINTENA) injection 400 mg  400 mg Intramuscular Q28 days Jesse Sans, MD   400 mg at 07/11/20 1517  . bismuth subsalicylate (PEPTO BISMOL) chewable tablet 524 mg  524 mg Oral Q1H PRN Jesse Sans, MD      . haloperidol (HALDOL) tablet 5 mg  5 mg Oral Q6H PRN Jesse Sans, MD       And  . LORazepam (ATIVAN) tablet 2 mg  2 mg Oral Q6H PRN  Jesse Sans, MD       And  . diphenhydrAMINE (BENADRYL) capsule 50 mg  50 mg Oral Q6H PRN Jesse Sans, MD      . haloperidol lactate (HALDOL) injection 5 mg  5 mg Intramuscular Q6H PRN Jesse Sans, MD       And  . LORazepam (ATIVAN) injection 2 mg  2 mg Intramuscular Q6H PRN Jesse Sans, MD       And  . diphenhydrAMINE (BENADRYL) injection 50 mg  50 mg Intramuscular Q6H PRN Jesse Sans, MD      . docusate sodium (COLACE) capsule 100 mg  100 mg Oral BID PRN Mariel Craft, MD      . metFORMIN (GLUCOPHAGE-XR) 24 hr tablet 500 mg  500 mg Oral Q breakfast Mariel Craft, MD   500 mg at 07/16/20 2725  . nicotine polacrilex (NICORETTE) gum 2 mg  2 mg Oral Q4H PRN Jesse Sans, MD   2 mg at 07/15/20 1544  . pantoprazole (PROTONIX) EC tablet 20 mg  20 mg Oral Daily Jesse Sans, MD   20 mg at 07/16/20 3664  . traZODone (DESYREL) tablet 100 mg  100 mg Oral QHS Mariel Craft, MD   100 mg at 07/15/20 2134    Lab Results:  No results found for this or any previous visit (from the past 48 hour(s)).  Blood Alcohol level:  Lab Results  Component Value Date   ETH <10 07/07/2020   ETH <10 06/23/2020    Metabolic Disorder Labs: Lab Results  Component Value Date   HGBA1C 5.6 07/07/2020   MPG 114.02 07/07/2020   MPG 119.76 01/19/2017   No results found for: PROLACTIN Lab Results  Component Value Date   CHOL 178 07/08/2020   TRIG 69 07/08/2020   HDL 52 07/08/2020   CHOLHDL 3.4 07/08/2020   VLDL 14 07/08/2020   LDLCALC 112 (H) 07/08/2020   LDLCALC 113 (H) 07/07/2020    Physical Findings: AIMS:  , ,  ,  ,    CIWA:  CIWA-Ar Total: 0 COWS:     Musculoskeletal: Strength & Muscle Tone: within normal limits Gait & Station: normal Patient leans: N/A  Psychiatric Specialty Exam:     Blood pressure 136/79, pulse 82, temperature 97.7 F (36.5 C), temperature source Oral, resp. rate 18, height 6' 0.05" (1.83 m), weight 88 kg, SpO2 100 %.Body mass index  is 26.28 kg/m.  General Appearance: Casual and Neat  Eye Contact:  Fair  Speech:  Clear and Coherent and Normal Rate  Volume:  Normal  Mood:  Euthymic  Affect:  Congruent and Constricted  Thought Process:  Goal Directed and Descriptions of Associations: Intact  Orientation:  Full (Time, Place, and Person)  Thought Content:  Goal directed  Suicidal Thoughts:  No, denies  Homicidal Thoughts:  No  Memory:  Immediate;   Fair Recent;   Fair Remote;   Fair  Judgement:  Fair  Insight:  Shallow  Psychomotor Activity:  Normal  Concentration:  Concentration: Good and Attention Span: Good  Recall:  [employer] of Knowledge:  Fair  Language:  Good  Akathisia:  No  Handed:  Right  AIMS (if indicated):     Assets:  Communication Skills Desire for Improvement Physical Health  ADL's:  Intact  Cognition:  WNL  Sleep:  Number of Hours: 7     Treatment Plan Summary: Daily contact with patient to assess and evaluate symptoms and progress in treatment and Medication management   Assessment:  John Copeland is a 30 y.o. male with schizophrenia, anxiety with schizophrenia, anxiety and medication noncompliance.   He is tolerating Abilify without side effect and is denying any SI, HI, AVH.  He states that he is willing to continue the treatment plan as discussed. 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 Mood and psychosis stabilizing.    Diagnosis: Schizophrenia [facility])  Treatment Plan Summary: Daily contact with patient to assess and evaluate symptoms and progress in treatment.   Scheduled medications/labs: -Abilify 30 mg daily for schizophrenia -- Abilify maintenna LAI 400 mg IM injection given 2/28 -Glucophage XR 500 mg daily with breakfast for diabetes -- Continue Intuniv 1 mg daily for concentration and impulsivity  -Protonix 20 mg daily for reflux -Norvasc 5 mg daily for hypertension -Trazodone  for sleep    PRN's : -Hydroxyzine  50 mg every 6 hours as needed for anxiety -Tylenol 650 mg every 6 hours as needed for pain -Colace 100 mg twice daily as needed for constipation -GI protocol in place as needed     Beverly Sessions, MD 07/16/2020, 11:52 AMPatient ID: John Copeland, male   DOB: Sep 28, 1990, 30 y.o.   MRN: [id number]

## 2020-07-17 MED ORDER — TRAZODONE HCL 100 MG PO TABS
150.0000 mg | ORAL_TABLET | Freq: Every day | ORAL | Status: DC
  Administered 2020-07-17: 150 mg via ORAL
  Filled 2020-07-17: qty 1

## 2020-07-17 NOTE — Progress Notes (Signed)
Patient got into an argument with another patient over the TV but was redirectable. At first refused Trazodone but later asked for it. Denies SI HI and AVH

## 2020-07-17 NOTE — Progress Notes (Signed)
[facility] MD Progress Note  07/17/2020 1:12 PM John Copeland  MRN:  [id number] Subjective:  I'm doing fine"  Objective: Patient is seen, chart is reviewed.  Nurses report patient has continued to be compliant with medication, and he is attending to ADLS. No acute events overnight, no  Isolative to self. Pt got into an argument with another patient over the TV but was redirectable. At first refused Trazodone but later asked for it Patient seen one-on-one today. Slept 2.25 hrs.Pt reports feeling better,  He denies suicidal ideations, homicidal ideations,denies hallucination but staff reports he appears to be talking to unseen at times,  He is taking meds, he denies any side effects to medications. He has had no behavioral outbursts or altercations.   Pt has guardian,  that group home remains concerned about pt's  access to cough syrup, and aggression in the home concerning medications. It remains unclear whether he can return. 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 He also promises to not take cough syrup, and to take his medications. Both parties willing to have outpatient commitment in place for him to follow-up with his ACT Team.      Principal Problem: Schizophrenia (HCC) Diagnosis: Principal Problem:   Schizophrenia (HCC) Active Problems:   Tobacco use disorder   Diabetes (HCC)   HTN (hypertension)   Dyslipidemia   Anxiety state  Total Time spent with patient: 30 min  Past Psychiatric History: See H&P  Past Medical History:  Past Medical History:  Diagnosis Date  . Anxiety   . Depression   . Diabetes mellitus without complication (HCC)   . Homelessness   . Hypertension   . Schizophrenia (HCC)    History reviewed. No pertinent surgical history. Family History:  Family History  Problem Relation Age of Onset  . Diabetes Other    Family Psychiatric  History: None reported Social History:  Social  History   Substance and Sexual Activity  Alcohol Use No     Social History   Substance and Sexual Activity  Drug Use Yes  . Types: Marijuana   Comment: denies drug use    Social History   Socioeconomic History  . Marital status: Single    Spouse name: Not on file  . Number of children: Not on file  . Years of education: Not on file  . Highest education level: Not on file  Occupational History  . Not on file  Tobacco Use  . Smoking status: Former Smoker    Packs/day: 0.00    Types: Cigarettes  . Smokeless tobacco: Never Used  Vaping Use  . Vaping Use: Never used  Substance and Sexual Activity  . Alcohol use: No  . Drug use: Yes    Types: Marijuana    Comment: denies drug use  . Sexual activity: Not Currently  Other Topics Concern  . Not on file  Social History Narrative  . Not on file   Social Determinants of Health   Financial Resource Strain: Not on file  Food Insecurity: Not on file  Transportation Needs: Not on file  Physical Activity: Not on file  Stress: Not on file  Social Connections: Not on file   Additional Social History:             Patient currently living at [facility], Lenny Pastel administrator [phone])             Sleep:  Good  Appetite:  Good  Current Medications: Current Facility-Administered Medications  Medication Dose Route Frequency Provider Last Rate Last Admin  . acetaminophen (TYLENOL) tablet 650 mg  650 mg Oral Q6H PRN Clapacs, Jackquline Denmark, MD   650 mg at 07/13/20 2204  . amLODipine (NORVASC) tablet 5 mg  5 mg Oral Daily Jesse Sans, MD   5 mg at 07/17/20 0827  . ARIPiprazole (ABILIFY) tablet 30 mg  30 mg Oral Daily Mariel Craft, MD   30 mg at 07/17/20 0827  . ARIPiprazole ER (ABILIFY MAINTENA) injection 400 mg  400 mg Intramuscular Q28 days Jesse Sans, MD   400 mg at 07/11/20 1517  . bismuth subsalicylate (PEPTO BISMOL) chewable tablet 524 mg  524 mg Oral Q1H PRN Jesse Sans, MD   524 mg at  07/16/20 2253  . haloperidol (HALDOL) tablet 5 mg  5 mg Oral Q6H PRN Jesse Sans, MD       And  . LORazepam (ATIVAN) tablet 2 mg  2 mg Oral Q6H PRN Jesse Sans, MD       And  . diphenhydrAMINE (BENADRYL) capsule 50 mg  50 mg Oral Q6H PRN Jesse Sans, MD      . haloperidol lactate (HALDOL) injection 5 mg  5 mg Intramuscular Q6H PRN Jesse Sans, MD       And  . LORazepam (ATIVAN) injection 2 mg  2 mg Intramuscular Q6H PRN Jesse Sans, MD       And  . diphenhydrAMINE (BENADRYL) injection 50 mg  50 mg Intramuscular Q6H PRN Jesse Sans, MD      . docusate sodium (COLACE) capsule 100 mg  100 mg Oral BID PRN Mariel Craft, MD      . metFORMIN (GLUCOPHAGE-XR) 24 hr tablet 500 mg  500 mg Oral Q breakfast Mariel Craft, MD   500 mg at 07/17/20 0827  . nicotine polacrilex (NICORETTE) gum 2 mg  2 mg Oral Q4H PRN Jesse Sans, MD   2 mg at 07/16/20 1604  . pantoprazole (PROTONIX) EC tablet 20 mg  20 mg Oral Daily Jesse Sans, MD   20 mg at 07/17/20 0827  . traZODone (DESYREL) tablet 100 mg  100 mg Oral QHS Mariel Craft, MD   100 mg at 07/16/20 2126    Lab Results:  No results found for this or any previous visit (from the past 48 hour(s)).  Blood Alcohol level:  Lab Results  Component Value Date   ETH <10 07/07/2020   ETH <10 06/23/2020    Metabolic Disorder Labs: Lab Results  Component Value Date   HGBA1C 5.6 07/07/2020   MPG 114.02 07/07/2020   MPG 119.76 01/19/2017   No results found for: PROLACTIN Lab Results  Component Value Date   CHOL 178 07/08/2020   TRIG 69 07/08/2020   HDL 52 07/08/2020   CHOLHDL 3.4 07/08/2020   VLDL 14 07/08/2020   LDLCALC 112 (H) 07/08/2020   LDLCALC 113 (H) 07/07/2020    Physical Findings: AIMS:  , ,  ,  ,    CIWA:  CIWA-Ar Total: 0 COWS:     Musculoskeletal: Strength & Muscle Tone: within normal limits Gait & Station: normal Patient leans: N/A  Psychiatric Specialty Exam:     Blood pressure  135/66, pulse 83, temperature 98.8 F (37.1 C), temperature source Oral, resp. rate 18, height 6' 0.05" (1.83 m), weight 88 kg, SpO2 100 %.Body mass  index is 26.28 kg/m.  General Appearance: Casual and Neat  Eye Contact:  Fair  Speech:  Clear and Coherent and Normal Rate  Volume:  Normal  Mood:  Euthymic  Affect:  Congruent and Constricted  Thought Process:  Goal Directed and Descriptions of Associations: Intact  Orientation:  Full (Time, Place, and Person)  Thought Content:  Goal directed  Suicidal Thoughts:  No, denies  Homicidal Thoughts:  No  Memory:  Immediate;   Fair Recent;   Fair Remote;   Fair  Judgement:  Fair  Insight:  Shallow  Psychomotor Activity:  Normal  Concentration:  Concentration: Good and Attention Span: Good  Recall:  [employer] of Knowledge:  Fair  Language:  Good  Akathisia:  No  Handed:  Right  AIMS (if indicated):     Assets:  Communication Skills Desire for Improvement Physical Health  ADL's:  Intact  Cognition:  WNL  Sleep:  Number of Hours: 2.25     Treatment Plan Summary: Daily contact with patient to assess and evaluate symptoms and progress in treatment and Medication management   Assessment:  Chrisangel Eskenazi is a 30 y.o. male with schizophrenia, anxiety with schizophrenia, anxiety and medication noncompliance.   He is tolerating Abilify without side effect and is denying any SI, HI, AVH.  He states that he is willing to continue the treatment plan as discussed. 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  Mood and psychosis stabilizing. Poor sleep.    Diagnosis: Schizophrenia [facility])  Treatment Plan Summary: Daily contact with patient to assess and evaluate symptoms and progress in treatment.   Scheduled medications/labs: -Abilify 30 mg daily for schizophrenia -- Abilify maintenna LAI 400 mg IM injection given 2/28 -Glucophage XR 500 mg daily with breakfast for diabetes -- Continue  Intuniv 1 mg daily for concentration and impulsivity  -Protonix 20 mg daily for reflux -Norvasc 5 mg daily for hypertension -increase Trazodone  for sleep    PRN's : -Hydroxyzine 50 mg every 6 hours as needed for anxiety -Tylenol 650 mg every 6 hours as needed for pain -Colace 100 mg twice daily as needed for constipation -GI protocol in place as needed     Beverly Sessions, MD 07/17/2020, 1:12 PMPatient ID: [name], male   DOB: Oct 06, 1990, 30 y.o.   MRN: [id number] Patient ID: Orel Cooler, male   DOB: 14-Oct-1990, 30 y.o.   MRN: [id number]

## 2020-07-17 NOTE — Progress Notes (Signed)
Patient came out for breakfast but has been isolative to his room. He denies SI, HI, and AVH. He had poor sleep, only slept 2.25 hours. Patient was very flat and irritable during assessment but was compliant with medications and with taking vital signs.   Medications were given per MD orders. Support and encouragement was provided. Patient remains safe on the unit at this time and q15 min safety checks are maintained.

## 2020-07-17 NOTE — [facility] Group Notes (Signed)
LCSW Group Therapy 07/17/20 12:50 PM    Type of Therapy and Topic:  Group Therapy:  Setting Goals   Participation Level:  Active   Description of Group: In this process group, patients discussed using strengths to work toward goals and address challenges.  Patients identified two positive things about themselves and one goal they were working on.  Patients were given the opportunity to share openly and support each other's plan for self-empowerment.  The group discussed the value of gratitude and were encouraged to have a daily reflection of positive characteristics or circumstances.  Patients were encouraged to identify a plan to utilize their strengths to work on current challenges and goals.   Therapeutic Goals 1. Patient will verbalize personal strengths/positive qualities and relate how these can assist with achieving desired personal goals 2. Patients will verbalize affirmation of peers plans for personal change and goal setting 3. Patients will explore the value of gratitude and positive focus as related to successful achievement of goals 4. Patients will verbalize a plan for regular reinforcement of personal positive qualities and circumstances.   Summary of Patient Progress: Patient attempt to fill out goal planning sheet and identify his goals.      Therapeutic Modalities Cognitive Behavioral Therapy Motivational Interviewing

## 2020-07-17 NOTE — Plan of Care (Signed)
  Problem: Education: Goal: Knowledge of [facility] Education information/materials will improve Outcome: Progressing Goal: Emotional status will improve Outcome: Progressing Goal: Mental status will improve Outcome: Progressing Goal: Verbalization of understanding the information provided will improve Outcome: Progressing   Problem: Activity: Goal: Interest or engagement in activities will improve Outcome: Progressing Goal: Sleeping patterns will improve Outcome: Progressing   Problem: Coping: Goal: Ability to verbalize frustrations and anger appropriately will improve Outcome: Progressing Goal: Ability to demonstrate self-control will improve Outcome: Progressing   Problem: Health Behavior/Discharge Planning: Goal: Identification of resources available to assist in meeting health care needs will improve Outcome: Progressing Goal: Compliance with treatment plan for underlying cause of condition will improve Outcome: Progressing   Problem: Physical Regulation: Goal: Ability to maintain clinical measurements within normal limits will improve Outcome: Progressing   Problem: Safety: Goal: Periods of time without injury will increase Outcome: Progressing   Problem: Education: Goal: Ability to verbalize precipitating factors for violent behavior will improve Outcome: Progressing   Problem: Coping: Goal: Ability to verbalize frustrations and anger appropriately will improve Outcome: Progressing   Problem: Health Behavior/Discharge Planning: Goal: Ability to implement measures to prevent violent behavior in the future will improve Outcome: Progressing   Problem: Safety: Goal: Ability to demonstrate self-control will improve Outcome: Progressing Goal: Ability to redirect hostility and anger into socially appropriate behaviors will improve Outcome: Progressing   Problem: Education: Goal: Ability to state activities that reduce stress will improve Outcome:  Progressing   Problem: Coping: Goal: Ability to identify and develop effective coping behavior will improve Outcome: Progressing   Problem: Self-Concept: Goal: Ability to identify factors that promote anxiety will improve Outcome: Progressing Goal: Level of anxiety will decrease Outcome: Progressing Goal: Ability to modify response to factors that promote anxiety will improve Outcome: Progressing

## 2020-07-18 MED ORDER — TRAZODONE HCL 100 MG PO TABS
200.0000 mg | ORAL_TABLET | Freq: Every day | ORAL | Status: DC
  Administered 2020-07-18 – 2020-07-19 (×2): 200 mg via ORAL
  Filled 2020-07-18 (×2): qty 2

## 2020-07-18 MED ORDER — GUANFACINE HCL ER 1 MG PO TB24
1.0000 mg | ORAL_TABLET | Freq: Every day | ORAL | Status: DC
  Administered 2020-07-18 – 2020-07-19 (×2): 1 mg via ORAL
  Filled 2020-07-18 (×3): qty 1

## 2020-07-18 NOTE — Progress Notes (Addendum)
Patient is flat on assessment but says he is feeling better than the previous day. Patient denies SI, HI, and AVH. He states he took trazodone last night but does not know why he didn't sleep well. Patient says he is glad to be going to a new group home. He is not complaining of any pain or any other physical concerns. Patient has been isolative to himself and has not attended groups.  Medications are given per MD orders. Support and encouragement is provided. Pt remains safe on the unit at this time and q15 minute safety checks are maintained.

## 2020-07-18 NOTE — Progress Notes (Signed)
   07/18/20 1540  Clinical Encounter Type  Visited With Patient  Visit Type Initial;Spiritual support;Social support  Referral From Chaplain  Consult/Referral To Chaplain  Met with Pt in [facility]. Pt had a lot of concerns about his life. I prayed for all of his concerns. Pt plan to get out on Wednesday. I am going to try to follow up with Pt before he leaves.

## 2020-07-18 NOTE — Plan of Care (Signed)
  Problem: Disruptive/Impulsive Goal: STG - Patient will demonstrate no more than 2 impulsive behaviors during groups within 5 recreation therapy group sessions Description: STG - Patient will demonstrate no more than 2 impulsive behaviors during groups within 5 recreation therapy group sessions Outcome: Progressing

## 2020-07-18 NOTE — Progress Notes (Signed)
[facility] MD Progress Note  07/18/2020 10:26 AM John Copeland  MRN:  [id number] Subjective: "I'm tired"  Objective: Patient is seen, chart is reviewed.  Nurses report patient has continued to be compliant with medication, and he is attending to ADLS. No acute events overnight, no PRNs needed.  Patient seen one-on-one today. He notes he is feeling tired today. He slept poorly last night despite use of Trazodone secondary to noise on the unit. He is resting this morning. He denies suicidal ideations, homicidal ideations, visual hallucinations, and auditory hallucinations. He is agreeable to increase in Trazodone tonight to assist with sleep. He is aware that he has a new group home to go to on Wednesday. Per guardian request, have not informed him of location, and have encouraged him to reach out to [facility].   Social work team able to speak with Guardian on 07/15/20 "CSW spoke with pt's guardian.  He reports that patient has a bed in [place], [place].  He reports that the patient NOT know that he has a placement in [place]. Patient can know that he has a new bed but not the location.  He reports that he doesn't want patient's belongings to be returned to him at discharge but to be given to guardian." Tentative discharge date of Weds for new group home.    Principal Problem: Schizophrenia (HCC) Diagnosis: Principal Problem:   Schizophrenia (HCC) Active Problems:   Tobacco use disorder   Diabetes (HCC)   HTN (hypertension)   Dyslipidemia   Anxiety state  Total Time spent with patient: 35 minutes  Past Psychiatric History: See H&P  Past Medical History:  Past Medical History:  Diagnosis Date  . Anxiety   . Depression   . Diabetes mellitus without complication (HCC)   . Homelessness   . Hypertension   . Schizophrenia (HCC)    History reviewed. No pertinent surgical history. Family History:  Family History  Problem Relation Age of Onset  . Diabetes Other    Family Psychiatric  History: None  reported Social History:  Social History   Substance and Sexual Activity  Alcohol Use No     Social History   Substance and Sexual Activity  Drug Use Yes  . Types: Marijuana   Comment: denies drug use    Social History   Socioeconomic History  . Marital status: Single    Spouse name: Not on file  . Number of children: Not on file  . Years of education: Not on file  . Highest education level: Not on file  Occupational History  . Not on file  Tobacco Use  . Smoking status: Former Smoker    Packs/day: 0.00    Types: Cigarettes  . Smokeless tobacco: Never Used  Vaping Use  . Vaping Use: Never used  Substance and Sexual Activity  . Alcohol use: No  . Drug use: Yes    Types: Marijuana    Comment: denies drug use  . Sexual activity: Not Currently  Other Topics Concern  . Not on file  Social History Narrative  . Not on file   Social Determinants of Health   Financial Resource Strain: Not on file  Food Insecurity: Not on file  Transportation Needs: Not on file  Physical Activity: Not on file  Stress: Not on file  Social Connections: Not on file   Additional Social History:             Patient currently living at [facility], Lenny Pastel administrator [phone])  Sleep: Good  Appetite:  Good  Current Medications: Current Facility-Administered Medications  Medication Dose Route Frequency Provider Last Rate Last Admin  . acetaminophen (TYLENOL) tablet 650 mg  650 mg Oral Q6H PRN Clapacs, Jackquline Denmark, MD   650 mg at 07/17/20 1421  . amLODipine (NORVASC) tablet 5 mg  5 mg Oral Daily Jesse Sans, MD   5 mg at 07/18/20 0800  . ARIPiprazole (ABILIFY) tablet 30 mg  30 mg Oral Daily Mariel Craft, MD   30 mg at 07/18/20 0759  . ARIPiprazole ER (ABILIFY MAINTENA) injection 400 mg  400 mg Intramuscular Q28 days Jesse Sans, MD   400 mg at 07/11/20 1517  . bismuth subsalicylate (PEPTO BISMOL) chewable tablet 524 mg  524 mg Oral Q1H PRN  Jesse Sans, MD   524 mg at 07/17/20 1421  . haloperidol (HALDOL) tablet 5 mg  5 mg Oral Q6H PRN Jesse Sans, MD       And  . LORazepam (ATIVAN) tablet 2 mg  2 mg Oral Q6H PRN Jesse Sans, MD       And  . diphenhydrAMINE (BENADRYL) capsule 50 mg  50 mg Oral Q6H PRN Jesse Sans, MD      . haloperidol lactate (HALDOL) injection 5 mg  5 mg Intramuscular Q6H PRN Jesse Sans, MD       And  . LORazepam (ATIVAN) injection 2 mg  2 mg Intramuscular Q6H PRN Jesse Sans, MD       And  . diphenhydrAMINE (BENADRYL) injection 50 mg  50 mg Intramuscular Q6H PRN Jesse Sans, MD      . docusate sodium (COLACE) capsule 100 mg  100 mg Oral BID PRN Mariel Craft, MD      . metFORMIN (GLUCOPHAGE-XR) 24 hr tablet 500 mg  500 mg Oral Q breakfast Mariel Craft, MD   500 mg at 07/18/20 0759  . nicotine polacrilex (NICORETTE) gum 2 mg  2 mg Oral Q4H PRN Jesse Sans, MD   2 mg at 07/17/20 1926  . pantoprazole (PROTONIX) EC tablet 20 mg  20 mg Oral Daily Jesse Sans, MD   20 mg at 07/18/20 0800  . traZODone (DESYREL) tablet 150 mg  150 mg Oral QHS Beverly Sessions, MD   150 mg at 07/17/20 2111    Lab Results:  No results found for this or any previous visit (from the past 48 hour(s)).  Blood Alcohol level:  Lab Results  Component Value Date   ETH <10 07/07/2020   ETH <10 06/23/2020    Metabolic Disorder Labs: Lab Results  Component Value Date   HGBA1C 5.6 07/07/2020   MPG 114.02 07/07/2020   MPG 119.76 01/19/2017   No results found for: PROLACTIN Lab Results  Component Value Date   CHOL 178 07/08/2020   TRIG 69 07/08/2020   HDL 52 07/08/2020   CHOLHDL 3.4 07/08/2020   VLDL 14 07/08/2020   LDLCALC 112 (H) 07/08/2020   LDLCALC 113 (H) 07/07/2020    Physical Findings: AIMS:  , ,  ,  ,    CIWA:  CIWA-Ar Total: 0 COWS:     Musculoskeletal: Strength & Muscle Tone: within normal limits Gait & Station: normal Patient leans: N/A  Psychiatric  Specialty Exam:     Blood pressure (!) 118/54, pulse 85, temperature 98.7 F (37.1 C), temperature source Oral, resp. rate 18, height 6' 0.05" (1.83 m), weight 88 kg, SpO2 100 %.Body  mass index is 26.28 kg/m.  General Appearance: Casual and Neat  Eye Contact:  Fair  Speech:  Clear and Coherent and Normal Rate  Volume:  Normal  Mood:  Euthymic  Affect:  Congruent and Constricted  Thought Process:  Goal Directed and Descriptions of Associations: Intact  Orientation:  Full (Time, Place, and Person)  Thought Content:  Logical and Hallucinations: None  Suicidal Thoughts:  No  Homicidal Thoughts:  No  Memory:  Immediate;   Fair Recent;   Fair Remote;   Fair  Judgement:  Fair  Insight:  Shallow  Psychomotor Activity:  Normal  Concentration:  Concentration: Good and Attention Span: Good  Recall:  [employer] of Knowledge:  Fair  Language:  Good  Akathisia:  No  Handed:  Right  AIMS (if indicated):     Assets:  Communication Skills Desire for Improvement Physical Health  ADL's:  Intact  Cognition:  WNL  Sleep:  Number of Hours: 4.5     Treatment Plan Summary: Daily contact with patient to assess and evaluate symptoms and progress in treatment and Medication management   Assessment:  John Copeland is a 30 y.o. male with schizophrenia, anxiety with schizophrenia, anxiety and medication noncompliance.   He is tolerating Abilify without side effect and is denying any SI, HI, AVH.  He states that he is willing to continue the treatment plan as discussed. 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    Diagnosis: Schizophrenia [facility])  Treatment Plan Summary: Daily contact with patient to assess and evaluate symptoms and progress in treatment.   Scheduled medications/labs: -Abilify 30 mg daily for schizophrenia -- Abilify maintenna LAI 400 mg IM injection given 2/28 -Glucophage XR 500 mg daily with breakfast for diabetes -Protonix  20 mg daily for reflux -Norvasc 5 mg daily for hypertension -Increase Trazodone 200 mg at bedtime for sleep    PRN's : -Hydroxyzine 50 mg every 6 hours as needed for anxiety -Tylenol 650 mg every 6 hours as needed for pain -Colace 100 mg twice daily as needed for constipation -GI protocol in place as needed  07/18/20: Psychiatric exam above reviewed and remains accurate. Assessment and plan above reviewed and updated.     Jesse Sans, MD 07/18/2020, 10:26 AM
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
## 2020-07-18 NOTE — Plan of Care (Signed)
  Problem: Education: Goal: Knowledge of [facility] Education information/materials will improve Outcome: Progressing Goal: Emotional status will improve Outcome: Progressing Goal: Mental status will improve Outcome: Progressing Goal: Verbalization of understanding the information provided will improve Outcome: Progressing   Problem: Activity: Goal: Interest or engagement in activities will improve Outcome: Progressing Goal: Sleeping patterns will improve Outcome: Progressing   Problem: Coping: Goal: Ability to verbalize frustrations and anger appropriately will improve Outcome: Progressing Goal: Ability to demonstrate self-control will improve Outcome: Progressing   Problem: Health Behavior/Discharge Planning: Goal: Identification of resources available to assist in meeting health care needs will improve Outcome: Progressing Goal: Compliance with treatment plan for underlying cause of condition will improve Outcome: Progressing   Problem: Physical Regulation: Goal: Ability to maintain clinical measurements within normal limits will improve Outcome: Progressing   Problem: Safety: Goal: Periods of time without injury will increase Outcome: Progressing   Problem: Education: Goal: Ability to verbalize precipitating factors for violent behavior will improve Outcome: Progressing   Problem: Coping: Goal: Ability to verbalize frustrations and anger appropriately will improve Outcome: Progressing   Problem: Health Behavior/Discharge Planning: Goal: Ability to implement measures to prevent violent behavior in the future will improve Outcome: Progressing   Problem: Safety: Goal: Ability to demonstrate self-control will improve Outcome: Progressing Goal: Ability to redirect hostility and anger into socially appropriate behaviors will improve Outcome: Progressing   Problem: Education: Goal: Ability to state activities that reduce stress will improve Outcome:  Progressing   Problem: Coping: Goal: Ability to identify and develop effective coping behavior will improve Outcome: Progressing   Problem: Self-Concept: Goal: Ability to identify factors that promote anxiety will improve Outcome: Progressing Goal: Level of anxiety will decrease Outcome: Progressing Goal: Ability to modify response to factors that promote anxiety will improve Outcome: Progressing   

## 2020-07-18 NOTE — Plan of Care (Signed)
Patient stated he feels fine and is ready to go   Problem: Education: Goal: Emotional status will improve Outcome: Not Progressing Goal: Mental status will improve Outcome: Not Progressing

## 2020-07-18 NOTE — Progress Notes (Signed)
Recreation Therapy Notes  Date: 07/18/2020  Time: 9:30 am   Location: Craft room    Behavioral response: N/A   Intervention Topic: Courtyard    Discussion/Intervention: Patient did not attend group.   Clinical Observations/Feedback:  Patient did not attend group.   Shaverence Outlaw LRT/CTRS        Shaverence  Outlaw 07/18/2020 11:28 AM

## 2020-07-18 NOTE — Progress Notes (Signed)
Patient calm and pleasant during assessment denying SI/HI/AVH. Patient observed interacting appropriately with staff and peers on the unit. Patient compliant with medication administration per MD orders. Patient given education, support and encouragement to be active in his treatment plan. Patient being monitored Q 15 minutes for safety per unit protocol. Pt remains safe on the unit.
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
## 2020-07-18 NOTE — Progress Notes (Signed)
Patient has been slightly irritable. Asked for the heat to be turned up in his room and then became angry when he could not feel the heat right away and raised his voice but then later apologized. Denies SI, HI and AVH

## 2020-07-18 NOTE — BHH Group Notes (Signed)
LCSW Group Therapy Note   07/18/2020 2:44 PM  Type of Therapy and Topic:  Group Therapy:  Overcoming Obstacles   Participation Level:  Did Not Attend   Description of Group:    In this group patients will be encouraged to explore what they see as obstacles to their own wellness and recovery. They will be guided to discuss their thoughts, feelings, and behaviors related to these obstacles. The group will process together ways to cope with barriers, with attention given to specific choices patients can make. Each patient will be challenged to identify changes they are motivated to make in order to overcome their obstacles. This group will be process-oriented, with patients participating in exploration of their own experiences as well as giving and receiving support and challenge from other group members.   Therapeutic Goals: 1. Patient will identify personal and current obstacles as they relate to admission. 2. Patient will identify barriers that currently interfere with their wellness or overcoming obstacles.  3. Patient will identify feelings, thought process and behaviors related to these barriers. 4. Patient will identify two changes they are willing to make to overcome these obstacles:      Summary of Patient Progress X     Therapeutic Modalities:   Cognitive Behavioral Therapy Solution Focused Therapy Motivational Interviewing Relapse Prevention Therapy  Simona Huh R. Algis Greenhouse, MSW, LCSW, LCAS 07/18/2020 2:44 PM

## 2020-07-19 MED ORDER — PANTOPRAZOLE SODIUM 20 MG PO TBEC: 20.0000 mg | DELAYED_RELEASE_TABLET | Freq: Every day | ORAL | 1 refills | Status: DC

## 2020-07-19 MED ORDER — GUANFACINE HCL ER 1 MG PO TB24: 1.0000 mg | ORAL_TABLET | Freq: Every day | ORAL | 1 refills | Status: DC

## 2020-07-19 MED ORDER — AMLODIPINE BESYLATE 5 MG PO TABS: 5.0000 mg | ORAL_TABLET | Freq: Every day | ORAL | 1 refills | Status: DC

## 2020-07-19 MED ORDER — VITAMIN D3 25 MCG (1000 UT) PO CAPS: 2000.0000 [IU] | ORAL_CAPSULE | Freq: Every day | ORAL | 1 refills | Status: DC

## 2020-07-19 MED ORDER — NICOTINE POLACRILEX 2 MG MT GUM: 2.0000 mg | CHEWING_GUM | OROMUCOSAL | 0 refills | Status: DC | PRN

## 2020-07-19 MED ORDER — ARIPIPRAZOLE 30 MG PO TABS: 30.0000 mg | ORAL_TABLET | Freq: Every day | ORAL | 0 refills | Status: DC

## 2020-07-19 MED ORDER — TRAZODONE HCL 100 MG PO TABS: 200.0000 mg | ORAL_TABLET | Freq: Every day | ORAL | 1 refills | Status: DC

## 2020-07-19 MED ORDER — ARIPIPRAZOLE ER 400 MG IM SRER: 400.0000 mg | INTRAMUSCULAR | 3 refills | Status: DC

## 2020-07-19 MED ORDER — DOCUSATE SODIUM 100 MG PO CAPS
100.0000 mg | ORAL_CAPSULE | Freq: Two times a day (BID) | ORAL | 1 refills | Status: DC | PRN
Start: 2020-07-19 — End: 2021-09-05

## 2020-07-19 MED ORDER — METFORMIN HCL ER 500 MG PO TB24: 500.0000 mg | ORAL_TABLET | Freq: Every day | ORAL | 1 refills | Status: DC

## 2020-07-19 NOTE — Plan of Care (Signed)
Patient denies SI,HI and AVH. Looking forward for discharge tomorrow. Visible in the milieu. Compliant with medications.Attended groups. Appetite and energy level good. Support and encouragement given.

## 2020-07-19 NOTE — Progress Notes (Signed)
  Chaplain On-Call received page from [employer] who reported that the patient requests to talk with a Chaplain.  Chaplain met patient in his room, and provided spiritual and emotional support and prayer, as he described factors of his hospitalization, and several goals that he has set for himself upon his discharge.  He stated that he anticipates to be discharged tomorrow.  Chaplain Pollyann Samples M.Div., [facility]

## 2020-07-19 NOTE — Progress Notes (Signed)
Patient calm and pleasant during assessment denying SI/HI/AVH. Patient observed interacting appropriately with staff and peers on the unit. Patient compliant with medication administration per MD orders. Patient given education, support and encouragement to be active in his treatment plan. Patient being monitored Q 15 minutes for safety per unit protocol. Pt remains safe on the unit. 

## 2020-07-19 NOTE — Progress Notes (Signed)
[facility] MD Progress Note  07/19/2020 10:29 AM John Copeland  MRN:  [id number] Subjective: "I'm ready to go"  Objective: Patient is seen, chart is reviewed.  Nurses report patient has continued to be compliant with medication, and he is attending to ADLS. No acute events overnight, no PRNs needed.  Patient seen one-on-one today. He notes he is both happy and anxious about leaving tomorrow. He denies suicidal ideations, homicidal ideations, visual hallucinations, and auditory hallucinations. He is hopeful that someone will retrieve his belongings from old group home, and he also inquires about getting nicorette gum for smoking cessation upon discharge.   Contacted his guardian [name] [phone]: He has already gathered patient's belongings from group home. He confirms that he will arrive at 10AM tomorrow to pick patient up and bring him to his new group home. He requests letter with updated changes to medication. He also requests outpatient commitment for patient, but unable to complete as [place]. Provided a letter recommending outpatient commitment and providing instructions of how to do so with new ACT Team located in [facility]. [name] also provides consent for nicorette gum at discharge. No other questions or concerns expressed.   Principal Problem: Schizophrenia (HCC) Diagnosis: Principal Problem:   Schizophrenia (HCC) Active Problems:   Tobacco use disorder   Diabetes (HCC)   HTN (hypertension)   Dyslipidemia   Anxiety state  Total Time spent with patient: 35 minutes  Past Psychiatric History: See H&P  Past Medical History:  Past Medical History:  Diagnosis Date  . Anxiety   . Depression   . Diabetes mellitus without complication (HCC)   . Homelessness   . Hypertension   . Schizophrenia (HCC)    History reviewed. No pertinent surgical history. Family History:  Family History  Problem Relation Age of Onset  . Diabetes Other    Family  Psychiatric  History: None reported Social History:  Social History   Substance and Sexual Activity  Alcohol Use No     Social History   Substance and Sexual Activity  Drug Use Yes  . Types: Marijuana   Comment: denies drug use    Social History   Socioeconomic History  . Marital status: Single    Spouse name: Not on file  . Number of children: Not on file  . Years of education: Not on file  . Highest education level: Not on file  Occupational History  . Not on file  Tobacco Use  . Smoking status: Former Smoker    Packs/day: 0.00    Types: Cigarettes  . Smokeless tobacco: Never Used  Vaping Use  . Vaping Use: Never used  Substance and Sexual Activity  . Alcohol use: No  . Drug use: Yes    Types: Marijuana    Comment: denies drug use  . Sexual activity: Not Currently  Other Topics Concern  . Not on file  Social History Narrative  . Not on file   Social Determinants of Health   Financial Resource Strain: Not on file  Food Insecurity: Not on file  Transportation Needs: Not on file  Physical Activity: Not on file  Stress: Not on file  Social Connections: Not on file   Additional Social History:           Sleep: Good  Appetite:  Good  Current Medications: Current Facility-Administered Medications  Medication Dose Route Frequency Provider Last Rate Last Admin  . acetaminophen (TYLENOL) tablet 650 mg  650 mg Oral Q6H  PRN Audery Amel, MD   650 mg at 07/19/20 0946  . amLODipine (NORVASC) tablet 5 mg  5 mg Oral Daily Jesse Sans, MD   5 mg at 07/19/20 0836  . ARIPiprazole (ABILIFY) tablet 30 mg  30 mg Oral Daily Mariel Craft, MD   30 mg at 07/19/20 0836  . ARIPiprazole ER (ABILIFY MAINTENA) injection 400 mg  400 mg Intramuscular Q28 days Jesse Sans, MD   400 mg at 07/11/20 1517  . bismuth subsalicylate (PEPTO BISMOL) chewable tablet 524 mg  524 mg Oral Q1H PRN Jesse Sans, MD   524 mg at 07/19/20 0946  . haloperidol (HALDOL) tablet 5  mg  5 mg Oral Q6H PRN Jesse Sans, MD       And  . LORazepam (ATIVAN) tablet 2 mg  2 mg Oral Q6H PRN Jesse Sans, MD       And  . diphenhydrAMINE (BENADRYL) capsule 50 mg  50 mg Oral Q6H PRN Jesse Sans, MD      . haloperidol lactate (HALDOL) injection 5 mg  5 mg Intramuscular Q6H PRN Jesse Sans, MD       And  . LORazepam (ATIVAN) injection 2 mg  2 mg Intramuscular Q6H PRN Jesse Sans, MD       And  . diphenhydrAMINE (BENADRYL) injection 50 mg  50 mg Intramuscular Q6H PRN Jesse Sans, MD      . docusate sodium (COLACE) capsule 100 mg  100 mg Oral BID PRN Mariel Craft, MD      . guanFACINE (INTUNIV) ER tablet 1 mg  1 mg Oral QHS Jesse Sans, MD   1 mg at 07/18/20 2145  . metFORMIN (GLUCOPHAGE-XR) 24 hr tablet 500 mg  500 mg Oral Q breakfast Mariel Craft, MD   500 mg at 07/19/20 0836  . nicotine polacrilex (NICORETTE) gum 2 mg  2 mg Oral Q4H PRN Jesse Sans, MD   2 mg at 07/19/20 0946  . pantoprazole (PROTONIX) EC tablet 20 mg  20 mg Oral Daily Jesse Sans, MD   20 mg at 07/19/20 0836  . traZODone (DESYREL) tablet 200 mg  200 mg Oral QHS Jesse Sans, MD   200 mg at 07/18/20 2133    Lab Results:  No results found for this or any previous visit (from the past 48 hour(s)).  Blood Alcohol level:  Lab Results  Component Value Date   ETH <10 07/07/2020   ETH <10 06/23/2020    Metabolic Disorder Labs: Lab Results  Component Value Date   HGBA1C 5.6 07/07/2020   MPG 114.02 07/07/2020   MPG 119.76 01/19/2017   No results found for: PROLACTIN Lab Results  Component Value Date   CHOL 178 07/08/2020   TRIG 69 07/08/2020   HDL 52 07/08/2020   CHOLHDL 3.4 07/08/2020   VLDL 14 07/08/2020   LDLCALC 112 (H) 07/08/2020   LDLCALC 113 (H) 07/07/2020    Physical Findings: AIMS:  , ,  ,  ,    CIWA:  CIWA-Ar Total: 0 COWS:     Musculoskeletal: Strength & Muscle Tone: within normal limits Gait & Station: normal Patient leans:  N/A  Psychiatric Specialty Exam:     Blood pressure (!) 118/54, pulse 85, temperature 98.7 F (37.1 C), temperature source Oral, resp. rate 18, height 6' 0.05" (1.83 m), weight 88 kg, SpO2 100 %.Body mass index is 26.28 kg/m.  General Appearance: Casual  and Neat  Eye Contact:  Fair  Speech:  Clear and Coherent and Normal Rate  Volume:  Normal  Mood:  Euthymic  Affect:  Congruent and Constricted  Thought Process:  Goal Directed and Descriptions of Associations: Intact  Orientation:  Full (Time, Place, and Person)  Thought Content:  Logical and Hallucinations: None  Suicidal Thoughts:  No  Homicidal Thoughts:  No  Memory:  Immediate;   Fair Recent;   Fair Remote;   Fair  Judgement:  Fair  Insight:  Shallow  Psychomotor Activity:  Normal  Concentration:  Concentration: Good and Attention Span: Good  Recall:  [employer] of Knowledge:  Fair  Language:  Good  Akathisia:  No  Handed:  Right  AIMS (if indicated):     Assets:  Communication Skills Desire for Improvement Physical Health  ADL's:  Intact  Cognition:  WNL  Sleep:  Number of Hours: 8.15     Treatment Plan Summary: Daily contact with patient to assess and evaluate symptoms and progress in treatment and Medication management   Assessment:  Obediah Welles is a 30 y.o. male with schizophrenia, anxiety with schizophrenia, anxiety and medication noncompliance.   He is tolerating Abilify without side effect and is denying any SI, HI, AVH.  He states that he is willing to continue the treatment plan as discussed. 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    Diagnosis: Schizophrenia [facility])  Treatment Plan Summary: Daily contact with patient to assess and evaluate symptoms and progress in treatment.   Scheduled medications/labs: -Abilify 30 mg daily for schizophrenia -- Abilify maintenna LAI 400 mg IM injection given 2/28 --Continue Intuniv 1 mg QHS -Glucophage XR 500  mg daily with breakfast for diabetes -Protonix 20 mg daily for reflux -Norvasc 5 mg daily for hypertension -Continue Trazodone 200 mg at bedtime for sleep    PRN's : -Hydroxyzine 50 mg every 6 hours as needed for anxiety -Tylenol 650 mg every 6 hours as needed for pain -Colace 100 mg twice daily as needed for constipation -GI protocol in place as needed  07/19/20: Psychiatric exam above reviewed and remains accurate. Assessment and plan above reviewed and updated.      Jesse Sans, MD 07/19/2020, 10:29 AM

## 2020-07-19 NOTE — Discharge Summary (Signed)
Physician Discharge Summary Note  Patient:  John Copeland is an 30 y.o., male MRN:  [id number] DOB:  27-Mar-1991 Patient phone:  [phone] (home)  Patient address:   [address] [place] [id number],  Total Time spent with patient: 35 minutes- 25 minutes face-to-face contact with patient, 10 minutes documentation, coordination of care, scripts   Date of Admission:  07/08/2020 Date of Discharge: 07/20/2020  Reason for Admission:  John Copeland is a 30 y.o. male with a history of schizophrenia who lives in a group home and has been having frequent visits to the emergency room for agitated behavior upon medication refusal.  Principal Problem: Schizophrenia [facility](HCC) Discharge Diagnoses: Principal Problem:   Schizophrenia (HCC) Active Problems:   Tobacco use disorder   Diabetes (HCC)   HTN (hypertension)   Dyslipidemia   Anxiety state   Past Psychiatric History: Patient has a long-term history of mental health problems. Multiple prior hospitalizations including lengthy state hospitalizations. Seems to do fairly well when he is in a hospital structured setting and has been treated with mood stabilizers and antipsychotics but seems to frequently decompensate become noncompliant and argumentative when he is outside of a contained setting. Distant history of suicide attempt nothing recent. Vague about the amount of aggression he gets into does not appear to have a major history of violence. No recent substance abuse nothing documented about problem substance abuse in the past. Not clear to me whether he has ever been maintained on long-acting injectables. On at least one occasion there was a mention of a plan to put him on long-acting Abilify but it does not look like that was necessarily done. There is a mention of him being on Haldol Decanoate now but I am not sure that was ever actually given  Past Medical History:  Past Medical History:  Diagnosis Date   Anxiety     Depression    Diabetes mellitus without complication (HCC)    Homelessness    Hypertension    Schizophrenia (HCC)    History reviewed. No pertinent surgical history. Family History:  Family History  Problem Relation Age of Onset   Diabetes Other    Family Psychiatric  History: Patient unaware, in foster care from a young age Social History:  Social History   Substance and Sexual Activity  Alcohol Use No     Social History   Substance and Sexual Activity  Drug Use Yes   Types: Marijuana   Comment: denies drug use    Social History   Socioeconomic History   Marital status: Single    Spouse name: Not on file   Number of children: Not on file   Years of education: Not on file   Highest education level: Not on file  Occupational History   Not on file  Tobacco Use   Smoking status: Former Smoker    Packs/day: 0.00    Types: Cigarettes   Smokeless tobacco: Never Used  Vaping Use   Vaping Use: Never used  Substance and Sexual Activity   Alcohol use: No   Drug use: Yes    Types: Marijuana    Comment: denies drug use   Sexual activity: Not Currently  Other Topics Concern   Not on file  Social History Narrative   Not on file   Social Determinants of Health   Financial Resource Strain: Not on file  Food Insecurity: Not on file  Transportation Needs: Not on file  Physical Activity: Not on file  Stress: Not  on file  Social Connections: Not on file    Hospital Course:  John Copeland is a 30 year old male with history of schizophrenia. He has a history of noncompliance with medications. During this hospital stay we simplified his regimen to give the fewest number of medications possible while maintaining his psychiatric baseline. He is currently on Norvasc 5 mg daily for hypertension. He has received Abilify Maintenna 400 mg injection on 07/11/20, and next injection will be due 08/08/20. Recommend a slow taper on oral Abilify 30 mg daily as I anticipate  he will require oral on top of LAI to maintain stability. He was also given Intuniv ER 1 mg nightly for impulse control and irritability. He was continued on metformin XR 500 mg with breakfast to mitigate weight gain and metabolic side effects of antipsychotics. He is also on Protonix 20 mg daily for acid reflux, and trazodone 200 mg at bedtime for sleep. At time of discharge he denied suicidal ideations, homicidal ideations, visual hallucinations, and auditory hallucinations.  Physical Findings: AIMS: Facial and Oral Movements Muscles of Facial Expression: None, normal Lips and Perioral Area: None, normal Jaw: None, normal Tongue: None, normal,Extremity Movements Upper (arms, wrists, hands, fingers): None, normal Lower (legs, knees, ankles, toes): None, normal, Trunk Movements Neck, shoulders, hips: None, normal, Overall Severity Severity of abnormal movements (highest score from questions above): None, normal Incapacitation due to abnormal movements: None, normal Patient's awareness of abnormal movements (rate only patient's report): No Awareness, Dental Status Current problems with teeth and/or dentures?: No Does patient usually wear dentures?: No  CIWA:  CIWA-Ar Total: 0 COWS:     Musculoskeletal: Strength & Muscle Tone: within normal limits Gait & Station: normal Patient leans: N/A  Psychiatric Specialty Exam: Physical Exam Vitals and nursing note reviewed.  Constitutional:      Appearance: Normal appearance.  HENT:     Head: Normocephalic and atraumatic.     Right Ear: External ear normal.     Left Ear: External ear normal.     Nose: Nose normal.     Mouth/Throat:     Mouth: Mucous membranes are moist.     Pharynx: Oropharynx is clear.  Eyes:     Extraocular Movements: Extraocular movements intact.     Conjunctiva/sclera: Conjunctivae normal.     Pupils: Pupils are equal, round, and reactive to light.  Cardiovascular:     Rate and Rhythm: Normal rate.     Pulses:  Normal pulses.  Pulmonary:     Effort: Pulmonary effort is normal.     Breath sounds: Normal breath sounds.  Abdominal:     General: Abdomen is flat.     Palpations: Abdomen is soft.  Musculoskeletal:        General: No swelling. Normal range of motion.     Cervical back: Normal range of motion and neck supple.  Skin:    General: Skin is warm and dry.  Neurological:     General: No focal deficit present.     Mental Status: He is alert and oriented to person, place, and time.  Psychiatric:        Mood and Affect: Mood normal.        Behavior: Behavior normal.        Thought Content: Thought content normal.        Judgment: Judgment normal.     Review of Systems  Constitutional: Negative for appetite change and fatigue.  HENT: Negative for rhinorrhea and sore throat.   Eyes: Negative for  photophobia and visual disturbance.  Respiratory: Negative for cough and shortness of breath.   Cardiovascular: Negative for chest pain and palpitations.  Gastrointestinal: Negative for constipation, diarrhea, nausea and vomiting.  Endocrine: Negative for cold intolerance and heat intolerance.  Genitourinary: Negative for difficulty urinating and dysuria.  Musculoskeletal: Negative for arthralgias and myalgias.  Skin: Negative for rash and wound.  Allergic/Immunologic: Negative for food allergies and immunocompromised state.  Neurological: Negative for dizziness and headaches.  Hematological: Negative for adenopathy. Does not bruise/bleed easily.  Psychiatric/Behavioral: Negative for behavioral problems, hallucinations and suicidal ideas.    Blood pressure 117/83, pulse 87, temperature 97.7 F (36.5 C), temperature source Oral, resp. rate 18, height 6' 0.05" (1.83 m), weight 88 kg, SpO2 100 %.Body mass index is 26.28 kg/m.  General Appearance: Well Groomed  Patent attorney::  Good  Speech:  Clear and Coherent and Normal Rate  Volume:  Normal  Mood:  Euthymic  Affect:  Congruent  Thought  Process:  Coherent  Orientation:  Full (Time, Place, and Person)  Thought Content:  Logical  Suicidal Thoughts:  No  Homicidal Thoughts:  No  Memory:  Immediate;   Fair Recent;   Fair Remote;   Fair  Judgement:  Intact  Insight:  Present  Psychomotor Activity:  Normal  Concentration:  Fair  Recall:  Fair  Fund of Knowledge:Fair  Language: Fair  Akathisia:  Negative  Handed:  Right  AIMS (if indicated):     Assets:  Communication Skills Desire for Improvement Financial Resources/Insurance Housing Physical Health Resilience Social Support  Sleep:  Number of Hours: 7.5  Cognition: WNL  ADL's:  Intact        Have you used any form of tobacco in the last 30 days? (Cigarettes, Smokeless Tobacco, Cigars, and/or Pipes): No  Has this patient used any form of tobacco in the last 30 days? (Cigarettes, Smokeless Tobacco, Cigars, and/or Pipes) No  Blood Alcohol level:  Lab Results  Component Value Date   ETH <10 07/07/2020   ETH <10 06/23/2020    Metabolic Disorder Labs:  Lab Results  Component Value Date   HGBA1C 5.6 07/07/2020   MPG 114.02 07/07/2020   MPG 119.76 01/19/2017   No results found for: PROLACTIN Lab Results  Component Value Date   CHOL 178 07/08/2020   TRIG 69 07/08/2020   HDL 52 07/08/2020   CHOLHDL 3.4 07/08/2020   VLDL 14 07/08/2020   LDLCALC 112 (H) 07/08/2020   LDLCALC 113 (H) 07/07/2020    See Psychiatric Specialty Exam and Suicide Risk Assessment completed by Attending Physician prior to discharge.  Discharge destination:  Other:  group home  Is patient on multiple antipsychotic therapies at discharge:  No   Has Patient had three or more failed trials of antipsychotic monotherapy by history:  No  Recommended Plan for Multiple Antipsychotic Therapies: NA  Discharge Instructions    Diet general   Complete by: As directed    Increase activity slowly   Complete by: As directed      Allergies as of 07/20/2020      Reactions   Risperdal  [risperidone]       Medication List    STOP taking these medications   benztropine 1 MG tablet Commonly known as: COGENTIN   Depakote ER 250 MG 24 hr tablet Generic drug: divalproex   haloperidol decanoate 50 MG/ML injection Commonly known as: HALDOL DECANOATE   loratadine 10 MG tablet Commonly known as: CLARITIN   LORazepam 1 MG tablet Commonly  known as: ATIVAN   metFORMIN 500 MG tablet Commonly known as: GLUCOPHAGE Replaced by: metFORMIN 500 MG 24 hr tablet   OLANZapine 15 MG tablet Commonly known as: ZYPREXA   sertraline 50 MG tablet Commonly known as: ZOLOFT     TAKE these medications     Indication  amLODipine 5 MG tablet Commonly known as: NORVASC Take 1 tablet (5 mg total) by mouth daily.  Indication: High Blood Pressure Disorder   ARIPiprazole 30 MG tablet Commonly known as: ABILIFY Take 1 tablet (30 mg total) by mouth daily.  Indication: Schizophrenia   ARIPiprazole ER 400 MG Srer injection Commonly known as: ABILIFY MAINTENA Inject 2 mLs (400 mg total) into the muscle every 28 (twenty-eight) days. Start taking on: August 08, 2020  Indication: Schizophrenia   docusate sodium 100 MG capsule Commonly known as: COLACE Take 1 capsule (100 mg total) by mouth 2 (two) times daily as needed for mild constipation. What changed:   when to take this  reasons to take this  Indication: Constipation   guanFACINE 1 MG Tb24 ER tablet Commonly known as: INTUNIV Take 1 tablet (1 mg total) by mouth at bedtime.  Indication: Attention Deficit Hyperactivity Disorder   metFORMIN 500 MG 24 hr tablet Commonly known as: GLUCOPHAGE-XR Take 1 tablet (500 mg total) by mouth daily with breakfast. Replaces: metFORMIN 500 MG tablet  Indication: Antipsychotic Therapy-Induced Weight Gain   nicotine polacrilex 2 MG gum Commonly known as: NICORETTE Take 1 each (2 mg total) by mouth every 4 (four) hours as needed for smoking cessation.  Indication: Nicotine Addiction    pantoprazole 20 MG tablet Commonly known as: PROTONIX Take 1 tablet (20 mg total) by mouth daily. What changed:   medication strength  how much to take  when to take this  Indication: Gastroesophageal Reflux Disease   traZODone 100 MG tablet Commonly known as: DESYREL Take 2 tablets (200 mg total) by mouth at bedtime.  Indication: Trouble Sleeping   Vitamin D3 25 MCG (1000 UT) Caps Take 2 capsules (2,000 Units total) by mouth daily.  Indication: Vitamin D Deficiency       Follow-up Information    [facility] Follow up.   Contact information: [address] [place] [address] [phone]               Follow-up recommendations:  Activity:  as tolerated Diet:  regular diet  Comments:  Printed 30-day scripts with one refill provided to guardian at discharge.   Signed: Jesse Sans, MD 07/20/2020, 8:22 AM
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
## 2020-07-19 NOTE — Plan of Care (Signed)
Patient stated to this writer that he is ready to leave tomorrow  Problem: Education: Goal: Emotional status will improve Outcome: Progressing Goal: Mental status will improve Outcome: Progressing

## 2020-07-19 NOTE — BHH Group Notes (Signed)
LCSW Group Therapy Note     07/19/2020 2:22 PM     Type of Therapy/Topic:  Group Therapy:  Feelings about Diagnosis     Participation Level:  None     Description of Group:   This group will allow patients to explore their thoughts and feelings about diagnoses they have received. Patients will be guided to explore their level of understanding and acceptance of these diagnoses. Facilitator will encourage patients to process their thoughts and feelings about the reactions of others to their diagnosis and will guide patients in identifying ways to discuss their diagnosis with significant others in their lives. This group will be process-oriented, with patients participating in exploration of their own experiences, giving and receiving support, and processing challenge from other group members.        Therapeutic Goals:  1.    Patient will demonstrate understanding of diagnosis as evidenced by identifying two or more symptoms of the disorder  2.    Patient will be able to express two feelings regarding the diagnosis  3.    Patient will demonstrate their ability to communicate their needs through discussion and/or role play     Summary of Patient Progress: Pt was physically present during group but read his Bible he came in with and did not discuss throughout the session.   Therapeutic Modalities:   Cognitive Behavioral Therapy  Brief Therapy  Feelings Identification    Lenorris Karger [place], MSW, LCSW-A  07/19/2020 2:22 PM
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
## 2020-07-19 NOTE — Progress Notes (Signed)
Recreation Therapy Notes   Date: 07/19/2020  Time: 9:30 am   Location: Craft room  Behavioral response: Not engaged, Impulsive  Intervention Topic: Stress Management   Discussion/Intervention:  Group content on today was focused on stress. The group defined stress and way to cope with stress. Participants expressed how they know when they are stresses out. Individuals described the different ways they have to cope with stress. The group stated reasons why it is important to cope with stress. Patient explained what good stress is and some examples. The group participated in the intervention "Stress Management". Individuals were separated into two group and answered questions related to stress.  Clinical Observations/Feedback: Patient came to group late due to unknown reasons.He became upset when he was told; he was not allowed to have a pencil unattended. Patient threw the pencil on the floor and stormed out of group.  Samantha Olivera LRT/CTRS          John Copeland 07/19/2020 12:06 PM

## 2020-07-20 NOTE — Progress Notes (Signed)
Recreation Therapy Notes  INPATIENT RECREATION TR PLAN  Patient Details Name: John Copeland MRN: [id number] DOB: 31-Oct-1990 Today's Date: 07/20/2020  Rec Therapy Plan Is patient appropriate for Therapeutic Recreation?: Yes Treatment times per week: at least 3 Estimated Length of Stay: 5-7 days TR Treatment/Interventions: Group participation (Comment)  Discharge Criteria Pt will be discharged from therapy if:: Discharged Treatment plan/goals/alternatives discussed and agreed upon by:: Patient/family  Discharge Summary Short term goals set: Patient will demonstrate no more than 2 impulsive behaviors during groups within 5 recreation therapy group sessions Short term goals met: Adequate for discharge Progress toward goals comments: Groups attended Which groups?: Stress management,Leisure education,Self-esteem,Other (Comment) (Problem Solving) Reason goals not met: N/A Therapeutic equipment acquired: N/A Reason patient discharged from therapy: Discharge from hospital Pt/family agrees with progress & goals achieved: Yes Date patient discharged from therapy: 07/20/20   Shaverence  Outlaw 07/20/2020, 12:48 PM

## 2020-07-20 NOTE — Progress Notes (Signed)
  [facility] Adult Case Management Discharge Plan :  Will you be returning to the same living situation after discharge:  No.  Guardian reports that patient is going to a new group home.  At discharge, do you have transportation home?: Yes,  Guardian is providing transportation. Do you have the ability to pay for your medications: Yes,  Medicaid  Release of information consent forms completed and in the chart;  Patient's signature needed at discharge.  Patient to Follow up at:  Follow-up Information    [facility] Follow up.   Why: Your guardian has declined assistance in establishing aftercare, however, there is an ACTT available in the area of your group home with the same company that has been your ACTT team.  Thanks Contact information: [address] [place] [address] [phone]               Next level of care provider has access to [facility] Link:no  Safety Planning and Suicide Prevention discussed: Yes,  SPE completed with the patient and guardian.   Have you used any form of tobacco in the last 30 days? (Cigarettes, Smokeless Tobacco, Cigars, and/or Pipes): No  Has patient been referred to the Quitline?: Patient refused referral  Patient has been referred for addiction treatment: Pt. refused referral  Harden Mo, LCSW 07/20/2020, 8:56 AM

## 2020-07-20 NOTE — BHH Group Notes (Signed)
LCSW Group Therapy Note  07/20/2020 2:16 PM  Type of Therapy/Topic:  Group Therapy:  Emotion Regulation  Participation Level:  Did Not Attend   Description of Group:   The purpose of this group is to assist patients in learning to regulate negative emotions and experience positive emotions. Patients will be guided to discuss ways in which they have been vulnerable to their negative emotions. These vulnerabilities will be juxtaposed with experiences of positive emotions or situations, and patients will be challenged to use positive emotions to combat negative ones. Special emphasis will be placed on coping with negative emotions in conflict situations, and patients will process healthy conflict resolution skills.  Therapeutic Goals: 1. Patient will identify two positive emotions or experiences to reflect on in order to balance out negative emotions 2. Patient will label two or more emotions that they find the most difficult to experience 3. Patient will demonstrate positive conflict resolution skills through discussion and/or role plays  Summary of Patient Progress: X  Therapeutic Modalities:   Cognitive Behavioral Therapy Feelings Identification Dialectical Behavioral Therapy  Penni Homans, MSW, LCSW 07/20/2020 2:16 PM

## 2020-07-20 NOTE — Progress Notes (Signed)
Discharge Note:   Pt discharging back to previous group home, picked up by legal guardian, belongings were given to pt and his guardian, pt took wallet and cell phone out of his bag despite request by guardian that he give it to him, given all d/c paperwork given to legal guardian, by MHT and security who escorted pt to legal guardian in the medical mall, and also explained discharge instructions to pt; verbalized understanding.  Pt upon discharge is alert and oriented to person, place, time and situation. Pt denies suicidal and homicidal ideation, denies hallucinations, denies feelings of depression and anxiety. Pt is calm, cooperative, reviewed discharge instructions with pt, which include pt's follow up outpatient appointments, discharge medications, given printed prescriptions, discharge medication education; pt verbalized understanding of all.

## 2020-07-20 NOTE — Plan of Care (Signed)
  Problem: Disruptive/Impulsive Goal: STG - Patient will demonstrate no more than 2 impulsive behaviors during groups within 5 recreation therapy group sessions Description: STG - Patient will demonstrate no more than 2 impulsive behaviors during groups within 5 recreation therapy group sessions 07/20/2020 1246 by Alveria Apley, LRT Outcome: Adequate for Discharge 07/20/2020 1246 by Alveria Apley, LRT Outcome: Adequate for Discharge
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
## 2020-07-20 NOTE — [facility] Suicide Risk Assessment (Signed)
[facility] Discharge Suicide Risk Assessment   Principal Problem: Schizophrenia [facility]) Discharge Diagnoses: Principal Problem:   Schizophrenia (HCC) Active Problems:   Tobacco use disorder   Diabetes (HCC)   HTN (hypertension)   Dyslipidemia   Anxiety state   Total Time spent with patient: 35 minutes- 25 minutes face-to-face contact with patient, 10 minutes documentation, coordination of care, scripts   Musculoskeletal: Strength & Muscle Tone: within normal limits Gait & Station: normal Patient leans: N/A  Psychiatric Specialty Exam: Review of Systems  Constitutional: Negative for appetite change and fatigue.  HENT: Negative for rhinorrhea and sore throat.   Eyes: Negative for photophobia and visual disturbance.  Respiratory: Negative for cough and shortness of breath.   Cardiovascular: Negative for chest pain and palpitations.  Gastrointestinal: Negative for constipation, diarrhea, nausea and vomiting.  Endocrine: Negative for cold intolerance and heat intolerance.  Genitourinary: Negative for difficulty urinating and dysuria.  Musculoskeletal: Negative for arthralgias and myalgias.  Skin: Negative for rash and wound.  Allergic/Immunologic: Negative for food allergies and immunocompromised state.  Neurological: Negative for dizziness and headaches.  Hematological: Negative for adenopathy. Does not bruise/bleed easily.  Psychiatric/Behavioral: Negative for behavioral problems, hallucinations and suicidal ideas.    Blood pressure 117/83, pulse 87, temperature 97.7 F (36.5 C), temperature source Oral, resp. rate 18, height 6' 0.05" (1.83 m), weight 88 kg, SpO2 100 %.Body mass index is 26.28 kg/m.  General Appearance: Well Groomed  Patent attorney::  Good  Speech:  Clear and Coherent and Normal Rate  Volume:  Normal  Mood:  Euthymic  Affect:  Congruent  Thought Process:  Coherent  Orientation:  Full (Time, Place, and Person)  Thought Content:  Logical  Suicidal Thoughts:  No   Homicidal Thoughts:  No  Memory:  Immediate;   Fair Recent;   Fair Remote;   Fair  Judgement:  Intact  Insight:  Present  Psychomotor Activity:  Normal  Concentration:  Fair  Recall:  [employer] of Knowledge:Fair  Language: Fair  Akathisia:  Negative  Handed:  Right  AIMS (if indicated):     Assets:  Communication Skills Desire for Improvement Financial Resources/Insurance Housing Physical Health Resilience Social Support  Sleep:  Number of Hours: 7.5  Cognition: WNL  ADL's:  Intact   Mental Status Per Nursing Assessment::   On Admission:  NA  Demographic Factors:  Male  Loss Factors: NA  Historical Factors: Impulsivity  Risk Reduction Factors:   Religious beliefs about death, Living with another person, especially a relative, Positive social support, Positive therapeutic relationship and Positive coping skills or problem solving skills  Continued Clinical Symptoms:  Schizophrenia:   Paranoid or undifferentiated type Previous Psychiatric Diagnoses and Treatments  Cognitive Features That Contribute To Risk:  None    Suicide Risk:  Minimal: No identifiable suicidal ideation.  Patients presenting with no risk factors but with morbid ruminations; may be classified as minimal risk based on the severity of the depressive symptoms   Follow-up Information    [facility] Follow up.   Contact information: [address] [place] [address] [phone]               Plan Of Care/Follow-up recommendations:  Activity:  as tolerated Diet:  regular diet  Jesse Sans, MD 07/20/2020, 8:18 AM

## 2021-09-03 ENCOUNTER — Emergency Department (HOSPITAL_COMMUNITY)
Admission: EM | Admit: 2021-09-03 | Discharge: 2021-09-03 | Discharge status: Against Medical Advice | Payer: Medicaid Other | Source: Home / Self Care

## 2021-09-03 ENCOUNTER — Other Ambulatory Visit: Payer: Self-pay

## 2021-09-03 ENCOUNTER — Emergency Department (HOSPITAL_COMMUNITY)
Admission: EM | Admit: 2021-09-03 | Discharge: 2021-09-03 | Disposition: A | Discharge status: Home / Self Care | Payer: Medicaid Other | Attending: Emergency Medicine | Admitting: Emergency Medicine

## 2021-09-03 ENCOUNTER — Emergency Department (HOSPITAL_COMMUNITY)
Admission: EM | Admit: 2021-09-03 | Discharge: 2021-09-03 | Disposition: A | Discharge status: Home / Self Care | Payer: Medicaid Other | Source: Home / Self Care | Attending: Emergency Medicine | Admitting: Emergency Medicine

## 2021-09-03 ENCOUNTER — Encounter (HOSPITAL_COMMUNITY): Payer: Self-pay

## 2021-09-03 DIAGNOSIS — D72829 Elevated white blood cell count, unspecified: Secondary | ICD-10-CM | POA: Insufficient documentation

## 2021-09-03 DIAGNOSIS — E119 Type 2 diabetes mellitus without complications: Secondary | ICD-10-CM | POA: Insufficient documentation

## 2021-09-03 DIAGNOSIS — R451 Restlessness and agitation: Secondary | ICD-10-CM | POA: Insufficient documentation

## 2021-09-03 DIAGNOSIS — R519 Headache, unspecified: Secondary | ICD-10-CM

## 2021-09-03 DIAGNOSIS — Z79899 Other long term (current) drug therapy: Secondary | ICD-10-CM | POA: Insufficient documentation

## 2021-09-03 DIAGNOSIS — Z5900 Homelessness unspecified: Secondary | ICD-10-CM | POA: Insufficient documentation

## 2021-09-03 DIAGNOSIS — I10 Essential (primary) hypertension: Secondary | ICD-10-CM | POA: Insufficient documentation

## 2021-09-03 DIAGNOSIS — G4700 Insomnia, unspecified: Secondary | ICD-10-CM | POA: Insufficient documentation

## 2021-09-03 DIAGNOSIS — Z5321 Procedure and treatment not carried out due to patient leaving prior to being seen by health care provider: Secondary | ICD-10-CM | POA: Insufficient documentation

## 2021-09-03 DIAGNOSIS — Z7984 Long term (current) use of oral hypoglycemic drugs: Secondary | ICD-10-CM | POA: Insufficient documentation

## 2021-09-03 DIAGNOSIS — R5383 Other fatigue: Secondary | ICD-10-CM | POA: Insufficient documentation

## 2021-09-03 LAB — CBC
HCT: 43.1 % (ref 39.0–52.0)
Hemoglobin: 13.6 g/dL (ref 13.0–17.0)
MCH: 23.5 pg — ABNORMAL LOW (ref 26.0–34.0)
MCHC: 31.6 g/dL (ref 30.0–36.0)
MCV: 74.6 fL — ABNORMAL LOW (ref 80.0–100.0)
Platelets: 290 10*3/uL (ref 150–400)
RBC: 5.78 MIL/uL (ref 4.22–5.81)
RDW: 17 % — ABNORMAL HIGH (ref 11.5–15.5)
WBC: 11.6 10*3/uL — ABNORMAL HIGH (ref 4.0–10.5)
nRBC: 0 % (ref 0.0–0.2)

## 2021-09-03 LAB — COMPREHENSIVE METABOLIC PANEL
ALT: 33 U/L (ref 0–44)
AST: 42 U/L — ABNORMAL HIGH (ref 15–41)
Albumin: 4.1 g/dL (ref 3.5–5.0)
Alkaline Phosphatase: 42 U/L (ref 38–126)
Anion gap: 6 (ref 5–15)
BUN: 16 mg/dL (ref 6–20)
CO2: 27 mmol/L (ref 22–32)
Calcium: 9.9 mg/dL (ref 8.9–10.3)
Chloride: 107 mmol/L (ref 98–111)
Creatinine, Ser: 0.88 mg/dL (ref 0.61–1.24)
GFR, Estimated: >60 mL/min (ref >60)
Glucose, Bld: 99 mg/dL (ref 70–99)
Potassium: 4.9 mmol/L (ref 3.5–5.1)
Sodium: 140 mmol/L (ref 135–145)
Total Bilirubin: 0.7 mg/dL (ref 0.3–1.2)
Total Protein: 6.9 g/dL (ref 6.5–8.1)

## 2021-09-03 LAB — CBG MONITORING, ED: Glucose-Capillary: 99 mg/dL (ref 70–99)

## 2021-09-03 MED ORDER — ARIPIPRAZOLE 30 MG PO TABS: 30.0000 mg | ORAL_TABLET | Freq: Every day | ORAL | 0 refills | Status: DC

## 2021-09-03 MED ORDER — ACETAMINOPHEN 500 MG PO TABS
1000.0000 mg | ORAL_TABLET | Freq: Once | ORAL | Status: AC
  Administered 2021-09-03: 1000 mg via ORAL
  Filled 2021-09-03: qty 2

## 2021-09-03 MED ORDER — METFORMIN HCL ER 500 MG PO TB24: 500.0000 mg | ORAL_TABLET | Freq: Every day | ORAL | 0 refills | Status: DC

## 2021-09-03 MED ORDER — ARIPIPRAZOLE 10 MG PO TABS
30.0000 mg | ORAL_TABLET | Freq: Every day | ORAL | Status: DC
  Administered 2021-09-03: 30 mg via ORAL
  Filled 2021-09-03: qty 3

## 2021-09-03 MED ORDER — AMLODIPINE BESYLATE 5 MG PO TABS
5.0000 mg | ORAL_TABLET | Freq: Every day | ORAL | Status: DC
  Administered 2021-09-03: 5 mg via ORAL
  Filled 2021-09-03: qty 1

## 2021-09-03 MED ORDER — AMLODIPINE BESYLATE 5 MG PO TABS: 5.0000 mg | ORAL_TABLET | Freq: Every day | ORAL | 0 refills | Status: DC

## 2021-09-03 MED ORDER — TRAZODONE HCL 100 MG PO TABS: 200.0000 mg | ORAL_TABLET | Freq: Every day | ORAL | 0 refills | Status: DC

## 2021-09-03 MED ORDER — GUANFACINE HCL ER 1 MG PO TB24
1.0000 mg | ORAL_TABLET | Freq: Every day | ORAL | Status: DC
  Administered 2021-09-03: 1 mg via ORAL
  Filled 2021-09-03: qty 1

## 2021-09-03 MED ORDER — TRAZODONE HCL 50 MG PO TABS
200.0000 mg | ORAL_TABLET | Freq: Every day | ORAL | Status: DC
  Administered 2021-09-03: 200 mg via ORAL
  Filled 2021-09-03: qty 4

## 2021-09-03 MED ORDER — GUANFACINE HCL ER 1 MG PO TB24: 1.0000 mg | ORAL_TABLET | Freq: Every day | ORAL | 0 refills | Status: DC

## 2021-09-03 NOTE — Discharge Instructions (Signed)
Please be sure to obtain and take your prescriptions as provided.  Follow-up with your physician as soon as possible.  Return here for concerning changes in your condition. ?

## 2021-09-03 NOTE — ED Notes (Signed)
Called pt for triage, no response 

## 2021-09-03 NOTE — ED Triage Notes (Signed)
Pt states that he is homeless and has not been able to sleep in a couple days. Has not been taking meds in a couple days either.  ?

## 2021-09-03 NOTE — ED Notes (Signed)
Pt given [place] sandwich and soda. Respirations even and unlabored. NAD noted ?

## 2021-09-03 NOTE — ED Triage Notes (Addendum)
[facility] [place] due to headache. Pt reports he ran out of his metformin yesterday since then he started to have a headache. ?

## 2021-09-03 NOTE — ED Provider Notes (Signed)
?[name] EMERGENCY DEPARTMENT ?Provider Note ? ? ?CSN: [id number] ?Arrival date & time: 09/03/21  7989 ? ?  ? ?History ? ?Chief Complaint  ?Patient presents with  ? Headache  ? ? ?John Copeland is a 31 y.o. male with a hx of DM, HTN, homelessness, and schizophrenia who presents to the ED with complaints of headache that began a few hours PTA. Frontal, gradual onset, steady progression. Hx of similar headaches, this one just felt somewhat worse. No intervention PTA. Patient also reports he is thirsty. Denies vision change, numbness, weakness, dizziness, seizure, vomiting, or abdominal pain.  ? ?HPI ? ?  ? ?Home Medications ?Prior to Admission medications   ?Medication Sig Start Date End Date Taking? Authorizing Provider  ?amLODipine (NORVASC) 5 MG tablet Take 1 tablet (5 mg total) by mouth daily. 07/20/20   Jesse Sans, MD  ?ARIPiprazole (ABILIFY) 30 MG tablet Take 1 tablet (30 mg total) by mouth daily. 07/20/20   Jesse Sans, MD  ?ARIPiprazole ER (ABILIFY MAINTENA) 400 MG SRER injection Inject 2 mLs (400 mg total) into the muscle every 28 (twenty-eight) days. 08/08/20   Jesse Sans, MD  ?Cholecalciferol (VITAMIN D3) 25 MCG (1000 UT) CAPS Take 2 capsules (2,000 Units total) by mouth daily. 07/19/20   Jesse Sans, MD  ?docusate sodium (COLACE) 100 MG capsule Take 1 capsule (100 mg total) by mouth 2 (two) times daily as needed for mild constipation. 07/19/20   Jesse Sans, MD  ?guanFACINE (INTUNIV) 1 MG TB24 ER tablet Take 1 tablet (1 mg total) by mouth at bedtime. 07/19/20   Jesse Sans, MD  ?metFORMIN (GLUCOPHAGE-XR) 500 MG 24 hr tablet Take 1 tablet (500 mg total) by mouth daily with breakfast. 07/20/20   Jesse Sans, MD  ?nicotine polacrilex (NICORETTE) 2 MG gum Take 1 each (2 mg total) by mouth every 4 (four) hours as needed for smoking cessation. 07/19/20   Jesse Sans, MD  ?pantoprazole (PROTONIX) 20 MG tablet Take 1 tablet (20 mg total) by mouth daily. 07/20/20    Jesse Sans, MD  ?traZODone (DESYREL) 100 MG tablet Take 2 tablets (200 mg total) by mouth at bedtime. 07/19/20   Jesse Sans, MD  ?   ? ?Allergies    ?Risperdal [risperidone]   ? ?Review of Systems   ?Review of Systems  ?Respiratory:  Negative for shortness of breath.   ?Cardiovascular:  Negative for chest pain.  ?Gastrointestinal:  Negative for abdominal pain and vomiting.  ?Genitourinary:  Negative for dysuria.  ?Neurological:  Positive for headaches. Negative for dizziness, seizures, syncope, speech difficulty, weakness and numbness.  ?All other systems reviewed and are negative. ? ?Physical Exam ?Updated Vital Signs ?BP (!) 145/100 (BP Location: Right Arm)   Pulse 77   Temp 98.2 ?F (36.8 ?C) (Oral)   Resp 16   SpO2 100%  ?Physical Exam ?Vitals and nursing note reviewed.  ?Constitutional:   ?   General: He is not in acute distress. ?   Appearance: Normal appearance. He is not toxic-appearing.  ?HENT:  ?   Head: Normocephalic and atraumatic.  ?   Mouth/Throat:  ?   Pharynx: Oropharynx is clear. Uvula midline.  ?Eyes:  ?   General: Vision grossly intact. Gaze aligned appropriately.  ?   Extraocular Movements: Extraocular movements intact.  ?   Conjunctiva/sclera: Conjunctivae normal.  ?   Pupils: Pupils are equal, round, and reactive to light.  ?   Comments: No  proptosis.   ?Cardiovascular:  ?   Rate and Rhythm: Normal rate and regular rhythm.  ?Pulmonary:  ?   Effort: Pulmonary effort is normal.  ?   Breath sounds: Normal breath sounds.  ?Abdominal:  ?   General: There is no distension.  ?   Palpations: Abdomen is soft.  ?   Tenderness: There is no abdominal tenderness. There is no guarding or rebound.  ?Musculoskeletal:  ?   Cervical back: Normal range of motion and neck supple. No rigidity.  ?Skin: ?   General: Skin is warm and dry.  ?Neurological:  ?   Mental Status: He is alert.  ?   Comments: Alert. Clear speech. No facial droop. CNIII-XII grossly intact. Bilateral upper and lower extremities'  sensation grossly intact. 5/5 symmetric strength with grip strength and with plantar and dorsi flexion bilaterally . Normal finger to nose bilaterally. Gait intact.  ?  ?Psychiatric:     ?   Mood and Affect: Mood normal.     ?   Behavior: Behavior normal.  ? ? ?ED Results / Procedures / Treatments   ?Labs ?(all labs ordered are listed, but only abnormal results are displayed) ?Labs Reviewed  ?CBC - Abnormal; Notable for the following components:  ?    Result Value  ? WBC 11.6 (*)   ? MCV 74.6 (*)   ? MCH 23.5 (*)   ? RDW 17.0 (*)   ? All other components within normal limits  ?COMPREHENSIVE METABOLIC PANEL - Abnormal; Notable for the following components:  ? AST 42 (*)   ? All other components within normal limits  ?CBG MONITORING, ED  ? ? ?EKG ?None ? ?Radiology ?No results found. ? ?Procedures ?Procedures  ? ? ?Medications Ordered in ED ?Medications  ?acetaminophen (TYLENOL) tablet 1,000 mg (1,000 mg Oral Given 09/03/21 0322)  ? ? ?ED Course/ Medical Decision Making/ A&P ?  ?                        ?Medical Decision Making ?Risk ?OTC drugs. ? ?Patient presents with complaint of headache. Patient is nontoxic appearing, BP elevated- low suspicion for HTN emergency. Patient has hx of similar headaches, gradual onset with steady progression in severity, he is afebrile with no focal neuro deficits, dizziness, change in vision, proptosis, or nuchal rigidity- low suspicion for for [facility], ICH, ischemic CVA, dural venous sinus thrombosis, acute glaucoma, giant cell arteritis, mass, or meningitis. . ? ?I viewed labs including CBC & CMP: mild leukocytosis & AST elevation, otherwise unremarkable. No findings of DKA. No critical electrolyte derangement.  ? ?Patient treated for headache with tylenol with improvement. He is drinking coffee & requesting discharge which seems appropriate at this time. I discussed results, treatment plan, need for PCP follow-up, and return precautions with the patient. Provided opportunity for  questions, patient confirmed understanding and is in agreement with plan.  ? ? ?Final Clinical Impression(s) / ED Diagnoses ?Final diagnoses:  ?Acute nonintractable headache, unspecified headache type  ? ? ?Rx / DC Orders ?ED Discharge Orders   ? ? None  ? ?  ? ? ?  ?Cherly Anderson, PA-C ?09/03/21 7124 ? ?  ?Sabas Sous, MD ?09/03/21 [phone] ? ?

## 2021-09-03 NOTE — ED Notes (Signed)
RN reviewed discharge instructions with pt. Pt verbalized understanding and had no further questions. VSS upon discharge.  

## 2021-09-03 NOTE — ED Triage Notes (Signed)
Pt was just discharged and checked back in.  Found lying on floor in triage room stating he was tired.  States he just checked back in to see if he can get a bus pass.  ? ?

## 2021-09-03 NOTE — ED Provider Notes (Signed)
?[facility] ?Provider Note ? ? ?CSN: [id number] ?Arrival date & time: 09/03/21  1456 ? ?  ? ?History ? ?Chief Complaint  ?Patient presents with  ? Insomnia  ? ? ?John Copeland is a 31 y.o. male. ? ?HPI ?Patient presents with concern of restlessness, no pain, no syncope.  He notes a history of psychiatric disease, is seemingly transitioning through the area, notes that he has had difficulty obtaining his medication.  He notes that he has not slept in about 2 days.  No suicidal thoughts, no acknowledgment of hallucination. ? ?  ? ?Home Medications ?Prior to Admission medications   ?Medication Sig Start Date End Date Taking? Authorizing Provider  ?amLODipine (NORVASC) 5 MG tablet Take 1 tablet (5 mg total) by mouth daily. 09/03/21   Carmin Muskrat, MD  ?ARIPiprazole (ABILIFY) 30 MG tablet Take 1 tablet (30 mg total) by mouth daily. 09/03/21   Carmin Muskrat, MD  ?ARIPiprazole ER (ABILIFY MAINTENA) 400 MG SRER injection Inject 2 mLs (400 mg total) into the muscle every 28 (twenty-eight) days. 08/08/20   Salley Scarlet, MD  ?Cholecalciferol (VITAMIN D3) 25 MCG (1000 UT) CAPS Take 2 capsules (2,000 Units total) by mouth daily. 07/19/20   Salley Scarlet, MD  ?docusate sodium (COLACE) 100 MG capsule Take 1 capsule (100 mg total) by mouth 2 (two) times daily as needed for mild constipation. 07/19/20   Salley Scarlet, MD  ?guanFACINE (INTUNIV) 1 MG TB24 ER tablet Take 1 tablet (1 mg total) by mouth at bedtime. 09/03/21   Carmin Muskrat, MD  ?metFORMIN (GLUCOPHAGE-XR) 500 MG 24 hr tablet Take 1 tablet (500 mg total) by mouth daily with breakfast. 09/03/21   Carmin Muskrat, MD  ?nicotine polacrilex (NICORETTE) 2 MG gum Take 1 each (2 mg total) by mouth every 4 (four) hours as needed for smoking cessation. 07/19/20   Salley Scarlet, MD  ?pantoprazole (PROTONIX) 20 MG tablet Take 1 tablet (20 mg total) by mouth daily. 07/20/20   Salley Scarlet, MD  ?traZODone (DESYREL) 100 MG  tablet Take 2 tablets (200 mg total) by mouth at bedtime. 09/03/21   Carmin Muskrat, MD  ?   ? ?Allergies    ?Risperdal [risperidone]   ? ?Review of Systems   ?Review of Systems  ?Constitutional:   ?     Per HPI, otherwise negative  ?HENT:    ?     Per HPI, otherwise negative  ?Respiratory:    ?     Per HPI, otherwise negative  ?Cardiovascular:   ?     Per HPI, otherwise negative  ?Gastrointestinal:  Negative for vomiting.  ?Endocrine:  ?     Negative aside from HPI  ?Genitourinary:   ?     Neg aside from HPI   ?Musculoskeletal:   ?     Per HPI, otherwise negative  ?Skin: Negative.   ?Neurological:  Negative for syncope.  ? ?Physical Exam ?Updated Vital Signs ?BP (!) 156/93 (BP Location: Left Arm)   Pulse 90   Temp 97.6 ?F (36.4 ?C) (Oral)   Resp 16   SpO2 98%  ?Physical Exam ?Vitals and nursing note reviewed.  ?Constitutional:   ?   General: He is not in acute distress. ?   Appearance: He is well-developed.  ?HENT:  ?   Head: Normocephalic and atraumatic.  ?Eyes:  ?   Conjunctiva/sclera: Conjunctivae normal.  ?Cardiovascular:  ?   Rate and Rhythm: Normal rate and regular rhythm.  ?  Pulmonary:  ?   Effort: Pulmonary effort is normal. No respiratory distress.  ?   Breath sounds: No stridor.  ?Abdominal:  ?   General: There is no distension.  ?Skin: ?   General: Skin is warm and dry.  ?Neurological:  ?   Mental Status: He is alert and oriented to person, place, and time.  ?Psychiatric:     ?   Mood and Affect: Mood normal.     ?   Behavior: Behavior normal.     ?   Thought Content: Thought content normal.  ? ? ?ED Results / Procedures / Treatments   ?Labs ?(all labs ordered are listed, but only abnormal results are displayed) ?Labs Reviewed - No data to display ? ?EKG ?None ? ?Radiology ?No results found. ? ?Procedures ?Procedures  ? ? ?Medications Ordered in ED ?Medications  ?amLODipine (NORVASC) tablet 5 mg (5 mg Oral Given 09/03/21 1759)  ?ARIPiprazole (ABILIFY) tablet 30 mg (30 mg Oral Given 09/03/21 1758)   ?traZODone (DESYREL) tablet 200 mg (200 mg Oral Given 09/03/21 1758)  ?guanFACINE (INTUNIV) ER tablet 1 mg (1 mg Oral Given 09/03/21 1759)  ? ? ?ED Course/ Medical Decision Making/ A&P ?This patient with a Hx of GERD for disease, hypertension presents to the ED for concern of insomnia, restlessness, this involves an extensive number of treatment options, and is a complaint that carries with it a high risk of complications and morbidity.   ? ?The differential diagnosis includes medication noncompliance, manic ? ? ?Social Determinants of Health: ? ?Psychiatric disease, homelessness ? ?Additional history obtained: ? ?Additional history and/or information obtained from chart review, notable for multiple ED evaluations in the past few weeks ? ? ?After the initial evaluation, orders, including: Medications provided including home meds after he and I discussed importance of taking these, obtaining new refill, which was provided to him.  Were initiated. ? ? ? ? ? ?On repeat evaluation of the patient improved ? ? ?Dispostion / Final MDM: ? ?After consideration of the diagnostic results and the patient's response to treatment, patient was discharged, to obtain his medications which were provided, follow-up with outpatient clinic. ?This adult male with history of restlessness, psychiatric disease is awake, alert, without homicidal, suicidal ideation, with no physical complaints, and with reassuring physical exam, vital signs. ? ?Final Clinical Impression(s) / ED Diagnoses ?Final diagnoses:  ?Restlessness  ? ? ?Rx / DC Orders ?ED Discharge Orders   ? ?      Ordered  ?  amLODipine (NORVASC) 5 MG tablet  Daily       ? 09/03/21 1732  ?  ARIPiprazole (ABILIFY) 30 MG tablet  Daily       ? 09/03/21 1732  ?  guanFACINE (INTUNIV) 1 MG TB24 ER tablet  Daily at bedtime       ? 09/03/21 1732  ?  metFORMIN (GLUCOPHAGE-XR) 500 MG 24 hr tablet  Daily with breakfast       ? 09/03/21 1732  ?  traZODone (DESYREL) 100 MG tablet  Daily at bedtime,    Status:  Discontinued       ? 09/03/21 1732  ?  traZODone (DESYREL) 100 MG tablet  Daily at bedtime       ? 09/03/21 1732  ? ?  ?  ? ?  ? ? ?  ?Carmin Muskrat, MD ?09/03/21 1816 ? ?

## 2021-09-03 NOTE — Discharge Instructions (Addendum)
You were seen in the emergency department today for a headache.  Your blood work was overall reassuring, your white blood cell count and one of your liver function test were very mildly elevated, please have this rechecked by your primary care provider.  Your blood pressure was also elevated, please have this rechecked as well.  Please take Tylenol per over-the-counter dosing to help with pain.  Please follow-up with primary care within 3 days.  Return to the ER for new or worsening symptoms including but not limited to new or worsening pain, sudden change in pain, change in your vision, vomiting, fevers, numbness, weakness, trouble walking, or any other concerns. ?

## 2021-09-04 ENCOUNTER — Other Ambulatory Visit (HOSPITAL_COMMUNITY)
Admission: EM | Admit: 2021-09-04 | Discharge: 2021-09-05 | Disposition: A | Discharge status: Home / Self Care | Payer: No Typology Code available for payment source | Attending: Psychiatry | Admitting: Psychiatry

## 2021-09-04 DIAGNOSIS — F419 Anxiety disorder, unspecified: Secondary | ICD-10-CM | POA: Insufficient documentation

## 2021-09-04 DIAGNOSIS — F259 Schizoaffective disorder, unspecified: Secondary | ICD-10-CM | POA: Insufficient documentation

## 2021-09-04 DIAGNOSIS — Z5900 Homelessness unspecified: Secondary | ICD-10-CM

## 2021-09-04 DIAGNOSIS — F10129 Alcohol abuse with intoxication, unspecified: Secondary | ICD-10-CM | POA: Diagnosis not present

## 2021-09-04 DIAGNOSIS — F1290 Cannabis use, unspecified, uncomplicated: Secondary | ICD-10-CM | POA: Insufficient documentation

## 2021-09-04 DIAGNOSIS — F32A Depression, unspecified: Secondary | ICD-10-CM | POA: Diagnosis not present

## 2021-09-04 DIAGNOSIS — F1020 Alcohol dependence, uncomplicated: Secondary | ICD-10-CM | POA: Diagnosis present

## 2021-09-04 DIAGNOSIS — F209 Schizophrenia, unspecified: Secondary | ICD-10-CM

## 2021-09-04 DIAGNOSIS — Z20822 Contact with and (suspected) exposure to covid-19: Secondary | ICD-10-CM | POA: Insufficient documentation

## 2021-09-05 DIAGNOSIS — F418 Other specified anxiety disorders: Secondary | ICD-10-CM | POA: Diagnosis not present

## 2021-09-05 DIAGNOSIS — F1020 Alcohol dependence, uncomplicated: Secondary | ICD-10-CM | POA: Diagnosis present

## 2021-09-05 DIAGNOSIS — Z5900 Homelessness unspecified: Secondary | ICD-10-CM | POA: Diagnosis not present

## 2021-09-05 DIAGNOSIS — F259 Schizoaffective disorder, unspecified: Secondary | ICD-10-CM | POA: Diagnosis not present

## 2021-09-05 DIAGNOSIS — F419 Anxiety disorder, unspecified: Secondary | ICD-10-CM | POA: Diagnosis not present

## 2021-09-05 DIAGNOSIS — F10129 Alcohol abuse with intoxication, unspecified: Secondary | ICD-10-CM

## 2021-09-05 DIAGNOSIS — F32A Depression, unspecified: Secondary | ICD-10-CM | POA: Diagnosis not present

## 2021-09-05 LAB — COMPREHENSIVE METABOLIC PANEL
ALT: 31 U/L (ref 0–44)
AST: 33 U/L (ref 15–41)
Albumin: 3.9 g/dL (ref 3.5–5.0)
Alkaline Phosphatase: 44 U/L (ref 38–126)
Anion gap: 7 (ref 5–15)
BUN: 19 mg/dL (ref 6–20)
CO2: 26 mmol/L (ref 22–32)
Calcium: 9.4 mg/dL (ref 8.9–10.3)
Chloride: 104 mmol/L (ref 98–111)
Creatinine, Ser: 0.88 mg/dL (ref 0.61–1.24)
GFR, Estimated: >60 mL/min (ref >60)
Glucose, Bld: 121 mg/dL — ABNORMAL HIGH (ref 70–99)
Potassium: 4.2 mmol/L (ref 3.5–5.1)
Sodium: 137 mmol/L (ref 135–145)
Total Bilirubin: 0.6 mg/dL (ref 0.3–1.2)
Total Protein: 6 g/dL — ABNORMAL LOW (ref 6.5–8.1)

## 2021-09-05 LAB — POCT URINE DRUG SCREEN - MANUAL ENTRY (I-SCREEN)
POC Amphetamine UR: NOT DETECTED
POC Buprenorphine (BUP): NOT DETECTED
POC Cocaine UR: NOT DETECTED
POC Marijuana UR: NOT DETECTED
POC Methadone UR: NOT DETECTED
POC Methamphetamine UR: NOT DETECTED
POC Morphine: NOT DETECTED
POC Oxazepam (BZO): POSITIVE — AB
POC Oxycodone UR: NOT DETECTED
POC Secobarbital (BAR): NOT DETECTED

## 2021-09-05 LAB — CBC WITH DIFFERENTIAL/PLATELET
Abs Immature Granulocytes: 0.02 10*3/uL (ref 0.00–0.07)
Basophils Absolute: 0.1 10*3/uL (ref 0.0–0.1)
Basophils Relative: 1 %
Eosinophils Absolute: 0.1 10*3/uL (ref 0.0–0.5)
Eosinophils Relative: 1 %
HCT: 38.5 % — ABNORMAL LOW (ref 39.0–52.0)
Hemoglobin: 12.2 g/dL — ABNORMAL LOW (ref 13.0–17.0)
Immature Granulocytes: 0 %
Lymphocytes Relative: 30 %
Lymphs Abs: 2.5 10*3/uL (ref 0.7–4.0)
MCH: 23.1 pg — ABNORMAL LOW (ref 26.0–34.0)
MCHC: 31.7 g/dL (ref 30.0–36.0)
MCV: 72.8 fL — ABNORMAL LOW (ref 80.0–100.0)
Monocytes Absolute: 0.4 10*3/uL (ref 0.1–1.0)
Monocytes Relative: 5 %
Neutro Abs: 5.4 10*3/uL (ref 1.7–7.7)
Neutrophils Relative %: 63 %
Platelets: 283 10*3/uL (ref 150–400)
RBC: 5.29 MIL/uL (ref 4.22–5.81)
RDW: 15.8 % — ABNORMAL HIGH (ref 11.5–15.5)
WBC: 8.5 10*3/uL (ref 4.0–10.5)
nRBC: 0 % (ref 0.0–0.2)

## 2021-09-05 LAB — LIPID PANEL
Cholesterol: 192 mg/dL (ref 0–200)
HDL: 79 mg/dL (ref >40)
LDL Cholesterol: 96 mg/dL (ref 0–99)
Total CHOL/HDL Ratio: 2.4 RATIO
Triglycerides: 83 mg/dL (ref <150)
VLDL: 17 mg/dL (ref 0–40)

## 2021-09-05 LAB — MAGNESIUM: Magnesium: 2.2 mg/dL (ref 1.7–2.4)

## 2021-09-05 LAB — RESP PANEL BY RT-PCR (FLU A&B, COVID) ARPGX2
Influenza A by PCR: NEGATIVE
Influenza B by PCR: NEGATIVE
SARS Coronavirus 2 by RT PCR: NEGATIVE

## 2021-09-05 LAB — HEMOGLOBIN A1C
Hgb A1c MFr Bld: 5.3 % (ref 4.8–5.6)
Mean Plasma Glucose: 105.41 mg/dL

## 2021-09-05 LAB — POC SARS CORONAVIRUS 2 AG: SARSCOV2ONAVIRUS 2 AG: NEGATIVE

## 2021-09-05 LAB — TSH: TSH: 0.728 u[IU]/mL (ref 0.350–4.500)

## 2021-09-05 LAB — ETHANOL: Alcohol, Ethyl (B): <10 mg/dL (ref <10)

## 2021-09-05 MED ORDER — MAGNESIUM HYDROXIDE 400 MG/5ML PO SUSP: 30.0000 mL | Freq: Every day | ORAL | Status: DC | PRN

## 2021-09-05 MED ORDER — HYDROXYZINE HCL 25 MG PO TABS: 25.0000 mg | ORAL_TABLET | Freq: Three times a day (TID) | ORAL | Status: DC | PRN

## 2021-09-05 MED ORDER — LORAZEPAM 1 MG PO TABS: 1.0000 mg | ORAL_TABLET | Freq: Four times a day (QID) | ORAL | Status: DC | PRN

## 2021-09-05 MED ORDER — NAPROXEN 500 MG PO TABS: 500.0000 mg | ORAL_TABLET | Freq: Two times a day (BID) | ORAL | Status: DC | PRN

## 2021-09-05 MED ORDER — LOPERAMIDE HCL 2 MG PO CAPS: 2.0000 mg | ORAL_CAPSULE | ORAL | Status: DC | PRN

## 2021-09-05 MED ORDER — CALAMINE EX LOTN: 1.0000 "application " | TOPICAL_LOTION | CUTANEOUS | Status: DC | PRN

## 2021-09-05 MED ORDER — THIAMINE HCL 100 MG/ML IJ SOLN
100.0000 mg | Freq: Once | INTRAMUSCULAR | Status: AC
  Administered 2021-09-05: 100 mg via INTRAMUSCULAR
  Filled 2021-09-05: qty 2

## 2021-09-05 MED ORDER — ACETAMINOPHEN 325 MG PO TABS: 650.0000 mg | ORAL_TABLET | Freq: Four times a day (QID) | ORAL | Status: DC | PRN

## 2021-09-05 MED ORDER — LORAZEPAM 1 MG PO TABS: 1.0000 mg | ORAL_TABLET | Freq: Two times a day (BID) | ORAL | Status: DC

## 2021-09-05 MED ORDER — THIAMINE HCL 100 MG PO TABS: 100.0000 mg | ORAL_TABLET | Freq: Every day | ORAL | Status: DC

## 2021-09-05 MED ORDER — METHOCARBAMOL 500 MG PO TABS: 500.0000 mg | ORAL_TABLET | Freq: Three times a day (TID) | ORAL | Status: DC | PRN

## 2021-09-05 MED ORDER — DICYCLOMINE HCL 20 MG PO TABS: 20.0000 mg | ORAL_TABLET | Freq: Four times a day (QID) | ORAL | Status: DC | PRN

## 2021-09-05 MED ORDER — LORAZEPAM 1 MG PO TABS
1.0000 mg | ORAL_TABLET | Freq: Every day | ORAL | Status: DC
Start: 2021-09-08 — End: 2021-09-05

## 2021-09-05 MED ORDER — ONDANSETRON 4 MG PO TBDP: 4.0000 mg | ORAL_TABLET | Freq: Four times a day (QID) | ORAL | Status: DC | PRN

## 2021-09-05 MED ORDER — LORAZEPAM 1 MG PO TABS: 1.0000 mg | ORAL_TABLET | Freq: Four times a day (QID) | ORAL | Status: DC

## 2021-09-05 MED ORDER — ADULT MULTIVITAMIN W/MINERALS CH
1.0000 | ORAL_TABLET | Freq: Every day | ORAL | Status: DC
  Administered 2021-09-05: 1 via ORAL
  Filled 2021-09-05: qty 1

## 2021-09-05 MED ORDER — ALUM & MAG HYDROXIDE-SIMETH 200-200-20 MG/5ML PO SUSP: 30.0000 mL | ORAL | Status: DC | PRN

## 2021-09-05 MED ORDER — LORAZEPAM 1 MG PO TABS: 1.0000 mg | ORAL_TABLET | Freq: Three times a day (TID) | ORAL | Status: DC

## 2021-09-05 NOTE — ED Notes (Signed)
Pt is currently sleeping, no distress noted, environmental check complete, will continue to monitor patient for safety. ? ?

## 2021-09-05 NOTE — ED Notes (Signed)
Pt alert and oriented. Denies pain, SI, HI and AVH.  Pt presents slightly agitated this morning requesting his clothes be laundered.  Clothing placed in laundry.  Pt continue to check in with staff about clothes.  Informed pt that we will let him know when his laundry is complete.  Pt returned to room to lie down.  Breakfast provided.  Breathing is even and unlabored.  Will continue to monitor for safety. ?

## 2021-09-05 NOTE — ED Notes (Signed)
Pending 2 hr Covid and transfer to [facility]. ?

## 2021-09-05 NOTE — ED Notes (Signed)
Pt sleeping in no acute distress. RR even and unlabored. Safety maintained. 

## 2021-09-05 NOTE — ED Provider Notes (Signed)
Facility Based Crisis Admission H&P ? ?Date: 09/05/21 ?Patient Name: John Copeland ?MRN: [id number] ?Chief Complaint:  ?Chief Complaint  ?Patient presents with  ? Alcohol Problem  ?   ? ?Diagnoses:  ?Final diagnoses:  ?Alcohol abuse with intoxication ([place])  ?Homelessness  ?Anxiety and depression  ? ? ?HPI: John Copeland, 31 y.o male,  present to [facility] asking to be detox,  per the patient he drink beer , hand sanitizer or anything he can get his hands on.  Per report he is homeless, and unemployed.  According to patient he was living in a group home but the kick him out because of behavioral issues.   Pt report he has family in the [place] area but they will not let him stay with them. Per the patient he wants to detox and go to a half way house.  ? ?Observation of patient,  He is alert and oriented x 4,  speech clear, mood depressed, withdrawn and constricted,  affect flat.  Pt denies SI,  HI,  AVH or paranoia.  Pt reported alcohol use for many years.  Cant put a total in the amount he drinks each day.  Pt is requesting detox after which he wants to go to a half way house.  ? ?Recommend inpatient [facility]BC  ?PHQ 2-9:  ?Flowsheet Row ED from 09/04/2021 in [facility]  ?Thoughts that you would be better off dead, or of hurting yourself in some way Not at all  ?PHQ-9 Total Score 8  ? ?  ?  ?[place] ED from 09/04/2021 in [facility] ?Most recent reading at 09/05/2021 12:01 AM ED from 09/03/2021 in [place] ?Most recent reading at 09/03/2021  7:56 PM ED from 09/03/2021 in [place] ?Most recent reading at 09/03/2021  3:33 PM  ?C-SSRS RISK CATEGORY Low Risk No Risk No Risk  ? ?  ?  ? ?Total Time spent with patient: 20 minutes ? ?Musculoskeletal  ?Strength & Muscle Tone: within normal limits ?Gait & Station: normal ?Patient leans: N/A ? ?Psychiatric Specialty Exam   ?Presentation ?General Appearance: Casual ? ?Eye Contact:None; Poor ? ?Speech:Clear and Coherent ? ?Speech Volume:Normal ? ?Handedness:Ambidextrous ? ? ?Mood and Affect  ?Mood:Anxious; Hopeless ? ?Affect:Restricted ? ? ?Thought Process  ?Thought Processes:Coherent ? ?Descriptions of Associations:Circumstantial ? ?Orientation:Full (Time, Place and Person) ? ?Thought Content:Abstract Reasoning ? Diagnosis of Schizophrenia or Schizoaffective disorder in past: Yes ?  ?Hallucinations:Hallucinations: None ? ?Ideas of Reference:None ? ?Suicidal Thoughts:Suicidal Thoughts: No ? ?Homicidal Thoughts:Homicidal Thoughts: No ? ? ?Sensorium  ?Memory:Immediate Fair ? ?Judgment:Poor ? ?Insight:Poor ? ? ?Executive Functions  ?Concentration:Poor ? ?Attention Span:Poor ? ?Recall:Fair ? ?[place] ? ?Language:Fair ? ? ?Psychomotor Activity  ?Psychomotor Activity:Psychomotor Activity: Normal ? ? ?Assets  ?Assets:No data recorded ? ?Sleep  ?Sleep:Sleep: Poor ? ? ?Nutritional Assessment (For OBS and FBC admissions only) ?Has the patient had a weight loss or gain of 10 pounds or more in the last 3 months?: No ?Has the patient had a decrease in food intake/or appetite?: No ?Does the patient have dental problems?: No ?Does the patient have eating habits or behaviors that may be indicators of an eating disorder including binging or inducing vomiting?: No ?Has the patient recently lost weight without trying?: 0 ?Has the patient been eating poorly because of a decreased appetite?: 0 ?Malnutrition Screening Tool Score: 0 ? ? ? ?Physical Exam ?HENT:  ?   Head: Normocephalic.  ?  Nose: Nose normal.  ?Cardiovascular:  ?   Rate and Rhythm: Normal rate.  ?Pulmonary:  ?   Effort: Pulmonary effort is normal.  ?Musculoskeletal:     ?   General: Normal range of motion.  ?   Cervical back: Normal range of motion.  ?Skin: ?   General: Skin is warm.  ?Neurological:  ?   General: No focal deficit present.  ?   Mental Status: He is alert.   ?Psychiatric:     ?   Behavior: Behavior normal.     ?   Judgment: Judgment normal.  ? ?Review of Systems  ?Constitutional: Negative.   ?HENT: Negative.    ?Eyes: Negative.   ?Respiratory: Negative.    ?Cardiovascular: Negative.   ?Gastrointestinal: Negative.   ?Genitourinary: Negative.   ?Musculoskeletal: Negative.   ?Skin: Negative.   ?Neurological: Negative.   ?Endo/Heme/Allergies: Negative.   ?Psychiatric/Behavioral:  Positive for substance abuse. The patient is nervous/anxious.   ? ?Blood pressure (!) 142/96, pulse 96, temperature 98.5 ?F (36.9 ?C), temperature source Oral, resp. rate 18, SpO2 99 %. There is no height or weight on file to calculate BMI. ? ?Past Psychiatric History: alcohol abuse, Schizophrenia disorder,  SI,  Depression  ? ?Is the patient at risk to self? No  ?Has the patient been a risk to self in the past 6 months? No .    ?Has the patient been a risk to self within the distant past? No   ?Is the patient a risk to others? No   ?Has the patient been a risk to others in the past 6 months? No   ?Has the patient been a risk to others within the distant past? No  ? ?Past Medical History:  ?Past Medical History:  ?Diagnosis Date  ? Anxiety   ? Depression   ? Diabetes mellitus without complication ([place])   ? Homelessness   ? Hypertension   ? Schizophrenia ([place])   ? No past surgical history on file. ? ?Family History:  ?Family History  ?Problem Relation Age of Onset  ? Diabetes Other   ? ? ?Social History:  ?Social History  ? ?Socioeconomic History  ? Marital status: Single  ?  Spouse name: Not on file  ? Number of children: Not on file  ? Years of education: Not on file  ? Highest education level: Not on file  ?Occupational History  ? Not on file  ?Tobacco Use  ? Smoking status: Former  ?  Packs/day: 0.00  ?  Types: Cigarettes  ? Smokeless tobacco: Never  ?Vaping Use  ? Vaping Use: Never used  ?Substance and Sexual Activity  ? Alcohol use: No  ? Drug use: Yes  ?  Types: Marijuana  ?  Comment: denies  drug use  ? Sexual activity: Not Currently  ?Other Topics Concern  ? Not on file  ?Social History Narrative  ? Not on file  ? ?Social Determinants of Health  ? ?Financial Resource Strain: Not on file  ?Food Insecurity: Not on file  ?Transportation Needs: Not on file  ?Physical Activity: Not on file  ?Stress: Not on file  ?Social Connections: Not on file  ?Intimate Partner Violence: Not on file  ? ? ?SDOH:  ?SDOH Screenings  ? ?Alcohol Screen: Not on file  ?Depression (PHQ2-9): Medium Risk  ? PHQ-2 Score: 8  ?Financial Resource Strain: Not on file  ?Food Insecurity: Not on file  ?Housing: Not on file  ?Physical Activity: Not on file  ?Social Connections:  Not on file  ?Stress: Not on file  ?Tobacco Use: Medium Risk  ? Smoking Tobacco Use: Former  ? Smokeless Tobacco Use: Never  ? Passive Exposure: Not on file  ?Transportation Needs: Not on file  ? ? ?Last Labs:  ?Admission on 09/03/2021, Discharged on 09/03/2021  ?Component Date Value Ref Range Status  ? WBC 09/03/2021 11.6 (H)  4.0 - 10.5 K/uL Final  ? RBC 09/03/2021 5.78  4.22 - 5.81 MIL/uL Final  ? Hemoglobin 09/03/2021 13.6  13.0 - 17.0 g/dL Final  ? HCT 09/03/2021 43.1  39.0 - 52.0 % Final  ? MCV 09/03/2021 74.6 (L)  80.0 - 100.0 fL Final  ? MCH 09/03/2021 23.5 (L)  26.0 - 34.0 pg Final  ? MCHC 09/03/2021 31.6  30.0 - 36.0 g/dL Final  ? RDW 09/03/2021 17.0 (H)  11.5 - 15.5 % Final  ? Platelets 09/03/2021 290  150 - 400 K/uL Final  ? nRBC 09/03/2021 0.0  0.0 - 0.2 % Final  ? Performed at [facility], [place] [address], [place], [place] [address]  ? Sodium 09/03/2021 140  135 - 145 mmol/L Final  ? Potassium 09/03/2021 4.9  3.5 - 5.1 mmol/L Final  ? Chloride 09/03/2021 107  98 - 111 mmol/L Final  ? CO2 09/03/2021 27  22 - 32 mmol/L Final  ? Glucose, Bld 09/03/2021 99  70 - 99 mg/dL Final  ? Glucose reference range applies only to samples taken after fasting for at least 8 hours.  ? BUN 09/03/2021 16  6 - 20 mg/dL Final  ? Creatinine, Ser 09/03/2021 0.88  0.61 -  1.24 mg/dL Final  ? Calcium 09/03/2021 9.9  8.9 - 10.3 mg/dL Final  ? Total Protein 09/03/2021 6.9  6.5 - 8.1 g/dL Final  ? Albumin 09/03/2021 4.1  3.5 - 5.0 g/dL Final  ? AST 09/03/2021 42 (H)  15 - 41 U/L Final  ? ALT 04/23

## 2021-09-05 NOTE — ED Notes (Signed)
Pt up walking hallway asking staff questions about being discharged and who he needs to speak with.  Informed pt he would need to speak with the provider.  Notified provider that pt would like to speak with her.  Pt continues to ask the same questions over and over to different staff.  Staff continues to answer questions and redirect pt.  Breathing is even and unlabored.   Will continue to monitor for safety. ?

## 2021-09-05 NOTE — ED Notes (Signed)
Pt discharged with  AVS.  AVS reviewed prior to discharge.  Pt alert, oriented, and ambulatory.  Pt given 2 bus passes, states he is going to go to the [facility] then [facility]. Safety maintained.  ?

## 2021-09-05 NOTE — ED Notes (Signed)
Pt woke up with complaint of cold sore on the side of his mouth, this nurse offered patient some Vaseline, however patient stated that he just wanted an alcohol wipe to wipe it. The nurse watched patient wipe the side of his mouth with alcohol wipe and discard it. Patient then wanted this nurse to get him a pair of clothes and some shoes from his locker. This nurse advised the patient that I would have to take the shoe strings out of his shoes. The nurse got patient one pair of clothes and a pair of shoeless shoe strings from his bags. ?

## 2021-09-05 NOTE — Discharge Instructions (Signed)
Take all medications as prescribed by his/her mental healthcare provider. ?Report any adverse effects and or reactions from the medicines to your outpatient provider promptly. ?Do not engage in alcohol and or illegal drug use while on prescription medicines. ?In the event of worsening symptoms, call the crisis hotline, 911 and or go to the nearest ED for appropriate evaluation and treatment of symptoms. ?follow-up with your primary care provider for your other medical issues, concerns and or health care needs. ? ? ?Please come to [facility] (this facility) during walk in hours for appointment with psychiatrist/provider for further medication management and for therapists for therapy.  ? ? Walk-Ins for medication management  are available on Monday, Wednesday, Thursday and Friday from 8am-11am.  It is first come, first -serve; it is best to arrive by 7:00 AM.  ? ? Walk-Ins for therapy are  available on Monday and Wednesday?s  8am-11am.  It is first come, first -serve; it is best to arrive by 7:00 AM.  ? ? ?When you arrive please go upstairs for your appointment. If you are unsure of where to go, inform the front desk that you are here for a walk in appointment and they will assist you with directions upstairs. ? ?Address:  ?[address], in [place], [address] ?Ph: [phone]  ? ? ? ? ? ?

## 2021-09-05 NOTE — ED Notes (Signed)
Per MHT patient refused EKG ?

## 2021-09-05 NOTE — BH Assessment (Signed)
Comprehensive Clinical Assessment (CCA) Note ? ?09/05/2021 ?John Copeland ?[id number] ? ?Discharge Disposition: ?Evette Georges, NP, reviewed pt's chart and information and met with pt face-to-face and determined pt meets criteria for [facility] services. Pt has been accepted to [facility]. ? ?The patient demonstrates the following risk factors for suicide: Chronic risk factors for suicide include: psychiatric disorder of Alcohol Abuse, Severe, substance use disorder, and previous suicide attempts , most recently at age 29 . Acute risk factors for suicide include: social withdrawal/isolation and loss (financial, interpersonal, professional). Protective factors for this patient include: hope for the future. Considering these factors, the overall suicide risk at this point appears to be low. Patient is not appropriate for outpatient follow up. ? ?Therefore, a tele-sitter is recommended for suicide precautions. ? ?[place] ED from 09/04/2021 in [facility] ?Most recent reading at 09/05/2021 12:01 AM ED from 09/03/2021 in [place] ?Most recent reading at 09/03/2021  7:56 PM ED from 09/03/2021 in [place] ?Most recent reading at 09/03/2021  3:33 PM  ?C-SSRS RISK CATEGORY Low Risk No Risk No Risk  ? ?  ?Chief Complaint:  ?Chief Complaint  ?Patient presents with  ? Alcohol Problem  ? ?Visit Diagnosis: Alcohol Abuse - Severe ? ?CCA Screening, Triage and Referral (STR) ?John Copeland is a 31 year old patient who came to the [facility] voluntarily. 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 Pt shares that the last time he attempted to get detox help was in [place] at age 79; however, pt later states he was hospitalized in [place] 1 1/2 weeks ago.  ? ?Pt denies he currently has a  plan to kill himself. Pt denies HI, AVH, NSSIB, access to guns/weapons, or engagement with the legal system. Pt shares he drank 1 8-12 ounce beer as well as 3-4 (approximately) 2-ounce containers of hand sanitizer. He shares he has been drinking on a daily basis. Pt shares he also took several drags of marijuana 7-9 days ago. ? ?A call to pt's legal guardian at [phone] was not answered; a HIPAA-compliant voicemail message was left requesting a returned call. ? ?Pt is oriented x5. His recent/remote memory is intact. Pt was cooperative throughout the assessment process. Pt's insight, judgement, and impulse control is poor at this time. ? ?Corey Harold, legal guardian through [facility]: [phone] ? ?Patient Reported Information ?How did you hear about [place]? Self ? ?What Is the Reason for Your Visit/Call Today? 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 Pt shares that the last time he attempted to get detox help was in [place] at age 81; however, pt later states he was hospitalized in [place] 1 1/2 weeks ago. Pt denies he currently has a plan to kil himself. Pt denies HI, AVH, NSSIB, access to guns/weapons, or engagement with the legal system. Pt shares he drank 1 8-12 ounce beer as well as 3-4 (approximately) 2-ounce containers of hand sanitizer. He shares he has been drinking on a daily basis. Pt shares he also took several drags of marijuana 7-9 days ago. ? ?How Long Has This Been Causing You Problems? >  than 6 months ? ?What Do You Feel Would Help You the Most Today? Alcohol or Drug Use Treatment; Medication(s); Housing Assistance ? ? ?Have You Recently Had Any Thoughts About Hurting Yourself? No ? ?Are You Planning to Commit Suicide/Harm Yourself At This time? No ? ? ?Have you Recently Had Thoughts About [place]? No ? ?Are You Planning to  Harm Someone at This Time? No ? ?Explanation: No data recorded ? ?Have You Used Any Alcohol or Drugs in the Past 24 Hours? Yes ? ?How Long Ago Did You Use Drugs or Alcohol? No data recorded ?What Did You Use and How Much? Pt shares he drank 1 8-12 ounce beer as well as 3-4 (approximately) 2-ounce containers of hand sanitizer. He shares he has been drinking on a daily basis. Pt shares he also took several drags of marijuana 7-9 days ago. ? ? ?Do You Currently Have a Therapist/Psychiatrist? No ? ?Name of Therapist/Psychiatrist: No data recorded ? ?Have You Been Recently Discharged From Any Office Practice or Programs? Yes ? ?Explanation of Discharge From Practice/Program: Pt states he was d/c from [facility] 1 1/2 weeks ago ? ? ?  ?CCA Screening Triage Referral Assessment ?Type of Contact: Face-to-Face ? ?Telemedicine Service Delivery:   ?Is this Initial or Reassessment? No data recorded ?Date Telepsych consult ordered in [facility]:  No data recorded ?Time Telepsych consult ordered in [facility]:  No data recorded ?Location of Assessment: [facility] ? ?Provider Location: [facility] Assessment Services ? ? ?Collateral Involvement: Corey Harold, [facility] legal guardian: [phone] - HIPAA-compliant voicemail message left at [phone] ? ? ?Does Patient Have a Stage manager Guardian? No data recorded ?Name and Contact of Legal Guardian: No data recorded ?If Minor and Not Living with Parent(s), Who has Custody? N/A ? ?Is CPS involved or ever been involved? Never ? ?Is APS involved or ever been involved? In the past ? ? ?Patient Determined To Be At Risk for Harm To Self or Others Based on Review of Patient Reported Information or Presenting Complaint? No ? ?Method: No data recorded ?Availability of Means: No data recorded ?Intent: No data recorded ?Notification Required: No data recorded ?Additional Information for Danger to Others Potential: No data recorded ?Additional Comments for Danger to Others  Potential: No data recorded ?Are There Guns or Other Weapons in [place]? No data recorded ?Types of Guns/Weapons: No data recorded ?Are These Weapons Safely Secured?                            No data recorded ?Who Could Verify You Are Able To Have These Secured: No data recorded ?Do You Have any Outstanding Charges, Pending Court Dates, Parole/Probation? No data recorded ?Contacted To Inform of Risk of Harm To Self or Others: Guardian/MH POA: ? ? ? ?Does Patient Present under Involuntary Commitment? No ? ?IVC Papers Initial File Date: No data recorded ? ?[place] of Residence: Kathleen Argue ? ? ?Patient Currently Receiving the Following Services: Not Receiving Services ? ? ?Determination of Need: Urgent (48 hours) ? ? ?Options For Referral: Medication Management; Outpatient Therapy; Facility-Based Crisis ? ? ? ? ?CCA Biopsychosocial ?Patient Reported Schizophrenia/Schizoaffective Diagnosis in Past: Yes ? ? ?Strengths: Pt is able to identify he is in need of assistance for his EtOH abuse. ? ? ?Mental Health Symptoms ?Depression:   ?Increase/decrease in appetite ?  ?Duration of Depressive symptoms:  ?Duration of Depressive Symptoms: Greater than two weeks ?  ?  Mania:   ?None ?  ?Anxiety:    ?Tension; Worrying ?  ?Psychosis:   ?None ?  ?Duration of Psychotic symptoms:    ?Trauma:   ?None ?  ?Obsessions:   ?None ?  ?Compulsions:   ?None ?  ?Inattention:   ?None ?  ?Hyperactivity/Impulsivity:   ?N/A ?  ?Oppositional/Defiant Behaviors:   ?None ?  ?Emotional Irregularity:   ?None ?  ?Other Mood/Personality Symptoms:   ?None noted ?  ? ?Mental Status Exam ?Appearance and self-care  ?Stature:   ?Average ?  ?Weight:   ?Average weight ?  ?Clothing:   ?Neat/clean ?  ?Grooming:   ?Normal ?  ?Cosmetic use:   ?None ?  ?Posture/gait:   ?Normal ?  ?Motor activity:   ?Not Remarkable ?  ?Sensorium  ?Attention:   ?Normal ?  ?Concentration:   ?Normal ?  ?Orientation:   ?X5 ?  ?Recall/memory:   ?Normal ?  ?Affect and Mood  ?Affect:    ?Appropriate ?  ?Mood:   ?Euthymic ?  ?Relating  ?Eye contact:   ?Normal ?  ?Facial expression:   ?Responsive ?  ?Attitude toward examiner:   ?Cooperative ?  ?Thought and Language  ?Speech flow:  ?Clear and [place]
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
## 2021-09-06 ENCOUNTER — Other Ambulatory Visit: Payer: Self-pay

## 2021-09-06 ENCOUNTER — Encounter (HOSPITAL_COMMUNITY): Payer: Self-pay

## 2021-09-06 ENCOUNTER — Ambulatory Visit (HOSPITAL_COMMUNITY)
Admission: EM | Admit: 2021-09-06 | Discharge: 2021-09-06 | Disposition: A | Discharge status: Home / Self Care | Payer: Medicaid Other | Source: Home / Self Care

## 2021-09-06 ENCOUNTER — Emergency Department (HOSPITAL_COMMUNITY)
Admission: EM | Admit: 2021-09-06 | Discharge: 2021-09-06 | Disposition: A | Discharge status: Home / Self Care | Payer: Medicaid Other | Attending: Emergency Medicine | Admitting: Emergency Medicine

## 2021-09-06 DIAGNOSIS — F200 Paranoid schizophrenia: Secondary | ICD-10-CM | POA: Insufficient documentation

## 2021-09-06 DIAGNOSIS — F1090 Alcohol use, unspecified, uncomplicated: Secondary | ICD-10-CM | POA: Insufficient documentation

## 2021-09-06 DIAGNOSIS — F32A Depression, unspecified: Secondary | ICD-10-CM | POA: Insufficient documentation

## 2021-09-06 DIAGNOSIS — R450 Nervousness: Secondary | ICD-10-CM | POA: Insufficient documentation

## 2021-09-06 DIAGNOSIS — Z5900 Homelessness unspecified: Secondary | ICD-10-CM | POA: Insufficient documentation

## 2021-09-06 DIAGNOSIS — R000 Tachycardia, unspecified: Secondary | ICD-10-CM | POA: Diagnosis not present

## 2021-09-06 DIAGNOSIS — Z046 Encounter for general psychiatric examination, requested by authority: Secondary | ICD-10-CM | POA: Insufficient documentation

## 2021-09-06 DIAGNOSIS — Z91148 Patient's other noncompliance with medication regimen for other reason: Secondary | ICD-10-CM | POA: Insufficient documentation

## 2021-09-06 DIAGNOSIS — E119 Type 2 diabetes mellitus without complications: Secondary | ICD-10-CM | POA: Insufficient documentation

## 2021-09-06 DIAGNOSIS — Z0489 Encounter for examination and observation for other specified reasons: Secondary | ICD-10-CM | POA: Insufficient documentation

## 2021-09-06 DIAGNOSIS — I10 Essential (primary) hypertension: Secondary | ICD-10-CM | POA: Insufficient documentation

## 2021-09-06 DIAGNOSIS — F909 Attention-deficit hyperactivity disorder, unspecified type: Secondary | ICD-10-CM

## 2021-09-06 DIAGNOSIS — Z7984 Long term (current) use of oral hypoglycemic drugs: Secondary | ICD-10-CM | POA: Diagnosis not present

## 2021-09-06 DIAGNOSIS — Z9151 Personal history of suicidal behavior: Secondary | ICD-10-CM | POA: Insufficient documentation

## 2021-09-06 DIAGNOSIS — Z79899 Other long term (current) drug therapy: Secondary | ICD-10-CM | POA: Insufficient documentation

## 2021-09-06 DIAGNOSIS — R4589 Other symptoms and signs involving emotional state: Secondary | ICD-10-CM

## 2021-09-06 MED ORDER — ARIPIPRAZOLE 10 MG PO TABS
30.0000 mg | ORAL_TABLET | Freq: Once | ORAL | Status: AC
  Administered 2021-09-06: 30 mg via ORAL
  Filled 2021-09-06: qty 3

## 2021-09-06 MED ORDER — AMLODIPINE BESYLATE 5 MG PO TABS
5.0000 mg | ORAL_TABLET | Freq: Once | ORAL | Status: AC
  Administered 2021-09-06: 5 mg via ORAL
  Filled 2021-09-06: qty 1

## 2021-09-06 NOTE — ED Notes (Signed)
Pt not back at bed. Pt eloped. Not IVC. MD notified  ?

## 2021-09-06 NOTE — ED Triage Notes (Addendum)
Reports wants to get mental health meds to start thinking right. Complains of bilateral feet pain and rash to left side of face.  Denies SI/HI  Needs strattera abilify and wellbutrin.  Patient has been wandering the lobby and triage and having to be redirected back to room. Patient seems a little disoriented reports he had prescriptions sent in but couldn't find them.  Continues to repeat he is not thinking straight.  ?

## 2021-09-06 NOTE — Discharge Instructions (Signed)
[facility] [address] [place] [facility] ?

## 2021-09-06 NOTE — ED Notes (Signed)
Patient's legal guardian per note on 04/24 contacted, no answer received and HIPPA compliant VM left ?

## 2021-09-06 NOTE — ED Notes (Signed)
Pt not on hall bed. RN looked around for pt, pt not in nearby bathrooms. Will wait to see if pt comes back to bed.  ?

## 2021-09-06 NOTE — ED Notes (Signed)
ED Provider at bedside. 

## 2021-09-06 NOTE — Discharge Instructions (Addendum)
F/u with [facility]  ?

## 2021-09-06 NOTE — ED Notes (Signed)
Patient given AVS. Patient states he has no where to go . Uncle called per request and uncle says he is in [place].  Tried to call the number on the card he came in with but no answer. Patient given a list of homeless shelters and is using the pay phone to try to call. Patient instructed if he came up with an address of a place to go, let us know and we will call [employer] to take him there since [employer] brought him in ?

## 2021-09-06 NOTE — ED Provider Notes (Signed)
?[facility] ?Provider Note ? ? ?CSN: [id number] ?Arrival date & time: 09/06/21  1244 ? ?  ? ?History ? ?Chief Complaint  ?Patient presents with  ? Psychiatric Evaluation  ? ? ?John Copeland is a 31 y.o. male. ? ?Patient is a 31 year old male with a history of diabetes, schizophrenia, homelessness, hypertension, depression and intermittent alcohol use who presents today reporting that he wants to get his medications and he is not sure where to find them.  Patient was seen in the emergency room 3 days ago and has paperwork that shows his prescription for his home medications [facility] [address]  He reports before he left the ER they had given him a dose of his nighttime meds and he was tired and could not find the pharmacy so never picked up the medication.  Today he reports he really just wants to get his medication.  He is also complaining that he has a rash on both sides of his head which he feels is related to irritation of the skin because he was coloring on his scalp with a marker last week.  It has been mildly tender and peeling.  He denies any SI or HI today.  Of note he was at behavioral health urgent care last night intoxicated was evaluated and discharged when he was found not to be a threat to himself or others.  Patient denies any active hallucinations and does not feel that he needs mental health evaluation at this time. ? ?The history is provided by the patient and medical records.  ? ?  ? ?Home Medications ?Prior to Admission medications   ?Medication Sig Start Date End Date Taking? Authorizing Provider  ?amLODipine (NORVASC) 5 MG tablet Take 1 tablet (5 mg total) by mouth daily. 09/03/21   Carmin Muskrat, MD  ?ARIPiprazole (ABILIFY) 30 MG tablet Take 1 tablet (30 mg total) by mouth daily. 09/03/21   Carmin Muskrat, MD  ?guanFACINE (INTUNIV) 1 MG TB24 ER tablet Take 1 tablet (1 mg total) by mouth at bedtime. 09/03/21   Carmin Muskrat, MD   ?metFORMIN (GLUCOPHAGE-XR) 500 MG 24 hr tablet Take 1 tablet (500 mg total) by mouth daily with breakfast. 09/03/21   Carmin Muskrat, MD  ?nicotine polacrilex (NICORETTE) 2 MG gum Take 1 each (2 mg total) by mouth every 4 (four) hours as needed for smoking cessation. 07/19/20   Salley Scarlet, MD  ?traZODone (DESYREL) 100 MG tablet Take 2 tablets (200 mg total) by mouth at bedtime. 09/03/21   Carmin Muskrat, MD  ?   ? ?Allergies    ?Risperdal [risperidone]   ? ?Review of Systems   ?Review of Systems ? ?Physical Exam ?Updated Vital Signs ?BP (!) 162/94 (BP Location: Right Arm)   Pulse (!) 101   Temp 99.6 ?F (37.6 ?C) (Oral)   Resp 16   Ht 6' (1.829 m)   Wt 88 kg   SpO2 99%   BMI 26.31 kg/m?  ?Physical Exam ?Vitals and nursing note reviewed.  ?Constitutional:   ?   General: He is not in acute distress. ?   Appearance: He is well-developed.  ?HENT:  ?   Head: Normocephalic and atraumatic.  ?Eyes:  ?   Conjunctiva/sclera: Conjunctivae normal.  ?   Pupils: Pupils are equal, round, and reactive to light.  ?Cardiovascular:  ?   Rate and Rhythm: Regular rhythm. Tachycardia present.  ?   Heart sounds: No murmur heard. ?Pulmonary:  ?  Effort: Pulmonary effort is normal. No respiratory distress.  ?   Breath sounds: Normal breath sounds. No wheezing or rales.  ?Abdominal:  ?   General: There is no distension.  ?   Palpations: Abdomen is soft.  ?   Tenderness: There is no abdominal tenderness. There is no guarding or rebound.  ?Musculoskeletal:     ?   General: No tenderness. Normal range of motion.  ?   Cervical back: Normal range of motion and neck supple.  ?Skin: ?   General: Skin is warm and dry.  ?   Findings: No erythema or rash.  ?   Comments: Dry, cracked, irritated skin present over both temples without any drainage or open lesions  ?Neurological:  ?   Mental Status: He is alert and oriented to person, place, and time. Mental status is at baseline.  ?Psychiatric:     ?   Attention and Perception: Attention  normal.     ?   Behavior: Behavior is hyperactive. Behavior is cooperative.     ?   Thought Content: Thought content does not include homicidal or suicidal ideation.  ? ? ?ED Results / Procedures / Treatments   ?Labs ?(all labs ordered are listed, but only abnormal results are displayed) ?Labs Reviewed - No data to display ? ?EKG ?None ? ?Radiology ?No results found. ? ?Procedures ?Procedures  ? ? ?Medications Ordered in ED ?Medications  ?amLODipine (NORVASC) tablet 5 mg (has no administration in time range)  ?ARIPiprazole (ABILIFY) tablet 30 mg (has no administration in time range)  ? ? ?ED Course/ Medical Decision Making/ A&P ?  ?                        ?Medical Decision Making ?Risk ?Prescription drug management. ? ? ?Patient is a 31 year old male with known mental health history who is presenting today requesting his medication.  He was seen on 09/03/2021 and at that time was [facility] but reports he could not find the pharmacy.  He is supposed to be on amlodipine, Abilify and Strattera.  Discussed with patient that I cannot prescribe the Strattera and that he will need to follow-up with mental health for that but we will give him a dose of his Abilify and amlodipine here.  The trazodone and other [facility] and will make sure patient has adequate directions on how to get there.  Patient at this time appears medically and psychiatrically cleared.  He is not suicidal homicidal or responding to internal stimuli at this time.  He makes good eye contact and although he is hyperactive otherwise appears appropriate at this time.  Once he gets a dose of his medication feel that he is stable for discharge.  As for the rash on the side of his face feel that it is localized irritation from him using a marker in the past.  Recommended he use a thick lotion such as CeraVe or Eucerin and it should improve with time. ? ? ? ? ? ? ? ?Final Clinical Impression(s) / ED  Diagnoses ?Final diagnoses:  ?None  ? ? ?Rx / DC Orders ?ED Discharge Orders   ? ? None  ? ?  ? ? ?  ?Blanchie Dessert, MD ?09/08/21 1044 ? ?

## 2021-09-06 NOTE — ED Notes (Signed)
HIPPA VM left at [facility] 24 hour line ?
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
## 2021-09-06 NOTE — ED Provider Notes (Signed)
Behavioral Health Urgent Care Medical Screening Exam ? ?Patient Name: John Copeland ?MRN: [id number] ?Date of Evaluation: 09/07/21 ?Chief Complaint:   ?Diagnosis:  ?Final diagnoses:  ?Homelessness unspecified  ?Anxious appearance  ?Hx of medication noncompliance  ? ? ?History of Present illness: John Copeland is a 31 y.o. male. Present to [facility] ,  per the patient he asked the police to bring him here,  because he need somewhere to rest.  Pt report he is homeless, he stated he now works at the [employer] and [employer]. Pt was just seen and d/c from the [facility] today.    ?Pt has a history of ETOH,  homelessness,  paranoia schizophrenia,  SI and medication noncompliance.  ? ?Observation of patient, he is alert and oriented x 4,  speech is clear,  mood calm,  affect congruent with mood.  Maintain good eye contact.  Pt denies SI,  HI,  AVH or paranoia.  Pt stated he need somewhere to stay. Pt current have active prescription at the pharmacy he needs to pick up so he can be compliant with his medication regimen. Pt stated I should have pick up the medicine but I didn't  have time.  NP discus with patient about going to the [facility],  pt  stated they put him on waiting list because they are full. At this time patient is not been influence by internal or external stimuli   ? ?Recommend discharge,  provide resource information to patient.  TTS did call  ? ?Psychiatric Specialty Exam ? ?Presentation  ?General Appearance:Casual ? ?Eye Contact:Good ? ?Speech:Clear and Coherent ? ?Speech Volume:Normal ? ?Handedness:Ambidextrous ? ? ?Mood and Affect  ?Mood:Anxious ? ?Affect:Appropriate ? ? ?Thought Process  ?Thought Processes:Coherent ? ?Descriptions of Associations:Circumstantial ? ?Orientation:Full (Time, Place and Person) ? ?Thought Content:Abstract Reasoning ? Diagnosis of Schizophrenia or Schizoaffective disorder in past: Yes ?  Hallucinations:None ? ?Ideas of Reference:None ? ?Suicidal  Thoughts:No ? ?Homicidal Thoughts:No ? ? ?Sensorium  ?Memory:Immediate Good ? ?Judgment:Fair ? ?Insight:Fair ? ? ?Executive Functions  ?Concentration:Fair ? ?Attention Span:Fair ? ?Recall:Fair ? ?Fund of Knowledge:Fair ? ?Language:Fair ? ? ?Psychomotor Activity  ?Psychomotor Activity:Normal ? ? ?Assets  ?Assets:Communication Skills; Desire for Improvement; Resilience; Social Support ? ? ?Sleep  ?Sleep:Fair ? ?Number of hours: No data recorded ? ?Nutritional Assessment (For OBS and FBC admissions only) ?Has the patient had a weight loss or gain of 10 pounds or more in the last 3 months?: No ?Has the patient had a decrease in food intake/or appetite?: No ?Does the patient have dental problems?: No ?Does the patient have eating habits or behaviors that may be indicators of an eating disorder including binging or inducing vomiting?: No ?Has the patient recently lost weight without trying?: 0 ?Has the patient been eating poorly because of a decreased appetite?: 0 ?Malnutrition Screening Tool Score: 0 ? ? ? ?Physical Exam: ?Physical Exam ?HENT:  ?   Head: Normocephalic.  ?   Nose: Nose normal.  ?Cardiovascular:  ?   Rate and Rhythm: Normal rate.  ?Pulmonary:  ?   Effort: Pulmonary effort is normal.  ?Musculoskeletal:     ?   General: Normal range of motion.  ?   Cervical back: Normal range of motion.  ?Skin: ?   General: Skin is warm.  ?Neurological:  ?   General: No focal deficit present.  ?   Mental Status: He is alert.  ?Psychiatric:     ?   Mood and Affect: Mood normal.  ? ?  Review of Systems  ?Constitutional: Negative.   ?HENT: Negative.    ?Eyes: Negative.   ?Respiratory: Negative.    ?Cardiovascular: Negative.   ?Gastrointestinal: Negative.   ?Genitourinary: Negative.   ?Musculoskeletal: Negative.   ?Skin: Negative.   ?Neurological: Negative.   ?Endo/Heme/Allergies: Negative.   ?Psychiatric/Behavioral:  Positive for depression. The patient is nervous/anxious.   ?Blood pressure (!) 144/91, pulse 87, temperature 98.5  ?F (36.9 ?C), temperature source Oral, resp. rate 18, SpO2 99 %. There is no height or weight on file to calculate BMI. ? ?Musculoskeletal: ?Strength & Muscle Tone: within normal limits ?Gait & Station: normal ?Patient leans: N/A ? ? ?[facility] Discharge Disposition for Follow up and Recommendations: ?Pt does not meet admission criteria,  pt current state he looking for a place to stay.  ? ? ?Sindy Guadeloupe, NP ?09/07/2021, 5:45 AM ? ?

## 2021-09-07 ENCOUNTER — Emergency Department (HOSPITAL_COMMUNITY)
Admission: EM | Admit: 2021-09-07 | Discharge: 2021-09-07 | Disposition: A | Discharge status: Home / Self Care | Payer: No Typology Code available for payment source | Source: Home / Self Care | Attending: Emergency Medicine | Admitting: Emergency Medicine

## 2021-09-07 ENCOUNTER — Emergency Department (HOSPITAL_COMMUNITY)
Admission: EM | Admit: 2021-09-07 | Discharge: 2021-09-07 | Disposition: A | Discharge status: Home / Self Care | Payer: No Typology Code available for payment source | Attending: Emergency Medicine | Admitting: Emergency Medicine

## 2021-09-07 ENCOUNTER — Other Ambulatory Visit: Payer: Self-pay

## 2021-09-07 ENCOUNTER — Ambulatory Visit (HOSPITAL_COMMUNITY)
Admission: AD | Admit: 2021-09-07 | Discharge: 2021-09-07 | Disposition: A | Discharge status: Home / Self Care | Payer: No Typology Code available for payment source | Attending: Psychiatry | Admitting: Psychiatry

## 2021-09-07 ENCOUNTER — Encounter (HOSPITAL_COMMUNITY): Payer: Self-pay

## 2021-09-07 ENCOUNTER — Encounter (HOSPITAL_COMMUNITY): Payer: Self-pay | Admitting: Emergency Medicine

## 2021-09-07 DIAGNOSIS — Z79899 Other long term (current) drug therapy: Secondary | ICD-10-CM | POA: Insufficient documentation

## 2021-09-07 DIAGNOSIS — M79672 Pain in left foot: Secondary | ICD-10-CM | POA: Insufficient documentation

## 2021-09-07 DIAGNOSIS — F209 Schizophrenia, unspecified: Secondary | ICD-10-CM | POA: Insufficient documentation

## 2021-09-07 DIAGNOSIS — Z7689 Persons encountering health services in other specified circumstances: Secondary | ICD-10-CM | POA: Insufficient documentation

## 2021-09-07 DIAGNOSIS — F1910 Other psychoactive substance abuse, uncomplicated: Secondary | ICD-10-CM | POA: Insufficient documentation

## 2021-09-07 DIAGNOSIS — Z7984 Long term (current) use of oral hypoglycemic drugs: Secondary | ICD-10-CM | POA: Insufficient documentation

## 2021-09-07 DIAGNOSIS — Z638 Other specified problems related to primary support group: Secondary | ICD-10-CM | POA: Insufficient documentation

## 2021-09-07 DIAGNOSIS — I10 Essential (primary) hypertension: Secondary | ICD-10-CM | POA: Insufficient documentation

## 2021-09-07 DIAGNOSIS — Z9151 Personal history of suicidal behavior: Secondary | ICD-10-CM | POA: Insufficient documentation

## 2021-09-07 DIAGNOSIS — Z5900 Homelessness unspecified: Secondary | ICD-10-CM | POA: Diagnosis present

## 2021-09-07 DIAGNOSIS — F32A Depression, unspecified: Secondary | ICD-10-CM | POA: Insufficient documentation

## 2021-09-07 DIAGNOSIS — F419 Anxiety disorder, unspecified: Secondary | ICD-10-CM | POA: Insufficient documentation

## 2021-09-07 DIAGNOSIS — F1090 Alcohol use, unspecified, uncomplicated: Secondary | ICD-10-CM | POA: Insufficient documentation

## 2021-09-07 DIAGNOSIS — E119 Type 2 diabetes mellitus without complications: Secondary | ICD-10-CM | POA: Insufficient documentation

## 2021-09-07 DIAGNOSIS — M79671 Pain in right foot: Secondary | ICD-10-CM | POA: Insufficient documentation

## 2021-09-07 MED ORDER — ACETAMINOPHEN 500 MG PO TABS
1000.0000 mg | ORAL_TABLET | Freq: Once | ORAL | Status: AC
  Administered 2021-09-07: 1000 mg via ORAL
  Filled 2021-09-07: qty 2

## 2021-09-07 NOTE — ED Provider Notes (Signed)
?[facility]-EMERGENCY DEPT ?Provider Note ? ? ?CSN: [id number] ?Arrival date & time: 09/07/21  0998 ? ?  ? ?History ? ?Chief Complaint  ?Patient presents with  ? Mental Health Problem  ? ? ?John Copeland is a 31 y.o. male. ? ?Patient here with anxiety but denies any suicidal homicidal ideation.  History of homelessness, schizophrenia, medication noncompliance.  Denies any chest pain or shortness of breath.  Would like some resources for behavioral health.  Denies any abdominal pain, nausea, vomiting. ? ? ?Mental Health Problem ? ?  ? ?Home Medications ?Prior to Admission medications   ?Medication Sig Start Date End Date Taking? Authorizing Provider  ?amLODipine (NORVASC) 5 MG tablet Take 1 tablet (5 mg total) by mouth daily. 09/03/21   Gerhard Munch, MD  ?ARIPiprazole (ABILIFY) 30 MG tablet Take 1 tablet (30 mg total) by mouth daily. 09/03/21   Gerhard Munch, MD  ?guanFACINE (INTUNIV) 1 MG TB24 ER tablet Take 1 tablet (1 mg total) by mouth at bedtime. 09/03/21   Gerhard Munch, MD  ?metFORMIN (GLUCOPHAGE-XR) 500 MG 24 hr tablet Take 1 tablet (500 mg total) by mouth daily with breakfast. 09/03/21   Gerhard Munch, MD  ?nicotine polacrilex (NICORETTE) 2 MG gum Take 1 each (2 mg total) by mouth every 4 (four) hours as needed for smoking cessation. 07/19/20   Jesse Sans, MD  ?traZODone (DESYREL) 100 MG tablet Take 2 tablets (200 mg total) by mouth at bedtime. 09/03/21   Gerhard Munch, MD  ?   ? ?Allergies    ?Risperdal [risperidone]   ? ?Review of Systems   ?Review of Systems ? ?Physical Exam ?Updated Vital Signs ?BP (!) 157/81 (BP Location: Left Arm)   Pulse 79   Temp 98.3 ?F (36.8 ?C) (Oral)   Resp 17   SpO2 99%  ?Physical Exam ?Vitals and nursing note reviewed.  ?Constitutional:   ?   General: He is not in acute distress. ?   Appearance: He is well-developed.  ?HENT:  ?   Head: Normocephalic and atraumatic.  ?Eyes:  ?   Conjunctiva/sclera: Conjunctivae normal.   ?Cardiovascular:  ?   Rate and Rhythm: Normal rate and regular rhythm.  ?   Heart sounds: No murmur heard. ?Pulmonary:  ?   Effort: Pulmonary effort is normal. No respiratory distress.  ?   Breath sounds: Normal breath sounds.  ?Abdominal:  ?   Palpations: Abdomen is soft.  ?   Tenderness: There is no abdominal tenderness.  ?Musculoskeletal:     ?   General: No swelling.  ?   Cervical back: Neck supple.  ?Skin: ?   General: Skin is warm and dry.  ?   Capillary Refill: Capillary refill takes less than 2 seconds.  ?Neurological:  ?   Mental Status: He is alert.  ?Psychiatric:     ?   Mood and Affect: Mood normal.     ?   Behavior: Behavior normal.     ?   Thought Content: Thought content normal.     ?   Judgment: Judgment normal.  ?   Comments: Slightly anxious but denies suicidal homicidal ideation.  Seems appropriate.  ? ? ?ED Results / Procedures / Treatments   ?Labs ?(all labs ordered are listed, but only abnormal results are displayed) ?Labs Reviewed - No data to display ? ?EKG ?None ? ?Radiology ?No results found. ? ?Procedures ?Procedures  ? ? ?Medications Ordered in ED ?Medications - No data to display ? ?ED Course/  Medical Decision Making/ A&P ?  ?                        ?Medical Decision Making ? ?John Copeland is here with anxiety, social issues.  Normal vitals.  No fever.  Denies suicidal homicidal ideation.  He is here ask about some mental health resources.  Patient was seen by mental health team yesterday.  Given resources.  Did not meet inpatient criteria.  He overall appears well.  He is mildly anxious but he seems to be doing well.  He does not appear to have any indication for acute hospitalization from a psychiatric standpoint.  He is not complaining of any physical complaints.  He had labs done several days ago that was unremarkable.  Overall suspect that this is a social visit.  Patient was given food and discharged in the ED in good condition.  Given resources for behavioral  health. ? ?This chart was dictated using voice recognition software.  Despite best efforts to proofread,  errors can occur which can change the documentation meaning.  ? ? ? ? ? ? ? ?Final Clinical Impression(s) / ED Diagnoses ?Final diagnoses:  ?Depression, unspecified depression type  ? ? ?Rx / DC Orders ?ED Discharge Orders   ? ? None  ? ?  ? ? ?  ?Virgina Norfolk, DO ?09/07/21 0719 ? ?

## 2021-09-07 NOTE — ED Triage Notes (Signed)
Pt reports bilateral foot pain.

## 2021-09-07 NOTE — ED Triage Notes (Signed)
Pt BIB EMS. Pt reports wanting psychiatric help. Pt denies SI/HI.  ?

## 2021-09-07 NOTE — ED Notes (Addendum)
Pt informed me "I got my tylenol, I'm good now, I got places to go." Pt left emergency department. Pt refused d/c vital signs. ?

## 2021-09-07 NOTE — ED Provider Notes (Signed)
?[facility]-EMERGENCY DEPT ?Provider Note ? ? ?CSN: [id number] ?Arrival date & time: 09/07/21  1622 ? ?  ? ?History ? ?Chief Complaint  ?Patient presents with  ? Foot Pain  ? ? ?John Copeland is a 31 y.o. male. ? ?The history is provided by the patient and medical records. No language interpreter was used.  ?Foot Pain ? ? ?31 year old male with significant history of diabetes, schizophrenia, homelessness, hypertension, presenting complaining of foot pain.  Patient reported he developed pain to both feet a few hours ago.  Pain is achy.  No fever.  No injury.  No other complaint.  Patient requesting for orthopedic shoe.  He has had similar pain like this in the past ? ?Home Medications ?Prior to Admission medications   ?Medication Sig Start Date End Date Taking? Authorizing Provider  ?amLODipine (NORVASC) 5 MG tablet Take 1 tablet (5 mg total) by mouth daily. 09/03/21   Gerhard Munch, MD  ?ARIPiprazole (ABILIFY) 30 MG tablet Take 1 tablet (30 mg total) by mouth daily. 09/03/21   Gerhard Munch, MD  ?guanFACINE (INTUNIV) 1 MG TB24 ER tablet Take 1 tablet (1 mg total) by mouth at bedtime. 09/03/21   Gerhard Munch, MD  ?metFORMIN (GLUCOPHAGE-XR) 500 MG 24 hr tablet Take 1 tablet (500 mg total) by mouth daily with breakfast. 09/03/21   Gerhard Munch, MD  ?nicotine polacrilex (NICORETTE) 2 MG gum Take 1 each (2 mg total) by mouth every 4 (four) hours as needed for smoking cessation. 07/19/20   Jesse Sans, MD  ?traZODone (DESYREL) 100 MG tablet Take 2 tablets (200 mg total) by mouth at bedtime. 09/03/21   Gerhard Munch, MD  ?   ? ?Allergies    ?Risperdal [risperidone]   ? ?Review of Systems   ?Review of Systems  ?All other systems reviewed and are negative. ? ?Physical Exam ?Updated Vital Signs ?BP (!) 151/93 (BP Location: Right Arm)   Pulse 84   Temp 98.2 ?F (36.8 ?C) (Oral)   Resp 18   SpO2 99%  ?Physical Exam ?Vitals and nursing note reviewed.  ?Constitutional:   ?   General: He  is not in acute distress. ?   Appearance: He is well-developed.  ?HENT:  ?   Head: Atraumatic.  ?Eyes:  ?   Conjunctiva/sclera: Conjunctivae normal.  ?Musculoskeletal:  ?   Cervical back: Neck supple.  ?   Comments: Normal-appearing feet without reproducible tenderness.  Ambulate without difficulty  ?Skin: ?   Findings: No rash.  ?Neurological:  ?   Mental Status: He is alert.  ? ? ?ED Results / Procedures / Treatments   ?Labs ?(all labs ordered are listed, but only abnormal results are displayed) ?Labs Reviewed - No data to display ? ?EKG ?None ? ?Radiology ?No results found. ? ?Procedures ?Procedures  ? ? ?Medications Ordered in ED ?Medications - No data to display ? ?ED Course/ Medical Decision Making/ A&P ?  ?                        ?Medical Decision Making ? ?BP (!) 151/93 (BP Location: Right Arm)   Pulse 84   Temp 98.2 ?F (36.8 ?C) (Oral)   Resp 18   SpO2 99%  ? ?5:44 PM ?Patient complaining of bilateral feet pain for the past few hours.  No injury.  No signs of infection.  Patient is homeless, requesting for orthopedic shoes.  Patient has been seen in the ED multiple times in  the past.  I have reviewed patient's prior chart and considered in my plan of care.  At this time, I will provide Tylenol as needed for pain but otherwise patient is stable for discharge. ? ? ? ? ? ? ? ?Final Clinical Impression(s) / ED Diagnoses ?Final diagnoses:  ?Foot pain, right  ?Foot pain, left  ? ? ?Rx / DC Orders ?ED Discharge Orders   ? ? None  ? ?  ? ? ?  ?Fayrene Helper, PA-C ?09/07/21 1748 ? ?  ?Mancel Bale, MD ?09/07/21 2303 ? ?

## 2021-09-08 ENCOUNTER — Emergency Department (HOSPITAL_COMMUNITY)
Admission: EM | Admit: 2021-09-08 | Discharge: 2021-09-08 | Disposition: A | Discharge status: Home / Self Care | Payer: Medicaid Other | Attending: Emergency Medicine | Admitting: Emergency Medicine

## 2021-09-08 ENCOUNTER — Encounter (HOSPITAL_COMMUNITY): Payer: Self-pay | Admitting: Emergency Medicine

## 2021-09-08 ENCOUNTER — Other Ambulatory Visit: Payer: Self-pay

## 2021-09-08 ENCOUNTER — Ambulatory Visit (HOSPITAL_COMMUNITY)
Admission: EM | Admit: 2021-09-08 | Discharge: 2021-09-08 | Disposition: A | Discharge status: Home / Self Care | Payer: No Typology Code available for payment source | Attending: Psychiatry | Admitting: Psychiatry

## 2021-09-08 DIAGNOSIS — R21 Rash and other nonspecific skin eruption: Secondary | ICD-10-CM | POA: Diagnosis present

## 2021-09-08 DIAGNOSIS — Z760 Encounter for issue of repeat prescription: Secondary | ICD-10-CM | POA: Diagnosis not present

## 2021-09-08 DIAGNOSIS — X398XXA Other exposure to forces of nature, initial encounter: Secondary | ICD-10-CM

## 2021-09-08 DIAGNOSIS — Z5900 Homelessness unspecified: Secondary | ICD-10-CM | POA: Insufficient documentation

## 2021-09-08 DIAGNOSIS — Z91148 Patient's other noncompliance with medication regimen for other reason: Secondary | ICD-10-CM | POA: Insufficient documentation

## 2021-09-08 DIAGNOSIS — X31XXXA Exposure to excessive natural cold, initial encounter: Secondary | ICD-10-CM | POA: Diagnosis not present

## 2021-09-08 MED ORDER — ARIPIPRAZOLE 30 MG PO TABS: 30.0000 mg | ORAL_TABLET | Freq: Every day | ORAL | 0 refills | Status: DC

## 2021-09-08 MED ORDER — METFORMIN HCL ER 500 MG PO TB24: 500.0000 mg | ORAL_TABLET | Freq: Every day | ORAL | 0 refills | Status: DC

## 2021-09-08 MED ORDER — METFORMIN HCL ER 500 MG PO TB24
500.0000 mg | ORAL_TABLET | Freq: Every day | ORAL | Status: DC
  Administered 2021-09-08: 500 mg via ORAL
  Filled 2021-09-08: qty 1

## 2021-09-08 MED ORDER — ARIPIPRAZOLE 10 MG PO TABS
30.0000 mg | ORAL_TABLET | Freq: Once | ORAL | Status: AC
  Administered 2021-09-08: 30 mg via ORAL
  Filled 2021-09-08: qty 3

## 2021-09-08 MED ORDER — NICOTINE POLACRILEX 2 MG MT GUM: 2.0000 mg | CHEWING_GUM | OROMUCOSAL | 0 refills | Status: DC | PRN

## 2021-09-08 MED ORDER — TRAZODONE HCL 100 MG PO TABS: 200.0000 mg | ORAL_TABLET | Freq: Every day | ORAL | 0 refills | Status: DC

## 2021-09-08 MED ORDER — GUANFACINE HCL ER 1 MG PO TB24: 1.0000 mg | ORAL_TABLET | Freq: Every day | ORAL | 0 refills | Status: DC

## 2021-09-08 MED ORDER — AMLODIPINE BESYLATE 5 MG PO TABS: 5.0000 mg | ORAL_TABLET | Freq: Every day | ORAL | 0 refills | Status: DC

## 2021-09-08 MED ORDER — AMLODIPINE BESYLATE 5 MG PO TABS
5.0000 mg | ORAL_TABLET | Freq: Every day | ORAL | Status: DC
  Administered 2021-09-08: 5 mg via ORAL
  Filled 2021-09-08: qty 1
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
## 2021-09-08 NOTE — ED Notes (Signed)
Pt discharged with  AVS.  AVS reviewed prior to discharge.  Pt alert, oriented, and ambulatory.  SW attempted to reach out to guardian but was unsuccessful.  Per Sw message was left.  Safety maintained.  ?
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
## 2021-09-08 NOTE — ED Provider Notes (Signed)
Behavioral Health Urgent Care Medical Screening Exam ? ?Patient Name: John Copeland ?MRN: [id number] ?Date of Evaluation: 09/08/21 ?Chief Complaint:   ?Diagnosis:  ?Final diagnoses:  ?Homelessness  ?Noncompliance with medication regimen  ? ? ?History of Present illness: John Copeland is a 31 y.o. male. Present to [facility] after discharge from ED, pt is homeless at has been going from one ED to the next,  anywhere he can be seen with possibility of staying.  ? ?Observation of patient,  he is alert and oriented x 4, speech is clear,  mood relax affect congruent with mood.  Pt denies SI, AVH, HI or paranoia.  When asked by the NP what brought him in tonight pt stated he just want to stay her until gets to go the the rehab place around the corner. When asked what is the name of the rehab patient stated you know.   When patient was seen in [facility] tonight he told TTS that the bus stop running and it raining outside.   ? ?Recommend, discharge,  patient to f/u with outpatient therapy and medication management.  ? ?Psychiatric Specialty Exam ? ?Presentation  ?General Appearance:Appropriate for Environment; Neat ? ?Eye Contact:Fair ? ?Speech:Clear and Coherent; Normal Rate ? ?Speech Volume:Normal ? ?Handedness:Ambidextrous ? ? ?Mood and Affect  ?Mood:Anxious ? ?Affect:Flat ? ? ?Thought Process  ?Thought Processes:Coherent; Linear ? ?Descriptions of Associations:Intact ? ?Orientation:Full (Time, Place and Person) ? ?Thought Content:Logical ? Diagnosis of Schizophrenia or Schizoaffective disorder in past: Yes ?  Hallucinations:None ? ?Ideas of Reference:None ? ?Suicidal Thoughts:No ? ?Homicidal Thoughts:No ? ? ?Sensorium  ?Memory:Immediate Good; Recent Good ? ?Judgment:Fair ? ?Insight:Fair ? ? ?Executive Functions  ?Concentration:Fair ? ?Attention Span:Fair ? ?Recall:Good ? ?Fund of Knowledge:Fair ? ?Language:Good ? ? ?Psychomotor Activity  ?Psychomotor Activity:Normal ? ? ?Assets  ?Assets:Communication Skills; Desire for  Improvement; Physical Health ? ? ?Sleep  ?Sleep:Fair ? ?Number of hours: No data recorded ? ?Nutritional Assessment (For OBS and FBC admissions only) ?Has the patient had a weight loss or gain of 10 pounds or more in the last 3 months?: No ?Has the patient had a decrease in food intake/or appetite?: No ?Does the patient have dental problems?: No ?Does the patient have eating habits or behaviors that may be indicators of an eating disorder including binging or inducing vomiting?: No ?Has the patient recently lost weight without trying?: 0 ?Has the patient been eating poorly because of a decreased appetite?: 0 ?Malnutrition Screening Tool Score: 0 ? ? ? ?Physical Exam: ?Physical Exam ?HENT:  ?   Head: Normocephalic.  ?   Nose: Nose normal.  ?Cardiovascular:  ?   Rate and Rhythm: Normal rate.  ?Pulmonary:  ?   Effort: Pulmonary effort is normal.  ?Musculoskeletal:     ?   General: Normal range of motion.  ?   Cervical back: Normal range of motion.  ?Skin: ?   General: Skin is warm.  ?Neurological:  ?   General: No focal deficit present.  ?   Mental Status: He is alert.  ?Psychiatric:     ?   Mood and Affect: Mood normal.     ?   Behavior: Behavior normal.     ?   Thought Content: Thought content normal.     ?   Judgment: Judgment normal.  ? ?Review of Systems  ?Constitutional: Negative.   ?HENT: Negative.    ?Eyes: Negative.   ?Respiratory: Negative.    ?Cardiovascular: Negative.   ?Gastrointestinal: Negative.   ?Genitourinary: Negative.   ?  Musculoskeletal: Negative.   ?Skin: Negative.   ?Neurological: Negative.   ?Endo/Heme/Allergies: Negative.   ?Psychiatric/Behavioral:  Positive for depression. The patient is nervous/anxious.   ?Blood pressure (!) 159/87, pulse 66, temperature 97.9 ?F (36.6 ?C), temperature source Oral, resp. rate 20, SpO2 100 %. There is no height or weight on file to calculate BMI. ? ?Musculoskeletal: ?Strength & Muscle Tone: within normal limits ?Gait & Station: normal ?Patient leans:  N/A ? ? ?[facility] MSE Discharge Disposition for Follow up and Recommendations: ?Based on my evaluation the patient does not appear to have an emergency medical condition and can be discharged with resources and follow up care in outpatient services for Individual Therapy ? ? ?Sindy Guadeloupe, NP ?09/08/2021, 2:59 AM ? ?

## 2021-09-08 NOTE — ED Triage Notes (Addendum)
Pt BIB EMS for "not feeling well" and "possible pneumonia", when asked pt what brings him in he states "I'm really, really, really cold and don't feel well" does not verbalize any additional s/s; pt reports he has not had home meds in awhile and would like paper RX's while here so he can have them filled at a pharmacy he is close to as he is homeless  ?

## 2021-09-08 NOTE — Discharge Instructions (Signed)
Return if any problems.

## 2021-09-08 NOTE — H&P (Signed)
Behavioral Health Medical Screening Exam ? ?John Copeland is a 31 y.o. male. ? ?Pt is a 31 year old single male who presents unaccompanied to [facility] stating he wants to "be stabilized on my medication." Pt also says it is 11:30 at night, cold, raining, and he has no shelter. He says he has been spending the night recently in local EDs. Pt's medical record indicates he was at [facility] ED twice today and [facility] and [facility] ED yesterday. He says his legal guardian is assisting him with finding a boarding house or some other place to stay. Pt acknowledges he has not followed through on contacting homeless shelters, adding "I don't always make good decisions." He says he wants to be a priest and need to stop using alcohol and abusing cough medications. He says he has not used any substances in two days. He denies current suicidal ideation. He says he attempted suicide many years ago by overdosing. He denies current homicidal ideation. He denies auditory or visual hallucinations.  ?  ?Pt's legal guardian is [name], legal guardian with [facility], [phone]. TTS attempted to contact her but call went to voicemail. Pt says he has family and friends but no one is currently supportive. He says he is on disability but does not know who his payee is. He denies current legal problems. He says he was petitioned for IVC approximately 10 days ago and was in a hospital in [place], [place] for two days before being discharged and transported back to [facility]. ? ?On evaluation patient is alert and oriented x 4, pleasant, and cooperative. Speech is clear and coherent. Mood is anxious. Affect is flat. Thought process is coherent and thought content is logical. Denies auditory and visual hallucinations. No indication that patient is responding to internal stimuli. No evidence of delusional thought content. Denies suicidal ideations. Denies homicidal ideations.  ?Total Time spent with patient: 20  minutes ? ?Psychiatric Specialty Exam: ? ?Presentation  ?General Appearance: Appropriate for Environment; Neat ? ?Eye Contact:Fair ? ?Speech:Clear and Coherent; Normal Rate ? ?Speech Volume:Normal ? ?Handedness:Ambidextrous ? ? ?Mood and Affect  ?Mood:Anxious ? ?Affect:Flat ? ? ?Thought Process  ?Thought Processes:Coherent; Linear ? ?Descriptions of Associations:Intact ? ?Orientation:Full (Time, Place and Person) ? ?Thought Content:Logical ? ?History of Schizophrenia/Schizoaffective disorder:Yes ? ?Duration of Psychotic Symptoms:No data recorded ?Hallucinations:Hallucinations: None ? ?Ideas of Reference:None ? ?Suicidal Thoughts:Suicidal Thoughts: No ? ?Homicidal Thoughts:Homicidal Thoughts: No ? ? ?Sensorium  ?Memory:Immediate Good; Recent Good ? ?Judgment:Fair ? ?Insight:Fair ? ? ?Executive Functions  ?Concentration:Fair ? ?Attention Span:Fair ? ?Recall:Good ? ?Fund of Knowledge:Fair ? ?Language:Good ? ? ?Psychomotor Activity  ?Psychomotor Activity:Psychomotor Activity: Normal ? ? ?Assets  ?Assets:Communication Skills; Desire for Improvement; Physical Health ? ? ?Sleep  ?Sleep:Sleep: Fair ? ? ? ?Physical Exam: ?Physical Exam ?Constitutional:   ?   General: He is not in acute distress. ?   Appearance: He is not ill-appearing, toxic-appearing or diaphoretic.  ?HENT:  ?   Right Ear: External ear normal.  ?   Left Ear: External ear normal.  ?Eyes:  ?   Pupils: Pupils are equal, round, and reactive to light.  ?Cardiovascular:  ?   Rate and Rhythm: Normal rate.  ?Pulmonary:  ?   Effort: Pulmonary effort is normal. No respiratory distress.  ?Musculoskeletal:     ?   General: Normal range of motion.  ?Neurological:  ?   Mental Status: He is alert and oriented to person, place, and time.  ?Psychiatric:     ?  Speech: Speech normal.     ?   Thought Content: Thought content is not paranoid or delusional. Thought content does not include homicidal or suicidal ideation.  ? ?Review of Systems  ?Constitutional:  Negative for  chills, diaphoresis, fever, malaise/fatigue and weight loss.  ?Cardiovascular:  Negative for chest pain and palpitations.  ?Gastrointestinal:  Negative for diarrhea, nausea and vomiting.  ?Neurological:  Negative for dizziness and seizures.  ?Psychiatric/Behavioral:  Negative for depression, hallucinations, memory loss and suicidal ideas. The patient is nervous/anxious and has insomnia.   ? ?Blood pressure (!) 148/76, pulse 74, temperature 98.2 ?F (36.8 ?C), resp. rate 18, SpO2 100 %. There is no height or weight on file to calculate BMI. ? ?Musculoskeletal: ?Strength & Muscle Tone: within normal limits ?Gait & Station: normal ?Patient leans: N/A ? ? ?Recommendations: ? ?Based on my evaluation the patient does not appear to have an emergency medical condition. ? ?Disposition: No evidence of imminent risk to self or others at present.   ?Patient does not meet criteria for psychiatric inpatient admission. ?Supportive therapy provided about ongoing stressors. ?Discussed crisis plan, support from social network, calling 911, coming to the Emergency Department, and calling Suicide Hotline.  ? ?Jackelyn Poling, NP ?09/08/2021, 2:43 AM ? ?

## 2021-09-08 NOTE — Progress Notes (Signed)
TRIAGE: ROUTINE ?Evette Georges, NP, reviewed pt's chart and information and met with pt face-to-face and determined pt can be psych cleared. ? ? 09/08/21 0138  ?[place] Triage Screening (Walk-ins at [facility] only)  ?How Did You Hear About [place]? Self  ?What Is the Reason for Your Visit/Call Today? Pt states, "I'm trying to get some substance abuse inpatient treatment." Pt denies SI or a plan to kill himself. He denies HI, AVH, NSSIB, access to guns, and engagement with the legal system. Pt shares he took 16 cold tablets throughout the day yesterday and that he drank a 12-ounce beer and hand sanitizer either Monday or Tuesday. Pt denies any w/d symptoms at this time.  ?How Long Has This Been Causing You Problems? 1 wk - 1 month  ?Have You Recently Had Any Thoughts About Hurting Yourself? No  ?Are You Planning to Commit Suicide/Harm Yourself At This time? No  ?Have you Recently Had Thoughts About [place]? No  ?Are You Planning To Harm Someone At This Time? No  ?Are you currently experiencing any auditory, visual or other hallucinations? No  ?Have You Used Any Alcohol or Drugs in the Past 24 Hours? No  ?How long ago did you use Drugs or Alcohol? Either Monday or Tuesday; though, per chart, pt told clinician on 09/06/21 that he no longer uses drugs.  ?What Did You Use and How Much? N/A  ?Do you have any current medical co-morbidities that require immediate attention? No  ?Clinician description of patient physical appearance/behavior: Pt is dressed in all black clothing and dress shoes. Pt has arranged his shirt collar to look as if he has a white square in front of his throat, like that of a priest. Pt states, "I'm trying to be a priest; I need to stop doing this stuff."  ?What Do You Feel Would Help You the Most Today? Alcohol or Drug Use Treatment  ?If access to [facility] was not available, would you have sought care in the Emergency Department? Yes  ?Determination of Need Routine (7 days)  ?Options For Referral  Medication Management;Outpatient Therapy  ? ? ?

## 2021-09-08 NOTE — Discharge Instructions (Addendum)
F/u with out patient [facility] walk-in psychiatry  ?

## 2021-09-08 NOTE — BH Assessment (Signed)
Clinician left a HIPAA-compliant voicemail message requesting a call-back for pt's guardian. ? ?[name], [place] [facility] [employer]: [phone] ?

## 2021-09-13 ENCOUNTER — Ambulatory Visit (HOSPITAL_COMMUNITY)
Admission: EM | Admit: 2021-09-13 | Discharge: 2021-09-13 | Disposition: A | Discharge status: Home / Self Care | Payer: No Typology Code available for payment source | Attending: Psychiatry | Admitting: Psychiatry

## 2021-09-13 DIAGNOSIS — Z9151 Personal history of suicidal behavior: Secondary | ICD-10-CM | POA: Insufficient documentation

## 2021-09-13 DIAGNOSIS — F909 Attention-deficit hyperactivity disorder, unspecified type: Secondary | ICD-10-CM | POA: Insufficient documentation

## 2021-09-13 DIAGNOSIS — Z760 Encounter for issue of repeat prescription: Secondary | ICD-10-CM | POA: Insufficient documentation

## 2021-09-13 DIAGNOSIS — Z720 Tobacco use: Secondary | ICD-10-CM | POA: Insufficient documentation

## 2021-09-13 DIAGNOSIS — Z79899 Other long term (current) drug therapy: Secondary | ICD-10-CM | POA: Insufficient documentation

## 2021-09-13 NOTE — Discharge Instructions (Signed)
?  Please come to [facility] (this facility) during walk in hours for appointment with psychiatrist/provider for further medication management and for therapists for therapy.  ? ? Walk-Ins for medication management  are available on Monday, Wednesday, Thursday and Friday from 8am-11am.  It is first come, first -serve; it is best to arrive by 7:00 AM.  ? ? Walk-Ins for therapy are  available on Monday and Wednesday?s  8am-11am.  It is first come, first -serve; it is best to arrive by 7:00 AM.  ? ? ?When you arrive please go upstairs for your appointment. If you are unsure of where to go, inform the front desk that you are here for a walk in appointment and they will assist you with directions upstairs. ? ?Address:  ?[address], in [place], [address] ?Ph: [phone]  ? ? ? ? ? ?

## 2021-09-13 NOTE — ED Provider Notes (Signed)
Behavioral Health Urgent Care Medical Screening Exam ? ?Patient Name: John Copeland ?MRN: [id number] ?Date of Evaluation: 09/13/21 ?Chief Complaint:   ?Diagnosis:  ?Final diagnoses:  ?Medication management  ? ? ?History of Present illness: John Copeland is a 31 y.o. male. He reports arriving today to get refills of his medications.  He reports that he specifically would like his Strattera refilled as this helps with his ADHD which he reports is very disruptive to him.  He reports that he has been given a job at [employer] but that he is having trouble filling out his W-2 due to his ADHD.  He reports that he was in the ED last week and got his medications at that time.  ? ?He reports a PPHx of Schizoaffective Disorder, ADHD, 2 Suicide Attempts (last 9 yrs ago OD), and multiple hospitalizations (latest [place] 4/23).  He reports a family history of Substance use in his mother and father and an unknown psychiatric issue in his mother.  He reports no known history of Suicide. ? ?He reports he currently lives with friends.  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  He reports drinking EtOH, when asked how much he reports drinking 3 beers 2 days ago, when asked he confirms a history of EtOH abuse.  He reports occasional cigarette and cigar use.  He reports occasional THC use. ? ?He reports no symptoms of depression, Mania, Psychosis, or PTSD.  He reports no SI, HI, or AVH.  Discussed [place] walk in hours of Monday, Wednesday-Friday.  Discussed hours are 8-11, encouraged him to arrive prior to 7:30.  He reports he plans to do this.  He reports being a little tired but otherwise has no other concerns at present.  He was provided with a bus pass and discharged.  Attempted to call patients guardian John Copeland, [phone], but there was no answer. ? ? ?Psychiatric Specialty Exam ? ?Presentation  ?General  Appearance:Appropriate for Environment; Neat (in a suit) ? ?Eye Contact:Good ? ?Speech:Clear and Coherent; Normal Rate ? ?Speech Volume:Normal ? ?Handedness:Ambidextrous ? ? ?Mood and Affect  ?Mood:Euthymic ? ?Affect:Congruent; Appropriate ? ? ?Thought Process  ?Thought Processes:Coherent; Goal Directed ? ?Descriptions of Associations:Intact ? ?Orientation:Full (Time, Place and Person) ? ?Thought Content:Logical ?No SI, HI, or AVH. ? ?Diagnosis of Schizophrenia or Schizoaffective disorder in past: Yes ?  Hallucinations:None ? ?Ideas of Reference:None ? ?Suicidal Thoughts:No ? ?Homicidal Thoughts:No ? ? ?Sensorium  ?Memory:Immediate Good; Recent Good ? ?Judgment:Fair ? ?Insight:Fair ? ? ?Executive Functions  ?Concentration:Good ? ?Attention Span:Good ? ?Recall:Good ? ?Fund of [place] ? ?Language:Good ? ? ?Psychomotor Activity  ?Psychomotor Activity:Normal ? ? ?Assets  ?Assets:Communication Skills; Desire for Improvement; Physical Health ? ? ?Sleep  ?Sleep:Fair ? ? ? ?Physical Exam: ?Physical Exam ?Vitals and nursing note reviewed.  ?Constitutional:   ?   General: He is not in acute distress. ?   Appearance: Normal appearance. He is normal weight. He is not ill-appearing or toxic-appearing.  ?HENT:  ?   Head: Normocephalic and atraumatic.  ?Pulmonary:  ?   Effort: Pulmonary effort is normal.  ?Musculoskeletal:     ?   General: Normal range of motion.  ?Neurological:  ?   General: No focal deficit present.  ?   Mental Status: He is alert.  ? ?Review of Systems  ?Respiratory:  Negative for cough and shortness of breath.   ?Cardiovascular:  Negative for chest  pain.  ?Gastrointestinal:  Negative for abdominal pain, constipation, diarrhea, nausea and vomiting.  ?Neurological:  Negative for dizziness, weakness and headaches.  ?Psychiatric/Behavioral:  Negative for depression, hallucinations and suicidal ideas. The patient is not nervous/anxious.   ?Blood pressure 132/80, pulse 73, temperature 98.7 ?F (37.1 ?C),  temperature source Oral, resp. rate 18, SpO2 100 %. There is no height or weight on file to calculate BMI. ? ?Musculoskeletal: ?Strength & Muscle Tone: within normal limits ?Gait & Station: normal ?Patient leans: N/A ? ? ?[facility] MSE Discharge Disposition for Follow up and Recommendations: ?Based on my evaluation the patient does not appear to have an emergency medical condition and can be discharged with resources and follow up care in outpatient services for Medication Management ? ?Discussed walk in hours of [place] Monday, Wednesday-Friday and encouraged him to arrive no later than 7:30 to establish care. ? ?John Cedar, MD ?09/13/2021, 2:29 PM ? ?

## 2021-09-13 NOTE — Progress Notes (Signed)
Patient presents to the [facility] today voluntarily with the police.  Patient was just seen five days ago here and was requesting SA treatment and states that he wanted SA treatment.  At that time he was abusing cold tablets.  No mention of SA use today and does not appear to be impaired.  Patient states that he was just released from [place] a few months ago and he was prescribed Abilify, Wellbutrin and Straterra.  States that he has been off his meds for the past week.  Patient states that his sleep, appetite are good.  He denies SI/HI/Psychosis.  Patient states that he is planning to start school and states that he needs his medication in order to focus.  Patient states that he is currently staying with friends and states that he can return there.  He is dressed in a suit, but states that he just came from a job interview. Patient is routine. ?

## 2021-09-15 ENCOUNTER — Other Ambulatory Visit: Payer: Self-pay

## 2021-09-15 ENCOUNTER — Emergency Department (HOSPITAL_COMMUNITY)
Admission: EM | Admit: 2021-09-15 | Discharge: 2021-09-16 | Disposition: A | Discharge status: Other Acute Inpatient Hospital | Payer: Medicaid Other | Attending: Emergency Medicine | Admitting: Emergency Medicine

## 2021-09-15 ENCOUNTER — Encounter (HOSPITAL_COMMUNITY): Payer: Self-pay

## 2021-09-15 DIAGNOSIS — Z9151 Personal history of suicidal behavior: Secondary | ICD-10-CM | POA: Insufficient documentation

## 2021-09-15 DIAGNOSIS — F29 Unspecified psychosis not due to a substance or known physiological condition: Secondary | ICD-10-CM | POA: Insufficient documentation

## 2021-09-15 DIAGNOSIS — T483X2A Poisoning by antitussives, intentional self-harm, initial encounter: Secondary | ICD-10-CM | POA: Diagnosis not present

## 2021-09-15 DIAGNOSIS — Z560 Unemployment, unspecified: Secondary | ICD-10-CM | POA: Insufficient documentation

## 2021-09-15 DIAGNOSIS — X838XXA Intentional self-harm by other specified means, initial encounter: Secondary | ICD-10-CM | POA: Insufficient documentation

## 2021-09-15 DIAGNOSIS — F259 Schizoaffective disorder, unspecified: Secondary | ICD-10-CM | POA: Diagnosis not present

## 2021-09-15 DIAGNOSIS — I10 Essential (primary) hypertension: Secondary | ICD-10-CM | POA: Insufficient documentation

## 2021-09-15 DIAGNOSIS — T485X2A Poisoning by other anti-common-cold drugs, intentional self-harm, initial encounter: Secondary | ICD-10-CM | POA: Insufficient documentation

## 2021-09-15 DIAGNOSIS — Z720 Tobacco use: Secondary | ICD-10-CM | POA: Insufficient documentation

## 2021-09-15 DIAGNOSIS — T50902A Poisoning by unspecified drugs, medicaments and biological substances, intentional self-harm, initial encounter: Secondary | ICD-10-CM | POA: Diagnosis present

## 2021-09-15 DIAGNOSIS — F909 Attention-deficit hyperactivity disorder, unspecified type: Secondary | ICD-10-CM | POA: Diagnosis not present

## 2021-09-15 DIAGNOSIS — F600 Paranoid personality disorder: Secondary | ICD-10-CM | POA: Diagnosis not present

## 2021-09-15 DIAGNOSIS — Z5900 Homelessness unspecified: Secondary | ICD-10-CM | POA: Insufficient documentation

## 2021-09-15 DIAGNOSIS — E119 Type 2 diabetes mellitus without complications: Secondary | ICD-10-CM | POA: Insufficient documentation

## 2021-09-15 DIAGNOSIS — Z79899 Other long term (current) drug therapy: Secondary | ICD-10-CM | POA: Insufficient documentation

## 2021-09-15 DIAGNOSIS — R45851 Suicidal ideations: Secondary | ICD-10-CM | POA: Diagnosis not present

## 2021-09-15 DIAGNOSIS — Z7984 Long term (current) use of oral hypoglycemic drugs: Secondary | ICD-10-CM | POA: Insufficient documentation

## 2021-09-15 DIAGNOSIS — Z20822 Contact with and (suspected) exposure to covid-19: Secondary | ICD-10-CM | POA: Diagnosis not present

## 2021-09-15 NOTE — ED Triage Notes (Addendum)
BIB EMS from [place] for SI states he took 600mg  of Dextromethorphan in a suicide attempt, no other complaints, CBG 89 ?

## 2021-09-15 NOTE — ED Provider Notes (Signed)
?[facility]-EMERGENCY DEPT ?Provider Note ? ? ?CSN: [id number] ?Arrival date & time: 09/15/21  2336 ? ?  ? ?History ? ?Chief Complaint  ?Patient presents with  ? Suicidal  ? ? ?John Copeland is a 31 y.o. male. ? ?The history is provided by the patient and medical records.  ? ?31 y.o. M with hx of alcohol abuse, dyslipidemia, diabetes, schizophrenia, hypertension, presenting to the ED after intentional overdose.  States he took a total of 600 mg of dextromethorphan tablets throughout the day today in an attempt to end his life.  He states "I have nothing to live for anyway".  He denies any other coingestions.  Denies any illicit drug use or alcohol use today.  Denies any HI or AVH. ? ?Home Medications ?Prior to Admission medications   ?Medication Sig Start Date End Date Taking? Authorizing Provider  ?amLODipine (NORVASC) 5 MG tablet Take 1 tablet (5 mg total) by mouth daily. 09/08/21   Elson Areas, PA-C  ?ARIPiprazole (ABILIFY) 30 MG tablet Take 1 tablet (30 mg total) by mouth daily. 09/08/21   Elson Areas, PA-C  ?guanFACINE (INTUNIV) 1 MG TB24 ER tablet Take 1 tablet (1 mg total) by mouth at bedtime. 09/08/21   Elson Areas, PA-C  ?metFORMIN (GLUCOPHAGE-XR) 500 MG 24 hr tablet Take 1 tablet (500 mg total) by mouth daily with breakfast. 09/08/21   Elson Areas, PA-C  ?nicotine polacrilex (NICORETTE) 2 MG gum Take 1 each (2 mg total) by mouth every 4 (four) hours as needed for smoking cessation. 09/08/21   Elson Areas, PA-C  ?traZODone (DESYREL) 100 MG tablet Take 2 tablets (200 mg total) by mouth at bedtime. 09/08/21   Elson Areas, PA-C  ?   ? ?Allergies    ?Risperdal [risperidone]   ? ?Review of Systems   ?Review of Systems  ?Psychiatric/Behavioral:  Positive for suicidal ideas.   ?All other systems reviewed and are negative. ? ?Physical Exam ?Updated Vital Signs ?BP (!) 137/96 (BP Location: Right Arm)   Pulse 71   Temp 98.8 ?F (37.1 ?C) (Oral)   Resp 19   Ht 6' (1.829 m)    Wt 88 kg   SpO2 100%   BMI 26.31 kg/m?  ? ?Physical Exam ?Vitals and nursing note reviewed.  ?Constitutional:   ?   Appearance: He is well-developed.  ?HENT:  ?   Head: Normocephalic and atraumatic.  ?Eyes:  ?   Conjunctiva/sclera: Conjunctivae normal.  ?   Pupils: Pupils are equal, round, and reactive to light.  ?Cardiovascular:  ?   Rate and Rhythm: Normal rate and regular rhythm.  ?   Heart sounds: Normal heart sounds.  ?Pulmonary:  ?   Effort: Pulmonary effort is normal.  ?   Breath sounds: Normal breath sounds.  ?Abdominal:  ?   General: Bowel sounds are normal.  ?   Palpations: Abdomen is soft.  ?Musculoskeletal:     ?   General: Normal range of motion.  ?   Cervical back: Normal range of motion.  ?Skin: ?   General: Skin is warm and dry.  ?Neurological:  ?   Mental Status: He is alert and oriented to person, place, and time.  ?Psychiatric:  ?   Comments: SI with attempted OD ?Denies HI/AVH  ? ? ?ED Results / Procedures / Treatments   ?Labs ?(all labs ordered are listed, but only abnormal results are displayed) ?Labs Reviewed  ?CBC WITH DIFFERENTIAL/PLATELET - Abnormal; Notable for the  following components:  ?    Result Value  ? Hemoglobin 12.9 (*)   ? MCV 74.9 (*)   ? MCH 24.4 (*)   ? RDW 16.3 (*)   ? All other components within normal limits  ?COMPREHENSIVE METABOLIC PANEL - Abnormal; Notable for the following components:  ? AST 46 (*)   ? All other components within normal limits  ?SALICYLATE LEVEL - Abnormal; Notable for the following components:  ? Salicylate Lvl <7.0 (*)   ? All other components within normal limits  ?ACETAMINOPHEN LEVEL - Abnormal; Notable for the following components:  ? Acetaminophen (Tylenol), Serum <10 (*)   ? All other components within normal limits  ?RESP PANEL BY RT-PCR (FLU A&B, COVID) [id number]  ?RAPID URINE DRUG SCREEN, HOSP PERFORMED  ?ETHANOL  ? ? ?EKG ?EKG Interpretation ? ?Date/Time:  Friday Sep 15 2021 23:43:40 EDT ?Ventricular Rate:  69 ?PR Interval:  161 ?QRS  Duration: 91 ?QT Interval:  386 ?QTC Calculation: 414 ?R Axis:   -89 ?Text Interpretation: Sinus rhythm Left anterior fascicular block ST elev, probable normal early repol pattern No significant change was found Confirmed by Paula Libra ([id number]) on 09/16/2021 12:20:59 AM ? ?Radiology ?No results found. ? ?Procedures ?Procedures  ? ? ?Medications Ordered in ED ?Medications - No data to display ? ?ED Course/ Medical Decision Making/ A&P ?  ?                        ?Medical Decision Making ?Amount and/or Complexity of Data Reviewed ?Labs: ordered. ?ECG/medicine tests: ordered and independent interpretation performed. ? ? ?31 year old male presenting to the ED with suicide attempt after taking 600 mg of dextromethorphan throughout the day today.  States he was trying to end his life.  Reports he has "nothing else to live for anyway".  Denies any HI/AVH.  Denies any other coingestions.  EKG sinus rhythm without acute ischemic changes.  Labs obtained and reviewed, no significant findings.  His vitals remain stable and he is able to answer questions normally.  Medically cleared. Will get TTS consultation. ? ?6:20 AM ?TTS not completed overnight.  Day team to follow-up on recommendations. ?Final Clinical Impression(s) / ED Diagnoses ?Final diagnoses:  ?Suicidal ideation  ? ? ?Rx / DC Orders ?ED Discharge Orders   ? ? None  ? ?  ? ? ?  ?Garlon Hatchet, PA-C ?09/16/21 3295 ? ?  ?Paula Libra, MD ?09/16/21 [phone] ? ?

## 2021-09-16 ENCOUNTER — Ambulatory Visit (HOSPITAL_COMMUNITY)
Admission: EM | Admit: 2021-09-16 | Discharge: 2021-09-18 | Disposition: A | Discharge status: Home / Self Care | Payer: Medicaid Other | Source: Home / Self Care

## 2021-09-16 ENCOUNTER — Other Ambulatory Visit: Payer: Self-pay

## 2021-09-16 DIAGNOSIS — Z9151 Personal history of suicidal behavior: Secondary | ICD-10-CM | POA: Insufficient documentation

## 2021-09-16 DIAGNOSIS — Z5900 Homelessness unspecified: Secondary | ICD-10-CM | POA: Insufficient documentation

## 2021-09-16 DIAGNOSIS — Z720 Tobacco use: Secondary | ICD-10-CM | POA: Insufficient documentation

## 2021-09-16 DIAGNOSIS — R45851 Suicidal ideations: Secondary | ICD-10-CM | POA: Insufficient documentation

## 2021-09-16 DIAGNOSIS — T1491XA Suicide attempt, initial encounter: Secondary | ICD-10-CM

## 2021-09-16 DIAGNOSIS — T483X2A Poisoning by antitussives, intentional self-harm, initial encounter: Secondary | ICD-10-CM | POA: Insufficient documentation

## 2021-09-16 DIAGNOSIS — F250 Schizoaffective disorder, bipolar type: Secondary | ICD-10-CM

## 2021-09-16 DIAGNOSIS — Y929 Unspecified place or not applicable: Secondary | ICD-10-CM | POA: Insufficient documentation

## 2021-09-16 DIAGNOSIS — Z560 Unemployment, unspecified: Secondary | ICD-10-CM | POA: Insufficient documentation

## 2021-09-16 DIAGNOSIS — F909 Attention-deficit hyperactivity disorder, unspecified type: Secondary | ICD-10-CM | POA: Insufficient documentation

## 2021-09-16 DIAGNOSIS — F259 Schizoaffective disorder, unspecified: Secondary | ICD-10-CM | POA: Insufficient documentation

## 2021-09-16 DIAGNOSIS — Z79899 Other long term (current) drug therapy: Secondary | ICD-10-CM | POA: Insufficient documentation

## 2021-09-16 LAB — CBC WITH DIFFERENTIAL/PLATELET
Abs Immature Granulocytes: 0.02 10*3/uL (ref 0.00–0.07)
Basophils Absolute: 0.1 10*3/uL (ref 0.0–0.1)
Basophils Relative: 1 %
Eosinophils Absolute: 0 10*3/uL (ref 0.0–0.5)
Eosinophils Relative: 1 %
HCT: 39.6 % (ref 39.0–52.0)
Hemoglobin: 12.9 g/dL — ABNORMAL LOW (ref 13.0–17.0)
Immature Granulocytes: 0 %
Lymphocytes Relative: 16 %
Lymphs Abs: 1.2 10*3/uL (ref 0.7–4.0)
MCH: 24.4 pg — ABNORMAL LOW (ref 26.0–34.0)
MCHC: 32.6 g/dL (ref 30.0–36.0)
MCV: 74.9 fL — ABNORMAL LOW (ref 80.0–100.0)
Monocytes Absolute: 0.6 10*3/uL (ref 0.1–1.0)
Monocytes Relative: 8 %
Neutro Abs: 5.8 10*3/uL (ref 1.7–7.7)
Neutrophils Relative %: 74 %
Platelets: 324 10*3/uL (ref 150–400)
RBC: 5.29 MIL/uL (ref 4.22–5.81)
RDW: 16.3 % — ABNORMAL HIGH (ref 11.5–15.5)
WBC: 7.7 10*3/uL (ref 4.0–10.5)
nRBC: 0 % (ref 0.0–0.2)

## 2021-09-16 LAB — RESP PANEL BY RT-PCR (FLU A&B, COVID) ARPGX2
Influenza A by PCR: NEGATIVE
Influenza B by PCR: NEGATIVE
SARS Coronavirus 2 by RT PCR: NEGATIVE

## 2021-09-16 LAB — RAPID URINE DRUG SCREEN, HOSP PERFORMED
Amphetamines: NOT DETECTED
Barbiturates: NOT DETECTED
Benzodiazepines: NOT DETECTED
Cocaine: NOT DETECTED
Opiates: NOT DETECTED
Tetrahydrocannabinol: NOT DETECTED

## 2021-09-16 LAB — ETHANOL: Alcohol, Ethyl (B): <10 mg/dL (ref <10)

## 2021-09-16 LAB — COMPREHENSIVE METABOLIC PANEL
ALT: 35 U/L (ref 0–44)
AST: 46 U/L — ABNORMAL HIGH (ref 15–41)
Albumin: 4.5 g/dL (ref 3.5–5.0)
Alkaline Phosphatase: 43 U/L (ref 38–126)
Anion gap: 7 (ref 5–15)
BUN: 17 mg/dL (ref 6–20)
CO2: 26 mmol/L (ref 22–32)
Calcium: 9.3 mg/dL (ref 8.9–10.3)
Chloride: 108 mmol/L (ref 98–111)
Creatinine, Ser: 1.07 mg/dL (ref 0.61–1.24)
GFR, Estimated: >60 mL/min (ref >60)
Glucose, Bld: 91 mg/dL (ref 70–99)
Potassium: 4.3 mmol/L (ref 3.5–5.1)
Sodium: 141 mmol/L (ref 135–145)
Total Bilirubin: 0.9 mg/dL (ref 0.3–1.2)
Total Protein: 7.2 g/dL (ref 6.5–8.1)

## 2021-09-16 LAB — SALICYLATE LEVEL: Salicylate Lvl: <7 mg/dL — ABNORMAL LOW (ref 7.0–30.0)

## 2021-09-16 LAB — ACETAMINOPHEN LEVEL: Acetaminophen (Tylenol), Serum: <10 ug/mL — ABNORMAL LOW (ref 10–30)

## 2021-09-16 MED ORDER — GUANFACINE HCL ER 1 MG PO TB24
1.0000 mg | ORAL_TABLET | Freq: Every day | ORAL | Status: DC
  Filled 2021-09-16: qty 1

## 2021-09-16 MED ORDER — METFORMIN HCL ER 500 MG PO TB24
500.0000 mg | ORAL_TABLET | Freq: Every day | ORAL | Status: DC
  Filled 2021-09-16: qty 1

## 2021-09-16 MED ORDER — TRAZODONE HCL 50 MG PO TABS: 50.0000 mg | ORAL_TABLET | Freq: Every evening | ORAL | Status: DC | PRN

## 2021-09-16 MED ORDER — ARIPIPRAZOLE 15 MG PO TABS
30.0000 mg | ORAL_TABLET | Freq: Every day | ORAL | Status: DC
  Administered 2021-09-16: 30 mg via ORAL
  Filled 2021-09-16: qty 2

## 2021-09-16 MED ORDER — MAGNESIUM HYDROXIDE 400 MG/5ML PO SUSP: 30.0000 mL | Freq: Every day | ORAL | Status: DC | PRN

## 2021-09-16 MED ORDER — TRAZODONE HCL 100 MG PO TABS
200.0000 mg | ORAL_TABLET | Freq: Every day | ORAL | Status: DC
  Filled 2021-09-16: qty 2

## 2021-09-16 MED ORDER — ALUM & MAG HYDROXIDE-SIMETH 200-200-20 MG/5ML PO SUSP: 30.0000 mL | ORAL | Status: DC | PRN

## 2021-09-16 MED ORDER — ACETAMINOPHEN 325 MG PO TABS: 650.0000 mg | ORAL_TABLET | Freq: Four times a day (QID) | ORAL | Status: DC | PRN

## 2021-09-16 MED ORDER — HYDROXYZINE HCL 25 MG PO TABS
25.0000 mg | ORAL_TABLET | Freq: Three times a day (TID) | ORAL | Status: DC | PRN
  Filled 2021-09-16: qty 1

## 2021-09-16 MED ORDER — AMLODIPINE BESYLATE 5 MG PO TABS
5.0000 mg | ORAL_TABLET | Freq: Every day | ORAL | Status: DC
  Filled 2021-09-16 (×2): qty 1

## 2021-09-16 NOTE — ED Notes (Signed)
Security called to wand patient 

## 2021-09-16 NOTE — ED Notes (Signed)
Report called to [facility]. [facility] called for transport.  ?
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
## 2021-09-16 NOTE — ED Provider Notes (Signed)
[facility]gent [facility] Continuous Assessment Admission H&P ? ?Date: 09/17/21 ?Patient Name: John Copeland ?MRN: [id number] ?Chief Complaint: intentional over dose ?Chief Complaint  ?Patient presents with  ? Suicidal  ?   ? ?Diagnoses:  ?   ?Final diagnoses:  ?Suicide attempt [facility])  ? ? ?HPI: John Copeland is a 31 y/o male presenting to [facility] after being admitted to [facility] for an intentional overdose. Patient has a history of schizoaffective disorder, ADHD and 2 prior suicide attempts and multiple hospitalizations. Patient was brought in to [facility] on 09/15/21 via EMS after an intentional over dose of 600mg  of Dextromethorphan in a suicide attempt. Patient was recommended for [facility] continuous assessment. Patient has a legal guardian [facility] [name] and he is currently living with some friends.  Patient endorses current suicidal ideations but no specific plan or intent to do harm to himself at this moment.  Patient recently presented to [facility] on 09/08/21 with c/o needing somewhere to stay and requesting to go to rehab. Patient was d/ced at that time and recommended f/u with OPT and med management.  ? ?Upon examination patient is dressed in burgundy scrubs and lying across 3 chairs with his eyes closed.  Patient is alert oriented x4, calm and cooperative and does not appear to be responding to any internal or external stimuli at this time.  Patient denies any current substance use and UDS is negative.Denies any HI or AVH. patient states that he attempted the overdose because he does not have anything to live for and " my family does not care about me".  Patient is unable to contract for safety at this time and will be admitted to [facility] continuous assessment for safety, crisis management and stabilization. ? ? ? ?PHQ 2-9:  ?Flowsheet Row ED from 09/04/2021 in [facility]  ?Thoughts that you would be better off dead, or of hurting yourself in some way Not at all  ?PHQ-9 Total  Score 8  ? ?  ?  ?Flowsheet Row ED from 09/16/2021 in [facility] ED from 09/15/2021 in [facility] ED from 09/08/2021 in [facility] EMERGENCY DEPARTMENT  ?C-SSRS RISK CATEGORY High Risk High Risk No Risk  ? ?  ?  ? ?Total Time spent with patient: 30 minutes ? ?Musculoskeletal  ?Strength & Muscle Tone: within normal limits ?Gait & Station: normal ?Patient leans: N/A ? ?Psychiatric Specialty Exam  ?Presentation ?General Appearance: Casual ? ?Eye Contact:Fleeting ? ?Speech:Clear and Coherent ? ?Speech Volume:Normal ? ?Handedness:Right ? ? ?Mood and Affect  ?Mood:Depressed ? ?Affect:Flat; Blunt ? ? ?Thought Process  ?Thought Processes:Coherent ? ?Descriptions of Associations:Intact ? ?Orientation:Full (Time, Place and Person) ? ?Thought Content:WDL ? Diagnosis of Schizophrenia or Schizoaffective disorder in past: Yes ?  ?Hallucinations:Hallucinations: None ? ?Ideas of Reference:None ? ?Suicidal Thoughts:Suicidal Thoughts: Yes, Passive ?SI Passive Intent and/or Plan: Without Intent; Without Plan ? ?Homicidal Thoughts:Homicidal Thoughts: No ? ? ?Sensorium  ?Memory:Immediate Fair; Recent Fair; Remote Fair ? ?Judgment:Fair ? ?Insight:Fair ? ? ?Executive Functions  ?Concentration:Fair ? ?Attention Span:Fair ? ?Recall:Fair ? ?Fund of Knowledge:Fair ? ?Language:Fair ? ? ?Psychomotor Activity  ?Psychomotor Activity:Psychomotor Activity: Normal ? ? ?Assets  ?Assets:Desire for Improvement; Resilience; Physical Health ? ? ?Sleep  ?Sleep:Sleep: Good ?Number of Hours of Sleep: -1 ? ? ?Nutritional Assessment (For OBS and FBC admissions only) ?Has the patient had a weight loss or gain of 10 pounds or more in the last 3 months?:  No ?Has the patient had a decrease in food intake/or appetite?: No ?Does the patient have dental problems?: No ?Does the patient have eating habits or behaviors that may be indicators of an eating disorder including binging or inducing  vomiting?: No ?Has the patient recently lost weight without trying?: 0 ?Has the patient been eating poorly because of a decreased appetite?: 0 ?Malnutrition Screening Tool Score: 0 ? ? ? ?Physical Exam ?HENT:  ?   Head: Normocephalic and atraumatic.  ?   Nose: Nose normal.  ?Eyes:  ?   Pupils: Pupils are equal, round, and reactive to light.  ?Cardiovascular:  ?   Rate and Rhythm: Normal rate.  ?Pulmonary:  ?   Effort: Pulmonary effort is normal.  ?Abdominal:  ?   General: Abdomen is flat.  ?Musculoskeletal:     ?   General: Normal range of motion.  ?   Cervical back: Normal range of motion.  ?Skin: ?   General: Skin is warm.  ?Neurological:  ?   Mental Status: He is alert and oriented to person, place, and time.  ?Psychiatric:     ?   Attention and Perception: He is inattentive.     ?   Mood and Affect: Mood is depressed. Affect is blunt and flat.     ?   Speech: Speech normal.     ?   Behavior: Behavior is withdrawn.     ?   Thought Content: Thought content includes suicidal ideation. Thought content does not include homicidal ideation. Thought content does not include homicidal or suicidal plan.     ?   Cognition and Memory: Cognition normal.     ?   Judgment: Judgment is impulsive.  ? ?Review of Systems  ?Constitutional: Negative.   ?HENT: Negative.    ?Eyes: Negative.   ?Respiratory: Negative.    ?Cardiovascular: Negative.   ?Gastrointestinal: Negative.   ?Genitourinary: Negative.   ?Musculoskeletal: Negative.   ?Skin: Negative.   ?Neurological: Negative.   ?Endo/Heme/Allergies: Negative.   ?Psychiatric/Behavioral:  Positive for depression and suicidal ideas. Negative for hallucinations.   ? ?Blood pressure 135/83, pulse 65, temperature 98.5 ?F (36.9 ?C), temperature source Oral, resp. rate 18, SpO2 100 %. There is no height or weight on file to calculate BMI. ? ?Past Psychiatric History: [facility]  ? ?Is the patient at risk to self? Yes  ?Has the patient been a risk to self in the past 6 months? Yes .     ?Has the patient been a risk to self within the distant past? Yes   ?Is the patient a risk to others? No   ?Has the patient been a risk to others in the past 6 months? No   ?Has the patient been a risk to others within the distant past? No  ? ?Past Medical History:  ?Past Medical History:  ?Diagnosis Date  ? Anxiety   ? Depression   ? Diabetes mellitus without complication (HCC)   ? Homelessness   ? Hypertension   ? Schizophrenia (HCC)   ? No past surgical history on file. ? ?Family History:  ?Family History  ?Problem Relation Age of Onset  ? Diabetes Other   ? ? ?Social History:  ?Social History  ? ?Socioeconomic History  ? Marital status: Single  ?  Spouse name: Not on file  ? Number of children: Not on file  ? Years of education: Not on file  ? Highest education level: Not on file  ?Occupational History  ?  Not on file  ?Tobacco Use  ? Smoking status: Every Day  ?  Packs/day: 0.00  ?  Types: Cigarettes  ? Smokeless tobacco: Never  ?Vaping Use  ? Vaping Use: Never used  ?Substance and Sexual Activity  ? Alcohol use: Yes  ? Drug use: Not Currently  ?  Types: Marijuana  ?  Comment: denies drug use  ? Sexual activity: Not Currently  ?Other Topics Concern  ? Not on file  ?Social History Narrative  ? Not on file  ? ?Social Determinants of Health  ? ?Financial Resource Strain: Not on file  ?Food Insecurity: Not on file  ?Transportation Needs: Not on file  ?Physical Activity: Not on file  ?Stress: Not on file  ?Social Connections: Not on file  ?Intimate Partner Violence: Not on file  ? ? ?SDOH:  ?SDOH Screenings  ? ?Alcohol Screen: Not on file  ?Depression (PHQ2-9): Medium Risk  ? PHQ-2 Score: 8  ?Financial Resource Strain: Not on file  ?Food Insecurity: Not on file  ?Housing: Not on file  ?Physical Activity: Not on file  ?Social Connections: Not on file  ?Stress: Not on file  ?Tobacco Use: High Risk  ? Smoking Tobacco Use: Every Day  ? Smokeless Tobacco Use: Never  ? Passive Exposure: Not on file  ?Transportation  Needs: Not on file  ? ? ?Last Labs:  ?Admission on 09/15/2021, Discharged on 09/16/2021  ?Component Date Value Ref Range Status  ? WBC 09/16/2021 7.7  4.0 - 10.5 K/uL Final  ? RBC 09/16/2021 5.29  4.22 - 5

## 2021-09-16 NOTE — ED Provider Notes (Addendum)
Patient accepted at [facility] for further psychiatric evaluation. ?  ?Cheryll Cockayne, MD ?09/16/21 2042 ? ?  ?Cheryll Cockayne, MD ?09/16/21 2042 ? ?
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
## 2021-09-16 NOTE — ED Notes (Signed)
Pt changed into purple scrubs, belongings placed in hospital bags. Pt has one patient belongings bag and one black book bag, both labeled and placed in nurse's station cabinet 16-22. ?

## 2021-09-16 NOTE — ED Notes (Signed)
Pt A&O x 4, transfer from [facility], presents with suicidal ideations, pt ingested 600 mg's of cough syrup as SI attempt.  Denies HI or AVH.  Pt is from Group Home.  Calm & cooperative at present, no distress noted.  Monitoring for safety. ?

## 2021-09-17 ENCOUNTER — Encounter (HOSPITAL_COMMUNITY): Payer: Self-pay | Admitting: Emergency Medicine

## 2021-09-17 MED ORDER — ARIPIPRAZOLE 10 MG PO TABS
10.0000 mg | ORAL_TABLET | Freq: Every day | ORAL | Status: DC
  Filled 2021-09-17: qty 1

## 2021-09-17 MED ORDER — ATOMOXETINE HCL 25 MG PO CAPS
25.0000 mg | ORAL_CAPSULE | Freq: Every day | ORAL | Status: DC
  Administered 2021-09-17 – 2021-09-18 (×2): 25 mg via ORAL
  Filled 2021-09-17 (×2): qty 1

## 2021-09-17 MED ORDER — LORAZEPAM 1 MG PO TABS
2.0000 mg | ORAL_TABLET | Freq: Four times a day (QID) | ORAL | Status: DC | PRN
  Filled 2021-09-17: qty 2

## 2021-09-17 NOTE — ED Notes (Signed)
Pt was sleeping on the pull out bed and then he got up and came to the nursing desk. Demanding to have his Bible. Offered a Bible that is on the unit. Pt stated to staff, "Y'all are getting on my nerves. Been getting on my nerves all day". Pt became louder and began to curse the staff. Continues to stand at the nursing desk and demand to have his Bible. Explained to Pt that his Bible was locked up in the locker for a reason and he could not have it. Other staff members attempted to talk to Pt also, unsuccessful. Safety maintained and will continue to monitor.  ?

## 2021-09-17 NOTE — ED Notes (Signed)
Pt sleeping on pull out bed/chair. No s&s of distress observed. Safety maintained and will continue to monitor.  ?

## 2021-09-17 NOTE — ED Notes (Signed)
Pt sleeping at present, no distress noted. Respirations even & unlabored.  Monitoring for safety. 

## 2021-09-17 NOTE — ED Notes (Signed)
Pt requested items for a shower. Told him that this nurse would get those things as soon as she finished writing a note. Questioned Pt was there something wrong with the toilet because he kept flushing it so many times. Pt requested to have items to take a shower again instead of responding to the question. Informed Pt that the items would be coming soon. Questioned Pt again where there something wrong with the toilet because he kept flushing so many times. Pt responded by saying, "I did not want to clog it up. I was using the bathroom". The shower items were provided. Pt stated to this nurse, "I treat people the way they treat me. You treated me mean so I am going to treat you mean". Explained to Pt that this nurse has not done anything mean to him. Pt went to take a shower. ?

## 2021-09-17 NOTE — ED Notes (Signed)
PT eating a hot meal and 2 juices ?

## 2021-09-17 NOTE — ED Notes (Signed)
Pt began shouting at this nurse, "You're a fat and ugly bitch. You don't know anything about me. Stop talking about me. My mama died when I was 31 years old. I'm not rich like you. I didn't come come from a rich family like you did". Attempted to explain to Pt that this nurse had not said anything about him. I had been reading the computer. Other staff members attempted to inform Pt that he needed to calm down. Pt began shouting again, "All of y'all are fat, ugly bitches!". Pt was encouraged to read the Bible that he had previously demanded to have back. Safety maintained. ?

## 2021-09-17 NOTE — ED Notes (Signed)
Pt sleeping at present, no distress noted.  Monitoring for safety. 

## 2021-09-17 NOTE — ED Notes (Signed)
Pt was given 1 bag of chips and 1 grape juice ?
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
## 2021-09-17 NOTE — ED Notes (Signed)
Pt A&O x4, resting at present,no distress noted.  Monitoring for safety. 

## 2021-09-17 NOTE — ED Notes (Signed)
Pt provided 2 cereals, muffin, milk and 2 juices. Pt requested to have another muffin and milk. Informed Pt that only 1 muffin is provided at a time. Pt came to the window and stated to this nurse, "Did I make you mad? You seem like you have an attitude".  Informed Pt that this nurse was not mad and that he did not upset me.  ?

## 2021-09-17 NOTE — Progress Notes (Addendum)
The patient requested to speak with CSW in reference to resources for housing and shelter. The patient reports he has a payee and a legal guardian. Further, it should be noted for documentation purposes that the patient mentioned that while in [facility] for the past year is disability check was discontinued and he was informed once he was out that social security needed to be notified for him to be able to receive his funds again. The patient asked social worker to reach out to legal guardian, which appears to be Pate Bernard/Phone#: [phone]. CSW attempted to make contact for collateral information in this matter, however was only able to leave a confidential voicemail. CSW looked further into patient's notes, with hopes of finding information on a payee contact information and found from [employer] note that a Corey Harold with [facility]/Phone#: [phone] as the legal guardian. CSW attempted to contact, left a confidential voicemail. A crisis number was provided; [phone]. It was confirmed that Corey Harold is the legal guardian.  ? ?Glennie Isle, MSW, LCSW-A, LCAS ?Phone: [phone] ?Disposition/TOC ? ?

## 2021-09-17 NOTE — ED Notes (Signed)
Patient irritable aeb having hostile tones with staff. Patient disrespectful yet redirectable sometimes. Patient safe on unit with continued monitoring. ?

## 2021-09-17 NOTE — ED Notes (Signed)
Pt continues to stand at the nursing desk and yell and stay that the staff woke him up this morning and then again this afternoon. Explained to Pt that this nurse has daily charting to do along with his morning meds that needed to be offered. Pt has been taking care of by giving him food every time he asked, shower items when he asked and snacks provided whenever he asked. This was relayed to the Pt.  ?

## 2021-09-17 NOTE — ED Notes (Signed)
Patient denied vitals

## 2021-09-17 NOTE — ED Notes (Signed)
Requested for Pt to pick up his food items, trash and linen from the floor. Pt stated, "I will do it when I feel like it. Can I take a shower?". Questioned Pt, "Did you not take a shower this morning?". Pt began to curse this nurse and call her a "fat, ugly bitch". Pt then began to pace the floor and stare out of the window. Then he proceeded to state, "I have a girlfriend and I am going to call her right now". ?

## 2021-09-17 NOTE — ED Notes (Signed)
Pt refused scheduled morning meds. Stated, "I am not diabetic and the BP med makes me sleepy" when questioned if he knew what the meds were for. Meal provided. Denied SI/HI/AVH. Stated he needed to call his case worker. Informed Pt where and how to call out on the phone. Safety maintained and will continue to monitor.  ?

## 2021-09-18 MED ORDER — AMLODIPINE BESYLATE 5 MG PO TABS: 5.0000 mg | ORAL_TABLET | Freq: Every day | ORAL | 0 refills | Status: DC

## 2021-09-18 MED ORDER — FLUTICASONE PROPIONATE 50 MCG/ACT NA SUSP
1.0000 | Freq: Every day | NASAL | Status: DC
  Administered 2021-09-18: 1 via NASAL
  Filled 2021-09-18: qty 16

## 2021-09-18 MED ORDER — ARIPIPRAZOLE 10 MG PO TABS: 10.0000 mg | ORAL_TABLET | Freq: Every day | ORAL | 0 refills | Status: DC

## 2021-09-18 NOTE — ED Notes (Signed)
Pt sleeping at present, no distress noted.  Monitoring for safety. 

## 2021-09-18 NOTE — ED Notes (Signed)
Patient A&O x 4, ambulatory. Patient discharged in no acute distress. Patient denied SI/HI, A/VH upon discharge. Patient verbalized understanding of all discharge instructions explained by staff, to include follow up appointments, RX's and safety plan. Patient reported mood 10/10. Pt belongings returned to patient from locker #19    intact. Patient escorted to lobby via staff with bus pass in hand. Safety maintained.   ?
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
## 2021-09-18 NOTE — ED Notes (Signed)
Pt A&) x3. Denies SI/HI/AVH. Answer all questions appropriately asked by staff. Denies concerns. Presently taking a shower. Bkft eaten. Informed pt to notify staff with any needs or concerns. Will continue to monitor for safety. ?

## 2021-09-18 NOTE — ED Notes (Signed)
Pt approached nurses station requesting medication for nasal sinus irritation and toe numbness. Cause unknown. Pt ambulating around room without difficulty. RN sent message to provider via secure chat. Safety maintained. ?

## 2021-09-18 NOTE — ED Notes (Signed)
Patient is going to be discharged. ?

## 2021-09-18 NOTE — ED Notes (Addendum)
Pt received Strattera this am but refused all other medication to include Amlodipine and Abilify), even after encouragement from staff. NP notified via secure chat. ?

## 2021-09-18 NOTE — Discharge Instructions (Addendum)
Patient agreeable to plan. Given opportunity to ask questions. Appears to feel comfortable with discharge denies any current suicidal or homicidal thought.  Denies AVH. ? ?Take all medications as prescribed by mental healthcare provider. Report any adverse effects and or reactions from the medicines to outpatient provider promptly. Patient has been instructed & cautioned: To not engage in alcohol and or illegal drug use while on prescription medicines. In the event of worsening symptoms,  patient is instructed to call the crisis hotline, 911 and or go to the nearest ED for appropriate evaluation and treatment of symptoms. To follow-up with primary care provider for other medical issues, concerns and or health care needs ? ? ? ? ? ?Please come to [facility] (this facility) during walk in hours for appointment with psychiatrist/provider for further medication management and for therapists for therapy.  ? ? Walk-Ins for medication management  are available on Monday, Wednesday, Thursday and Friday from 8am-11am.  It is first come, first -serve; it is best to arrive by 7:00 AM.  ? ? Walk-Ins for therapy are  available on Monday and Wednesday?s  8am-11am.  It is first come, first -serve; it is best to arrive by 7:00 AM.  ? ? ?When you arrive please go upstairs for your appointment. If you are unsure of where to go, inform the front desk that you are here for a walk in appointment and they will assist you with directions upstairs. ? ?Address:  ?[address], in [place], [address] ?Ph: [phone]  ? ? ?

## 2021-09-18 NOTE — ED Notes (Signed)
Pt sleeping in no acute distress. RR even and unlabored. Safety maintained. 

## 2021-09-20 ENCOUNTER — Encounter (HOSPITAL_COMMUNITY): Payer: Self-pay | Admitting: Emergency Medicine

## 2021-09-20 ENCOUNTER — Other Ambulatory Visit: Payer: Self-pay

## 2021-09-20 ENCOUNTER — Ambulatory Visit (HOSPITAL_COMMUNITY)
Admission: EM | Admit: 2021-09-20 | Discharge: 2021-09-20 | Disposition: A | Discharge status: Home / Self Care | Payer: No Typology Code available for payment source | Attending: Psychiatry | Admitting: Psychiatry

## 2021-09-20 ENCOUNTER — Emergency Department (HOSPITAL_COMMUNITY)
Admission: EM | Admit: 2021-09-20 | Discharge: 2021-09-21 | Disposition: A | Discharge status: Home / Self Care | Payer: Medicaid Other | Attending: Emergency Medicine | Admitting: Emergency Medicine

## 2021-09-20 DIAGNOSIS — Z760 Encounter for issue of repeat prescription: Secondary | ICD-10-CM | POA: Insufficient documentation

## 2021-09-20 DIAGNOSIS — Z9151 Personal history of suicidal behavior: Secondary | ICD-10-CM | POA: Insufficient documentation

## 2021-09-20 DIAGNOSIS — Z59819 Housing instability, housed unspecified: Secondary | ICD-10-CM | POA: Diagnosis not present

## 2021-09-20 DIAGNOSIS — Z20822 Contact with and (suspected) exposure to covid-19: Secondary | ICD-10-CM | POA: Insufficient documentation

## 2021-09-20 DIAGNOSIS — T391X1A Poisoning by 4-Aminophenol derivatives, accidental (unintentional), initial encounter: Secondary | ICD-10-CM | POA: Diagnosis present

## 2021-09-20 DIAGNOSIS — Z79899 Other long term (current) drug therapy: Secondary | ICD-10-CM | POA: Insufficient documentation

## 2021-09-20 DIAGNOSIS — I10 Essential (primary) hypertension: Secondary | ICD-10-CM | POA: Insufficient documentation

## 2021-09-20 DIAGNOSIS — R45851 Suicidal ideations: Secondary | ICD-10-CM | POA: Insufficient documentation

## 2021-09-20 DIAGNOSIS — F250 Schizoaffective disorder, bipolar type: Secondary | ICD-10-CM | POA: Diagnosis not present

## 2021-09-20 DIAGNOSIS — Z5900 Homelessness unspecified: Secondary | ICD-10-CM | POA: Diagnosis not present

## 2021-09-20 DIAGNOSIS — R4689 Other symptoms and signs involving appearance and behavior: Secondary | ICD-10-CM | POA: Insufficient documentation

## 2021-09-20 DIAGNOSIS — F259 Schizoaffective disorder, unspecified: Secondary | ICD-10-CM | POA: Insufficient documentation

## 2021-09-20 DIAGNOSIS — Z789 Other specified health status: Secondary | ICD-10-CM

## 2021-09-20 LAB — BASIC METABOLIC PANEL
Anion gap: 6 (ref 5–15)
BUN: 14 mg/dL (ref 6–20)
CO2: 26 mmol/L (ref 22–32)
Calcium: 9.2 mg/dL (ref 8.9–10.3)
Chloride: 105 mmol/L (ref 98–111)
Creatinine, Ser: 0.93 mg/dL (ref 0.61–1.24)
GFR, Estimated: >60 mL/min (ref >60)
Glucose, Bld: 85 mg/dL (ref 70–99)
Potassium: 4.4 mmol/L (ref 3.5–5.1)
Sodium: 137 mmol/L (ref 135–145)

## 2021-09-20 LAB — CBC WITH DIFFERENTIAL/PLATELET
Abs Immature Granulocytes: 0.01 10*3/uL (ref 0.00–0.07)
Basophils Absolute: 0.1 10*3/uL (ref 0.0–0.1)
Basophils Relative: 1 %
Eosinophils Absolute: 0.1 10*3/uL (ref 0.0–0.5)
Eosinophils Relative: 1 %
HCT: 39 % (ref 39.0–52.0)
Hemoglobin: 12.1 g/dL — ABNORMAL LOW (ref 13.0–17.0)
Immature Granulocytes: 0 %
Lymphocytes Relative: 37 %
Lymphs Abs: 2.2 10*3/uL (ref 0.7–4.0)
MCH: 23.3 pg — ABNORMAL LOW (ref 26.0–34.0)
MCHC: 31 g/dL (ref 30.0–36.0)
MCV: 75.1 fL — ABNORMAL LOW (ref 80.0–100.0)
Monocytes Absolute: 0.5 10*3/uL (ref 0.1–1.0)
Monocytes Relative: 8 %
Neutro Abs: 3.1 10*3/uL (ref 1.7–7.7)
Neutrophils Relative %: 53 %
Platelets: 311 10*3/uL (ref 150–400)
RBC: 5.19 MIL/uL (ref 4.22–5.81)
RDW: 15.8 % — ABNORMAL HIGH (ref 11.5–15.5)
WBC: 5.9 10*3/uL (ref 4.0–10.5)
nRBC: 0 % (ref 0.0–0.2)

## 2021-09-20 LAB — ACETAMINOPHEN LEVEL: Acetaminophen (Tylenol), Serum: <10 ug/mL — ABNORMAL LOW (ref 10–30)

## 2021-09-20 MED ORDER — AMLODIPINE BESYLATE 5 MG PO TABS
5.0000 mg | ORAL_TABLET | Freq: Every day | ORAL | Status: DC
  Filled 2021-09-20: qty 7

## 2021-09-20 MED ORDER — ARIPIPRAZOLE 10 MG PO TABS
10.0000 mg | ORAL_TABLET | Freq: Every day | ORAL | Status: DC
Start: 2021-09-21 — End: 2021-09-20
  Filled 2021-09-20: qty 7

## 2021-09-20 NOTE — ED Triage Notes (Addendum)
Pt reports taking 7 tabs of tylenol because he was having body pain and then got worried he had taken too much.  NAD at time of triage. Denies SI/HI  States he did not take the tylenol to hurt himself.  ? ?

## 2021-09-20 NOTE — ED Provider Triage Note (Signed)
Emergency Medicine Provider Triage Evaluation Note ? ?John Copeland , a 31 y.o. male  was evaluated in triage.  Pt complains of taken too much Tylenol.  He states he took 7 Tylenol pills.  He is unsure of the dosage.  He states he was a little blue pill.  States he took it for body aches.  Denies SI, HI, or AVH. ? ?Review of Systems  ?Positive: As above ?Negative: as above ? ?Physical Exam  ?BP (!) 169/102   Pulse (!) 107   Temp 98.6 ?F (37 ?C)   Resp 18   SpO2 98%  ?Gen:   Awake, no distress   ?Resp:  Normal effort  ?MSK:   Moves extremities without difficulty  ?Other:   ? ?Medical Decision Making  ?Medically screening exam initiated at 9:33 PM.  Appropriate orders placed.  Bard Haupert was informed that the remainder of the evaluation will be completed by another provider, this initial triage assessment does not replace that evaluation, and the importance of remaining in the ED until their evaluation is complete. ? ? ?  ?Marita Kansas, PA-C ?09/20/21 2134 ? ?

## 2021-09-20 NOTE — ED Triage Notes (Signed)
Pt John Copeland ROUTINE: Pt presents to [facility] seeking a pharmacy change for his medication. Pt states he has a difficult time getting to the pharmacy that was listed, so he would like for his medication to be sent to [facility] for easier access. Pt seen my provider in triage. Pt denies SI/HI, paranoia, NSSIB and AVH. ?

## 2021-09-20 NOTE — Discharge Instructions (Addendum)
For your primary care needs, please come to [facility]. ? ?[facility] ?[address] ?[place], [place] [address] ?Phone number: [phone] ? ?For your medication management and counseling needs, please come to [facility] (this facility, SECOND FLOOR) during walk in hours for appointment with psychiatrist/provider for further medication management and for therapists for therapy.  ? ?Walk-Ins for medication management  are available on Monday, Wednesday, Thursday and Friday from 8am-11am.  It is first come, first -serve; it is best to arrive by 7:00 AM.  ? ?Walk-Ins for therapy are  available on Monday and Wednesday?s  8am-11am.  It is first come, first -serve; it is best to arrive by 7:00 AM.  ? ?When you arrive please go upstairs for your appointment. If you are unsure of where to go, inform the front desk that you are here for a walk in appointment and they will assist you with directions upstairs. ? ?Address:  ?[address] in [place], [address] ?Ph: [phone]  ?

## 2021-09-20 NOTE — ED Provider Notes (Signed)
Behavioral Health Urgent Care Medical Screening Exam ? ?Patient Name: John Copeland ?MRN: [id number] ?Date of Evaluation: 09/20/21 ?Chief Complaint:   ?Diagnosis:  ?Final diagnoses:  ?Need for community resource  ? ?History of Present illness: John Copeland is a 31 y.o. male. Pt presents voluntarily to [facility] behavioral health for walk-in assessment.  Pt is assessed face-to-face by nurse practitioner.  ? ?Pt w/ hx of schizoaffective disorder, adhd. ? ?Pt reports he was recently at [facility] and rx'd medications. Rx were sent to [facility] and pt would like for medications to be sent to [facility]. Per chart review, pt was at [facility] from 09/16/21-09/18/21, transferred from [facility] following SA (intentional OD of 600mg  of dextromethorphan). Pt was discharged w/ rx for amlodipine 5mg  and aripiprazole 10mg .  ? ?Pt reports "good" euthymic mood. Reports good appetite and sleep. Denies SI/VI/HI. Denies AVH, paranoia, delusions. Reports hx of multiple SAs and multiple inpatient psychiatric hospitalizations. Denies hx of NSSI. Denies current counseling or psychiatric medication management services. Reports family psychiatric hx is positive, his mother was at behavioral health hospital, although denies knowledge of dx. Denies current SU. Denies access to a firearm. Reports he is currently living with friends.  ? ?Safety planning completed w/ pt, pt should go to the nearest emergency room or call 911/EMS for worsening condition. Pt verbally contracts to safety. Pt is future oriented, states he is looking forward to going back to school.  ? ?Pt states he has a legal guardian, . Pt gave verbal consent for nurse practitioner to contact , and provided phone number, [phone]. [name] states she is working w/ pt on obtaining housing. Discussed pt will be discharged w/ resources for outpatient follow up. [name] verbalized understanding.  ? ?Discussed w/ pt he could contact pharmacy and request transfer of rx.  Discussed importance of outpatient follow up to ensure continued access to medications. Discussed resources available to patient, including [facility] for pt's primary care needs and the outpatient clinic at [facility] for pt's psychiatric/counseling needs. Pt verbalized understanding. Pt discharged w/ 7 day supply of amlodipine 5mg  and aripiprazole 10mg . ? ?Pt is a&ox3. Pt dressed in a religious robe, states his friend purchased it for him. Eye contact is fair. Speech is clear and coherent, w/ nml rate and volume. Reported mood is euthymic and affect is congruent. TP is coherent, goal directed. Descriptions of associations is intact. TC is logical. There is no evidence of internal preoccupation, agitation, aggression, or distractibility. No paranoia or delusions elicited. Pt is calm, cooperative, pleasant.  ? ?Psychiatric Specialty Exam ? ?Presentation  ?General Appearance:Other (comment) (Pt dressed in a religious robe, states his friend purchased it for him) ? ?Eye Contact:Fair ? ?Speech:Clear and Coherent; Normal Rate ? ?Speech Volume:Normal ? ?Handedness:Right ? ?Mood and Affect  ?Mood:Euthymic ? ?Affect:Congruent ? ?Thought Process  ?Thought Processes:Coherent; Goal Directed ? ?Descriptions of Associations:Intact ? ?Orientation:Full (Time, Place and Person) ? ?Thought Content:Logical ? Diagnosis of Schizophrenia or Schizoaffective disorder in past: Yes ?  Hallucinations:None ? ?Ideas of Reference:None ? ?Suicidal Thoughts:No ? ?Homicidal Thoughts:No ? ?Sensorium  ?Memory:Immediate Good ? ?Judgment:Fair ? ?Insight:Fair ? ?Executive Functions  ?Concentration:Fair ? ?Attention Span:Fair ? ?Recall:Fair ? ?Fund of Knowledge:Fair ? ?Language:Fair ? ?Psychomotor Activity  ?Psychomotor Activity:Normal ? ?Assets  ?Assets:Communication Skills; Desire for Improvement; Housing ? ?Sleep  ?Sleep:Good ? ?Number of hours:  ? ?No data recorded ? ?Physical  Exam: ?Physical Exam ?Pulmonary:  ?   Effort: Pulmonary effort is normal.  ?  Neurological:  ?   Mental Status: He is alert and oriented to person, place, and time.  ?Psychiatric:     ?   Attention and Perception: Attention and perception normal.     ?   Mood and Affect: Mood and affect normal.     ?   Speech: Speech normal.     ?   Behavior: Behavior normal. Behavior is cooperative.     ?   Thought Content: Thought content normal.     ?   Cognition and Memory: Cognition and memory normal.     ?   Judgment: Judgment normal.  ? ?Review of Systems  ?Constitutional:  Negative for chills and fever.  ?Respiratory:  Negative for shortness of breath.   ?Cardiovascular:  Negative for chest pain and palpitations.  ?Gastrointestinal:  Negative for abdominal pain.  ?Psychiatric/Behavioral: Negative.    ?Blood pressure 136/89, pulse (!) 102, temperature 98.6 ?F (37 ?C), temperature source Oral, resp. rate 18, SpO2 99 %. There is no height or weight on file to calculate BMI. ? ?Musculoskeletal: ?Strength & Muscle Tone: within normal limits ?Gait & Station: normal ?Patient leans: N/A ? ?[facility] MSE Discharge Disposition for Follow up and Recommendations: ?Based on my evaluation the patient does not appear to have an emergency medical condition and can be discharged with resources and follow up care in outpatient services for Medication Management and Individual Therapy ? ?Lauree Chandler, NP ?09/20/2021, 3:47 PM ?

## 2021-09-21 ENCOUNTER — Emergency Department (EMERGENCY_DEPARTMENT_HOSPITAL)
Admission: EM | Admit: 2021-09-21 | Discharge: 2021-09-23 | Disposition: A | Discharge status: Home / Self Care | Payer: Medicaid Other | Source: Home / Self Care | Attending: Emergency Medicine | Admitting: Emergency Medicine

## 2021-09-21 ENCOUNTER — Other Ambulatory Visit: Payer: Self-pay

## 2021-09-21 ENCOUNTER — Ambulatory Visit (HOSPITAL_COMMUNITY)
Admission: EM | Admit: 2021-09-21 | Discharge: 2021-09-21 | Disposition: A | Discharge status: Home / Self Care | Payer: Medicaid Other | Source: Home / Self Care

## 2021-09-21 ENCOUNTER — Encounter (HOSPITAL_COMMUNITY): Payer: Self-pay

## 2021-09-21 DIAGNOSIS — Z760 Encounter for issue of repeat prescription: Secondary | ICD-10-CM | POA: Insufficient documentation

## 2021-09-21 DIAGNOSIS — F250 Schizoaffective disorder, bipolar type: Secondary | ICD-10-CM | POA: Insufficient documentation

## 2021-09-21 DIAGNOSIS — Z20822 Contact with and (suspected) exposure to covid-19: Secondary | ICD-10-CM | POA: Insufficient documentation

## 2021-09-21 DIAGNOSIS — I10 Essential (primary) hypertension: Secondary | ICD-10-CM | POA: Insufficient documentation

## 2021-09-21 DIAGNOSIS — Z79899 Other long term (current) drug therapy: Secondary | ICD-10-CM | POA: Insufficient documentation

## 2021-09-21 DIAGNOSIS — Z5900 Homelessness unspecified: Secondary | ICD-10-CM | POA: Insufficient documentation

## 2021-09-21 DIAGNOSIS — R45851 Suicidal ideations: Secondary | ICD-10-CM | POA: Insufficient documentation

## 2021-09-21 LAB — HEPATIC FUNCTION PANEL
ALT: 38 U/L (ref 0–44)
AST: 47 U/L — ABNORMAL HIGH (ref 15–41)
Albumin: 3.9 g/dL (ref 3.5–5.0)
Alkaline Phosphatase: 39 U/L (ref 38–126)
Bilirubin, Direct: 0.1 mg/dL (ref 0.0–0.2)
Indirect Bilirubin: 0.6 mg/dL (ref 0.3–0.9)
Total Bilirubin: 0.7 mg/dL (ref 0.3–1.2)
Total Protein: 6.6 g/dL (ref 6.5–8.1)

## 2021-09-21 MED ORDER — ARIPIPRAZOLE 10 MG PO TABS: 10.0000 mg | ORAL_TABLET | Freq: Every day | ORAL | Status: DC

## 2021-09-21 MED ORDER — AMLODIPINE BESYLATE 5 MG PO TABS
5.0000 mg | ORAL_TABLET | Freq: Every day | ORAL | Status: DC
  Administered 2021-09-22: 5 mg via ORAL
  Filled 2021-09-21 (×2): qty 1

## 2021-09-21 MED ORDER — ARIPIPRAZOLE 10 MG PO TABS
10.0000 mg | ORAL_TABLET | Freq: Every day | ORAL | Status: DC
  Administered 2021-09-21 – 2021-09-23 (×3): 10 mg via ORAL
  Filled 2021-09-21 (×4): qty 1

## 2021-09-21 NOTE — ED Notes (Signed)
TTS in progress 

## 2021-09-21 NOTE — ED Provider Notes (Signed)
?[name] EMERGENCY DEPARTMENT ?Provider Note ? ? ?CSN: [id number] ?Arrival date & time: 09/20/21  2053 ? ?  ? ?History ? ?Chief Complaint  ?Patient presents with  ? Ingestion  ? ? ?John Copeland is a 31 y.o. male. ? ?The history is provided by the patient and medical records.  ?Ingestion ?John Copeland is a 31 y.o. male who presents to the Emergency Department complaining of ingestion.  He presents to the ED voluntarily for evaluation following overdose on acetaminophen, 7 tablets just prior to ED arrival.  Pt then stated after his initial statement that he did not actually take any medications and he presented to the emergency department because he does not have a place to stay.  He was staying in an apartment but his roommate kicked him out because he needed to bring back money.  He was told he needed to [place] and he did not want to [place].  He states he gets angry at times but is working hard to control his anger.  He denies any SI, HI.  He is compliant with his prescribed medications.  He has a history of hypertension and schizoaffective disorder. ? ?  ? ?Home Medications ?Prior to Admission medications   ?Medication Sig Start Date End Date Taking? Authorizing Provider  ?amLODipine (NORVASC) 5 MG tablet Take 1 tablet (5 mg total) by mouth daily. 09/18/21   Lauro Franklin, MD  ?ARIPiprazole (ABILIFY) 10 MG tablet Take 1 tablet (10 mg total) by mouth daily. 09/19/21 10/19/21  Lauro Franklin, MD  ?atomoxetine (STRATTERA) 25 MG capsule Take 25 mg by mouth daily.    [provider]  ?buPROPion (WELLBUTRIN XL) 150 MG 24 hr tablet Take 150 mg by mouth daily.    [provider]  ?   ? ?Allergies    ?Risperdal [risperidone]   ? ?Review of Systems   ?Review of Systems  ?All other systems reviewed and are negative. ? ?Physical Exam ?Updated Vital Signs ?BP (!) 149/90   Pulse 89   Temp 98.6 ?F (37 ?C)   Resp 16   SpO2 98%  ?Physical Exam ?Vitals and  nursing note reviewed.  ?Constitutional:   ?   Appearance: He is well-developed.  ?HENT:  ?   Head: Normocephalic and atraumatic.  ?Cardiovascular:  ?   Rate and Rhythm: Normal rate and regular rhythm.  ?Pulmonary:  ?   Effort: Pulmonary effort is normal. No respiratory distress.  ?Abdominal:  ?   Palpations: Abdomen is soft.  ?   Tenderness: There is no abdominal tenderness. There is no guarding or rebound.  ?Musculoskeletal:     ?   General: No tenderness.  ?Skin: ?   General: Skin is warm and dry.  ?Neurological:  ?   Mental Status: He is alert and oriented to person, place, and time.  ?Psychiatric:  ?   Comments: Tangential with labile mood.  No active SI, HI  ? ? ?ED Results / Procedures / Treatments   ?Labs ?(all labs ordered are listed, but only abnormal results are displayed) ?Labs Reviewed  ?CBC WITH DIFFERENTIAL/PLATELET - Abnormal; Notable for the following components:  ?    Result Value  ? Hemoglobin 12.1 (*)   ? MCV 75.1 (*)   ? MCH 23.3 (*)   ? RDW 15.8 (*)   ? All other components within normal limits  ?ACETAMINOPHEN LEVEL - Abnormal; Notable for the following components:  ? Acetaminophen (Tylenol), Serum <10 (*)   ?  All other components within normal limits  ?HEPATIC FUNCTION PANEL - Abnormal; Notable for the following components:  ? AST 47 (*)   ? All other components within normal limits  ?BASIC METABOLIC PANEL  ? ? ?EKG ?None ? ?Radiology ?No results found. ? ?Procedures ?Procedures  ? ? ?Medications Ordered in ED ?Medications - No data to display ? ?ED Course/ Medical Decision Making/ A&P ?  ?                        ?Medical Decision Making ? ?Patient here for evaluation of initial complaint of accidental overdose.  On reassessment patient denies overdose and states that he is here due to not having a place to stay.  He is disorganized with labile mood but not actively suicidal or homicidal.  His acetaminophen level is normal.  He was seen just prior to this visit at behavioral health urgent care  with housing insecurity.  Suspect that he did not truly overdose but is here due to housing issues.  Attempted to reach patient's guardian, no answer but a voicemail was left.  Case management consult placed regarding housing resources.  Patient is medically stable for outpatient behavioral health and PCP follow-up. ? ? ? ? ? ? ? ?Final Clinical Impression(s) / ED Diagnoses ?Final diagnoses:  ?Housing insecurity  ? ? ?Rx / DC Orders ?ED Discharge Orders   ? ? None  ? ?  ? ? ?  ?Tilden Fossa, MD ?09/21/21 0700 ? ?

## 2021-09-21 NOTE — Progress Notes (Signed)
Patient presents to the [facility] voluntarily with the police stating that he needs to get medications that will help him think like a normal person.  He states that his current medications are not working.  He states that he has decreased concentration, he carries on conversations with himself and states that he experiences extreme elation and is energized one minute and then is competely lazy and feels worthless. He states that he is never able to complete tasks and states that heexperiences severe anxiety and it is hard for him to face the world.  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  He states that he has been on psychmedications since he was 31 years old and nothing has really helped him.  He denies SI/HI/AVH. Patient is routine. ?

## 2021-09-21 NOTE — ED Provider Notes (Signed)
?  [facility]-EMERGENCY DEPT ?Provider Note ? ? ?CSN: [id number] ?Arrival date & time: 09/21/21  2155 ? ?  ? ?History ? ?Chief Complaint  ?Patient presents with  ? Suicidal  ? ? ?John Copeland is a 31 y.o. male. ? ?HPI ?Patient presents suicidal.  Had been seen yesterday in the ER and then earlier today at behavioral health urgent care.  Reviewing notes it is does not appear as if he was suicidal at the urgent care.  States that he did not tell them about being suicidal.  States he plans to overdose.  Denies substance use.  States he is feeling bad. ?  ? ?Home Medications ?Prior to Admission medications   ?Medication Sig Start Date End Date Taking? Authorizing Provider  ?amLODipine (NORVASC) 5 MG tablet Take 1 tablet (5 mg total) by mouth daily. 09/18/21   Lauro Franklin, MD  ?ARIPiprazole (ABILIFY) 10 MG tablet Take 1 tablet (10 mg total) by mouth daily. 09/19/21 10/19/21  Lauro Franklin, MD  ?atomoxetine (STRATTERA) 25 MG capsule Take 25 mg by mouth daily.    [provider]  ?buPROPion (WELLBUTRIN XL) 150 MG 24 hr tablet Take 150 mg by mouth daily.    [provider]  ?   ? ?Allergies    ?Risperdal [risperidone]   ? ?Review of Systems   ?Review of Systems ? ?Physical Exam ?Updated Vital Signs ?BP (!) 148/76   Pulse 77   Temp 98.2 ?F (36.8 ?C)   Resp 18   SpO2 96%  ?Physical Exam ?Vitals and nursing note reviewed.  ?Cardiovascular:  ?   Rate and Rhythm: Regular rhythm.  ?Abdominal:  ?   Tenderness: There is no abdominal tenderness.  ?Musculoskeletal:  ?   Cervical back: Neck supple.  ?Neurological:  ?   Mental Status: He is alert.  ?Psychiatric:  ?   Comments: Somewhat strange affect.  ? ? ?ED Results / Procedures / Treatments   ?Labs ?(all labs ordered are listed, but only abnormal results are displayed) ?Labs Reviewed  ?RESP PANEL BY RT-PCR (FLU A&B, COVID) ARPGX2  ? ? ?EKG ?None ? ?Radiology ?No results found. ? ?Procedures ?Procedures  ? ? ?Medications  Ordered in ED ?Medications  ?amLODipine (NORVASC) tablet 5 mg (has no administration in time range)  ?ARIPiprazole (ABILIFY) tablet 10 mg (has no administration in time range)  ? ? ?ED Course/ Medical Decision Making/ A&P ?  ?                        ?Medical Decision Making ?Risk ?Prescription drug management. ? ? ?Patient with multiple visits to the ER for various psychiatric complaints.  Seen at behavioral health urgent care earlier today and was discharged with plans of following up tomorrow morning for medication adjustment.  Patient states he is suicidal.  States he plans to overdose.  States he did not tell behavioral health earlier today about it.  Patient's lab work has been reviewed.  Patient is medically cleared.  We will have patient seen by TTS.  Patient states he is not suicidal I believe he can leave. ? ? ? ? ? ? ? ?Final Clinical Impression(s) / ED Diagnoses ?Final diagnoses:  ?Suicidal ideation  ? ? ?Rx / DC Orders ?ED Discharge Orders   ? ? None  ? ?  ? ? ?  ?Benjiman Core, MD ?09/21/21 2300 ? ?

## 2021-09-21 NOTE — ED Provider Notes (Signed)
Behavioral Health Urgent Care Medical Screening Exam ? ?Patient Name: John Copeland ?MRN: [id number] ?Date of Evaluation: 09/21/21 ?Chief Complaint:   ?Diagnosis:  ?Final diagnoses:  ?Medication management  ? ? ?History of Present illness: John Copeland is a 31 y.o. male. Patient presents today with [employer].  He reports that earlier this morning he was walking around [facility] and because he did not have a Pensions consultant he asked them to call the police to bring him here.   ? ?He reports that he wants help with medication management.  He reports that he has been on medications since he was 13 but that they have never helped him.  He reports that thi sis because he never fully told the doctors what was going on because he could not put everything into words.  He states he wants medications to help him "be normal, think like normal people."  He reports he cannot concentrate or get things done.  He reports he sometimes has too much energy and other times he is too lazy to do anything.  He reports occasional EtOH use but also reports he is abusing OTC Cold medicine.  He reports he needs the medicine to be confident to be able to do things but reports he plans to stop using it.  Discussed with him that we cannot do medication management here.  Confirmed if he still has the samples provided to him yesterday and he reports he does.   ? ?He reports no SI, HI, or AVH.  He reports no other concerns at present. ? ?Discussed with him that we would provide him with a bus pass and recommended he go to the [facility] because they would have more resources.  Discussed the importance of returning tomorrow morning at 7 AM so he can be seen in the walk in clinic to establish and get medication management. ? ? ?Called patient's legal guardian [name], [phone].  Discussed we would be discharging John Copeland and recommending he retunr tomorrow to walk in hours.  He reported he hoped that [name] would do so as well.  He  reports that struggle he is currently having to get John Copeland a new residence.  He reports due to his aggression he needs a higher level of care and needs a new FL2 form.  Discussed that we are recommending he got to the [facility] and that they would be better able to assist John Copeland and they might be able to assist with the FL2 form.  He had no other concerns at present. ? ? ?Psychiatric Specialty Exam ? ?Presentation  ?General Appearance:Casual (in religious robe) ? ?Eye Contact:Good ? ?Speech:Clear and Coherent; Normal Rate ? ?Speech Volume:Normal ? ?Handedness:Right ? ? ?Mood and Affect  ?Mood:Euthymic ? ?Affect:Congruent ? ? ?Thought Process  ?Thought Processes:Coherent; Goal Directed ? ?Descriptions of Associations:Intact ? ?Orientation:Full (Time, Place and Person) ? ?Thought Content:Logical ?No SI, HI, or AVH. ? ?Diagnosis of Schizophrenia or Schizoaffective disorder in past: Yes ?  Hallucinations:None ? ?Ideas of Reference:None ? ?Suicidal Thoughts:No ?Without Intent; Without Plan ? ?Homicidal Thoughts:No ? ? ?Sensorium  ?Memory:Immediate Good; Recent Good ? ?Judgment:Fair ? ?Insight:Fair ? ? ?Executive Functions  ?Concentration:Fair ? ?Attention Span:Fair ? ?Recall:Fair ? ?[place] ? ?Language:Fair ? ? ?Psychomotor Activity  ?Psychomotor Activity:Normal ? ? ?Assets  ?Assets:Communication Skills; Desire for Improvement ? ? ?Sleep  ?Sleep:Good ? ? ? ?Physical Exam: ?Physical Exam ?Vitals and nursing note reviewed.  ?Constitutional:   ?   General: He is not in acute  distress. ?   Appearance: Normal appearance. He is normal weight. He is not ill-appearing or toxic-appearing.  ?HENT:  ?   Head: Normocephalic and atraumatic.  ?Pulmonary:  ?   Effort: Pulmonary effort is normal.  ?Musculoskeletal:     ?   General: Normal range of motion.  ?Neurological:  ?   General: No focal deficit present.  ?   Mental Status: He is alert.  ? ?Review of Systems  ?Respiratory:  Negative for cough and shortness of breath.    ?Cardiovascular:  Negative for chest pain.  ?Gastrointestinal:  Negative for abdominal pain, constipation, diarrhea, nausea and vomiting.  ?Neurological:  Negative for dizziness, weakness and headaches.  ?Psychiatric/Behavioral:  Negative for depression, hallucinations and suicidal ideas. The patient is not nervous/anxious.   ?Blood pressure 138/84, pulse 88, temperature 98 ?F (36.7 ?C), temperature source Oral, resp. rate 18, SpO2 99 %. There is no height or weight on file to calculate BMI. ? ?Musculoskeletal: ?Strength & Muscle Tone: within normal limits ?Gait & Station: normal ?Patient leans: N/A ? ? ?[facility] MSE Discharge Disposition for Follow up and Recommendations: ?Based on my evaluation the patient does not appear to have an emergency medical condition and can be discharged. ? ? ?Please come to [facility] (this facility) during walk in hours for appointment with psychiatrist/provider for further medication management and for therapists for therapy.  ? ? Walk-Ins for medication management  are available on Monday, Wednesday, Thursday and Friday from 8am-11am.  It is first come, first -serve; it is best to arrive by 7:00 AM.  ? ? Walk-Ins for therapy are  available on Monday and Wednesday?s  8am-11am.  It is first come, first -serve; it is best to arrive by 7:00 AM.  ? ? ?When you arrive please go upstairs for your appointment. If you are unsure of where to go, inform the front desk that you are here for a walk in appointment and they will assist you with directions upstairs. ? ?Address:  ?[address] in [place], [place] ?Ph: [phone]  ? ? ? ?Briant Cedar, MD ?09/21/2021, 11:59 AM ? ?

## 2021-09-21 NOTE — ED Notes (Signed)
Patient discharged received  an after visit summary with follow up instructions  and community resources. Patient denies SI, HI and AVH at time of discharge. All belongings returned. ?

## 2021-09-21 NOTE — ED Notes (Signed)
Edp at bedside °

## 2021-09-21 NOTE — ED Triage Notes (Signed)
Pt states that he is depressed and suicidal since earlier today.  ?

## 2021-09-21 NOTE — ED Notes (Signed)
Pt up and down throughout entire stay - refused DC vitals  ?

## 2021-09-21 NOTE — ED Notes (Signed)
Pt has one belonging bag. Pt belonging bag place in triage nursing station. ?

## 2021-09-21 NOTE — ED Notes (Signed)
Pt belongings bag x1 placed in 9-25 cabinet behind nurse station ?

## 2021-09-21 NOTE — ED Notes (Signed)
Patient verbalizes understanding of discharge instructions. Opportunity for questioning and answers were provided. Armband removed by staff, pt discharged from ED ambulatory.   

## 2021-09-21 NOTE — Discharge Instructions (Addendum)
?  Please come to [facility] (this facility) during walk in hours for appointment with psychiatrist/provider for further medication management and for therapists for therapy.  ? ? Walk-Ins for medication management  are available on Monday, Wednesday, Thursday and Friday from 8am-11am.  It is first come, first -serve; it is best to arrive by 7:00 AM.  ? ? Walk-Ins for therapy are  available on Monday and Wednesday?s  8am-11am.  It is first come, first -serve; it is best to arrive by 7:00 AM.  ? ? ?When you arrive please go upstairs for your appointment. If you are unsure of where to go, inform the front desk that you are here for a walk in appointment and they will assist you with directions upstairs. ? ?Address:  ?[address], in [place], [address] ?Ph: [phone]  ? ? ? ? ? ?

## 2021-09-21 NOTE — ED Notes (Signed)
Pt's belongings removed, pt wanded by security.  ?

## 2021-09-22 LAB — RESP PANEL BY RT-PCR (FLU A&B, COVID) ARPGX2
Influenza A by PCR: NEGATIVE
Influenza B by PCR: NEGATIVE
SARS Coronavirus 2 by RT PCR: NEGATIVE

## 2021-09-22 NOTE — BH Assessment (Signed)
Per guardian, if you need to get in touch with guardian after 5pm please call after hours number [phone] ?

## 2021-09-22 NOTE — ED Provider Notes (Signed)
Emergency Medicine Observation Re-evaluation Note ? ?John Copeland is a 31 y.o. male, seen on rounds today.  Pt initially presented to the ED for complaints of Suicidal ?Currently, the patient is resting. ? ?Physical Exam  ?BP (!) 139/95   Pulse (!) 55   Temp 98.2 ?F (36.8 ?C)   Resp 16   SpO2 99%  ?Physical Exam ?General: calm, no distress ?Cardiac: well perfused ?Lungs: even and unlabored ?Psych: calm ? ?ED Course / MDM  ?EKG:  ? ?I have reviewed the labs performed to date as well as medications administered while in observation.  Recent changes in the last 24 hours include no acute changes, psych rec overnight observation. ? ?Plan  ?Current plan is for reassement by psch this am. ? Keifer Habib is not under involuntary commitment. ? ? ?  ?Milagros Loll, MD ?09/22/21 0815 ? ?
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
## 2021-09-23 DIAGNOSIS — F250 Schizoaffective disorder, bipolar type: Secondary | ICD-10-CM

## 2021-09-23 MED ORDER — DIVALPROEX SODIUM ER 500 MG PO TB24: 500.0000 mg | ORAL_TABLET | Freq: Every day | ORAL | Status: DC

## 2021-09-23 MED ORDER — ZIPRASIDONE MESYLATE 20 MG IM SOLR
20.0000 mg | Freq: Once | INTRAMUSCULAR | Status: AC
  Filled 2021-09-23: qty 20

## 2021-09-23 MED ORDER — ZIPRASIDONE MESYLATE 20 MG IM SOLR
INTRAMUSCULAR | Status: AC
  Administered 2021-09-23: 20 mg via INTRAMUSCULAR
  Filled 2021-09-23: qty 20

## 2021-09-23 MED ORDER — STERILE WATER FOR INJECTION IJ SOLN
INTRAMUSCULAR | Status: AC
  Administered 2021-09-23: 10 mL
  Filled 2021-09-23: qty 10

## 2021-09-23 NOTE — Progress Notes (Signed)
Shelter List: ?  ?[facility] ([place]) ?[place], [place] ?Phone: [phone] ?  ?[facility] ?[employer] has been providing emergency shelter to those in need of a permanent residence for over 35 years. The [facility] shelter plays an important role in our community. ?  ?There are many life events that can pull someone into a downward spiral towards poverty that is very difficult to get out of. Homelessness is a problem that can affect anyone of [place]. [facility] is a safe and comforting place to stay, especially if you have experienced the hardship of street life. ?  ?[facility] provides a single bed and bedding to 100 adult men and women. The shelter welcomes all who are in need of housing, no one in real need is turned away unless space is not available. ?  ?While staying at [facility], guests are offered more than just a bed for a night. Hot meals are provided and every guest has access to case management services. Case managers provide assistance with finding housing, employment, or other services that will help them gain stability. Continuous stay is based on availability, capacity, and progress towards goals. ?  ?To contact the front desk of [facility] please call ?  ?[phone] ext [phone] or ext. [phone]. ?  ?[facility] ?[address] [place], [place] [address] ?Phone: [phone] ?  ?[facility] (Women only) ?[address], [place], [place] [address] ?Phone: [phone] ?  ?[facility] ?[place], [place] [address] ?Phone: [phone] ?  ?[facility]: ?[address] ?[place], [place] [address] ?Phone: [phone] ?  ?[facility] ?[address] [place], [place] [address] ?Check in at 6:00PM for placement at a local shelter) ?Phone: [phone] ?  ?  ?[facility]          -(Please Call) ?Phone: [phone] ?  ?[facility] ?Eligibility:  ?Must be drug and alcohol free for at least 14 days or more at the time of application. ?This program serves males. ? Houses Architectural technologist, Futures trader who serve six-month terms. ? Houses are financially self-supporting; members split house expenses, which average $90.00 to $130.00 per person per week. ? Any Resident who relapses must be immediately expelled. ?Call:  [phone] ? ? ?[place]ie [place], MSW, LCSW-A, LCAS ?Phone: [phone] ?Disposition/TOC ? ?

## 2021-09-23 NOTE — Discharge Instructions (Signed)
Shelter List:   [facility] ([facility]) [address] [place], [place] Phone: [phone]   [facility] has been providing emergency shelter to those in need of a permanent residence for over 35 years. The [facility] shelter plays an important role in our community.   There are many life events that can pull someone into a downward spiral towards poverty that is very difficult to get out of. Homelessness is a problem that can affect anyone of us. [facility] is a safe and comforting place to stay, especially if you have experienced the hardship of street life.   [facility] provides a single bed and bedding to 100 adult men and women. The shelter welcomes all who are in need of housing, no one in real need is turned away unless space is not available.   While staying at [facility], guests are offered more than just a bed for a night. Hot meals are provided and every guest has access to case management services. Case managers provide assistance with finding housing, employment, or other services that will help them gain stability. Continuous stay is based on availability, capacity, and progress towards goals.   To contact the front desk of [facility] please call   [phone] ext [phone] or ext. [phone].   [facility] [address], [place], [place] [address] Phone: [phone]   [facility] (Women only) [address], [place], [place] [address] Phone: [phone]   [facility] [address] [place], [place] [address] Phone: [phone]   [facility]: [address] [place], [place] [address] Phone: [phone]   [facility] [address], [place], [place] [address] Check in at 6:00PM for placement at a local shelter) Phone: [phone]     [facility]          -(Please Call) Phone: [phone]   [facility] Eligibility:  Must be drug and alcohol free for at least 14 days or more at the time of application. This program serves males.  Houses operate democratically, electing house officers who serve six-month terms.  Houses are financially self-supporting; members split house expenses, which average $90.00 to $130.00 per person per week.  Any Resident who relapses must be immediately expelled. Call:  [phone]  

## 2021-09-23 NOTE — ED Notes (Signed)
Pt just got a sandwiches from security.pt been eating lots of food and milks ?
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
## 2021-09-23 NOTE — ED Provider Notes (Addendum)
Emergency Medicine Observation Re-evaluation Note ? ?John Copeland is a 31 y.o. male, seen on rounds today.  Pt initially presented to the ED for complaints of Suicidal ?Currently, the patient is being evaluated by behavioral health.  They are planning to evaluate him again in the morning they are seeing him now.  Patient still expressing suicidal ideation.  Patient is not IVC.  Patient got aggravated this morning because he wanted a snack.  And they would not give him that.  But he is not wanting to leave.  He is willing to stay.  Behavioral health reevaluating now. ? ?Physical Exam  ?BP 133/74 (BP Location: Right Arm)   Pulse (!) 57   Temp 97.7 ?F (36.5 ?C) (Oral)   Resp 19   SpO2 100%  ?Physical Exam ?Constitutional:   ?   General: He is not in acute distress. ?   Comments: Patient was recently aggravated but he has settled down.  ?Cardiovascular:  ?   Rate and Rhythm: Normal rate.  ?Pulmonary:  ?   Effort: No respiratory distress.  ?Musculoskeletal:     ?   General: No swelling.  ?Neurological:  ?   General: No focal deficit present.  ?   Mental Status: He is alert. Mental status is at baseline.  ? ? ? ?ED Course / MDM  ?EKG:  ? ?I have reviewed the labs performed to date as well as medications administered while in observation.  Recent changes in the last 24 hours include as stated above patient got aggravated this morning.  But has now settled down.  Behavioral health is reevaluating.. ? ?Plan  ?Current plan is for behavioral health reevaluation's morning which they are doing now.  Patient still expressing suicidal ideation. ? John Copeland is not under involuntary commitment. ? ? ?  ?Vanetta Mulders, MD ?09/23/21 [phone] ? ?Patient getting agitated again.  We will go ahead and IVC him at the request of behavioral health and we will give him Geodon. ? ?  ?Vanetta Mulders, MD ?09/23/21 1007 ? ?

## 2021-09-23 NOTE — ED Provider Notes (Signed)
Patient cleared for discharge by psychiatry ?  ?Lorre Nick, MD ?09/23/21 1736 ? ?

## 2021-09-23 NOTE — Progress Notes (Signed)
09/23/2021  1513  Patient c/o right foot pain. ?

## 2021-09-23 NOTE — ED Notes (Signed)
Patient is uncooperative verbally abusing staff and using threats combative ?

## 2021-09-25 ENCOUNTER — Other Ambulatory Visit: Payer: Self-pay

## 2021-09-25 ENCOUNTER — Encounter (HOSPITAL_COMMUNITY): Payer: Self-pay | Admitting: Emergency Medicine

## 2021-09-25 ENCOUNTER — Emergency Department (HOSPITAL_COMMUNITY)
Admission: EM | Admit: 2021-09-25 | Discharge: 2021-09-25 | Disposition: A | Discharge status: Home / Self Care | Payer: Medicaid Other | Attending: Emergency Medicine | Admitting: Emergency Medicine

## 2021-09-25 DIAGNOSIS — G4700 Insomnia, unspecified: Secondary | ICD-10-CM | POA: Diagnosis not present

## 2021-09-25 DIAGNOSIS — I10 Essential (primary) hypertension: Secondary | ICD-10-CM | POA: Diagnosis not present

## 2021-09-25 DIAGNOSIS — F419 Anxiety disorder, unspecified: Secondary | ICD-10-CM | POA: Insufficient documentation

## 2021-09-25 DIAGNOSIS — Z79899 Other long term (current) drug therapy: Secondary | ICD-10-CM | POA: Diagnosis not present

## 2021-09-25 MED ORDER — HYDROXYZINE HCL 25 MG PO TABS: 25.0000 mg | ORAL_TABLET | Freq: Every day | ORAL | 0 refills | Status: DC

## 2021-09-25 NOTE — Discharge Instructions (Addendum)
Try hydroxyzine as prescribed for sleep. ?Follow up with Behavioral Health Team or [facility].  ?

## 2021-09-25 NOTE — ED Triage Notes (Signed)
Pt reports not being able to sleep. Wants medication  ?

## 2021-09-25 NOTE — ED Provider Notes (Signed)
?[facility] DEPT ?Provider Note ? ? ?CSN: [id number] ?Arrival date & time: 09/25/21  1719 ? ?  ? ?History ? ?No chief complaint on file. ? ? ?John Copeland is a 31 y.o. male. ? ?31 year old male with past medical history depression, diabetes, schizophrenia, anxiety, homelessness and hypertension presents with complaint of inability to sleep for the past few days.  Patient reports general medication noncompliance.  States that the last time he was here he did not behave well and profusely apologizes.  He states that he is not suicidal or homicidal today, denies auditory visual hallucinations, denies drug or alcohol use.  Patient has not tried taking anything over-the-counter or otherwise to help him sleep.  No other complaints or concerns today. ? ? ?  ? ?Home Medications ?Prior to Admission medications   ?Medication Sig Start Date End Date Taking? Authorizing Provider  ?hydrOXYzine (ATARAX) 25 MG tablet Take 1 tablet (25 mg total) by mouth at bedtime. 09/25/21  Yes Tacy Learn, PA-C  ?amLODipine (NORVASC) 5 MG tablet Take 1 tablet (5 mg total) by mouth daily. ?Patient not taking: Reported on 09/22/2021 09/18/21   Briant Cedar, MD  ?ARIPiprazole (ABILIFY) 10 MG tablet Take 1 tablet (10 mg total) by mouth daily. ?Patient not taking: Reported on 09/22/2021 09/19/21 10/19/21  Briant Cedar, MD  ?atomoxetine (STRATTERA) 25 MG capsule Take 25 mg by mouth daily. ?Patient not taking: Reported on 09/22/2021    [provider]  ?buPROPion (WELLBUTRIN XL) 150 MG 24 hr tablet Take 150 mg by mouth daily. ?Patient not taking: Reported on 09/22/2021    [provider]  ?   ? ?Allergies    ?Risperdal [risperidone]   ? ?Review of Systems   ?Review of Systems ?Negative except as per HPI ?Physical Exam ?Updated Vital Signs ?BP (!) 155/102 (BP Location: Left Arm)   Pulse (!) 108   Temp 98.4 ?F (36.9 ?C) (Oral)   Resp 20   SpO2 99%  ?Physical Exam ?Vitals and nursing  note reviewed.  ?Constitutional:   ?   General: He is not in acute distress. ?   Appearance: He is well-developed. He is not diaphoretic.  ?HENT:  ?   Head: Normocephalic and atraumatic.  ?   Mouth/Throat:  ?   Mouth: Mucous membranes are moist.  ?Eyes:  ?   Pupils: Pupils are equal, round, and reactive to light.  ?Cardiovascular:  ?   Rate and Rhythm: Normal rate and regular rhythm.  ?   Heart sounds: Normal heart sounds.  ?Pulmonary:  ?   Effort: Pulmonary effort is normal.  ?   Breath sounds: Normal breath sounds.  ?Skin: ?   General: Skin is warm and dry.  ?   Findings: No erythema or rash.  ?Neurological:  ?   Mental Status: He is alert and oriented to person, place, and time.  ?   Gait: Gait normal.  ?Psychiatric:     ?   Mood and Affect: Mood is anxious. Affect is labile.     ?   Behavior: Behavior normal.     ?   Thought Content: Thought content is not paranoid. Thought content does not include homicidal or suicidal ideation.  ? ? ?ED Results / Procedures / Treatments   ?Labs ?(all labs ordered are listed, but only abnormal results are displayed) ?Labs Reviewed - No data to display ? ?EKG ?None ? ?Radiology ?No results found. ? ?Procedures ?Procedures  ? ? ?Medications Ordered in  ED ?Medications - No data to display ? ?ED Course/ Medical Decision Making/ A&P ?  ?                        ?Medical Decision Making ?Risk ?Prescription drug management. ? ? ?31 year old male with complaint as above requesting medical assistance with his insomnia.  He denies suicidal homicidal ideation today.  Plan is to prescribe hydroxyzine.  Encouraged to follow-up with his mental health team, also given information regarding behavioral health urgent care should he need further assistance. ? ? ? ? ? ? ? ?Final Clinical Impression(s) / ED Diagnoses ?Final diagnoses:  ?Insomnia, unspecified type  ? ? ?Rx / DC Orders ?ED Discharge Orders   ? ?      Ordered  ?  hydrOXYzine (ATARAX) 25 MG tablet  Daily at bedtime       ? 09/25/21 1737   ? ?  ?  ? ?  ? ? ?  ?Tacy Learn, PA-C ?09/25/21 2142 ? ?  ?Lacretia Leigh, MD ?09/27/21 [phone] ? ?

## 2021-09-27 ENCOUNTER — Encounter (HOSPITAL_COMMUNITY): Payer: Self-pay | Admitting: Emergency Medicine

## 2021-09-27 ENCOUNTER — Emergency Department (HOSPITAL_COMMUNITY)
Admission: EM | Admit: 2021-09-27 | Discharge: 2021-09-28 | Discharge status: Against Medical Advice | Payer: Medicaid Other | Attending: Emergency Medicine | Admitting: Emergency Medicine

## 2021-09-27 ENCOUNTER — Other Ambulatory Visit: Payer: Self-pay

## 2021-09-27 DIAGNOSIS — Z79899 Other long term (current) drug therapy: Secondary | ICD-10-CM | POA: Diagnosis not present

## 2021-09-27 DIAGNOSIS — E119 Type 2 diabetes mellitus without complications: Secondary | ICD-10-CM | POA: Diagnosis not present

## 2021-09-27 DIAGNOSIS — I10 Essential (primary) hypertension: Secondary | ICD-10-CM | POA: Insufficient documentation

## 2021-09-27 DIAGNOSIS — Z5900 Homelessness unspecified: Secondary | ICD-10-CM | POA: Insufficient documentation

## 2021-09-27 DIAGNOSIS — R5383 Other fatigue: Secondary | ICD-10-CM | POA: Insufficient documentation

## 2021-09-27 DIAGNOSIS — S0031XA Abrasion of nose, initial encounter: Secondary | ICD-10-CM | POA: Insufficient documentation

## 2021-09-27 DIAGNOSIS — F17210 Nicotine dependence, cigarettes, uncomplicated: Secondary | ICD-10-CM | POA: Insufficient documentation

## 2021-09-27 DIAGNOSIS — Z5181 Encounter for therapeutic drug level monitoring: Secondary | ICD-10-CM | POA: Insufficient documentation

## 2021-09-27 DIAGNOSIS — X58XXXA Exposure to other specified factors, initial encounter: Secondary | ICD-10-CM | POA: Diagnosis not present

## 2021-09-27 LAB — BASIC METABOLIC PANEL
Anion gap: 7 (ref 5–15)
BUN: 17 mg/dL (ref 6–20)
CO2: 23 mmol/L (ref 22–32)
Calcium: 9.7 mg/dL (ref 8.9–10.3)
Chloride: 105 mmol/L (ref 98–111)
Creatinine, Ser: 1.1 mg/dL (ref 0.61–1.24)
GFR, Estimated: >60 mL/min (ref >60)
Glucose, Bld: 90 mg/dL (ref 70–99)
Potassium: 4.3 mmol/L (ref 3.5–5.1)
Sodium: 135 mmol/L (ref 135–145)

## 2021-09-27 LAB — CBC WITH DIFFERENTIAL/PLATELET
Abs Immature Granulocytes: 0.01 10*3/uL (ref 0.00–0.07)
Basophils Absolute: 0.1 10*3/uL (ref 0.0–0.1)
Basophils Relative: 1 %
Eosinophils Absolute: 0 10*3/uL (ref 0.0–0.5)
Eosinophils Relative: 0 %
HCT: 39.8 % (ref 39.0–52.0)
Hemoglobin: 12.6 g/dL — ABNORMAL LOW (ref 13.0–17.0)
Immature Granulocytes: 0 %
Lymphocytes Relative: 18 %
Lymphs Abs: 1.6 10*3/uL (ref 0.7–4.0)
MCH: 23.6 pg — ABNORMAL LOW (ref 26.0–34.0)
MCHC: 31.7 g/dL (ref 30.0–36.0)
MCV: 74.7 fL — ABNORMAL LOW (ref 80.0–100.0)
Monocytes Absolute: 0.6 10*3/uL (ref 0.1–1.0)
Monocytes Relative: 7 %
Neutro Abs: 6.5 10*3/uL (ref 1.7–7.7)
Neutrophils Relative %: 74 %
Platelets: 318 10*3/uL (ref 150–400)
RBC: 5.33 MIL/uL (ref 4.22–5.81)
RDW: 15.6 % — ABNORMAL HIGH (ref 11.5–15.5)
WBC: 8.8 10*3/uL (ref 4.0–10.5)
nRBC: 0 % (ref 0.0–0.2)

## 2021-09-27 NOTE — ED Notes (Signed)
Pt was called for triage x3. No answer ?

## 2021-09-27 NOTE — ED Triage Notes (Signed)
Patient reports feeling tired " no energy /can't concentrate" for several days .  ?

## 2021-09-27 NOTE — ED Provider Triage Note (Signed)
Emergency Medicine Provider Triage Evaluation Note  John Copeland , a 31 y.o. male  was evaluated in triage.  Pt complains of fatigue/no energy and difficulty concentrating. Pt states that he feels tired but continues to walk around in circles and cannot sit still. He tells me he slept about 4-5 hours last night. We saw him 2 days ago for insomnia. He denies SI, HI, or AVH. He also tells me anytime he gets prescribed medications in the ED he has a hard time locating it. He is requesting any meds be sent to the [facility] [address].   Review of Systems  Positive: + lack of energy, difficulty concentrating Negative: - headache, confusion, difficulty sleeping  Physical Exam  BP (!) 159/81 (BP Location: Left Arm)   Pulse 98   Temp 99 F (37.2 C) (Oral)   Resp (!) 28   SpO2 97%  Gen:   Awake, no distress   Resp:  Normal effort  MSK:   Moves extremities without difficulty  Other:    Medical Decision Making  Medically screening exam initiated at 10:36 PM.  Appropriate orders placed.  John Copeland was informed that the remainder of the evaluation will be completed by another provider, this initial triage assessment does not replace that evaluation, and the importance of remaining in the ED until their evaluation is complete.     Tanda Rockers, PA-C 09/27/21 2239

## 2021-09-27 NOTE — ED Notes (Addendum)
Patient requesting to speak to a chaplain.  MD made aware for order.  Patient also given pen and paper per request. ?

## 2021-09-27 NOTE — ED Notes (Signed)
Called for triage x3. No answer 

## 2021-09-28 ENCOUNTER — Encounter (HOSPITAL_COMMUNITY): Payer: Self-pay | Admitting: Emergency Medicine

## 2021-09-28 ENCOUNTER — Ambulatory Visit (HOSPITAL_COMMUNITY)
Admission: EM | Admit: 2021-09-28 | Discharge: 2021-09-28 | Disposition: A | Discharge status: Home / Self Care | Payer: Medicaid Other | Source: Home / Self Care

## 2021-09-28 ENCOUNTER — Emergency Department (HOSPITAL_COMMUNITY)
Admission: EM | Admit: 2021-09-28 | Discharge: 2021-09-29 | Discharge status: Against Medical Advice | Payer: Medicaid Other | Attending: Emergency Medicine | Admitting: Emergency Medicine

## 2021-09-28 ENCOUNTER — Other Ambulatory Visit: Payer: Self-pay

## 2021-09-28 ENCOUNTER — Emergency Department (HOSPITAL_COMMUNITY)
Admission: EM | Admit: 2021-09-28 | Discharge: 2021-09-28 | Disposition: A | Discharge status: Home / Self Care | Payer: Medicaid Other | Source: Home / Self Care | Attending: Emergency Medicine | Admitting: Emergency Medicine

## 2021-09-28 ENCOUNTER — Encounter (HOSPITAL_COMMUNITY): Payer: Self-pay

## 2021-09-28 DIAGNOSIS — Z87891 Personal history of nicotine dependence: Secondary | ICD-10-CM | POA: Insufficient documentation

## 2021-09-28 DIAGNOSIS — I10 Essential (primary) hypertension: Secondary | ICD-10-CM | POA: Insufficient documentation

## 2021-09-28 DIAGNOSIS — Z5321 Procedure and treatment not carried out due to patient leaving prior to being seen by health care provider: Secondary | ICD-10-CM | POA: Insufficient documentation

## 2021-09-28 DIAGNOSIS — S50811A Abrasion of right forearm, initial encounter: Secondary | ICD-10-CM | POA: Insufficient documentation

## 2021-09-28 DIAGNOSIS — E119 Type 2 diabetes mellitus without complications: Secondary | ICD-10-CM | POA: Insufficient documentation

## 2021-09-28 DIAGNOSIS — Z79899 Other long term (current) drug therapy: Secondary | ICD-10-CM | POA: Insufficient documentation

## 2021-09-28 DIAGNOSIS — Z5900 Homelessness unspecified: Secondary | ICD-10-CM | POA: Insufficient documentation

## 2021-09-28 DIAGNOSIS — S0031XA Abrasion of nose, initial encounter: Secondary | ICD-10-CM | POA: Insufficient documentation

## 2021-09-28 DIAGNOSIS — M25551 Pain in right hip: Secondary | ICD-10-CM | POA: Diagnosis not present

## 2021-09-28 DIAGNOSIS — F419 Anxiety disorder, unspecified: Secondary | ICD-10-CM | POA: Insufficient documentation

## 2021-09-28 DIAGNOSIS — Z5181 Encounter for therapeutic drug level monitoring: Secondary | ICD-10-CM | POA: Insufficient documentation

## 2021-09-28 DIAGNOSIS — R339 Retention of urine, unspecified: Secondary | ICD-10-CM | POA: Insufficient documentation

## 2021-09-28 DIAGNOSIS — S30811A Abrasion of abdominal wall, initial encounter: Secondary | ICD-10-CM | POA: Diagnosis not present

## 2021-09-28 DIAGNOSIS — X58XXXA Exposure to other specified factors, initial encounter: Secondary | ICD-10-CM | POA: Insufficient documentation

## 2021-09-28 NOTE — Discharge Instructions (Addendum)
Discharge recommendations:  Patient is to take medications as prescribed. Sent on 09/18/21. Pick up these medications at [id number] - , [place] - [address] Please follow up with your primary care provider for all medical related needs.   Medications: The parent/guardian is to contact a medical professional and/or outpatient provider to address any new side effects that develop. Parent/guardian should update outpatient providers of any new medications and/or medication changes.   Atypical antipsychotics: If you are prescribed an atypical antipsychotic, it is recommended that your height, weight, BMI, blood pressure, fasting lipid panel, and fasting blood sugar be monitored by your outpatient providers.  Safety:  The patient should abstain from use of illicit substances/drugs and abuse of any medications. If symptoms worsen or do not continue to improve or if the patient becomes actively suicidal or homicidal then it is recommended that the patient return to the closest hospital emergency department, the [facility], or call 911 for further evaluation and treatment. National Suicide Prevention Lifeline 1-800-SUICIDE or (705)254-8958.  About 988 988 offers 24/7 access to trained crisis counselors who can help people experiencing mental health-related distress. People can call or text 988 or chat 988lifeline.org for themselves or if they are worried about a loved one who may need crisis support.

## 2021-09-28 NOTE — ED Triage Notes (Signed)
Pt presents to [facility] escorted by [employer] voluntarily due to medication issues. Pt states he just want to get his medication adjusted.Pt pleasant, calm, cooperative. Pt denies SI/HI and AVH.

## 2021-09-28 NOTE — ED Provider Notes (Signed)
Behavioral Health Urgent Care Medical Screening Exam  Patient Name: John Copeland MRN: [id number] Date of Evaluation: 09/28/21 Diagnosis:  Final diagnoses:  Medication management    History of Present illness: John Copeland is a 31 y.o. male male patient who presents to the [facility] behavioral health urgent care voluntary accompanied by law enforcement with a chief complaint of "need to get medications adjusted."   Patient seen and evaluated face-to-face by his provider, and chart reviewed. On evaluation, patient is alert and oriented to person place and time. His speech is clear and coherent. His mood is anxious and affect is congruent.   Patient reports difficulty concentrating and difficulty remembering things. He states that he needs to get back on his medications and that he was previously prescribed Abilify, Strattera, and Zyprexa.  He states that he has been off his medications for a long time, a couple months. Patient was seen here at the [facility] on 09/18/21 and provided with an e-prescription for Norvasc and Abilify sent to the patient's pharmacy on file. Patient states that he forgot where to pick the medications up from. [facility] I discussed with the patient that medication management is done on an outpatient basis. Patient states that he thought he was coming for medication management. He was advised that medication management is on Monday, Wednesdays, and Fridays from 8 to 11 AM upstairs on the second floor. Patient verbalizes understanding. Patient encouraged to attend outpatient open access hours for medication management tomorrow morning and to arrive at 7:30 AM.  Patient denies suicidal or homicidal ideations. Patient denies auditory or visual hallucinations. There is no objective evidence that the patient is currently responding to internal or external stimuli.  I notified the patient's legal guardian Lear Ng [phone]. Mr. Royetta Crochet states that the patient currently has no place to live and is homeless. He states that he is currently working to get the patient into a group home but he needs a FL2. He was advised that the patient was encouraged to follow up at the [facility] outpatient for medication management. Mr. Royetta Crochet requested that if the patient follows up for outpatient services if a FL2 can be initiated.   Psychiatric Specialty Exam  Presentation  General Appearance:Appropriate for Environment  Eye Contact:Fair  Speech:Clear and Coherent  Speech Volume:Normal  Handedness:Right   Mood and Affect  Mood:Anxious  Affect:Congruent   Thought Process  Thought Processes:Coherent  Descriptions of Associations:Intact  Orientation:Full (Time, Place and Person)  Thought Content:Logical  Diagnosis of Schizophrenia or Schizoaffective disorder in past: No   Hallucinations:None  Ideas of Reference:None  Suicidal Thoughts:No Without Intent; Without Plan  Homicidal Thoughts:No   Sensorium  Memory:Immediate Fair; Recent Fair  Judgment:Intact  Insight:Present   Executive Functions  Concentration:Fair  Attention Span:Fair  Recall:Fair  Fund of Knowledge:Fair  Language:Fair   Psychomotor Activity  Psychomotor Activity:Normal   Assets  Assets:Communication Skills; Desire for Improvement; Social Support; Leisure Time; Physical Health   Sleep  Sleep:Fair  Number of hours: -1   No data recorded  Physical Exam: Physical Exam HENT:     Head: Normocephalic.     Nose: Nose normal.  Eyes:     Conjunctiva/sclera: Conjunctivae normal.  Cardiovascular:     Rate and Rhythm: Normal rate.  Pulmonary:     Effort: Pulmonary effort is normal.  Musculoskeletal:        General: Normal range of motion.  Neurological:  Mental Status: He is alert and oriented to person, place, and time.   Review of Systems  Constitutional: Negative.   HENT: Negative.    Eyes:  Negative.   Respiratory: Negative.    Cardiovascular: Negative.   Gastrointestinal: Negative.   Genitourinary: Negative.   Musculoskeletal: Negative.   Skin: Negative.   Neurological: Negative.   Endo/Heme/Allergies: Negative.   Blood pressure (!) 156/82, pulse 100, resp. rate 18, SpO2 100 %. There is no height or weight on file to calculate BMI.  Musculoskeletal: Strength & Muscle Tone: within normal limits Gait & Station: normal Patient leans: N/A   [facility] MSE Discharge Disposition for Follow up and Recommendations: Based on my evaluation the patient does not appear to have an emergency medical condition and can be discharged with resources and follow up care in outpatient services for Medication Management  Discharge recommendations:  Patient is to take medications as prescribed. Sent on 09/18/21. [id number] [place], [place] - [address] Please follow up with your primary care provider for all medical related needs.   Medications: The parent/guardian is to contact a medical professional and/or outpatient provider to address any new side effects that develop. Parent/guardian should update outpatient providers of any new medications and/or medication changes.   Atypical antipsychotics: If you are prescribed an atypical antipsychotic, it is recommended that your height, weight, BMI, blood pressure, fasting lipid panel, and fasting blood sugar be monitored by your outpatient providers.  Safety:  The patient should abstain from use of illicit substances/drugs and abuse of any medications. If symptoms worsen or do not continue to improve or if the patient becomes actively suicidal or homicidal then it is recommended that the patient return to the closest hospital emergency department, the [facility], or call 911 for further evaluation and treatment. National Suicide Prevention  Lifeline 1-800-SUICIDE or 423-216-1147.  About 988 988 offers 24/7 access to trained crisis counselors who can help people experiencing mental health-related distress. People can call or text 988 or chat 988lifeline.org for themselves or if they are worried about a loved one who may need crisis support.    Follow-up Information     Go to  [facility].   Specialty: Urgent Care Why: Walk-in hours for open access for psychiatry are Mondays, Wednesdays and Fridays 8 am to 11 pm. Please arrive at 7:30 am. Open access for therapy are Mondays, Tuesdays, and Wednesdays from 8:00 am to 11:00 pm. Please arrive at 7:30 am Contact information: [address] [place] [address] [phone]                    Layla Barter, NP 09/28/2021, 1:12 PM

## 2021-09-28 NOTE — ED Triage Notes (Signed)
Patient arrived with EMS from street reports brief right side nose bleed while picking his nose , no bleeding at arrival. Respirations unlabored .

## 2021-09-28 NOTE — ED Triage Notes (Addendum)
Pt initially wanted to be seen for urinary retention. Pt then told us that he lied and that he just has nowhere to go.

## 2021-09-28 NOTE — ED Provider Notes (Signed)
[facility]-EMERGENCY DEPT [facility] Emergency Department Provider Note MRN:  [id number]  Arrival date & time: 09/28/21     Chief Complaint   " No energy"   History of Present Illness   John Copeland is a 31 y.o. year-old male with a history of hypertension, schizophrenia, diabetes presenting to the ED with chief complaint of low energy.  Patient here because he feels generally unhealthy, low energy.  Wants to make sure he is okay.  Has no specific symptoms.  Feels that his mood is normal, denies any significant depression, no SI, no HI, no AVH.  Review of Systems  A thorough review of systems was obtained and all systems are negative except as noted in the HPI and PMH.   Patient's Health History    Past Medical History:  Diagnosis Date   Anxiety    Depression    Diabetes mellitus without complication (HCC)    Homelessness    Hypertension    Schizophrenia (HCC)     History reviewed. No pertinent surgical history.  Family History  Problem Relation Age of Onset   Diabetes Other     Social History   Socioeconomic History   Marital status: Single    Spouse name: Not on file   Number of children: Not on file   Years of education: Not on file   Highest education level: Not on file  Occupational History   Not on file  Tobacco Use   Smoking status: Every Day    Packs/day: 0.00    Types: Cigarettes   Smokeless tobacco: Never  Vaping Use   Vaping Use: Never used  Substance and Sexual Activity   Alcohol use: Yes   Drug use: Not Currently    Types: Marijuana    Comment: denies drug use   Sexual activity: Not Currently  Other Topics Concern   Not on file  Social History Narrative   Not on file   Social Determinants of Health   Financial Resource Strain: Not on file  Food Insecurity: Not on file  Transportation Needs: Not on file  Physical Activity: Not on file  Stress: Not on file  Social Connections: Not on file  Intimate Partner Violence: Not on file      Physical Exam   Vitals:   09/27/21 2221  BP: (!) 159/81  Pulse: 98  Resp: (!) 28  Temp: 99 F (37.2 C)  SpO2: 97%    CONSTITUTIONAL: Well-appearing, NAD NEURO/PSYCH:  Alert and oriented x 3, no focal deficits EYES:  eyes equal and reactive ENT/NECK:  no LAD, no JVD CARDIO: Regular rate, well-perfused, normal S1 and S2 PULM:  CTAB no wheezing or rhonchi GI/GU:  non-distended, non-tender MSK/SPINE:  No gross deformities, no edema SKIN:  no rash, atraumatic   *Additional and/or pertinent findings included in MDM below  Diagnostic and Interventional Summary    EKG Interpretation  Date/Time:    Ventricular Rate:    PR Interval:    QRS Duration:   QT Interval:    QTC Calculation:   R Axis:     Text Interpretation:         Labs Reviewed  CBC WITH DIFFERENTIAL/PLATELET - Abnormal; Notable for the following components:      Result Value   Hemoglobin 12.6 (*)    MCV 74.7 (*)    MCH 23.6 (*)    RDW 15.6 (*)    All other components within normal limits  BASIC METABOLIC PANEL    No orders  to display    Medications - No data to display   Procedures  /  Critical Care Procedures  ED Course and Medical Decision Making  Initial Impression and Ddx Well-appearing, normal vital signs, overall a bit of a hypomanic demeanor.  This is patient's 19th ED visit over the past month.  Visits seem to be related to his schizophrenia.  Does not seem to be a risk of harm to self or others at this time, screening labs are reassuring, he has no specific bodily complaints.  Appropriate for discharge as no emergent process is apparently present.  Past medical/surgical history that increases complexity of ED encounter: Schizophrenia  Interpretation of Diagnostics I personally reviewed the laboratory assessed and my interpretation is as follows: No significant blood count or electrolyte disturbance      Patient Reassessment and Ultimate  Disposition/Management Discharge.  Patient management required discussion with the following services or consulting groups:  None  Complexity of Problems Addressed Acute complicated illness or Injury  Additional Data Reviewed and Analyzed Further history obtained from: Prior labs/imaging results  Additional Factors Impacting ED Encounter Risk None  Elmer Sow. Pilar Plate, MD [facility] Emergency Medicine [facility] [email or web address]  Final Clinical Impressions(s) / ED Diagnoses     ICD-10-CM   1. Low energy  R53.83       ED Discharge Orders     None        Discharge Instructions Discussed with and Provided to Patient:    Discharge Instructions      You were evaluated in the Emergency Department and after careful evaluation, we did not find any emergent condition requiring admission or further testing in the hospital.  Your exam/testing today was overall reassuring.  Please return to the Emergency Department if you experience any worsening of your condition.  Thank you for allowing [place] to be a part of your care.       Sabas Sous, MD 09/28/21 [phone]

## 2021-09-28 NOTE — Discharge Instructions (Signed)
You were evaluated in the Emergency Department and after careful evaluation, we did not find any emergent condition requiring admission or further testing in the hospital.  Your exam/testing today was overall reassuring.  Please return to the Emergency Department if you experience any worsening of your condition.  Thank you for allowing us to be a part of your care.  

## 2021-09-28 NOTE — ED Provider Triage Note (Signed)
Emergency Medicine Provider Triage Evaluation Note  John Copeland , a 31 y.o. male  was evaluated in triage.  Pt complains of urinary retention.  Denies Hx of kidney stones.  Denies dysuria, flank pain, back pain, or any urinary symptoms at all.  When I was about to leave the room, patient states "I lied".  "I lied because I have nowhere to go, I can pee".  Patient then returned to the waiting room.  He was seen earlier in the ED for different complaint.  22 ED visits in the last 6 months.  Homeless.  Hx of schizophrenia, HTN, diabetes, tobacco use, alcohol abuse disorder.  Review of Systems  Positive:  Negative: As above  Physical Exam  BP (!) 141/110   Temp 98.5 F (36.9 C) (Oral)   Resp 17   Ht 6' (1.829 m)   Wt 88 kg   SpO2 100%   BMI 26.31 kg/m  Gen:   Awake, no distress   Resp:  Normal effort  MSK:   Moves extremities without difficulty  Other:  Abdomen soft nontender.  Inappropriate and flat affect.  Inappropriate eye contact.  Delayed responses.  May be listening/responding to internal stimuli.  Medical Decision Making  Medically screening exam initiated at 10:27 PM.  Appropriate orders placed.  Isidor Caban was informed that the remainder of the evaluation will be completed by another provider, this initial triage assessment does not replace that evaluation, and the importance of remaining in the ED until their evaluation is complete.    Prince Rome, PA-C [phone]

## 2021-09-28 NOTE — ED Provider Notes (Signed)
[facility] EMERGENCY DEPARTMENT Provider Note   CSN: [id number] Arrival date & time: 09/28/21  0253     History  Chief Complaint  Patient presents with   Epistaxis    John Copeland is a 31 y.o. male who is seen in the emergency department earlier this evening by EDP who returns at this time stating that after he was discharged he picked his nose and it began to bleed.  It is no longer bleeding at time my evaluation.  Patient states that he just wants a safe place to sleep as he is homeless.  No other concerns at this time.  I personally reviewed his medical records.  He has history of alcohol abuse disorder, schizophrenia, hypertension diabetes, and tobacco abuse.  Patient was recently prescribed hydroxyzine by ED provider for anxiety.  Unclear if patient is taking this medication.  HPI     Home Medications Prior to Admission medications   Medication Sig Start Date End Date Taking? Authorizing Provider  amLODipine (NORVASC) 5 MG tablet Take 1 tablet (5 mg total) by mouth daily. Patient not taking: Reported on 09/22/2021 09/18/21   Lauro Franklin, MD  ARIPiprazole (ABILIFY) 10 MG tablet Take 1 tablet (10 mg total) by mouth daily. Patient not taking: Reported on 09/22/2021 09/19/21 10/19/21  Lauro Franklin, MD  atomoxetine (STRATTERA) 25 MG capsule Take 25 mg by mouth daily. Patient not taking: Reported on 09/22/2021    [provider]  buPROPion (WELLBUTRIN XL) 150 MG 24 hr tablet Take 150 mg by mouth daily. Patient not taking: Reported on 09/22/2021    [provider]  hydrOXYzine (ATARAX) 25 MG tablet Take 1 tablet (25 mg total) by mouth at bedtime. 09/25/21   Jeannie Fend, PA-C      Allergies    Risperdal [risperidone]    Review of Systems   Review of Systems  Psychiatric/Behavioral:         Homelessness, desiring place to sleep   Physical Exam Updated Vital Signs BP (!) 155/75 (BP Location: Right Arm)   Pulse 73    Temp 98.8 F (37.1 C) (Oral)   Resp 18   SpO2 100%  Physical Exam Vitals and nursing note reviewed.  Constitutional:      Appearance: He is not ill-appearing or toxic-appearing.  HENT:     Head: Normocephalic and atraumatic.     Nose:   Eyes:     General: No scleral icterus.       Right eye: No discharge.        Left eye: No discharge.     Conjunctiva/sclera: Conjunctivae normal.  Cardiovascular:     Heart sounds: Normal heart sounds. No murmur heard.   No gallop.  Pulmonary:     Effort: Pulmonary effort is normal.     Breath sounds: Normal breath sounds.  Skin:    General: Skin is warm and dry.     Capillary Refill: Capillary refill takes less than 2 seconds.  Neurological:     General: No focal deficit present.     Mental Status: He is alert.  Psychiatric:        Mood and Affect: Mood normal.    ED Results / Procedures / Treatments   Labs (all labs ordered are listed, but only abnormal results are displayed) Labs Reviewed - No data to display  EKG None  Radiology No results found.  Procedures Procedures    Medications Ordered in ED Medications - No data to  display  ED Course/ Medical Decision Making/ A&P                           Medical Decision Making 31 year old male who is homeless who presents requesting place to sleep as well as concern for abrasion to internal nose that was bleeding.  Hypertensive on intake, vital signs otherwise normal.  Cardiopulmonary and abdominal exams are benign.  Nasal exam as above, unremarkable.  Patient cooperative, calm, and able to ambulate without difficulty.  Does not appear to be any medical concerns at this time.  Patient is medically cleared. Will allow patient to rest in the emergency department while awaiting room is empty, and will discharge him after he has rested.  Gabreil voiced understanding of his medical evaluation and treatment plan. Each of their questions answered to their expressed satisfaction.   Return precautions were given.  Patient is well-appearing, stable, and was discharged in good condition.  This chart was dictated using voice recognition software, Dragon. Despite the best efforts of this provider to proofread and correct errors, errors may still occur which can change documentation meaning.     Final Clinical Impression(s) / ED Diagnoses Final diagnoses:  Homelessness    Rx / DC Orders ED Discharge Orders     None         Paris Lore, PA-C 09/28/21 1610    Sabas Sous, MD 09/28/21 [phone]

## 2021-09-29 ENCOUNTER — Encounter (HOSPITAL_COMMUNITY): Payer: Self-pay | Admitting: Emergency Medicine

## 2021-09-29 ENCOUNTER — Emergency Department (HOSPITAL_COMMUNITY)
Admission: EM | Admit: 2021-09-29 | Discharge: 2021-09-29 | Disposition: A | Discharge status: Home / Self Care | Payer: Medicaid Other | Source: Home / Self Care | Attending: Emergency Medicine | Admitting: Emergency Medicine

## 2021-09-29 ENCOUNTER — Other Ambulatory Visit: Payer: Self-pay

## 2021-09-29 ENCOUNTER — Emergency Department (HOSPITAL_COMMUNITY): Payer: Medicaid Other

## 2021-09-29 DIAGNOSIS — I10 Essential (primary) hypertension: Secondary | ICD-10-CM | POA: Insufficient documentation

## 2021-09-29 DIAGNOSIS — M25551 Pain in right hip: Secondary | ICD-10-CM | POA: Insufficient documentation

## 2021-09-29 DIAGNOSIS — S50811A Abrasion of right forearm, initial encounter: Secondary | ICD-10-CM | POA: Insufficient documentation

## 2021-09-29 DIAGNOSIS — F419 Anxiety disorder, unspecified: Secondary | ICD-10-CM | POA: Insufficient documentation

## 2021-09-29 DIAGNOSIS — S30811A Abrasion of abdominal wall, initial encounter: Secondary | ICD-10-CM | POA: Insufficient documentation

## 2021-09-29 DIAGNOSIS — Z79899 Other long term (current) drug therapy: Secondary | ICD-10-CM | POA: Insufficient documentation

## 2021-09-29 DIAGNOSIS — E119 Type 2 diabetes mellitus without complications: Secondary | ICD-10-CM | POA: Insufficient documentation

## 2021-09-29 LAB — COMPREHENSIVE METABOLIC PANEL
ALT: 41 U/L (ref 0–44)
AST: 56 U/L — ABNORMAL HIGH (ref 15–41)
Albumin: 4 g/dL (ref 3.5–5.0)
Alkaline Phosphatase: 43 U/L (ref 38–126)
Anion gap: 13 (ref 5–15)
BUN: 18 mg/dL (ref 6–20)
CO2: 23 mmol/L (ref 22–32)
Calcium: 9.5 mg/dL (ref 8.9–10.3)
Chloride: 103 mmol/L (ref 98–111)
Creatinine, Ser: 1.14 mg/dL (ref 0.61–1.24)
GFR, Estimated: >60 mL/min (ref >60)
Glucose, Bld: 79 mg/dL (ref 70–99)
Potassium: 4.1 mmol/L (ref 3.5–5.1)
Sodium: 139 mmol/L (ref 135–145)
Total Bilirubin: 0.7 mg/dL (ref 0.3–1.2)
Total Protein: 6.8 g/dL (ref 6.5–8.1)

## 2021-09-29 LAB — CBC WITH DIFFERENTIAL/PLATELET
Abs Immature Granulocytes: 0.01 10*3/uL (ref 0.00–0.07)
Basophils Absolute: 0.1 10*3/uL (ref 0.0–0.1)
Basophils Relative: 1 %
Eosinophils Absolute: 0 10*3/uL (ref 0.0–0.5)
Eosinophils Relative: 0 %
HCT: 39.2 % (ref 39.0–52.0)
Hemoglobin: 12.5 g/dL — ABNORMAL LOW (ref 13.0–17.0)
Immature Granulocytes: 0 %
Lymphocytes Relative: 20 %
Lymphs Abs: 1.2 10*3/uL (ref 0.7–4.0)
MCH: 23.5 pg — ABNORMAL LOW (ref 26.0–34.0)
MCHC: 31.9 g/dL (ref 30.0–36.0)
MCV: 73.8 fL — ABNORMAL LOW (ref 80.0–100.0)
Monocytes Absolute: 0.5 10*3/uL (ref 0.1–1.0)
Monocytes Relative: 8 %
Neutro Abs: 4.1 10*3/uL (ref 1.7–7.7)
Neutrophils Relative %: 71 %
Platelets: 286 10*3/uL (ref 150–400)
RBC: 5.31 MIL/uL (ref 4.22–5.81)
RDW: 15.3 % (ref 11.5–15.5)
WBC: 5.8 10*3/uL (ref 4.0–10.5)
nRBC: 0 % (ref 0.0–0.2)

## 2021-09-29 LAB — ETHANOL: Alcohol, Ethyl (B): <10 mg/dL (ref <10)

## 2021-09-29 MED ORDER — ARIPIPRAZOLE 10 MG PO TABS
10.0000 mg | ORAL_TABLET | Freq: Once | ORAL | Status: AC
  Administered 2021-09-29: 10 mg via ORAL
  Filled 2021-09-29: qty 1

## 2021-09-29 MED ORDER — HYDROXYZINE HCL 10 MG PO TABS
10.0000 mg | ORAL_TABLET | Freq: Once | ORAL | Status: AC
  Administered 2021-09-29: 10 mg via ORAL
  Filled 2021-09-29: qty 1

## 2021-09-29 MED ORDER — AMLODIPINE BESYLATE 5 MG PO TABS
5.0000 mg | ORAL_TABLET | Freq: Once | ORAL | Status: AC
  Administered 2021-09-29: 5 mg via ORAL
  Filled 2021-09-29: qty 1
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
## 2021-09-29 NOTE — Progress Notes (Signed)
Transition of Care [facility]) - Emergency Department Mini Assessment   Patient Details  Name: John Copeland MRN: [id number] Date of Birth: [birth year]  [facility]) CM/SW Contact:    Vergie Living, LCSW Phone Number: 09/29/2021, 6:25 PM   Clinical Narrative:    ED Mini Assessment: What brought you to the Emergency Department? : (P) Paranoia  Barriers to Discharge: (P) No Barriers Identified  Barrier interventions: (P) CSW noted high utilization of ED reviewed chart and spoke with guardian who states that Pt need FL2 for group home placement. CSW met with Pt at bedside. Pt is A&Ox4 and pleasantbut reports anxiety. CSW led Pt in breathing exercises to manage stress. Pt able to relax. Pt is tangential with pressured speech at times but states that he is feeling better. CSW wrote FL2 to assist with housing search. Pt provided with bus pass and sack lunch for d/c.  Means of departure: (P) Public Transportation  Interventions which prevented an admission or readmission: (P) Transportation Screening, Homeless Screening    Patient Contact and Communications Key Contact 1: (P) Franklin with: (P) Bernard Pate-Guardian Contact Date: (P) 09/29/21,   Contact time: (P) [place] Phone Number: (P) [phone]           Admission diagnosis:  Z04.6 Patient Active Problem List   Diagnosis Date Noted   Alcohol use disorder, severe, dependence ([place]) 09/05/2021   Anxiety state 07/10/2020   Left lower lobe pneumonia 06/26/2017   Accidental drug overdose    Schizophrenia ([place]) 03/29/2017   Polydipsia 01/12/2017   Diphenhydramine overdose of undetermined intent 01/10/2017   Tobacco use disorder 10/07/2016   Diabetes ([place]) 10/07/2016   HTN (hypertension) 10/07/2016   Dyslipidemia 10/07/2016   Schizoaffective disorder, bipolar type ([place]) 11/30/2015   PCP:  Pcp, No Pharmacy:   [id number] [place] [place]Phone: [phone]: [phone] [place] [facility] [place] [id number]: [phone]: [phone] [place]- [address] [place] [address]Phone: [phone]: [phone]

## 2021-09-29 NOTE — Discharge Instructions (Addendum)
Your medications are located at [facility] at corner of [address]    Go to  [facility].   Specialty: Urgent Care Why: Walk-in hours for open access for psychiatry are Mondays, Wednesdays and Fridays 8 am to 11 pm. Please arrive at 7:30 am. Open access for therapy are Mondays, Tuesdays, and Wednesdays from 8:00 am to 11:00 pm. Please arrive at 7:30 am Contact information: [place]

## 2021-09-29 NOTE — [place] FL2 (Signed)
  [place] MEDICAID FL2 LEVEL OF CARE SCREENING TOOL     IDENTIFICATION  Patient Name: John Copeland Birthdate: Oct 05, 1990 Sex: male Admission Date (Current Location): 09/29/2021  [facility] and [place] Number:  Producer, television/film/video and Address:  [facility], [address], [place], [place] [address]      Provider Number: [id number]  Attending Physician Name and Address:  Alvira Monday, MD  Relative Name and Phone Number:  Pate,Bernard Legal Guardian [phone]    Current Level of Care: Hospital Recommended Level of Care: Other (Comment), Family Care Home (Group Home) Prior Approval Number:    Date Approved/Denied:   PASRR Number:    Discharge Plan: Other (Comment) (Group Home)    Current Diagnoses: Patient Active Problem List   Diagnosis Date Noted   Alcohol use disorder, severe, dependence (H[facility]) 09/05/2021   Anxiety state 07/10/2020   Left lower lobe pneumonia 06/26/2017   Accidental drug overdose    Schizophrenia (H[facility]) 03/29/2017   Polydipsia 01/12/2017   Diphenhydramine overdose of undetermined intent 01/10/2017   Tobacco use disorder 10/07/2016   Diabetes (H[facility]) 10/07/2016   HTN (hypertension) 10/07/2016   Dyslipidemia 10/07/2016   Schizoaffective disorder, bipolar type ([facility]) 11/30/2015    Orientation RESPIRATION BLADDER Height & Weight     Self, Time, Situation, Place  Normal Continent Weight:   Height:     BEHAVIORAL SYMPTOMS/MOOD NEUROLOGICAL BOWEL NUTRITION STATUS      Continent Diet (Regular)  AMBULATORY STATUS COMMUNICATION OF NEEDS Skin   Independent Verbally Normal                       Personal Care Assistance Level of Assistance  Bathing, Feeding, Dressing Bathing Assistance: Independent Feeding assistance: Independent Dressing Assistance: Independent     Functional Limitations Info  Sight, Hearing, Speech Sight Info: Adequate Hearing Info: Adequate Speech Info: Adequate    SPECIAL CARE FACTORS  FREQUENCY                       Contractures Contractures Info: Not present    Additional Factors Info  Code Status, Allergies Code Status Info: Full Allergies Info: Risperdal           Current Medications (09/29/2021):  This is the current hospital active medication list No current facility-administered medications for this encounter.   Current Outpatient Medications  Medication Sig Dispense Refill   amLODipine (NORVASC) 5 MG tablet Take 1 tablet (5 mg total) by mouth daily. (Patient not taking: Reported on 09/22/2021) 30 tablet 0   ARIPiprazole (ABILIFY) 10 MG tablet Take 1 tablet (10 mg total) by mouth daily. (Patient not taking: Reported on 09/22/2021) 30 tablet 0   atomoxetine (STRATTERA) 25 MG capsule Take 25 mg by mouth daily. (Patient not taking: Reported on 09/22/2021)     buPROPion (WELLBUTRIN XL) 150 MG 24 hr tablet Take 150 mg by mouth daily. (Patient not taking: Reported on 09/22/2021)     hydrOXYzine (ATARAX) 25 MG tablet Take 1 tablet (25 mg total) by mouth at bedtime. 12 tablet 0     Discharge Medications: Please see discharge summary for a list of discharge medications.  Relevant Imaging Results:  Relevant Lab Results:   Additional Information SSN# [id number]/ Ht& Wt: 6' 194lbs  Valeri Sula, LCSW

## 2021-09-29 NOTE — ED Triage Notes (Signed)
Patient coming from home, complaint of being paranoid, started this afternoon. Pt states he has not been taking his medications as prescribed.
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
## 2021-09-29 NOTE — ED Triage Notes (Signed)
Patient reports pain at right elbow and right hip today , denies injury /ambulatory .
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
## 2021-09-29 NOTE — ED Provider Triage Note (Signed)
Emergency Medicine Provider Triage Evaluation Note  [place] , a 31 y.o. male  was evaluated in triage.  Pt complains of feeling paranoid onset prior to arrival.  No meds tried prior to arrival. Denies SI, HI, auditory/visual hallucinations.  Patient notes he has not been taking his medications as prescribed.   Review of Systems  Positive: As per HPI above Negative:   Physical Exam  BP (!) 160/101 (BP Location: Left Arm)   Pulse (!) 108   Temp 98.1 F (36.7 C)   Resp 16   SpO2 100%  Gen:   Awake, no distress   Resp:  Normal effort  MSK:   Moves extremities without difficulty  Other:    Medical Decision Making  Medically screening exam initiated at 4:39 PM.  Appropriate orders placed.  Gillis Boardley was informed that the remainder of the evaluation will be completed by another provider, this initial triage assessment does not replace that evaluation, and the importance of remaining in the ED until their evaluation is complete.  Work-up initiated.    Tresean Mattix A, PA-C 09/29/21 1642

## 2021-09-30 ENCOUNTER — Other Ambulatory Visit: Payer: Self-pay

## 2021-09-30 ENCOUNTER — Emergency Department (HOSPITAL_COMMUNITY)
Admission: EM | Admit: 2021-09-30 | Discharge: 2021-09-30 | Disposition: A | Discharge status: Home / Self Care | Payer: Medicaid Other | Attending: Emergency Medicine | Admitting: Emergency Medicine

## 2021-09-30 ENCOUNTER — Encounter (HOSPITAL_COMMUNITY): Payer: Self-pay | Admitting: Emergency Medicine

## 2021-09-30 ENCOUNTER — Emergency Department (HOSPITAL_COMMUNITY)
Admission: EM | Admit: 2021-09-30 | Discharge: 2021-09-30 | Disposition: A | Discharge status: Home / Self Care | Payer: Medicaid Other | Source: Home / Self Care

## 2021-09-30 DIAGNOSIS — T2692XA Corrosion of left eye and adnexa, part unspecified, initial encounter: Secondary | ICD-10-CM | POA: Insufficient documentation

## 2021-09-30 DIAGNOSIS — S80811A Abrasion, right lower leg, initial encounter: Secondary | ICD-10-CM | POA: Insufficient documentation

## 2021-09-30 DIAGNOSIS — Z5321 Procedure and treatment not carried out due to patient leaving prior to being seen by health care provider: Secondary | ICD-10-CM | POA: Insufficient documentation

## 2021-09-30 DIAGNOSIS — T6594XA Toxic effect of unspecified substance, undetermined, initial encounter: Secondary | ICD-10-CM | POA: Diagnosis not present

## 2021-09-30 DIAGNOSIS — S8991XA Unspecified injury of right lower leg, initial encounter: Secondary | ICD-10-CM | POA: Diagnosis present

## 2021-09-30 DIAGNOSIS — T593X4A Toxic effect of lacrimogenic gas, undetermined, initial encounter: Secondary | ICD-10-CM | POA: Insufficient documentation

## 2021-09-30 DIAGNOSIS — Y9289 Other specified places as the place of occurrence of the external cause: Secondary | ICD-10-CM | POA: Insufficient documentation

## 2021-09-30 DIAGNOSIS — Z77098 Contact with and (suspected) exposure to other hazardous, chiefly nonmedicinal, chemicals: Secondary | ICD-10-CM

## 2021-09-30 DIAGNOSIS — X58XXXA Exposure to other specified factors, initial encounter: Secondary | ICD-10-CM | POA: Diagnosis not present

## 2021-09-30 MED ORDER — FLUORESCEIN SODIUM 1 MG OP STRP
1.0000 | ORAL_STRIP | Freq: Once | OPHTHALMIC | Status: DC
  Filled 2021-09-30: qty 1

## 2021-09-30 MED ORDER — TETRACAINE HCL 0.5 % OP SOLN
2.0000 [drp] | Freq: Once | OPHTHALMIC | Status: AC
  Administered 2021-09-30: 2 [drp] via OPHTHALMIC
  Filled 2021-09-30: qty 4
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
## 2021-09-30 NOTE — ED Notes (Signed)
Pt seen leaving with his belongings.
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
## 2021-10-01 ENCOUNTER — Emergency Department (HOSPITAL_COMMUNITY)
Admission: EM | Admit: 2021-10-01 | Discharge: 2021-10-01 | Disposition: A | Discharge status: Home / Self Care | Payer: Medicaid Other | Attending: Emergency Medicine | Admitting: Emergency Medicine

## 2021-10-01 ENCOUNTER — Encounter (HOSPITAL_COMMUNITY): Payer: Self-pay | Admitting: Emergency Medicine

## 2021-10-01 ENCOUNTER — Other Ambulatory Visit: Payer: Self-pay

## 2021-10-01 DIAGNOSIS — E119 Type 2 diabetes mellitus without complications: Secondary | ICD-10-CM | POA: Diagnosis not present

## 2021-10-01 DIAGNOSIS — M79672 Pain in left foot: Secondary | ICD-10-CM | POA: Insufficient documentation

## 2021-10-01 DIAGNOSIS — M79671 Pain in right foot: Secondary | ICD-10-CM | POA: Insufficient documentation

## 2021-10-01 DIAGNOSIS — Z79899 Other long term (current) drug therapy: Secondary | ICD-10-CM | POA: Insufficient documentation

## 2021-10-01 DIAGNOSIS — I10 Essential (primary) hypertension: Secondary | ICD-10-CM | POA: Insufficient documentation

## 2021-10-01 MED ORDER — IBUPROFEN 200 MG PO TABS
600.0000 mg | ORAL_TABLET | Freq: Once | ORAL | Status: AC
  Administered 2021-10-01: 600 mg via ORAL
  Filled 2021-10-01: qty 1
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
## 2021-10-01 NOTE — Discharge Instructions (Signed)
Please see attached informational guide concerning foot pain Please review resource guide with shelters You may continue taking ibuprofen every 6 hours for your foot pain as well as icing them

## 2021-10-01 NOTE — ED Triage Notes (Signed)
Patient reports bilateral foot pain onset today , ambulatory,no injury.

## 2021-10-02 ENCOUNTER — Encounter (HOSPITAL_COMMUNITY): Payer: Self-pay | Admitting: *Deleted

## 2021-10-02 ENCOUNTER — Other Ambulatory Visit: Payer: Self-pay

## 2021-10-02 ENCOUNTER — Emergency Department (HOSPITAL_COMMUNITY)
Admission: EM | Admit: 2021-10-02 | Discharge: 2021-10-02 | Disposition: A | Discharge status: Home / Self Care | Payer: No Typology Code available for payment source | Attending: Emergency Medicine | Admitting: Emergency Medicine

## 2021-10-02 DIAGNOSIS — F1510 Other stimulant abuse, uncomplicated: Secondary | ICD-10-CM

## 2021-10-02 DIAGNOSIS — R000 Tachycardia, unspecified: Secondary | ICD-10-CM | POA: Diagnosis not present

## 2021-10-02 DIAGNOSIS — I10 Essential (primary) hypertension: Secondary | ICD-10-CM | POA: Diagnosis not present

## 2021-10-02 DIAGNOSIS — F1590 Other stimulant use, unspecified, uncomplicated: Secondary | ICD-10-CM | POA: Insufficient documentation

## 2021-10-02 LAB — BASIC METABOLIC PANEL
Anion gap: 8 (ref 5–15)
BUN: 9 mg/dL (ref 6–20)
CO2: 24 mmol/L (ref 22–32)
Calcium: 9.2 mg/dL (ref 8.9–10.3)
Chloride: 106 mmol/L (ref 98–111)
Creatinine, Ser: 0.98 mg/dL (ref 0.61–1.24)
GFR, Estimated: >60 mL/min (ref >60)
Glucose, Bld: 111 mg/dL — ABNORMAL HIGH (ref 70–99)
Potassium: 3.8 mmol/L (ref 3.5–5.1)
Sodium: 138 mmol/L (ref 135–145)

## 2021-10-02 LAB — CBC WITH DIFFERENTIAL/PLATELET
Abs Immature Granulocytes: 0.01 10*3/uL (ref 0.00–0.07)
Basophils Absolute: 0 10*3/uL (ref 0.0–0.1)
Basophils Relative: 1 %
Eosinophils Absolute: 0 10*3/uL (ref 0.0–0.5)
Eosinophils Relative: 0 %
HCT: 35.1 % — ABNORMAL LOW (ref 39.0–52.0)
Hemoglobin: 11.7 g/dL — ABNORMAL LOW (ref 13.0–17.0)
Immature Granulocytes: 0 %
Lymphocytes Relative: 16 %
Lymphs Abs: 1 10*3/uL (ref 0.7–4.0)
MCH: 24.3 pg — ABNORMAL LOW (ref 26.0–34.0)
MCHC: 33.3 g/dL (ref 30.0–36.0)
MCV: 73 fL — ABNORMAL LOW (ref 80.0–100.0)
Monocytes Absolute: 0.5 10*3/uL (ref 0.1–1.0)
Monocytes Relative: 8 %
Neutro Abs: 4.8 10*3/uL (ref 1.7–7.7)
Neutrophils Relative %: 75 %
Platelets: 233 10*3/uL (ref 150–400)
RBC: 4.81 MIL/uL (ref 4.22–5.81)
RDW: 14.8 % (ref 11.5–15.5)
WBC: 6.3 10*3/uL (ref 4.0–10.5)
nRBC: 0 % (ref 0.0–0.2)

## 2021-10-02 LAB — RAPID URINE DRUG SCREEN, HOSP PERFORMED
Amphetamines: POSITIVE — AB
Barbiturates: NOT DETECTED
Benzodiazepines: NOT DETECTED
Cocaine: NOT DETECTED
Opiates: NOT DETECTED
Tetrahydrocannabinol: NOT DETECTED

## 2021-10-02 MED ORDER — SODIUM CHLORIDE 0.9 % IV BOLUS
1000.0000 mL | Freq: Once | INTRAVENOUS | Status: AC
  Administered 2021-10-02: 1000 mL via INTRAVENOUS
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
## 2021-10-02 NOTE — Discharge Instructions (Signed)
Return for any problem.  Do not use illicit drugs
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
## 2021-10-03 ENCOUNTER — Encounter (HOSPITAL_COMMUNITY): Payer: Self-pay | Admitting: Emergency Medicine

## 2021-10-03 ENCOUNTER — Emergency Department (HOSPITAL_COMMUNITY)
Admission: EM | Admit: 2021-10-03 | Discharge: 2021-10-03 | Discharge status: Against Medical Advice | Payer: Medicaid Other | Attending: Emergency Medicine | Admitting: Emergency Medicine

## 2021-10-03 ENCOUNTER — Telehealth (HOSPITAL_COMMUNITY): Payer: Self-pay

## 2021-10-03 DIAGNOSIS — Z5321 Procedure and treatment not carried out due to patient leaving prior to being seen by health care provider: Secondary | ICD-10-CM | POA: Insufficient documentation

## 2021-10-03 DIAGNOSIS — Z760 Encounter for issue of repeat prescription: Secondary | ICD-10-CM | POA: Diagnosis present

## 2021-10-03 NOTE — ED Triage Notes (Signed)
Patient BIB [facility] after patient was found by fire department sleeping outside. Patient states he had no complaints and was just trying to sleep but fire department offered him a ride to the hospital to "get checked out" so he accepted. Patient states since he is here he would like to talk to a psychiatrist about restarting some psychiatric medications he used to take. Patient is alert, oriented, calm, and cooperative at this time.

## 2021-10-03 NOTE — ED Notes (Signed)
RN was speaking to sort tech regarding another pt. Pt came up to desk and started yelling at sort techs, pointing fingers, stating "yall don't know my story and you treat me like shit". RN told pt to stop, and to not be disrespectful to staff as they are doing their jobs. Pt stated he was going to put his shoes on and leave. RN asked him to return to his chair and put his shoes on. Pt continued standing, speaking under his breath. Security was asked to escort pt out, pt started yelling again "No they're not telling me to leave, I am leaving  because I want to and yall don't do shit and treat me like shit." Pt grabbed his shoes and walked out of ED.

## 2021-10-03 NOTE — BH Assessment (Signed)
Care Management - Alex Follow Up Discharges   Writer attempted to make contact with patient's legal guardian Corey Harold) with [facility], [phone] today and was unsuccessful.    Writer left a HIPPA compliant voice message.  Per chart review, patient was provided with outpatient resources.

## 2021-10-04 ENCOUNTER — Other Ambulatory Visit: Payer: Self-pay

## 2021-10-04 ENCOUNTER — Emergency Department (HOSPITAL_COMMUNITY)
Admission: EM | Admit: 2021-10-04 | Discharge: 2021-10-04 | Disposition: A | Discharge status: Home / Self Care | Payer: Medicaid Other | Attending: Student | Admitting: Student

## 2021-10-04 ENCOUNTER — Encounter (HOSPITAL_COMMUNITY): Payer: Self-pay

## 2021-10-04 DIAGNOSIS — I10 Essential (primary) hypertension: Secondary | ICD-10-CM | POA: Insufficient documentation

## 2021-10-04 DIAGNOSIS — E119 Type 2 diabetes mellitus without complications: Secondary | ICD-10-CM | POA: Diagnosis not present

## 2021-10-04 DIAGNOSIS — Z008 Encounter for other general examination: Secondary | ICD-10-CM

## 2021-10-04 DIAGNOSIS — Z1330 Encounter for screening examination for mental health and behavioral disorders, unspecified: Secondary | ICD-10-CM | POA: Insufficient documentation

## 2021-10-04 DIAGNOSIS — H15849 Scleral ectasia, unspecified eye: Secondary | ICD-10-CM | POA: Insufficient documentation
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
## 2022-07-18 ENCOUNTER — Other Ambulatory Visit: Payer: Self-pay

## 2022-07-18 ENCOUNTER — Emergency Department (HOSPITAL_COMMUNITY): Payer: No Typology Code available for payment source

## 2022-07-18 ENCOUNTER — Encounter (HOSPITAL_COMMUNITY): Payer: Self-pay

## 2022-07-18 ENCOUNTER — Emergency Department (HOSPITAL_COMMUNITY)
Admission: EM | Admit: 2022-07-18 | Discharge: 2022-07-18 | Disposition: A | Discharge status: Home / Self Care | Payer: No Typology Code available for payment source | Source: Home / Self Care | Attending: Emergency Medicine | Admitting: Emergency Medicine

## 2022-07-18 ENCOUNTER — Emergency Department (HOSPITAL_COMMUNITY)
Admission: EM | Admit: 2022-07-18 | Discharge: 2022-07-18 | Disposition: A | Discharge status: Home / Self Care | Payer: No Typology Code available for payment source | Attending: Emergency Medicine | Admitting: Emergency Medicine

## 2022-07-18 DIAGNOSIS — I10 Essential (primary) hypertension: Secondary | ICD-10-CM | POA: Insufficient documentation

## 2022-07-18 DIAGNOSIS — F250 Schizoaffective disorder, bipolar type: Secondary | ICD-10-CM | POA: Insufficient documentation

## 2022-07-18 DIAGNOSIS — T50901A Poisoning by unspecified drugs, medicaments and biological substances, accidental (unintentional), initial encounter: Secondary | ICD-10-CM | POA: Diagnosis not present

## 2022-07-18 DIAGNOSIS — Z79899 Other long term (current) drug therapy: Secondary | ICD-10-CM | POA: Diagnosis not present

## 2022-07-18 DIAGNOSIS — R079 Chest pain, unspecified: Secondary | ICD-10-CM

## 2022-07-18 DIAGNOSIS — F259 Schizoaffective disorder, unspecified: Secondary | ICD-10-CM

## 2022-07-18 DIAGNOSIS — E119 Type 2 diabetes mellitus without complications: Secondary | ICD-10-CM | POA: Insufficient documentation

## 2022-07-18 DIAGNOSIS — R443 Hallucinations, unspecified: Secondary | ICD-10-CM | POA: Diagnosis present

## 2022-07-18 DIAGNOSIS — T6591XA Toxic effect of unspecified substance, accidental (unintentional), initial encounter: Secondary | ICD-10-CM

## 2022-07-18 LAB — CBC
HCT: 39.5 % (ref 39.0–52.0)
Hemoglobin: 12.9 g/dL — ABNORMAL LOW (ref 13.0–17.0)
MCH: 23.5 pg — ABNORMAL LOW (ref 26.0–34.0)
MCHC: 32.7 g/dL (ref 30.0–36.0)
MCV: 72.1 fL — ABNORMAL LOW (ref 80.0–100.0)
Platelets: 269 10*3/uL (ref 150–400)
RBC: 5.48 MIL/uL (ref 4.22–5.81)
RDW: 15.2 % (ref 11.5–15.5)
WBC: 9.9 10*3/uL (ref 4.0–10.5)
nRBC: 0 % (ref 0.0–0.2)

## 2022-07-18 LAB — CBC WITH DIFFERENTIAL/PLATELET
Abs Immature Granulocytes: 0.03 10*3/uL (ref 0.00–0.07)
Basophils Absolute: 0 10*3/uL (ref 0.0–0.1)
Basophils Relative: 0 %
Eosinophils Absolute: 0 10*3/uL (ref 0.0–0.5)
Eosinophils Relative: 0 %
HCT: 39.1 % (ref 39.0–52.0)
Hemoglobin: 12.4 g/dL — ABNORMAL LOW (ref 13.0–17.0)
Immature Granulocytes: 0 %
Lymphocytes Relative: 12 %
Lymphs Abs: 1.3 10*3/uL (ref 0.7–4.0)
MCH: 23.2 pg — ABNORMAL LOW (ref 26.0–34.0)
MCHC: 31.7 g/dL (ref 30.0–36.0)
MCV: 73.2 fL — ABNORMAL LOW (ref 80.0–100.0)
Monocytes Absolute: 0.4 10*3/uL (ref 0.1–1.0)
Monocytes Relative: 4 %
Neutro Abs: 8.6 10*3/uL — ABNORMAL HIGH (ref 1.7–7.7)
Neutrophils Relative %: 84 %
Platelets: 282 10*3/uL (ref 150–400)
RBC: 5.34 MIL/uL (ref 4.22–5.81)
RDW: 15.1 % (ref 11.5–15.5)
WBC: 10.3 10*3/uL (ref 4.0–10.5)
nRBC: 0 % (ref 0.0–0.2)

## 2022-07-18 LAB — ACETAMINOPHEN LEVEL
Acetaminophen (Tylenol), Serum: <10 ug/mL — ABNORMAL LOW (ref 10–30)
Acetaminophen (Tylenol), Serum: <10 ug/mL — ABNORMAL LOW (ref 10–30)

## 2022-07-18 LAB — BASIC METABOLIC PANEL
Anion gap: 5 (ref 5–15)
Anion gap: 12 (ref 5–15)
BUN: 13 mg/dL (ref 6–20)
BUN: 15 mg/dL (ref 6–20)
CO2: 22 mmol/L (ref 22–32)
CO2: 21 mmol/L — ABNORMAL LOW (ref 22–32)
Calcium: 8.7 mg/dL — ABNORMAL LOW (ref 8.9–10.3)
Calcium: 9.3 mg/dL (ref 8.9–10.3)
Chloride: 104 mmol/L (ref 98–111)
Chloride: 103 mmol/L (ref 98–111)
Creatinine, Ser: 0.82 mg/dL (ref 0.61–1.24)
Creatinine, Ser: 0.89 mg/dL (ref 0.61–1.24)
GFR, Estimated: >60 mL/min (ref >60)
GFR, Estimated: >60 mL/min (ref >60)
Glucose, Bld: 67 mg/dL — ABNORMAL LOW (ref 70–99)
Glucose, Bld: 61 mg/dL — ABNORMAL LOW (ref 70–99)
Potassium: 4 mmol/L (ref 3.5–5.1)
Potassium: 4.3 mmol/L (ref 3.5–5.1)
Sodium: 131 mmol/L — ABNORMAL LOW (ref 135–145)
Sodium: 136 mmol/L (ref 135–145)

## 2022-07-18 LAB — RAPID URINE DRUG SCREEN, HOSP PERFORMED
Amphetamines: NOT DETECTED
Barbiturates: NOT DETECTED
Benzodiazepines: NOT DETECTED
Cocaine: NOT DETECTED
Opiates: NOT DETECTED
Tetrahydrocannabinol: NOT DETECTED

## 2022-07-18 LAB — TROPONIN I (HIGH SENSITIVITY)
Troponin I (High Sensitivity): 7 ng/L (ref <18)
Troponin I (High Sensitivity): 7 ng/L (ref <18)

## 2022-07-18 LAB — CBG MONITORING, ED: Glucose-Capillary: 129 mg/dL — ABNORMAL HIGH (ref 70–99)

## 2022-07-18 LAB — SALICYLATE LEVEL
Salicylate Lvl: <7 mg/dL — ABNORMAL LOW (ref 7.0–30.0)
Salicylate Lvl: <7 mg/dL — ABNORMAL LOW (ref 7.0–30.0)

## 2022-07-18 LAB — ETHANOL
Alcohol, Ethyl (B): <10 mg/dL (ref <10)
Alcohol, Ethyl (B): <10 mg/dL (ref <10)

## 2022-07-18 MED ORDER — GLUCOSE 40 % PO GEL: 1.0000 | Freq: Once | ORAL | Status: DC

## 2022-07-18 NOTE — ED Provider Notes (Signed)
[place] AT [facility] Provider Note   CSN: [id number] Arrival date & time: 07/18/22  [id number]     History  Chief Complaint  Patient presents with   Altered Mental Status    John Copeland is a 32 y.o. male.  32 year old male with prior medical history as detailed below presents for evaluation.  Patient is presenting from [facility] with EMS transport.  Patient reports that [facility] staff called EMS.  Patient reported to [facility] that he had taken approximately 19 tablets of Mucinex this morning.  Patient reports that he took the Mucinex so that it would "bring him closer to God".  Patient with longstanding history of schizoaffective disorder bipolar type.  Patient with longstanding history of hyperreligious behaviors.  On evaluation today, patient is wearing a Cross as a necklace.  Patient is wearing a longsleeve white button-down underneath a black T-shirt.  He is praying intermittently throughout the entire interview.  He denies suicidal intent.  He denies homicidal intent.  He is calm and cooperative during evaluation.      The history is provided by the patient and medical records.       Home Medications Prior to Admission medications   Medication Sig Start Date End Date Taking? Authorizing Provider  amLODipine (NORVASC) 5 MG tablet Take 1 tablet (5 mg total) by mouth daily. Patient not taking: Reported on 09/22/2021 09/18/21   Briant Cedar, MD  ARIPiprazole (ABILIFY) 10 MG tablet Take 1 tablet (10 mg total) by mouth daily. Patient not taking: Reported on 09/22/2021 09/19/21 10/19/21  Briant Cedar, MD  atomoxetine (STRATTERA) 25 MG capsule Take 25 mg by mouth daily. Patient not taking: Reported on 09/22/2021    [provider]  buPROPion (WELLBUTRIN XL) 150 MG 24 hr tablet Take 150 mg by mouth daily. Patient not taking: Reported on 09/22/2021    [provider]  hydrOXYzine (ATARAX) 25 MG tablet Take 1 tablet (25 mg total)  by mouth at bedtime. 09/25/21   Tacy Learn, PA-C      Allergies    Risperdal [risperidone]    Review of Systems   Review of Systems  All other systems reviewed and are negative.   Physical Exam Updated Vital Signs BP (!) 161/100 (BP Location: Left Arm)   Pulse (!) 116   Temp 98.6 F (37 C) (Oral)   Resp 18   Ht 6' (1.829 m)   Wt 81.2 kg   SpO2 97%   BMI 24.28 kg/m  Physical Exam Vitals and nursing note reviewed.  Constitutional:      General: He is not in acute distress.    Appearance: Normal appearance. He is well-developed.  HENT:     Head: Normocephalic and atraumatic.  Eyes:     Conjunctiva/sclera: Conjunctivae normal.     Pupils: Pupils are equal, round, and reactive to light.  Cardiovascular:     Rate and Rhythm: Normal rate and regular rhythm.     Heart sounds: Normal heart sounds.  Pulmonary:     Effort: Pulmonary effort is normal. No respiratory distress.     Breath sounds: Normal breath sounds.  Abdominal:     General: There is no distension.     Palpations: Abdomen is soft.     Tenderness: There is no abdominal tenderness.  Musculoskeletal:        General: No deformity. Normal range of motion.     Cervical back: Normal range of motion and neck supple.  Skin:  General: Skin is warm and dry.  Neurological:     General: No focal deficit present.     Mental Status: He is alert and oriented to person, place, and time.     Comments: Alert, calm, cooperative, hyperreligious     ED Results / Procedures / Treatments   Labs (all labs ordered are listed, but only abnormal results are displayed) Labs Reviewed  CBC WITH DIFFERENTIAL/PLATELET - Abnormal; Notable for the following components:      Result Value   Hemoglobin 12.4 (*)    MCV 73.2 (*)    MCH 23.2 (*)    Neutro Abs 8.6 (*)    All other components within normal limits  BASIC METABOLIC PANEL - Abnormal; Notable for the following components:   Sodium 131 (*)    Glucose, Bld 67 (*)     Calcium 8.7 (*)    All other components within normal limits  SALICYLATE LEVEL - Abnormal; Notable for the following components:   Salicylate Lvl Q000111Q (*)    All other components within normal limits  ACETAMINOPHEN LEVEL - Abnormal; Notable for the following components:   Acetaminophen (Tylenol), Serum <10 (*)    All other components within normal limits  ETHANOL    EKG None  Radiology No results found.  Procedures Procedures    Medications Ordered in ED Medications - No data to display  ED Course/ Medical Decision Making/ A&P                             Medical Decision Making Amount and/or Complexity of Data Reviewed Labs: ordered.    Medical Screen Complete  This patient presented to the ED with complaint of possible overdose.  This complaint involves an extensive number of treatment options. The initial differential diagnosis includes, but is not limited to, overdose, metabolic abnormality, etc.  This presentation is: Acute, Chronic, Self-Limited, Previously Undiagnosed, Uncertain Prognosis, Complicated, Systemic Symptoms, and Threat to Life/Bodily Function  Patient with known history of schizoaffective disorder.  Patient presents after possible overdose on Mucinex.  It is difficult to obtain specific history from the patient.  However, he is able to specifically denies suicidal ideation or homicidal ideation.  He is calm and appropriate during the entirety of his interaction with this provider.  Screening labs are without significant abnormality.  After observation, the patient is without evidence of toxidrome consistent with Mucinex overdose.  The patient is comfortable with plan for discharge.  Patient is advised that he should only take medications as instructed.  Importance of close follow-up is stressed.  Strict return precautions given and understood.  Additional history obtained:  External records from outside sources obtained and reviewed  including prior ED visits and prior Inpatient records.    Lab Tests:  I ordered and personally interpreted labs.  The pertinent results include: CBC, BMP, Tylenol, salicylate, EtOH Cardiac Monitoring:  The patient was maintained on a cardiac monitor.  I personally viewed and interpreted the cardiac monitor which showed an underlying rhythm of: Sinus tach, NSR  Problem List / ED Course:  Schizoaffective disorder, possible overdose   Reevaluation:  After the interventions noted above, I reevaluated the patient and found that they have: improved   Disposition:  After consideration of the diagnostic results and the patients response to treatment, I feel that the patent would benefit from close outpatient follow-up.          Final Clinical Impression(s) / ED Diagnoses  Final diagnoses:  Schizoaffective disorder, unspecified type [facility])    Rx / DC Orders ED Discharge Orders     None         Valarie Merino, MD 07/18/22 1353

## 2022-07-18 NOTE — Discharge Instructions (Signed)
  Return for any problem.  Only take medicines as instructed or prescribed.  Never take excessive amounts of medications -including over-the-counter medications.

## 2022-07-18 NOTE — ED Notes (Signed)
Patient back in bed.

## 2022-07-18 NOTE — ED Notes (Signed)
Unable to locate patient at this time. Charge nurse aware.

## 2022-07-18 NOTE — ED Triage Notes (Addendum)
Pt arrives via EMS. PT reports he was seen at [facility] after taking 28 Musinex tablets this morning. PT states he left and was at [facility] when he called 911 due to having some chest pain. Pt states the pain worsens when he touches his chest or moves.  EMS did administer '324mg'$  of ASA and 1 SL Nitro. Pt states he took the mucinex to feel better. Denies SI or HI. Pt is currently AxOx4. Does appear drowsy.

## 2022-07-18 NOTE — ED Notes (Signed)
Dc instructions reviewed with pt by doctor. Pt verbalized understanding and stated he is walking to bus stop and then going back to the shelter. No questions or conerns. Ambulated with steady gait

## 2022-07-18 NOTE — ED Provider Notes (Signed)
[place] Provider Note   CSN: [id number] Arrival date & time: 07/18/22  1552     History  Chief Complaint  Patient presents with   Chest Pain    John Copeland is a 32 y.o. male with past medical history significant for schizoaffective disorder, bipolar type, tobacco use disorder, diabetes, hypertension, schizophrenia, previous accidental drug overdose, severe alcohol use disorder who presents with concern for chest pain which feels like squeezing.  Patient was just seen evaluated at [facility] this morning reported that he took 74 Mucinex tablets at that time to "get closer to God".  On arrival to the ED here today he reports that he had taken 28 Mucinex tablets, when I attempted to clarify with the patient he reports "I just took a lot I am not sure".  He reports that he also was taking capfuls of Listerine, but cannot tell me how much total he took.  He denies any other alcohol or drug use.  He reports he has not been taking his normal antipsychotic medication.  He denies any SI, HI, or AVH at this time.  History of hyperreligiosity, however compared to earlier exam performed at [facility] patient is not actively crying or talking about religion during my assessment.   Chest Pain      Home Medications Prior to Admission medications   Medication Sig Start Date End Date Taking? Authorizing Provider  ABILIFY MAINTENA 400 MG PRSY prefilled syringe Inject 400 mg into the muscle every 28 (twenty-eight) days.    [provider]  amLODipine (NORVASC) 5 MG tablet Take 1 tablet (5 mg total) by mouth daily. Patient not taking: Reported on 09/22/2021 09/18/21   Briant Cedar, MD  ARIPiprazole (ABILIFY) 10 MG tablet Take 1 tablet (10 mg total) by mouth daily. 09/19/21 07/18/22  Briant Cedar, MD  atomoxetine (STRATTERA) 25 MG capsule Take 25 mg by mouth daily. Patient not taking: Reported on 09/22/2021    [provider]  buPROPion (WELLBUTRIN XL) 150 MG 24 hr tablet Take 150 mg by mouth daily. Patient not taking: Reported on 09/22/2021    [provider]  hydrOXYzine (ATARAX) 25 MG tablet Take 1 tablet (25 mg total) by mouth at bedtime. 09/25/21   Tacy Learn, PA-C      Allergies    Risperidone    Review of Systems   Review of Systems  Cardiovascular:  Positive for chest pain.  All other systems reviewed and are negative.   Physical Exam Updated Vital Signs BP (!) 151/95 (BP Location: Left Arm)   Pulse 90   Temp 98 F (36.7 C) (Oral)   Resp 18   Ht 6' (1.829 m)   Wt 81.2 kg   SpO2 97%   BMI 24.28 kg/m  Physical Exam Vitals and nursing note reviewed.  Constitutional:      General: He is not in acute distress.    Appearance: Normal appearance.     Comments: Patient was poorly arousable on reevaluation, but did respond to sternal rub, afterwards he was alert and oriented x 4, answering all questions appropriately.  HENT:     Head: Normocephalic and atraumatic.  Eyes:     General:        Right eye: No discharge.        Left eye: No discharge.  Cardiovascular:     Rate and Rhythm: Normal rate and regular rhythm.     Heart sounds: No  murmur heard.    No friction rub. No gallop.  Pulmonary:     Effort: Pulmonary effort is normal.     Breath sounds: Normal breath sounds.  Abdominal:     General: Bowel sounds are normal.     Palpations: Abdomen is soft.  Skin:    General: Skin is warm and dry.     Capillary Refill: Capillary refill takes less than 2 seconds.  Neurological:     Mental Status: He is alert and oriented to person, place, and time.  Psychiatric:        Mood and Affect: Mood normal.        Behavior: Behavior normal.     Comments: Does appear somewhat drowsy, but answers questions appropriately, denies SI, HI, AVH     ED Results / Procedures / Treatments   Labs (all labs ordered are listed, but only abnormal results are displayed) Labs  Reviewed  BASIC METABOLIC PANEL - Abnormal; Notable for the following components:      Result Value   CO2 21 (*)    Glucose, Bld 61 (*)    All other components within normal limits  CBC - Abnormal; Notable for the following components:   Hemoglobin 12.9 (*)    MCV 72.1 (*)    MCH 23.5 (*)    All other components within normal limits  ACETAMINOPHEN LEVEL - Abnormal; Notable for the following components:   Acetaminophen (Tylenol), Serum <10 (*)    All other components within normal limits  SALICYLATE LEVEL - Abnormal; Notable for the following components:   Salicylate Lvl Q000111Q (*)    All other components within normal limits  CBG MONITORING, ED - Abnormal; Notable for the following components:   Glucose-Capillary 129 (*)    All other components within normal limits  RAPID URINE DRUG SCREEN, HOSP PERFORMED  ETHANOL  TROPONIN I (HIGH SENSITIVITY)  TROPONIN I (HIGH SENSITIVITY)    EKG None  Radiology DG Chest 2 View  Result Date: 07/18/2022 CLINICAL DATA:  Chest pain, dizziness EXAM: CHEST - 2 VIEW COMPARISON:  06/19/2020 FINDINGS: The heart size and mediastinal contours are within normal limits. Both lungs are clear. The visualized skeletal structures are unremarkable. IMPRESSION: No active cardiopulmonary disease. Electronically Signed   By: Elmer Picker M.D.   On: 07/18/2022 16:40    Procedures Procedures    Medications Ordered in ED Medications  dextrose (GLUTOSE) oral gel 40% (31 g Oral Not Given 07/18/22 1750)    ED Course/ Medical Decision Making/ A&P Clinical Course as of 07/18/22 2351  Wed Jul 18, 2022  1821 Stable, endorsed mucinex OD. Likely non-psychiatric [CC]  1935 On reevaluation patient is quite somnolent likely from his ingestion, he is arousable to sternal rub however, we will get a delta troponin, continue to monitor for return to the normal mental status, he is likely stable for discharge at that time [CP]    Clinical Course User Index [CC]  Tretha Sciara, MD [CP] Carlus Pavlov Joesph Fillers, PA-C                             Medical Decision Making Amount and/or Complexity of Data Reviewed Labs: ordered. Radiology: ordered.  Risk OTC drugs.   This patient is a 32 y.o. male  who presents to the ED for concern of chest pain, mucinex over ingestion.   Differential diagnoses prior to evaluation: The emergent differential diagnosis includes, but is not limited to,  ACS, AAS, PE, Mallory-Weiss, Boerhaave's, Pneumonia, acute bronchitis, asthma or COPD exacerbation, anxiety, MSK pain or traumatic injury to the chest, acid reflux versus, psychotic break, SI, HI, AVH . This is not an exhaustive differential.   Past Medical History / Co-morbidities: Schizoaffective disorder, tobacco use, schizophrenia, anxiety, alcohol abuse, opiate abuse  Additional history: Chart reviewed. Pertinent results include: Reviewed lab work, imaging from recent previous emergency department visits, notably patient was already seen and evaluated at [facility] earlier today and medically cleared at that time  Physical Exam: Physical exam performed. The pertinent findings include: Patient somewhat somnolent on exam but is arousable, at one moment he was somewhat deep in sleep but arouses to sternal rub briskly, he is remained alert, oriented x 4.  He is easily directable, his answers to questions are send cycle, and he seems to have good insight.  He is somewhat hyperreligious, reporting that he had taken the Mucinex in an attempt to get closer to God but "realizes that this is wrong" and expresses that he does not want to do this again.  He denies any suicidal ideation with his attempt.  His vital signs have been stable other than some hypertension, blood pressure 151/95 at time of discharge.  Lab Tests/Imaging studies: I personally interpreted labs/imaging and the pertinent results include: BMP with mild bicarb deficit, he was hypoglycemic on initial  evaluation but repeat CBG of 129.  Troponin negative x 2, CBC unremarkable other than very mild anemia with microcytic quality.  Independently interpreted plain film chest x-ray which shows no evidence of acute intrathoracic abnormality.  I agree with the radiologist interpretation.  Cardiac monitoring: EKG obtained and interpreted by my attending physician which shows: NSR   Disposition: After consideration of the diagnostic results and the patients response to treatment, I feel that patient is appropriate for discharge, considered psych evaluation, admission, but no evidence of attempted overdose today, I do recommend the patient restart his psych medications and follow-up for outpatient psych counseling and resources, no evidence of ACS or medical reason to explain his chest pain today, he is chest pain-free at time of discharge.   emergency department workup does not suggest an emergent condition requiring admission or immediate intervention beyond what has been performed at this time. The plan is: as above. The patient is safe for discharge and has been instructed to return immediately for worsening symptoms, change in symptoms or any other concerns.  Final Clinical Impression(s) / ED Diagnoses Final diagnoses:  Chest pain, unspecified type  Ingestion of substance, accidental or unintentional, initial encounter    Rx / DC Orders ED Discharge Orders     None         Anselmo Pickler, PA-C 07/18/22 2351    Tretha Sciara, MD 07/19/22 [phone]

## 2022-07-18 NOTE — ED Notes (Signed)
Pt is confused and repeatedly trying to elope. Staff is redirecting to pts room.

## 2022-07-18 NOTE — ED Notes (Signed)
Pt told Probation officer he took a whole bottle of mucinex since last night. When writer inquired why pt said he thought it would make him a better person and bring him closer to God

## 2022-07-18 NOTE — ED Triage Notes (Signed)
BIB EMS after stating he took 28 Musinex pills this am. Pt living at [facility], pt hallucinating since. Has psych history of Hyper religious 190/104 #20 [place]

## 2022-07-19 ENCOUNTER — Other Ambulatory Visit: Payer: Self-pay

## 2022-07-19 ENCOUNTER — Emergency Department (HOSPITAL_COMMUNITY)
Admission: EM | Admit: 2022-07-19 | Discharge: 2022-07-20 | Disposition: A | Discharge status: Home / Self Care | Payer: No Typology Code available for payment source | Attending: Emergency Medicine | Admitting: Emergency Medicine

## 2022-07-19 DIAGNOSIS — F17210 Nicotine dependence, cigarettes, uncomplicated: Secondary | ICD-10-CM | POA: Diagnosis not present

## 2022-07-19 DIAGNOSIS — F250 Schizoaffective disorder, bipolar type: Secondary | ICD-10-CM | POA: Diagnosis present

## 2022-07-19 DIAGNOSIS — Z5900 Homelessness unspecified: Secondary | ICD-10-CM | POA: Diagnosis not present

## 2022-07-19 DIAGNOSIS — F209 Schizophrenia, unspecified: Secondary | ICD-10-CM | POA: Diagnosis present

## 2022-07-19 DIAGNOSIS — E119 Type 2 diabetes mellitus without complications: Secondary | ICD-10-CM | POA: Insufficient documentation

## 2022-07-19 DIAGNOSIS — I10 Essential (primary) hypertension: Secondary | ICD-10-CM | POA: Diagnosis not present

## 2022-07-19 LAB — CBC
HCT: 39.6 % (ref 39.0–52.0)
Hemoglobin: 12.8 g/dL — ABNORMAL LOW (ref 13.0–17.0)
MCH: 23.4 pg — ABNORMAL LOW (ref 26.0–34.0)
MCHC: 32.3 g/dL (ref 30.0–36.0)
MCV: 72.5 fL — ABNORMAL LOW (ref 80.0–100.0)
Platelets: 312 10*3/uL (ref 150–400)
RBC: 5.46 MIL/uL (ref 4.22–5.81)
RDW: 15.4 % (ref 11.5–15.5)
WBC: 7.7 10*3/uL (ref 4.0–10.5)
nRBC: 0 % (ref 0.0–0.2)

## 2022-07-19 LAB — COMPREHENSIVE METABOLIC PANEL
ALT: 29 U/L (ref 0–44)
AST: 47 U/L — ABNORMAL HIGH (ref 15–41)
Albumin: 3.9 g/dL (ref 3.5–5.0)
Alkaline Phosphatase: 46 U/L (ref 38–126)
Anion gap: 12 (ref 5–15)
BUN: 15 mg/dL (ref 6–20)
CO2: 23 mmol/L (ref 22–32)
Calcium: 9.3 mg/dL (ref 8.9–10.3)
Chloride: 103 mmol/L (ref 98–111)
Creatinine, Ser: 0.86 mg/dL (ref 0.61–1.24)
GFR, Estimated: >60 mL/min (ref >60)
Glucose, Bld: 92 mg/dL (ref 70–99)
Potassium: 4.4 mmol/L (ref 3.5–5.1)
Sodium: 138 mmol/L (ref 135–145)
Total Bilirubin: 0.6 mg/dL (ref 0.3–1.2)
Total Protein: 7.1 g/dL (ref 6.5–8.1)

## 2022-07-19 LAB — RAPID URINE DRUG SCREEN, HOSP PERFORMED
Amphetamines: NOT DETECTED
Barbiturates: NOT DETECTED
Benzodiazepines: NOT DETECTED
Cocaine: NOT DETECTED
Opiates: NOT DETECTED
Tetrahydrocannabinol: NOT DETECTED

## 2022-07-19 LAB — ETHANOL: Alcohol, Ethyl (B): 55 mg/dL — ABNORMAL HIGH (ref <10)

## 2022-07-19 LAB — ACETAMINOPHEN LEVEL: Acetaminophen (Tylenol), Serum: <10 ug/mL — ABNORMAL LOW (ref 10–30)

## 2022-07-19 LAB — SALICYLATE LEVEL: Salicylate Lvl: <7 mg/dL — ABNORMAL LOW (ref 7.0–30.0)

## 2022-07-19 MED ORDER — BUPROPION HCL ER (XL) 150 MG PO TB24
150.0000 mg | ORAL_TABLET | Freq: Once | ORAL | Status: AC
  Administered 2022-07-19: 150 mg via ORAL
  Filled 2022-07-19: qty 1

## 2022-07-19 MED ORDER — AMLODIPINE BESYLATE 5 MG PO TABS
5.0000 mg | ORAL_TABLET | Freq: Once | ORAL | Status: AC
  Administered 2022-07-19: 5 mg via ORAL
  Filled 2022-07-19: qty 1

## 2022-07-19 MED ORDER — DULOXETINE HCL 20 MG PO CPEP
20.0000 mg | ORAL_CAPSULE | Freq: Once | ORAL | Status: AC
  Administered 2022-07-19: 20 mg via ORAL
  Filled 2022-07-19: qty 1

## 2022-07-19 MED ORDER — ATOMOXETINE HCL 25 MG PO CAPS
25.0000 mg | ORAL_CAPSULE | Freq: Once | ORAL | Status: DC
  Filled 2022-07-19: qty 1

## 2022-07-19 MED ORDER — HYDROXYZINE HCL 25 MG PO TABS
25.0000 mg | ORAL_TABLET | Freq: Every day | ORAL | Status: DC
  Administered 2022-07-19: 25 mg via ORAL
  Filled 2022-07-19: qty 1

## 2022-07-19 MED ORDER — ARIPIPRAZOLE 10 MG PO TABS
10.0000 mg | ORAL_TABLET | Freq: Once | ORAL | Status: AC
  Administered 2022-07-19: 10 mg via ORAL
  Filled 2022-07-19: qty 1

## 2022-07-19 NOTE — ED Notes (Signed)
EDP at bedside  

## 2022-07-19 NOTE — ED Notes (Signed)
RN witnessed patient take pills in the hallway. Patient stated that it was his last 2 Robitussin pills. RN removed patients belongings from him and asked him not to take anymore home medications while he is at the hospital.

## 2022-07-19 NOTE — ED Triage Notes (Signed)
Patient requesting psychiatric treatment reports delusions /auditory hallucinations with flights of ideas at initial encounter at triage . Denies SI or HI .

## 2022-07-19 NOTE — ED Notes (Signed)
RN attempted to call [facility] legal guardian in chart with no answer-Monique,RN

## 2022-07-19 NOTE — ED Provider Notes (Signed)
[facility] Provider Note   CSN: [id number] Arrival date & time: 07/19/22  2046     History  Chief Complaint  Patient presents with   Psychiatric Evaluation     John Copeland is a 32 y.o. male with past medical history significant for schizoaffective disorder, bipolar, schizophrenia, anxiety, diabetes, tobacco abuse, hypertension who presents with request for psychiatric treatment secondary to delusions, auditory hallucinations, flight of ideas, he denies any SI, HI.  He reports that he just wants to "give everybody million dollars".  This is his fourth emergency department evaluation in 40 hours.  He had taken a large quantity of Mucinex yesterday.  Patient does report that he took some Robitussin just prior to arrival.  He denies any other drug use, he reports he only took 2 Robitussin capsules.  HPI     Home Medications Prior to Admission medications   Medication Sig Start Date End Date Taking? Authorizing Provider  ABILIFY MAINTENA 400 MG PRSY prefilled syringe Inject 400 mg into the muscle every 28 (twenty-eight) days.    [provider]  amLODipine (NORVASC) 5 MG tablet Take 1 tablet (5 mg total) by mouth daily. Patient not taking: Reported on 09/22/2021 09/18/21   Briant Cedar, MD  ARIPiprazole (ABILIFY) 10 MG tablet Take 1 tablet (10 mg total) by mouth daily. 09/19/21 07/18/22  Briant Cedar, MD  atomoxetine (STRATTERA) 25 MG capsule Take 25 mg by mouth daily. Patient not taking: Reported on 09/22/2021    [provider]  buPROPion (WELLBUTRIN XL) 150 MG 24 hr tablet Take 150 mg by mouth daily. Patient not taking: Reported on 09/22/2021    [provider]  hydrOXYzine (ATARAX) 25 MG tablet Take 1 tablet (25 mg total) by mouth at bedtime. 09/25/21   Tacy Learn, PA-C      Allergies    Risperidone    Review of Systems   Review of Systems  All other systems reviewed and are  negative.   Physical Exam Updated Vital Signs BP (!) 145/90   Pulse (!) 106   Temp 98.3 F (36.8 C)   Resp 18   SpO2 98%  Physical Exam Vitals and nursing note reviewed.  Constitutional:      General: He is not in acute distress.    Appearance: Normal appearance.  HENT:     Head: Normocephalic and atraumatic.  Eyes:     General:        Right eye: No discharge.        Left eye: No discharge.  Cardiovascular:     Rate and Rhythm: Regular rhythm. Tachycardia present.     Heart sounds: No murmur heard.    No friction rub. No gallop.  Pulmonary:     Effort: Pulmonary effort is normal.     Breath sounds: Normal breath sounds.  Abdominal:     General: Bowel sounds are normal.     Palpations: Abdomen is soft.  Skin:    General: Skin is warm and dry.     Capillary Refill: Capillary refill takes less than 2 seconds.  Neurological:     Mental Status: He is alert and oriented to person, place, and time.  Psychiatric:        Mood and Affect: Mood normal.        Behavior: Behavior normal.     ED Results / Procedures / Treatments   Labs (all labs ordered are listed, but only abnormal results  are displayed) Labs Reviewed  COMPREHENSIVE METABOLIC PANEL - Abnormal; Notable for the following components:      Result Value   AST 47 (*)    All other components within normal limits  ETHANOL - Abnormal; Notable for the following components:   Alcohol, Ethyl (B) 55 (*)    All other components within normal limits  CBC - Abnormal; Notable for the following components:   Hemoglobin 12.8 (*)    MCV 72.5 (*)    MCH 23.4 (*)    All other components within normal limits  RAPID URINE DRUG SCREEN, HOSP PERFORMED  SALICYLATE LEVEL  ACETAMINOPHEN LEVEL    EKG None  Radiology DG Chest 2 View  Result Date: 07/18/2022 CLINICAL DATA:  Chest pain, dizziness EXAM: CHEST - 2 VIEW COMPARISON:  06/19/2020 FINDINGS: The heart size and mediastinal contours are within normal limits. Both lungs  are clear. The visualized skeletal structures are unremarkable. IMPRESSION: No active cardiopulmonary disease. Electronically Signed   By: Elmer Picker M.D.   On: 07/18/2022 16:40    Procedures Procedures    Medications Ordered in ED Medications  buPROPion (WELLBUTRIN XL) 24 hr tablet 150 mg (has no administration in time range)  hydrOXYzine (ATARAX) tablet 25 mg (25 mg Oral Given 07/19/22 2144)  amLODipine (NORVASC) tablet 5 mg (5 mg Oral Given 07/19/22 2144)  ARIPiprazole (ABILIFY) tablet 10 mg (10 mg Oral Given 07/19/22 2144)  DULoxetine (CYMBALTA) DR capsule 20 mg (20 mg Oral Given 07/19/22 2205)    ED Course/ Medical Decision Making/ A&P Clinical Course as of 07/19/22 2249  Thu Jul 19, 2022  2248 Medically cleared [CP]    Clinical Course User Index [CP] Anselmo Pickler, PA-C                             Medical Decision Making Amount and/or Complexity of Data Reviewed Labs: ordered.   Patient is a 32 y.o. male  who presents to the emergency department for psychiatric complaint.  Past Medical History:  schizoaffective disorder, bipolar, schizophrenia, anxiety, diabetes, tobacco abuse, hypertension  Physical Exam: Patient is pleasant, denies SI, HI, he is mildly tachycardic, pulse 106, blood pressure 145/90, he does have some flight of ideas, he does endorse some delusions, reporting that he wants to buy everyone the world million dollars, but without any evidence of responding to internal stimuli, he is easily directable, he has appropriate answers to questions overall  Labs: CMP notable for very mild elevation of AST at 47, otherwise unremarkable, CBC with mild anemia, hemoglobin 12.8, no clinically significant leukocytosis or platelet dysfunction.  His UDS is negative, ethanol level is mildly elevated at 55 suggesting he may have had something to drink tonight.  Acetaminophen and salicylate in process but low clinical suspicion that they will be  elevated  Medications: Given patient reports that he is having a exacerbation of his schizophrenia I think is reasonable to refill his home medications, we do not have Strattera on formulary after discussion with the pharmacist we will administer duloxetine, give him his home Norvasc, Abilify, hydroxyzine, and Wellbutrin.  Disposition: Patient is medically cleared at this time. Will consult TTS and appreciate their recommendations.  I discussed this case with my attending physician Dr. Gilford Raid who cosigned this note including patient's presenting symptoms, physical exam, and planned diagnostics and interventions. Attending physician stated agreement with plan or made changes to plan which were implemented.   Final Clinical Impression(s) / ED  Diagnoses Final diagnoses:  None    Rx / DC Orders ED Discharge Orders     None         Dorien Chihuahua 07/19/22 2249    Isla Pence, MD 07/19/22 2259

## 2022-07-19 NOTE — ED Notes (Signed)
Patient wanted someone to pray with him, RN offered chaplain be paged he accepted the offer. Secretary paged the chaplain for him.

## 2022-07-20 DIAGNOSIS — T6591XA Toxic effect of unspecified substance, accidental (unintentional), initial encounter: Secondary | ICD-10-CM | POA: Insufficient documentation

## 2022-07-20 DIAGNOSIS — F250 Schizoaffective disorder, bipolar type: Secondary | ICD-10-CM

## 2022-07-20 DIAGNOSIS — T50902A Poisoning by unspecified drugs, medicaments and biological substances, intentional self-harm, initial encounter: Secondary | ICD-10-CM | POA: Insufficient documentation
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
## 2022-07-20 NOTE — Discharge Instructions (Signed)
[facility]  [facility]  OPENS AT 8:00 AM-3:00 PM REOPENS AT 8:00 PM  [address]  [place], [place] [address] [phone]  

## 2022-07-20 NOTE — ED Notes (Signed)
ED Provider at bedside. 

## 2022-07-20 NOTE — ED Provider Notes (Signed)
Emergency Medicine Observation Re-evaluation Note  John Copeland is a 32 y.o. male, seen on rounds today.  Pt initially presented to the ED for complaints of Psychiatric Evaluation  Currently, the patient is calm, no distress. Bh re-eval pending.   Physical Exam  BP 139/88 (BP Location: Left Arm)   Pulse 72   Temp 98 F (36.7 C) (Oral)   Resp 18   SpO2 98%  Physical Exam General: calm, nad.  Cardiac: regular rate.  Lungs: breathing comfortably Psych: normal mood/affect. No SI. At times pt appears to be responding to internal stimuli, and expresses delusional thoughts.   ED Course / MDM   I have reviewed the labs performed to date as well as medications administered while in observation.  Recent changes in the last 24 hours include ED obs, BH reassessment.   Plan  Current plan is for [facility] team to reassess this AM.    Disposition and [place] med management per [facility] team.       Lajean Saver, MD 07/20/22 [phone]

## 2022-07-20 NOTE — Progress Notes (Signed)
Shelter resources and information to the [facility] has been attached to patients AVS.

## 2022-07-20 NOTE — ED Notes (Signed)
TTS in process 

## 2022-07-20 NOTE — Consult Note (Cosign Needed Addendum)
John Copeland Psych ED Progress Note  07/20/2022 11:15 AM John Copeland  MRN:  [id number]   Subjective: Patient stated " I am just kind of stressed".  John Copeland is a 32 year old male patient with a history of schizoaffective disorder bipolar type, tobacco use disorder, schizophrenia, suicidal thoughts, ADHD, bipolar disorder, who presented to the ED on 03/6/204 due to ingesting 19 Mucinex tablets to get closer to God. Patient was assessed, and medically cleared.   Patient has been seen at the emergency department for evaluation up to 4X in the last 48 hours. During admission at the ED, he was observed taking 2 Robitussin tablets from his pocket. At some point during admission he eloped from the ED, patient was later found.  This morning during evaluation, patient stated "I am just kind of stressed".  Patient says "I am okay now, I was just stressed and overwhelmed but now I am okay". Patient does not give much information about his current mental status. Patient reported that he has been staying at the [facility] shelter and he would like a ride or bus ticket back to the shelter. Patient denies using drugs or alcohol currently. Patient denies having a therapist or a psychiatrist.   On evaluation patient is alert and oriented x3, speech is clear and coherent. Patient's eye contact is fair, mood is euthymic, affect is congruent. Patient's thought process is coherent and thought content is logical. Patient denies SI HI, denies AVH, or paranoia. The patient is not responding to internal stimuli and no delusion noted.  Patient denies access to gun or fire weapon.  Patient contracts for safety at this time and expressed interest to go back to his shelter.   Collateral information obtained from legal Rich Number [id number].  She reported to this provider that patient was discharged from [facility] last Friday 07/13/22, prior to that he was at [facility] for few weeks for inpatient  treatment. John Copeland says she cannot apply for disability for him until November. She says patient has been placed in a group home couple of times but has been kicked out due to [facility] staff. John Copeland says it's hard to place patient in a group home due to his aggression history.  John Copeland says the only thing inpatient has done for him is to provide a place for him to stay. She says one of the reasons why he presents to the ED is because of his homelessness. John Copeland is in agreement for patient to be discharged to [facility] shelter. John Copeland says she is not concerned about safety.  Discussed recommendation for ACTT team with legal guardian. She says she will follow up with the recommendation.   Support, encouragement and reassurance provided about ongoing stressors and patient provided with opportunity for questions.    Patient does not meet inpatient admission criteria.  Transition of care consult order in place for bus pass/transport to shelter.   Discussed methods to reduce the risk of self-injury or suicide attempts: Frequent conversations regarding unsafe thoughts. Remove all significant sharps. Remove all firearms. Remove all medications, including over-the-counter meds. Consider lockbox for medications and having a responsible person dispense medications until patient has strengthened coping skills. Room checks for sharps or other harmful objects. Secure all chemical substances that can be ingested or inhaled.   Please refrain from using alcohol or illicit substances, as they can affect your mood and can cause depression, anxiety or other concerning symptoms.  Alcohol can increase the chance that a person will make reckless decisions,  like attempting suicide, and can increase the lethality of a drug overdose.      Discussed crisis plan, calling 911, or going to the ED if condition changes or worsens. Patient verbalized understanding.    Principal Problem: Schizoaffective disorder, bipolar type  ([place]) Diagnosis:  Principal Problem:   Schizoaffective disorder, bipolar type ([place]) Active Problems:   Schizophrenia [facility])   ED Assessment Time Calculation: 45 minutes  Past Psychiatric History: Schizoaffective disorder, tobacco use disorder, schizophrenia, accidental drug overdose, suicidal thoughts, alcohol use disorder, ADHD, bipolar disorder.  [place] Scale:  [place] ED from 07/19/2022 in [facility] Emergency Department at [facility] Most recent reading at 07/20/2022  3:38 AM ED from 07/18/2022 in [facility] Emergency Department at [facility] Most recent reading at 07/18/2022  4:00 PM ED from 07/18/2022 in [facility] Emergency Department at [facility] Most recent reading at 07/18/2022  9:39 AM  C-SSRS RISK CATEGORY Error: Question 1 not populated No Risk No Risk       Past Medical History:  Past Medical History:  Diagnosis Date   Anxiety    Depression    Diabetes mellitus without complication ([place])    Homelessness    Hypertension    Schizophrenia ([place])    No past surgical history on file. Family History:  Family History  Problem Relation Age of Onset   Diabetes Other    Family Psychiatric  History: See note Social History:  Social History   Substance and Sexual Activity  Alcohol Use Not Currently     Social History   Substance and Sexual Activity  Drug Use Not Currently   Types: Marijuana   Comment: denies drug use    Social History   Socioeconomic History   Marital status: Single    Spouse name: Not on file   Number of children: Not on file   Years of education: Not on file   Highest education level: Not on file  Occupational History   Not on file  Tobacco Use   Smoking status: Every Day    Packs/day: 0.00    Types: Cigarettes   Smokeless tobacco: Never  Vaping Use   Vaping Use: Never used  Substance and Sexual Activity   Alcohol use: Not Currently   Drug use: Not Currently    Types: Marijuana    Comment: denies  drug use   Sexual activity: Not Currently  Other Topics Concern   Not on file  Social History Narrative   Not on file   Social Determinants of Health   Financial Resource Strain: Not on file  Food Insecurity: Not on file  Transportation Needs: Not on file  Physical Activity: Not on file  Stress: Not on file  Social Connections: Not on file    Sleep: Fair  Appetite:  Good  Current Medications: Current Facility-Administered Medications  Medication Dose Route Frequency Provider Last Rate Last Admin   hydrOXYzine (ATARAX) tablet 25 mg  25 mg Oral QHS Prosperi, Christian H, PA-C   25 mg at 07/19/22 2144   Current Outpatient Medications  Medication Sig Dispense Refill   ABILIFY MAINTENA 400 MG PRSY prefilled syringe Inject 400 mg into the muscle every 28 (twenty-eight) days. (Patient not taking: Reported on 07/20/2022)     amLODipine (NORVASC) 5 MG tablet Take 1 tablet (5 mg total) by mouth daily. (Patient not taking: Reported on 09/22/2021) 30 tablet 0   ARIPiprazole (ABILIFY) 10 MG tablet Take 1 tablet (10 mg total) by mouth  daily. (Patient not taking: Reported on 07/20/2022) 30 tablet 0   atomoxetine (STRATTERA) 25 MG capsule Take 25 mg by mouth daily. (Patient not taking: Reported on 07/20/2022)     buPROPion (WELLBUTRIN XL) 150 MG 24 hr tablet Take 150 mg by mouth daily. (Patient not taking: Reported on 09/22/2021)     hydrOXYzine (ATARAX) 25 MG tablet Take 1 tablet (25 mg total) by mouth at bedtime. (Patient not taking: Reported on 07/20/2022) 12 tablet 0    Lab Results:  Results for orders placed or performed during the hospital encounter of 07/19/22 (from the past 48 hour(s))  Comprehensive metabolic panel     Status: Abnormal   Collection Time: 07/19/22  9:04 PM  Result Value Ref Range   Sodium 138 135 - 145 mmol/L   Potassium 4.4 3.5 - 5.1 mmol/L   Chloride 103 98 - 111 mmol/L   CO2 23 22 - 32 mmol/L   Glucose, Bld 92 70 - 99 mg/dL    Comment: Glucose reference range applies  only to samples taken after fasting for at least 8 hours.   BUN 15 6 - 20 mg/dL   Creatinine, Ser 0.86 0.61 - 1.24 mg/dL   Calcium 9.3 8.9 - 10.3 mg/dL   Total Protein 7.1 6.5 - 8.1 g/dL   Albumin 3.9 3.5 - 5.0 g/dL   AST 47 (H) 15 - 41 U/L   ALT 29 0 - 44 U/L   Alkaline Phosphatase 46 38 - 126 U/L   Total Bilirubin 0.6 0.3 - 1.2 mg/dL   GFR, Estimated >60 >60 mL/min    Comment: (NOTE) Calculated using the CKD-EPI Creatinine Equation (2021)    Anion gap 12 5 - 15    Comment: Performed at [facility] [address], [place], [place] [address]  Ethanol     Status: Abnormal   Collection Time: 07/19/22  9:04 PM  Result Value Ref Range   Alcohol, Ethyl (B) 55 (H) <10 mg/dL    Comment: (NOTE) Lowest detectable limit for serum alcohol is 10 mg/dL.  For medical purposes only. Performed at [facility], [place] [address], [place], [place] [address]   cbc     Status: Abnormal   Collection Time: 07/19/22  9:04 PM  Result Value Ref Range   WBC 7.7 4.0 - 10.5 K/uL   RBC 5.46 4.22 - 5.81 MIL/uL   Hemoglobin 12.8 (L) 13.0 - 17.0 g/dL   HCT 39.6 39.0 - 52.0 %   MCV 72.5 (L) 80.0 - 100.0 fL   MCH 23.4 (L) 26.0 - 34.0 pg   MCHC 32.3 30.0 - 36.0 g/dL   RDW 15.4 11.5 - 15.5 %   Platelets 312 150 - 400 K/uL   nRBC 0.0 0.0 - 0.2 %    Comment: Performed at [facility], [place] [address], [place], [place] [id number]  Salicylate level     Status: Abnormal   Collection Time: 07/19/22  9:04 PM  Result Value Ref Range   Salicylate Lvl Q000111Q (L) 7.0 - 30.0 mg/dL    Comment: Performed at [facility] [address], [place], [place] [address]  Acetaminophen level     Status: Abnormal   Collection Time: 07/19/22  9:04 PM  Result Value Ref Range   Acetaminophen (Tylenol), Serum <10 (L) 10 - 30 ug/mL    Comment: (NOTE) Therapeutic concentrations vary significantly. A range of 10-30 ug/mL  may be an effective concentration for many patients. However,  some  are best treated at  concentrations outside of this range. Acetaminophen concentrations >150 ug/mL at 4 hours after ingestion  and >50 ug/mL at 12 hours after ingestion are often associated with  toxic reactions.  Performed at [facility], [place] [address], [place], [place] [address]   Rapid urine drug screen (hospital performed)     Status: None   Collection Time: 07/19/22  9:06 PM  Result Value Ref Range   Opiates NONE DETECTED NONE DETECTED   Cocaine NONE DETECTED NONE DETECTED   Benzodiazepines NONE DETECTED NONE DETECTED   Amphetamines NONE DETECTED NONE DETECTED   Tetrahydrocannabinol NONE DETECTED NONE DETECTED   Barbiturates NONE DETECTED NONE DETECTED    Comment: (NOTE) DRUG SCREEN FOR MEDICAL PURPOSES ONLY.  IF CONFIRMATION IS NEEDED FOR ANY PURPOSE, NOTIFY LAB WITHIN 5 DAYS.  LOWEST DETECTABLE LIMITS FOR URINE DRUG SCREEN Drug Class                     Cutoff (ng/mL) Amphetamine and metabolites    1000 Barbiturate and metabolites    200 Benzodiazepine                 200 Opiates and metabolites        300 Cocaine and metabolites        300 THC                            50 Performed at [facility], [place] [address] [place], [place] [address]     Blood Alcohol level:  Lab Results  Component Value Date   ETH 55 (H) 07/19/2022   ETH <10 07/18/2022    Physical Findings:  CIWA: N/a   COWS: N/A   Musculoskeletal: Strength & Muscle Tone: within normal limits Gait & Station: normal Patient leans: N/A  Psychiatric Specialty Exam:  Presentation  General Appearance:  Appropriate for Environment  Eye Contact: Fair  Speech: Clear and Coherent  Speech Volume: Normal  Handedness: Right   Mood and Affect  Mood: Dysphoric  Affect: Congruent   Thought Process  Thought Processes: Coherent  Descriptions of Associations:Intact  Orientation:Full (Time, Place and Person)  Thought Content:Logical  History of Schizophrenia/Schizoaffective  disorder:Yes  Duration of Psychotic Symptoms:No data recorded Hallucinations:Hallucinations: None  Ideas of Reference:None  Suicidal Thoughts:Suicidal Thoughts: No  Homicidal Thoughts:Homicidal Thoughts: No   Sensorium  Memory: Immediate Fair; Recent Fair; Remote Fair  Judgment: Poor  Insight: Present   Executive Functions  Concentration: Fair  Attention Span: Fair  Recall: [employer] of Knowledge: Fair  Language: Fair   Psychomotor Activity  Psychomotor Activity: Psychomotor Activity: Normal   Assets  Assets: Communication Skills; Physical Health; Desire for Improvement   Sleep  Sleep: Sleep: Fair    Physical Exam: Physical Exam Vitals and nursing note reviewed.  Eyes:     General:        Right eye: No discharge.        Left eye: No discharge.  Pulmonary:     Effort: No respiratory distress.     Breath sounds: No wheezing.  Neurological:     Mental Status: He is alert and oriented to person, place, and time.     Motor: No weakness.  Psychiatric:        Attention and Perception: He does not perceive auditory or visual hallucinations.        Mood and Affect: Mood normal.  Mood is not anxious.        Speech: Speech normal.        Behavior: Behavior is cooperative.        Thought Content: Thought content is not paranoid. Thought content does not include homicidal or suicidal ideation.        Cognition and Memory: Cognition normal.    Review of Systems  Constitutional:  Negative for fever and malaise/fatigue.  HENT:  Negative for congestion, hearing loss and sore throat.   Eyes:  Negative for discharge and redness.  Respiratory:  Negative for cough, shortness of breath and wheezing.   Cardiovascular:  Negative for chest pain and palpitations.  Gastrointestinal:  Negative for abdominal pain, nausea and vomiting.  Musculoskeletal:  Negative for neck pain.  Skin:  Negative for rash.  Neurological:  Negative for dizziness, seizures and  weakness.  Psychiatric/Behavioral:  Negative for depression, substance abuse and suicidal ideas.    Blood pressure 139/88, pulse 72, temperature 98 F (36.7 C), temperature source Oral, resp. rate 18, SpO2 98 %. There is no height or weight on file to calculate BMI.   Medical Decision Making: Patient will be discharged to [facility] shelter. Patient's legal guardian is in agreement with this plan.   Earney Mallet, NP 07/20/2022, 11:15 AM
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
## 2022-07-22 ENCOUNTER — Emergency Department (HOSPITAL_COMMUNITY)
Admission: EM | Admit: 2022-07-22 | Discharge: 2022-07-22 | Disposition: A | Discharge status: Other Acute Inpatient Hospital | Payer: No Typology Code available for payment source | Attending: Emergency Medicine | Admitting: Emergency Medicine

## 2022-07-22 ENCOUNTER — Ambulatory Visit (HOSPITAL_COMMUNITY)
Admission: EM | Admit: 2022-07-22 | Discharge: 2022-07-23 | Disposition: A | Discharge status: Home / Self Care | Payer: No Typology Code available for payment source | Attending: Family | Admitting: Family

## 2022-07-22 DIAGNOSIS — E119 Type 2 diabetes mellitus without complications: Secondary | ICD-10-CM | POA: Insufficient documentation

## 2022-07-22 DIAGNOSIS — F250 Schizoaffective disorder, bipolar type: Secondary | ICD-10-CM | POA: Diagnosis present

## 2022-07-22 DIAGNOSIS — Z79899 Other long term (current) drug therapy: Secondary | ICD-10-CM | POA: Insufficient documentation

## 2022-07-22 DIAGNOSIS — I10 Essential (primary) hypertension: Secondary | ICD-10-CM | POA: Diagnosis not present

## 2022-07-22 DIAGNOSIS — F22 Delusional disorders: Secondary | ICD-10-CM

## 2022-07-22 DIAGNOSIS — Z1152 Encounter for screening for COVID-19: Secondary | ICD-10-CM | POA: Insufficient documentation

## 2022-07-22 DIAGNOSIS — F17210 Nicotine dependence, cigarettes, uncomplicated: Secondary | ICD-10-CM | POA: Diagnosis not present

## 2022-07-22 LAB — COMPREHENSIVE METABOLIC PANEL
ALT: 28 U/L (ref 0–44)
AST: 40 U/L (ref 15–41)
Albumin: 4.6 g/dL (ref 3.5–5.0)
Alkaline Phosphatase: 56 U/L (ref 38–126)
Anion gap: 11 (ref 5–15)
BUN: 22 mg/dL — ABNORMAL HIGH (ref 6–20)
CO2: 23 mmol/L (ref 22–32)
Calcium: 9.6 mg/dL (ref 8.9–10.3)
Chloride: 105 mmol/L (ref 98–111)
Creatinine, Ser: 1.12 mg/dL (ref 0.61–1.24)
GFR, Estimated: >60 mL/min (ref >60)
Glucose, Bld: 82 mg/dL (ref 70–99)
Potassium: 4.7 mmol/L (ref 3.5–5.1)
Sodium: 139 mmol/L (ref 135–145)
Total Bilirubin: 1 mg/dL (ref 0.3–1.2)
Total Protein: 8 g/dL (ref 6.5–8.1)

## 2022-07-22 LAB — RESP PANEL BY RT-PCR (RSV, FLU A&B, COVID)  RVPGX2
Influenza A by PCR: NEGATIVE
Influenza B by PCR: NEGATIVE
Resp Syncytial Virus by PCR: NEGATIVE
SARS Coronavirus 2 by RT PCR: NEGATIVE

## 2022-07-22 LAB — CBC WITH DIFFERENTIAL/PLATELET
Abs Immature Granulocytes: 0.04 10*3/uL (ref 0.00–0.07)
Basophils Absolute: 0.1 10*3/uL (ref 0.0–0.1)
Basophils Relative: 1 %
Eosinophils Absolute: 0 10*3/uL (ref 0.0–0.5)
Eosinophils Relative: 0 %
HCT: 42.8 % (ref 39.0–52.0)
Hemoglobin: 13.2 g/dL (ref 13.0–17.0)
Immature Granulocytes: 0 %
Lymphocytes Relative: 17 %
Lymphs Abs: 1.8 10*3/uL (ref 0.7–4.0)
MCH: 23.2 pg — ABNORMAL LOW (ref 26.0–34.0)
MCHC: 30.8 g/dL (ref 30.0–36.0)
MCV: 75.2 fL — ABNORMAL LOW (ref 80.0–100.0)
Monocytes Absolute: 0.9 10*3/uL (ref 0.1–1.0)
Monocytes Relative: 9 %
Neutro Abs: 7.5 10*3/uL (ref 1.7–7.7)
Neutrophils Relative %: 73 %
Platelets: 332 10*3/uL (ref 150–400)
RBC: 5.69 MIL/uL (ref 4.22–5.81)
RDW: 15.6 % — ABNORMAL HIGH (ref 11.5–15.5)
WBC: 10.3 10*3/uL (ref 4.0–10.5)
nRBC: 0 % (ref 0.0–0.2)

## 2022-07-22 LAB — RAPID URINE DRUG SCREEN, HOSP PERFORMED
Amphetamines: NOT DETECTED
Barbiturates: NOT DETECTED
Benzodiazepines: NOT DETECTED
Cocaine: NOT DETECTED
Opiates: NOT DETECTED
Tetrahydrocannabinol: NOT DETECTED

## 2022-07-22 LAB — ETHANOL: Alcohol, Ethyl (B): <10 mg/dL (ref <10)

## 2022-07-22 MED ORDER — ACETAMINOPHEN 325 MG PO TABS: 650.0000 mg | ORAL_TABLET | ORAL | Status: DC | PRN

## 2022-07-22 MED ORDER — ACETAMINOPHEN 325 MG PO TABS: 650.0000 mg | ORAL_TABLET | Freq: Four times a day (QID) | ORAL | Status: DC | PRN

## 2022-07-22 MED ORDER — HYDROXYZINE HCL 25 MG PO TABS: 25.0000 mg | ORAL_TABLET | Freq: Every day | ORAL | Status: DC

## 2022-07-22 MED ORDER — ATOMOXETINE HCL 25 MG PO CAPS: 25.0000 mg | ORAL_CAPSULE | Freq: Every day | ORAL | Status: DC

## 2022-07-22 MED ORDER — ONDANSETRON HCL 4 MG PO TABS: 4.0000 mg | ORAL_TABLET | Freq: Three times a day (TID) | ORAL | Status: DC | PRN

## 2022-07-22 MED ORDER — AMLODIPINE BESYLATE 5 MG PO TABS
5.0000 mg | ORAL_TABLET | Freq: Every day | ORAL | Status: DC
  Administered 2022-07-22: 5 mg via ORAL
  Filled 2022-07-22: qty 1

## 2022-07-22 MED ORDER — MAGNESIUM HYDROXIDE 400 MG/5ML PO SUSP: 30.0000 mL | Freq: Every day | ORAL | Status: DC | PRN

## 2022-07-22 MED ORDER — ARIPIPRAZOLE ER 400 MG IM PRSY: 400.0000 mg | PREFILLED_SYRINGE | INTRAMUSCULAR | Status: DC

## 2022-07-22 MED ORDER — ALUM & MAG HYDROXIDE-SIMETH 200-200-20 MG/5ML PO SUSP: 30.0000 mL | Freq: Four times a day (QID) | ORAL | Status: DC | PRN

## 2022-07-22 MED ORDER — BUPROPION HCL ER (XL) 150 MG PO TB24
150.0000 mg | ORAL_TABLET | Freq: Every day | ORAL | Status: DC
  Administered 2022-07-22: 150 mg via ORAL
  Filled 2022-07-22: qty 1

## 2022-07-22 MED ORDER — NICOTINE 7 MG/24HR TD PT24: 7.0000 mg | MEDICATED_PATCH | Freq: Every day | TRANSDERMAL | Status: DC

## 2022-07-22 MED ORDER — ALUM & MAG HYDROXIDE-SIMETH 200-200-20 MG/5ML PO SUSP: 30.0000 mL | ORAL | Status: DC | PRN

## 2022-07-22 MED ORDER — ARIPIPRAZOLE 10 MG PO TABS
10.0000 mg | ORAL_TABLET | Freq: Every day | ORAL | Status: DC
  Administered 2022-07-22: 10 mg via ORAL
  Filled 2022-07-22: qty 1

## 2022-07-22 NOTE — ED Notes (Signed)
Pt sleeping@this time. Breathing even and unlabored. Will continue to monitor for safety 

## 2022-07-22 NOTE — ED Notes (Signed)
1 bag of patient's belongings in 16-18 cabinet.   1 gray jacket 1 white long sleeve button up shirt 1 pair of gray dress pants 1 pair of black shoes 1 pair of socks 1 cellphone and charger
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
## 2022-07-22 NOTE — ED Notes (Signed)
Pt A&O x 4, sleeping at present, no distress noted.  Denies SI.HI or AVH.  Monitoring for safety.
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
## 2022-07-22 NOTE — ED Notes (Signed)
Pt belongings moved to locker #28.

## 2022-07-22 NOTE — ED Provider Notes (Signed)
8:25 AM Assumed care of patient from off-going team. For more details, please see note from same day.  In brief, this is a 32 y.o. male with h/o schizophrenia, diabetes, HTN, and homelessness who p/ wparanoid delusions. Labs pending, TTS was consulted.   Plan/Dispo at time of sign-out & ED Course since sign-out: '[ ]'$  review labs  BP (!) 147/100 (BP Location: Left Arm)   Pulse 96   Temp 98.6 F (37 C) (Oral)   Resp 20   SpO2 99%    ED Course:   Clinical Course as of 07/22/22 0825  Sun Jul 22, 2022  0824 EtOH neg, UDS neg, glucose 82, CMP unremarkable, CBC with no acute findings. Patient medically cleared, now pending evaluation by TTS. Home meds were already ordered. [HN]    Clinical Course User Index [HN] Audley Hose, MD    Dispo: Pending TTS ------------------------------- Cindee Lame, MD Emergency Medicine  This note was created using dictation software, which may contain spelling or grammatical errors.   Audley Hose, MD 07/22/22 [phone]

## 2022-07-22 NOTE — ED Notes (Signed)
Patient  sleeping in no acute stress. RR even and unlabored .Environment secured .Will continue to monitor for safely. 

## 2022-07-22 NOTE — ED Notes (Signed)
Pt sleeping'@this'$  time. Breathign even and unlabored. Will contiue

## 2022-07-22 NOTE — ED Notes (Signed)
Pt transferred from [facility] denies SI/HI/AVH. Calm, cooperative throughout interview process. Skin assessment completed. Oriented to unit. Meal and drink offered. At [place], pt continue to denies SI/HI/AVH. Pt verbally contract for safety. Will monitor for safety.

## 2022-07-22 NOTE — Discharge Instructions (Addendum)

## 2022-07-23 ENCOUNTER — Encounter (HOSPITAL_COMMUNITY): Payer: Self-pay | Admitting: Registered Nurse

## 2022-07-23 MED ORDER — ARIPIPRAZOLE ER 400 MG IM PRSY: 400.0000 mg | PREFILLED_SYRINGE | INTRAMUSCULAR | 0 refills | Status: AC

## 2022-07-23 NOTE — Discharge Summary (Signed)
Drucie Ip to be D/C'd Home per NP order. Discussed with the patient and all questions fully answered. An After Visit Summary was printed and given to the patient.  All belongings were given to patient. Patient escorted out and D/C home via safe transport.  Clois Dupes  07/23/2022 1:23 PM

## 2022-07-23 NOTE — Care Management (Signed)
OBS Care Management   Writer spoke  to legal guardian and she is in agreement with the patient being discharged to the homeless shelter and to have an appointment with the shot clinic at [facility].

## 2022-07-23 NOTE — ED Notes (Signed)
The pt sleeping'@this'$  time. Breathing even and unlabored. Will continue to monitor for safety
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
## 2022-07-23 NOTE — ED Notes (Signed)
Safe Transport called for transport to [facility].Marland Kitchen
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
## 2022-07-23 NOTE — ED Notes (Signed)
Pt is taking a shower.

## 2022-07-25 ENCOUNTER — Emergency Department (EMERGENCY_DEPARTMENT_HOSPITAL)
Admission: EM | Admit: 2022-07-25 | Discharge: 2022-07-27 | Disposition: A | Discharge status: Home / Self Care | Payer: Medicaid Other | Source: Home / Self Care | Attending: Emergency Medicine | Admitting: Emergency Medicine

## 2022-07-25 ENCOUNTER — Encounter (HOSPITAL_COMMUNITY): Payer: Self-pay

## 2022-07-25 ENCOUNTER — Other Ambulatory Visit: Payer: Self-pay

## 2022-07-25 ENCOUNTER — Emergency Department (HOSPITAL_COMMUNITY)
Admission: EM | Admit: 2022-07-25 | Discharge: 2022-07-25 | Discharge status: Against Medical Advice | Payer: Medicaid Other | Attending: Emergency Medicine | Admitting: Emergency Medicine

## 2022-07-25 ENCOUNTER — Ambulatory Visit (HOSPITAL_COMMUNITY): Payer: No Typology Code available for payment source

## 2022-07-25 DIAGNOSIS — Z5321 Procedure and treatment not carried out due to patient leaving prior to being seen by health care provider: Secondary | ICD-10-CM | POA: Diagnosis not present

## 2022-07-25 DIAGNOSIS — F411 Generalized anxiety disorder: Secondary | ICD-10-CM | POA: Insufficient documentation

## 2022-07-25 DIAGNOSIS — F309 Manic episode, unspecified: Secondary | ICD-10-CM | POA: Insufficient documentation

## 2022-07-25 DIAGNOSIS — F17210 Nicotine dependence, cigarettes, uncomplicated: Secondary | ICD-10-CM | POA: Insufficient documentation

## 2022-07-25 DIAGNOSIS — I10 Essential (primary) hypertension: Secondary | ICD-10-CM | POA: Insufficient documentation

## 2022-07-25 DIAGNOSIS — E119 Type 2 diabetes mellitus without complications: Secondary | ICD-10-CM | POA: Insufficient documentation

## 2022-07-25 DIAGNOSIS — T50901A Poisoning by unspecified drugs, medicaments and biological substances, accidental (unintentional), initial encounter: Secondary | ICD-10-CM | POA: Insufficient documentation

## 2022-07-25 DIAGNOSIS — F250 Schizoaffective disorder, bipolar type: Secondary | ICD-10-CM | POA: Insufficient documentation

## 2022-07-25 DIAGNOSIS — D649 Anemia, unspecified: Secondary | ICD-10-CM | POA: Diagnosis not present

## 2022-07-25 DIAGNOSIS — F3010 Manic episode without psychotic symptoms, unspecified: Secondary | ICD-10-CM

## 2022-07-25 LAB — URINALYSIS, ROUTINE W REFLEX MICROSCOPIC
Bilirubin Urine: NEGATIVE
Glucose, UA: NEGATIVE mg/dL
Hgb urine dipstick: NEGATIVE
Ketones, ur: NEGATIVE mg/dL
Leukocytes,Ua: NEGATIVE
Nitrite: NEGATIVE
Protein, ur: NEGATIVE mg/dL
Specific Gravity, Urine: 1.016 (ref 1.005–1.030)
pH: 5 (ref 5.0–8.0)

## 2022-07-25 LAB — CBC WITH DIFFERENTIAL/PLATELET
Abs Immature Granulocytes: 0.01 10*3/uL (ref 0.00–0.07)
Abs Immature Granulocytes: 0.02 10*3/uL (ref 0.00–0.07)
Basophils Absolute: 0.1 10*3/uL (ref 0.0–0.1)
Basophils Absolute: 0.1 10*3/uL (ref 0.0–0.1)
Basophils Relative: 1 %
Basophils Relative: 1 %
Eosinophils Absolute: 0 10*3/uL (ref 0.0–0.5)
Eosinophils Absolute: 0.1 10*3/uL (ref 0.0–0.5)
Eosinophils Relative: 0 %
Eosinophils Relative: 1 %
HCT: 40.6 % (ref 39.0–52.0)
HCT: 39.6 % (ref 39.0–52.0)
Hemoglobin: 12.8 g/dL — ABNORMAL LOW (ref 13.0–17.0)
Hemoglobin: 13.3 g/dL (ref 13.0–17.0)
Immature Granulocytes: 0 %
Immature Granulocytes: 0 %
Lymphocytes Relative: 26 %
Lymphocytes Relative: 21 %
Lymphs Abs: 2 10*3/uL (ref 0.7–4.0)
Lymphs Abs: 1.8 10*3/uL (ref 0.7–4.0)
MCH: 23.6 pg — ABNORMAL LOW (ref 26.0–34.0)
MCH: 24.6 pg — ABNORMAL LOW (ref 26.0–34.0)
MCHC: 31.5 g/dL (ref 30.0–36.0)
MCHC: 33.6 g/dL (ref 30.0–36.0)
MCV: 74.8 fL — ABNORMAL LOW (ref 80.0–100.0)
MCV: 73.3 fL — ABNORMAL LOW (ref 80.0–100.0)
Monocytes Absolute: 0.6 10*3/uL (ref 0.1–1.0)
Monocytes Absolute: 0.7 10*3/uL (ref 0.1–1.0)
Monocytes Relative: 7 %
Monocytes Relative: 8 %
Neutro Abs: 5 10*3/uL (ref 1.7–7.7)
Neutro Abs: 6 10*3/uL (ref 1.7–7.7)
Neutrophils Relative %: 66 %
Neutrophils Relative %: 69 %
Platelets: 330 10*3/uL (ref 150–400)
Platelets: 344 10*3/uL (ref 150–400)
RBC: 5.43 MIL/uL (ref 4.22–5.81)
RBC: 5.4 MIL/uL (ref 4.22–5.81)
RDW: 15.4 % (ref 11.5–15.5)
RDW: 15.4 % (ref 11.5–15.5)
WBC: 7.7 10*3/uL (ref 4.0–10.5)
WBC: 8.7 10*3/uL (ref 4.0–10.5)
nRBC: 0 % (ref 0.0–0.2)
nRBC: 0 % (ref 0.0–0.2)

## 2022-07-25 LAB — COMPREHENSIVE METABOLIC PANEL
ALT: 28 U/L (ref 0–44)
ALT: 29 U/L (ref 0–44)
AST: 40 U/L (ref 15–41)
AST: 40 U/L (ref 15–41)
Albumin: 4 g/dL (ref 3.5–5.0)
Albumin: 4.3 g/dL (ref 3.5–5.0)
Alkaline Phosphatase: 50 U/L (ref 38–126)
Alkaline Phosphatase: 50 U/L (ref 38–126)
Anion gap: 7 (ref 5–15)
Anion gap: 7 (ref 5–15)
BUN: 21 mg/dL — ABNORMAL HIGH (ref 6–20)
BUN: 24 mg/dL — ABNORMAL HIGH (ref 6–20)
CO2: 27 mmol/L (ref 22–32)
CO2: 21 mmol/L — ABNORMAL LOW (ref 22–32)
Calcium: 9.5 mg/dL (ref 8.9–10.3)
Calcium: 9 mg/dL (ref 8.9–10.3)
Chloride: 103 mmol/L (ref 98–111)
Chloride: 105 mmol/L (ref 98–111)
Creatinine, Ser: 1.06 mg/dL (ref 0.61–1.24)
Creatinine, Ser: 0.78 mg/dL (ref 0.61–1.24)
GFR, Estimated: >60 mL/min (ref >60)
GFR, Estimated: >60 mL/min (ref >60)
Glucose, Bld: 75 mg/dL (ref 70–99)
Glucose, Bld: 83 mg/dL (ref 70–99)
Potassium: 4 mmol/L (ref 3.5–5.1)
Potassium: 4 mmol/L (ref 3.5–5.1)
Sodium: 137 mmol/L (ref 135–145)
Sodium: 133 mmol/L — ABNORMAL LOW (ref 135–145)
Total Bilirubin: 0.8 mg/dL (ref 0.3–1.2)
Total Bilirubin: 0.8 mg/dL (ref 0.3–1.2)
Total Protein: 7.1 g/dL (ref 6.5–8.1)
Total Protein: 7.8 g/dL (ref 6.5–8.1)

## 2022-07-25 LAB — RAPID URINE DRUG SCREEN, HOSP PERFORMED
Amphetamines: NOT DETECTED
Amphetamines: NOT DETECTED
Barbiturates: NOT DETECTED
Barbiturates: NOT DETECTED
Benzodiazepines: NOT DETECTED
Benzodiazepines: NOT DETECTED
Cocaine: NOT DETECTED
Cocaine: NOT DETECTED
Opiates: NOT DETECTED
Opiates: NOT DETECTED
Tetrahydrocannabinol: NOT DETECTED
Tetrahydrocannabinol: NOT DETECTED

## 2022-07-25 LAB — ACETAMINOPHEN LEVEL: Acetaminophen (Tylenol), Serum: <10 ug/mL — ABNORMAL LOW (ref 10–30)

## 2022-07-25 LAB — ETHANOL: Alcohol, Ethyl (B): <10 mg/dL (ref <10)

## 2022-07-25 LAB — SALICYLATE LEVEL: Salicylate Lvl: <7 mg/dL — ABNORMAL LOW (ref 7.0–30.0)

## 2022-07-25 MED ORDER — AMLODIPINE BESYLATE 5 MG PO TABS: 5.0000 mg | ORAL_TABLET | Freq: Once | ORAL | Status: DC

## 2022-07-25 MED ORDER — ACETAMINOPHEN 325 MG PO TABS: 650.0000 mg | ORAL_TABLET | ORAL | Status: DC | PRN

## 2022-07-25 MED ORDER — LORAZEPAM 1 MG PO TABS
1.0000 mg | ORAL_TABLET | Freq: Once | ORAL | Status: AC
  Administered 2022-07-25: 1 mg via ORAL
  Filled 2022-07-25: qty 1

## 2022-07-25 MED ORDER — ONDANSETRON HCL 4 MG PO TABS: 4.0000 mg | ORAL_TABLET | Freq: Three times a day (TID) | ORAL | Status: DC | PRN

## 2022-07-25 NOTE — ED Notes (Addendum)
Pt eloped, relays he his not wait to dc or provider update. Pt did agree to take Iv out before he left. Pt educated on need to stay, Pa notified.

## 2022-07-25 NOTE — ED Notes (Signed)
Pt. In burgundy scrubs and wanded by security.pt. has 1 belongings bag locked up in cabinet 16-18 behind the nurses station. Pt. Has 1 white shirt, 1 pr black shoes, 1 black pants, and 1 pr black socks and bible at bedside. Pt. Has no cell phone or wallet.

## 2022-07-25 NOTE — ED Provider Notes (Signed)
[facility] Provider Note   CSN: [id number] Arrival date & time: 07/25/22  1301     History Chief Complaint  Patient presents with   Drug Overdose    John Copeland is a 32 y.o. male with history of diabetes, schizophrenia, alcohol use disorder, hypertension presents the emergency room today for evaluation after taking too much Coricidin. The patient reports that he has been taking Coricidin since he was 37. He reports he usually takes a box or half a box. He denies any SI or HI. He reports he does it because it gives him energy. He was brought in via EMS. The patient reports that he wanted to stop using the medication so he came into the ER.  He denies any chest pain or shortness of breath.  Denies abdominal pain, nausea, vomiting, or syncope.   Drug Overdose Pertinent negatives include no chest pain, no abdominal pain and no shortness of breath.       Home Medications Prior to Admission medications   Medication Sig Start Date End Date Taking? Authorizing Provider  ARIPiprazole ER (ABILIFY MAINTENA) 400 MG PRSY prefilled syringe Inject 400 mg into the muscle every 28 (twenty-eight) days. Please bring this medication to your scheduled appointment at [facility] on 07/25/2022 at 2:00 PM 07/23/22   Rankin, Shuvon B, NP      Allergies    Risperidone    Review of Systems   Review of Systems  Constitutional:  Negative for chills and fever.  Respiratory:  Negative for shortness of breath.   Cardiovascular:  Negative for chest pain and palpitations.  Gastrointestinal:  Negative for abdominal pain, nausea and vomiting.  Neurological:  Negative for syncope.  Psychiatric/Behavioral:  Negative for self-injury.     Physical Exam Updated Vital Signs BP (!) 155/84 (BP Location: Right Arm)   Pulse 88   Temp 98.5 F (36.9 C) (Oral)   Resp 16   Ht 6' (1.829 m)   Wt 81 kg   SpO2 98%   BMI 24.22 kg/m  Physical  Exam Vitals and nursing note reviewed.  Constitutional:      General: He is not in acute distress.    Appearance: Normal appearance. He is not toxic-appearing.  Eyes:     General: No scleral icterus. Pulmonary:     Effort: Pulmonary effort is normal. No respiratory distress.  Skin:    General: Skin is dry.  Neurological:     General: No focal deficit present.     Mental Status: He is alert. Mental status is at baseline.  Psychiatric:        Behavior: Behavior is cooperative.        Thought Content: Thought content does not include homicidal or suicidal ideation. Thought content does not include homicidal or suicidal plan.     ED Results / Procedures / Treatments   Labs (all labs ordered are listed, but only abnormal results are displayed) Labs Reviewed  CBC WITH DIFFERENTIAL/PLATELET - Abnormal; Notable for the following components:      Result Value   Hemoglobin 12.8 (*)    MCV 74.8 (*)    MCH 23.6 (*)    All other components within normal limits  COMPREHENSIVE METABOLIC PANEL - Abnormal; Notable for the following components:   BUN 21 (*)    All other components within normal limits  ACETAMINOPHEN LEVEL - Abnormal; Notable for the following components:   Acetaminophen (Tylenol), Serum <10 (*)  All other components within normal limits  SALICYLATE LEVEL - Abnormal; Notable for the following components:   Salicylate Lvl [id number] (*)    All other components within normal limits  URINALYSIS, ROUTINE W REFLEX MICROSCOPIC  RAPID URINE DRUG SCREEN, HOSP PERFORMED    EKG None  Radiology No results found.  Procedures Procedures   Medications Ordered in ED Medications - No data to display  ED Course/ Medical Decision Making/ A&P                            Medical Decision Making Amount and/or Complexity of Data Reviewed Labs: ordered.   32 year old male presents the emergency room today for evaluation after taking too much Coricidin.  Differential diagnosis  includes but is not limited to prolonged QT, tachycardia, drug intoxication, seizures.  Vital signs show slightly elevated blood pressure 125/84 otherwise afebrile, no pulse rate, satting well on room air without increased work of breathing.  Physical exam as noted above.  Patient did not do this with the intention of SI.  From previous chart evaluation, patient was recently seen in the ER on 07-09-2022, 08-03-2020, and 08-01-2020 as well as 06-01-2017 and 05-20-2017.  This is not a new issue for him.  Continues to abuse this medication without suicidal ideation.  I independently reviewed and interpreted the patient's labs.  Acetaminophen and salicylate levels are negative.  CMP shows mildly increased BUN otherwise no electrolyte or LFT abnormality.  Urinalysis is unremarkable.  UDS is negative.  CBC shows mildly decreased anemia at 12.8 appears around his baseline.  No other leukocytosis or decrease in platelets.  EMS contacted poison control and they recommended observation for 6 hours as well as a repeat EKG.  Effects include seizures, prolonged QT, EKG changes.  Patient was alerted to this.  Unfortunately, was alerted by nursing the patient eloped.  I do not see the need for any IVC as he does not have any suicidal ideations and is a well-known history of using this drug.  Final Clinical Impression(s) / ED Diagnoses Final diagnoses:  Eloped from emergency department    Rx / DC Orders ED Discharge Orders     None         Sherrell Puller, PA-C 07/25/22 1802    Audley Hose, MD 07/26/22 [phone]

## 2022-07-25 NOTE — ED Triage Notes (Signed)
Pt BIBA from [facility]. Pt states they "smoked some marijuana and it wasn't good". Pt is anxious, and pacing around rm.  AOx4

## 2022-07-25 NOTE — ED Notes (Signed)
Bladder Scan 36ml 

## 2022-07-25 NOTE — ED Provider Notes (Addendum)
EMERGENCY DEPARTMENT AT [facility] Provider Note   CSN: [id number] Arrival date & time: 07/25/22  1842     History  Chief Complaint  Patient presents with   Manic Behavior    John Copeland is a 32 y.o. male. With pmh  schizophrenia, polysubstance abuse, HTN, alcohol use disorder, diabetes, h/o drug overdose, seizures who was BIB EMS from [facility] for "smoking some marijuana and it wasn't good".  This is his third time and in the ED today but of note he was being seen earlier for Coricidin ingestion.  He took 16 pills yesterday and 16 pills this morning his last dose was earlier this morning.  He has not taken any more today.  He denies any alcohol use or other drug ingestions he does not take his Abilify.  He was smoking marijuana which he normally does not do and felt very anxious and nervous and EMS was called on him by security near the [facility].  He denies any chest pain or shortness of breath, no difficulty going to the bathroom, no vomiting.  He denies any suicidal intent or homicidal intent.  He denies any hallucinations.  HPI     Home Medications Prior to Admission medications   Medication Sig Start Date End Date Taking? Authorizing Provider  ARIPiprazole ER (ABILIFY MAINTENA) 400 MG PRSY prefilled syringe Inject 400 mg into the muscle every 28 (twenty-eight) days. Please bring this medication to your scheduled appointment at [facility] on 07/25/2022 at 2:00 PM 07/23/22   Rankin, Shuvon B, NP      Allergies    Risperidone    Review of Systems   Review of Systems  Physical Exam Updated Vital Signs BP (!) 145/90 (BP Location: Left Arm)   Pulse 82   Temp 98.2 F (36.8 C) (Oral)   Resp 20   SpO2 98%  Physical Exam Constitutional: Alert and oriented.  Actively pacing around intermittently reading through bible Eyes: Conjunctivae are injected. [id number] ENT      Head: Normocephalic and atraumatic.      Mouth/Throat: Mucous membranes  are moist. Cardiovascular: S1, S2,  Normal and symmetric distal pulses are present in all extremities.Warm and well perfused. Respiratory: Normal respiratory effort. Breath sounds are normal. O2 sat 98 on RA Gastrointestinal: Soft and nontender.  Musculoskeletal: Normal range of motion in all extremities. Neurologic: Normal speech and language.  Steady ambulatory gait.  Moving all extremities equally.  Sensation grossly intact.  No gross focal neurologic deficits are appreciated. Skin: Skin is warm, dry  Psychiatric: pressured speech, flight of ideas,   ED Results / Procedures / Treatments   Labs (all labs ordered are listed, but only abnormal results are displayed) Labs Reviewed  CBC WITH DIFFERENTIAL/PLATELET - Abnormal; Notable for the following components:      Result Value   MCV 73.3 (*)    MCH 24.6 (*)    All other components within normal limits  COMPREHENSIVE METABOLIC PANEL - Abnormal; Notable for the following components:   Sodium 133 (*)    CO2 21 (*)    BUN 24 (*)    All other components within normal limits  ETHANOL  RAPID URINE DRUG SCREEN, HOSP PERFORMED    EKG EKG Interpretation  Date/Time:  Wednesday July 25 2022 20:33:20 EDT Ventricular Rate:  86 PR Interval:  159 QRS Duration: 100 QT Interval:  369 QTC Calculation: 442 R Axis:   261 Text Interpretation: Sinus rhythm Markedly posterior QRS axis No  significant change since last tracing Confirmed by Georgina Snell [phone]) on 07/25/2022 8:49:13 PM  Radiology No results found.  Procedures Procedures    Medications Ordered in ED Medications  LORazepam (ATIVAN) tablet 1 mg (1 mg Oral Given 07/25/22 2059)    ED Course/ Medical Decision Making/ A&P Clinical Course as of 07/25/22 2138  Wed Jul 25, 2022  [facility] from poison control, recommends rpt vitals but does not seem c/w overdose from coricidin at this point. Just supportive care, no further checks. [VB]    Clinical Course User  Index [VB] John Congo, MD                            Medical Decision Making  John Copeland is a 32 y.o. male. With pmh  schizophrenia, polysubstance abuse, HTN, alcohol use disorder, diabetes, h/o drug overdose, seizures who was BIB EMS from [facility] for "smoking some marijuana and it wasn't good".    Additionally, he was seen earlier today but has eloped 2 times for Coricidin ingestion (16 pills yesterday, 16 pills last this morning).  From previous chart evaluation, patient was recently seen in the ER on 07-09-2022, 08-03-2020, and 08-01-2020 as well as 06-01-2017 and 05-20-2017.  This is not a new issue for him.  Continues to abuse this medication without suicidal ideation.  Regarding course IN ingestion this would lead to anticholinergic symptoms.  On exam, patient does not appear to be consistent with anticholinergic intoxication/overdose.  He has had no seizures.  He is not hyperthermic.  His initial blood pressure was elevated as well as tachycardic but suspect this is more likely due to anxiety as this resolved on reassessment with reassuring blood pressure 145/90 heart rate 80.  He is peeing normally.  Having no retention.  His labs have been unremarkable.  His QTc within normal limits.  I spoke with [facility] who reviewed his symptoms with me and agrees that there are no signs of toxic overdose at this time.  He should be out of monitoring phase.  Patient is medically cleared by me.  Will have TTS/psych consult for manic behavior.  Amount and/or Complexity of Data Reviewed Labs: ordered.  Risk OTC drugs. Prescription drug management.    Final Clinical Impression(s) / ED Diagnoses Final diagnoses:  Manic behavior [facility])    Rx / DC Orders ED Discharge Orders     None         John Congo, MD 07/25/22 2138    John Congo, MD 07/25/22 2139

## 2022-07-25 NOTE — ED Triage Notes (Signed)
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

## 2022-07-25 NOTE — ED Provider Notes (Incomplete)
[facility] Provider Note   CSN: [id number] Arrival date & time: 07/25/22  1301     History {Add pertinent medical, surgical, social history, OB history to HPI:1} Chief Complaint  Patient presents with   Drug Overdose    John Copeland is a 32 y.o. male with schizophrenia, polysubstance abuse, HTN, alcohol use disorder, diabetes, h/o drug overdose, seizures who presents with intentional drug overdose.   Pt bib ems from a hotel; pt states he took 32 coricidin, 16 last night 16 this am; pt denies SI, pt states he is trying to increase his athletic performance; has been abusing coricidin since he was 32 years of age; hx schizophrenia, supposed to be on Abilify but does not take; HR 90s, BP 180/100, RR 18, clear lung sounds  ***  Coricidin (chlorpheniramine maleate, dextromethorphan)   Drug Overdose       Home Medications Prior to Admission medications   Medication Sig Start Date End Date Taking? Authorizing Provider  ARIPiprazole ER (ABILIFY MAINTENA) 400 MG PRSY prefilled syringe Inject 400 mg into the muscle every 28 (twenty-eight) days. Please bring this medication to your scheduled appointment at [facility] on 07/25/2022 at 2:00 PM 07/23/22   Rankin, Shuvon B, NP      Allergies    Risperidone    Review of Systems   Review of Systems Review of systems {pos/neg:18640::"Negative","Positive"} for ***.  A 10 point review of systems was performed and is negative unless otherwise reported in HPI.  Physical Exam Updated Vital Signs There were no vitals taken for this visit. Physical Exam General: Normal appearing {Desc; male/male:11659}, lying in bed.  HEENT: PERRLA, Sclera anicteric, MMM, trachea midline.  Cardiology: RRR, no murmurs/rubs/gallops. BL radial and DP pulses equal bilaterally.  Resp: Normal respiratory rate and effort. CTAB, no wheezes, rhonchi, crackles.  Abd: Soft, non-tender,  non-distended. No rebound tenderness or guarding.  GU: Deferred. MSK: No peripheral edema or signs of trauma. Extremities without deformity or TTP. No cyanosis or clubbing. Skin: warm, dry. No rashes or lesions. Back: No CVA tenderness Neuro: A&Ox4, CNs II-XII grossly intact. MAEs. Sensation grossly intact.  Psych: Normal mood and affect.   ED Results / Procedures / Treatments   Labs (all labs ordered are listed, but only abnormal results are displayed) Labs Reviewed - No data to display  EKG None  Radiology No results found.  Procedures Procedures  {Document cardiac monitor, telemetry assessment procedure when appropriate:1}  Medications Ordered in ED Medications - No data to display  ED Course/ Medical Decision Making/ A&P                          Medical Decision Making   This patient presents to the ED for concern of ***, this involves an extensive number of treatment options, and is a complaint that carries with it a high risk of complications and morbidity.  I considered the following differential and admission for this acute, potentially life threatening condition.   MDM:    ***     Labs: I Ordered, and personally interpreted labs.  The pertinent results include:  ***  Imaging Studies ordered: I ordered imaging studies including *** I independently visualized and interpreted imaging. I agree with the radiologist interpretation  Additional history obtained from ***.  External records from outside source obtained and reviewed including ***  Cardiac Monitoring: The patient was maintained on a cardiac monitor.  I personally  viewed and interpreted the cardiac monitored which showed an underlying rhythm of: ***  Reevaluation: After the interventions noted above, I reevaluated the patient and found that they have :{resolved/improved/worsened:23923::"improved"}  Social Determinants of Health: ***  Disposition:  ***  Co morbidities that complicate the  patient evaluation  Past Medical History:  Diagnosis Date   Anxiety    Depression    Diabetes mellitus without complication ([place])    Homelessness    Hypertension    Schizophrenia ([place])      Medicines No orders of the defined types were placed in this encounter.   I have reviewed the patients home medicines and have made adjustments as needed  Problem List / ED Course: Problem List Items Addressed This Visit   None        {Document critical care time when appropriate:1} {Document review of labs and clinical decision tools ie heart score, Chads2Vasc2 etc:1}  {Document your independent review of radiology images, and any outside records:1} {Document your discussion with family members, caretakers, and with consultants:1} {Document social determinants of health affecting pt's care:1} {Document your decision making why or why not admission, treatments were needed:1}  This note was created using dictation software, which may contain spelling or grammatical errors.

## 2022-07-26 DIAGNOSIS — F411 Generalized anxiety disorder: Secondary | ICD-10-CM

## 2022-07-26 NOTE — ED Notes (Signed)
Patient to 42. Patient wanded by security. Patient ambulated to room. Patient calm and cooperative. Patient oriented to room and unit.

## 2022-07-26 NOTE — Progress Notes (Addendum)
Per provider Michaele Offer, PMHNP pt has been psych cleared. TOC has been ordered and TOC CSW has provided community resources for pt. This CSW [facility]. TOC will assist with any discharge needs. No further action needed by psych CSW.    Benjaman Kindler, MSW, [facility] 07/26/2022 10:02 PM

## 2022-07-26 NOTE — Progress Notes (Addendum)
TOC CSW has attempted to contact pts legal guardian, Corey Harold [phone].  Unfortunately, Rebecca's vm states she is off today (07/26/2022).  Therefore, I attempted to contact Athena Kentch [phone].  Athena's name and number was on Rebecca's vm to contact if assistance is needed prior to her return.  CSW then contacted the main number for [facility] ([phone].  CSW spoke with Norton Blizzard.  Ruby Cola stated he would send both Wells Guiles and Chesley Noon an email notifying them of my phone call.  Pts prior state is that he is unhoused.  There is no group home.  Pt can return to his previous status.  Pt does not need to remain here for legal guardian to place pt in a group home.  CSW has provided pt with unhoused resources and DSS information.  Pt is familiar with [facility] and has been dc'd to [facility] previously.  [facility] is open 24/7.  Kaley Jutras Tarpley-Carter, MSW, LCSW-A Pronouns:  She/Her/Hers [facility] HealthTransitions of Care Clinical Social Worker Direct Number:  [phone] [email or web address]

## 2022-07-26 NOTE — Consult Note (Signed)
[facility] ED ASSESSMENT   Reason for Consult:  Psych Consult Referring Physician:  Dr. Nechama Guard Patient Identification: John Copeland MRN:  [id number] ED Chief Complaint: Anxiety state  Diagnosis:  Principal Problem:   Anxiety state Active Problems:   Schizoaffective disorder, bipolar type [facility])   ED Assessment Time Calculation: Start Time: 1300 Stop Time: 1340 Total Time in Minutes (Assessment Completion): 40   HPI: Per triage note: Pt BIBA from [facility]. Pt states they "smoked some marijuana and it wasn't good". Pt is anxious, and pacing around rm.    Subjective: Patient seen face to face by this provider, consulted with Dr. Hampton Abbot; and chart reviewed on 07/26/22.  On evaluation John Copeland reports that he came to the hospital, after hanging out at [facility], states he smoked some marijuana with some guys he knows, and it made him feel anxious.  Patient states he is not paranoid or delusional.  Patient denies SI/HI/AVH.  Patient states his appetite and sleep are fair, states he has been staying at the [facility].  Patient states [facility] and is asking if we can give him resources to go to a group home or to help him find a group home.  Patient states that if we cannot help him, he will have to go back and live on the streets, as he is homeless.  Patient states he went to the [employer], and they were unable to help him.  During evaluation John Copeland is lying in bed with no noted distress.  He is alert/oriented x 4, calm, cooperative, and attentive.  His responses were appropriate to assessment questions.  His mood is euthymic with congruent affect. He spoke in a clear tone at moderate volume, and normal pace, with good eye contact. He denies suicidal/self-harm/homicidal ideation, psychosis, and paranoia. Objectively:  there is no evidence of psychosis/mania or delusional thinking.  He conversed coherently, with goal directed  thoughts, and no distractibility, or pre-occupation.  Patient states that he smoked some that marijuana, UDS is negative, BAL is less than 10.  Patient states that his ACT team does not help him.  Patient has been in the [facility] ED system 9 times for psychiatric complaints since May 14, 2022.  Attempted to call patient's legal guardian Corey Harold, no answer left HIPAA compliant voicemail.     Past Psychiatric History: Schizophrenia  Risk to Self or Others: Is the patient at risk to self? No Has the patient been a risk to self in the past 6 months? No Has the patient been a risk to self within the distant past? No Is the patient a risk to others? No Has the patient been a risk to others in the past 6 months? No Has the patient been a risk to others within the distant past? Yes  [place] Scale:  [place] ED from 07/25/2022 in [facility] Emergency Department at [facility] Most recent reading at 07/25/2022  6:57 PM ED from 07/25/2022 in [facility] Emergency Department at [facility] Most recent reading at 07/25/2022  1:08 PM ED from 07/22/2022 in [facility] Most recent reading at 07/23/2022  8:19 AM  C-SSRS RISK CATEGORY Error: Q3, 4, or 5 should not be populated when Q2 is No Error: Question 6 not populated No Risk       AIMS:  , , ,  ,   ASAM:    Substance Abuse:  Past Medical History:  Past Medical History:  Diagnosis Date   Anxiety    Depression    Diabetes mellitus without complication ([address])    Homelessness    Hypertension    Schizophrenia ([place])    No past surgical history on file. Family History:  Family History  Problem Relation Age of Onset   Diabetes Other     Social History:  Social History   Substance and Sexual Activity  Alcohol Use Not Currently     Social History   Substance and Sexual Activity  Drug Use Not Currently   Types: Marijuana   Comment: denies drug use    Social History    Socioeconomic History   Marital status: Single    Spouse name: Not on file   Number of children: Not on file   Years of education: Not on file   Highest education level: Not on file  Occupational History   Not on file  Tobacco Use   Smoking status: Every Day    Packs/day: 0    Types: Cigarettes   Smokeless tobacco: Never  Vaping Use   Vaping Use: Never used  Substance and Sexual Activity   Alcohol use: Not Currently   Drug use: Not Currently    Types: Marijuana    Comment: denies drug use   Sexual activity: Not Currently  Other Topics Concern   Not on file  Social History Narrative   Not on file   Social Determinants of Health   Financial Resource Strain: Not on file  Food Insecurity: Not on file  Transportation Needs: Not on file  Physical Activity: Not on file  Stress: Not on file  Social Connections: Not on file    Allergies:   Allergies  Allergen Reactions   Risperidone Other (See Comments)    Patient declined to give reaction  Other Reaction(s): Other (See Comments)  Pt can not remember reaction    Labs:  Results for orders placed or performed during the hospital encounter of 07/25/22 (from the past 48 hour(s))  Rapid urine drug screen (hospital performed)     Status: None   Collection Time: 07/25/22  8:06 PM  Result Value Ref Range   Opiates NONE DETECTED NONE DETECTED   Cocaine NONE DETECTED NONE DETECTED   Benzodiazepines NONE DETECTED NONE DETECTED   Amphetamines NONE DETECTED NONE DETECTED   Tetrahydrocannabinol NONE DETECTED NONE DETECTED   Barbiturates NONE DETECTED NONE DETECTED    Comment: (NOTE) DRUG SCREEN FOR MEDICAL PURPOSES ONLY.  IF CONFIRMATION IS NEEDED FOR ANY PURPOSE, NOTIFY LAB WITHIN 5 DAYS.  LOWEST DETECTABLE LIMITS FOR URINE DRUG SCREEN Drug Class                     Cutoff (ng/mL) Amphetamine and metabolites    1000 Barbiturate and metabolites    200 Benzodiazepine                 200 Opiates and metabolites         300 Cocaine and metabolites        300 THC                            50 Performed at [facility], [place] [address], [place], [place] [address]   Ethanol     Status: None   Collection Time: 07/25/22  8:22 PM  Result Value Ref Range   Alcohol, Ethyl (B) <10 <  10 mg/dL    Comment: (NOTE) Lowest detectable limit for serum alcohol is 10 mg/dL.  For medical purposes only. Performed at [facility], [place] [address], [place], [place] [address]   CBC with Differential     Status: Abnormal   Collection Time: 07/25/22  8:22 PM  Result Value Ref Range   WBC 8.7 4.0 - 10.5 K/uL   RBC 5.40 4.22 - 5.81 MIL/uL   Hemoglobin 13.3 13.0 - 17.0 g/dL   HCT 39.6 39.0 - 52.0 %   MCV 73.3 (L) 80.0 - 100.0 fL   MCH 24.6 (L) 26.0 - 34.0 pg   MCHC 33.6 30.0 - 36.0 g/dL   RDW 15.4 11.5 - 15.5 %   Platelets 344 150 - 400 K/uL   nRBC 0.0 0.0 - 0.2 %   Neutrophils Relative % 69 %   Neutro Abs 6.0 1.7 - 7.7 K/uL   Lymphocytes Relative 21 %   Lymphs Abs 1.8 0.7 - 4.0 K/uL   Monocytes Relative 8 %   Monocytes Absolute 0.7 0.1 - 1.0 K/uL   Eosinophils Relative 1 %   Eosinophils Absolute 0.1 0.0 - 0.5 K/uL   Basophils Relative 1 %   Basophils Absolute 0.1 0.0 - 0.1 K/uL   Immature Granulocytes 0 %   Abs Immature Granulocytes 0.02 0.00 - 0.07 K/uL    Comment: Performed at [facility], [place] [address], [place], [place] [address]  Comprehensive metabolic panel     Status: Abnormal   Collection Time: 07/25/22  8:22 PM  Result Value Ref Range   Sodium 133 (L) 135 - 145 mmol/L   Potassium 4.0 3.5 - 5.1 mmol/L   Chloride 105 98 - 111 mmol/L   CO2 21 (L) 22 - 32 mmol/L   Glucose, Bld 83 70 - 99 mg/dL    Comment: Glucose reference range applies only to samples taken after fasting for at least 8 hours.   BUN 24 (H) 6 - 20 mg/dL   Creatinine, Ser 0.78 0.61 - 1.24 mg/dL   Calcium 9.0 8.9 - 10.3 mg/dL   Total Protein 7.8 6.5 - 8.1 g/dL   Albumin 4.3 3.5 -  5.0 g/dL   AST 40 15 - 41 U/L   ALT 29 0 - 44 U/L   Alkaline Phosphatase 50 38 - 126 U/L   Total Bilirubin 0.8 0.3 - 1.2 mg/dL   GFR, Estimated >60 >60 mL/min    Comment: (NOTE) Calculated using the CKD-EPI Creatinine Equation (2021)    Anion gap 7 5 - 15    Comment: Performed at [facility], [place] [address], [place], [place] [address]    Current Facility-Administered Medications  Medication Dose Route Frequency Provider Last Rate Last Admin   acetaminophen (TYLENOL) tablet 650 mg  650 mg Oral Q4H PRN Elgie Congo, MD       ondansetron [facility]) tablet 4 mg  4 mg Oral Q8H PRN Elgie Congo, MD       Current Outpatient Medications  Medication Sig Dispense Refill   ARIPiprazole ER (ABILIFY MAINTENA) 400 MG PRSY prefilled syringe Inject 400 mg into the muscle every 28 (twenty-eight) days. Please bring this medication to your scheduled appointment at [facility] on 07/25/2022 at 2:00 PM 1 each 0    Musculoskeletal: Strength & Muscle Tone: within normal limits Gait & Station: normal Patient leans: N/A   Psychiatric Specialty Exam: Presentation  General Appearance:  Appropriate for Environment  Eye  Contact: Fleeting  Speech: Clear and Coherent  Speech Volume: Normal  Handedness: Right   Mood and Affect  Mood: Anxious; Euthymic  Affect: Appropriate; Flat   Thought Process  Thought Processes: Coherent  Descriptions of Associations:Loose  Orientation:Full (Time, Place and Person)  Thought Content:Scattered  History of Schizophrenia/Schizoaffective disorder:Yes  Duration of Psychotic Symptoms:No data recorded Hallucinations:Hallucinations: None  Ideas of Reference:None  Suicidal Thoughts:Suicidal Thoughts: No  Homicidal Thoughts:Homicidal Thoughts: No   Sensorium  Memory: Immediate Fair; Remote Fair  Judgment: Fair  Insight: Fair   Community education officer  Concentration: Fair  Attention  Span: Fair  Recall: [employer] of Knowledge: Fair  Language: Fair   Psychomotor Activity  Psychomotor Activity: Psychomotor Activity: Normal   Assets  Assets: Communication Skills; Social Support    Sleep  Sleep: Sleep: Fair   Physical Exam: Physical Exam Eyes:     Pupils: Pupils are equal, round, and reactive to light.  Cardiovascular:     Rate and Rhythm: Normal rate.  Musculoskeletal:        General: Normal range of motion.  Neurological:     Mental Status: He is alert.  Psychiatric:        Attention and Perception: Attention normal.        Mood and Affect: Mood normal.        Speech: Speech normal.        Behavior: Behavior normal. Behavior is cooperative.        Thought Content: Thought content normal.        Cognition and Memory: Memory normal.    Review of Systems  Respiratory: Negative.    Skin: Negative.   Psychiatric/Behavioral: Negative.     Blood pressure (!) 140/79, pulse 67, temperature 97.9 F (36.6 C), temperature source Oral, resp. rate 16, SpO2 100 %. There is no height or weight on file to calculate BMI.  Medical Decision Making: Patient is psychiatrically cleared. At time of discharge, patient denies SI, HI, AVH and is able to contract for safety. He demonstrated no overt evidence of psychosis or mania. Prior to discharge, Patient verbalized that they understood warning signs, triggers, and symptoms of worsening mental health and how to access emergency mental health care if they felt it was needed. Patient was instructed to call 911 or return to the emergency room if they experienced any concerning symptoms after discharge. Patient voiced understanding and agreed to the above. Will put a TOC consult in, to assist with group home resources and talking with his legal guardian.    Disposition: Patient does not meet criteria for psychiatric inpatient admission. Supportive therapy provided about ongoing stressors. Discussed crisis plan,  support from social network, calling 911, coming to the Emergency Department, and calling Suicide Hotline.  Michaele Offer, PMHNP 07/26/2022 6:56 PM

## 2022-07-27 ENCOUNTER — Encounter (HOSPITAL_COMMUNITY): Payer: Self-pay

## 2022-07-27 ENCOUNTER — Other Ambulatory Visit: Payer: Self-pay

## 2022-07-27 ENCOUNTER — Emergency Department (HOSPITAL_COMMUNITY)
Admission: EM | Admit: 2022-07-27 | Discharge: 2022-07-27 | Discharge status: Against Medical Advice | Payer: No Typology Code available for payment source | Attending: Emergency Medicine | Admitting: Emergency Medicine

## 2022-07-27 DIAGNOSIS — F411 Generalized anxiety disorder: Secondary | ICD-10-CM | POA: Diagnosis not present

## 2022-07-27 DIAGNOSIS — F10120 Alcohol abuse with intoxication, uncomplicated: Secondary | ICD-10-CM | POA: Insufficient documentation

## 2022-07-27 DIAGNOSIS — Z5321 Procedure and treatment not carried out due to patient leaving prior to being seen by health care provider: Secondary | ICD-10-CM | POA: Diagnosis not present

## 2022-07-27 DIAGNOSIS — Y905 Blood alcohol level of 100-119 mg/100 ml: Secondary | ICD-10-CM | POA: Insufficient documentation

## 2022-07-27 LAB — CBC
HCT: 38.8 % — ABNORMAL LOW (ref 39.0–52.0)
Hemoglobin: 12.2 g/dL — ABNORMAL LOW (ref 13.0–17.0)
MCH: 23.5 pg — ABNORMAL LOW (ref 26.0–34.0)
MCHC: 31.4 g/dL (ref 30.0–36.0)
MCV: 74.6 fL — ABNORMAL LOW (ref 80.0–100.0)
Platelets: 300 10*3/uL (ref 150–400)
RBC: 5.2 MIL/uL (ref 4.22–5.81)
RDW: 15.1 % (ref 11.5–15.5)
WBC: 7.2 10*3/uL (ref 4.0–10.5)
nRBC: 0 % (ref 0.0–0.2)

## 2022-07-27 LAB — URINALYSIS, ROUTINE W REFLEX MICROSCOPIC
Bilirubin Urine: NEGATIVE
Glucose, UA: NEGATIVE mg/dL
Hgb urine dipstick: NEGATIVE
Ketones, ur: 5 mg/dL — AB
Leukocytes,Ua: NEGATIVE
Nitrite: NEGATIVE
Protein, ur: NEGATIVE mg/dL
Specific Gravity, Urine: 1.033 — ABNORMAL HIGH (ref 1.005–1.030)
pH: 5 (ref 5.0–8.0)

## 2022-07-27 LAB — COMPREHENSIVE METABOLIC PANEL
ALT: 28 U/L (ref 0–44)
AST: 30 U/L (ref 15–41)
Albumin: 4.2 g/dL (ref 3.5–5.0)
Alkaline Phosphatase: 42 U/L (ref 38–126)
Anion gap: 8 (ref 5–15)
BUN: 20 mg/dL (ref 6–20)
CO2: 26 mmol/L (ref 22–32)
Calcium: 9.2 mg/dL (ref 8.9–10.3)
Chloride: 104 mmol/L (ref 98–111)
Creatinine, Ser: 0.81 mg/dL (ref 0.61–1.24)
GFR, Estimated: >60 mL/min (ref >60)
Glucose, Bld: 108 mg/dL — ABNORMAL HIGH (ref 70–99)
Potassium: 4.6 mmol/L (ref 3.5–5.1)
Sodium: 138 mmol/L (ref 135–145)
Total Bilirubin: 0.6 mg/dL (ref 0.3–1.2)
Total Protein: 7.2 g/dL (ref 6.5–8.1)

## 2022-07-27 LAB — LIPASE, BLOOD: Lipase: 38 U/L (ref 11–51)

## 2022-07-27 LAB — ETHANOL: Alcohol, Ethyl (B): 109 mg/dL — ABNORMAL HIGH (ref <10)

## 2022-07-27 NOTE — ED Triage Notes (Signed)
States that he drank a lot of alcohol today and this he has alcohol poisoning

## 2022-07-27 NOTE — ED Notes (Signed)
Patient became upset when staff told him he could not have anymore food. " Ya'll are treating me like I'm a dog". Staff reminded patient of the rules. Pt. Later apologized.

## 2022-07-27 NOTE — ED Provider Notes (Addendum)
  Physical Exam  BP 127/83 (BP Location: Left Arm)   Pulse (!) 58   Temp 97.7 F (36.5 C)   Resp 16   SpO2 100%   Physical Exam  Procedures  Procedures  ED Course / MDM   Clinical Course as of 07/27/22 0740  Wed Jul 25, 2022  [facility] from poison control, recommends rpt vitals but does not seem c/w overdose from coricidin at this point. Just supportive care, no further checks. [VB]    Clinical Course User Index [VB] Elgie Congo, MD    Received care of patient from previous providers.  Please see their notes for prior history, physical and care.  Briefly this is a 32 year old male who presented with concern for Coricidin ingestion.  Has multiple psychiatric evaluations over the last few months.  He was medically cleared last night and TTS consult placed.  Seen by psychiatry and psychiatrically cleared.  TOC CSW attempted to contact legal guardians.  Prior to this he was unhoused and can return to his previous state-was provided resources.  He is familiar with [facility] and has been discharged to [facility] in past.  Also attempted to call guardian this AM.  Will discharge with resources.       Gareth Morgan, MD 07/27/22 [phone]

## 2022-07-27 NOTE — Progress Notes (Signed)
This CSW left a HIPAA Compliant voicemail informing the pt's Legal Guardian that the pt has been seen by psychiatry and is now cleared. Informed he will d/c to the [facility] which he is familiar with. No further TOC needs at this time. TOC signing off.

## 2022-08-19 ENCOUNTER — Other Ambulatory Visit: Payer: Self-pay

## 2022-08-19 ENCOUNTER — Encounter (HOSPITAL_COMMUNITY): Payer: Self-pay

## 2022-08-19 ENCOUNTER — Emergency Department (HOSPITAL_COMMUNITY)
Admission: EM | Admit: 2022-08-19 | Discharge: 2022-08-19 | Disposition: A | Discharge status: Home / Self Care | Payer: Medicaid Other | Attending: Emergency Medicine | Admitting: Emergency Medicine

## 2022-08-19 ENCOUNTER — Emergency Department (HOSPITAL_COMMUNITY)
Admission: EM | Admit: 2022-08-19 | Discharge: 2022-08-19 | Disposition: A | Discharge status: Home / Self Care | Payer: Medicaid Other | Source: Home / Self Care | Attending: Emergency Medicine | Admitting: Emergency Medicine

## 2022-08-19 DIAGNOSIS — T07XXXA Unspecified multiple injuries, initial encounter: Secondary | ICD-10-CM

## 2022-08-19 DIAGNOSIS — S60512A Abrasion of left hand, initial encounter: Secondary | ICD-10-CM | POA: Diagnosis not present

## 2022-08-19 DIAGNOSIS — S00511A Abrasion of lip, initial encounter: Secondary | ICD-10-CM | POA: Insufficient documentation

## 2022-08-19 DIAGNOSIS — T450X1A Poisoning by antiallergic and antiemetic drugs, accidental (unintentional), initial encounter: Secondary | ICD-10-CM | POA: Insufficient documentation

## 2022-08-19 DIAGNOSIS — T50901A Poisoning by unspecified drugs, medicaments and biological substances, accidental (unintentional), initial encounter: Secondary | ICD-10-CM

## 2022-08-19 LAB — COMPREHENSIVE METABOLIC PANEL
ALT: 31 U/L (ref 0–44)
AST: 37 U/L (ref 15–41)
Albumin: 3.4 g/dL — ABNORMAL LOW (ref 3.5–5.0)
Alkaline Phosphatase: 47 U/L (ref 38–126)
Anion gap: 10 (ref 5–15)
BUN: 15 mg/dL (ref 6–20)
CO2: 25 mmol/L (ref 22–32)
Calcium: 9.2 mg/dL (ref 8.9–10.3)
Chloride: 101 mmol/L (ref 98–111)
Creatinine, Ser: 0.89 mg/dL (ref 0.61–1.24)
GFR, Estimated: >60 mL/min (ref >60)
Glucose, Bld: 90 mg/dL (ref 70–99)
Potassium: 4 mmol/L (ref 3.5–5.1)
Sodium: 136 mmol/L (ref 135–145)
Total Bilirubin: 0.5 mg/dL (ref 0.3–1.2)
Total Protein: 6.5 g/dL (ref 6.5–8.1)

## 2022-08-19 LAB — CBC WITH DIFFERENTIAL/PLATELET
Abs Immature Granulocytes: 0.02 10*3/uL (ref 0.00–0.07)
Basophils Absolute: 0.1 10*3/uL (ref 0.0–0.1)
Basophils Relative: 1 %
Eosinophils Absolute: 0.2 10*3/uL (ref 0.0–0.5)
Eosinophils Relative: 3 %
HCT: 33.9 % — ABNORMAL LOW (ref 39.0–52.0)
Hemoglobin: 11 g/dL — ABNORMAL LOW (ref 13.0–17.0)
Immature Granulocytes: 0 %
Lymphocytes Relative: 24 %
Lymphs Abs: 2.1 10*3/uL (ref 0.7–4.0)
MCH: 23.8 pg — ABNORMAL LOW (ref 26.0–34.0)
MCHC: 32.4 g/dL (ref 30.0–36.0)
MCV: 73.2 fL — ABNORMAL LOW (ref 80.0–100.0)
Monocytes Absolute: 0.8 10*3/uL (ref 0.1–1.0)
Monocytes Relative: 9 %
Neutro Abs: 5.3 10*3/uL (ref 1.7–7.7)
Neutrophils Relative %: 63 %
Platelets: 408 10*3/uL — ABNORMAL HIGH (ref 150–400)
RBC: 4.63 MIL/uL (ref 4.22–5.81)
RDW: 16.2 % — ABNORMAL HIGH (ref 11.5–15.5)
WBC: 8.5 10*3/uL (ref 4.0–10.5)
nRBC: 0 % (ref 0.0–0.2)

## 2022-08-19 LAB — ETHANOL: Alcohol, Ethyl (B): <10 mg/dL (ref <10)

## 2022-08-19 LAB — RAPID URINE DRUG SCREEN, HOSP PERFORMED
Amphetamines: NOT DETECTED
Barbiturates: NOT DETECTED
Benzodiazepines: NOT DETECTED
Cocaine: NOT DETECTED
Opiates: NOT DETECTED
Tetrahydrocannabinol: NOT DETECTED

## 2022-08-19 LAB — CBG MONITORING, ED: Glucose-Capillary: 94 mg/dL (ref 70–99)

## 2022-08-19 LAB — SALICYLATE LEVEL: Salicylate Lvl: <7 mg/dL — ABNORMAL LOW (ref 7.0–30.0)

## 2022-08-19 LAB — ACETAMINOPHEN LEVEL: Acetaminophen (Tylenol), Serum: <10 ug/mL — ABNORMAL LOW (ref 10–30)

## 2022-08-19 MED ORDER — LACTATED RINGERS IV BOLUS
1000.0000 mL | Freq: Once | INTRAVENOUS | Status: AC
  Administered 2022-08-19: 1000 mL via INTRAVENOUS
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
## 2022-08-19 NOTE — ED Provider Notes (Signed)
[facility]EMERGENCY DEPT [facility] Emergency Department Provider Note MRN:  [id number]  Arrival date & time: 08/19/22     Chief Complaint   Ingestion   History of Present Illness   John Copeland is a 32 y.o. year-old male presents to the ED with chief complaint of overdose.  Patient states that he ingested 500 mg of hydroxyzine in an effort to sleep tonight.  States that he has been staying at the [facility] and was having difficulty sleeping.  Denies any recent illnesses.  Denies SI or HI.  He denies any other symptoms tonight.  History provided by patient.   Review of Systems  Pertinent positive and negative review of systems noted in HPI.    Physical Exam   Vitals:   08/19/22 0344 08/19/22 0352  BP:  139/80  Pulse:  79  Resp:  16  Temp:  97.8 F (36.6 C)  SpO2: 100% 100%    CONSTITUTIONAL:  well-appearing, NAD NEURO:  Alert and oriented x 3, CN 3-12 grossly intact EYES:  eyes equal and reactive ENT/NECK:  Supple, no stridor  CARDIO:  normal rate, regular rhythm, appears well-perfused  PULM:  No respiratory distress,  GI/GU:  non-distended,  MSK/SPINE:  No gross deformities, no edema, moves all extremities  SKIN:  no rash, atraumatic   *Additional and/or pertinent findings included in MDM below  Diagnostic and Interventional Summary    EKG Interpretation  Date/Time:    Ventricular Rate:    PR Interval:    QRS Duration:   QT Interval:    QTC Calculation:   R Axis:     Text Interpretation:         Labs Reviewed  COMPREHENSIVE METABOLIC PANEL - Abnormal; Notable for the following components:      Result Value   Albumin 3.4 (*)    All other components within normal limits  SALICYLATE LEVEL - Abnormal; Notable for the following components:   Salicylate Lvl <7.0 (*)    All other components within normal limits  ACETAMINOPHEN LEVEL - Abnormal; Notable for the following components:   Acetaminophen (Tylenol), Serum <10 (*)    All other components within  normal limits  CBC WITH DIFFERENTIAL/PLATELET - Abnormal; Notable for the following components:   Hemoglobin 11.0 (*)    HCT 33.9 (*)    MCV 73.2 (*)    MCH 23.8 (*)    RDW 16.2 (*)    Platelets 408 (*)    All other components within normal limits  ETHANOL  RAPID URINE DRUG SCREEN, HOSP PERFORMED  CBG MONITORING, ED    No orders to display    Medications  lactated ringers bolus 1,000 mL (1,000 mLs Intravenous New Bag/Given 08/19/22 0414)     Procedures  /  Critical Care Procedures  ED Course and Medical Decision Making  I have reviewed the triage vital signs, the nursing notes, and pertinent available records from the EMR.  Social Determinants Affecting Complexity of Care: Patient has decreased access to medical care.   ED Course:    Medical Decision Making Amount and/or Complexity of Data Reviewed Labs: ordered.    Details: No significant electrolyte derangement No leukocytosis Normal acetaminophen, salicylate, and ethanol     Consultants: No consultations were needed in caring for this patient.   Treatment and Plan: I discussed the case again with poison control, they state that patient is cleared from their standpoint.  Labs and vitals are reassuring here.  Feel that patient can be safely discharged.  I attempted to notify patient's legal guardian, but was unable to reach them.  I left a voicemail letting them know of her discharge plan.    Final Clinical Impressions(s) / ED Diagnoses     ICD-10-CM   1. Accidental overdose, initial encounter  T50.901A       ED Discharge Orders     None         Discharge Instructions Discussed with and Provided to Patient:   Discharge Instructions   None      Roxy Horseman, PA-C 08/19/22 7078    Sabas Sous, MD 08/20/22 [phone]
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
## 2022-08-19 NOTE — ED Triage Notes (Signed)
Pt took 500 mg hydroxyzine (10 50mg  tabs) in an effort to sleep, denies SI. AAOX4   EMS called poison control (Patty) States risks for QT prolongation, hypotension, and seizures.  Hx of seizures noncompliant with meds.   78 HR 142/104 100% RA CBG 124

## 2022-08-20 ENCOUNTER — Emergency Department (HOSPITAL_COMMUNITY)
Admission: EM | Admit: 2022-08-20 | Discharge: 2022-08-20 | Disposition: A | Discharge status: Rehabilitation Facility | Payer: Medicaid Other | Attending: Emergency Medicine | Admitting: Emergency Medicine

## 2022-08-20 ENCOUNTER — Ambulatory Visit (HOSPITAL_COMMUNITY)
Admission: EM | Admit: 2022-08-20 | Discharge: 2022-08-20 | Disposition: A | Discharge status: Other Acute Inpatient Hospital | Payer: No Typology Code available for payment source | Attending: Licensed Clinical Social Worker | Admitting: Licensed Clinical Social Worker

## 2022-08-20 ENCOUNTER — Encounter (HOSPITAL_COMMUNITY): Payer: Self-pay | Admitting: Behavioral Health

## 2022-08-20 ENCOUNTER — Ambulatory Visit (HOSPITAL_COMMUNITY)
Admission: EM | Admit: 2022-08-20 | Discharge: 2022-08-20 | Disposition: A | Discharge status: Home / Self Care | Payer: No Typology Code available for payment source

## 2022-08-20 ENCOUNTER — Emergency Department (HOSPITAL_COMMUNITY)
Admission: EM | Admit: 2022-08-20 | Discharge: 2022-08-21 | Disposition: A | Discharge status: Rehabilitation Facility | Payer: Medicaid Other | Source: Home / Self Care | Attending: Emergency Medicine | Admitting: Emergency Medicine

## 2022-08-20 ENCOUNTER — Other Ambulatory Visit: Payer: Self-pay

## 2022-08-20 ENCOUNTER — Encounter (HOSPITAL_COMMUNITY): Payer: Self-pay

## 2022-08-20 DIAGNOSIS — Z1152 Encounter for screening for COVID-19: Secondary | ICD-10-CM | POA: Insufficient documentation

## 2022-08-20 DIAGNOSIS — R7309 Other abnormal glucose: Secondary | ICD-10-CM | POA: Insufficient documentation

## 2022-08-20 DIAGNOSIS — T485X2A Poisoning by other anti-common-cold drugs, intentional self-harm, initial encounter: Secondary | ICD-10-CM | POA: Insufficient documentation

## 2022-08-20 DIAGNOSIS — T65894A Toxic effect of other specified substances, undetermined, initial encounter: Secondary | ICD-10-CM | POA: Insufficient documentation

## 2022-08-20 DIAGNOSIS — R4587 Impulsiveness: Secondary | ICD-10-CM | POA: Diagnosis not present

## 2022-08-20 DIAGNOSIS — T50902A Poisoning by unspecified drugs, medicaments and biological substances, intentional self-harm, initial encounter: Secondary | ICD-10-CM

## 2022-08-20 DIAGNOSIS — F250 Schizoaffective disorder, bipolar type: Secondary | ICD-10-CM | POA: Diagnosis not present

## 2022-08-20 DIAGNOSIS — Z5900 Homelessness unspecified: Secondary | ICD-10-CM | POA: Diagnosis not present

## 2022-08-20 DIAGNOSIS — R45851 Suicidal ideations: Secondary | ICD-10-CM

## 2022-08-20 DIAGNOSIS — H5713 Ocular pain, bilateral: Secondary | ICD-10-CM | POA: Diagnosis present

## 2022-08-20 DIAGNOSIS — R059 Cough, unspecified: Secondary | ICD-10-CM | POA: Diagnosis not present

## 2022-08-20 LAB — COMPREHENSIVE METABOLIC PANEL
ALT: 29 U/L (ref 0–44)
AST: 34 U/L (ref 15–41)
Albumin: 3.9 g/dL (ref 3.5–5.0)
Alkaline Phosphatase: 56 U/L (ref 38–126)
Anion gap: 9 (ref 5–15)
BUN: 16 mg/dL (ref 6–20)
CO2: 23 mmol/L (ref 22–32)
Calcium: 9.4 mg/dL (ref 8.9–10.3)
Chloride: 107 mmol/L (ref 98–111)
Creatinine, Ser: 0.86 mg/dL (ref 0.61–1.24)
GFR, Estimated: >60 mL/min (ref >60)
Glucose, Bld: 77 mg/dL (ref 70–99)
Potassium: 4.2 mmol/L (ref 3.5–5.1)
Sodium: 139 mmol/L (ref 135–145)
Total Bilirubin: 0.6 mg/dL (ref 0.3–1.2)
Total Protein: 7.6 g/dL (ref 6.5–8.1)

## 2022-08-20 LAB — CBC
HCT: 36.4 % — ABNORMAL LOW (ref 39.0–52.0)
Hemoglobin: 11.6 g/dL — ABNORMAL LOW (ref 13.0–17.0)
MCH: 23 pg — ABNORMAL LOW (ref 26.0–34.0)
MCHC: 31.9 g/dL (ref 30.0–36.0)
MCV: 72.2 fL — ABNORMAL LOW (ref 80.0–100.0)
Platelets: 411 10*3/uL — ABNORMAL HIGH (ref 150–400)
RBC: 5.04 MIL/uL (ref 4.22–5.81)
RDW: 16.4 % — ABNORMAL HIGH (ref 11.5–15.5)
WBC: 7.8 10*3/uL (ref 4.0–10.5)
nRBC: 0 % (ref 0.0–0.2)

## 2022-08-20 LAB — ETHANOL: Alcohol, Ethyl (B): <10 mg/dL (ref <10)

## 2022-08-20 LAB — ACETAMINOPHEN LEVEL: Acetaminophen (Tylenol), Serum: <10 ug/mL — ABNORMAL LOW (ref 10–30)

## 2022-08-20 LAB — RAPID URINE DRUG SCREEN, HOSP PERFORMED
Amphetamines: NOT DETECTED
Barbiturates: NOT DETECTED
Benzodiazepines: NOT DETECTED
Cocaine: NOT DETECTED
Opiates: NOT DETECTED
Tetrahydrocannabinol: NOT DETECTED

## 2022-08-20 LAB — SARS CORONAVIRUS 2 BY RT PCR: SARS Coronavirus 2 by RT PCR: NEGATIVE

## 2022-08-20 LAB — SALICYLATE LEVEL: Salicylate Lvl: <7 mg/dL — ABNORMAL LOW (ref 7.0–30.0)

## 2022-08-20 MED ORDER — LORAZEPAM 1 MG PO TABS
2.0000 mg | ORAL_TABLET | Freq: Once | ORAL | Status: AC
  Administered 2022-08-20: 2 mg via ORAL
  Filled 2022-08-20: qty 2

## 2022-08-20 NOTE — ED Notes (Signed)
Sitter ambulated pt to purple zone to shower.
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
## 2022-08-20 NOTE — ED Notes (Signed)
Patient transferred  via stretcher with [facility].  Respirations equal and unlabored, skin warm and dry. No acute distress noted.Belongings given to EMS staff.
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
## 2022-08-20 NOTE — ED Notes (Signed)
Report called to Chestine Spore, RN a [facility] and [facility] communication called for transport non-emergent to [facility].
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
## 2022-08-20 NOTE — Progress Notes (Signed)
   08/20/22 1608  Spiritual Encounters  Type of Visit Initial  Care provided to: Patient  Referral source Other (comment) (Page)  Reason for visit Urgent spiritual support  OnCall Visit No  Spiritual Framework  Presenting Themes Goals in life/care;Values and beliefs  Patient Stress Factors Not reviewed  Interventions  Spiritual Care Interventions Made Established relationship of care and support;Compassionate presence;Prayer  Intervention Outcomes  Outcomes Connection to spiritual care;Awareness of support   Chaplain Tiburcio Pea responded to a page from the ED. Staff member stated that the patient was "insisting" on having a Bible. Chaplain provided a Bible to the patient and prayed with him.

## 2022-08-20 NOTE — ED Provider Notes (Signed)
[place] EMERGENCY DEPARTMENT AT [facility] Provider Note   CSN: [id number] Arrival date & time: 08/20/22  1403     History Chief Complaint  Patient presents with   Psychiatric Evaluation    HPI John Copeland is a 32 y.o. male presenting for medical clearance.  He is a 32 year old male with an extensive psychiatric history has been in and out of the hospital for extensive complaints.  Allegedly consumed up to 2 bottles of cough suppressants this morning and a suicide attempt.  Presented to behavioral health urgent care who recommend he come here for medical clearance but ultimately per their documentation he will require further psychiatric hospitalization.  Patient's recorded medical, surgical, social, medication list and allergies were reviewed in the Snapshot window as part of the initial history.   Review of Systems   Review of Systems  Constitutional:  Negative for chills and fever.  HENT:  Negative for ear pain and sore throat.   Eyes:  Negative for pain and visual disturbance.  Respiratory:  Negative for cough and shortness of breath.   Cardiovascular:  Negative for chest pain and palpitations.  Gastrointestinal:  Negative for abdominal pain and vomiting.  Genitourinary:  Negative for dysuria and hematuria.  Musculoskeletal:  Negative for arthralgias and back pain.  Skin:  Negative for color change and rash.  Neurological:  Negative for seizures and syncope.  Psychiatric/Behavioral:  Positive for suicidal ideas. The patient is nervous/anxious.   All other systems reviewed and are negative.   Physical Exam Updated Vital Signs BP (!) 158/96 (BP Location: Right Arm)   Pulse (!) 102   Temp 98.4 F (36.9 C) (Oral)   Resp 18   Ht 6' (1.829 m)   Wt 81.2 kg   SpO2 96%   BMI 24.28 kg/m  Physical Exam Vitals and nursing note reviewed.  Constitutional:      General: He is not in acute distress.    Appearance: He is well-developed.  HENT:     Head:  Normocephalic and atraumatic.  Eyes:     Conjunctiva/sclera: Conjunctivae normal.  Cardiovascular:     Rate and Rhythm: Normal rate and regular rhythm.     Heart sounds: No murmur heard. Pulmonary:     Effort: Pulmonary effort is normal. No respiratory distress.     Breath sounds: Normal breath sounds.  Abdominal:     Palpations: Abdomen is soft.     Tenderness: There is no abdominal tenderness.  Musculoskeletal:        General: No swelling.     Cervical back: Neck supple.  Skin:    General: Skin is warm and dry.     Capillary Refill: Capillary refill takes less than 2 seconds.  Neurological:     Mental Status: He is alert.  Psychiatric:        Mood and Affect: Mood normal.      ED Course/ Medical Decision Making/ A&P    Procedures Procedures   Medications Ordered in ED Medications - No data to display Medical Decision Making:   John Copeland is a 32 y.o. male who presented to the ED today for psychiatric evaluation.  Patient is endorsing ongoing SI with plan to OD.  Patient does have a history of multiple psychiatric complaints.  They are not compliant with their medications.  On my initial exam, the pt was linear in thought, appropriate in affect, and overall well-appearing.  Vital signs reviewed and reassuring.  Reviewed and confirmed nursing documentation  for past medical history, family history, social history.     Initial Assessment:   This is most consistent with an acute life threatening illness. With the patient's presentation of SI, patient warrants emergent psychiatric consultation.  Differential includes primary psychosis, substance-induced psychosis, mood disturbance. Concerning their overdose, has already been approximately 12 hours, patient has no changes on laboratory evaluation/EKG.  She is medically cleared for psychiatric evaluation Initial Plan:  Patient immediately placed into ED psychiatric hold protocol including suicide precautions,  elopement precautions and vital sign monitoring.    Emergent behavioral health hold signed and notarized while awaiting psychiatric consultation due to threat to self or others.  Psychiatry consulted for further evaluation once patient medically cleared.  Medical screening evaluation ordered and reviewed with no obvious medical reason to postpone psychiatric evaluation.  Patient is voluntary at this time.  No IVC.  May need to be reassessed if capacity is changing.     Final Assessment and Plan:   Psychiatry recommendations are pending at time of handoff.  Patient placed on a psychiatric hold protocol.  Clinical Impression:  1. Suicidal ideation      Data Unavailable   Final Clinical Impression(s) / ED Diagnoses Final diagnoses:  Suicidal ideation    Rx / DC Orders ED Discharge Orders     None         Glyn Ade, MD 08/20/22 1735
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
## 2022-08-20 NOTE — Discharge Instructions (Addendum)
  Discharge recommendations:  Patient is to take medications as prescribed. Please see information for follow-up appointment with psychiatry and therapy. Please follow up with your primary care provider for all medical related needs.  Patient can follow up with [facility] outpatient walk in clinic for medication management and outpatient therapy services.  Therapy: We recommend that patient participate in individual therapy to address mental health concerns.  Medications: The patient or guardian is to contact a medical professional and/or outpatient provider to address any new side effects that develop. The patient or guardian should update outpatient providers of any new medications and/or medication changes.   Atypical antipsychotics: If you are prescribed an atypical antipsychotic, it is recommended that your height, weight, BMI, blood pressure, fasting lipid panel, and fasting blood sugar be monitored by your outpatient providers.  Safety:  The patient should abstain from use of illicit substances/drugs and abuse of any medications. If symptoms worsen or do not continue to improve or if the patient becomes actively suicidal or homicidal then it is recommended that the patient return to the closest hospital emergency department, the [facility], or call 911 for further evaluation and treatment. National Suicide Prevention Lifeline 1-800-SUICIDE or 321-246-1306.  About 988 988 offers 24/7 access to trained crisis counselors who can help people experiencing mental health-related distress. People can call or text 988 or chat 988lifeline.org for themselves or if they are worried about a loved one who may need crisis support.  Crisis Mobile: [facility] [facility]:                     (228)063-8118 (for crisis response 24 hours a day) [facility] Hotline:                                            (226)708-8486

## 2022-08-20 NOTE — ED Notes (Signed)
States " I lied I do want to die but I want to go to heaven."  Two bottles of cough medication taken from him as he was trying to open them in triage and take them.

## 2022-08-20 NOTE — Progress Notes (Signed)
LCSW Progress Note  [id number]   John Copeland  08/20/2022  3:52 PM  Description:   Inpatient Psychiatric Referral  Patient was recommended inpatient per Erskine Emery, NP. There are no available beds at [facility]. Patient was referred to the following facilities:   Destination  Service Provider Address Phone Fax  [facility]  [address], [place] [address] [phone]  [facility]  [address] [place] [address] [phone]  [facility] [address]  [address] [place] [address] [phone]  [facility] [address] [place]  [address] [place] [address] [phone]  [facility]  [address] [place], [place] [address] [phone]  [facility]  [address] [place] [address] [phone]  [facility]  [address] [place] [address] [phone]  [facility]  [address] [place] [address] [phone]  [facility]  [address] [place], [place] [address] [phone]  [facility]  [address] [place] [address] [phone]  [facility]  [address], [place] [address] [phone]  [facility]  [address] [place] [address] [phone]  [facility]  [address] [place] [address] [phone]  [facility]  [address], [place] [address] [phone]  [facility]  [address] [place] [address] [phone]  [facility]  [address] [place] [address] [phone]  [facility]  [address] [place] [address] [phone]  [facility]  [address] [place] [address] [phone]  [facility]  [address], [place] [address] [phone]  [facility]  [address], [place] [address] [phone]  [facility]  [address] [place] [address] [phone]  [facility]  [address], [place] [address] [phone]  [facility]  [address] [place], [place] [address] [phone]  [facility]  [address] [place] [address] [phone]    Situation ongoing, CSW to continue following and update chart as more information becomes available.      Cathie Beams, Theresia Majors  08/20/2022 3:52 PM
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
## 2022-08-20 NOTE — ED Notes (Signed)
Pt eyes irrigated with water in [facility]
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
## 2022-08-20 NOTE — ED Notes (Signed)
RN assisted pt to restroom. 

## 2022-08-20 NOTE — Progress Notes (Addendum)
Pt was accepted to [facility] TOMORROW 08/21/22 PENDING signed consent from pt's Legal Guardian to be faxed to [phone].  Pt meets inpatient criteria per Erskine Emery, NP  Attending Physician will be Dr Loni Beckwith  Report can be called to:[phone]-Pager number, please leave a returned phone number to receive a phone call back.   Pt can arrive after 9:00am  Care Team Notified: [facility] Rona Ravens, RN, Lovie Macadamia, RN , Eligha Bridegroom, NP, Erskine Emery, NP   Kelton Pillar, LCSWA 08/20/2022 @ 6:44 PM

## 2022-08-20 NOTE — ED Notes (Signed)
Pt provided bag lunch and beverage.  Pt given crayons and coloring pages while a Bible is located.

## 2022-08-20 NOTE — Consult Note (Signed)
Patient sent from [facility] for medical clearance after suicide attempt of ingestion of cough medicine this morning. Erskine Emery NP stated upon medical clearance patient can return to [facility] while he awaits psychiatric inpatient treatment placement.   Dr. Doran Durand notified that after medical clearance pt can transfer back to [facility]. Will update CSW and [facility] as well to help coordinate this transition.
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
## 2022-08-21 LAB — CBG MONITORING, ED: Glucose-Capillary: 119 mg/dL — ABNORMAL HIGH (ref 70–99)

## 2022-08-21 MED ORDER — OLANZAPINE 5 MG PO TBDP
5.0000 mg | ORAL_TABLET | Freq: Three times a day (TID) | ORAL | Status: DC | PRN
  Filled 2022-08-21: qty 1

## 2022-08-21 MED ORDER — ZIPRASIDONE MESYLATE 20 MG IM SOLR: 20.0000 mg | INTRAMUSCULAR | Status: DC | PRN

## 2022-08-21 MED ORDER — LORAZEPAM 1 MG PO TABS
1.0000 mg | ORAL_TABLET | ORAL | Status: AC | PRN
  Administered 2022-08-21: 1 mg via ORAL
  Filled 2022-08-21: qty 1

## 2022-08-21 NOTE — ED Notes (Signed)
Pt is resting on gurney at this time, respirations are spontaneous, even, unlabored and symmetrical bilaterally. Pt skin tone is appropriate for ethnicity, dry and warm.

## 2022-08-21 NOTE — ED Notes (Signed)
PT well appearing upon transport to [place]. All belongings and paperwork sent with safe transport driver.

## 2022-08-21 NOTE — ED Provider Notes (Signed)
Emergency Medicine Observation Re-evaluation Note  John Copeland is a 32 y.o. male, seen on rounds today.  Pt initially presented to the ED for complaints of Psychiatric Evaluation after SI attempt by drinking 2 bottles of cough suppressants Currently, the patient is not having any acute complaints.  Physical Exam  BP 139/84 (BP Location: Right Arm)   Pulse 60   Temp 97.9 F (36.6 C) (Oral)   Resp 16   Ht 6' (1.829 m)   Wt 81.2 kg   SpO2 100%   BMI 24.28 kg/m  Physical Exam General: Resting comfortably in stretcher Lungs: Normal work of breathing Psych: Calm  ED Course / MDM  EKG:EKG Interpretation  Date/Time:  Monday August 20 2022 19:00:25 EDT Ventricular Rate:  83 PR Interval:  156 QRS Duration: 94 QT Interval:  346 QTC Calculation: 406 R Axis:   72 Text Interpretation: Normal sinus rhythm Abnormal QRS-T angle, consider primary T wave abnormality Abnormal ECG When compared with ECG of 19-Aug-2022 03:45, PREVIOUS ECG IS PRESENT Confirmed by Blane Ohara [id number]) on 08/20/2022 7:18:09 PM  I have reviewed the labs performed to date as well as medications administered while in observation.  Recent changes in the last 24 hours include accepted to [facility] with pending transfer today.  Plan  Current plan is for placement to [facility] with anticipated transfer today.    Rondel Baton, MD 08/21/22 [phone]

## 2022-08-21 NOTE — Progress Notes (Addendum)
ADDENDUM  12:20 PM - CSW faxed signed voluntary consents to [facility] [phone] for admission to inpatient treatment today 08/21/2022   CSW spoke with pt's Legal Guardian, [name] [phone], via phone call at 11:22 AM. Pt's Legal Guardian gave verbal consent for treatment and transportation to [facility] today 08/21/2022. CSW then spoke with intake coordinator, Jerrol Banana, at [facility] via phone call at 11:26 AM. Jerrol Banana informed CSW that [facility] cannot accept verbal consents for treatment. CSW then sent email to pt's LG, [name] ([email or web address]) with attached HH consents for her to sign and email back to CSW. CSW to await email or call back from pt's LG.  Cathie Beams, [place]  08/21/2022 11:45 AM

## 2022-08-21 NOTE — ED Notes (Signed)
Pt is resting on gurney at this time, respirations are spontaneous, even, unlabored and symmetrical bilaterally. Pt skin tone is appropriate for ethnicity, dry and warm. 

## 2022-08-21 NOTE — ED Notes (Signed)
NAD noted, respirations are equal bilaterally and unlabored at this time. Pt resting in gurney and denies any unmet needs.  

## 2022-08-21 NOTE — ED Notes (Signed)
This RN assumed care of patient and received off going transfer of care report from off going RN. Pt is resting on gurney at this time, respirations are spontaneous, even, unlabored and symmetrical bilaterally. Pt skin tone is appropriate for ethnicity, dry and warm. Sitter at bedside.  

## 2022-08-21 NOTE — ED Provider Notes (Signed)
Patient discharged prior to my reevaluation.   Rondel Baton, MD 08/21/22 1556

## 2022-08-21 NOTE — Progress Notes (Signed)
   08/21/22 1500  Spiritual Encounters  Type of Visit Attempt (pt unavailable)   Ch responded to page from ED. When Ch arrived pt had already been discharged. No follow-up needed at this time.

## 2022-08-21 NOTE — ED Notes (Signed)
PT becoming agitated wanting to walk around, eat, drink, speak with a chaplin, etc. This RN redirected pt and he went back into room. Pt stated he wants to "break this [pointing to the hand sanitizer dispenser] off the wall and throw things". Pt was redirectable.

## 2023-05-15 DEATH — deceased
